# Patient Record
Sex: Male | Born: 1955
Health system: Southern US, Community
[De-identification: ages and names within clinical notes are randomized; demographics above are authoritative.]

## PROBLEM LIST (undated history)

## (undated) DIAGNOSIS — M109 Gout, unspecified: Secondary | ICD-10-CM

## (undated) DIAGNOSIS — K5790 Diverticulosis of intestine, part unspecified, without perforation or abscess without bleeding: Secondary | ICD-10-CM

## (undated) DIAGNOSIS — Z6841 Body Mass Index (BMI) 40.0 and over, adult: Secondary | ICD-10-CM

## (undated) DIAGNOSIS — R001 Bradycardia, unspecified: Secondary | ICD-10-CM

## (undated) DIAGNOSIS — I493 Ventricular premature depolarization: Secondary | ICD-10-CM

## (undated) DIAGNOSIS — I2510 Atherosclerotic heart disease of native coronary artery without angina pectoris: Secondary | ICD-10-CM

## (undated) DIAGNOSIS — D649 Anemia, unspecified: Secondary | ICD-10-CM

## (undated) DIAGNOSIS — H269 Unspecified cataract: Secondary | ICD-10-CM

## (undated) DIAGNOSIS — N521 Erectile dysfunction due to diseases classified elsewhere: Secondary | ICD-10-CM

## (undated) DIAGNOSIS — G5601 Carpal tunnel syndrome, right upper limb: Secondary | ICD-10-CM

## (undated) DIAGNOSIS — F1010 Alcohol abuse, uncomplicated: Secondary | ICD-10-CM

## (undated) DIAGNOSIS — I472 Ventricular tachycardia: Secondary | ICD-10-CM

## (undated) DIAGNOSIS — J45909 Unspecified asthma, uncomplicated: Secondary | ICD-10-CM

## (undated) DIAGNOSIS — I4729 Other ventricular tachycardia: Secondary | ICD-10-CM

## (undated) DIAGNOSIS — I10 Essential (primary) hypertension: Secondary | ICD-10-CM

## (undated) DIAGNOSIS — G4733 Obstructive sleep apnea (adult) (pediatric): Secondary | ICD-10-CM

## (undated) DIAGNOSIS — N289 Disorder of kidney and ureter, unspecified: Secondary | ICD-10-CM

## (undated) DIAGNOSIS — M86659 Other chronic osteomyelitis, unspecified thigh: Secondary | ICD-10-CM

## (undated) DIAGNOSIS — M1712 Unilateral primary osteoarthritis, left knee: Secondary | ICD-10-CM

## (undated) DIAGNOSIS — J449 Chronic obstructive pulmonary disease, unspecified: Secondary | ICD-10-CM

## (undated) DIAGNOSIS — E038 Other specified hypothyroidism: Secondary | ICD-10-CM

## (undated) DIAGNOSIS — I4819 Other persistent atrial fibrillation: Secondary | ICD-10-CM

## (undated) DIAGNOSIS — H4010X0 Unspecified open-angle glaucoma, stage unspecified: Secondary | ICD-10-CM

## (undated) DIAGNOSIS — I5032 Chronic diastolic (congestive) heart failure: Secondary | ICD-10-CM

## (undated) DIAGNOSIS — I4891 Unspecified atrial fibrillation: Secondary | ICD-10-CM

## (undated) DIAGNOSIS — G894 Chronic pain syndrome: Secondary | ICD-10-CM

## (undated) DIAGNOSIS — E291 Testicular hypofunction: Secondary | ICD-10-CM

## (undated) DIAGNOSIS — D126 Benign neoplasm of colon, unspecified: Secondary | ICD-10-CM

## (undated) DIAGNOSIS — G4730 Sleep apnea, unspecified: Secondary | ICD-10-CM

## (undated) DIAGNOSIS — M810 Age-related osteoporosis without current pathological fracture: Secondary | ICD-10-CM

## (undated) DIAGNOSIS — M86652 Other chronic osteomyelitis, left thigh: Secondary | ICD-10-CM

## (undated) DIAGNOSIS — K219 Gastro-esophageal reflux disease without esophagitis: Secondary | ICD-10-CM

## (undated) DIAGNOSIS — E785 Hyperlipidemia, unspecified: Secondary | ICD-10-CM

## (undated) DIAGNOSIS — E1140 Type 2 diabetes mellitus with diabetic neuropathy, unspecified: Secondary | ICD-10-CM

## (undated) DIAGNOSIS — K648 Other hemorrhoids: Secondary | ICD-10-CM

## (undated) DIAGNOSIS — Z79891 Long term (current) use of opiate analgesic: Secondary | ICD-10-CM

## (undated) DIAGNOSIS — M47812 Spondylosis without myelopathy or radiculopathy, cervical region: Secondary | ICD-10-CM

## (undated) DIAGNOSIS — M75101 Unspecified rotator cuff tear or rupture of right shoulder, not specified as traumatic: Secondary | ICD-10-CM

## (undated) DIAGNOSIS — M5412 Radiculopathy, cervical region: Secondary | ICD-10-CM

## (undated) DIAGNOSIS — J300 Vasomotor rhinitis: Secondary | ICD-10-CM

## (undated) DIAGNOSIS — N189 Chronic kidney disease, unspecified: Secondary | ICD-10-CM

## (undated) DIAGNOSIS — E039 Hypothyroidism, unspecified: Secondary | ICD-10-CM

## (undated) HISTORY — DX: Spondylosis without myelopathy or radiculopathy, cervical region: M47.812

## (undated) HISTORY — DX: Unspecified rotator cuff tear or rupture of right shoulder, not specified as traumatic: M75.101

## (undated) HISTORY — DX: Other persistent atrial fibrillation: I48.19

## (undated) HISTORY — DX: Sleep apnea, unspecified: G47.30

## (undated) HISTORY — DX: Atherosclerotic heart disease of native coronary artery without angina pectoris: I25.10

## (undated) HISTORY — DX: Essential (primary) hypertension: I10

## (undated) HISTORY — DX: Morbid (severe) obesity due to excess calories: E66.01

## (undated) HISTORY — DX: Diverticulosis of intestine, part unspecified, without perforation or abscess without bleeding: K57.90

## (undated) HISTORY — DX: Erectile dysfunction due to diseases classified elsewhere: N52.1

## (undated) HISTORY — DX: Gout, unspecified: M10.9

## (undated) HISTORY — DX: Benign neoplasm of colon, unspecified: D12.6

## (undated) HISTORY — DX: Unspecified atrial fibrillation: I48.91

## (undated) HISTORY — DX: Unspecified cataract: H26.9

## (undated) HISTORY — DX: Age-related osteoporosis without current pathological fracture: M81.0

## (undated) HISTORY — DX: Radiculopathy, cervical region: M54.12

## (undated) HISTORY — DX: Hyperlipidemia, unspecified: E78.5

## (undated) HISTORY — DX: Chronic diastolic (congestive) heart failure: I50.32

## (undated) HISTORY — DX: Disorder of kidney and ureter, unspecified: N28.9

## (undated) HISTORY — DX: Chronic kidney disease, unspecified: N18.9

## (undated) HISTORY — DX: Gastro-esophageal reflux disease without esophagitis: K21.9

## (undated) HISTORY — DX: Unilateral primary osteoarthritis, left knee: M17.12

## (undated) HISTORY — DX: Ventricular tachycardia: I47.2

## (undated) HISTORY — DX: Carpal tunnel syndrome, right upper limb: G56.01

## (undated) HISTORY — DX: Unspecified open-angle glaucoma, stage unspecified: H40.10X0

## (undated) HISTORY — DX: Other ventricular tachycardia: I47.29

## (undated) HISTORY — DX: Ventricular premature depolarization: I49.3

## (undated) HISTORY — DX: Type 2 diabetes mellitus with diabetic neuropathy, unspecified: E11.40

## (undated) HISTORY — PX: SHOULDER SURGERY: SHX246

## (undated) HISTORY — DX: Other chronic osteomyelitis, unspecified thigh: M86.659

## (undated) HISTORY — DX: Testicular hypofunction: E29.1

## (undated) HISTORY — DX: Chronic pain syndrome: G89.4

## (undated) HISTORY — DX: Anemia, unspecified: D64.9

## (undated) HISTORY — DX: Obstructive sleep apnea (adult) (pediatric): G47.33

## (undated) HISTORY — DX: Unspecified asthma, uncomplicated: J45.909

## (undated) HISTORY — DX: Long term (current) use of opiate analgesic: Z79.891

## (undated) HISTORY — DX: Other specified hypothyroidism: E03.8

## (undated) HISTORY — DX: Type 2 diabetes mellitus with diabetic neuropathy, unspecified: N52.1

## (undated) HISTORY — DX: Body Mass Index (BMI) 40.0 and over, adult: Z684

## (undated) HISTORY — DX: Hypothyroidism, unspecified: E03.9

## (undated) HISTORY — DX: Bradycardia, unspecified: R00.1

## (undated) HISTORY — PX: OTHER SURGICAL HISTORY: SHX169

## (undated) HISTORY — DX: Other chronic osteomyelitis, left thigh: M86.652

---
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
## 1979-05-05 HISTORY — PX: FRACTURE SURGERY: SHX138

## 2011-07-10 ENCOUNTER — Ambulatory Visit
Payer: No Typology Code available for payment source | Attending: Orthopedic Surgery | Admitting: Rehabilitative and Restorative Service Providers"

## 2011-07-10 DIAGNOSIS — M25569 Pain in unspecified knee: Secondary | ICD-10-CM | POA: Insufficient documentation

## 2011-07-10 DIAGNOSIS — R269 Unspecified abnormalities of gait and mobility: Secondary | ICD-10-CM | POA: Insufficient documentation

## 2011-07-10 DIAGNOSIS — M25669 Stiffness of unspecified knee, not elsewhere classified: Secondary | ICD-10-CM | POA: Insufficient documentation

## 2011-07-10 DIAGNOSIS — IMO0001 Reserved for inherently not codable concepts without codable children: Secondary | ICD-10-CM | POA: Insufficient documentation

## 2011-07-11 ENCOUNTER — Ambulatory Visit: Payer: No Typology Code available for payment source | Admitting: Physical Therapy

## 2011-07-23 ENCOUNTER — Ambulatory Visit: Payer: No Typology Code available for payment source | Admitting: Rehabilitation

## 2011-07-24 ENCOUNTER — Ambulatory Visit: Payer: No Typology Code available for payment source | Admitting: Rehabilitative and Restorative Service Providers"

## 2011-07-31 ENCOUNTER — Ambulatory Visit: Payer: No Typology Code available for payment source | Admitting: Rehabilitation

## 2011-08-02 ENCOUNTER — Ambulatory Visit: Payer: No Typology Code available for payment source | Admitting: Rehabilitative and Restorative Service Providers"

## 2011-09-04 DIAGNOSIS — Z87898 Personal history of other specified conditions: Secondary | ICD-10-CM | POA: Diagnosis not present

## 2011-09-11 DIAGNOSIS — I509 Heart failure, unspecified: Secondary | ICD-10-CM | POA: Diagnosis not present

## 2011-09-11 DIAGNOSIS — E291 Testicular hypofunction: Secondary | ICD-10-CM | POA: Diagnosis not present

## 2011-09-11 DIAGNOSIS — I10 Essential (primary) hypertension: Secondary | ICD-10-CM | POA: Diagnosis not present

## 2011-09-11 DIAGNOSIS — M25569 Pain in unspecified knee: Secondary | ICD-10-CM | POA: Diagnosis not present

## 2011-09-11 DIAGNOSIS — M79609 Pain in unspecified limb: Secondary | ICD-10-CM | POA: Diagnosis not present

## 2011-09-11 DIAGNOSIS — G894 Chronic pain syndrome: Secondary | ICD-10-CM | POA: Diagnosis not present

## 2011-09-11 DIAGNOSIS — G609 Hereditary and idiopathic neuropathy, unspecified: Secondary | ICD-10-CM | POA: Diagnosis not present

## 2011-09-18 ENCOUNTER — Encounter (HOSPITAL_COMMUNITY): Payer: Self-pay | Admitting: Emergency Medicine

## 2011-09-18 ENCOUNTER — Emergency Department (INDEPENDENT_AMBULATORY_CARE_PROVIDER_SITE_OTHER)
Admission: EM | Admit: 2011-09-18 | Discharge: 2011-09-18 | Disposition: A | Discharge status: Home / Self Care | Payer: Medicare Other | Source: Home / Self Care

## 2011-09-18 DIAGNOSIS — I10 Essential (primary) hypertension: Secondary | ICD-10-CM

## 2011-09-18 NOTE — ED Provider Notes (Signed)
Medical screening examination/treatment/procedure(s) were performed by non-physician practitioner and as supervising physician I was immediately available for consultation/collaboration.  Corrie Mckusick, MD 09/18/11 2057

## 2011-09-18 NOTE — ED Notes (Signed)
Pt. Stated, my Dr in [place] town said I need to see a Dr. About my BP.

## 2011-09-18 NOTE — ED Provider Notes (Signed)
History     CSN: [id number]  Arrival date & time 09/18/11  1647   First MD Initiated Contact with Patient 09/18/11 1659      Chief Complaint  Patient presents with  . Blood Pressure Check    (Consider location/radiation/quality/duration/timing/severity/associated sxs/prior treatment) Patient is a 56 y.o. male presenting with hypertension. The history is provided by the patient. No language interpreter was used.  Hypertension This is a new problem. The current episode started more than 1 week ago. The problem occurs constantly. The problem has been gradually worsening. Pertinent negatives include no chest pain, no abdominal pain, no headaches and no shortness of breath. The symptoms are aggravated by nothing. The symptoms are relieved by nothing. He has tried nothing for the symptoms. The treatment provided moderate relief.  Pt reports blood pressure has been higher this week.    Past Medical History  Diagnosis Date  . Hypertension   . Acid reflux   . Angina pectoris     No past surgical history on file.  No family history on file.  History  Substance Use Topics  . Smoking status: Not on file  . Smokeless tobacco: Not on file  . Alcohol Use:       Review of Systems  Respiratory: Negative for shortness of breath.   Cardiovascular: Negative for chest pain.  Gastrointestinal: Negative for abdominal pain.  Neurological: Negative for headaches.  All other systems reviewed and are negative.    Allergies  Altace  Home Medications   Current Outpatient Rx  Name Route Sig Dispense Refill  . ALBUTEROL SULFATE (2.5 MG/3ML) 0.083% IN NEBU Nebulization Take 2.5 mg by nebulization every 6 (six) hours as needed.    . ALLOPURINOL 100 MG PO TABS Oral Take 100 mg by mouth daily.    . ATORVASTATIN CALCIUM 40 MG PO TABS Oral Take 40 mg by mouth daily.    [name] CLONIDINE HCL 0.2 MG PO TABS Oral Take 0.2 mg by mouth 2 (two) times daily.    . COLCHICINE 0.6 MG PO TABS Oral Take 0.6 mg  by mouth daily.    [name] DICLOFENAC SODIUM 50 MG PO TBEC Oral Take 50 mg by mouth 2 (two) times daily.    [name] DIGOXIN 0.25 MG PO TABS Oral Take 250 mcg by mouth daily.    [name] ESOMEPRAZOLE MAGNESIUM 40 MG PO CPDR Oral Take 40 mg by mouth daily before breakfast.    . FUROSEMIDE 40 MG PO TABS Oral Take 40 mg by mouth daily.    [name] METOPROLOL TARTRATE 100 MG PO TABS Oral Take 100 mg by mouth 2 (two) times daily.    [name] NITROGLYCERIN 0.4 MG SL SUBL Sublingual Place 0.4 mg under the tongue every 5 (five) minutes as needed.    . OXYCODONE-ACETAMINOPHEN 10-650 MG PO TABS Oral Take 1 tablet by mouth every 6 (six) hours as needed.    . TESTOSTERONE CYPIONATE 200 MG/ML IM OIL Intramuscular Inject into the muscle every 14 (fourteen) days.    [name] VALSARTAN 80 MG PO TABS Oral Take 80 mg by mouth daily.    . WARFARIN SODIUM 6 MG PO TABS Oral Take 6 mg by mouth daily.      BP 164/97  Pulse 18  Temp(Src) 98.4 F (36.9 C) (Oral)  Resp 18  SpO2 96%  Physical Exam  Nursing note and vitals reviewed. Constitutional: He appears well-developed and well-nourished.  HENT:  Head: Normocephalic and atraumatic.  Right Ear: External ear normal.  Left Ear:  External ear normal.  Nose: Nose normal.  Eyes: Conjunctivae and EOM are normal. Pupils are equal, round, and reactive to light.  Neck: Normal range of motion. Neck supple.  Cardiovascular: Normal rate and normal heart sounds.   Pulmonary/Chest: Effort normal.  Abdominal: Soft.  Musculoskeletal: Normal range of motion.  Neurological: He is alert.  Skin: Skin is warm.  Psychiatric: He has a normal mood and affect.    ED Course  Procedures (including critical care time)  Labs Reviewed - No data to display No results found.   No diagnosis found.    MDM    Pt reports decreased activity recently,  No change in meds,  Has not missed any,  No change in diet      Langston Masker, [place] 09/18/11 1846

## 2011-09-19 ENCOUNTER — Encounter: Payer: Self-pay | Admitting: Internal Medicine

## 2011-09-20 ENCOUNTER — Encounter: Payer: Self-pay | Admitting: Cardiovascular Disease

## 2011-09-20 ENCOUNTER — Telehealth: Payer: Self-pay | Admitting: Cardiovascular Disease

## 2011-09-20 ENCOUNTER — Ambulatory Visit (INDEPENDENT_AMBULATORY_CARE_PROVIDER_SITE_OTHER): Payer: Medicare Other | Admitting: Cardiovascular Disease

## 2011-09-20 VITALS — BP 149/87 | HR 61 | Ht 71.0 in | Wt 293.0 lb

## 2011-09-20 DIAGNOSIS — L97909 Non-pressure chronic ulcer of unspecified part of unspecified lower leg with unspecified severity: Secondary | ICD-10-CM | POA: Diagnosis not present

## 2011-09-20 DIAGNOSIS — I10 Essential (primary) hypertension: Secondary | ICD-10-CM | POA: Diagnosis not present

## 2011-09-20 LAB — BASIC METABOLIC PANEL
CO2: 30 mEq/L (ref 19–32)
Calcium: 8.9 mg/dL (ref 8.4–10.5)
Chloride: 102 mEq/L (ref 96–112)
Sodium: 139 mEq/L (ref 135–145)

## 2011-09-20 MED ORDER — HYDROCHLOROTHIAZIDE 25 MG PO TABS: 25.0000 mg | ORAL_TABLET | Freq: Every day | ORAL | Status: DC

## 2011-09-20 NOTE — Patient Instructions (Signed)
Your physician wants you to follow-up in: 6 months. You will receive a reminder letter in the mail two months in advance. If you don't receive a letter, please call our office to schedule the follow-up appointment.   Your physician has recommended you make the following change in your medication: Start hydrochlorothiazide 25 mg by mouth daily.

## 2011-09-20 NOTE — Telephone Encounter (Addendum)
ROI sent to [facility] @ [phone]. 09/20/11/KM  Also faxed records to [facility]  @ [phone] 09/20/11/KM  Records Received from [facility] gave to [facility] 09/21/11/KM

## 2011-09-20 NOTE — Assessment & Plan Note (Signed)
His BP is still not controlled. This is a chronic problem followed by his cardiologist in [place], [place] and his primary care Dr. Sherril Croon in [place].  He is on multiple drugs. Diovan was increased to 160 mg po Qdaily this week in the Urgent care. I will add HCTZ 25 mg po Qdaily. Check BMET. He is a poor historian. I do not know what his cardiac conditions include. Will get records from his cardiologist in [place], [place]. If he does decide to transfer care to our office, we will need these to continue along the plan of care. ?LvEF, ?valvular disease, ? CAD.

## 2011-09-20 NOTE — Progress Notes (Signed)
History of Present Illness: 56 yo AAM with history of HTN, HLD, gout, GERD here today as a new patient for cardiac evaluation. He was seen in the [facility] on 09/18/11 for HTN. Diovan was increased to 160 mg po Qdaily and he was referred to our office for f/u He lives here and spends time with family in [place], [place] ([place]). His cardiologist is Dr. Malachi Bonds (?) in [place], [place]. He has been feeling well. No chest pain. No shortness of breath. He has occasional palpitations. This occurs several times per week for several months. He tells me that he sees his cardiologist in [place] regularly. He has had two cardiac caths in 4 years, told things were ok. ? LVEF. He is not sure. He wore a monitor last year in [place], [place].   Primary care is Dr. Alicia Amel in [place].   Past Medical History  Diagnosis Date  . Hypertension   . Acid reflux   . Angina pectoris   . Hyperlipidemia   . Asthma   . Diabetes mellitus   . Congestive heart failure     Past Surgical History  Procedure Date  . Left leg surgery   . Left arm surgery     Current Outpatient Prescriptions  Medication Sig Dispense Refill  . albuterol (PROVENTIL) (2.5 MG/3ML) 0.083% nebulizer solution Take 2.5 mg by nebulization every 6 (six) hours as needed.      [name] allopurinol (ZYLOPRIM) 100 MG tablet Take 100 mg by mouth daily.      [name] atorvastatin (LIPITOR) 40 MG tablet Take 40 mg by mouth daily.      . cloNIDine (CATAPRES) 0.2 MG tablet Take 0.2 mg by mouth 2 (two) times daily.      . colchicine 0.6 MG tablet Take 0.6 mg by mouth daily.      . diclofenac (VOLTAREN) 50 MG EC tablet Take 50 mg by mouth 2 (two) times daily.      . digoxin (LANOXIN) 0.25 MG tablet Take 250 mcg by mouth daily.      [name] esomeprazole (NEXIUM) 40 MG capsule Take 40 mg by mouth daily before breakfast.      . furosemide (LASIX) 40 MG tablet Take 40 mg by mouth daily.      . metoprolol (LOPRESSOR) 100 MG tablet Take 100 mg by mouth 2 (two)  times daily.      . nitroGLYCERIN (NITROSTAT) 0.4 MG SL tablet Place 0.4 mg under the tongue every 5 (five) minutes as needed.      [name] oxyCODONE-acetaminophen (PERCOCET) 10-650 MG per tablet Take 1 tablet by mouth every 6 (six) hours as needed.      . testosterone cypionate (DEPOTESTOTERONE CYPIONATE) 200 MG/ML injection Inject into the muscle every 14 (fourteen) days.      . valsartan (DIOVAN) 80 MG tablet Take 80 mg by mouth daily.      [name] warfarin (COUMADIN) 6 MG tablet Take 6 mg by mouth daily.        Allergies  Allergen Reactions  . Altace     History   Social History  . Marital Status: Widowed    Spouse Name: N/A    Number of Children: N/A  . Years of Education: N/A   Occupational History  . Not on file.   Social History Main Topics  . Smoking status: Never Smoker   . Smokeless tobacco: Not on file  . Alcohol Use: 1.0 oz/week    2 drink(s) per week  .  Drug Use: No  . Sexually Active: Not on file   Other Topics Concern  . Not on file   Social History Narrative  . No narrative on file    Family History  Problem Relation Age of Onset  . Heart failure Mother   . Dementia Father     Review of Systems:  As stated in the HPI and otherwise negative.   BP 149/87  Pulse 61  Ht 5\' 11"  (1.803 m)  Wt 293 lb (132.904 kg)  BMI 40.87 kg/m2  Physical Examination: General: Well developed, well nourished, NAD HEENT: OP clear, mucus membranes moist SKIN: warm, dry. No rashes. Neuro: No focal deficits Musculoskeletal: Muscle strength 5/5 all ext Psychiatric: Mood and affect normal Neck: No JVD, no carotid bruits, no thyromegaly, no lymphadenopathy. Lungs:Clear bilaterally, no wheezes, rhonci, crackles Cardiovascular: Regular rate and rhythm. No murmurs, gallops or rubs. Abdomen:Soft. Bowel sounds present. Non-tender.  Extremities: No lower extremity edema. Pulses are 2 + in the bilateral DP/PT.  EKG: NSR, rate 63 bpm. Non-specific ST and T wave changes.

## 2011-09-24 DIAGNOSIS — E7800 Pure hypercholesterolemia, unspecified: Secondary | ICD-10-CM | POA: Diagnosis not present

## 2011-09-24 DIAGNOSIS — Z125 Encounter for screening for malignant neoplasm of prostate: Secondary | ICD-10-CM | POA: Diagnosis not present

## 2011-09-24 DIAGNOSIS — E119 Type 2 diabetes mellitus without complications: Secondary | ICD-10-CM | POA: Diagnosis not present

## 2011-09-27 ENCOUNTER — Encounter: Payer: Self-pay | Admitting: *Deleted

## 2011-10-05 DIAGNOSIS — Z87898 Personal history of other specified conditions: Secondary | ICD-10-CM | POA: Diagnosis not present

## 2011-10-10 DIAGNOSIS — I10 Essential (primary) hypertension: Secondary | ICD-10-CM | POA: Diagnosis not present

## 2011-10-10 DIAGNOSIS — E291 Testicular hypofunction: Secondary | ICD-10-CM | POA: Diagnosis not present

## 2011-10-10 DIAGNOSIS — I509 Heart failure, unspecified: Secondary | ICD-10-CM | POA: Diagnosis not present

## 2011-10-10 DIAGNOSIS — Z5181 Encounter for therapeutic drug level monitoring: Secondary | ICD-10-CM | POA: Diagnosis not present

## 2011-10-10 DIAGNOSIS — E119 Type 2 diabetes mellitus without complications: Secondary | ICD-10-CM | POA: Diagnosis not present

## 2011-10-10 DIAGNOSIS — Z7901 Long term (current) use of anticoagulants: Secondary | ICD-10-CM | POA: Diagnosis not present

## 2011-10-18 DIAGNOSIS — I10 Essential (primary) hypertension: Secondary | ICD-10-CM | POA: Diagnosis not present

## 2011-10-18 DIAGNOSIS — I4891 Unspecified atrial fibrillation: Secondary | ICD-10-CM | POA: Diagnosis not present

## 2011-10-18 DIAGNOSIS — J069 Acute upper respiratory infection, unspecified: Secondary | ICD-10-CM | POA: Diagnosis not present

## 2011-10-18 DIAGNOSIS — E119 Type 2 diabetes mellitus without complications: Secondary | ICD-10-CM | POA: Diagnosis not present

## 2011-10-29 DIAGNOSIS — Z7901 Long term (current) use of anticoagulants: Secondary | ICD-10-CM | POA: Diagnosis not present

## 2011-11-02 DIAGNOSIS — Z87898 Personal history of other specified conditions: Secondary | ICD-10-CM | POA: Diagnosis not present

## 2011-11-05 DIAGNOSIS — H4011X0 Primary open-angle glaucoma, stage unspecified: Secondary | ICD-10-CM | POA: Diagnosis not present

## 2011-11-07 DIAGNOSIS — E781 Pure hyperglyceridemia: Secondary | ICD-10-CM | POA: Diagnosis not present

## 2011-11-07 DIAGNOSIS — G894 Chronic pain syndrome: Secondary | ICD-10-CM | POA: Diagnosis not present

## 2011-11-07 DIAGNOSIS — I509 Heart failure, unspecified: Secondary | ICD-10-CM | POA: Diagnosis not present

## 2011-11-07 DIAGNOSIS — M159 Polyosteoarthritis, unspecified: Secondary | ICD-10-CM | POA: Diagnosis not present

## 2011-11-07 DIAGNOSIS — E291 Testicular hypofunction: Secondary | ICD-10-CM | POA: Diagnosis not present

## 2011-11-16 DIAGNOSIS — I4891 Unspecified atrial fibrillation: Secondary | ICD-10-CM | POA: Diagnosis not present

## 2011-11-16 DIAGNOSIS — Z79899 Other long term (current) drug therapy: Secondary | ICD-10-CM | POA: Diagnosis not present

## 2011-11-26 DIAGNOSIS — I4891 Unspecified atrial fibrillation: Secondary | ICD-10-CM | POA: Diagnosis not present

## 2011-11-26 DIAGNOSIS — Z79899 Other long term (current) drug therapy: Secondary | ICD-10-CM | POA: Diagnosis not present

## 2011-11-27 DIAGNOSIS — I509 Heart failure, unspecified: Secondary | ICD-10-CM | POA: Diagnosis not present

## 2011-11-27 DIAGNOSIS — E119 Type 2 diabetes mellitus without complications: Secondary | ICD-10-CM | POA: Diagnosis not present

## 2011-11-27 DIAGNOSIS — G8921 Chronic pain due to trauma: Secondary | ICD-10-CM | POA: Diagnosis not present

## 2011-11-27 DIAGNOSIS — I4891 Unspecified atrial fibrillation: Secondary | ICD-10-CM | POA: Diagnosis not present

## 2011-11-29 ENCOUNTER — Ambulatory Visit: Payer: Medicare Other | Admitting: Physician Assistant

## 2011-12-03 DIAGNOSIS — Z87898 Personal history of other specified conditions: Secondary | ICD-10-CM | POA: Diagnosis not present

## 2011-12-04 ENCOUNTER — Encounter: Payer: Self-pay | Admitting: Physician Assistant

## 2011-12-04 ENCOUNTER — Ambulatory Visit (INDEPENDENT_AMBULATORY_CARE_PROVIDER_SITE_OTHER): Payer: Medicare Other | Admitting: Physician Assistant

## 2011-12-04 VITALS — BP 110/75 | HR 74 | Ht 71.0 in | Wt 279.4 lb

## 2011-12-04 DIAGNOSIS — N289 Disorder of kidney and ureter, unspecified: Secondary | ICD-10-CM | POA: Diagnosis not present

## 2011-12-04 DIAGNOSIS — I509 Heart failure, unspecified: Secondary | ICD-10-CM | POA: Diagnosis not present

## 2011-12-04 DIAGNOSIS — I4891 Unspecified atrial fibrillation: Secondary | ICD-10-CM | POA: Diagnosis not present

## 2011-12-04 DIAGNOSIS — I10 Essential (primary) hypertension: Secondary | ICD-10-CM

## 2011-12-04 NOTE — Progress Notes (Signed)
[address] [place], [place]  [address] Phone: [phone] Fax:  [phone]  Date:  12/04/2011   Name:  LORENA CLEARMAN       DOB:  [birth year] MRN:  [id number]  PCP:  Elizabeth Palau, NP Primary Cardiologist:  Dr. Verne Carrow  Primary Electrophysiologist:  None    History of Present Illness: KWASI JOUNG is a 56 y.o. male who returns for follow up on AFib.  He has a history of HTN, HLD, gout, GERD.  Seen by Dr. Verne Carrow as new patient in 09/2011. He was seen in the [facility] on 09/18/11 for HTN. Diovan was increased to 160 mg po Qdaily and he was referred to our office for f/u.  He lives here and spends time with family in [place], [place] ([place]  He sees a cardiologist in [place], [place]. Not sure of name.  He reports having two cardiac caths in 4 years, and told things were ok. ? LVEF. He is not sure.  He also wore a monitor last year in [place], [place].   He sees a PCP here and sees a PCP in [place], [place] ([place], [place]).  He states he has a h/o AFib and CHF.  Not sure of his LVF.  He saw Ms Dareen Piano last week and was in AFib.  He was referred back for evaluation.  He has been on coumadin for 10 years.  It sounds like he has Parox AFib.  We have never received records.  He actually sees his cardiologist in [place] in 2-3 weeks.  He notes increased DOE last week for 4 days.  He says it feels like his heart is "trying to go out of rhythm."  No chest pain.  No syncope.  No orthopnea, PND.  He has chronic LLE edema from an old injury.  He is back in NSR today.  Today, he feels back to his baseline.  Probably Class 2-2b.     Past Medical History  Diagnosis Date  . Hypertension   . Acid reflux   . Hyperlipidemia   . Asthma   . Diabetes mellitus   . Congestive heart failure   . Atrial fibrillation     coumadin managed by PCP in [facility]    Current Outpatient Prescriptions  Medication Sig Dispense  Refill  . albuterol (PROVENTIL) (2.5 MG/3ML) 0.083% nebulizer solution Take 2.5 mg by nebulization every 6 (six) hours as needed.      [name] allopurinol (ZYLOPRIM) 100 MG tablet Take 100 mg by mouth daily.      [name] atorvastatin (LIPITOR) 40 MG tablet Take 40 mg by mouth daily.      . cloNIDine (CATAPRES) 0.2 MG tablet Take 0.2 mg by mouth 2 (two) times daily.      . colchicine 0.6 MG tablet Take 0.6 mg by mouth daily.      . diclofenac (VOLTAREN) 50 MG EC tablet Take 50 mg by mouth 2 (two) times daily.      . digoxin (LANOXIN) 0.25 MG tablet Take 250 mcg by mouth daily.      [name] esomeprazole (NEXIUM) 40 MG capsule Take 40 mg by mouth daily before breakfast.      . ferrous fumarate (HEMOCYTE - 106 MG FE) 325 (106 FE) MG TABS Take 1 tablet by mouth.      . furosemide (LASIX) 40 MG tablet Take 40 mg by mouth daily.      [name] loratadine (CLARITIN) 10 MG  tablet Take 10 mg by mouth daily.      . metoprolol (LOPRESSOR) 100 MG tablet Take 100 mg by mouth 2 (two) times daily.      . misoprostol (CYTOTEC) 200 MCG tablet Take 200 mcg by mouth daily.      . nitroGLYCERIN (NITROSTAT) 0.4 MG SL tablet Place 0.4 mg under the tongue every 5 (five) minutes as needed.      [name] oxyCODONE-acetaminophen (PERCOCET) 10-650 MG per tablet Take 1 tablet by mouth every 6 (six) hours as needed.      . testosterone cypionate (DEPOTESTOTERONE CYPIONATE) 200 MG/ML injection Inject into the muscle every 14 (fourteen) days.      . valsartan (DIOVAN) 80 MG tablet Take 160 mg by mouth daily.       [name] warfarin (COUMADIN) 6 MG tablet Take 6 mg by mouth daily.        Allergies: Allergies  Allergen Reactions  . Altace   . Androgel     History  Substance Use Topics  . Smoking status: Never Smoker   . Smokeless tobacco: Not on file  . Alcohol Use: 1.0 oz/week    2 drink(s) per week     ROS:  Please see the history of present illness.   All other systems reviewed and negative.   PHYSICAL EXAM: VS:  BP 110/75  Pulse 74  Ht 5\' 11"   (1.803 m)  Wt 279 lb 6.4 oz (126.735 kg)  BMI 38.97 kg/m2 Well nourished, well developed, in no acute distress HEENT: normal Neck: no JVD Cardiac:  normal S1, S2; RRR; no murmur Lungs:  clear to auscultation bilaterally, no wheezing, rhonchi or rales Abd: soft, nontender, no hepatomegaly Ext: trace LLE edema Skin: warm and dry Neuro:  CNs 2-12 intact, no focal abnormalities noted  EKG:  NSR, HR 74, normal axis, TW inversions 2, 3, aVF, V5-6 (no change from prior).   ECG from Ms. Anderson's office reviewed and demonstrates AFib, HR 78  Labs from Ms Anderson's office 11/28/11:  BUN 25, creatinine 1.37, K 4.6, ALT 28 (HCTZ d/c'd due to elevated creatinine)  ASSESSMENT AND PLAN:  1. Atrial fibrillation  Very difficult as we do not have any records and he has, apparently, had extensive workup with a cardiologist in [place].  It sounds like he has a h/o parox AFib.  He is on coumadin.  CHADS2 score is 3.  He is in NSR today.  His rate was controlled last week.  I suspect he was symptomatic with AFib and this was why he was more SOB.  Check a TSH.  Will request records again today.  I have counseled him on the importance of getting his prior records.  He has follow up with his cardiologist in [place] later this month.  I would not change any of his Rx until we have more information.  His coumadin is managed by his PCP in [place].   2. CHF (congestive heart failure)  Not sure if this is diastolic or systolic.  Again, we need records.     3. Renal insufficiency  Check follow up bmet today.   4. HTN  Controlled.  Continue current therapy.  He can remain off of HCTZ.     Signed, Tereso Newcomer, PA-C  4:25 PM 12/04/2011

## 2011-12-04 NOTE — Patient Instructions (Signed)
Your physician recommends that you schedule a follow-up appointment in: 2 months with DR. [facility]  Your physician recommends that you return for lab work in: TODAY BMET, TSH   PLEASE CALL us TOMORROW 12/05/11 WITH THE NAME OF THE CARDIOLOGIST IN [place], [place] [phone] [address], PA-C

## 2011-12-05 ENCOUNTER — Telehealth: Payer: Self-pay | Admitting: *Deleted

## 2011-12-05 DIAGNOSIS — I10 Essential (primary) hypertension: Secondary | ICD-10-CM

## 2011-12-05 LAB — BASIC METABOLIC PANEL
BUN: 27 mg/dL — ABNORMAL HIGH (ref 6–23)
Chloride: 107 mEq/L (ref 96–112)
GFR: 54.36 mL/min — ABNORMAL LOW (ref >60.00)
Potassium: 4.8 mEq/L (ref 3.5–5.1)
Sodium: 141 mEq/L (ref 135–145)

## 2011-12-05 LAB — TSH: TSH: 2.16 u[IU]/mL (ref 0.35–5.50)

## 2011-12-05 NOTE — Telephone Encounter (Signed)
Message copied by Tarri Fuller on Wed Dec 05, 2011  5:07 PM ------      Message from: [place], [place] T      Created: Wed Dec 05, 2011  4:37 PM       Thyroid ok      Creatinine higher        - stop Voltaren        - hold Lasix         - hold Diovan        - make sure he is not taking K+        - repeat BMET in one week      Send copy of labs to PCP in [facility] [place], [place]  4:36 PM 12/05/2011

## 2011-12-05 NOTE — Telephone Encounter (Signed)
Pt notified of lab results and to d/c voltaren, to hold lasix, to hold diovan. Pt states he does not take k+ supp but does eat 2 bananas daily, I advised to pt to cut bananas down to at least 1 every other day. Pt will not be able to have repeat bmet until 4/15 due to he will be out of town all of next week. Pt gave me verbal understanding to everything today. Danielle Rankin

## 2011-12-06 ENCOUNTER — Telehealth: Payer: Self-pay | Admitting: Cardiovascular Disease

## 2011-12-06 NOTE — Telephone Encounter (Signed)
Records Were Rec Via Fx from [facility] gave to [facility] 12/06/11/Km

## 2011-12-07 DIAGNOSIS — A09 Infectious gastroenteritis and colitis, unspecified: Secondary | ICD-10-CM | POA: Diagnosis not present

## 2011-12-07 DIAGNOSIS — I4891 Unspecified atrial fibrillation: Secondary | ICD-10-CM | POA: Diagnosis not present

## 2011-12-11 DIAGNOSIS — G894 Chronic pain syndrome: Secondary | ICD-10-CM | POA: Diagnosis not present

## 2011-12-11 DIAGNOSIS — E291 Testicular hypofunction: Secondary | ICD-10-CM | POA: Diagnosis not present

## 2011-12-11 DIAGNOSIS — I10 Essential (primary) hypertension: Secondary | ICD-10-CM | POA: Diagnosis not present

## 2011-12-11 DIAGNOSIS — Z7901 Long term (current) use of anticoagulants: Secondary | ICD-10-CM | POA: Diagnosis not present

## 2011-12-11 DIAGNOSIS — I509 Heart failure, unspecified: Secondary | ICD-10-CM | POA: Diagnosis not present

## 2011-12-11 DIAGNOSIS — M159 Polyosteoarthritis, unspecified: Secondary | ICD-10-CM | POA: Diagnosis not present

## 2011-12-12 DIAGNOSIS — M25519 Pain in unspecified shoulder: Secondary | ICD-10-CM | POA: Diagnosis not present

## 2011-12-12 DIAGNOSIS — M19019 Primary osteoarthritis, unspecified shoulder: Secondary | ICD-10-CM | POA: Diagnosis not present

## 2011-12-12 DIAGNOSIS — M25819 Other specified joint disorders, unspecified shoulder: Secondary | ICD-10-CM | POA: Diagnosis not present

## 2011-12-14 DIAGNOSIS — Z7901 Long term (current) use of anticoagulants: Secondary | ICD-10-CM | POA: Diagnosis not present

## 2011-12-17 ENCOUNTER — Other Ambulatory Visit (INDEPENDENT_AMBULATORY_CARE_PROVIDER_SITE_OTHER): Payer: Medicare Other

## 2011-12-17 DIAGNOSIS — I10 Essential (primary) hypertension: Secondary | ICD-10-CM | POA: Diagnosis not present

## 2011-12-17 LAB — BASIC METABOLIC PANEL
CO2: 28 mEq/L (ref 19–32)
Chloride: 102 mEq/L (ref 96–112)
Creatinine, Ser: 1.1 mg/dL (ref 0.4–1.5)
Potassium: 3.9 mEq/L (ref 3.5–5.1)

## 2011-12-19 ENCOUNTER — Other Ambulatory Visit: Payer: Self-pay | Admitting: *Deleted

## 2011-12-19 ENCOUNTER — Telehealth: Payer: Self-pay | Admitting: *Deleted

## 2011-12-19 DIAGNOSIS — I10 Essential (primary) hypertension: Secondary | ICD-10-CM

## 2011-12-19 NOTE — Telephone Encounter (Signed)
Message copied by Tarri Fuller on Wed Dec 19, 2011  8:36 AM ------      Message from: [place], [place] T      Created: Tue Dec 18, 2011  8:22 AM       Creatinine better      Not sure how important remaining on Diovan is as we do not have records from his cardiologist in [place] (ie systolic or diastolic CHF?)      So, I would have him restart the Diovan and repeat a bmet in one week.      If he is going to remain with his cardiologist in [place], he should have this followed with them.      Are records being sent??      Tereso Newcomer, PA-C  8:22 AM 12/18/2011

## 2011-12-19 NOTE — Telephone Encounter (Signed)
pt notified of lab results and to restart diovan 160 mg daily, pt states he wants to know if he can go back on Arthrotec, says his Dr. in [place] was very upset that he was taken off of this and other meds as well, repeat bmet 4/24 here. Danielle Rankin

## 2011-12-25 DIAGNOSIS — M719 Bursopathy, unspecified: Secondary | ICD-10-CM | POA: Diagnosis not present

## 2011-12-25 DIAGNOSIS — M5030 Other cervical disc degeneration, unspecified cervical region: Secondary | ICD-10-CM | POA: Diagnosis not present

## 2011-12-25 DIAGNOSIS — M67919 Unspecified disorder of synovium and tendon, unspecified shoulder: Secondary | ICD-10-CM | POA: Diagnosis not present

## 2011-12-26 ENCOUNTER — Ambulatory Visit: Payer: Medicare Other | Attending: Sports Medicine | Admitting: Rehabilitative and Restorative Service Providers"

## 2011-12-26 ENCOUNTER — Telehealth: Payer: Self-pay | Admitting: *Deleted

## 2011-12-26 ENCOUNTER — Encounter: Payer: Self-pay | Admitting: *Deleted

## 2011-12-26 ENCOUNTER — Other Ambulatory Visit (INDEPENDENT_AMBULATORY_CARE_PROVIDER_SITE_OTHER): Payer: Medicare Other

## 2011-12-26 DIAGNOSIS — M6281 Muscle weakness (generalized): Secondary | ICD-10-CM | POA: Insufficient documentation

## 2011-12-26 DIAGNOSIS — I10 Essential (primary) hypertension: Secondary | ICD-10-CM

## 2011-12-26 DIAGNOSIS — M25519 Pain in unspecified shoulder: Secondary | ICD-10-CM | POA: Insufficient documentation

## 2011-12-26 DIAGNOSIS — IMO0001 Reserved for inherently not codable concepts without codable children: Secondary | ICD-10-CM | POA: Insufficient documentation

## 2011-12-26 LAB — BASIC METABOLIC PANEL
BUN: 11 mg/dL (ref 6–23)
CO2: 27 mEq/L (ref 19–32)
Chloride: 101 mEq/L (ref 96–112)
Glucose, Bld: 147 mg/dL — ABNORMAL HIGH (ref 70–99)
Potassium: 3.9 mEq/L (ref 3.5–5.1)
Sodium: 136 mEq/L (ref 135–145)

## 2011-12-26 NOTE — Telephone Encounter (Signed)
Message copied by Tarri Fuller on Wed Dec 26, 2011  5:17 PM ------      Message from: [place], [place] T      Created: Wed Dec 26, 2011  4:45 PM       Please notify patient that the lab results are ok.      Tereso Newcomer, PA-C  4:45 PM 12/26/2011

## 2011-12-26 NOTE — Telephone Encounter (Signed)
recording came on and said voice mail box not set up yet. I mailed out results letter today. Danielle Rankin

## 2011-12-31 ENCOUNTER — Ambulatory Visit: Payer: Medicare Other

## 2012-01-02 DIAGNOSIS — Z87898 Personal history of other specified conditions: Secondary | ICD-10-CM | POA: Diagnosis not present

## 2012-01-04 ENCOUNTER — Ambulatory Visit: Payer: Medicare Other | Attending: Sports Medicine

## 2012-01-04 DIAGNOSIS — IMO0001 Reserved for inherently not codable concepts without codable children: Secondary | ICD-10-CM | POA: Insufficient documentation

## 2012-01-04 DIAGNOSIS — M6281 Muscle weakness (generalized): Secondary | ICD-10-CM | POA: Insufficient documentation

## 2012-01-04 DIAGNOSIS — M25519 Pain in unspecified shoulder: Secondary | ICD-10-CM | POA: Diagnosis not present

## 2012-01-08 ENCOUNTER — Encounter: Payer: Medicare Other | Admitting: Rehabilitative and Restorative Service Providers"

## 2012-01-08 DIAGNOSIS — I10 Essential (primary) hypertension: Secondary | ICD-10-CM | POA: Diagnosis not present

## 2012-01-08 DIAGNOSIS — N189 Chronic kidney disease, unspecified: Secondary | ICD-10-CM | POA: Diagnosis not present

## 2012-01-08 DIAGNOSIS — G8921 Chronic pain due to trauma: Secondary | ICD-10-CM | POA: Diagnosis not present

## 2012-01-08 DIAGNOSIS — I4891 Unspecified atrial fibrillation: Secondary | ICD-10-CM | POA: Diagnosis not present

## 2012-01-09 DIAGNOSIS — Z79899 Other long term (current) drug therapy: Secondary | ICD-10-CM | POA: Diagnosis not present

## 2012-01-09 DIAGNOSIS — I4891 Unspecified atrial fibrillation: Secondary | ICD-10-CM | POA: Diagnosis not present

## 2012-01-10 ENCOUNTER — Ambulatory Visit: Payer: Medicare Other | Admitting: Rehabilitative and Restorative Service Providers"

## 2012-01-14 ENCOUNTER — Ambulatory Visit: Payer: Medicare Other | Admitting: Rehabilitative and Restorative Service Providers"

## 2012-01-17 ENCOUNTER — Ambulatory Visit: Payer: Medicare Other | Admitting: Physical Therapy

## 2012-01-18 DIAGNOSIS — I4891 Unspecified atrial fibrillation: Secondary | ICD-10-CM | POA: Diagnosis not present

## 2012-01-18 DIAGNOSIS — Z79899 Other long term (current) drug therapy: Secondary | ICD-10-CM | POA: Diagnosis not present

## 2012-01-21 ENCOUNTER — Ambulatory Visit: Payer: Medicare Other | Admitting: Physical Therapy

## 2012-01-22 DIAGNOSIS — I10 Essential (primary) hypertension: Secondary | ICD-10-CM | POA: Diagnosis not present

## 2012-01-22 DIAGNOSIS — E291 Testicular hypofunction: Secondary | ICD-10-CM | POA: Diagnosis not present

## 2012-01-22 DIAGNOSIS — I509 Heart failure, unspecified: Secondary | ICD-10-CM | POA: Diagnosis not present

## 2012-01-22 DIAGNOSIS — R609 Edema, unspecified: Secondary | ICD-10-CM | POA: Diagnosis not present

## 2012-01-23 ENCOUNTER — Ambulatory Visit: Payer: Medicare Other | Admitting: Physical Therapy

## 2012-01-29 ENCOUNTER — Encounter: Payer: Medicare Other | Admitting: Rehabilitative and Restorative Service Providers"

## 2012-01-31 ENCOUNTER — Encounter: Payer: Medicare Other | Admitting: Rehabilitative and Restorative Service Providers"

## 2012-02-02 DIAGNOSIS — Z87898 Personal history of other specified conditions: Secondary | ICD-10-CM | POA: Diagnosis not present

## 2012-02-04 ENCOUNTER — Encounter: Payer: Self-pay | Admitting: Cardiovascular Disease

## 2012-02-04 ENCOUNTER — Ambulatory Visit (INDEPENDENT_AMBULATORY_CARE_PROVIDER_SITE_OTHER): Payer: Medicare Other | Admitting: Cardiovascular Disease

## 2012-02-04 VITALS — BP 134/90 | HR 61 | Ht 71.0 in | Wt 272.0 lb

## 2012-02-04 DIAGNOSIS — I5030 Unspecified diastolic (congestive) heart failure: Secondary | ICD-10-CM | POA: Insufficient documentation

## 2012-02-04 DIAGNOSIS — I10 Essential (primary) hypertension: Secondary | ICD-10-CM | POA: Diagnosis not present

## 2012-02-04 DIAGNOSIS — I5032 Chronic diastolic (congestive) heart failure: Secondary | ICD-10-CM | POA: Insufficient documentation

## 2012-02-04 DIAGNOSIS — I4891 Unspecified atrial fibrillation: Secondary | ICD-10-CM | POA: Diagnosis not present

## 2012-02-04 MED ORDER — METOPROLOL TARTRATE 100 MG PO TABS: 100.0000 mg | ORAL_TABLET | Freq: Two times a day (BID) | ORAL | Status: DC

## 2012-02-04 MED ORDER — VALSARTAN 80 MG PO TABS: 80.0000 mg | ORAL_TABLET | Freq: Every day | ORAL | Status: DC

## 2012-02-04 MED ORDER — FUROSEMIDE 40 MG PO TABS: 40.0000 mg | ORAL_TABLET | Freq: Every day | ORAL | Status: DC

## 2012-02-04 NOTE — Assessment & Plan Note (Addendum)
He has been feeling rare palpitations. See HPI. Will increase Lopressor to 100 mg po BID. Event monitor from last year noted from outside office. Should avoid alcohol use. He is on coumadin and his INR is followed in primary care. He is asking to switch to our coumadin clinic. Will arrange.

## 2012-02-04 NOTE — Assessment & Plan Note (Signed)
Borderline elevated. Will restart Diovan but at reduced dose of 80 mg per day. Will also change Lopressor to BID dosing instead the single day dosing he has been using.

## 2012-02-04 NOTE — Patient Instructions (Signed)
Your physician recommends that you schedule a follow-up appointment in: 3 months with Dr. Lisette Grinder have been referred to the coumadin clinic in our office.  Schedule appt for later this week.  Your physician has recommended you make the following change in your medication:  Change lopressor to 100 mg by mouth twice daily. Decrease Diovan to 80 mg by mouth daily. Resume furosemide 40 mg by mouth daily.

## 2012-02-04 NOTE — Assessment & Plan Note (Signed)
Some LE edema. Will add back Lasix 40 mg po once daily.

## 2012-02-04 NOTE — Progress Notes (Signed)
History of Present Illness: 55 yo AAM with history of diastolic CHF, tobacco abuse, HTN, HLD, paroxysmal atrial fibrillation, gout, GERD here today for cardiac follow up. He was seen as a new patient for cardiac evaluation in January 2013. He was seen in the [facility] on 09/18/11 for HTN. Diovan was increased to 160 mg po Qdaily and he was referred to our office for f/u.  He lives here and spends time with family in [place], [place] ([place] His cardiologist is Dr. Charm Rings  At [facility] in [place], [place]. At the first visit with me, he told me that he had been feeling well. I had no records of his prior cardiac workup. He did describe occasional palpitations. He saw Tereso Newcomer PA-C for recurrence of atrial fib last month. We have now received records from his other cardiologist. He had an echo 6/11 showed mild LVH, EF of 55%. miild LAE.  He wore a monitor April 2012 in [place], [place] that showed NSR with episodes of sinus brady, rare PVCs, occasional PACs and brief episodes of atrial tachycardia. Per records he has been on Multaq in the past but did not like this medication so it was stopped. He had a stress myoview 09/14/05 that showed reversible ischemia in the inferior wall. This led to a cardiac cath on 10/01/05 which showed 25% mid LAD stenosis per report but no other evidence of CAD.   He is here for follow up. He has occasional palpitations. No chest pain or SOB.   Primary Care Physician: Meredith Pel, PA-C [place], [place])  Past Medical History  Diagnosis Date  . Hypertension   . Acid reflux   . Hyperlipidemia   . Asthma   . Diabetes mellitus   . Congestive heart failure   . Atrial fibrillation     coumadin managed by PCP in [facility] [place]    Past Surgical History  Procedure Date  . Left leg surgery   . Left arm surgery     Current Outpatient Prescriptions  Medication Sig Dispense Refill  . albuterol (PROVENTIL) (2.5 MG/3ML) 0.083%  nebulizer solution Take 2.5 mg by nebulization every 6 (six) hours as needed.      [name] allopurinol (ZYLOPRIM) 100 MG tablet Take 100 mg by mouth daily.      [name] atorvastatin (LIPITOR) 40 MG tablet Take 40 mg by mouth daily.      . cloNIDine (CATAPRES) 0.2 MG tablet Take 0.2 mg by mouth 2 (two) times daily.      . colchicine 0.6 MG tablet Take 0.6 mg by mouth daily.      . diclofenac (VOLTAREN) 50 MG EC tablet Take 50 mg by mouth 2 (two) times daily.      . digoxin (LANOXIN) 0.25 MG tablet Take 250 mcg by mouth daily.      [name] esomeprazole (NEXIUM) 40 MG capsule Take 40 mg by mouth daily before breakfast.      . ferrous fumarate (HEMOCYTE - 106 MG FE) 325 (106 FE) MG TABS Take 1 tablet by mouth.      . furosemide (LASIX) 40 MG tablet Take 40 mg by mouth daily.      [name] loratadine (CLARITIN) 10 MG tablet Take 10 mg by mouth daily.      . metoprolol (LOPRESSOR) 100 MG tablet Take 100 mg by mouth 2 (two) times daily.      . misoprostol (CYTOTEC) 200 MCG tablet Take 200 mcg by mouth daily.      [name]  nitroGLYCERIN (NITROSTAT) 0.4 MG SL tablet Place 0.4 mg under the tongue every 5 (five) minutes as needed.      [name] oxyCODONE-acetaminophen (PERCOCET) 10-650 MG per tablet Take 1 tablet by mouth every 6 (six) hours as needed.      . testosterone cypionate (DEPOTESTOTERONE CYPIONATE) 200 MG/ML injection Inject into the muscle every 14 (fourteen) days.      . valsartan (DIOVAN) 160 MG tablet Take 1 tablet (160 mg total) by mouth daily.      [name] warfarin (COUMADIN) 6 MG tablet Take 6 mg by mouth daily.        Allergies  Allergen Reactions  . Ramipril   . Testosterone     History   Social History  . Marital Status: Widowed    Spouse Name: N/A    Number of Children: N/A  . Years of Education: N/A   Occupational History  . Not on file.   Social History Main Topics  . Smoking status: Never Smoker   . Smokeless tobacco: Not on file  . Alcohol Use: 1.0 oz/week    2 drink(s) per week  . Drug Use: No  .  Sexually Active: Not on file   Other Topics Concern  . Not on file   Social History Narrative  . No narrative on file    Family History  Problem Relation Age of Onset  . Heart failure Mother   . Dementia Father     Review of Systems:  As stated in the HPI and otherwise negative.   BP 134/90  Pulse 61  Ht 5\' 11"  (1.803 m)  Wt 272 lb (123.378 kg)  BMI 37.94 kg/m2  Physical Examination: General: Well developed, well nourished, NAD HEENT: OP clear, mucus membranes moist SKIN: warm, dry. No rashes. Neuro: No focal deficits Musculoskeletal: Muscle strength 5/5 all ext Psychiatric: Mood and affect normal Neck: No JVD, no carotid bruits, no thyromegaly, no lymphadenopathy. Lungs:Clear bilaterally, no wheezes, rhonci, crackles Cardiovascular: Irregular,  No murmurs, gallops or rubs. Abdomen:Soft. Bowel sounds present. Non-tender.  Extremities: Trace bilateral lower extremity edema. Pulses are 1+ in the bilateral DP/PT.  ZOX:WRUEAV fibrillation, rate 71 bpm. Non-specific T wave abnormalities.

## 2012-02-08 ENCOUNTER — Ambulatory Visit (INDEPENDENT_AMBULATORY_CARE_PROVIDER_SITE_OTHER): Payer: Medicare Other | Admitting: *Deleted

## 2012-02-08 DIAGNOSIS — I4891 Unspecified atrial fibrillation: Secondary | ICD-10-CM | POA: Diagnosis not present

## 2012-02-08 DIAGNOSIS — Z7901 Long term (current) use of anticoagulants: Secondary | ICD-10-CM | POA: Diagnosis not present

## 2012-02-14 ENCOUNTER — Ambulatory Visit (INDEPENDENT_AMBULATORY_CARE_PROVIDER_SITE_OTHER): Payer: Medicare Other | Admitting: *Deleted

## 2012-02-14 DIAGNOSIS — I4891 Unspecified atrial fibrillation: Secondary | ICD-10-CM

## 2012-02-14 DIAGNOSIS — Z7901 Long term (current) use of anticoagulants: Secondary | ICD-10-CM | POA: Diagnosis not present

## 2012-02-21 ENCOUNTER — Ambulatory Visit (INDEPENDENT_AMBULATORY_CARE_PROVIDER_SITE_OTHER): Payer: Medicare Other | Admitting: *Deleted

## 2012-02-21 DIAGNOSIS — I4891 Unspecified atrial fibrillation: Secondary | ICD-10-CM | POA: Diagnosis not present

## 2012-02-21 DIAGNOSIS — Z7901 Long term (current) use of anticoagulants: Secondary | ICD-10-CM

## 2012-02-21 LAB — POCT INR: INR: 4.6

## 2012-02-25 ENCOUNTER — Emergency Department (INDEPENDENT_AMBULATORY_CARE_PROVIDER_SITE_OTHER)
Admission: EM | Admit: 2012-02-25 | Discharge: 2012-02-25 | Disposition: A | Discharge status: Home / Self Care | Payer: Medicare Other | Source: Home / Self Care

## 2012-02-25 ENCOUNTER — Encounter (HOSPITAL_COMMUNITY): Payer: Self-pay

## 2012-02-25 DIAGNOSIS — W57XXXA Bitten or stung by nonvenomous insect and other nonvenomous arthropods, initial encounter: Secondary | ICD-10-CM

## 2012-02-25 DIAGNOSIS — T148 Other injury of unspecified body region: Secondary | ICD-10-CM

## 2012-02-25 DIAGNOSIS — T148XXA Other injury of unspecified body region, initial encounter: Secondary | ICD-10-CM

## 2012-02-25 MED ORDER — HYDROXYZINE HCL 10 MG PO TABS: 10.0000 mg | ORAL_TABLET | Freq: Three times a day (TID) | ORAL | Status: AC | PRN

## 2012-02-25 MED ORDER — BACITRACIN-NEOMYCIN-POLYMYXIN 400-5-5000 EX OINT: TOPICAL_OINTMENT | Freq: Two times a day (BID) | CUTANEOUS | Status: AC

## 2012-02-25 NOTE — ED Provider Notes (Signed)
History     CSN: [id number]  Arrival date & time 02/25/12  1131   First MD Initiated Contact with Patient 02/25/12 1133      No chief complaint on file.   (Consider location/radiation/quality/duration/timing/severity/associated sxs/prior treatment) HPI  Patient is 56 year old [place] has [place] mosquito bites on his arms. He explains that it has been very itchy and he kept scratching his both arms. He reports history of similar events during the summertime last year. This year he says it's quite worse as itching is constant. He also reports he cannot sleep from itching. Patient denies fevers and chills, no chest pain or shortness of breath, no specific abdominal or urinary concerns, no systemic concerns, no weight loss or gain, no changes in appetite. Patient also denies recent sicknesses or hospitalizations, no sick contacts or exposures.  Past Medical History  Diagnosis Date  . Hypertension   . Acid reflux   . Hyperlipidemia   . Asthma   . Diabetes mellitus   . Atrial fibrillation     On coumadin  . Diastolic CHF     Normal LVEF by echo 02/2010  . Hypertensive heart disease     Mild LVH by echo 02/2010  . CAD (coronary artery disease)     25% LAD stenosis by cath 2007    Past Surgical History  Procedure Date  . Left leg surgery   . Left arm surgery     Family History  Problem Relation Age of Onset  . Heart failure Mother   . Dementia Father     History  Substance Use Topics  . Smoking status: Never Smoker   . Smokeless tobacco: Not on file  . Alcohol Use: 1.0 oz/week    2 drink(s) per week      Review of Systems  Review of Systems  Constitutional: Negative for fever, chills, diaphoresis, activity change, appetite change and fatigue.  HENT: Negative for ear pain, nosebleeds, congestion, facial swelling, rhinorrhea, neck pain, neck stiffness and ear discharge.   Eyes: Negative for pain, discharge, redness, itching and  visual disturbance.  Respiratory: Negative for cough, choking, chest tightness, shortness of breath, wheezing and stridor.   Cardiovascular: Negative for chest pain, palpitations and leg swelling.  Gastrointestinal: Negative for abdominal distention.  Genitourinary: Negative for dysuria, urgency, frequency, hematuria, flank pain, decreased urine volume, difficulty urinating and dyspareunia.  Musculoskeletal: Negative for back pain, joint swelling, arthralgias and gait problem.  Neurological: Negative for dizziness, tremors, seizures, syncope, facial asymmetry, speech difficulty, weakness, light-headedness, numbness and headaches.  Hematological: Negative for adenopathy. Does not bruise/bleed easily.  Psychiatric/Behavioral: Negative for hallucinations, behavioral problems, confusion, dysphoric mood, decreased concentration and agitation.    Allergies  Ramipril and Testosterone  Home Medications   Current Outpatient Rx  Name Route Sig Dispense Refill  . ALBUTEROL SULFATE (2.5 MG/3ML) 0.083% IN NEBU Nebulization Take 2.5 mg by nebulization every 6 (six) hours as needed.    . ALLOPURINOL 100 MG PO TABS Oral Take 100 mg by mouth daily.    . ATORVASTATIN CALCIUM 40 MG PO TABS Oral Take 40 mg by mouth daily.    [name] CLONIDINE HCL 0.2 MG PO TABS Oral Take 0.2 mg by mouth 2 (two) times daily.    . COLCHICINE 0.6 MG PO TABS Oral Take 0.6 mg by mouth daily.    [name] DICLOFENAC SODIUM 50 MG PO TBEC  50 mg 2 (two) times daily. On hold    . DIGOXIN  0.25 MG PO TABS Oral Take 250 mcg by mouth daily.    [name] ESOMEPRAZOLE MAGNESIUM 40 MG PO CPDR Oral Take 40 mg by mouth daily before breakfast.    . FENOFIBRATE 160 MG PO TABS Oral Take 160 mg by mouth daily.    [name] FERROUS FUMARATE 325 (106 FE) MG PO TABS Oral Take 1 tablet by mouth.    . FUROSEMIDE 40 MG PO TABS Oral Take 1 tablet (40 mg total) by mouth daily. 30 tablet 6  . METOPROLOL TARTRATE 100 MG PO TABS Oral Take 1 tablet (100 mg total) by mouth 2 (two) times  daily. 60 tablet 6  . MISOPROSTOL 200 MCG PO TABS  daily. ON HOLD    . NITROGLYCERIN 0.4 MG SL SUBL Sublingual Place 0.4 mg under the tongue every 5 (five) minutes as needed.    . OXYCODONE-ACETAMINOPHEN 10-650 MG PO TABS Oral Take 1 tablet by mouth every 6 (six) hours as needed.    . TESTOSTERONE CYPIONATE 200 MG/ML IM OIL  Injection 400 ml  Once a month    . VALSARTAN 80 MG PO TABS Oral Take 1 tablet (80 mg total) by mouth daily. 30 tablet 6  . WARFARIN SODIUM 6 MG PO TABS Oral Take 6 mg by mouth daily.      There were no vitals taken for this visit.  Physical Exam  Physical Exam  Constitutional: He appears well-developed and well-nourished. No distress.  Eyes: Conjunctivae and EOM are normal. Pupils are equal, round, and reactive to light. Right eye exhibits no discharge. Left eye exhibits no discharge. No scleral icterus.  Neck: Normal range of motion. Neck supple. No JVD present. No tracheal deviation present. No thyromegaly present.  Cardiovascular: Normal rate, regular rhythm, normal heart sounds and intact distal pulses.  Exam reveals no gallop and no friction rub.   Pulmonary/Chest: Effort normal and breath sounds normal. No stridor. No respiratory distress. She has no wheezes. She has no rales. She exhibits no tenderness.  Lymphadenopathy: He has no cervical adenopathy.  Skin: Skin is warm and dry. No rash noted. He is not diaphoretic. No erythema. No pallor.  2-3 mosquito bites noted on left arm and right arm with areas of scratch marks, consistent with pt's scratching, no bleeding noted  Psychiatric: He has a normal mood and affect. Behavior is normal. Judgment and thought content normal.    ED Course  Procedures (including critical care time)  Labs Reviewed - No data to display No results found.   Mosquito bites - Skin looks as patient was scratching from itching - Will prescribe hydroxyzine for symptomatic management is itching - I have encouraged patient to avoid  touching the areas while the mosquito bites are in process of healing - I also advise placing ointment on the skin to keep area moisturized    MDM  Will provide hydroxyzine prescription for symptomatic relief of itching        Dorothea Ogle, MD 02/25/12 1240

## 2012-02-25 NOTE — ED Notes (Signed)
C/o generalized itching, unresponsive to home treatment

## 2012-02-25 NOTE — Discharge Instructions (Signed)
Insect Bite Mosquitoes, flies, fleas, bedbugs, and many other insects can bite. Insect bites are different from insect stings. A sting is when venom is injected into the skin. Some insect bites can transmit infectious diseases. SYMPTOMS  Insect bites usually turn red, swell, and itch for 2 to 4 days. They often go away on their own. TREATMENT  Your caregiver may prescribe antibiotic medicines if a bacterial infection develops in the bite. HOME CARE INSTRUCTIONS  Do not scratch the bite area.   Keep the bite area clean and dry. Wash the bite area thoroughly with soap and water.   Put ice or cool compresses on the bite area.   Put ice in a plastic bag.   Place a towel between your skin and the bag.   Leave the ice on for 20 minutes, 4 times a day for the first 2 to 3 days, or as directed.   You may apply a baking soda paste, cortisone cream, or calamine lotion to the bite area as directed by your caregiver. This can help reduce itching and swelling.   Only take over-the-counter or prescription medicines as directed by your caregiver.   If you are given antibiotics, take them as directed. Finish them even if you start to feel better.  You may need a tetanus shot if:  You cannot remember when you had your last tetanus shot.   You have never had a tetanus shot.   The injury broke your skin.  If you get a tetanus shot, your arm may swell, get red, and feel warm to the touch. This is common and not a problem. If you need a tetanus shot and you choose not to have one, there is a rare chance of getting tetanus. Sickness from tetanus can be serious. SEEK IMMEDIATE MEDICAL CARE IF:   You have increased pain, redness, or swelling in the bite area.   You see a red line on the skin coming from the bite.   You have a fever.   You have joint pain.   You have a headache or neck pain.   You have unusual weakness.   You have a rash.   You have chest pain or shortness of breath.   You  have abdominal pain, nausea, or vomiting.   You feel unusually tired or sleepy.  MAKE SURE YOU:   Understand these instructions.   Will watch your condition.   Will get help right away if you are not doing well or get worse.  Document Released: 09/27/2004 Document Revised: 08/09/2011 Document Reviewed: 03/21/2011 Exit[employer] Patient Information 2012 [employer]. 

## 2012-02-28 ENCOUNTER — Ambulatory Visit (INDEPENDENT_AMBULATORY_CARE_PROVIDER_SITE_OTHER): Payer: Medicare Other | Admitting: *Deleted

## 2012-02-28 DIAGNOSIS — T148 Other injury of unspecified body region: Secondary | ICD-10-CM | POA: Diagnosis not present

## 2012-02-28 DIAGNOSIS — I4891 Unspecified atrial fibrillation: Secondary | ICD-10-CM | POA: Diagnosis not present

## 2012-02-28 DIAGNOSIS — W57XXXA Bitten or stung by nonvenomous insect and other nonvenomous arthropods, initial encounter: Secondary | ICD-10-CM | POA: Diagnosis not present

## 2012-02-28 DIAGNOSIS — Z7901 Long term (current) use of anticoagulants: Secondary | ICD-10-CM

## 2012-02-28 DIAGNOSIS — T148XXA Other injury of unspecified body region, initial encounter: Secondary | ICD-10-CM | POA: Diagnosis not present

## 2012-02-28 LAB — POCT INR: INR: 2.4

## 2012-02-29 ENCOUNTER — Telehealth: Payer: Self-pay | Admitting: *Deleted

## 2012-02-29 NOTE — Telephone Encounter (Signed)
Pt called stating that he had cortisone injection in hip yesterday  Instructed that  This injection will not affect coumadin  Was not given any new antibiotics nor oral steriods

## 2012-03-03 DIAGNOSIS — Z87898 Personal history of other specified conditions: Secondary | ICD-10-CM | POA: Diagnosis not present

## 2012-03-10 ENCOUNTER — Ambulatory Visit (INDEPENDENT_AMBULATORY_CARE_PROVIDER_SITE_OTHER): Payer: Medicare Other | Admitting: *Deleted

## 2012-03-10 DIAGNOSIS — Z7901 Long term (current) use of anticoagulants: Secondary | ICD-10-CM

## 2012-03-10 DIAGNOSIS — I4891 Unspecified atrial fibrillation: Secondary | ICD-10-CM | POA: Diagnosis not present

## 2012-03-10 LAB — POCT INR: INR: 2.1

## 2012-03-13 DIAGNOSIS — H4011X0 Primary open-angle glaucoma, stage unspecified: Secondary | ICD-10-CM | POA: Diagnosis not present

## 2012-03-14 DIAGNOSIS — I509 Heart failure, unspecified: Secondary | ICD-10-CM | POA: Diagnosis not present

## 2012-03-14 DIAGNOSIS — H4011X0 Primary open-angle glaucoma, stage unspecified: Secondary | ICD-10-CM | POA: Diagnosis not present

## 2012-03-14 DIAGNOSIS — E291 Testicular hypofunction: Secondary | ICD-10-CM | POA: Diagnosis not present

## 2012-03-14 DIAGNOSIS — G609 Hereditary and idiopathic neuropathy, unspecified: Secondary | ICD-10-CM | POA: Diagnosis not present

## 2012-03-14 DIAGNOSIS — I10 Essential (primary) hypertension: Secondary | ICD-10-CM | POA: Diagnosis not present

## 2012-04-01 ENCOUNTER — Ambulatory Visit (INDEPENDENT_AMBULATORY_CARE_PROVIDER_SITE_OTHER): Payer: Medicare Other | Admitting: *Deleted

## 2012-04-01 DIAGNOSIS — Z7901 Long term (current) use of anticoagulants: Secondary | ICD-10-CM | POA: Diagnosis not present

## 2012-04-01 DIAGNOSIS — I4891 Unspecified atrial fibrillation: Secondary | ICD-10-CM | POA: Diagnosis not present

## 2012-04-01 LAB — POCT INR: INR: 2.5

## 2012-04-03 DIAGNOSIS — Z87898 Personal history of other specified conditions: Secondary | ICD-10-CM | POA: Diagnosis not present

## 2012-04-17 DIAGNOSIS — G894 Chronic pain syndrome: Secondary | ICD-10-CM | POA: Diagnosis not present

## 2012-04-17 DIAGNOSIS — E291 Testicular hypofunction: Secondary | ICD-10-CM | POA: Diagnosis not present

## 2012-04-17 DIAGNOSIS — E119 Type 2 diabetes mellitus without complications: Secondary | ICD-10-CM | POA: Diagnosis not present

## 2012-04-17 DIAGNOSIS — E669 Obesity, unspecified: Secondary | ICD-10-CM | POA: Diagnosis not present

## 2012-04-30 ENCOUNTER — Telehealth: Payer: Self-pay | Admitting: *Deleted

## 2012-04-30 ENCOUNTER — Ambulatory Visit (INDEPENDENT_AMBULATORY_CARE_PROVIDER_SITE_OTHER): Payer: Medicare Other | Admitting: Pharmacist

## 2012-04-30 DIAGNOSIS — I4891 Unspecified atrial fibrillation: Secondary | ICD-10-CM | POA: Diagnosis not present

## 2012-04-30 DIAGNOSIS — Z7901 Long term (current) use of anticoagulants: Secondary | ICD-10-CM

## 2012-04-30 LAB — POCT INR: INR: 3.6

## 2012-04-30 NOTE — Telephone Encounter (Signed)
[facility] [employer] has been contacted and they will do welfare check on pt and ask him to contact our office

## 2012-04-30 NOTE — Telephone Encounter (Signed)
Walk in pt form completed that pt complained of being dizzy for 2 weeks, nausea and Left arm pain for one month. Also inability to catch breath. Pt then went to coumadin clinic appt. He did not mention these complaints to coumadin clinic nurse.  He had been told to stop by front desk prior to leaving office.  Triage nurse went out to speak with pt after she received walk in form but pt not in office.  I tried to reach pt on number listed for him bu it is not accepting calls. Number for son is disconnected. I called and spoke with pt's primary care provider to see if they had additional numbers listed for pt.  I was given [phone] as contact number for pt. I called this number and received message that it is not a working number. His sister, Valerie Roys, was also  Listed at primary care as a contact. Number for sister is [phone]. I tried this number but it is disconnected.

## 2012-04-30 NOTE — Telephone Encounter (Signed)
Police have not been able to locate pt. I have tried number listed for pt again and number is not accepting calls

## 2012-05-01 NOTE — Telephone Encounter (Signed)
Pt rtn call to Pat from yesterday °

## 2012-05-01 NOTE — Telephone Encounter (Signed)
Spoke with pt who states has had some CP, with Larm radiation,nausea, and diphoresis--doing better lately--when advised that he shouild go to  ED or come to office pt states he has an appoint with dr Clifton James on 9/3 and if his CP gets worse go to nearest ED

## 2012-05-04 DIAGNOSIS — Z87898 Personal history of other specified conditions: Secondary | ICD-10-CM | POA: Diagnosis not present

## 2012-05-06 ENCOUNTER — Encounter: Payer: Self-pay | Admitting: Cardiovascular Disease

## 2012-05-06 ENCOUNTER — Ambulatory Visit (INDEPENDENT_AMBULATORY_CARE_PROVIDER_SITE_OTHER): Payer: Medicare Other | Admitting: Cardiovascular Disease

## 2012-05-06 VITALS — BP 93/68 | HR 58 | Resp 18 | Ht 71.0 in | Wt 273.0 lb

## 2012-05-06 DIAGNOSIS — I25810 Atherosclerosis of coronary artery bypass graft(s) without angina pectoris: Secondary | ICD-10-CM

## 2012-05-06 DIAGNOSIS — I4891 Unspecified atrial fibrillation: Secondary | ICD-10-CM | POA: Diagnosis not present

## 2012-05-06 DIAGNOSIS — R079 Chest pain, unspecified: Secondary | ICD-10-CM

## 2012-05-06 DIAGNOSIS — I10 Essential (primary) hypertension: Secondary | ICD-10-CM | POA: Diagnosis not present

## 2012-05-06 MED ORDER — METOPROLOL TARTRATE 50 MG PO TABS: 50.0000 mg | ORAL_TABLET | Freq: Two times a day (BID) | ORAL | Status: DC

## 2012-05-06 NOTE — Progress Notes (Signed)
History of Present Illness: 56 yo AAM with history of diastolic CHF, tobacco abuse, HTN, HLD, paroxysmal atrial fibrillation, gout, GERD here today for cardiac follow up. He was seen as a new patient for cardiac evaluation in January 2013 and most recently for f/u in June 2013. He was seen in the [facility] on 09/18/11 for HTN. Diovan was increased to 160 mg po Qdaily and he was referred to our office for f/u. He lives here and spends time with family in [place], [place] ([place]). His cardiologist is Dr. Charm Rings At [facility] in [place], [place]. At the first visit with me, he told me that he had been feeling well. He had an echo 6/11 showed mild LVH, EF of 55%. miild LAE. He wore a monitor April 2012 in [place], [place] that showed NSR with episodes of sinus brady, rare PVCs, occasional PACs and brief episodes of atrial tachycardia. Per records he has been on Multaq in the past but did not like this medication so it was stopped. He had a stress myoview 09/14/05 that showed reversible ischemia in the inferior wall. This led to a cardiac cath on 10/01/05 which showed 25% mid LAD stenosis per report but no other evidence of CAD.   He is here for follow up. He describes pain in his right arm for one month with movement, makes his arm ache and makes his right hand go numb. He has also noticed pain in his left arm when he lays on it. He has had SOB and chest tightness when exerting himself. He has no energy. He also describes dizziness but no syncope.   Primary Care Physician: Meredith Pel, PA-C [facility], [place])  Lipid Panel : Followed in primary care.     Past Medical History  Diagnosis Date  . Hypertension   . Acid reflux   . Hyperlipidemia   . Asthma   . Diabetes mellitus   . Atrial fibrillation     On coumadin  . Diastolic CHF     Normal LVEF by echo 02/2010  . Hypertensive heart disease     Mild LVH by echo 02/2010  . CAD (coronary artery disease)     25% LAD  stenosis by cath 2007    Past Surgical History  Procedure Date  . Left leg surgery   . Left arm surgery     Current Outpatient Prescriptions  Medication Sig Dispense Refill  . albuterol (PROVENTIL) (2.5 MG/3ML) 0.083% nebulizer solution Take 2.5 mg by nebulization every 6 (six) hours as needed.      [name] allopurinol (ZYLOPRIM) 100 MG tablet Take 100 mg by mouth daily.      [name] atorvastatin (LIPITOR) 40 MG tablet Take 40 mg by mouth daily.      . cloNIDine (CATAPRES) 0.2 MG tablet Take 0.2 mg by mouth 2 (two) times daily.      . colchicine 0.6 MG tablet Take 0.6 mg by mouth daily.      . diclofenac (VOLTAREN) 50 MG EC tablet 50 mg 2 (two) times daily. On hold      . digoxin (LANOXIN) 0.25 MG tablet Take 250 mcg by mouth daily.      [name] esomeprazole (NEXIUM) 40 MG capsule Take 40 mg by mouth daily before breakfast.      . fenofibrate 160 MG tablet Take 160 mg by mouth daily.      . ferrous fumarate (HEMOCYTE - 106 MG FE) 325 (106 FE) MG TABS Take 1  tablet by mouth.      . furosemide (LASIX) 40 MG tablet Take 1 tablet (40 mg total) by mouth daily.  30 tablet  6  . metoprolol (LOPRESSOR) 100 MG tablet Take 1 tablet (100 mg total) by mouth 2 (two) times daily.  60 tablet  6  . misoprostol (CYTOTEC) 200 MCG tablet daily. ON HOLD      . nitroGLYCERIN (NITROSTAT) 0.4 MG SL tablet Place 0.4 mg under the tongue every 5 (five) minutes as needed.      [name] oxyCODONE-acetaminophen (PERCOCET) 10-650 MG per tablet Take 1 tablet by mouth every 6 (six) hours as needed.      . testosterone cypionate (DEPOTESTOTERONE CYPIONATE) 200 MG/ML injection Injection 400 ml  Once a month      . valsartan (DIOVAN) 80 MG tablet Take 1 tablet (80 mg total) by mouth daily.  30 tablet  6  . warfarin (COUMADIN) 6 MG tablet Take 6 mg by mouth daily.        Allergies  Allergen Reactions  . Ramipril   . Testosterone     History   Social History  . Marital Status: Widowed    Spouse Name: N/A    Number of Children: N/A  .  Years of Education: N/A   Occupational History  . Not on file.   Social History Main Topics  . Smoking status: Never Smoker   . Smokeless tobacco: Not on file  . Alcohol Use: 1.0 oz/week    2 drink(s) per week  . Drug Use: No  . Sexually Active: Not on file   Other Topics Concern  . Not on file   Social History Narrative  . No narrative on file    Family History  Problem Relation Age of Onset  . Heart failure Mother   . Dementia Father     Review of Systems:  As stated in the HPI and otherwise negative.   BP 93/68  Pulse 58  Resp 18  Ht 5\' 11"  (1.803 m)  Wt 273 lb (123.832 kg)  BMI 38.08 kg/m2  SpO2 97%  Physical Examination: General: Well developed, well nourished, NAD HEENT: OP clear, mucus membranes moist SKIN: warm, dry. No rashes. Neuro: No focal deficits Musculoskeletal: Muscle strength 5/5 all ext Psychiatric: Mood and affect normal Neck: No JVD, no carotid bruits, no thyromegaly, no lymphadenopathy. Lungs:Clear bilaterally, no wheezes, rhonci, crackles Cardiovascular: Irregular, brady.  No murmurs, gallops or rubs. Abdomen:Soft. Bowel sounds present. Non-tender.  Extremities: No lower extremity edema. Pulses are 2 + in the bilateral DP/PT.  EKG: Atrial fib with rate of 49 bpm. Poor R wave progression. Non-specific T wave abnormality  Assessment and Plan:   1. CAD/chest pain: History of non-obstructive disease by cath 2007. Recent chest pain/SOB and fatigue concerning for angina. Will arrange Lexiscan myoview to exclude ischemia. Pt cannot walk on treadmill secondary to knee pain. Will lower metoprolol to 50 mg po BID. I am hopeful that allowing his HR and BP to drift up with help him feel better. No other changes.   2. Atrial fibrillation: Rate controlled. Continue beta blocker and coumadin.   3. HTN: BP is low this am. Will lower his metoprolol dose as above.

## 2012-05-06 NOTE — Patient Instructions (Signed)
Your physician recommends that you schedule a follow-up appointment in: 3 weeks.   Your physician has requested that you have a lexiscan myoview. For further information please visit [email or web address] Please follow instruction sheet, as given.   Your physician has recommended you make the following change in your medication:Decrease metoprolol to 50 mg by mouth twice daily

## 2012-05-06 NOTE — Telephone Encounter (Signed)
I will see him this am. cdm

## 2012-05-16 DIAGNOSIS — M79609 Pain in unspecified limb: Secondary | ICD-10-CM | POA: Diagnosis not present

## 2012-05-16 DIAGNOSIS — G894 Chronic pain syndrome: Secondary | ICD-10-CM | POA: Diagnosis not present

## 2012-05-16 DIAGNOSIS — G609 Hereditary and idiopathic neuropathy, unspecified: Secondary | ICD-10-CM | POA: Diagnosis not present

## 2012-05-16 DIAGNOSIS — E119 Type 2 diabetes mellitus without complications: Secondary | ICD-10-CM | POA: Diagnosis not present

## 2012-05-16 DIAGNOSIS — E291 Testicular hypofunction: Secondary | ICD-10-CM | POA: Diagnosis not present

## 2012-05-20 ENCOUNTER — Ambulatory Visit (INDEPENDENT_AMBULATORY_CARE_PROVIDER_SITE_OTHER): Payer: Medicare Other | Admitting: *Deleted

## 2012-05-20 ENCOUNTER — Ambulatory Visit (HOSPITAL_COMMUNITY): Payer: Medicare Other | Attending: Cardiovascular Disease | Admitting: Radiology

## 2012-05-20 VITALS — BP 103/78 | Ht 71.0 in | Wt 273.0 lb

## 2012-05-20 DIAGNOSIS — J45909 Unspecified asthma, uncomplicated: Secondary | ICD-10-CM | POA: Insufficient documentation

## 2012-05-20 DIAGNOSIS — I4891 Unspecified atrial fibrillation: Secondary | ICD-10-CM

## 2012-05-20 DIAGNOSIS — R42 Dizziness and giddiness: Secondary | ICD-10-CM | POA: Insufficient documentation

## 2012-05-20 DIAGNOSIS — R0609 Other forms of dyspnea: Secondary | ICD-10-CM | POA: Insufficient documentation

## 2012-05-20 DIAGNOSIS — I2510 Atherosclerotic heart disease of native coronary artery without angina pectoris: Secondary | ICD-10-CM | POA: Insufficient documentation

## 2012-05-20 DIAGNOSIS — F17200 Nicotine dependence, unspecified, uncomplicated: Secondary | ICD-10-CM | POA: Insufficient documentation

## 2012-05-20 DIAGNOSIS — R0989 Other specified symptoms and signs involving the circulatory and respiratory systems: Secondary | ICD-10-CM | POA: Insufficient documentation

## 2012-05-20 DIAGNOSIS — I10 Essential (primary) hypertension: Secondary | ICD-10-CM | POA: Insufficient documentation

## 2012-05-20 DIAGNOSIS — I509 Heart failure, unspecified: Secondary | ICD-10-CM | POA: Insufficient documentation

## 2012-05-20 DIAGNOSIS — Z7901 Long term (current) use of anticoagulants: Secondary | ICD-10-CM

## 2012-05-20 DIAGNOSIS — R079 Chest pain, unspecified: Secondary | ICD-10-CM | POA: Diagnosis not present

## 2012-05-20 DIAGNOSIS — R0789 Other chest pain: Secondary | ICD-10-CM | POA: Diagnosis not present

## 2012-05-20 DIAGNOSIS — I25810 Atherosclerosis of coronary artery bypass graft(s) without angina pectoris: Secondary | ICD-10-CM

## 2012-05-20 LAB — POCT INR: INR: 2.2

## 2012-05-20 MED ORDER — REGADENOSON 0.4 MG/5ML IV SOLN
0.4000 mg | Freq: Once | INTRAVENOUS | Status: AC
  Administered 2012-05-20: 0.4 mg via INTRAVENOUS

## 2012-05-20 MED ORDER — TECHNETIUM TC 99M SESTAMIBI GENERIC - CARDIOLITE
33.0000 | Freq: Once | INTRAVENOUS | Status: AC | PRN
  Administered 2012-05-20: 33 via INTRAVENOUS

## 2012-05-20 MED ORDER — TECHNETIUM TC 99M SESTAMIBI GENERIC - CARDIOLITE
11.0000 | Freq: Once | INTRAVENOUS | Status: AC | PRN
  Administered 2012-05-20: 11 via INTRAVENOUS

## 2012-05-20 NOTE — Progress Notes (Signed)
[facility] SITE 3 NUCLEAR MED [address] [place] [address] [phone]  Cardiology Nuclear Med Study  Juan Calderon is a 56 y.o. male     MRN : [id number]     DOB: [birth year]  Procedure Date: 05/20/2012  Nuclear Med Background Indication for Stress Test:  Evaluation for Ischemia History:  Asthma, AFIB, CHF, 12/07 MPS: (+) inferior ischemia-cath-heart cath N/O CAD 25 % mid LAD 6/11 EF: 55% mild LVH Cardiac Risk Factors: Hypertension, Lipids and Smoker  Symptoms:  Chest Tightness with Exertion, Dizziness, DOE and Fatigue   Nuclear Pre-Procedure Caffeine/Decaff Intake:  None NPO After: 10:00pm   Lungs:  clear O2 Sat: 97% on room air. IV 0.9% NS with Angio Cath:  20g  IV Site: R Antecubital  IV Started by:  Stanton Kidney, EMT-P  Chest Size (in):  46 Cup Size: n/a  Height: 5\' 11"  (1.803 m)  Weight:  273 lb (123.832 kg)  BMI:  Body mass index is 38.08 kg/(m^2). Tech Comments:  Meds were taken as directed at 7:30am, per patient.    Nuclear Med Study 1 or 2 day study: 1 day  Stress Test Type:  Eugenie Birks  Reading MD: Charlton Haws, MD  Order Authorizing Provider:  C.McAlhany  Resting Radionuclide: Technetium 69m Sestamibi  Resting Radionuclide Dose: 11.0 mCi   Stress Radionuclide:  Technetium 74m Sestamibi  Stress Radionuclide Dose: 33.0 mCi           Stress Protocol Rest HR: 47 Stress HR: 86  Rest BP: 103/78 Stress BP: 123/74  Exercise Time (min): n/a METS: n/a   Predicted Max HR: 163 bpm % Max HR: 52.76 bpm Rate Pressure Product: 95284   Dose of Adenosine (mg):  n/a Dose of Lexiscan: 0.4 mg  Dose of Atropine (mg): n/a Dose of Dobutamine: n/a mcg/kg/min (at max HR)  Stress Test Technologist: Milana Na, EMT-P  Nuclear Technologist:  Domenic Polite, CNMT     Rest Procedure:  Myocardial perfusion imaging was performed at rest 45 minutes following the intravenous administration of Technetium 76m Sestamibi. Rest ECG: Atrial  Fibrilliation  Stress Procedure:  The patient received IV Lexiscan 0.4 mg over 15-seconds.  Technetium 27m Sestamibi injected at 30-seconds.  There were no significant changes, + sob ,abdominal pain, and rare pvcs with Lexiscan.  Quantitative spect images were obtained after a 45 minute delay. Stress ECG: No significant change from baseline ECG  QPS Raw Data Images:  Normal; no motion artifact; normal heart/lung ratio. Stress Images:  Normal homogeneous uptake in all areas of the myocardium. Rest Images:  Normal homogeneous uptake in all areas of the myocardium. Subtraction (SDS):  Normal Transient Ischemic Dilatation (Normal <1.22):  1.18 Lung/Heart Ratio (Normal <0.45):  0.39  Quantitative Gated Spect Images QGS EDV:  NA QGS ESV:  NA  Impression Exercise Capacity:  Lexiscan with no exercise. BP Response:  Normal blood pressure response. Clinical Symptoms:  There is dyspnea. ECG Impression:  No significant ST segment change suggestive of ischemia. Comparison with Prior Nuclear Study: No images to compare  Overall Impression:  Normal stress nuclear study.  No gating  Baseline ECG with afib and inferolateral T wave changes  Appears to be LVE  LV Ejection Fraction: Study not gated.  LV Wall Motion:  NA   Charlton Haws

## 2012-05-27 ENCOUNTER — Other Ambulatory Visit: Payer: Self-pay | Admitting: Sports Medicine

## 2012-05-27 DIAGNOSIS — M25511 Pain in right shoulder: Secondary | ICD-10-CM

## 2012-05-27 DIAGNOSIS — M5412 Radiculopathy, cervical region: Secondary | ICD-10-CM

## 2012-05-27 DIAGNOSIS — S43429A Sprain of unspecified rotator cuff capsule, initial encounter: Secondary | ICD-10-CM | POA: Diagnosis not present

## 2012-05-29 ENCOUNTER — Encounter: Payer: Self-pay | Admitting: Cardiovascular Disease

## 2012-05-29 ENCOUNTER — Ambulatory Visit (INDEPENDENT_AMBULATORY_CARE_PROVIDER_SITE_OTHER): Payer: Medicare Other | Admitting: Cardiovascular Disease

## 2012-05-29 VITALS — BP 130/78 | HR 49 | Ht 71.0 in | Wt 275.0 lb

## 2012-05-29 DIAGNOSIS — I2510 Atherosclerotic heart disease of native coronary artery without angina pectoris: Secondary | ICD-10-CM | POA: Diagnosis not present

## 2012-05-29 DIAGNOSIS — I4891 Unspecified atrial fibrillation: Secondary | ICD-10-CM

## 2012-05-29 DIAGNOSIS — I10 Essential (primary) hypertension: Secondary | ICD-10-CM | POA: Diagnosis not present

## 2012-05-29 MED ORDER — METOPROLOL TARTRATE 25 MG PO TABS
25.0000 mg | ORAL_TABLET | Freq: Two times a day (BID) | ORAL | Status: DC
Start: 2012-05-29 — End: 2012-06-06

## 2012-05-29 NOTE — Patient Instructions (Signed)
Your physician wants you to follow-up in:  6 months. You will receive a reminder letter in the mail two months in advance. If you don't receive a letter, please call our office to schedule the follow-up appointment.  Your physician has recommended you make the following change in your medication:  Decrease metoprolol tartrate to 25 mg by mouth twice daily

## 2012-05-29 NOTE — Progress Notes (Signed)
History of Present Illness: 56 yo AAM with history of diastolic CHF, tobacco abuse, HTN, HLD, paroxysmal atrial fibrillation, gout, GERD here today for cardiac follow up. He was seen as a new patient for cardiac evaluation in January 2013 and most recently for f/u in September 2013. He was seen in the [facility] on 09/17/10 for HTN. Diovan was increased to 160 mg po Qdaily and he was referred to our office for f/u. He lives here and spends time with family in [place], [place] ([place] He had been followed by Dr. Charm Rings [facility] in [place], [place]. At the first visit with me, he told me that he had been feeling well. He had an echo 6/11 showed mild LVH, EF of 55%. miild LAE. He wore a monitor April 2012 in [place], [place] that showed NSR with episodes of sinus brady, rare PVCs, occasional PACs and brief episodes of atrial tachycardia. Per records he has been on Multaq in the past but did not like this medication so it was stopped. He had a stress myoview 09/14/05 that showed reversible ischemia in the inferior wall. This led to a cardiac cath on 10/01/05 which showed 25% mid LAD stenosis per report but no other evidence of CAD.   He is here for follow up. He continues to have some pain in his right arm and shoulder and has seen orthopedics. He has had SOB but this is unchanged. No chest tightness.  He has no energy. Lopressor lowered from 100 mg po BID to 50 mg po BID at last visit.   Primary Care Physician: Meredith Pel, PA-C [facility], [place])   Lipid Panel : Followed in primary care.    Past Medical History  Diagnosis Date  . Hypertension   . Acid reflux   . Hyperlipidemia   . Asthma   . Diabetes mellitus   . Atrial fibrillation     On coumadin  . Diastolic CHF     Normal LVEF by echo 02/2010  . Hypertensive heart disease     Mild LVH by echo 02/2010  . CAD (coronary artery disease)     25% LAD stenosis by cath 2007    Past Surgical History    Procedure Date  . Left leg surgery   . Left arm surgery     Current Outpatient Prescriptions  Medication Sig Dispense Refill  . albuterol (PROVENTIL) (2.5 MG/3ML) 0.083% nebulizer solution Take 2.5 mg by nebulization every 6 (six) hours as needed.      [name] allopurinol (ZYLOPRIM) 100 MG tablet Take 100 mg by mouth daily.      [name] atorvastatin (LIPITOR) 40 MG tablet Take 40 mg by mouth daily.      . cloNIDine (CATAPRES) 0.2 MG tablet Take 0.2 mg by mouth 2 (two) times daily.      . colchicine 0.6 MG tablet Take 0.6 mg by mouth daily.      . diclofenac (VOLTAREN) 50 MG EC tablet 50 mg 2 (two) times daily. On hold      . digoxin (LANOXIN) 0.25 MG tablet Take 250 mcg by mouth daily.      [name] esomeprazole (NEXIUM) 40 MG capsule Take 40 mg by mouth daily before breakfast.      . fenofibrate 160 MG tablet Take 160 mg by mouth daily.      . ferrous fumarate (HEMOCYTE - 106 MG FE) 325 (106 FE) MG TABS Take 1 tablet by mouth.      [name]  furosemide (LASIX) 40 MG tablet Take 1 tablet (40 mg total) by mouth daily.  30 tablet  6  . metoprolol (LOPRESSOR) 50 MG tablet Take 1 tablet (50 mg total) by mouth 2 (two) times daily.  60 tablet  6  . misoprostol (CYTOTEC) 200 MCG tablet daily. ON HOLD      . nitroGLYCERIN (NITROSTAT) 0.4 MG SL tablet Place 0.4 mg under the tongue every 5 (five) minutes as needed.      [name] oxyCODONE-acetaminophen (PERCOCET) 10-650 MG per tablet Take 1 tablet by mouth every 6 (six) hours as needed.      . testosterone cypionate (DEPOTESTOTERONE CYPIONATE) 200 MG/ML injection Injection 400 ml  Once a month      . valsartan (DIOVAN) 80 MG tablet Take 1 tablet (80 mg total) by mouth daily.  30 tablet  6  . warfarin (COUMADIN) 6 MG tablet Take 5 mg by mouth daily.         Allergies  Allergen Reactions  . Ramipril   . Testosterone     History   Social History  . Marital Status: Widowed    Spouse Name: N/A    Number of Children: N/A  . Years of Education: N/A   Occupational History   . Not on file.   Social History Main Topics  . Smoking status: Never Smoker   . Smokeless tobacco: Not on file  . Alcohol Use: 1.0 oz/week    2 drink(s) per week  . Drug Use: No  . Sexually Active: Not on file   Other Topics Concern  . Not on file   Social History Narrative  . No narrative on file    Family History  Problem Relation Age of Onset  . Heart failure Mother   . Dementia Father     Review of Systems:  As stated in the HPI and otherwise negative.   BP 130/78  Pulse 49  Ht 5\' 11"  (1.803 m)  Wt 275 lb (124.739 kg)  BMI 38.35 kg/m2  Physical Examination: General: Well developed, well nourished, NAD HEENT: OP clear, mucus membranes moist SKIN: warm, dry. No rashes. Neuro: No focal deficits Musculoskeletal: Muscle strength 5/5 all ext Psychiatric: Mood and affect normal Neck: No JVD, no carotid bruits, no thyromegaly, no lymphadenopathy. Lungs:Clear bilaterally, no wheezes, rhonci, crackles Cardiovascular:Brady,  Regular rate and rhythm. No murmurs, gallops or rubs. Abdomen:Soft. Bowel sounds present. Non-tender.  Extremities: No lower extremity edema. Pulses are 2 + in the bilateral DP/PT.  EKG: Atrial fibrillation with rate 49 bpm. Non-specific ST abnormalities.   Lexiscan Stress myoview 05/20/12:  Stress Procedure: The patient received IV Lexiscan 0.4 mg over 15-seconds. Technetium 42m Sestamibi injected at 30-seconds. There were no significant changes, + sob ,abdominal pain, and rare pvcs with Lexiscan. Quantitative spect images were obtained after a 45 minute delay.  Stress ECG: No significant change from baseline ECG  QPS  Raw Data Images: Normal; no motion artifact; normal heart/lung ratio.  Stress Images: Normal homogeneous uptake in all areas of the myocardium.  Rest Images: Normal homogeneous uptake in all areas of the myocardium.  Subtraction (SDS): Normal  Transient Ischemic Dilatation (Normal <1.22): 1.18  Lung/Heart Ratio (Normal <0.45): 0.39   Quantitative Gated Spect Images  QGS EDV: NA  QGS ESV: NA  Impression  Exercise Capacity: Lexiscan with no exercise.  BP Response: Normal blood pressure response.  Clinical Symptoms: There is dyspnea.  ECG Impression: No significant ST segment change suggestive of ischemia.  Comparison with Prior  Nuclear Study: No images to compare  Overall Impression: Normal stress nuclear study. No gating Baseline ECG with afib and inferolateral T wave changes Appears to be LVE  LV Ejection Fraction: Study not gated. LV Wall Motion: NA  Assessment and Plan:   1. CAD/chest pain: History of non-obstructive disease by cath 2007. Recent chest pain/SOB and fatigue concerning for angina.Lexiscan myoview with no ischemia on 05/20/12. Beta blocker lowered at last visit.  Continue statin. He is not on an ASA since he is on coumadin.   2. Atrial fibrillation: Rate controlled. Continue beta blocker, digoxin and coumadin. Will lower dose of Lopressor today to 25 mg po BID with bradycardia and fatigue.   3. HTN: BP controlled.

## 2012-05-31 ENCOUNTER — Ambulatory Visit
Admission: RE | Admit: 2012-05-31 | Discharge: 2012-05-31 | Disposition: A | Discharge status: Home / Self Care | Payer: Medicare Other | Source: Ambulatory Visit | Attending: Sports Medicine | Admitting: Sports Medicine

## 2012-05-31 DIAGNOSIS — M19019 Primary osteoarthritis, unspecified shoulder: Secondary | ICD-10-CM | POA: Diagnosis not present

## 2012-05-31 DIAGNOSIS — M25519 Pain in unspecified shoulder: Secondary | ICD-10-CM | POA: Diagnosis not present

## 2012-05-31 DIAGNOSIS — M5020 Other cervical disc displacement, unspecified cervical region: Secondary | ICD-10-CM | POA: Diagnosis not present

## 2012-05-31 DIAGNOSIS — M25511 Pain in right shoulder: Secondary | ICD-10-CM

## 2012-05-31 DIAGNOSIS — M5412 Radiculopathy, cervical region: Secondary | ICD-10-CM

## 2012-06-03 DIAGNOSIS — Z87898 Personal history of other specified conditions: Secondary | ICD-10-CM | POA: Diagnosis not present

## 2012-06-05 ENCOUNTER — Telehealth: Payer: Self-pay | Admitting: *Deleted

## 2012-06-05 DIAGNOSIS — M5106 Intervertebral disc disorders with myelopathy, lumbar region: Secondary | ICD-10-CM | POA: Diagnosis not present

## 2012-06-05 DIAGNOSIS — S43429A Sprain of unspecified rotator cuff capsule, initial encounter: Secondary | ICD-10-CM | POA: Diagnosis not present

## 2012-06-05 NOTE — Telephone Encounter (Signed)
Pt came in office today and walk in patient form completed. Pt wanted to let us know that he has not seen any changes since dose of metoprolol was lowered. Call back number pt left is [phone]. I called and spoke with pt who reports he lowered dose of metoprolol to 25 twice daily as instructed at last office visit with Dr. Clifton James. He has noticed no change in symptoms.  Still with no energy, has shortness of breath with exertion (worse when bending over). No chest pain. Does not know heart rate or blood pressure.  Will review with Dr. Clifton James

## 2012-06-05 NOTE — Telephone Encounter (Signed)
Can we have him stop his Lopressor? Lets see how he feels off of the beta blocker. cdm

## 2012-06-06 ENCOUNTER — Telehealth: Payer: Self-pay | Admitting: Cardiovascular Disease

## 2012-06-06 DIAGNOSIS — G894 Chronic pain syndrome: Secondary | ICD-10-CM | POA: Diagnosis not present

## 2012-06-06 DIAGNOSIS — E291 Testicular hypofunction: Secondary | ICD-10-CM | POA: Diagnosis not present

## 2012-06-06 DIAGNOSIS — M159 Polyosteoarthritis, unspecified: Secondary | ICD-10-CM | POA: Diagnosis not present

## 2012-06-06 DIAGNOSIS — I509 Heart failure, unspecified: Secondary | ICD-10-CM | POA: Diagnosis not present

## 2012-06-06 NOTE — Telephone Encounter (Signed)
Walk in pt Form " Medication Metoprolol Not Working" Sent to [facility] 06/06/12/Km

## 2012-06-06 NOTE — Telephone Encounter (Signed)
Pt notified.  He will let us know if he continues to have symptoms after stopping Lopressor.

## 2012-06-12 DIAGNOSIS — S43429A Sprain of unspecified rotator cuff capsule, initial encounter: Secondary | ICD-10-CM | POA: Diagnosis not present

## 2012-06-16 ENCOUNTER — Telehealth: Payer: Self-pay | Admitting: Cardiovascular Disease

## 2012-06-16 DIAGNOSIS — M5030 Other cervical disc degeneration, unspecified cervical region: Secondary | ICD-10-CM | POA: Diagnosis not present

## 2012-06-16 DIAGNOSIS — M5412 Radiculopathy, cervical region: Secondary | ICD-10-CM | POA: Diagnosis not present

## 2012-06-16 NOTE — Telephone Encounter (Signed)
New problem:  Need cardiac clearance to come off coumadin  - epidural injection.  Appt is 10/25.

## 2012-06-16 NOTE — Telephone Encounter (Signed)
Spoke with Juan Calderon at [facility].  They would like to schedule pt for epidural injection for back pain. To be done by Dr. Maurice Small. Pt would need to be off coumadin for 5-7 days prior to procedure and would need to have INR checked day prior to procedure.  Will forward this request to Dr. Clifton James for review

## 2012-06-16 NOTE — Telephone Encounter (Signed)
Spoke with [facility] office and told them I would fax this note to [facility]. I confirmed fax number of [phone]. Will also make [facility] aware.

## 2012-06-16 NOTE — Telephone Encounter (Signed)
It will be ok for him to hold his coumadin prior to the procedure and restart when safe after the procedure. Earney Hamburg

## 2012-06-26 ENCOUNTER — Ambulatory Visit (INDEPENDENT_AMBULATORY_CARE_PROVIDER_SITE_OTHER): Payer: Medicare Other | Admitting: *Deleted

## 2012-06-26 DIAGNOSIS — I4891 Unspecified atrial fibrillation: Secondary | ICD-10-CM

## 2012-06-26 DIAGNOSIS — Z7901 Long term (current) use of anticoagulants: Secondary | ICD-10-CM | POA: Diagnosis not present

## 2012-06-26 LAB — POCT INR: INR: 1.1

## 2012-06-27 DIAGNOSIS — M5030 Other cervical disc degeneration, unspecified cervical region: Secondary | ICD-10-CM | POA: Diagnosis not present

## 2012-06-27 DIAGNOSIS — M5412 Radiculopathy, cervical region: Secondary | ICD-10-CM | POA: Diagnosis not present

## 2012-07-04 DIAGNOSIS — Z87898 Personal history of other specified conditions: Secondary | ICD-10-CM | POA: Diagnosis not present

## 2012-07-08 ENCOUNTER — Ambulatory Visit (INDEPENDENT_AMBULATORY_CARE_PROVIDER_SITE_OTHER): Payer: Medicare Other

## 2012-07-08 DIAGNOSIS — I4891 Unspecified atrial fibrillation: Secondary | ICD-10-CM | POA: Diagnosis not present

## 2012-07-08 DIAGNOSIS — Z7901 Long term (current) use of anticoagulants: Secondary | ICD-10-CM | POA: Diagnosis not present

## 2012-07-08 LAB — POCT INR: INR: 2

## 2012-07-09 DIAGNOSIS — I4891 Unspecified atrial fibrillation: Secondary | ICD-10-CM | POA: Diagnosis not present

## 2012-07-09 DIAGNOSIS — I509 Heart failure, unspecified: Secondary | ICD-10-CM | POA: Diagnosis not present

## 2012-07-09 DIAGNOSIS — E291 Testicular hypofunction: Secondary | ICD-10-CM | POA: Diagnosis not present

## 2012-07-09 DIAGNOSIS — D492 Neoplasm of unspecified behavior of bone, soft tissue, and skin: Secondary | ICD-10-CM | POA: Diagnosis not present

## 2012-07-09 DIAGNOSIS — G894 Chronic pain syndrome: Secondary | ICD-10-CM | POA: Diagnosis not present

## 2012-07-09 DIAGNOSIS — M159 Polyosteoarthritis, unspecified: Secondary | ICD-10-CM | POA: Diagnosis not present

## 2012-07-18 ENCOUNTER — Other Ambulatory Visit: Payer: Self-pay | Admitting: Cardiovascular Disease

## 2012-07-18 DIAGNOSIS — I5030 Unspecified diastolic (congestive) heart failure: Secondary | ICD-10-CM

## 2012-07-18 DIAGNOSIS — I10 Essential (primary) hypertension: Secondary | ICD-10-CM

## 2012-07-18 MED ORDER — VALSARTAN 80 MG PO TABS: 80.0000 mg | ORAL_TABLET | Freq: Every day | ORAL | Status: DC

## 2012-08-03 DIAGNOSIS — Z87898 Personal history of other specified conditions: Secondary | ICD-10-CM | POA: Diagnosis not present

## 2012-08-06 ENCOUNTER — Ambulatory Visit (INDEPENDENT_AMBULATORY_CARE_PROVIDER_SITE_OTHER): Payer: Medicare Other | Admitting: *Deleted

## 2012-08-06 DIAGNOSIS — Z7901 Long term (current) use of anticoagulants: Secondary | ICD-10-CM | POA: Diagnosis not present

## 2012-08-06 DIAGNOSIS — I4891 Unspecified atrial fibrillation: Secondary | ICD-10-CM | POA: Diagnosis not present

## 2012-08-06 LAB — POCT INR: INR: 1.4

## 2012-08-21 DIAGNOSIS — M67919 Unspecified disorder of synovium and tendon, unspecified shoulder: Secondary | ICD-10-CM | POA: Diagnosis not present

## 2012-08-21 DIAGNOSIS — M719 Bursopathy, unspecified: Secondary | ICD-10-CM | POA: Diagnosis not present

## 2012-08-21 DIAGNOSIS — M24119 Other articular cartilage disorders, unspecified shoulder: Secondary | ICD-10-CM | POA: Diagnosis not present

## 2012-08-21 DIAGNOSIS — M75120 Complete rotator cuff tear or rupture of unspecified shoulder, not specified as traumatic: Secondary | ICD-10-CM | POA: Diagnosis not present

## 2012-08-21 DIAGNOSIS — M25819 Other specified joint disorders, unspecified shoulder: Secondary | ICD-10-CM | POA: Diagnosis not present

## 2012-08-23 ENCOUNTER — Emergency Department (HOSPITAL_COMMUNITY): Payer: Medicare Other

## 2012-08-23 ENCOUNTER — Encounter (HOSPITAL_COMMUNITY): Payer: Self-pay | Admitting: *Deleted

## 2012-08-23 ENCOUNTER — Emergency Department (HOSPITAL_COMMUNITY)
Admission: EM | Admit: 2012-08-23 | Discharge: 2012-08-23 | Disposition: A | Discharge status: Home / Self Care | Payer: Medicare Other | Attending: Emergency Medicine | Admitting: Emergency Medicine

## 2012-08-23 DIAGNOSIS — Z79899 Other long term (current) drug therapy: Secondary | ICD-10-CM | POA: Diagnosis not present

## 2012-08-23 DIAGNOSIS — I2510 Atherosclerotic heart disease of native coronary artery without angina pectoris: Secondary | ICD-10-CM | POA: Diagnosis not present

## 2012-08-23 DIAGNOSIS — I119 Hypertensive heart disease without heart failure: Secondary | ICD-10-CM | POA: Diagnosis not present

## 2012-08-23 DIAGNOSIS — Z7901 Long term (current) use of anticoagulants: Secondary | ICD-10-CM | POA: Insufficient documentation

## 2012-08-23 DIAGNOSIS — I4891 Unspecified atrial fibrillation: Secondary | ICD-10-CM | POA: Insufficient documentation

## 2012-08-23 DIAGNOSIS — J45909 Unspecified asthma, uncomplicated: Secondary | ICD-10-CM | POA: Insufficient documentation

## 2012-08-23 DIAGNOSIS — R609 Edema, unspecified: Secondary | ICD-10-CM | POA: Insufficient documentation

## 2012-08-23 DIAGNOSIS — I5030 Unspecified diastolic (congestive) heart failure: Secondary | ICD-10-CM | POA: Insufficient documentation

## 2012-08-23 DIAGNOSIS — R0989 Other specified symptoms and signs involving the circulatory and respiratory systems: Secondary | ICD-10-CM | POA: Diagnosis not present

## 2012-08-23 DIAGNOSIS — R799 Abnormal finding of blood chemistry, unspecified: Secondary | ICD-10-CM | POA: Diagnosis not present

## 2012-08-23 DIAGNOSIS — D696 Thrombocytopenia, unspecified: Secondary | ICD-10-CM | POA: Insufficient documentation

## 2012-08-23 DIAGNOSIS — E119 Type 2 diabetes mellitus without complications: Secondary | ICD-10-CM | POA: Diagnosis not present

## 2012-08-23 DIAGNOSIS — R7989 Other specified abnormal findings of blood chemistry: Secondary | ICD-10-CM

## 2012-08-23 DIAGNOSIS — K219 Gastro-esophageal reflux disease without esophagitis: Secondary | ICD-10-CM | POA: Insufficient documentation

## 2012-08-23 DIAGNOSIS — E785 Hyperlipidemia, unspecified: Secondary | ICD-10-CM | POA: Diagnosis not present

## 2012-08-23 LAB — CBC WITH DIFFERENTIAL/PLATELET
Basophils Absolute: 0 10*3/uL (ref 0.0–0.1)
Basophils Relative: 0 % (ref 0–1)
HCT: 46.6 % (ref 39.0–52.0)
Lymphocytes Relative: 20 % (ref 12–46)
MCHC: 31.5 g/dL (ref 30.0–36.0)
Monocytes Absolute: 0.4 10*3/uL (ref 0.1–1.0)
Neutro Abs: 4 10*3/uL (ref 1.7–7.7)
Platelets: 127 10*3/uL — ABNORMAL LOW (ref 150–400)
RDW: 13.1 % (ref 11.5–15.5)
WBC: 5.7 10*3/uL (ref 4.0–10.5)

## 2012-08-23 LAB — DIGOXIN LEVEL: Digoxin Level: 0.6 ng/mL — ABNORMAL LOW (ref 0.8–2.0)

## 2012-08-23 LAB — BASIC METABOLIC PANEL
CO2: 30 mEq/L (ref 19–32)
Calcium: 9.2 mg/dL (ref 8.4–10.5)
Chloride: 99 mEq/L (ref 96–112)
Creatinine, Ser: 1.05 mg/dL (ref 0.50–1.35)
GFR calc Af Amer: 89 mL/min — ABNORMAL LOW (ref >90)
Sodium: 139 mEq/L (ref 135–145)

## 2012-08-23 LAB — TROPONIN I: Troponin I: <0.3 ng/mL (ref <0.30)

## 2012-08-23 MED ORDER — METOLAZONE 2.5 MG PO TABS: 2.5000 mg | ORAL_TABLET | Freq: Every day | ORAL | Status: DC

## 2012-08-23 MED ORDER — FUROSEMIDE 10 MG/ML IJ SOLN
40.0000 mg | Freq: Once | INTRAMUSCULAR | Status: AC
  Administered 2012-08-23: 40 mg via INTRAVENOUS
  Filled 2012-08-23: qty 4

## 2012-08-23 MED ORDER — POTASSIUM CHLORIDE CRYS ER 20 MEQ PO TBCR
40.0000 meq | EXTENDED_RELEASE_TABLET | Freq: Once | ORAL | Status: AC
  Administered 2012-08-23: 40 meq via ORAL
  Filled 2012-08-23: qty 2

## 2012-08-23 NOTE — ED Provider Notes (Signed)
History     CSN: [id number]  Arrival date & time 08/23/12  0546   First MD Initiated Contact with Patient 08/23/12 [phone]      Chief Complaint  Patient presents with  . Leg Swelling    (Consider location/radiation/quality/duration/timing/severity/associated sxs/prior treatment) HPI 56 year old male with a past medical history significant for congestive heart failure and atrial fibrillation presents today with chief complaint of increased leg swelling.  Patient is followed at low bowel or cardiology by Dr. Camillo Flaming Swelling has been slowly increasing over the past month.  Patient states that he has been taking his Lasix as prescribed.  Patient has had intermittent shortness of breath and orthopnea.  He is status post right shoulder arthroscopy x1 week performed by Dr. Dion Saucier. Patient denies any changes in his diet.  He denies any chest pain or pressure.  He denies any wheezing or chest tightness.  He denies history of DVT or pulmonary embolism.  Patient has a history of trauma to the left lower extremity and swelling is worse on that leg.  Patient denies visual disturbance, confusion, N/V, loss of appetite or symptoms of digoxin toxicity  Denies fevers, chills, myalgias, arthralgias. Denies DOE, SOB, chest tightness or pressure, radiation to left arm, jaw or back, or diaphoresis. Denies dysuria, flank pain, suprapubic pain, frequency, urgency, or hematuria. Denies headaches, light headedness, weakness, visual disturbances. Denies abdominal pain, nausea, vomiting, diarrhea or constipation.   Past Medical History  Diagnosis Date  . Hypertension   . Acid reflux   . Hyperlipidemia   . Asthma   . Diabetes mellitus   . Atrial fibrillation     On coumadin  . Diastolic CHF     Normal LVEF by echo 02/2010  . Hypertensive heart disease     Mild LVH by echo 02/2010  . CAD (coronary artery disease)     25% LAD stenosis by cath 2007    Past Surgical History  Procedure Date  . Left leg  surgery   . Left arm surgery   . Shoulder surgery     Right    Family History  Problem Relation Age of Onset  . Heart failure Mother   . Dementia Father     History  Substance Use Topics  . Smoking status: Never Smoker   . Smokeless tobacco: Never Used  . Alcohol Use: 1.0 oz/week    2 drink(s) per week      Review of Systems Ten systems reviewed and are negative for acute change, except as noted in the HPI. ]  Allergies  Ramipril and Testosterone  Home Medications   Current Outpatient Rx  Name  Route  Sig  Dispense  Refill  . ALBUTEROL SULFATE (2.5 MG/3ML) 0.083% IN NEBU   Nebulization   Take 2.5 mg by nebulization every 6 (six) hours as needed.         . ALLOPURINOL 100 MG PO TABS   Oral   Take 100 mg by mouth daily.         . ATORVASTATIN CALCIUM 40 MG PO TABS   Oral   Take 40 mg by mouth daily.         [name] CLONIDINE HCL 0.2 MG PO TABS   Oral   Take 0.2 mg by mouth 2 (two) times daily.         . COLCHICINE 0.6 MG PO TABS   Oral   Take 0.6 mg by mouth daily.         [name] DICLOFENAC  SODIUM 50 MG PO TBEC      50 mg 2 (two) times daily. On hold         . DIGOXIN 0.25 MG PO TABS   Oral   Take 250 mcg by mouth daily.         [name] ESOMEPRAZOLE MAGNESIUM 40 MG PO CPDR   Oral   Take 40 mg by mouth daily before breakfast.         . FENOFIBRATE 160 MG PO TABS   Oral   Take 160 mg by mouth daily.         [name] FERROUS FUMARATE 325 (106 FE) MG PO TABS   Oral   Take 1 tablet by mouth.         . FUROSEMIDE 40 MG PO TABS   Oral   Take 1 tablet (40 mg total) by mouth daily.   30 tablet   6   . MISOPROSTOL 200 MCG PO TABS      daily. ON HOLD         . NITROGLYCERIN 0.4 MG SL SUBL   Sublingual   Place 0.4 mg under the tongue every 5 (five) minutes as needed.         . OXYCODONE-ACETAMINOPHEN 10-650 MG PO TABS   Oral   Take 1 tablet by mouth every 6 (six) hours as needed.         . TESTOSTERONE CYPIONATE 200 MG/ML IM OIL       Injection 400 ml  Once a month         . VALSARTAN 80 MG PO TABS   Oral   Take 1 tablet (80 mg total) by mouth daily.   90 tablet   3   . WARFARIN SODIUM 6 MG PO TABS   Oral   Take 5 mg by mouth daily.            BP 157/94  Pulse 89  Temp 97.8 F (36.6 C)  Resp 16  SpO2 95%  Physical Exam Physical Exam  Nursing note and vitals reviewed. Constitutional: He is obese and chronically ill appearing. No distress.  HENT:  Head: Normocephalic and atraumatic.  Eyes: Conjunctivae normal are normal. No scleral icterus.  Neck: Normal range of motion. Neck supple.  Cardiovascular: Normal rate, irregularly irregular  Rhythm, intermittent palpitations.  2+ peripheral edema right leg, 3+ left leg with multiple old, well healed scars. Pulmonary/Chest: Effort normal and breath sounds normal. No respiratory distress.  Abdominal: Soft. Obese, distended, umbilical hernia. There is no tenderness.  Musculoskeletal: He exhibits no edema.  Neurological: He is alert.  Skin: Skin is warm and dry. He is not diaphoretic.  Psychiatric: His behavior is normal.    ED Course  Procedures (including critical care time)  Labs Reviewed  CBC WITH DIFFERENTIAL - Abnormal; Notable for the following:    Platelets 127 (*)     All other components within normal limits  BASIC METABOLIC PANEL - Abnormal; Notable for the following:    Glucose, Bld 141 (*)     GFR calc non Af Amer 76 (*)     GFR calc Af Amer 89 (*)     All other components within normal limits  DIGOXIN LEVEL - Abnormal; Notable for the following:    Digoxin Level 0.6 (*)     All other components within normal limits  PRO B NATRIURETIC PEPTIDE - Abnormal; Notable for the following:    Pro B Natriuretic peptide (BNP) 362.4 (*)  All other components within normal limits  TROPONIN I  PROTIME-INR  MAGNESIUM   No results found.   Date: 08/23/2012  Rate: 90  Rhythm: atrial fibrillation  QRS Axis: normal  Intervals: normal  ST/T  Wave abnormalities: nonspecific T wave changes  Conduction Disutrbances:none  Narrative Interpretation:   Old EKG Reviewed: unchanged    1. Peripheral edema   2. Elevated brain natriuretic peptide (BNP) level   3. Thrombocytopenia       MDM  6:40 AM BP 157/94  Pulse 89  Temp 97.8 F (36.6 C)  Resp 16  SpO2 95%  Patient with worsening peripheral edema. May have CHF exacerbation vs dig tooxicity. Labs pending. Patient resting comfortably.    9:34 AM Spoke with Dr. Milas Kocher who asked to double lasix dose.  Will d/c with two days of metolazone. Follow up with cardiologist within the next 48 hours. Supportive care discussed and return precautions.  Arthor Captain, PA-C 08/23/12 1033  Arthor Captain, PA-C 08/23/12 1034  Arthor Captain, PA-C 08/29/12 1736

## 2012-08-23 NOTE — ED Notes (Signed)
Pt coming from home with c/o leg swelling. Pt reports swelling started about a week ago. Pt denies chest pain, shortness of breath. Hx of CHF. A&Ox4, skin warm and dry, respirations equal and unlabored. Pt states he has been taking his lasix as prescribed but swelling is getting worse each day

## 2012-08-23 NOTE — ED Notes (Signed)
Patient transported to X-ray 

## 2012-08-23 NOTE — Progress Notes (Signed)
Orthopedic Tech Progress Note Patient Details:  Juan Calderon [birth year] [id number] Ace wraps applied bilaterally to LE to reduce edema.  Ortho Devices Type of Ortho Device: Ace wrap Ortho Device/Splint Interventions: Application   Asia R Thompson 08/23/2012, 9:19 AM

## 2012-08-25 ENCOUNTER — Telehealth: Payer: Self-pay | Admitting: Cardiovascular Disease

## 2012-08-25 NOTE — Telephone Encounter (Signed)
PT WAS IN ER FOR C/O SWELLING TO LEG  APPT MADE WITH [facility]  09-09-12 AT 2:20 PT AWARE THAT'S EARLIEST APPT AVAILABLE  INSTRUCTED IF  GETS WORSE NEEDS   TO FOLLOW UP WITH PMD OR GO TO ER  .Zack Seal

## 2012-08-25 NOTE — Telephone Encounter (Signed)
New Problem:    Called in wanting the patient seen tomorrow. Please call back.

## 2012-08-28 ENCOUNTER — Ambulatory Visit (INDEPENDENT_AMBULATORY_CARE_PROVIDER_SITE_OTHER): Payer: Medicare Other | Admitting: *Deleted

## 2012-08-28 DIAGNOSIS — Z7901 Long term (current) use of anticoagulants: Secondary | ICD-10-CM | POA: Diagnosis not present

## 2012-08-28 DIAGNOSIS — I4891 Unspecified atrial fibrillation: Secondary | ICD-10-CM | POA: Diagnosis not present

## 2012-08-31 NOTE — Telephone Encounter (Signed)
Can we call Juan Calderon to check on his leg swelling? Thanks, chris

## 2012-09-01 NOTE — Telephone Encounter (Signed)
Pt. request that I speak with his girlfriend, Johnny. Bethann Berkshire states that the pt's leg "is much better" and that the swelling is nearly gone. She states that the pt. has no complaints at this time.

## 2012-09-02 NOTE — ED Provider Notes (Signed)
Medical screening examination/treatment/procedure(s) were performed by non-physician practitioner and as supervising physician I was immediately available for consultation/collaboration.   Sajad Glander, MD 09/02/12 1403 

## 2012-09-03 DIAGNOSIS — Z87898 Personal history of other specified conditions: Secondary | ICD-10-CM | POA: Diagnosis not present

## 2012-09-04 ENCOUNTER — Ambulatory Visit (INDEPENDENT_AMBULATORY_CARE_PROVIDER_SITE_OTHER): Payer: Medicare Other | Admitting: *Deleted

## 2012-09-04 DIAGNOSIS — I4891 Unspecified atrial fibrillation: Secondary | ICD-10-CM | POA: Diagnosis not present

## 2012-09-04 DIAGNOSIS — Z7901 Long term (current) use of anticoagulants: Secondary | ICD-10-CM

## 2012-09-04 LAB — POCT INR: INR: 1.5

## 2012-09-05 ENCOUNTER — Other Ambulatory Visit: Payer: Self-pay | Admitting: Cardiovascular Disease

## 2012-09-09 ENCOUNTER — Encounter: Payer: Self-pay | Admitting: Physician Assistant

## 2012-09-09 ENCOUNTER — Ambulatory Visit (INDEPENDENT_AMBULATORY_CARE_PROVIDER_SITE_OTHER): Payer: Medicare Other | Admitting: Physician Assistant

## 2012-09-09 VITALS — BP 122/74 | HR 74 | Ht 71.0 in | Wt 277.1 lb

## 2012-09-09 DIAGNOSIS — I5032 Chronic diastolic (congestive) heart failure: Secondary | ICD-10-CM | POA: Diagnosis not present

## 2012-09-09 DIAGNOSIS — M1990 Unspecified osteoarthritis, unspecified site: Secondary | ICD-10-CM

## 2012-09-09 DIAGNOSIS — I4891 Unspecified atrial fibrillation: Secondary | ICD-10-CM | POA: Diagnosis not present

## 2012-09-09 DIAGNOSIS — R0602 Shortness of breath: Secondary | ICD-10-CM

## 2012-09-09 DIAGNOSIS — I10 Essential (primary) hypertension: Secondary | ICD-10-CM

## 2012-09-09 LAB — BASIC METABOLIC PANEL
CO2: 31 mEq/L (ref 19–32)
Glucose, Bld: 102 mg/dL — ABNORMAL HIGH (ref 70–99)
Potassium: 4.1 mEq/L (ref 3.5–5.1)
Sodium: 140 mEq/L (ref 135–145)

## 2012-09-09 LAB — BRAIN NATRIURETIC PEPTIDE: Pro B Natriuretic peptide (BNP): 56 pg/mL (ref 0.0–100.0)

## 2012-09-09 MED ORDER — MISOPROSTOL 200 MCG PO TABS: 200.0000 ug | ORAL_TABLET | Freq: Every day | ORAL | Status: DC

## 2012-09-09 NOTE — Patient Instructions (Addendum)
A PRESCRIPTION FOR CYTOTEC 200 MG 1 TABLET DAILY WAS SENT IN TODAY; YOU WILL NEED TO CONTACT YOUR PRIMARY CARE PHYSICAN FOR FURTHER REFILLS  LABS TODAY; BMET, BNP  PLEASE FOLLOW UP WITH DR. Clifton James IN 3 MONTHS  WEIGH DAILY AND IF YOU SEE YOUR WEIGHT INCREASE > 3 LB'S IN ONE DAY OR 5 LB'S IN 1 WEEK, MORE SHORT OF BREATH OR MORE SWELLING; PLEASE CALL [phone] SCOTT WEAVER, PAC   FOLLOW A 2 GRAM SODIUM DIET;  2 Gram Low Sodium Diet A 2 gram sodium diet restricts the amount of sodium in the diet to no more than 2 g or 2000 mg daily. Limiting the amount of sodium is often used to help lower blood pressure. It is important if you have heart, liver, or kidney problems. Many foods contain sodium for flavor and sometimes as a preservative. When the amount of sodium in a diet needs to be low, it is important to know what to look for when choosing foods and drinks. The following includes some information and guidelines to help make it easier for you to adapt to a low sodium diet. QUICK TIPS  Do not add salt to food.  Avoid convenience items and fast food.  Choose unsalted snack foods.  Buy lower sodium products, often labeled as "lower sodium" or "no salt added."  Check food labels to learn how much sodium is in 1 serving.  When eating at a restaurant, ask that your food be prepared with less salt or none, if possible. READING FOOD LABELS FOR SODIUM INFORMATION The nutrition facts label is a good place to find how much sodium is in foods. Look for products with no more than 500 to 600 mg of sodium per meal and no more than 150 mg per serving. Remember that 2 g = 2000 mg. The food label may also list foods as:  Sodium-free: Less than 5 mg in a serving.  Very low sodium: 35 mg or less in a serving.  Low-sodium: 140 mg or less in a serving.  Light in sodium: 50% less sodium in a serving. For example, if a food that usually has 300 mg of sodium is changed to become light in sodium, it will have  150 mg of sodium.  Reduced sodium: 25% less sodium in a serving. For example, if a food that usually has 400 mg of sodium is changed to reduced sodium, it will have 300 mg of sodium. CHOOSING FOODS Grains  Avoid: Salted crackers and snack items. Some cereals, including instant hot cereals. Bread stuffing and biscuit mixes. Seasoned rice or pasta mixes.  Choose: Unsalted snack items. Low-sodium cereals, oats, puffed wheat and rice, shredded wheat. English muffins and bread. Pasta. Meats  Avoid: Salted, canned, smoked, spiced, pickled meats, including fish and poultry. Bacon, ham, sausage, cold cuts, hot dogs, anchovies.  Choose: Low-sodium canned tuna and salmon. Fresh or frozen meat, poultry, and fish. Dairy  Avoid: Processed cheese and spreads. Cottage cheese. Buttermilk and condensed milk. Regular cheese.  Choose: Milk. Low-sodium cottage cheese. Yogurt. Sour cream. Low-sodium cheese. Fruits and Vegetables  Avoid: Regular canned vegetables. Regular canned tomato sauce and paste. Frozen vegetables in sauces. Olives. Rosita Fire. Relishes. Sauerkraut.  Choose: Low-sodium canned vegetables. Low-sodium tomato sauce and paste. Frozen or fresh vegetables. Fresh and frozen fruit. Condiments  Avoid: Canned and packaged gravies. Worcestershire sauce. Tartar sauce. Barbecue sauce. Soy sauce. Steak sauce. Ketchup. Onion, garlic, and table salt. Meat flavorings and tenderizers.  Choose: Fresh and dried herbs and  spices. Low-sodium varieties of mustard and ketchup. Lemon juice. Tabasco sauce. Horseradish. SAMPLE 2 GRAM SODIUM MEAL PLAN Breakfast / Sodium (mg)  1 cup low-fat milk / 143 mg  2 slices whole-wheat toast / 270 mg  1 tbs heart-healthy margarine / 153 mg  1 hard-boiled egg / 139 mg  1 small orange / 0 mg Lunch / Sodium (mg)  1 cup raw carrots / 76 mg   cup hummus / 298 mg  1 cup low-fat milk / 143 mg   cup red grapes / 2 mg  1 whole-wheat pita bread / 356 mg Dinner /  Sodium (mg)  1 cup whole-wheat pasta / 2 mg  1 cup low-sodium tomato sauce / 73 mg  3 oz lean ground beef / 57 mg  1 small side salad (1 cup raw spinach leaves,  cup cucumber,  cup yellow bell pepper) with 1 tsp olive oil and 1 tsp red wine vinegar / 25 mg Snack / Sodium (mg)  1 container low-fat vanilla yogurt / 107 mg  3 graham cracker squares / 127 mg Nutrient Analysis  Calories: 2033  Protein: 77 g  Carbohydrate: 282 g  Fat: 72 g  Sodium: 1971 mg Document Released: 08/20/2005 Document Revised: 11/12/2011 Document Reviewed: 11/21/2009 [facility] Patient Information 2013 [place], [place].

## 2012-09-09 NOTE — Progress Notes (Signed)
[address] [place], [place]  [address] Phone: [phone]; Fax:  [phone]  Date:  09/09/2012   Name:  Juan Calderon  DOB:  Dec 11, 1955 MRN:  [id number]  PCP:  Elizabeth Palau, NP Primary Cardiologist:  Dr. Verne Carrow  Primary Electrophysiologist:  None    History of Present Illness: Juan Calderon is a 57 y.o. male who returns for follow up after a recent trip to the ED for edema.  He has a hx of diastolic CHF, paroxysmal AFib, tobacco abuse, HTN, HL, gout, GERD.  Seen by Dr. Verne Carrow as new patient in 09/2011. He was seen in the [facility] on 09/18/11 for HTN. Diovan was increased to 160 mg po Qdaily and he was referred to our office for f/u.  He lives here and spends time with family in [place], [place] ([place]).  He had been followed by Dr. Charm Rings At [facility] in [place], [place].  Echo 6/11:  mild LVH, EF of 55%. miild LAE. Monitor April 2012 in [place], [place] showed NSR with episodes of sinus brady, rare PVCs, occasional PACs and brief episodes of atrial tachycardia. Per records he has been on Multaq in the past but did not like this medication so it was stopped. He had a stress myoview 09/14/05 that showed reversible ischemia in the inferior wall. This led to a cardiac cath on 10/01/05 which showed 25% mid LAD stenosis per report but no other evidence of CAD.  Last Myoview 9/13:  No ischemia.  Last seen by Dr. Verne Carrow 05/2012.  Patient seen in the emergency room 08/23/12 with complaints of edema. BNP was mildly elevated. There was some suggestion of pulmonary venous congestion and possible pleural effusions on chest x-ray. Patient's Lasix dose was doubled. Notes from the emergency room provider also indicate that the patient was given metolazone.  Patient tells me he received Lasix 40 mg IV x 2 in the ED.  His Lasix was held for 2 days and he took the metolazone those 2 days. He is off metolazone now and  back to taking Lasix 40 mg QD.  He thinks he is still taking HCTZ.  Patient notes chronic DOE.  He is probably Class IIb-III.  Had recent shoulder surgery.  Sleeping on 4 pillows without change.  No PND.  Edema is improved.  No chest pain or syncope.      Wt Readings from Last 3 Encounters:  09/09/12 277 lb 1.9 oz (125.701 kg)  05/29/12 275 lb (124.739 kg)  05/20/12 273 lb (123.832 kg)     Labs (4/13):   K 3.9, creatinine 1.3, TSH 2.16 Labs (12/13): K 3.6, creatinine 1.05, magnesium 1.5, proBNP 362.4, Hgb 14.7, dig 0.6  Chest x-ray 08/23/12: IMPRESSION:   1. Enlarged cardiac silhouette and prominence of the central pulmonary vasculature, nonspecific but may be seen in the setting of pulmonary arterial hypertension. Further evaluation with cardiac echo may be performed as clinically indicated. 2. Pulmonary venous congestion without frank evidence of edema though trace bilateral effusions are suspected. Further evaluation with a PA and lateral chest radiograph may be obtained as clinically indicated.   Past Medical History  Diagnosis Date  . Hypertension   . Acid reflux   . Hyperlipidemia   . Asthma   . Diabetes mellitus   . Atrial fibrillation     On coumadin  . Diastolic CHF     Normal LVEF by echo 02/2010  . Hypertensive heart disease  Mild LVH by echo 02/2010  . CAD (coronary artery disease)     25% LAD stenosis by cath 2007    Current Outpatient Prescriptions  Medication Sig Dispense Refill  . albuterol (PROVENTIL) (2.5 MG/3ML) 0.083% nebulizer solution Take 2.5 mg by nebulization every 6 (six) hours as needed. For breathing      . allopurinol (ZYLOPRIM) 100 MG tablet Take 100 mg by mouth daily.      [name] atorvastatin (LIPITOR) 40 MG tablet Take 40 mg by mouth daily.      . cloNIDine (CATAPRES) 0.2 MG tablet Take 0.2 mg by mouth 2 (two) times daily.      . colchicine 0.6 MG tablet Take 0.6 mg by mouth daily.      . diclofenac (VOLTAREN) 50 MG EC tablet Take 50 mg by mouth 2  (two) times daily.       . digoxin (LANOXIN) 0.25 MG tablet Take 250 mcg by mouth daily.      [name] esomeprazole (NEXIUM) 40 MG capsule Take 40 mg by mouth daily before breakfast.      . furosemide (LASIX) 40 MG tablet TAKE 1 TABLET BY MOUTH (40 MG TOTAL) BY MOUTH DAILY.  30 tablet  6  . hydrochlorothiazide (HYDRODIURIL) 25 MG tablet Take 25 mg by mouth daily.      [name] HYDROmorphone (DILAUDID) 2 MG tablet Take 2 mg by mouth every 4 (four) hours as needed. For pain      . metolazone (ZAROXOLYN) 2.5 MG tablet Take 1 tablet (2.5 mg total) by mouth daily.  2 tablet  0  . nitroGLYCERIN (NITROSTAT) 0.4 MG SL tablet Place 0.4 mg under the tongue every 5 (five) minutes as needed.      [name] oxyCODONE-acetaminophen (PERCOCET) 10-650 MG per tablet Take 1 tablet by mouth every 6 (six) hours as needed. For pain      . promethazine (PHENERGAN) 25 MG tablet Take 25 mg by mouth every 6 (six) hours as needed. For nausea      . testosterone cypionate (DEPOTESTOTERONE CYPIONATE) 200 MG/ML injection Injection 400 ml  Once a month      . valsartan (DIOVAN) 160 MG tablet Take 160 mg by mouth daily.      [name] warfarin (COUMADIN) 5 MG tablet Take 2.5-5 mg by mouth daily. Take 1/2 tablet on Monday, Wednesday and Friday then take 1 tablet the other other days        Allergies: Allergies  Allergen Reactions  . Ramipril   . Testosterone    Social History:  The patient  reports that he has never smoked. He has never used smokeless tobacco. He reports that he drinks about one ounce of alcohol per week. He reports that he does not use illicit drugs.   ROS:  Please see the history of present illness.    All other systems reviewed and negative.   PHYSICAL EXAM: VS:  BP 122/74  Pulse 74  Ht 5\' 11"  (1.803 m)  Wt 277 lb 1.9 oz (125.701 kg)  BMI 38.65 kg/m2 Well nourished, well developed, in no acute distress HEENT: normal Neck: no JVD Cardiac:  normal S1, S2; irreg irreg; no murmur Lungs:  clear to auscultation bilaterally, no  wheezing, rhonchi or rales Abd: soft, nontender, no hepatomegaly Ext: trace Left LE edema; no edema on the right Skin: warm and dry Neuro:  CNs 2-12 intact, no focal abnormalities noted  EKG:  Atrial fibrillation, HR 80  1. Chronic Diastolic CHF:   He presented to  the emergency room with increased edema. He also notes that he had increased shortness of breath. He tells me that his edema and breathing are better. His weights are fairly close to baseline. He does have chronic shortness of breath with exertion. I will check a basic metabolic panel and BNP today. If his BNP remained elevated, I will adjust his Lasix further. We discussed low-sodium diet as well as weighing himself daily.  2. Atrial Fibrillation:   Rate controlled. He remains on Coumadin.  3. Arthritis:   He is out of Cytotec. He is requesting a refill. I will refill this one time for him. He will need further refills from his primary care physician.  4. Hypertension:   Controlled.  5. Dyspnea:   Likely multifactorial. Check a BNP as noted. Adjust Lasix if necessary.  6. Disposition:   Followup with Dr. Verne Carrow in 3 mos.  Signed, Tereso Newcomer, PA-C  2:38 PM 09/09/2012

## 2012-09-10 ENCOUNTER — Telehealth: Payer: Self-pay | Admitting: *Deleted

## 2012-09-10 NOTE — Telephone Encounter (Signed)
Message copied by Tarri Fuller on Wed Sep 10, 2012 10:07 AM ------      Message from: [place], [place] T      Created: Wed Sep 10, 2012 10:01 AM       BNP normal.  Creatinine and K+ ok.      Continue current dose of diuretics.      Tereso Newcomer, PA-C  10:01 AM 09/10/2012

## 2012-09-10 NOTE — Telephone Encounter (Signed)
pt notified about lab results w/verbal understanding today 

## 2012-09-15 ENCOUNTER — Ambulatory Visit (INDEPENDENT_AMBULATORY_CARE_PROVIDER_SITE_OTHER): Payer: Medicare Other | Admitting: Pharmacist

## 2012-09-15 DIAGNOSIS — I4891 Unspecified atrial fibrillation: Secondary | ICD-10-CM

## 2012-09-15 DIAGNOSIS — Z7901 Long term (current) use of anticoagulants: Secondary | ICD-10-CM

## 2012-09-29 ENCOUNTER — Ambulatory Visit (INDEPENDENT_AMBULATORY_CARE_PROVIDER_SITE_OTHER): Payer: Medicare Other | Admitting: Pharmacist

## 2012-09-29 DIAGNOSIS — I4891 Unspecified atrial fibrillation: Secondary | ICD-10-CM

## 2012-09-29 DIAGNOSIS — Z7901 Long term (current) use of anticoagulants: Secondary | ICD-10-CM | POA: Diagnosis not present

## 2012-10-04 DIAGNOSIS — Z87898 Personal history of other specified conditions: Secondary | ICD-10-CM | POA: Diagnosis not present

## 2012-10-09 DIAGNOSIS — M719 Bursopathy, unspecified: Secondary | ICD-10-CM | POA: Diagnosis not present

## 2012-10-09 DIAGNOSIS — M67919 Unspecified disorder of synovium and tendon, unspecified shoulder: Secondary | ICD-10-CM | POA: Diagnosis not present

## 2012-10-09 DIAGNOSIS — M75120 Complete rotator cuff tear or rupture of unspecified shoulder, not specified as traumatic: Secondary | ICD-10-CM | POA: Diagnosis not present

## 2012-10-09 DIAGNOSIS — M24119 Other articular cartilage disorders, unspecified shoulder: Secondary | ICD-10-CM | POA: Diagnosis not present

## 2012-10-13 ENCOUNTER — Ambulatory Visit (INDEPENDENT_AMBULATORY_CARE_PROVIDER_SITE_OTHER): Payer: Medicare Other | Admitting: Pharmacist

## 2012-10-13 DIAGNOSIS — Z7901 Long term (current) use of anticoagulants: Secondary | ICD-10-CM

## 2012-10-13 DIAGNOSIS — M75120 Complete rotator cuff tear or rupture of unspecified shoulder, not specified as traumatic: Secondary | ICD-10-CM | POA: Diagnosis not present

## 2012-10-13 DIAGNOSIS — M67919 Unspecified disorder of synovium and tendon, unspecified shoulder: Secondary | ICD-10-CM | POA: Diagnosis not present

## 2012-10-13 DIAGNOSIS — M719 Bursopathy, unspecified: Secondary | ICD-10-CM | POA: Diagnosis not present

## 2012-10-13 DIAGNOSIS — M24119 Other articular cartilage disorders, unspecified shoulder: Secondary | ICD-10-CM | POA: Diagnosis not present

## 2012-10-13 DIAGNOSIS — I4891 Unspecified atrial fibrillation: Secondary | ICD-10-CM | POA: Diagnosis not present

## 2012-10-13 LAB — POCT INR: INR: 1.8

## 2012-11-01 DIAGNOSIS — Z87898 Personal history of other specified conditions: Secondary | ICD-10-CM | POA: Diagnosis not present

## 2012-11-04 ENCOUNTER — Ambulatory Visit (INDEPENDENT_AMBULATORY_CARE_PROVIDER_SITE_OTHER): Payer: Medicare Other | Admitting: *Deleted

## 2012-11-04 DIAGNOSIS — I4891 Unspecified atrial fibrillation: Secondary | ICD-10-CM | POA: Diagnosis not present

## 2012-11-04 DIAGNOSIS — Z7901 Long term (current) use of anticoagulants: Secondary | ICD-10-CM

## 2012-11-04 LAB — POCT INR: INR: 2.2

## 2012-11-10 ENCOUNTER — Encounter: Payer: Self-pay | Admitting: Cardiovascular Disease

## 2012-11-10 ENCOUNTER — Ambulatory Visit (INDEPENDENT_AMBULATORY_CARE_PROVIDER_SITE_OTHER): Payer: Medicare Other | Admitting: Cardiovascular Disease

## 2012-11-10 VITALS — BP 139/74 | HR 77 | Ht 68.0 in | Wt 284.0 lb

## 2012-11-10 DIAGNOSIS — I5032 Chronic diastolic (congestive) heart failure: Secondary | ICD-10-CM | POA: Diagnosis not present

## 2012-11-10 DIAGNOSIS — I10 Essential (primary) hypertension: Secondary | ICD-10-CM | POA: Diagnosis not present

## 2012-11-10 MED ORDER — METOPROLOL TARTRATE 50 MG PO TABS: 50.0000 mg | ORAL_TABLET | Freq: Two times a day (BID) | ORAL | Status: DC

## 2012-11-10 MED ORDER — OXYCODONE-ACETAMINOPHEN 10-650 MG PO TABS: 1.0000 | ORAL_TABLET | Freq: Four times a day (QID) | ORAL | Status: DC | PRN

## 2012-11-10 MED ORDER — FUROSEMIDE 40 MG PO TABS: 40.0000 mg | ORAL_TABLET | Freq: Two times a day (BID) | ORAL | Status: DC

## 2012-11-10 NOTE — Patient Instructions (Addendum)
Your physician recommends that you schedule a follow-up appointment in: 10-14 days with Tereso Newcomer, PA.  Your physician recommends that you return for lab work on day of appt with Tereso Newcomer, PA--BMP  Your physician has recommended you make the following change in your medication:  Increase furosemide to 40 mg by mouth twice daily. Start Lopressor 50 mg by mouth twice daily

## 2012-11-10 NOTE — Progress Notes (Signed)
History of Present Illness: 57 yo male with history of diastolic CHF, paroxysmal AFib, tobacco abuse, HTN, HL, gout, GERD who is here today for cardiac follow up. I saw him as a new patient in January 2013. He was seen in the [facility] on 09/18/11 for HTN. Diovan was increased to 160 mg po Qdaily and he was referred to our office for f/u. He lives here and spends time with family in [place], [place] ([place]). He had been followed by Dr. Charm Rings At [facility] in [place], [place]. Echo 6/11: mild LVH, EF of 55%. miild LAE. Monitor April 2012 in [place], [place] showed NSR with episodes of sinus brady, rare PVCs, occasional PACs and brief episodes of atrial tachycardia. Per records he has been on Multaq in the past but did not like this medication so it was stopped. He had a stress myoview 09/14/05 that showed reversible ischemia in the inferior wall. This led to a cardiac cath on 10/01/05 which showed 25% mid LAD stenosis per report but no other evidence of CAD. Last Myoview 9/13: No ischemia. He was seen in the emergency room 08/23/12 with complaints of edema. BNP was mildly elevated. There was some suggestion of pulmonary venous congestion and possible pleural effusions on chest x-ray. Patient's Lasix dose was doubled. He was seen by Tereso Newcomer, PA-C in our office 09/09/12 for ED f/u. His Lasix was continued.   He is here today for follow up.  He is up 7 pounds since last visit here two months ago. No chest pain. He does endorse more dyspnea over last month. He is in severe pain from legs, arms, shoulders, back. This is chronic. No energy. Worsened lower ext edema.   Primary Care Physician:  Alicia Amel PA-C [facility] [place] , [facility]  [phone]   Past Medical History  Diagnosis Date  . Hypertension   . Acid reflux   . Hyperlipidemia   . Asthma   . Diabetes mellitus   . Atrial fibrillation     On coumadin  . Diastolic CHF     Normal LVEF by echo  02/2010  . Hypertensive heart disease     Mild LVH by echo 02/2010  . CAD (coronary artery disease)     25% LAD stenosis by cath 2007    Past Surgical History  Procedure Laterality Date  . Left leg surgery    . Left arm surgery    . Shoulder surgery      Right    Current Outpatient Prescriptions  Medication Sig Dispense Refill  . albuterol (PROVENTIL) (2.5 MG/3ML) 0.083% nebulizer solution Take 2.5 mg by nebulization every 6 (six) hours as needed. For breathing      . allopurinol (ZYLOPRIM) 100 MG tablet Take 100 mg by mouth daily.      [name] atorvastatin (LIPITOR) 40 MG tablet Take 40 mg by mouth daily.      . cloNIDine (CATAPRES) 0.2 MG tablet Take 0.2 mg by mouth 2 (two) times daily.      . colchicine 0.6 MG tablet Take 0.6 mg by mouth daily.      . diclofenac (VOLTAREN) 50 MG EC tablet Take 50 mg by mouth 2 (two) times daily.       . digoxin (LANOXIN) 0.25 MG tablet Take 250 mcg by mouth daily.      [name] esomeprazole (NEXIUM) 40 MG capsule Take 40 mg by mouth daily before breakfast.      . furosemide (  LASIX) 40 MG tablet TAKE 1 TABLET BY MOUTH (40 MG TOTAL) BY MOUTH DAILY.  30 tablet  6  . hydrochlorothiazide (HYDRODIURIL) 25 MG tablet Take 25 mg by mouth daily.      . misoprostol (CYTOTEC) 200 MCG tablet Take 1 tablet (200 mcg total) by mouth daily.  30 tablet  0  . nitroGLYCERIN (NITROSTAT) 0.4 MG SL tablet Place 0.4 mg under the tongue every 5 (five) minutes as needed.      [name] oxyCODONE-acetaminophen (PERCOCET) 10-650 MG per tablet Take 1 tablet by mouth every 6 (six) hours as needed. For pain      . testosterone cypionate (DEPOTESTOTERONE CYPIONATE) 200 MG/ML injection Injection 400 ml  Once a month      . valsartan (DIOVAN) 160 MG tablet Take 160 mg by mouth daily.      [name] warfarin (COUMADIN) 5 MG tablet Take 2.5-5 mg by mouth daily. Take 1/2 tablet on Monday, Wednesday and Friday then take 1 tablet the other other days       No current facility-administered medications for this  visit.    Allergies  Allergen Reactions  . Ramipril   . Testosterone     History   Social History  . Marital Status: Widowed    Spouse Name: N/A    Number of Children: N/A  . Years of Education: N/A   Occupational History  . Not on file.   Social History Main Topics  . Smoking status: Never Smoker   . Smokeless tobacco: Never Used  . Alcohol Use: 1.0 oz/week    2 drink(s) per week  . Drug Use: No  . Sexually Active: Not on file   Other Topics Concern  . Not on file   Social History Narrative  . No narrative on file    Family History  Problem Relation Age of Onset  . Heart failure Mother   . Dementia Father     Review of Systems:  As stated in the HPI and otherwise negative.   BP 139/74  Pulse 77  Ht 5\' 8"  (1.727 m)  Wt 284 lb (128.822 kg)  BMI 43.19 kg/m2  Physical Examination: General: Well developed, well nourished, NAD HEENT: OP clear, mucus membranes moist SKIN: warm, dry. No rashes. Neuro: No focal deficits Musculoskeletal: Muscle strength 5/5 all ext Psychiatric: Mood and affect normal Neck: No JVD, no carotid bruits, no thyromegaly, no lymphadenopathy. Lungs:Clear bilaterally, no wheezes, rhonci, crackles Cardiovascular: Irreg irregRegular rate and rhythm. No murmurs, gallops or rubs. Abdomen:Soft. Bowel sounds present. Non-tender.  Extremities: No lower extremity edema. Pulses are 2 + in the bilateral DP/PT.  Assessment and Plan:   1. Chronic Diastolic CHF: His volume status is up today. Will increase Lasix to 40 mg po BID and check BMET in 10 days. Will have him follow daily weights. F/U with Tereso Newcomer, PA-C in 10 days.   2. Atrial Fibrillation: Rate controlled on Digoxin. He had been on Lopressor but we stopped this last year due to fatigue. No difference in fatigue off of Lopressor. Will restart Lopressor 50 mg po BID. He remains on Coumadin. He is followed in our coumadin clinic.   3. Hypertension: Controlled.

## 2012-11-11 ENCOUNTER — Other Ambulatory Visit: Payer: Self-pay | Admitting: *Deleted

## 2012-11-11 ENCOUNTER — Telehealth: Payer: Self-pay | Admitting: Cardiovascular Disease

## 2012-11-11 MED ORDER — OXYCODONE-ACETAMINOPHEN 10-325 MG PO TABS: 1.0000 | ORAL_TABLET | Freq: Four times a day (QID) | ORAL | Status: DC | PRN

## 2012-11-11 NOTE — Telephone Encounter (Signed)
New Rx printed and will be signed by Dr. Clifton James tomorrow.  Pt aware and will come pick up new RX and return previous one tomorrow morning.

## 2012-11-11 NOTE — Telephone Encounter (Signed)
Pt calling re percocet rx can't be filled at the mg he gave, they can go 10/325, will need a new written rx, call when ready, @ [phone]

## 2012-11-14 DIAGNOSIS — E781 Pure hyperglyceridemia: Secondary | ICD-10-CM | POA: Diagnosis not present

## 2012-11-14 DIAGNOSIS — G894 Chronic pain syndrome: Secondary | ICD-10-CM | POA: Diagnosis not present

## 2012-11-14 DIAGNOSIS — E291 Testicular hypofunction: Secondary | ICD-10-CM | POA: Diagnosis not present

## 2012-11-14 DIAGNOSIS — I10 Essential (primary) hypertension: Secondary | ICD-10-CM | POA: Diagnosis not present

## 2012-11-20 ENCOUNTER — Other Ambulatory Visit (INDEPENDENT_AMBULATORY_CARE_PROVIDER_SITE_OTHER): Payer: Medicare Other

## 2012-11-20 ENCOUNTER — Encounter: Payer: Self-pay | Admitting: Physician Assistant

## 2012-11-20 ENCOUNTER — Ambulatory Visit (INDEPENDENT_AMBULATORY_CARE_PROVIDER_SITE_OTHER): Payer: Medicare Other | Admitting: Physician Assistant

## 2012-11-20 VITALS — BP 122/78 | HR 62 | Ht 71.0 in | Wt 287.0 lb

## 2012-11-20 DIAGNOSIS — I5032 Chronic diastolic (congestive) heart failure: Secondary | ICD-10-CM

## 2012-11-20 DIAGNOSIS — R0602 Shortness of breath: Secondary | ICD-10-CM

## 2012-11-20 DIAGNOSIS — I4891 Unspecified atrial fibrillation: Secondary | ICD-10-CM

## 2012-11-20 DIAGNOSIS — R0989 Other specified symptoms and signs involving the circulatory and respiratory systems: Secondary | ICD-10-CM

## 2012-11-20 LAB — CBC WITH DIFFERENTIAL/PLATELET
Basophils Relative: 0.3 % (ref 0.0–3.0)
HCT: 43.1 % (ref 39.0–52.0)
Hemoglobin: 14.3 g/dL (ref 13.0–17.0)
Lymphocytes Relative: 21.6 % (ref 12.0–46.0)
Lymphs Abs: 1.5 10*3/uL (ref 0.7–4.0)
Monocytes Relative: 9.1 % (ref 3.0–12.0)
Neutro Abs: 4.8 10*3/uL (ref 1.4–7.7)
RBC: 4.61 Mil/uL (ref 4.22–5.81)

## 2012-11-20 LAB — BASIC METABOLIC PANEL
BUN: 13 mg/dL (ref 6–23)
Chloride: 99 mEq/L (ref 96–112)
GFR: 79.88 mL/min (ref >60.00)
Glucose, Bld: 112 mg/dL — ABNORMAL HIGH (ref 70–99)
Potassium: 4 mEq/L (ref 3.5–5.1)
Sodium: 140 mEq/L (ref 135–145)

## 2012-11-20 LAB — BRAIN NATRIURETIC PEPTIDE: Pro B Natriuretic peptide (BNP): 123 pg/mL — ABNORMAL HIGH (ref 0.0–100.0)

## 2012-11-20 NOTE — Patient Instructions (Addendum)
Your physician recommends that you return for lab work in: today  Your provide recommends that you start a 2 gram sodium diet, please see handout given to you at today's office visit  Your physician recommends that you schedule a follow-up appointment in: 7 to 10 days

## 2012-11-20 NOTE — Progress Notes (Signed)
[address] [place], [place]  [address] Phone: [phone] Fax:  [phone]  Date:  11/20/2012   ID:  Juan Calderon, Juan Calderon Jun 18, 1956, MRN [id number]  PCP:  Default, Provider, MD  Primary Cardiologist:  Dr. Verne Carrow     History of Present Illness: Juan Calderon is a 57 y.o. male who returns for follow up on CHF.  He has a hx of diastolic CHF, paroxysmal AFib, tobacco abuse, HTN, HL, gout, GERD. Seen by Dr. Verne Carrow as new patient in 09/2011. He was seen in the [facility] on 09/18/11 for HTN. Diovan was increased to 160 mg po QD and he was referred to our office for f/u. He lives here and spends time with family in [place], [place] ([place]). He had been followed by Dr. Charm Rings At [facility] in [place], [place]. Echo 6/11: mild LVH, EF of 55%. miild LAE. Event Monitor April 2012 in [place], [place] showed NSR with episodes of sinus brady, rare PVCs, occasional PACs and brief episodes of atrial tachycardia. Per records he has been on Multaq in the past but did not like this medication so it was stopped. He had a stress myoview 09/14/05 that showed reversible ischemia in the inferior wall. This led to a cardiac cath on 10/01/05 which showed 25% mid LAD stenosis per report but no other evidence of CAD. Last Myoview 9/13: No ischemia.   He was seen by Dr. Clifton James 11/10/12. His weight was up 7 pounds and he was more short of breath. His Lasix was increased and he was asked to follow up today.  Since being seen, his weight is up 3 more lbs.  He notes slight improvement in LE edema.  Still thinks his abdominal girth is increased.  Sleeps on 4 pillows chronically.  No PND.  Still feels SOB.  Notes chronic DOE.  But worse recently.  Reports NYHA Class III symptoms.  No syncope or CP.  Labs (1/14):  K 4.1, creatinine 1.3, BNP 362 => 56  Wt Readings from Last 3 Encounters:  11/20/12 287 lb (130.182 kg)  11/10/12 284 lb (128.822 kg)    09/09/12 277 lb 1.9 oz (125.701 kg)     Past Medical History  Diagnosis Date  . Hypertension   . Acid reflux   . Hyperlipidemia   . Asthma   . Diabetes mellitus   . Atrial fibrillation     On coumadin  . Diastolic CHF     Normal LVEF by echo 02/2010  . Hypertensive heart disease     Mild LVH by echo 02/2010  . CAD (coronary artery disease)     25% LAD stenosis by cath 2007    Current Outpatient Prescriptions  Medication Sig Dispense Refill  . albuterol (PROVENTIL) (2.5 MG/3ML) 0.083% nebulizer solution Take 2.5 mg by nebulization every 6 (six) hours as needed. For breathing      . allopurinol (ZYLOPRIM) 100 MG tablet Take 100 mg by mouth daily.      [name] atorvastatin (LIPITOR) 40 MG tablet Take 40 mg by mouth daily.      . cloNIDine (CATAPRES) 0.2 MG tablet Take 0.2 mg by mouth 2 (two) times daily.      . colchicine 0.6 MG tablet Take 0.6 mg by mouth daily.      . diclofenac (VOLTAREN) 50 MG EC tablet Take 50 mg by mouth 2 (two) times daily.       . digoxin (LANOXIN) 0.25 MG  tablet Take 250 mcg by mouth daily.      [name] esomeprazole (NEXIUM) 40 MG capsule Take 40 mg by mouth daily before breakfast.      . furosemide (LASIX) 40 MG tablet Take 1 tablet (40 mg total) by mouth 2 (two) times daily.  180 tablet  3  . hydrochlorothiazide (HYDRODIURIL) 25 MG tablet Take 25 mg by mouth daily.      . metoprolol (LOPRESSOR) 50 MG tablet Take 1 tablet (50 mg total) by mouth 2 (two) times daily.  180 tablet  3  . misoprostol (CYTOTEC) 200 MCG tablet Take 1 tablet (200 mcg total) by mouth daily.  30 tablet  0  . nitroGLYCERIN (NITROSTAT) 0.4 MG SL tablet Place 0.4 mg under the tongue every 5 (five) minutes as needed.      [name] oxyCODONE-acetaminophen (PERCOCET) 10-325 MG per tablet Take 1 tablet by mouth every 6 (six) hours as needed for pain.  30 tablet  0  . testosterone cypionate (DEPOTESTOTERONE CYPIONATE) 200 MG/ML injection Injection 400 ml  Once a month      . valsartan (DIOVAN) 160 MG tablet  Take 160 mg by mouth daily.      [name] warfarin (COUMADIN) 5 MG tablet Take 2.5-5 mg by mouth daily. Take 1/2 tablet on Monday, Wednesday and Friday then take 1 tablet the other other days       No current facility-administered medications for this visit.    Allergies:    Allergies  Allergen Reactions  . Ramipril   . Testosterone     Social History:  The patient  reports that he has never smoked. He has never used smokeless tobacco. He reports that he drinks about 1.0 ounces of alcohol per week. He reports that he does not use illicit drugs.   ROS:  Please see the history of present illness.    All other systems reviewed and negative.   PHYSICAL EXAM: VS:  BP 122/78  Pulse 62  Ht 5\' 11"  (1.803 m)  Wt 287 lb (130.182 kg)  BMI 40.05 kg/m2 Well nourished, well developed, in no acute distress HEENT: normal Neck: no JVD Cardiac:  normal S1, S2; irregularly irregular rhythm, no S3 Lungs:  clear to auscultation bilaterally, no wheezing, rhonchi or rales Abd: soft, nontender, no hepatomegaly Ext: trace right LE edema Skin: warm and dry Neuro:  CNs 2-12 intact, no focal abnormalities noted  EKG:  Atrial fibrillation, HR 53 inferolateral T wave inversions, no change prior tracing     ASSESSMENT AND PLAN:  1. Chronic Diastolic CHF:  Weight continues to go up and he has class III dyspnea.  However, his exam is not that impressive for volume overload.  He feels slightly better than when last seen.  Continue current dose of Lasix.  Check BMET, CBC, BNP.  If BNP is high, will adjust lasix further or add metolazone x 1-2 doses (has worked in the past for him).  Will bring back again in close follow up. 2. Atrial Fibrillation:  Rate controlled.  He is followed in the coumadin clinic. 3. Hypertension:  Controlled.  Continue current therapy.  4. Coronary Artery Disease:  Recent myoview low risk.  Continue statin.  No ASA as he is on coumadin. 5. Disposition:  Follow up with me in 7-10  days.  Signed, Tereso Newcomer, PA-C  2:38 PM 11/20/2012

## 2012-11-21 ENCOUNTER — Other Ambulatory Visit: Payer: Self-pay | Admitting: *Deleted

## 2012-11-21 DIAGNOSIS — R0602 Shortness of breath: Secondary | ICD-10-CM

## 2012-11-21 MED ORDER — FUROSEMIDE 20 MG PO TABS: 20.0000 mg | ORAL_TABLET | Freq: Two times a day (BID) | ORAL | Status: DC

## 2012-11-27 ENCOUNTER — Encounter: Payer: Self-pay | Admitting: Physician Assistant

## 2012-11-27 ENCOUNTER — Ambulatory Visit: Payer: Medicare Other

## 2012-11-27 ENCOUNTER — Ambulatory Visit (INDEPENDENT_AMBULATORY_CARE_PROVIDER_SITE_OTHER): Payer: Medicare Other | Admitting: Physician Assistant

## 2012-11-27 VITALS — BP 122/80 | HR 50 | Ht 71.0 in | Wt 292.0 lb

## 2012-11-27 DIAGNOSIS — I10 Essential (primary) hypertension: Secondary | ICD-10-CM

## 2012-11-27 DIAGNOSIS — M75120 Complete rotator cuff tear or rupture of unspecified shoulder, not specified as traumatic: Secondary | ICD-10-CM | POA: Diagnosis not present

## 2012-11-27 DIAGNOSIS — R0602 Shortness of breath: Secondary | ICD-10-CM | POA: Diagnosis not present

## 2012-11-27 DIAGNOSIS — I4891 Unspecified atrial fibrillation: Secondary | ICD-10-CM | POA: Diagnosis not present

## 2012-11-27 DIAGNOSIS — R635 Abnormal weight gain: Secondary | ICD-10-CM | POA: Diagnosis not present

## 2012-11-27 DIAGNOSIS — I5032 Chronic diastolic (congestive) heart failure: Secondary | ICD-10-CM | POA: Diagnosis not present

## 2012-11-27 LAB — BASIC METABOLIC PANEL
BUN: 17 mg/dL (ref 6–23)
CO2: 34 mEq/L — ABNORMAL HIGH (ref 19–32)
Calcium: 9 mg/dL (ref 8.4–10.5)
Chloride: 94 mEq/L — ABNORMAL LOW (ref 96–112)
Creatinine, Ser: 1.5 mg/dL (ref 0.4–1.5)
Glucose, Bld: 115 mg/dL — ABNORMAL HIGH (ref 70–99)

## 2012-11-27 MED ORDER — DIGOXIN 125 MCG PO TABS: 0.1250 mg | ORAL_TABLET | Freq: Every day | ORAL | Status: DC

## 2012-11-27 NOTE — Patient Instructions (Addendum)
Your physician recommends that you return for lab work in: today (bmet, tsh, bnp)  Your physician has recommended you make the following change in your medication: DECREASE your Digoxin to 0.125mg  daily  Your physician wants you to follow-up in: 6 months with Dr Clifton James.   You will receive a reminder letter in the mail two months in advance. If you don't receive a letter, please call our office to schedule the follow-up appointment.

## 2012-11-27 NOTE — Progress Notes (Signed)
[address] [place], [place]  [address] Phone: [phone] Fax:  [phone]  Date:  11/27/2012   ID:  Juan, Calderon 08-Feb-1956, MRN [id number]  PCP:  Dr. Elam Dutch in [place], [place]  Primary Cardiologist:  Dr. Verne Carrow     History of Present Illness: Juan Calderon is a 57 y.o. male who returns for follow up on CHF.  He has a hx of diastolic CHF, paroxysmal AFib, tobacco abuse, HTN, HL, gout, GERD. Seen by Dr. Verne Carrow as new patient in 09/2011.  He lives here and spends time with family in [place], [place] ([place]). He had been followed by Dr. Charm Rings At [facility] in [place], [place]. Echo 6/11: mild LVH, EF of 55%. miild LAE. Event Monitor April 2012 in [place], [place] showed NSR with episodes of sinus brady, rare PVCs, occasional PACs and brief episodes of atrial tachycardia. Per records he has been on Multaq in the past but did not like this medication so it was stopped. He had a stress myoview 09/14/05 that showed reversible ischemia in the inferior wall. This led to a cardiac cath on 10/01/05 which showed 25% mid LAD stenosis per report but no other evidence of CAD. Last Myoview 9/13: No ischemia.   He was seen by Dr. Clifton James 11/10/12. His weight was up 7 pounds and he was more short of breath. His Lasix was increased.  I saw him last week and his weight was higher.  BNP was marginally elevated.  I increased his Lasix some more.  He is s/w of a difficult historian.  He tells me his DOE is chronic and stable over the last few years.  He sleeps on 4 pillows.  This is chronic.  No PND.  LE edema on the left is chronic from an old injury.  No CP.  No syncope.    Labs (1/14):  K 4.1, creatinine 1.3, BNP 362 => 56 Labs (3/14):  K 4, Cr 1.2, Hgb 14.3, BNP 123  Wt Readings from Last 3 Encounters:  11/27/12 292 lb (132.45 kg)  11/20/12 287 lb (130.182 kg)  11/10/12 284 lb (128.822 kg)     Past Medical History   Diagnosis Date  . Hypertension   . Acid reflux   . Hyperlipidemia   . Asthma   . Diabetes mellitus   . Atrial fibrillation     On coumadin  . Diastolic CHF     Normal LVEF by echo 02/2010  . Hypertensive heart disease     Mild LVH by echo 02/2010  . CAD (coronary artery disease)     25% LAD stenosis by cath 2007    Current Outpatient Prescriptions  Medication Sig Dispense Refill  . albuterol (PROVENTIL) (2.5 MG/3ML) 0.083% nebulizer solution Take 2.5 mg by nebulization every 6 (six) hours as needed. For breathing      . allopurinol (ZYLOPRIM) 100 MG tablet Take 100 mg by mouth daily.      [name] atorvastatin (LIPITOR) 40 MG tablet Take 40 mg by mouth daily.      . cloNIDine (CATAPRES) 0.2 MG tablet Take 0.2 mg by mouth 2 (two) times daily.      . colchicine 0.6 MG tablet Take 0.6 mg by mouth daily.      . diclofenac (VOLTAREN) 50 MG EC tablet Take 50 mg by mouth 2 (two) times daily.       . digoxin (LANOXIN) 0.25 MG tablet Take 250 mcg by mouth  daily.      . esomeprazole (NEXIUM) 40 MG capsule Take 40 mg by mouth daily before breakfast.      . furosemide (LASIX) 20 MG tablet Take 1 tablet (20 mg total) by mouth 2 (two) times daily.  14 tablet  0  . furosemide (LASIX) 40 MG tablet Take 1 tablet (40 mg total) by mouth 2 (two) times daily.  180 tablet  3  . hydrochlorothiazide (HYDRODIURIL) 25 MG tablet Take 25 mg by mouth daily.      . metoprolol (LOPRESSOR) 50 MG tablet Take 1 tablet (50 mg total) by mouth 2 (two) times daily.  180 tablet  3  . misoprostol (CYTOTEC) 200 MCG tablet Take 1 tablet (200 mcg total) by mouth daily.  30 tablet  0  . nitroGLYCERIN (NITROSTAT) 0.4 MG SL tablet Place 0.4 mg under the tongue every 5 (five) minutes as needed.      [name] oxyCODONE-acetaminophen (PERCOCET) 10-325 MG per tablet Take 1 tablet by mouth every 6 (six) hours as needed for pain.  30 tablet  0  . testosterone cypionate (DEPOTESTOTERONE CYPIONATE) 200 MG/ML injection Injection 400 ml  Once a month       . valsartan (DIOVAN) 160 MG tablet Take 160 mg by mouth daily.      [name] warfarin (COUMADIN) 5 MG tablet Take 2.5-5 mg by mouth daily. Take 1/2 tablet on Monday, Wednesday and Friday then take 1 tablet the other other days       No current facility-administered medications for this visit.    Allergies:    Allergies  Allergen Reactions  . Ramipril   . Testosterone     Social History:  The patient  reports that he has never smoked. He has never used smokeless tobacco. He reports that he drinks about 1.0 ounces of alcohol per week. He reports that he does not use illicit drugs.   ROS:  Please see the history of present illness. He has been constipated.  All other systems reviewed and negative.   PHYSICAL EXAM: VS:  BP 122/80  Pulse 50  Ht 5\' 11"  (1.803 m)  Wt 292 lb (132.45 kg)  BMI 40.74 kg/m2 Well nourished, well developed, in no acute distress HEENT: normal Neck: no JVD Cardiac:  normal S1, S2; irregularly irregular rhythm, no S3 Lungs:  clear to auscultation bilaterally, no wheezing, rhonchi or rales Abd: soft, nontender, no hepatomegaly Ext: trace left LE edema, no RLE edema Skin: warm and dry Neuro:  CNs 2-12 intact, no focal abnormalities noted  EKG:  Atrial fibrillation, HR 50, inferolateral T wave inversions, no change from prior tracing     ASSESSMENT AND PLAN:  1. Chronic Diastolic CHF:  Weight is even higher today.  But he does not look volume overloaded on exam.  Check BMET, BNP today.  If creatinine and K+ stable, he can continue his current dose of Lasix.  Dyspnea is likely multifactorial.  This has been chronic without change.  No further cardiac workup.   2. Atrial Fibrillation:  Rate slow.  Will decrease Digoxin to 0.125 mg QD.  He is followed in the coumadin clinic. 3. Hypertension:  Controlled.  Continue current therapy.  4. Coronary Artery Disease:  Recent myoview low risk.  Continue statin.  No ASA as he is on coumadin. 5. Weight Gain:  As noted, I do not  think this is from a/c CHF.  Will check a TSH. 6. Disposition:  Follow up with Dr. Verne Carrow in 6 mos.  Luna Glasgow, PA-C  12:38 PM 11/27/2012

## 2012-11-28 ENCOUNTER — Other Ambulatory Visit: Payer: Medicare Other

## 2012-11-28 LAB — BRAIN NATRIURETIC PEPTIDE: Pro B Natriuretic peptide (BNP): 121 pg/mL — ABNORMAL HIGH (ref 0.0–100.0)

## 2012-12-02 ENCOUNTER — Other Ambulatory Visit: Payer: Self-pay

## 2012-12-02 DIAGNOSIS — Z87898 Personal history of other specified conditions: Secondary | ICD-10-CM | POA: Diagnosis not present

## 2012-12-02 DIAGNOSIS — I10 Essential (primary) hypertension: Secondary | ICD-10-CM

## 2012-12-03 ENCOUNTER — Ambulatory Visit: Payer: Medicare Other | Attending: Orthopedic Surgery | Admitting: Physical Therapy

## 2012-12-03 ENCOUNTER — Ambulatory Visit (INDEPENDENT_AMBULATORY_CARE_PROVIDER_SITE_OTHER): Payer: Medicare Other | Admitting: *Deleted

## 2012-12-03 DIAGNOSIS — I4891 Unspecified atrial fibrillation: Secondary | ICD-10-CM | POA: Diagnosis not present

## 2012-12-03 DIAGNOSIS — M25519 Pain in unspecified shoulder: Secondary | ICD-10-CM | POA: Diagnosis not present

## 2012-12-03 DIAGNOSIS — Z7901 Long term (current) use of anticoagulants: Secondary | ICD-10-CM

## 2012-12-03 DIAGNOSIS — IMO0001 Reserved for inherently not codable concepts without codable children: Secondary | ICD-10-CM | POA: Diagnosis not present

## 2012-12-03 DIAGNOSIS — M25619 Stiffness of unspecified shoulder, not elsewhere classified: Secondary | ICD-10-CM | POA: Insufficient documentation

## 2012-12-03 LAB — POCT INR: INR: 4.2

## 2012-12-05 ENCOUNTER — Other Ambulatory Visit (INDEPENDENT_AMBULATORY_CARE_PROVIDER_SITE_OTHER): Payer: Medicare Other

## 2012-12-05 DIAGNOSIS — I10 Essential (primary) hypertension: Secondary | ICD-10-CM | POA: Diagnosis not present

## 2012-12-05 LAB — BASIC METABOLIC PANEL
Calcium: 9.2 mg/dL (ref 8.4–10.5)
GFR: 64.2 mL/min (ref >60.00)
Potassium: 4.2 mEq/L (ref 3.5–5.1)
Sodium: 138 mEq/L (ref 135–145)

## 2012-12-08 ENCOUNTER — Ambulatory Visit: Payer: Medicare Other | Admitting: Physical Therapy

## 2012-12-09 ENCOUNTER — Other Ambulatory Visit (INDEPENDENT_AMBULATORY_CARE_PROVIDER_SITE_OTHER): Payer: Medicare Other

## 2012-12-09 ENCOUNTER — Other Ambulatory Visit: Payer: Medicare Other

## 2012-12-09 ENCOUNTER — Telehealth: Payer: Self-pay | Admitting: *Deleted

## 2012-12-09 DIAGNOSIS — R0602 Shortness of breath: Secondary | ICD-10-CM

## 2012-12-09 LAB — BRAIN NATRIURETIC PEPTIDE: Pro B Natriuretic peptide (BNP): 102 pg/mL — ABNORMAL HIGH (ref 0.0–100.0)

## 2012-12-09 NOTE — Telephone Encounter (Signed)
Message copied by Tarri Fuller on Tue Dec 09, 2012  1:50 PM ------      Message from: [place], [place] T      Created: Fri Dec 05, 2012  5:17 PM       Ok       Continue with current treatment plan.      Tereso Newcomer, PA-C  5:17 PM 12/05/2012 ------

## 2012-12-09 NOTE — Telephone Encounter (Signed)
lmom labs ok, no changes to be made 

## 2012-12-10 ENCOUNTER — Ambulatory Visit: Payer: Medicare Other | Admitting: Physical Therapy

## 2012-12-11 ENCOUNTER — Telehealth: Payer: Self-pay | Admitting: *Deleted

## 2012-12-11 NOTE — Telephone Encounter (Signed)
lmom labs ok, no changes to be made 

## 2012-12-11 NOTE — Telephone Encounter (Signed)
Message copied by Tarri Fuller on Thu Dec 11, 2012 12:33 PM ------      Message from: [place], [place] T      Created: Tue Dec 09, 2012  8:14 PM       Ok      Continue with current treatment plan.      Tereso Newcomer, PA-C  8:13 PM 12/09/2012 ------

## 2012-12-15 ENCOUNTER — Ambulatory Visit: Payer: Medicare Other | Admitting: Physical Therapy

## 2012-12-17 ENCOUNTER — Ambulatory Visit: Payer: Medicare Other | Admitting: Physical Therapy

## 2012-12-17 ENCOUNTER — Ambulatory Visit (INDEPENDENT_AMBULATORY_CARE_PROVIDER_SITE_OTHER): Payer: Medicare Other | Admitting: *Deleted

## 2012-12-17 DIAGNOSIS — Z7901 Long term (current) use of anticoagulants: Secondary | ICD-10-CM

## 2012-12-17 DIAGNOSIS — I4891 Unspecified atrial fibrillation: Secondary | ICD-10-CM | POA: Diagnosis not present

## 2012-12-18 DIAGNOSIS — E119 Type 2 diabetes mellitus without complications: Secondary | ICD-10-CM | POA: Diagnosis not present

## 2012-12-18 DIAGNOSIS — M109 Gout, unspecified: Secondary | ICD-10-CM | POA: Diagnosis not present

## 2012-12-18 DIAGNOSIS — G894 Chronic pain syndrome: Secondary | ICD-10-CM | POA: Diagnosis not present

## 2012-12-18 DIAGNOSIS — E781 Pure hyperglyceridemia: Secondary | ICD-10-CM | POA: Diagnosis not present

## 2012-12-18 DIAGNOSIS — H4011X0 Primary open-angle glaucoma, stage unspecified: Secondary | ICD-10-CM | POA: Diagnosis not present

## 2012-12-22 DIAGNOSIS — I10 Essential (primary) hypertension: Secondary | ICD-10-CM | POA: Diagnosis not present

## 2012-12-22 DIAGNOSIS — E119 Type 2 diabetes mellitus without complications: Secondary | ICD-10-CM | POA: Diagnosis not present

## 2012-12-22 DIAGNOSIS — R7989 Other specified abnormal findings of blood chemistry: Secondary | ICD-10-CM | POA: Diagnosis not present

## 2012-12-22 DIAGNOSIS — Z7901 Long term (current) use of anticoagulants: Secondary | ICD-10-CM | POA: Diagnosis not present

## 2012-12-24 ENCOUNTER — Ambulatory Visit: Payer: Medicare Other | Admitting: Physical Therapy

## 2012-12-24 ENCOUNTER — Ambulatory Visit (INDEPENDENT_AMBULATORY_CARE_PROVIDER_SITE_OTHER): Payer: Medicare Other | Admitting: Family Medicine

## 2012-12-24 ENCOUNTER — Encounter: Payer: Self-pay | Admitting: Family Medicine

## 2012-12-24 VITALS — BP 134/71 | HR 89 | Ht 71.0 in | Wt 292.0 lb

## 2012-12-24 DIAGNOSIS — I25810 Atherosclerosis of coronary artery bypass graft(s) without angina pectoris: Secondary | ICD-10-CM | POA: Diagnosis not present

## 2012-12-24 DIAGNOSIS — R079 Chest pain, unspecified: Secondary | ICD-10-CM | POA: Diagnosis not present

## 2012-12-24 DIAGNOSIS — I10 Essential (primary) hypertension: Secondary | ICD-10-CM | POA: Diagnosis not present

## 2012-12-24 DIAGNOSIS — B351 Tinea unguium: Secondary | ICD-10-CM | POA: Diagnosis not present

## 2012-12-24 DIAGNOSIS — R0602 Shortness of breath: Secondary | ICD-10-CM | POA: Diagnosis not present

## 2012-12-24 DIAGNOSIS — Z7901 Long term (current) use of anticoagulants: Secondary | ICD-10-CM | POA: Diagnosis not present

## 2012-12-24 DIAGNOSIS — I4891 Unspecified atrial fibrillation: Secondary | ICD-10-CM | POA: Diagnosis not present

## 2012-12-24 DIAGNOSIS — T148 Other injury of unspecified body region: Secondary | ICD-10-CM | POA: Diagnosis not present

## 2012-12-24 DIAGNOSIS — T148XXA Other injury of unspecified body region, initial encounter: Secondary | ICD-10-CM | POA: Diagnosis not present

## 2012-12-24 DIAGNOSIS — I5032 Chronic diastolic (congestive) heart failure: Secondary | ICD-10-CM | POA: Diagnosis not present

## 2012-12-24 DIAGNOSIS — R635 Abnormal weight gain: Secondary | ICD-10-CM | POA: Diagnosis not present

## 2012-12-24 NOTE — Patient Instructions (Addendum)
Thank you for coming in today. I am sorry that I cannot help you today.  I think your doctor or someone got mixed up with what kind of doctor I am.  You need to see a Podiatrist who are the special foot doctors.  I will send a letter to your doctor.   You have been scheduled for an appointment for Monday 12/29/12 at 3:15 pm at [facility] with Dr. Leeanne Deed Their office is located at [address] [place] [address] [phone]

## 2012-12-24 NOTE — Progress Notes (Signed)
Juan Calderon is a 57 y.o. male who presents to [facility] today for left great toenail hypertrophy.  Patient has developed a large left great toenail over the past several months. This is somewhat painful as it presses on the top part of his shoe.  He made himself an appointment here as we helped his friend's foot problem.  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 He feels well otherwise no other foot pain back pain or joint pain. No fevers or chills or swelling around the toenail.    PMH reviewed. Atrial fibrillation and hypertension  History  Substance Use Topics  . Smoking status: Never Smoker   . Smokeless tobacco: Never Used  . Alcohol Use: 1.0 oz/week    2 drink(s) per week   ROS as above otherwise neg   Exam:  BP 134/71  Pulse 89  Ht 5\' 11"  (1.803 m)  Wt 292 lb (132.45 kg)  BMI 40.74 kg/m2 Gen: Well NAD MSK: Left great toe. Hypertrophy toenail with onychomycosis. No surrounding erythema Sensation and capillary refill intact distally

## 2012-12-24 NOTE — Assessment & Plan Note (Signed)
Apologized to the patient and we are not a podiatrist office and we do not have the equipment to manage and trimmed down hypertrophy and onychomycosis toenails.  Recommend that he followup with a podiatrist.  [facility].

## 2012-12-25 ENCOUNTER — Ambulatory Visit: Payer: Medicare Other | Admitting: Physical Therapy

## 2012-12-25 DIAGNOSIS — M75120 Complete rotator cuff tear or rupture of unspecified shoulder, not specified as traumatic: Secondary | ICD-10-CM | POA: Diagnosis not present

## 2012-12-29 ENCOUNTER — Ambulatory Visit: Payer: Medicare Other | Admitting: Physical Therapy

## 2012-12-29 DIAGNOSIS — B351 Tinea unguium: Secondary | ICD-10-CM | POA: Diagnosis not present

## 2012-12-29 DIAGNOSIS — M79609 Pain in unspecified limb: Secondary | ICD-10-CM | POA: Diagnosis not present

## 2012-12-31 ENCOUNTER — Ambulatory Visit: Payer: Medicare Other | Admitting: Physical Therapy

## 2013-01-01 DIAGNOSIS — Z87898 Personal history of other specified conditions: Secondary | ICD-10-CM | POA: Diagnosis not present

## 2013-01-07 ENCOUNTER — Ambulatory Visit: Payer: Medicare Other | Attending: Orthopedic Surgery | Admitting: Physical Therapy

## 2013-01-07 DIAGNOSIS — M25619 Stiffness of unspecified shoulder, not elsewhere classified: Secondary | ICD-10-CM | POA: Diagnosis not present

## 2013-01-07 DIAGNOSIS — M25519 Pain in unspecified shoulder: Secondary | ICD-10-CM | POA: Insufficient documentation

## 2013-01-07 DIAGNOSIS — IMO0001 Reserved for inherently not codable concepts without codable children: Secondary | ICD-10-CM | POA: Insufficient documentation

## 2013-01-13 ENCOUNTER — Ambulatory Visit (INDEPENDENT_AMBULATORY_CARE_PROVIDER_SITE_OTHER): Payer: Medicare Other | Admitting: Radiation Oncology

## 2013-01-13 DIAGNOSIS — T148XXA Other injury of unspecified body region, initial encounter: Secondary | ICD-10-CM | POA: Diagnosis not present

## 2013-01-13 DIAGNOSIS — I4891 Unspecified atrial fibrillation: Secondary | ICD-10-CM

## 2013-01-13 DIAGNOSIS — I10 Essential (primary) hypertension: Secondary | ICD-10-CM

## 2013-01-13 DIAGNOSIS — R0602 Shortness of breath: Secondary | ICD-10-CM | POA: Diagnosis not present

## 2013-01-13 DIAGNOSIS — Z0000 Encounter for general adult medical examination without abnormal findings: Secondary | ICD-10-CM

## 2013-01-13 DIAGNOSIS — Z7901 Long term (current) use of anticoagulants: Secondary | ICD-10-CM | POA: Diagnosis not present

## 2013-01-13 DIAGNOSIS — I5032 Chronic diastolic (congestive) heart failure: Secondary | ICD-10-CM

## 2013-01-13 DIAGNOSIS — R1031 Right lower quadrant pain: Secondary | ICD-10-CM

## 2013-01-13 DIAGNOSIS — I25810 Atherosclerosis of coronary artery bypass graft(s) without angina pectoris: Secondary | ICD-10-CM | POA: Diagnosis not present

## 2013-01-13 DIAGNOSIS — R079 Chest pain, unspecified: Secondary | ICD-10-CM | POA: Diagnosis not present

## 2013-01-13 DIAGNOSIS — G894 Chronic pain syndrome: Secondary | ICD-10-CM | POA: Diagnosis not present

## 2013-01-13 DIAGNOSIS — R635 Abnormal weight gain: Secondary | ICD-10-CM | POA: Diagnosis not present

## 2013-01-13 DIAGNOSIS — T148 Other injury of unspecified body region: Secondary | ICD-10-CM | POA: Diagnosis not present

## 2013-01-13 DIAGNOSIS — B351 Tinea unguium: Secondary | ICD-10-CM | POA: Diagnosis not present

## 2013-01-13 MED ORDER — OXYCODONE-ACETAMINOPHEN 10-325 MG PO TABS: 1.0000 | ORAL_TABLET | Freq: Four times a day (QID) | ORAL | Status: DC | PRN

## 2013-01-13 MED ORDER — OMEPRAZOLE 20 MG PO CPDR: 20.0000 mg | DELAYED_RELEASE_CAPSULE | Freq: Every day | ORAL | Status: DC

## 2013-01-13 NOTE — Patient Instructions (Addendum)
   Please continue your medications as previously prescribed.    We will obtain your previous medical records prior to making a decision on prescribing percocet long-term for chronic pain--thank you for your understanding on this issue.    It was a pleasure to meet you, and we are glad to have you as a new patient in our clinic. Have a great day.

## 2013-01-13 NOTE — Progress Notes (Signed)
Subjective:    Patient ID: Juan Calderon, male    DOB: 1956-05-15, 57 y.o.   MRN: [id number]  HPI Patient is a 57 year old man with PMH significant for chronic diastolic CHF, paroxysmal atrial fibrillation (on warfarin anticoagulation), hypertension, who presents to clinic today to establish care with a PCP as he will be moving to [place] from [place], [place] in the near future. He states that his previous PCP is Dr. Luan Pulling of [place], [place], and that he most recently saw his PCP on 12/18/2012.   [name] follows with [facility] cardiology for his diastolic CHF and for his paroxysmal atrial fibrillation, and has his Coumadin managed at the associated coumadin clinic. He admits to chronic shortness of breath, which he states has been stable and unchanged recently despite a recent increase in his lasix dose. His weight has been stable over the last 2 months, and he denies any worsened orthopnea (4 pillow orthopnea at baseline) or any worsened leg swelling. He denies any recent episodes of chest pain.   Patient admits to right groin pain which has persisted for the last several months. He denies having noticed any mass in the area, and states that the pain is worst when positioning himself to lie back in bed.  Review of Systems  Constitutional: Negative for unexpected weight change.  HENT: Positive for rhinorrhea and sneezing.   Eyes: Negative.   Respiratory: Negative for wheezing.   Cardiovascular: Negative for leg swelling.  Gastrointestinal: Negative for abdominal distention.  Endocrine: Negative.   Genitourinary: Negative for hematuria.  Musculoskeletal: Positive for back pain and arthralgias.  Skin: Negative.   Neurological: Negative for light-headedness.  Hematological: Negative.   Psychiatric/Behavioral: Negative.     Current Outpatient Medications: Current Outpatient Prescriptions  Medication Sig Dispense Refill  . albuterol (PROVENTIL) (2.5 MG/3ML) 0.083% nebulizer solution Take 2.5 mg by  nebulization every 6 (six) hours as needed. For breathing      . allopurinol (ZYLOPRIM) 100 MG tablet Take 100 mg by mouth daily.      [name] atorvastatin (LIPITOR) 40 MG tablet Take 40 mg by mouth daily.      . cloNIDine (CATAPRES) 0.2 MG tablet Take 0.2 mg by mouth 2 (two) times daily.      . colchicine 0.6 MG tablet Take 0.6 mg by mouth daily.      . diclofenac (VOLTAREN) 50 MG EC tablet Take 50 mg by mouth 2 (two) times daily.       . digoxin (LANOXIN) 0.125 MG tablet Take 1 tablet (0.125 mg total) by mouth daily.  30 tablet  6  . esomeprazole (NEXIUM) 40 MG capsule Take 40 mg by mouth daily before breakfast.      . furosemide (LASIX) 20 MG tablet Take 1 tablet (20 mg total) by mouth 2 (two) times daily.  14 tablet  0  . furosemide (LASIX) 40 MG tablet Take 1 tablet (40 mg total) by mouth 2 (two) times daily.  180 tablet  3  . hydrochlorothiazide (HYDRODIURIL) 25 MG tablet Take 25 mg by mouth daily.      . metoprolol (LOPRESSOR) 50 MG tablet Take 1 tablet (50 mg total) by mouth 2 (two) times daily.  180 tablet  3  . misoprostol (CYTOTEC) 200 MCG tablet Take 1 tablet (200 mcg total) by mouth daily.  30 tablet  0  . nitroGLYCERIN (NITROSTAT) 0.4 MG SL tablet Place 0.4 mg under the tongue every 5 (five) minutes as needed.      [name] oxyCODONE-acetaminophen (  PERCOCET) 10-325 MG per tablet Take 1 tablet by mouth every 6 (six) hours as needed for pain.  120 tablet  0  . testosterone cypionate (DEPOTESTOTERONE CYPIONATE) 200 MG/ML injection Injection 400 ml  Once a month      . valsartan (DIOVAN) 160 MG tablet Take 160 mg by mouth daily.      [name] warfarin (COUMADIN) 5 MG tablet Take 2.5-5 mg by mouth daily. Take 1/2 tablet on Monday, Wednesday and Friday then take 1 tablet the other other days       No current facility-administered medications for this visit.    Allergies: Allergies  Allergen Reactions  . Ramipril   . Testosterone      Past Medical History: Past Medical History  Diagnosis Date  .  Hypertension   . Acid reflux   . Hyperlipidemia   . Asthma   . Diabetes mellitus   . Atrial fibrillation     On coumadin  . Diastolic CHF     Normal LVEF by echo 02/2010  . Hypertensive heart disease     Mild LVH by echo 02/2010  . CAD (coronary artery disease)     25% LAD stenosis by cath 2007    Past Surgical History: Past Surgical History  Procedure Laterality Date  . Left leg surgery    . Left arm surgery    . Shoulder surgery      Right    Family History: Family History  Problem Relation Age of Onset  . Heart failure Mother   . Dementia Father     Social History: History   Social History  . Marital Status: Widowed    Spouse Name: N/A    Number of Children: N/A  . Years of Education: N/A   Occupational History  . Not on file.   Social History Main Topics  . Smoking status: Never Smoker   . Smokeless tobacco: Never Used  . Alcohol Use: 1.0 oz/week    2 drink(s) per week  . Drug Use: No  . Sexually Active: Not on file   Other Topics Concern  . Not on file   Social History Narrative  . No narrative on file     Vital Signs: There were no vitals taken for this visit.      Objective:   Physical Exam  Constitutional: He is oriented to person, place, and time. He appears well-developed and well-nourished. No distress.  HENT:  Head: Normocephalic and atraumatic.  Eyes: Conjunctivae are normal. Pupils are equal, round, and reactive to light. No scleral icterus.  Neck: Normal range of motion. Neck supple. No tracheal deviation present.  Cardiovascular: Normal rate and regular rhythm.   No murmur heard. Pulmonary/Chest: Effort normal and breath sounds normal. He has no wheezes.  Abdominal: Soft. Bowel sounds are normal. He exhibits no distension. There is no tenderness. Hernia confirmed negative in the right inguinal area and confirmed negative in the left inguinal area.  Musculoskeletal: Normal range of motion. He exhibits no edema.  Lymphadenopathy:        Right: No inguinal adenopathy present.       Left: No inguinal adenopathy present.  Neurological: He is alert and oriented to person, place, and time. No cranial nerve deficit.  Skin: Skin is warm and dry.  Multiple large vertical scars along the LLE between the knee and ankle.   Psychiatric: He has a normal mood and affect. His behavior is normal.  Assessment & Plan:

## 2013-01-14 ENCOUNTER — Ambulatory Visit (INDEPENDENT_AMBULATORY_CARE_PROVIDER_SITE_OTHER): Payer: Medicare Other | Admitting: *Deleted

## 2013-01-14 DIAGNOSIS — Z7901 Long term (current) use of anticoagulants: Secondary | ICD-10-CM | POA: Diagnosis not present

## 2013-01-14 DIAGNOSIS — I4891 Unspecified atrial fibrillation: Secondary | ICD-10-CM

## 2013-01-14 LAB — POCT INR: INR: 3.9

## 2013-01-15 ENCOUNTER — Ambulatory Visit: Payer: Medicare Other | Admitting: Physical Therapy

## 2013-01-15 DIAGNOSIS — G894 Chronic pain syndrome: Secondary | ICD-10-CM | POA: Insufficient documentation

## 2013-01-15 DIAGNOSIS — R1031 Right lower quadrant pain: Secondary | ICD-10-CM | POA: Insufficient documentation

## 2013-01-15 DIAGNOSIS — Z0000 Encounter for general adult medical examination without abnormal findings: Secondary | ICD-10-CM | POA: Insufficient documentation

## 2013-01-15 NOTE — Assessment & Plan Note (Addendum)
Pt had an echo in 02/2010 which revealed mild LVH with EF = 55%. He had a myoview in 2007 which revealed reversible inferior ischemia, however the subsequent cardiac cath was revealing only of 25% stenosis of the mid-LAD and no intervention was pursued. A repeat stress test in 2013 performed at [facility] was within normal limits. As his diastolic CHF appears mild, it is unclear to what degree this issue is contributing to the patient's complaints of dyspnea, and previous cardiology notes indicate that it is believed his dyspnea is multifactorial. Patient relates he has been fully compliant with all of his medications, and no med changes were made during today's visit as he has no signs/symptoms of CHF decompensation. - cont home meds:  lasix 60mg  bid  clonidine 0.2mg  bid  digoxin 0.125mg  qd  HCTZ 25mg  qd  metoprolol 50mg  bid  diovan 160mg  qd

## 2013-01-15 NOTE — Assessment & Plan Note (Addendum)
Patient complains of right groin pain x3 months which he indicates occurs roughly in the area along the inferior border of the inguinal ligament. He states he only has pain with certain movements (e.g. Getting in bed), and has no baseline discomfort in the area. No masses or tenderness to palpation. Issue is likely secondary MSK strain in the area but could also be from R hip OA (given body habitus and hx of OA in other joints). As pain symptoms are mild, intermittent, and have not worsened since onset, will continue to monitor clinically at this time, however will consider other etiologies if these symptoms change in quality or severity.  - cont percocet - reassess at f/u in 1 month

## 2013-01-15 NOTE — Assessment & Plan Note (Signed)
Pt has been fully compliant with warfarin as prescribed for paroxysmal atrial fibrillation. Follows at the [facility] coumadin clinic. Most recent INR prior to visit = 2.9 (4/16). - cont warfarin (management per [facility] coumadin clinic)

## 2013-01-15 NOTE — Assessment & Plan Note (Signed)
NSR during today's visit. No complaints of recent palpitations. Therapeutic on warfarin anticoagulation. Patient is rate controlled on digoxin and metoprolol per Dr. Clifton James (dig had been started after lopressor was d/c'd 2/2 fatigue, however it has since been restarted as well).  - cont metoprolol 50mg  bid - cont digoxin 0.125mg  qd - cont warfarin

## 2013-01-15 NOTE — Assessment & Plan Note (Addendum)
Pt states he has chronic pain in his LLE below the knee (post-traumatic after remote accident) and also in his lower back and bilateral shoulders, for which he takes percocet 10-325mg  4x daily. He states that his pain has been well-controlled on this dose/frequency, which has been unchanged for at least 7 years. He does not appear to have any contraindications to continued long-term opioids, however will defer the decision of starting a long-term controlled substances contract to the PCP to whom the patient is assigned following today's visit. He was given a one-month refill for percocet while previous medical records are being obtained.  - percocet 10-325 qid (#120) - f/u with PCP in 1 month to address long-term pain management with opioids through the [facility] (and also to fulfill health maintenance items as indicated by previous medical records if available at that time)

## 2013-01-15 NOTE — Assessment & Plan Note (Signed)
BP Readings from Last 3 Encounters:  12/24/12 134/71  11/27/12 122/80  11/20/12 122/78    Lab Results  Component Value Date   NA 138 12/05/2012   K 4.2 12/05/2012   CREATININE 1.5 12/05/2012    Assessment: Blood pressure control: controlled Progress toward BP goal:  at goal Comments:   Plan: Medications:  continue current medications Educational resources provided:   Self management tools provided:   Other plans:

## 2013-01-15 NOTE — Assessment & Plan Note (Signed)
Will obtain records from previous PCP (Dr. Luan Pulling in [place], [place]) to determine what health maintenance items need to be addressed. Notably, the patient checks his blood sugar daily but has never been on any anti-hyperglycemics and the printout from the last month reveals that all readings were within normal range-- he was therefore instructed to discontinue this practice for now unless his previous records reveal an indication.

## 2013-01-16 NOTE — Progress Notes (Signed)
Case discussed with Dr. McTyre soon after the resident saw the patient.  We reviewed the resident's history and exam and pertinent patient test results.  I agree with the assessment, diagnosis and plan of care documented in the resident's note. 

## 2013-01-19 ENCOUNTER — Ambulatory Visit: Payer: Medicare Other | Admitting: Physical Therapy

## 2013-01-21 ENCOUNTER — Ambulatory Visit: Payer: Medicare Other | Admitting: Physical Therapy

## 2013-02-01 DIAGNOSIS — Z87898 Personal history of other specified conditions: Secondary | ICD-10-CM | POA: Diagnosis not present

## 2013-02-02 ENCOUNTER — Ambulatory Visit (INDEPENDENT_AMBULATORY_CARE_PROVIDER_SITE_OTHER): Payer: Medicare Other | Admitting: Pharmacist

## 2013-02-02 DIAGNOSIS — Z7901 Long term (current) use of anticoagulants: Secondary | ICD-10-CM | POA: Diagnosis not present

## 2013-02-02 DIAGNOSIS — I4891 Unspecified atrial fibrillation: Secondary | ICD-10-CM

## 2013-02-02 LAB — POCT INR: INR: 2.6

## 2013-02-19 ENCOUNTER — Other Ambulatory Visit: Payer: Self-pay | Admitting: *Deleted

## 2013-02-19 NOTE — Telephone Encounter (Signed)
Also pt states he needs an refill on - Fenofibrate 160mg , 1 tab daily. No available appts w/Dr Josem Kaufmann until August - pt knows to call back next month to schedule an appt. Call [phone] when rxs are ready.

## 2013-02-23 ENCOUNTER — Ambulatory Visit (INDEPENDENT_AMBULATORY_CARE_PROVIDER_SITE_OTHER): Payer: Medicare Other | Admitting: *Deleted

## 2013-02-23 DIAGNOSIS — I4891 Unspecified atrial fibrillation: Secondary | ICD-10-CM | POA: Diagnosis not present

## 2013-02-23 DIAGNOSIS — Z7901 Long term (current) use of anticoagulants: Secondary | ICD-10-CM | POA: Diagnosis not present

## 2013-02-23 DIAGNOSIS — M75120 Complete rotator cuff tear or rupture of unspecified shoulder, not specified as traumatic: Secondary | ICD-10-CM | POA: Diagnosis not present

## 2013-02-23 LAB — POCT INR: INR: 4.9

## 2013-02-23 MED ORDER — OXYCODONE-ACETAMINOPHEN 10-325 MG PO TABS: 1.0000 | ORAL_TABLET | Freq: Four times a day (QID) | ORAL | Status: DC | PRN

## 2013-02-23 MED ORDER — COLCHICINE 0.6 MG PO TABS: 0.6000 mg | ORAL_TABLET | Freq: Every day | ORAL | Status: DC

## 2013-02-23 MED ORDER — ESOMEPRAZOLE MAGNESIUM 40 MG PO CPDR: 40.0000 mg | DELAYED_RELEASE_CAPSULE | Freq: Every day | ORAL | Status: DC

## 2013-03-03 DIAGNOSIS — Z87898 Personal history of other specified conditions: Secondary | ICD-10-CM | POA: Diagnosis not present

## 2013-03-05 ENCOUNTER — Ambulatory Visit (INDEPENDENT_AMBULATORY_CARE_PROVIDER_SITE_OTHER): Payer: Medicare Other | Admitting: *Deleted

## 2013-03-05 DIAGNOSIS — I4891 Unspecified atrial fibrillation: Secondary | ICD-10-CM

## 2013-03-05 DIAGNOSIS — Z7901 Long term (current) use of anticoagulants: Secondary | ICD-10-CM | POA: Diagnosis not present

## 2013-03-05 LAB — POCT INR: INR: 3.1

## 2013-03-26 ENCOUNTER — Ambulatory Visit (INDEPENDENT_AMBULATORY_CARE_PROVIDER_SITE_OTHER): Payer: Medicare Other | Admitting: *Deleted

## 2013-03-26 DIAGNOSIS — Z7901 Long term (current) use of anticoagulants: Secondary | ICD-10-CM

## 2013-03-26 DIAGNOSIS — I4891 Unspecified atrial fibrillation: Secondary | ICD-10-CM | POA: Diagnosis not present

## 2013-03-30 ENCOUNTER — Other Ambulatory Visit: Payer: Self-pay | Admitting: Physician Assistant

## 2013-03-30 DIAGNOSIS — B351 Tinea unguium: Secondary | ICD-10-CM | POA: Diagnosis not present

## 2013-03-30 DIAGNOSIS — M79609 Pain in unspecified limb: Secondary | ICD-10-CM | POA: Diagnosis not present

## 2013-03-31 ENCOUNTER — Other Ambulatory Visit: Payer: Self-pay | Admitting: Internal Medicine

## 2013-03-31 ENCOUNTER — Other Ambulatory Visit: Payer: Self-pay | Admitting: Physician Assistant

## 2013-03-31 DIAGNOSIS — I4891 Unspecified atrial fibrillation: Secondary | ICD-10-CM

## 2013-03-31 NOTE — Telephone Encounter (Signed)
At the last clinic visit with Dr. Lavena Bullion there was no mention of fenofibrate or terazosin use.  These will not be prescribed until he provides documentation that he is taking these medications and for what indication.  The alternative is to wait until he is seen for the initial visit with his new primary care provider.  Cytotec has already been prescribed (at a higher dose).

## 2013-03-31 NOTE — Telephone Encounter (Signed)
Terazosin? I dont see this on the med list and do we fill this med at all?

## 2013-03-31 NOTE — Telephone Encounter (Signed)
I have yet to see Juan Calderon and there are 2 different doses of diovan listed in the chart.  Please call the patient and clarify the dose of diovan he is currently taking.  Thanks.

## 2013-04-01 ENCOUNTER — Telehealth: Payer: Self-pay | Admitting: *Deleted

## 2013-04-01 NOTE — Telephone Encounter (Signed)
lmptcb to verify about refill for Terazosin (hytrin). Brandy CMA sent refill message to me to verify. I also do not see Hytrin anywhere on the chart.

## 2013-04-01 NOTE — Telephone Encounter (Signed)
I called [facility] and s/w Pharm Tech and asked who is the original provider who ordered Hytrin (Terazosin), she states Dr. Rich. Then she said was sent to Scott  Weaver, PAC. I expalined we do not have anywhere in pt's chart Terazosin and this will need to be refilled with Dr. Rich who originally ordered Rx. Tech said ok and will let the pt know of this. 

## 2013-04-01 NOTE — Telephone Encounter (Signed)
I called [facility] and s/w Pharm Tech and asked who is the original provider who ordered Hytrin (Terazosin), she states Dr. Luan Pulling. Then she said was sent to Tereso Newcomer, [facility]. I expalined we do not have anywhere in pt's chart Terazosin and this will need to be refilled with Dr. Luan Pulling who originally ordered Rx. Tech said ok and will let the pt know of this.

## 2013-04-01 NOTE — Telephone Encounter (Signed)
lmptcb to verify about refill for Terazosin (hytrin). Brandy CMA sent refill message to me to verify. I also do not see Hytrin anywhere on the chart. 

## 2013-04-02 DIAGNOSIS — E119 Type 2 diabetes mellitus without complications: Secondary | ICD-10-CM | POA: Diagnosis not present

## 2013-04-02 DIAGNOSIS — H40019 Open angle with borderline findings, low risk, unspecified eye: Secondary | ICD-10-CM | POA: Diagnosis not present

## 2013-04-02 DIAGNOSIS — H05249 Constant exophthalmos, unspecified eye: Secondary | ICD-10-CM | POA: Diagnosis not present

## 2013-04-02 DIAGNOSIS — H2510 Age-related nuclear cataract, unspecified eye: Secondary | ICD-10-CM | POA: Diagnosis not present

## 2013-04-02 DIAGNOSIS — H0289 Other specified disorders of eyelid: Secondary | ICD-10-CM | POA: Diagnosis not present

## 2013-04-02 DIAGNOSIS — H4011X0 Primary open-angle glaucoma, stage unspecified: Secondary | ICD-10-CM | POA: Diagnosis not present

## 2013-04-03 ENCOUNTER — Other Ambulatory Visit: Payer: Self-pay | Admitting: *Deleted

## 2013-04-03 DIAGNOSIS — Z87898 Personal history of other specified conditions: Secondary | ICD-10-CM | POA: Diagnosis not present

## 2013-04-03 MED ORDER — OXYCODONE-ACETAMINOPHEN 10-325 MG PO TABS: 1.0000 | ORAL_TABLET | Freq: Four times a day (QID) | ORAL | Status: DC | PRN

## 2013-04-03 NOTE — Telephone Encounter (Signed)
Pt # [id number] Pt states he is out of percocet.  Last filled by Dr Josem Kaufmann 6/19.  Pharmacy did not call in for refill, pt asked them to. Pt asked about other refills that were requested - Cytotec, Hytrin, and Fenofibrate.  He said he was getting these from Dr Evert Kohl and they are all listed in his medical record he had sent to Dr Josem Kaufmann. He is out of these also and is asking for refill on all three. Pt has appointment with Dr Josem Kaufmann 8/22

## 2013-04-03 NOTE — Telephone Encounter (Signed)
I called patient and discussed the refill request and tried to clarify what he has been taking; he indicated that the Hytrin and fenofibrate may have been stopped, and it looks like his last Cytotec refill was for 30 tablets in January.  I advised him that he will need to be seen and have his med list clarified before considering refilling those meds.  Dr. Josem Kaufmann refilled his oxycodone/acetaminophen in June, and patient has an appointment with him on 8/22; I am willing to refill that medication, and I advised him that he will need to pick it up on Monday.  Oxycodone/acetaminophen refill was printed and signed - nurse to complete.

## 2013-04-04 ENCOUNTER — Other Ambulatory Visit: Payer: Self-pay | Admitting: Cardiovascular Disease

## 2013-04-04 ENCOUNTER — Other Ambulatory Visit: Payer: Self-pay | Admitting: Internal Medicine

## 2013-04-06 NOTE — Telephone Encounter (Signed)
He got misoprosotol once per cards. We have never filled. Denied. He has never gotten Hytrin from [place] and not in [facility] in past. Denied

## 2013-04-06 NOTE — Telephone Encounter (Signed)
Rx given to pt. 

## 2013-04-07 ENCOUNTER — Telehealth: Payer: Self-pay | Admitting: *Deleted

## 2013-04-07 NOTE — Telephone Encounter (Signed)
pt called about terazosin, let him know that we did not fill this med per ph msg 04/01/13

## 2013-04-09 ENCOUNTER — Ambulatory Visit (INDEPENDENT_AMBULATORY_CARE_PROVIDER_SITE_OTHER): Payer: Medicare Other

## 2013-04-09 DIAGNOSIS — I4891 Unspecified atrial fibrillation: Secondary | ICD-10-CM | POA: Diagnosis not present

## 2013-04-09 DIAGNOSIS — Z7901 Long term (current) use of anticoagulants: Secondary | ICD-10-CM

## 2013-04-13 NOTE — Telephone Encounter (Signed)
Diovan was refilled by patient's cardiologist on 7/28.

## 2013-04-24 ENCOUNTER — Ambulatory Visit (INDEPENDENT_AMBULATORY_CARE_PROVIDER_SITE_OTHER): Payer: Medicare Other | Admitting: Internal Medicine

## 2013-04-24 VITALS — BP 109/72 | HR 58 | Temp 97.1°F | Wt 290.6 lb

## 2013-04-24 DIAGNOSIS — R7309 Other abnormal glucose: Secondary | ICD-10-CM | POA: Diagnosis not present

## 2013-04-24 DIAGNOSIS — M47812 Spondylosis without myelopathy or radiculopathy, cervical region: Secondary | ICD-10-CM | POA: Diagnosis not present

## 2013-04-24 DIAGNOSIS — I10 Essential (primary) hypertension: Secondary | ICD-10-CM | POA: Diagnosis not present

## 2013-04-24 DIAGNOSIS — I25810 Atherosclerosis of coronary artery bypass graft(s) without angina pectoris: Secondary | ICD-10-CM | POA: Diagnosis not present

## 2013-04-24 DIAGNOSIS — R635 Abnormal weight gain: Secondary | ICD-10-CM | POA: Diagnosis not present

## 2013-04-24 DIAGNOSIS — J300 Vasomotor rhinitis: Secondary | ICD-10-CM

## 2013-04-24 DIAGNOSIS — J45909 Unspecified asthma, uncomplicated: Secondary | ICD-10-CM | POA: Diagnosis not present

## 2013-04-24 DIAGNOSIS — E785 Hyperlipidemia, unspecified: Secondary | ICD-10-CM

## 2013-04-24 DIAGNOSIS — G894 Chronic pain syndrome: Secondary | ICD-10-CM

## 2013-04-24 DIAGNOSIS — E119 Type 2 diabetes mellitus without complications: Secondary | ICD-10-CM

## 2013-04-24 DIAGNOSIS — T148XXA Other injury of unspecified body region, initial encounter: Secondary | ICD-10-CM | POA: Diagnosis not present

## 2013-04-24 DIAGNOSIS — I5032 Chronic diastolic (congestive) heart failure: Secondary | ICD-10-CM | POA: Diagnosis not present

## 2013-04-24 DIAGNOSIS — K219 Gastro-esophageal reflux disease without esophagitis: Secondary | ICD-10-CM | POA: Diagnosis not present

## 2013-04-24 DIAGNOSIS — D649 Anemia, unspecified: Secondary | ICD-10-CM | POA: Diagnosis not present

## 2013-04-24 DIAGNOSIS — B351 Tinea unguium: Secondary | ICD-10-CM | POA: Diagnosis not present

## 2013-04-24 DIAGNOSIS — R0602 Shortness of breath: Secondary | ICD-10-CM | POA: Diagnosis not present

## 2013-04-24 DIAGNOSIS — I4891 Unspecified atrial fibrillation: Secondary | ICD-10-CM

## 2013-04-24 DIAGNOSIS — T148 Other injury of unspecified body region: Secondary | ICD-10-CM | POA: Diagnosis not present

## 2013-04-24 DIAGNOSIS — M109 Gout, unspecified: Secondary | ICD-10-CM | POA: Diagnosis not present

## 2013-04-24 DIAGNOSIS — Z0000 Encounter for general adult medical examination without abnormal findings: Secondary | ICD-10-CM | POA: Diagnosis not present

## 2013-04-24 DIAGNOSIS — H04123 Dry eye syndrome of bilateral lacrimal glands: Secondary | ICD-10-CM

## 2013-04-24 DIAGNOSIS — H409 Unspecified glaucoma: Secondary | ICD-10-CM

## 2013-04-24 DIAGNOSIS — I209 Angina pectoris, unspecified: Secondary | ICD-10-CM | POA: Diagnosis not present

## 2013-04-24 DIAGNOSIS — N179 Acute kidney failure, unspecified: Secondary | ICD-10-CM

## 2013-04-24 DIAGNOSIS — Z7901 Long term (current) use of anticoagulants: Secondary | ICD-10-CM | POA: Diagnosis not present

## 2013-04-24 DIAGNOSIS — H04129 Dry eye syndrome of unspecified lacrimal gland: Secondary | ICD-10-CM

## 2013-04-24 DIAGNOSIS — H4010X0 Unspecified open-angle glaucoma, stage unspecified: Secondary | ICD-10-CM

## 2013-04-24 DIAGNOSIS — R079 Chest pain, unspecified: Secondary | ICD-10-CM | POA: Diagnosis not present

## 2013-04-24 DIAGNOSIS — I2510 Atherosclerotic heart disease of native coronary artery without angina pectoris: Secondary | ICD-10-CM | POA: Diagnosis not present

## 2013-04-24 DIAGNOSIS — J309 Allergic rhinitis, unspecified: Secondary | ICD-10-CM | POA: Diagnosis not present

## 2013-04-24 DIAGNOSIS — N529 Male erectile dysfunction, unspecified: Secondary | ICD-10-CM

## 2013-04-24 DIAGNOSIS — R1031 Right lower quadrant pain: Secondary | ICD-10-CM | POA: Diagnosis not present

## 2013-04-24 LAB — LIPID PANEL
LDL Cholesterol: 25 mg/dL (ref 0–99)
Triglycerides: 276 mg/dL — ABNORMAL HIGH (ref <150)
VLDL: 55 mg/dL — ABNORMAL HIGH (ref 0–40)

## 2013-04-24 LAB — COMPLETE METABOLIC PANEL WITH GFR
ALT: 25 U/L (ref 0–53)
AST: 59 U/L — ABNORMAL HIGH (ref 0–37)
Albumin: 4.6 g/dL (ref 3.5–5.2)
CO2: 32 mEq/L (ref 19–32)
Calcium: 10 mg/dL (ref 8.4–10.5)
Chloride: 100 mEq/L (ref 96–112)
Creat: 2.4 mg/dL — ABNORMAL HIGH (ref 0.50–1.35)
GFR, Est African American: 33 mL/min — ABNORMAL LOW
Potassium: 4 mEq/L (ref 3.5–5.3)
Total Protein: 7.2 g/dL (ref 6.0–8.3)

## 2013-04-24 LAB — URIC ACID: Uric Acid, Serum: 7.1 mg/dL (ref 4.0–7.8)

## 2013-04-24 MED ORDER — FENOFIBRATE 160 MG PO TABS: 160.0000 mg | ORAL_TABLET | Freq: Every day | ORAL | Status: DC

## 2013-04-24 MED ORDER — NITROGLYCERIN 0.4 MG SL SUBL: 0.4000 mg | SUBLINGUAL_TABLET | SUBLINGUAL | Status: DC | PRN

## 2013-04-24 MED ORDER — DICLOFENAC SODIUM 50 MG PO TBEC: 50.0000 mg | DELAYED_RELEASE_TABLET | Freq: Two times a day (BID) | ORAL | Status: DC

## 2013-04-24 MED ORDER — ESOMEPRAZOLE MAGNESIUM 40 MG PO CPDR: 40.0000 mg | DELAYED_RELEASE_CAPSULE | Freq: Every day | ORAL | Status: DC

## 2013-04-24 MED ORDER — ALLOPURINOL 100 MG PO TABS: 200.0000 mg | ORAL_TABLET | Freq: Every day | ORAL | Status: DC

## 2013-04-24 MED ORDER — CLONIDINE HCL 0.2 MG PO TABS: 0.2000 mg | ORAL_TABLET | Freq: Two times a day (BID) | ORAL | Status: DC

## 2013-04-24 MED ORDER — BIMATOPROST 0.01 % OP SOLN: 1.0000 [drp] | Freq: Every day | OPHTHALMIC | Status: DC

## 2013-04-24 MED ORDER — MISOPROSTOL 200 MCG PO TABS: 200.0000 ug | ORAL_TABLET | Freq: Every day | ORAL | Status: DC

## 2013-04-24 MED ORDER — ARTIFICIAL TEARS OP OINT: TOPICAL_OINTMENT | Freq: Four times a day (QID) | OPHTHALMIC | Status: DC | PRN

## 2013-04-24 MED ORDER — COLCHICINE 0.6 MG PO TABS: 0.6000 mg | ORAL_TABLET | Freq: Every day | ORAL | Status: DC

## 2013-04-24 MED ORDER — ALBUTEROL SULFATE HFA 108 (90 BASE) MCG/ACT IN AERS: 2.0000 | INHALATION_SPRAY | Freq: Four times a day (QID) | RESPIRATORY_TRACT | Status: DC | PRN

## 2013-04-24 MED ORDER — LORATADINE 10 MG PO TABS
10.0000 mg | ORAL_TABLET | Freq: Every day | ORAL | Status: DC
Start: 2013-04-24 — End: 2013-12-22

## 2013-04-24 MED ORDER — ATORVASTATIN CALCIUM 40 MG PO TABS: 40.0000 mg | ORAL_TABLET | Freq: Every day | ORAL | Status: DC

## 2013-04-24 MED ORDER — OXYCODONE-ACETAMINOPHEN 10-325 MG PO TABS: 1.0000 | ORAL_TABLET | Freq: Four times a day (QID) | ORAL | Status: DC | PRN

## 2013-04-24 MED ORDER — WARFARIN SODIUM 5 MG PO TABS: ORAL_TABLET | ORAL | Status: DC

## 2013-04-24 MED ORDER — FERROUS SULFATE 325 (65 FE) MG PO TABS: 325.0000 mg | ORAL_TABLET | Freq: Every day | ORAL | Status: DC

## 2013-04-24 NOTE — Patient Instructions (Signed)
It was nice to meet you today.  I look forward to caring for you for years to come!  We spent the whole visit reviewing, correcting, and re-prescribing your medications.  I am hopeful that at the next visit we can concentrate on reviewing your medical history in more detail and managing your chronic diseases.  I will see you back at my next available appointment.

## 2013-04-25 ENCOUNTER — Encounter: Payer: Self-pay | Admitting: Internal Medicine

## 2013-04-25 DIAGNOSIS — K219 Gastro-esophageal reflux disease without esophagitis: Secondary | ICD-10-CM | POA: Insufficient documentation

## 2013-04-25 DIAGNOSIS — M75101 Unspecified rotator cuff tear or rupture of right shoulder, not specified as traumatic: Secondary | ICD-10-CM

## 2013-04-25 DIAGNOSIS — E785 Hyperlipidemia, unspecified: Secondary | ICD-10-CM | POA: Insufficient documentation

## 2013-04-25 DIAGNOSIS — E1140 Type 2 diabetes mellitus with diabetic neuropathy, unspecified: Secondary | ICD-10-CM | POA: Insufficient documentation

## 2013-04-25 DIAGNOSIS — H4010X0 Unspecified open-angle glaucoma, stage unspecified: Secondary | ICD-10-CM | POA: Insufficient documentation

## 2013-04-25 DIAGNOSIS — M1A00X0 Idiopathic chronic gout, unspecified site, without tophus (tophi): Secondary | ICD-10-CM | POA: Insufficient documentation

## 2013-04-25 DIAGNOSIS — M47812 Spondylosis without myelopathy or radiculopathy, cervical region: Secondary | ICD-10-CM | POA: Insufficient documentation

## 2013-04-25 DIAGNOSIS — J449 Chronic obstructive pulmonary disease, unspecified: Secondary | ICD-10-CM | POA: Insufficient documentation

## 2013-04-25 DIAGNOSIS — J300 Vasomotor rhinitis: Secondary | ICD-10-CM | POA: Insufficient documentation

## 2013-04-25 DIAGNOSIS — I25119 Atherosclerotic heart disease of native coronary artery with unspecified angina pectoris: Secondary | ICD-10-CM | POA: Insufficient documentation

## 2013-04-25 DIAGNOSIS — N521 Erectile dysfunction due to diseases classified elsewhere: Secondary | ICD-10-CM | POA: Insufficient documentation

## 2013-04-25 HISTORY — DX: Vasomotor rhinitis: J30.0

## 2013-04-25 HISTORY — DX: Unspecified rotator cuff tear or rupture of right shoulder, not specified as traumatic: M75.101

## 2013-04-25 LAB — MICROALBUMIN / CREATININE URINE RATIO: Microalb Creat Ratio: 9 mg/g (ref 0.0–30.0)

## 2013-04-25 MED ORDER — LATANOPROST 0.005 % OP SOLN: 1.0000 [drp] | Freq: Every day | OPHTHALMIC | Status: DC

## 2013-04-25 MED ORDER — ALLOPURINOL 300 MG PO TABS: 300.0000 mg | ORAL_TABLET | Freq: Every day | ORAL | Status: DC

## 2013-04-25 MED ORDER — FUROSEMIDE 40 MG PO TABS: 40.0000 mg | ORAL_TABLET | Freq: Every day | ORAL | Status: DC

## 2013-04-25 MED ORDER — FUROSEMIDE 20 MG PO TABS: 20.0000 mg | ORAL_TABLET | Freq: Every day | ORAL | Status: DC

## 2013-04-25 NOTE — Progress Notes (Signed)
  Subjective:    Patient ID: Juan Calderon, male    DOB: 1956-06-19, 57 y.o.   MRN: [id number]  HPI  We spent nearly all of the visit reconciling his multiple medications and writing new prescriptions as he was new to the clinic and had numerous discrepancies in his medication list.  Please see the A&P for the status of the pt's chronic medical problems.  Review of Systems  HENT: Positive for congestion, rhinorrhea, sneezing, neck pain, postnasal drip and sinus pressure.   Eyes: Positive for itching.  Respiratory: Positive for chest tightness and shortness of breath.   Cardiovascular: Positive for palpitations.  Endocrine: Negative for polyuria.  Genitourinary: Negative for frequency.  Musculoskeletal: Positive for myalgias and arthralgias.  Skin: Negative for color change, pallor, rash and wound.  Allergic/Immunologic: Positive for environmental allergies.  Neurological: Positive for dizziness and light-headedness. Negative for syncope.       Objective:   Physical Exam  Nursing note and vitals reviewed. Constitutional: He is oriented to person, place, and time. He appears well-developed and well-nourished. No distress.  HENT:  Head: Normocephalic and atraumatic.  Pulmonary/Chest: Effort normal. No respiratory distress.  Musculoskeletal: He exhibits no edema.  Neurological: He is alert and oriented to person, place, and time.  Skin: He is not diaphoretic.  Psychiatric: He has a normal mood and affect. His behavior is normal. Judgment and thought content normal.   LABS  BMP: K 4.0, BUN 38, Creatinine 2.4 LFTs unremarkable except for AST 59 Total cholesterol 128 Triglycerides 276 HDL 48 LDL 25 Iron 177, ferritin 5284 Digoxin 0.8 Urine microalbumin/creatinine ratio 9.0    Assessment & Plan:   Please see problem oriented charting.

## 2013-04-25 NOTE — Assessment & Plan Note (Signed)
Clinically he is overdiuresed.  Will decrease the lasix to 60 mg PO QAM and continue the beta blocker and ACEI as well as the other antihypertensives.

## 2013-04-25 NOTE — Assessment & Plan Note (Signed)
Blood pressure at target on valsartan, metoprolol, terazosin, clonidine, and BID lasix.  Will continue the current regimen except to change lasix to QAM given acute renal insufficiency likely related to over diuresis.  Eventually, I would like to wean off the clonidine and titrate the terazosin upwards as tolerated.  The current lightheadedness is probably secondary to over diuresis as well.

## 2013-04-25 NOTE — Assessment & Plan Note (Signed)
As with his chronic pain syndrome, we will continue the diclofenac for his cervical osteoarthritis.

## 2013-04-25 NOTE — Assessment & Plan Note (Signed)
Currently symptomatic.  Will try Claritin for the vasomotor rhinitis.  If the symptoms persists will consider the addition of a nasal steroid such as beclomethasone at the follow-up visit.

## 2013-04-25 NOTE — Assessment & Plan Note (Signed)
Rate controlled with metoprolol and digoxin.  Will continue current regimen but consider increasing the metoprolol dose and discontinuing the digoxin in the future, especially since his heart failure is diastolic rather than systolic in nature.

## 2013-04-25 NOTE — Assessment & Plan Note (Signed)
Pain is fairly well controlled on the percocet and diclofenac.  Will continue this regimen.  At the net visit will have Mr. Kohlenberg sign a formal pain contract.

## 2013-04-25 NOTE — Assessment & Plan Note (Signed)
Uric acid is 7.1 on allopurinol 200 mg PO QD.  This is above the target uric acid of 6.0.  Will increase the allopurinol to 300 mg PO QD and recheck the uric acid at the follow-up visit.  In the meantime, we will continue the colchicine 0.6 mg PO QD for 6 months past the time his uric acid drops below 6.0 before discontinuing it.

## 2013-04-25 NOTE — Assessment & Plan Note (Signed)
Diet controlled with a Hgb A1C of 6.5.  Will continue blood pressure control with ACEI and lipid control with statin.  Last diabetic eye examination was 04/02/2013 with Dr. Dione Booze.  Microalbumin/Creatine ratio not elevated.  Will perform a formal diabetic foot examination at the follow-up visit.

## 2013-04-25 NOTE — Assessment & Plan Note (Signed)
Dyspnea seems to be a persistent problem.  The asthma diagnosis is clinical and may not be accurate.  We will obtain formal PFTs to assess his pulmonary status and possibly better define the reasons for his dyspnea if the lungs are playing a role.  His inhaler regimen will be modified as appropriate.  If he requires continued albuterol we will review inhaler technique and provide him with a spacer which we will encourage him to use.

## 2013-04-25 NOTE — Assessment & Plan Note (Signed)
Symptoms are well controlled on the esomeprazole at 40 mg PO QD.  He remained symptomatic on the 20 mg dose.

## 2013-04-25 NOTE — Assessment & Plan Note (Signed)
LDL cholesterol is 25 on atorvastatin 40 mg PO QD.  This is at target.  Therefore, we will continue the atorvastatin at the current dose.

## 2013-04-25 NOTE — Assessment & Plan Note (Signed)
[name] is apparently confused about the most recent change made in his glaucoma regimen.  Dr. Dione Booze recently stopped the Lumigan and started Latanoprost 1 drop in each eye QPM.  Will therefore make this change in his regimen.

## 2013-04-25 NOTE — Assessment & Plan Note (Signed)
He has stable angina at this point.  Will continue the metoprolol and the SL NTG PRN.  In the future, I hope to increase the dose of the metoprolol to get better control of his anginal symptoms.  He is not on an ASA QD because of his coumadin therapy.

## 2013-04-25 NOTE — Assessment & Plan Note (Signed)
The iron and ferritin levels are within normal and he is not anemic.  Therefore we will stop the ferrous sulfate.  We will assess for obstructive sleep apnea and erectile dysfunction at the follow-up visit because of a problem list sent to me by his PCP (has ED and daytime somnolence on it).  Finally, will have him repeat the BMET later this week to assure improvement in his acute on chronic renal failure which is believed secondary to over diuresis (the lasix dose is being cut in half).

## 2013-04-27 ENCOUNTER — Encounter: Payer: Self-pay | Admitting: Internal Medicine

## 2013-04-27 NOTE — Progress Notes (Signed)
Spoke with Mr. Zellers this AM and discussed the lab results and implications.  He is going to be out of town for the Labor Day holiday but will be returning on September 3rd.  We therefore scheduled a lab only appointment for 10 AM on 05/07/2013 to follow-up on the acute renal failure likely secondary to over-diuresis.  He will decrease the lasix to 60 mg PO QAM in the interim.  He confirmed the diagnosis of erectile dysfunction.  The cause is unclear but is likely secondary to polypharmacy.  A recent TSH was within normal limits.  I will check a testosterone and a prolactin when he comes in for the repeat BMET.  He also confirms daytime somnolence and a desire of numerous previous physicians to perform nocturnal polysomnography to assess for obstructive sleep apnea.  If he has OSA and it is untreated, it could be causing a strain on his heart and worsen his diabetes and hypertension control.  He is will to undergo a formal sleep study at this point and I have placed the referral.

## 2013-04-27 NOTE — Addendum Note (Signed)
Addended by: Doneen Poisson D on: 04/27/2013 12:45 PM   Modules accepted: Orders

## 2013-04-30 ENCOUNTER — Ambulatory Visit (INDEPENDENT_AMBULATORY_CARE_PROVIDER_SITE_OTHER): Payer: Medicare Other

## 2013-04-30 DIAGNOSIS — Z7901 Long term (current) use of anticoagulants: Secondary | ICD-10-CM

## 2013-04-30 DIAGNOSIS — I4891 Unspecified atrial fibrillation: Secondary | ICD-10-CM

## 2013-04-30 LAB — POCT INR: INR: 2.6

## 2013-05-04 DIAGNOSIS — Z87898 Personal history of other specified conditions: Secondary | ICD-10-CM | POA: Diagnosis not present

## 2013-05-07 ENCOUNTER — Other Ambulatory Visit (INDEPENDENT_AMBULATORY_CARE_PROVIDER_SITE_OTHER): Payer: Medicare Other

## 2013-05-07 DIAGNOSIS — I4891 Unspecified atrial fibrillation: Secondary | ICD-10-CM | POA: Diagnosis not present

## 2013-05-07 DIAGNOSIS — H04129 Dry eye syndrome of unspecified lacrimal gland: Secondary | ICD-10-CM | POA: Diagnosis not present

## 2013-05-07 DIAGNOSIS — Z0000 Encounter for general adult medical examination without abnormal findings: Secondary | ICD-10-CM | POA: Diagnosis not present

## 2013-05-07 DIAGNOSIS — M109 Gout, unspecified: Secondary | ICD-10-CM | POA: Diagnosis not present

## 2013-05-07 DIAGNOSIS — I10 Essential (primary) hypertension: Secondary | ICD-10-CM | POA: Diagnosis not present

## 2013-05-07 DIAGNOSIS — R079 Chest pain, unspecified: Secondary | ICD-10-CM | POA: Diagnosis not present

## 2013-05-07 DIAGNOSIS — E119 Type 2 diabetes mellitus without complications: Secondary | ICD-10-CM | POA: Diagnosis not present

## 2013-05-07 DIAGNOSIS — N179 Acute kidney failure, unspecified: Secondary | ICD-10-CM | POA: Diagnosis not present

## 2013-05-07 DIAGNOSIS — G894 Chronic pain syndrome: Secondary | ICD-10-CM | POA: Diagnosis not present

## 2013-05-07 DIAGNOSIS — T148XXA Other injury of unspecified body region, initial encounter: Secondary | ICD-10-CM | POA: Diagnosis not present

## 2013-05-07 DIAGNOSIS — M47812 Spondylosis without myelopathy or radiculopathy, cervical region: Secondary | ICD-10-CM | POA: Diagnosis not present

## 2013-05-07 DIAGNOSIS — H4010X0 Unspecified open-angle glaucoma, stage unspecified: Secondary | ICD-10-CM | POA: Diagnosis not present

## 2013-05-07 DIAGNOSIS — J309 Allergic rhinitis, unspecified: Secondary | ICD-10-CM | POA: Diagnosis not present

## 2013-05-07 DIAGNOSIS — I2510 Atherosclerotic heart disease of native coronary artery without angina pectoris: Secondary | ICD-10-CM | POA: Diagnosis not present

## 2013-05-07 DIAGNOSIS — J45909 Unspecified asthma, uncomplicated: Secondary | ICD-10-CM | POA: Diagnosis not present

## 2013-05-07 DIAGNOSIS — R1031 Right lower quadrant pain: Secondary | ICD-10-CM | POA: Diagnosis not present

## 2013-05-07 DIAGNOSIS — N529 Male erectile dysfunction, unspecified: Secondary | ICD-10-CM

## 2013-05-07 DIAGNOSIS — I5032 Chronic diastolic (congestive) heart failure: Secondary | ICD-10-CM | POA: Diagnosis not present

## 2013-05-07 DIAGNOSIS — E785 Hyperlipidemia, unspecified: Secondary | ICD-10-CM | POA: Diagnosis not present

## 2013-05-07 DIAGNOSIS — T148 Other injury of unspecified body region: Secondary | ICD-10-CM | POA: Diagnosis not present

## 2013-05-07 DIAGNOSIS — Z7901 Long term (current) use of anticoagulants: Secondary | ICD-10-CM | POA: Diagnosis not present

## 2013-05-07 DIAGNOSIS — I25810 Atherosclerosis of coronary artery bypass graft(s) without angina pectoris: Secondary | ICD-10-CM | POA: Diagnosis not present

## 2013-05-07 DIAGNOSIS — B351 Tinea unguium: Secondary | ICD-10-CM | POA: Diagnosis not present

## 2013-05-07 DIAGNOSIS — R7309 Other abnormal glucose: Secondary | ICD-10-CM | POA: Diagnosis not present

## 2013-05-07 DIAGNOSIS — R635 Abnormal weight gain: Secondary | ICD-10-CM | POA: Diagnosis not present

## 2013-05-07 DIAGNOSIS — K219 Gastro-esophageal reflux disease without esophagitis: Secondary | ICD-10-CM | POA: Diagnosis not present

## 2013-05-07 DIAGNOSIS — R0602 Shortness of breath: Secondary | ICD-10-CM | POA: Diagnosis not present

## 2013-05-07 LAB — BASIC METABOLIC PANEL WITH GFR
Calcium: 9.3 mg/dL (ref 8.4–10.5)
Creat: 1.24 mg/dL (ref 0.50–1.35)
GFR, Est Non African American: 64 mL/min

## 2013-05-08 LAB — PROLACTIN: Prolactin: 9.3 ng/mL (ref 2.1–17.1)

## 2013-05-08 LAB — TESTOSTERONE: Testosterone: 319 ng/dL (ref 300–890)

## 2013-05-08 NOTE — Progress Notes (Signed)
Testosterone 319 Prolactin 9.3  No reversible endocrine cause for his erectile dysfunction.  Cr 1.24, eGFR 74.  Marked improvement in renal function with a decrease in his lasix dose.  I called Juan Calderon with the good news.  He states that his chronic dyspnea is unchanged despite backing off on the lasix dose and his orthostatic dizziness is unchanged.  Will continue current regimen with further adjustments at the follow-up visit as appropriate.

## 2013-05-11 ENCOUNTER — Ambulatory Visit (HOSPITAL_COMMUNITY)
Admission: RE | Admit: 2013-05-11 | Discharge: 2013-05-11 | Disposition: A | Discharge status: Home / Self Care | Payer: Medicare Other | Source: Ambulatory Visit | Attending: Internal Medicine | Admitting: Internal Medicine

## 2013-05-11 DIAGNOSIS — J45909 Unspecified asthma, uncomplicated: Secondary | ICD-10-CM | POA: Insufficient documentation

## 2013-05-11 MED ORDER — ALBUTEROL SULFATE (5 MG/ML) 0.5% IN NEBU
2.5000 mg | INHALATION_SOLUTION | Freq: Once | RESPIRATORY_TRACT | Status: AC
  Administered 2013-05-11: 2.5 mg via RESPIRATORY_TRACT

## 2013-05-23 ENCOUNTER — Encounter (HOSPITAL_COMMUNITY): Payer: Self-pay | Admitting: Emergency Medicine

## 2013-05-23 ENCOUNTER — Other Ambulatory Visit: Payer: Self-pay | Admitting: Internal Medicine

## 2013-05-23 ENCOUNTER — Emergency Department (HOSPITAL_COMMUNITY)
Admission: EM | Admit: 2013-05-23 | Discharge: 2013-05-23 | Disposition: A | Discharge status: Home / Self Care | Payer: Medicare Other | Attending: Emergency Medicine | Admitting: Emergency Medicine

## 2013-05-23 DIAGNOSIS — I10 Essential (primary) hypertension: Secondary | ICD-10-CM | POA: Diagnosis not present

## 2013-05-23 DIAGNOSIS — M109 Gout, unspecified: Secondary | ICD-10-CM | POA: Diagnosis not present

## 2013-05-23 DIAGNOSIS — Z8739 Personal history of other diseases of the musculoskeletal system and connective tissue: Secondary | ICD-10-CM | POA: Insufficient documentation

## 2013-05-23 DIAGNOSIS — I4891 Unspecified atrial fibrillation: Secondary | ICD-10-CM | POA: Insufficient documentation

## 2013-05-23 DIAGNOSIS — E785 Hyperlipidemia, unspecified: Secondary | ICD-10-CM | POA: Diagnosis not present

## 2013-05-23 DIAGNOSIS — Z79899 Other long term (current) drug therapy: Secondary | ICD-10-CM | POA: Diagnosis not present

## 2013-05-23 DIAGNOSIS — I5032 Chronic diastolic (congestive) heart failure: Secondary | ICD-10-CM | POA: Diagnosis not present

## 2013-05-23 DIAGNOSIS — E119 Type 2 diabetes mellitus without complications: Secondary | ICD-10-CM | POA: Diagnosis not present

## 2013-05-23 DIAGNOSIS — Z8669 Personal history of other diseases of the nervous system and sense organs: Secondary | ICD-10-CM | POA: Diagnosis not present

## 2013-05-23 DIAGNOSIS — K219 Gastro-esophageal reflux disease without esophagitis: Secondary | ICD-10-CM | POA: Diagnosis not present

## 2013-05-23 DIAGNOSIS — R21 Rash and other nonspecific skin eruption: Secondary | ICD-10-CM | POA: Diagnosis not present

## 2013-05-23 DIAGNOSIS — Z87448 Personal history of other diseases of urinary system: Secondary | ICD-10-CM | POA: Diagnosis not present

## 2013-05-23 DIAGNOSIS — I2510 Atherosclerotic heart disease of native coronary artery without angina pectoris: Secondary | ICD-10-CM | POA: Insufficient documentation

## 2013-05-23 MED ORDER — CETIRIZINE HCL 10 MG PO CAPS: 10.0000 mg | ORAL_CAPSULE | Freq: Every day | ORAL | Status: DC

## 2013-05-23 MED ORDER — PREDNISONE 20 MG PO TABS: ORAL_TABLET | ORAL | Status: DC

## 2013-05-23 MED ORDER — DIPHENHYDRAMINE HCL 25 MG PO TABS: 25.0000 mg | ORAL_TABLET | Freq: Four times a day (QID) | ORAL | Status: DC

## 2013-05-23 NOTE — ED Provider Notes (Signed)
CSN: [id number]     Arrival date & time 05/23/13  1412 History  This chart was scribed for non-physician practitioner Rhea Bleacher PA-C working with Roney Marion, MD by Leone Payor, ED Scribe. This patient was seen in room TR05C/TR05C and the patient's care was started at 1412.    Chief Complaint  Patient presents with  . Rash    The history is provided by the patient. No language interpreter was used.   HPI Comments: Juan Calderon is a 57 y.o. male who presents to the Emergency Department complaining of 2 days of gradual onset, gradually worsening, constant rash to the bilateral upper extremities and upper chest. Pt states it initially appeared as 3 spots on the left hand and spread from there. He denies any spots on the palms or webbing of the hands or lower body. He reports associated itching that began today. He has applied alcohol to the areas without relief. He denies fevers. Pt denies any new medication use. He has history of DM which is diet controlled. He denies kidney problems. He denies sick contacts with similar symptoms. He denies new soap or detergent use in the home.    Past Medical History  Diagnosis Date  . Essential hypertension 09/20/2011  . Atrial fibrillation 02/04/2012  . Chronic diastolic heart failure 02/04/2012    with mild left ventricular hypertrophy on Echo 02/2010  . Chronic pain syndrome 01/15/2013    Left arm and leg s/p traumatic injury   . Osteoarthritis cervical spine 04/25/2013  . Gastroesophageal reflux disease 04/25/2013  . Open-angle glaucoma 04/25/2013  . Type II diabetes mellitus 04/25/2013  . Hyperlipidemia LDL goal < 100 04/25/2013  . Coronary artery disease 04/25/2013    25% LAD stenosis on cath 2007.  Stable angina.  . Asthma, chronic 04/25/2013    Clinical diagnosis  . Gout 04/25/2013  . Right rotator cuff tear 04/25/2013    Large full-thickness tear of the supraspinatus with mild retraction but no atrophy   . Vasomotor rhinitis 04/25/2013  . Erectile  dysfunction 04/27/2013   Past Surgical History  Procedure Laterality Date  . Left leg surgery    . Left arm surgery    . Shoulder surgery      Right   Family History  Problem Relation Age of Onset  . Heart failure Mother   . Dementia Father    History  Substance Use Topics  . Smoking status: Never Smoker   . Smokeless tobacco: Never Used  . Alcohol Use: 1.0 oz/week    2 drink(s) per week    Review of Systems  Constitutional: Negative for fever.  HENT: Negative for sore throat, facial swelling, rhinorrhea and trouble swallowing.   Eyes: Negative for redness.  Respiratory: Negative for cough, shortness of breath, wheezing and stridor.   Cardiovascular: Negative for chest pain.  Gastrointestinal: Negative for nausea, vomiting, abdominal pain and diarrhea.  Genitourinary: Negative for dysuria.  Musculoskeletal: Negative for myalgias.  Skin: Positive for rash.  Neurological: Negative for light-headedness and headaches.  Psychiatric/Behavioral: Negative for confusion.    Allergies  Ramipril and Testosterone  Home Medications   Current Outpatient Rx  Name  Route  Sig  Dispense  Refill  . albuterol (PROVENTIL HFA;VENTOLIN HFA) 108 (90 BASE) MCG/ACT inhaler   Inhalation   Inhale 2 puffs into the lungs every 6 (six) hours as needed for wheezing.   2 Inhaler   3   . allopurinol (ZYLOPRIM) 300 MG tablet   Oral  Take 1 tablet (300 mg total) by mouth daily.   90 tablet   3   . artificial tears (LACRILUBE) OINT ophthalmic ointment   Ophthalmic   Apply to eye every 6 (six) hours as needed.   3.5 g   3   . atorvastatin (LIPITOR) 40 MG tablet   Oral   Take 1 tablet (40 mg total) by mouth daily.   90 tablet   3   . cloNIDine (CATAPRES) 0.2 MG tablet   Oral   Take 1 tablet (0.2 mg total) by mouth 2 (two) times daily.   180 tablet   3   . colchicine 0.6 MG tablet   Oral   Take 1 tablet (0.6 mg total) by mouth daily.   30 tablet   5   . diclofenac (VOLTAREN)  50 MG EC tablet   Oral   Take 1 tablet (50 mg total) by mouth 2 (two) times daily.   180 tablet   3   . digoxin (LANOXIN) 0.125 MG tablet   Oral   Take 1 tablet (0.125 mg total) by mouth daily.   30 tablet   6   . esomeprazole (NEXIUM) 40 MG capsule   Oral   Take 1 capsule (40 mg total) by mouth daily before breakfast.   90 capsule   3   . fenofibrate 160 MG tablet   Oral   Take 1 tablet (160 mg total) by mouth daily.   90 tablet   3   . furosemide (LASIX) 20 MG tablet   Oral   Take 1 tablet (20 mg total) by mouth daily.   90 tablet   3   . furosemide (LASIX) 40 MG tablet   Oral   Take 1 tablet (40 mg total) by mouth daily.   90 tablet   3   . latanoprost (XALATAN) 0.005 % ophthalmic solution   Both Eyes   Place 1 drop into both eyes daily.   2.5 mL   11   . loratadine (CLARITIN) 10 MG tablet   Oral   Take 1 tablet (10 mg total) by mouth daily.   90 tablet   3   . metoprolol (LOPRESSOR) 50 MG tablet   Oral   Take 1 tablet (50 mg total) by mouth 2 (two) times daily.   180 tablet   3   . misoprostol (CYTOTEC) 200 MCG tablet   Oral   Take 1 tablet (200 mcg total) by mouth daily.   90 tablet   3   . nitroGLYCERIN (NITROSTAT) 0.4 MG SL tablet   Sublingual   Place 1 tablet (0.4 mg total) under the tongue every 5 (five) minutes as needed.   75 tablet   3   . oxyCODONE-acetaminophen (PERCOCET) 10-325 MG per tablet   Oral   Take 1 tablet by mouth every 6 (six) hours as needed for pain.   120 tablet   0   . terazosin (HYTRIN) 2 MG capsule      TAKE ONE CAPSULE BY MOUTH EVERY NOGHT   30 capsule   2   . valsartan (DIOVAN) 160 MG tablet   Oral   Take 160 mg by mouth daily.         [name] warfarin (COUMADIN) 5 MG tablet      5 mg by mouth daily except for 2.5 mg by mouth on Mondays, Wednesdays, and Fridays   72 tablet   0     PT REQUEST 90 DAYS  RX WITH REFILLS. THANK YOU    There were no vitals taken for this visit. Physical Exam  Nursing  note and vitals reviewed. Constitutional: He is oriented to person, place, and time. He appears well-developed and well-nourished. No distress.  HENT:  Head: Normocephalic and atraumatic.  Eyes: Conjunctivae and EOM are normal.  Neck: Normal range of motion. Neck supple.  Cardiovascular: Normal rate.   Pulmonary/Chest: Effort normal. No stridor. No respiratory distress.  Musculoskeletal: Normal range of motion.  Neurological: He is alert and oriented to person, place, and time.  Skin: Skin is warm and dry.  Small papules scattered diffusely on the upper extremities and upper chest.   Psychiatric: He has a normal mood and affect.    ED Course  Procedures (including critical care time)  DIAGNOSTIC STUDIES: Oxygen Saturation is 96% on RA, adequate by my interpretation.    COORDINATION OF CARE: 2:29 PM Will prescribe prednisone, cetrizine and advised to take benadryl at home for relief. Discussed treatment plan with pt at bedside and pt agreed to plan.   Vital signs reviewed and are as follows: Filed Vitals:   05/23/13 1419  BP: 134/96  Pulse: 70  Temp: 97.6 F (36.4 C)  Resp: 16   Patient counseled to closely monitor his sugars while taking prednisone. He is to discontinue if blood sugar goes above 250.  Urged primary care followup to ensure resolution.  Labs Review Labs Reviewed - No data to display Imaging Review No results found.  MDM   1. Rash and nonspecific skin eruption    Patient with nonspecific rash, appears most likely to be allergic in nature. No recent tick bite. No signs of anaphylaxis. No definite exposures that are new. Patient has diet controlled DM and monitors his sugars closely. Feel trial of prednisone with close monitoring of blood sugars is warranted. Pt appears well, non-toxic.   I personally performed the services described in this documentation, which was scribed in my presence. The recorded information has been reviewed and is  accurate.   Renne Crigler, PA-C 05/24/13 1009

## 2013-05-23 NOTE — ED Notes (Addendum)
Pt presents to ED for evaluation of rash to both arms and underarms. Pt reports that the rash itches, denies any n/v/d or swelling. Pt also denies any new soap or lotions.  Pt is axo X4 and nad noted at this time.

## 2013-05-26 ENCOUNTER — Encounter: Payer: Self-pay | Admitting: Cardiovascular Disease

## 2013-05-26 ENCOUNTER — Ambulatory Visit (INDEPENDENT_AMBULATORY_CARE_PROVIDER_SITE_OTHER): Payer: Medicare Other

## 2013-05-26 ENCOUNTER — Ambulatory Visit (INDEPENDENT_AMBULATORY_CARE_PROVIDER_SITE_OTHER): Payer: Medicare Other | Admitting: Cardiovascular Disease

## 2013-05-26 VITALS — BP 110/68 | HR 55 | Wt 289.0 lb

## 2013-05-26 DIAGNOSIS — I2510 Atherosclerotic heart disease of native coronary artery without angina pectoris: Secondary | ICD-10-CM

## 2013-05-26 DIAGNOSIS — I5032 Chronic diastolic (congestive) heart failure: Secondary | ICD-10-CM

## 2013-05-26 DIAGNOSIS — I4891 Unspecified atrial fibrillation: Secondary | ICD-10-CM

## 2013-05-26 DIAGNOSIS — I10 Essential (primary) hypertension: Secondary | ICD-10-CM | POA: Diagnosis not present

## 2013-05-26 DIAGNOSIS — Z7901 Long term (current) use of anticoagulants: Secondary | ICD-10-CM

## 2013-05-26 LAB — POCT INR: INR: 3

## 2013-05-26 NOTE — Telephone Encounter (Signed)
Diet controlled diabetes with Hgb A1C of 6.5.  Testing not appropriate at this time, thus refill request rejected.

## 2013-05-26 NOTE — Progress Notes (Signed)
History of Present Illness: 57 yo male with history of diastolic CHF, paroxysmal AFib, tobacco abuse, HTN, HL, gout, GERD who is here today for cardiac follow up. I saw him as a new patient in January 2013. He was seen in the [facility] on 09/18/11 for HTN. Diovan was increased to 160 mg po Qdaily and he was referred to our office for f/u. He lives here and spends time with family in [place], [place] ([place]). He had been followed by Dr. Charm Rings At [facility] in [place], [place]. Echo 6/11: mild LVH, EF of 55%. miild LAE. Monitor April 2012 in [place], [place] showed NSR with episodes of sinus brady, rare PVCs, occasional PACs and brief episodes of atrial tachycardia. Per records he has been on Multaq in the past but did not like this medication so it was stopped. He had a stress myoview 09/14/05 that showed reversible ischemia in the inferior wall. This led to a cardiac cath on 10/01/05 which showed 25% mid LAD stenosis per report but no other evidence of CAD. Last Myoview 9/13: No ischemia. He was seen in the emergency room 08/23/12 with complaints of edema. BNP was mildly elevated. There was some suggestion of pulmonary venous congestion and possible pleural effusions on chest x-ray. At last visit March 2014 his weight was up 7 lbs and his Lasix was increased. He was seen several times in f/u by Tereso Newcomer, PA-C and not felt to be volume overloaded. TSH was normal. He was seen in primary care 04/24/13 and his renal function had worsened. Lasix was changed to 60 mg po QDaily and renal function returned to normal.   He is here today for follow up.  No chest pain. He still has severe pain in legs, arms, shoulders, back. This is chronic. No energy. Worsened lower ext edema.   Primary Care Physician:  Alicia Amel PA-C [facility] [place] [facility]  [phone]   Past Medical History  Diagnosis Date  . Essential hypertension 09/20/2011  . Atrial fibrillation  02/04/2012  . Chronic diastolic heart failure 02/04/2012    with mild left ventricular hypertrophy on Echo 02/2010  . Chronic pain syndrome 01/15/2013    Left arm and leg s/p traumatic injury   . Osteoarthritis cervical spine 04/25/2013  . Gastroesophageal reflux disease 04/25/2013  . Open-angle glaucoma 04/25/2013  . Type II diabetes mellitus 04/25/2013  . Hyperlipidemia LDL goal < 100 04/25/2013  . Coronary artery disease 04/25/2013    25% LAD stenosis on cath 2007.  Stable angina.  . Asthma, chronic 04/25/2013    Clinical diagnosis  . Gout 04/25/2013  . Right rotator cuff tear 04/25/2013    Large full-thickness tear of the supraspinatus with mild retraction but no atrophy   . Vasomotor rhinitis 04/25/2013  . Erectile dysfunction 04/27/2013    Past Surgical History  Procedure Laterality Date  . Left leg surgery    . Left arm surgery    . Shoulder surgery      Right    Current Outpatient Prescriptions  Medication Sig Dispense Refill  . albuterol (PROVENTIL HFA;VENTOLIN HFA) 108 (90 BASE) MCG/ACT inhaler Inhale 2 puffs into the lungs every 6 (six) hours as needed for wheezing.  2 Inhaler  3  . allopurinol (ZYLOPRIM) 300 MG tablet Take 1 tablet (300 mg total) by mouth daily.  90 tablet  3  . artificial tears (LACRILUBE) OINT ophthalmic ointment Apply to eye every 6 (six) hours as needed.  3.5 g  3  . atorvastatin (LIPITOR) 40 MG tablet Take 1 tablet (40 mg total) by mouth daily.  90 tablet  3  . Cetirizine HCl 10 MG CAPS Take 1 capsule (10 mg total) by mouth daily.  7 capsule  0  . cloNIDine (CATAPRES) 0.2 MG tablet Take 1 tablet (0.2 mg total) by mouth 2 (two) times daily.  180 tablet  3  . colchicine 0.6 MG tablet Take 1 tablet (0.6 mg total) by mouth daily.  30 tablet  5  . diclofenac (VOLTAREN) 50 MG EC tablet Take 1 tablet (50 mg total) by mouth 2 (two) times daily.  180 tablet  3  . digoxin (LANOXIN) 0.125 MG tablet Take 1 tablet (0.125 mg total) by mouth daily.  30 tablet  6  .  diphenhydrAMINE (BENADRYL) 25 MG tablet Take 1 tablet (25 mg total) by mouth every 6 (six) hours.  12 tablet  0  . esomeprazole (NEXIUM) 40 MG capsule Take 1 capsule (40 mg total) by mouth daily before breakfast.  90 capsule  3  . fenofibrate 160 MG tablet Take 1 tablet (160 mg total) by mouth daily.  90 tablet  3  . furosemide (LASIX) 20 MG tablet Take 1 tablet (20 mg total) by mouth daily.  90 tablet  3  . furosemide (LASIX) 40 MG tablet Take 1 tablet (40 mg total) by mouth daily.  90 tablet  3  . latanoprost (XALATAN) 0.005 % ophthalmic solution Place 1 drop into both eyes daily.  2.5 mL  11  . loratadine (CLARITIN) 10 MG tablet Take 1 tablet (10 mg total) by mouth daily.  90 tablet  3  . metoprolol (LOPRESSOR) 50 MG tablet Take 1 tablet (50 mg total) by mouth 2 (two) times daily.  180 tablet  3  . misoprostol (CYTOTEC) 200 MCG tablet Take 1 tablet (200 mcg total) by mouth daily.  90 tablet  3  . nitroGLYCERIN (NITROSTAT) 0.4 MG SL tablet Place 1 tablet (0.4 mg total) under the tongue every 5 (five) minutes as needed.  75 tablet  3  . oxyCODONE-acetaminophen (PERCOCET) 10-325 MG per tablet Take 1 tablet by mouth every 6 (six) hours as needed for pain.  120 tablet  0  . predniSONE (DELTASONE) 20 MG tablet 3 Tabs PO Days 1-3, then 2 tabs PO Days 4-6, then 1 tab PO Day 7-9, then Half Tab PO Day 10-12  20 tablet  0  . terazosin (HYTRIN) 2 MG capsule TAKE ONE CAPSULE BY MOUTH EVERY NOGHT  30 capsule  2  . valsartan (DIOVAN) 160 MG tablet Take 160 mg by mouth daily.      [name] warfarin (COUMADIN) 5 MG tablet 5 mg by mouth daily except for 2.5 mg by mouth on Mondays, Wednesdays, and Fridays  72 tablet  0   No current facility-administered medications for this visit.    Allergies  Allergen Reactions  . Ramipril Swelling  . Testosterone     History   Social History  . Marital Status: Widowed    Spouse Name: N/A    Number of Children: N/A  . Years of Education: N/A   Occupational History  .  Not on file.   Social History Main Topics  . Smoking status: Never Smoker   . Smokeless tobacco: Never Used  . Alcohol Use: 1.0 oz/week    2 drink(s) per week  . Drug Use: No  . Sexual Activity: Not on file   Other Topics Concern  .  Not on file   Social History Narrative  . No narrative on file    Family History  Problem Relation Age of Onset  . Heart failure Mother   . Dementia Father     Review of Systems:  As stated in the HPI and otherwise negative.   BP 110/68  Pulse 55  Wt 289 lb (131.09 kg)  BMI 40.33 kg/m2  Physical Examination: General: Well developed, well nourished, NAD HEENT: OP clear, mucus membranes moist SKIN: warm, dry. No rashes. Neuro: No focal deficits Musculoskeletal: Muscle strength 5/5 all ext Psychiatric: Mood and affect normal Neck: No JVD, no carotid bruits, no thyromegaly, no lymphadenopathy. Lungs:Clear bilaterally, no wheezes, rhonci, crackles Cardiovascular: Irreg irreg rate and rhythm. No murmurs, gallops or rubs. Abdomen:Soft. Bowel sounds present. Non-tender.  Extremities: No lower extremity edema. Pulses are 2 + in the bilateral DP/PT.  EKG: atrial fibrillation, rate 55 bpm. Non-specific T wave abnormalities.   Assessment and Plan:   1. Chronic Diastolic CHF: Weight is stable. Continue Lasix 60 mg po Qdaily. Follow weights daily at home.   2. Atrial Fibrillation: Rate controlled on Digoxin and Lopressor 50 mg po BID. He remains on Coumadin. He is followed in our coumadin clinic.   3. Hypertension: Controlled. Continue Diovan, Lopressor, Clonidine.   4. CAD: Stable. He is not on an ASA since he has been on coumadin. Continue statin, beta blocker. Recommend daily exercise.

## 2013-05-26 NOTE — Patient Instructions (Addendum)
Your physician wants you to follow-up in:  6 months. You will receive a reminder letter in the mail two months in advance. If you don't receive a letter, please call our office to schedule the follow-up appointment.   

## 2013-05-27 NOTE — ED Provider Notes (Signed)
Medical screening examination/treatment/procedure(s) were performed by non-physician practitioner and as supervising physician I was immediately available for consultation/collaboration.   Zaidy Absher J Syrita Dovel, MD 05/27/13 1448 

## 2013-05-28 ENCOUNTER — Other Ambulatory Visit: Payer: Self-pay | Admitting: *Deleted

## 2013-05-28 DIAGNOSIS — G894 Chronic pain syndrome: Secondary | ICD-10-CM

## 2013-05-28 MED ORDER — OXYCODONE-ACETAMINOPHEN 10-325 MG PO TABS: 1.0000 | ORAL_TABLET | Freq: Four times a day (QID) | ORAL | Status: DC | PRN

## 2013-05-28 NOTE — Telephone Encounter (Signed)
Rx ready for pick up ; pt called. 

## 2013-05-28 NOTE — Telephone Encounter (Signed)
Pt called and informed blood sugar testing is no longer needed d/t excellent control per Dr Josem Kaufmann; he voiced understanding.

## 2013-05-28 NOTE — Telephone Encounter (Signed)
Please call patient to inform him blood sugar testing is not required given his current excellent control.  Lancets or strips will not be supplied.  Thanks.

## 2013-05-28 NOTE — Telephone Encounter (Signed)
Pt is requesting rx for lancets.  Thanks

## 2013-05-31 ENCOUNTER — Other Ambulatory Visit: Payer: Self-pay | Admitting: Cardiovascular Disease

## 2013-06-01 ENCOUNTER — Ambulatory Visit (HOSPITAL_BASED_OUTPATIENT_CLINIC_OR_DEPARTMENT_OTHER): Payer: Medicare Other | Attending: Internal Medicine | Admitting: Radiology

## 2013-06-01 VITALS — Ht 71.0 in | Wt 288.0 lb

## 2013-06-01 DIAGNOSIS — R0609 Other forms of dyspnea: Secondary | ICD-10-CM | POA: Insufficient documentation

## 2013-06-01 DIAGNOSIS — G4733 Obstructive sleep apnea (adult) (pediatric): Secondary | ICD-10-CM | POA: Diagnosis not present

## 2013-06-01 DIAGNOSIS — R0989 Other specified symptoms and signs involving the circulatory and respiratory systems: Secondary | ICD-10-CM | POA: Insufficient documentation

## 2013-06-03 ENCOUNTER — Ambulatory Visit (INDEPENDENT_AMBULATORY_CARE_PROVIDER_SITE_OTHER): Payer: Medicare Other | Admitting: *Deleted

## 2013-06-03 DIAGNOSIS — I4891 Unspecified atrial fibrillation: Secondary | ICD-10-CM | POA: Diagnosis not present

## 2013-06-03 DIAGNOSIS — Z7901 Long term (current) use of anticoagulants: Secondary | ICD-10-CM | POA: Diagnosis not present

## 2013-06-03 DIAGNOSIS — Z87898 Personal history of other specified conditions: Secondary | ICD-10-CM | POA: Diagnosis not present

## 2013-06-03 LAB — POCT INR: INR: 2.4

## 2013-06-04 ENCOUNTER — Other Ambulatory Visit: Payer: Self-pay | Admitting: Internal Medicine

## 2013-06-04 DIAGNOSIS — I10 Essential (primary) hypertension: Secondary | ICD-10-CM

## 2013-06-06 DIAGNOSIS — G4733 Obstructive sleep apnea (adult) (pediatric): Secondary | ICD-10-CM | POA: Diagnosis not present

## 2013-06-06 NOTE — Procedures (Signed)
Juan Calderon, Juan Calderon                ACCOUNT NO.:  [id number]  MEDICAL RECORD NO.:  [id number]          PATIENT TYPE:  OUT  LOCATION:  SLEEP CENTER                 FACILITY:  [facility]  PHYSICIAN:  Clinton D. Maple Hudson, MD, FCCP, FACPDATE OF BIRTH:  07/24/1954  DATE OF STUDY:  06/01/2013                           NOCTURNAL POLYSOMNOGRAM  REFERRING PHYSICIAN:  Doneen Poisson, MD  INDICATION FOR STUDY:  Hypersomnia with sleep apnea.  EPWORTH SLEEPINESS SCORE:  4/24.  BMI 40.2, weight 288 pounds, height 71 inches, neck 17.5 inches.  MEDICATIONS:  Home medications are charted for review.  SLEEP ARCHITECTURE:  Split study protocol.  During the diagnostic phase, total sleep time 131 minutes with sleep efficiency 71.6%.  Stage I was 15.6%, stage II 65.6%, stage III absent.  REM 18.7% of total sleep time. Sleep latency 36 minutes.  REM latency 115 minutes.  Awake after sleep onset 11 minutes.  Arousal index 16.9.  Bedtime medication:  Clonidine, Voltaren, metoprolol, terazosin.  RESPIRATORY DATA:  Split study protocol.  Apnea/hypopnea index (AHI) 29.8 per hour.  A total of 65 events was scored including 6 obstructive apneas and 59 hypopneas.  Events were associated with nonsupine sleep position.  REM AHI 58.8.  CPAP was titrated to 16 CWP with a residual AHI of 12 per hour.  The technician described difficulty with titration because the patient woke before titration was completed.  He wore a medium Fisher and Paykel fullface mask, Simplus with heated humidifier and an EPR of 2.  OXYGEN DATA:  Moderately loud snoring before CPAP with oxygen desaturation to a nadir of 79% on room air.  At final CPAP pressure, minimal residual snoring was still noted when the patient was lying supine.  Mean oxygen saturation on CPAP was 96.2% on room air.  CARDIAC DATA:  Atrial fibrillation with occasional PVC or aberrant conduction.  MOVEMENT-PARASOMNIA:  No significant movement disturbance.  Bathroom  x1.  IMPRESSIONS-RECOMMENDATIONS: 1. Moderate obstructive sleep apnea/hypopnea syndrome, AHI 29.8 per     hour with moderately loud snoring and oxygen desaturation to a     nadir of 79%. 2. CPAP was titrated to 16 CWP.  Time ran out and the patient awoke     before the technician felt titration was fully completed.  At a     CPAP pressure of 16, there was residual mild snoring at an AHI of     12 reflecting a few     residual hypopneas in the recording time.  Suggest trying an     initial home pressure of 17 CWP.  He wore a medium Fisher and     Paykel Simplus fullface mask with heated humidifier and an EPR of 2     for this study.     Clinton D. Maple Hudson, MD, Tonny Bollman, FACP Diplomate, American Board of Sleep Medicine    CDY/MEDQ  D:  06/06/2013 08:45:34  T:  06/06/2013 09:05:50  Job:  [id number]

## 2013-06-15 ENCOUNTER — Encounter: Payer: Self-pay | Admitting: Internal Medicine

## 2013-06-15 ENCOUNTER — Other Ambulatory Visit: Payer: Self-pay | Admitting: Internal Medicine

## 2013-06-15 DIAGNOSIS — G4733 Obstructive sleep apnea (adult) (pediatric): Secondary | ICD-10-CM

## 2013-06-19 ENCOUNTER — Ambulatory Visit (INDEPENDENT_AMBULATORY_CARE_PROVIDER_SITE_OTHER): Payer: Medicare Other | Admitting: Internal Medicine

## 2013-06-19 ENCOUNTER — Encounter: Payer: Self-pay | Admitting: Internal Medicine

## 2013-06-19 VITALS — BP 125/84 | HR 77 | Temp 97.2°F | Ht 71.0 in | Wt 294.7 lb

## 2013-06-19 DIAGNOSIS — I2510 Atherosclerotic heart disease of native coronary artery without angina pectoris: Secondary | ICD-10-CM

## 2013-06-19 DIAGNOSIS — G4733 Obstructive sleep apnea (adult) (pediatric): Secondary | ICD-10-CM | POA: Diagnosis not present

## 2013-06-19 DIAGNOSIS — M47812 Spondylosis without myelopathy or radiculopathy, cervical region: Secondary | ICD-10-CM

## 2013-06-19 DIAGNOSIS — I10 Essential (primary) hypertension: Secondary | ICD-10-CM | POA: Diagnosis not present

## 2013-06-19 DIAGNOSIS — E785 Hyperlipidemia, unspecified: Secondary | ICD-10-CM | POA: Diagnosis not present

## 2013-06-19 DIAGNOSIS — J300 Vasomotor rhinitis: Secondary | ICD-10-CM

## 2013-06-19 DIAGNOSIS — M109 Gout, unspecified: Secondary | ICD-10-CM | POA: Diagnosis not present

## 2013-06-19 DIAGNOSIS — G894 Chronic pain syndrome: Secondary | ICD-10-CM

## 2013-06-19 DIAGNOSIS — K219 Gastro-esophageal reflux disease without esophagitis: Secondary | ICD-10-CM

## 2013-06-19 DIAGNOSIS — J45909 Unspecified asthma, uncomplicated: Secondary | ICD-10-CM

## 2013-06-19 DIAGNOSIS — M1712 Unilateral primary osteoarthritis, left knee: Secondary | ICD-10-CM

## 2013-06-19 DIAGNOSIS — H4010X0 Unspecified open-angle glaucoma, stage unspecified: Secondary | ICD-10-CM | POA: Diagnosis not present

## 2013-06-19 DIAGNOSIS — M1732 Unilateral post-traumatic osteoarthritis, left knee: Secondary | ICD-10-CM | POA: Insufficient documentation

## 2013-06-19 DIAGNOSIS — N529 Male erectile dysfunction, unspecified: Secondary | ICD-10-CM

## 2013-06-19 DIAGNOSIS — H409 Unspecified glaucoma: Secondary | ICD-10-CM | POA: Diagnosis not present

## 2013-06-19 DIAGNOSIS — J309 Allergic rhinitis, unspecified: Secondary | ICD-10-CM | POA: Diagnosis not present

## 2013-06-19 DIAGNOSIS — E1159 Type 2 diabetes mellitus with other circulatory complications: Secondary | ICD-10-CM

## 2013-06-19 DIAGNOSIS — E119 Type 2 diabetes mellitus without complications: Secondary | ICD-10-CM | POA: Diagnosis not present

## 2013-06-19 DIAGNOSIS — I5032 Chronic diastolic (congestive) heart failure: Secondary | ICD-10-CM

## 2013-06-19 DIAGNOSIS — I4891 Unspecified atrial fibrillation: Secondary | ICD-10-CM | POA: Diagnosis not present

## 2013-06-19 DIAGNOSIS — Z23 Encounter for immunization: Secondary | ICD-10-CM

## 2013-06-19 DIAGNOSIS — Z125 Encounter for screening for malignant neoplasm of prostate: Secondary | ICD-10-CM | POA: Diagnosis not present

## 2013-06-19 HISTORY — DX: Unilateral primary osteoarthritis, left knee: M17.12

## 2013-06-19 LAB — GLUCOSE, CAPILLARY: Glucose-Capillary: 176 mg/dL — ABNORMAL HIGH (ref 70–99)

## 2013-06-19 MED ORDER — DIGOXIN 125 MCG PO TABS: 0.1250 mg | ORAL_TABLET | Freq: Every day | ORAL | Status: DC

## 2013-06-19 MED ORDER — VALSARTAN 80 MG PO TABS: 80.0000 mg | ORAL_TABLET | Freq: Every day | ORAL | Status: DC

## 2013-06-19 MED ORDER — METOPROLOL TARTRATE 50 MG PO TABS: 50.0000 mg | ORAL_TABLET | Freq: Two times a day (BID) | ORAL | Status: DC

## 2013-06-19 MED ORDER — TADALAFIL 10 MG PO TABS
10.0000 mg | ORAL_TABLET | ORAL | Status: DC | PRN
Start: 2013-06-19 — End: 2013-08-07

## 2013-06-19 MED ORDER — ATORVASTATIN CALCIUM 40 MG PO TABS: 40.0000 mg | ORAL_TABLET | Freq: Every day | ORAL | Status: DC

## 2013-06-19 MED ORDER — TERAZOSIN HCL 5 MG PO CAPS: 5.0000 mg | ORAL_CAPSULE | Freq: Every day | ORAL | Status: DC

## 2013-06-19 MED ORDER — WARFARIN SODIUM 5 MG PO TABS: ORAL_TABLET | ORAL | Status: DC

## 2013-06-19 MED ORDER — COLCHICINE 0.6 MG PO TABS: 0.6000 mg | ORAL_TABLET | Freq: Every day | ORAL | Status: DC

## 2013-06-19 MED ORDER — NITROGLYCERIN 0.4 MG SL SUBL: 0.4000 mg | SUBLINGUAL_TABLET | SUBLINGUAL | Status: DC | PRN

## 2013-06-19 MED ORDER — CLONIDINE HCL 0.2 MG PO TABS: 0.2000 mg | ORAL_TABLET | Freq: Every day | ORAL | Status: DC

## 2013-06-19 NOTE — Patient Instructions (Signed)
It was great to see you again.  I appreciate your patience with me as I get to know you better.  1) Decrease your clonidine to once a day and increase your terazosin to 5 mg by mouth daily.  2) Keep taking you other medications as you are.  3) We will try to get your endoscopy records from Dr. Hinda Lenis in [place], [place].  4) We will get your Pulmonary function test report and review it at the next visit.  5) We prescribed Cialis today at 10 mg by mouth as needed.  As we discussed do not use your Nitroglycerine within 24 hours of taking the Cialis as it may result in a very low blood pressure.  6) We gave you the flu and the tetanus shot today.  7) Call the company to set up your CPAP machine for your sleep apnea like we discussed.  8) I will call you with the results of your blood work if there is any concerns.  9) At the next visit I will have you sign a long term pain contract for the narcotics.  I will see you back in 4-6 weeks to re-evaluate your blood pressure and heart failure with the changes we made in your medications.

## 2013-06-20 LAB — PSA, MEDICARE: PSA: 0.33 ng/mL (ref <=4.00)

## 2013-06-21 ENCOUNTER — Encounter: Payer: Self-pay | Admitting: Internal Medicine

## 2013-06-21 NOTE — Assessment & Plan Note (Signed)
He has not had any gout flares on the allopurinol 300 mg by mouth daily and colchicine 0.6 mg daily. His uric acid was 5.3 on the current dose of the allopurinol. This is at target. We will therefore be able to stop the colchicine in 6 months and manage his gout with only the allopurinol at 300 mg daily.

## 2013-06-21 NOTE — Assessment & Plan Note (Signed)
His hormonal workup for a cause of his erectile dysfunction was unremarkable. I suspect his erectile dysfunction is secondary to his diabetes, chronic hypertension, and polypharmacy. His diabetes and hypertension are currently well controlled and we are making efforts to decrease his polypharmacy. He was interested in giving Cialis 10 mg by mouth as needed a trial. A written prescription for 5 tablets was provided and he will assess the efficacy of that dose.

## 2013-06-21 NOTE — Progress Notes (Signed)
  Subjective:    Patient ID: Juan Calderon, male    DOB: May 06, 1956, 57 y.o.   MRN: [id number]  HPI  Please see the A&P for the status of the pt's chronic medical problems.  Review of Systems  Constitutional: Negative for fever, activity change, appetite change and unexpected weight change.  HENT: Negative for congestion, postnasal drip, rhinorrhea, sinus pressure, sneezing and sore throat.   Respiratory: Positive for shortness of breath. Negative for cough, chest tightness and wheezing.   Cardiovascular: Positive for leg swelling. Negative for chest pain and palpitations.       Left lower extremity edema (chronic)  Gastrointestinal: Negative for nausea, vomiting, abdominal pain, diarrhea, constipation and abdominal distention.  Musculoskeletal: Positive for arthralgias and myalgias. Negative for back pain and joint swelling.  Skin: Positive for wound. Negative for color change and rash.  Allergic/Immunologic: Positive for environmental allergies.  Neurological: Negative for dizziness, seizures, syncope, weakness and light-headedness.      Objective:   Physical Exam  Nursing note and vitals reviewed. Constitutional: He is oriented to person, place, and time. He appears well-developed and well-nourished. No distress.  HENT:  Head: Normocephalic and atraumatic.  Eyes: Conjunctivae are normal. Right eye exhibits no discharge. Left eye exhibits no discharge. No scleral icterus.  Cardiovascular: Normal rate.  Exam reveals gallop. Exam reveals no friction rub.   No murmur heard. Irregularly irregular, +S3 gallop  Pulmonary/Chest: Effort normal and breath sounds normal. No respiratory distress. He has no wheezes. He has no rales. He exhibits no tenderness.  Abdominal: Soft. Bowel sounds are normal. He exhibits no distension. There is no tenderness. There is no rebound and no guarding.  Genitourinary: Prostate normal.  High riding prostate  Musculoskeletal: Normal range of motion. He  exhibits edema. He exhibits no tenderness.  LUE wounds (chronic), LLE wounds (chronic) with chronic LLE edema  Neurological: He is alert and oriented to person, place, and time. He exhibits normal muscle tone.  Skin: Skin is warm and dry. No rash noted. He is not diaphoretic. No erythema. No pallor.  Psychiatric: He has a normal mood and affect. His behavior is normal. Judgment and thought content normal.      Assessment & Plan:   Please see problem oriented charting.

## 2013-06-21 NOTE — Assessment & Plan Note (Signed)
Surprisingly his vasomotor rhinitis responded well to the loratadine which makes me suspect the diagnosis may be an allergic rhinitis. We will continue the as needed loratadine at 10 mg by mouth daily as needed.

## 2013-06-21 NOTE — Assessment & Plan Note (Signed)
His hemoglobin A1c in August was 6.5 on no medications. We will recheck his hemoglobin A1c at the next visit to determine if he remains diet controlled. Our goal is to keep the hemoglobin A1c less than 7.0.

## 2013-06-21 NOTE — Assessment & Plan Note (Signed)
His posttraumatic chronic pain syndrome is reasonably well controlled on the Percocet 10-325 mg by mouth every 6 hours as needed. We will continue with this regimen and have him sign a formal pain contract at the next visit.

## 2013-06-21 NOTE — Assessment & Plan Note (Signed)
He was recently contacted by [facility] to set up his nocturnal CPAP. He asked that he speak with me first to get more information. We spent several minutes discussing the need for his therapy and how his health may benefit. He was willing to give it a try after this discussion and he will contact the home care company to have this set up arranged. We will assess his tolerance to the therapy at the followup visit.

## 2013-06-21 NOTE — Assessment & Plan Note (Signed)
We had tried to better characterize his dyspnea and possible chronic lung disease by getting a pulmonary function test. This was apparently done but the results are not available for our review within the electronic medical record. I will try to obtain his pulmonary function test results as he was interested in having me review them with him. This review will take place at the followup visit. In the meantime, we will continue the albuterol inhaler 2 puffs every 6 hours as needed for shortness of breath.

## 2013-06-21 NOTE — Assessment & Plan Note (Signed)
Since the Lasix was changed to once a day he denies any more dizziness. His weight is up 4 pounds since we may the change and he notes the chronic left lower extremity edema but denies any increased shortness of breath, orthopnea, or paroxysmal nocturnal dyspnea. If the pulse will tolerate it we will consider increasing the metoprolol to 100 mg by mouth twice a day at the next visit. We will also consider increasing the valsartan to 160 mg a day. It should be noted that there is room to adjust his other antihypertensive medications to allow [place] to push these specific heart failure medications if needed. In the meantime we will continue the frozen mind 60 mg by mouth each morning, metoprolol 50 mg by mouth twice a day, and valsartan 80 mg daily. Once we have maximized the beta blocker and ACE inhibitor and decreased the afterload without causing dizziness we will consider stopping the digoxin as this is a diastolic heart failure.

## 2013-06-21 NOTE — Assessment & Plan Note (Signed)
His is being followed closely by Dr. Dione Booze. We will continue Dr. Laruth Bouchard management plan with the latanoprost ophthalmologic solution 1 drop daily and the as needed artificial tears.

## 2013-06-21 NOTE — Assessment & Plan Note (Signed)
He has had no chest pain on the metoprolol 50 mg by mouth twice a day. He is also tolerating the atorvastatin 40 mg by mouth daily well. Finally, he has had no difficulty with the valsartan at 80 mg by mouth daily. He is not on an aspirin a day because of his history of ulcer and the fact that he requires lifelong anticoagulation with Coumadin for his atrial fibrillation. We will continue the metoprolol, atorvastatin, and valsartan.

## 2013-06-21 NOTE — Assessment & Plan Note (Signed)
His hypertension is well controlled with a blood pressure of 125/84. This allows [place] to decrease the clonidine to 0.2 mg daily. At the next visit we will taper it completely off. This should give [place] room to increase the valsartan for his underlying heart failure. If we can stop the digoxin for his rate control we should also be able to increase the metoprolol 200 mg by mouth twice a day. This may allow [place] to also wean off the tear Zosyn which was increased to 5 mg daily at this time as we wean off the clonidine. Ultimately, my goal is to treat his hypertension with only metoprolol and valsartan if possible.

## 2013-06-21 NOTE — Assessment & Plan Note (Addendum)
He received his flu shot and tetanus/diphtheria/pertussis shots today. At the followup visit we will offer him a Pneumovax given his recent diabetes diagnosis. He states he's had a colonoscopy in the past by Dr. Hinda Lenis in [place], [place]. We will try to obtain both his colonoscopy and esophagogastroduodenoscopy reports to guide [place] on the timing of his screening or surveillance colonoscopy needs. With recently diagnosed prostate cancer in his half brother we performed a rectal examination which was unremarkable and checked a PSA today and it was normal at 0.33.

## 2013-06-21 NOTE — Assessment & Plan Note (Signed)
His rate is well controlled on the metoprolol 50 mg by mouth twice a day and dig oxygen 0.125 mg daily. We may be able to push the metoprolol dose 200 mg twice a day at the next visit if his pulse will tolerate it. At this time we may consider stopping the digoxin if we are able to control his pulse with the metoprolol alone. He continues with the Coumadin and following in the [place] anticoagulation clinic.

## 2013-06-21 NOTE — Assessment & Plan Note (Signed)
His gastroesophageal reflux disease is well controlled on the Nexium 40 mg by mouth daily. He did not tolerate the 20 mg by mouth daily dose. We will therefore continue the Nexium at 40 mg by mouth daily.

## 2013-06-21 NOTE — Assessment & Plan Note (Signed)
His LDL was at target when last checked in August. We will therefore continue the atorvastatin 40 mg by mouth daily.

## 2013-06-21 NOTE — Assessment & Plan Note (Signed)
His symptoms are reasonably well controlled on the diclofenac 50 mg by mouth twice daily. We will continue this regimen in addition to the misoprostol and Nexium. He has a history of a remote ulcer and I will try to learn more about it at the followup visit. It may be possible to protect him from NSAID associated gastritis with the high-dose Nexium that he requires for his gastroesophageal reflux disease. This may allow [place] to discontinue the misoprostol.

## 2013-06-29 ENCOUNTER — Ambulatory Visit: Payer: Self-pay | Admitting: Podiatry

## 2013-07-04 DIAGNOSIS — Z87898 Personal history of other specified conditions: Secondary | ICD-10-CM | POA: Diagnosis not present

## 2013-07-10 ENCOUNTER — Ambulatory Visit (INDEPENDENT_AMBULATORY_CARE_PROVIDER_SITE_OTHER): Payer: Medicare Other | Admitting: *Deleted

## 2013-07-10 DIAGNOSIS — Z7901 Long term (current) use of anticoagulants: Secondary | ICD-10-CM | POA: Diagnosis not present

## 2013-07-10 DIAGNOSIS — I4891 Unspecified atrial fibrillation: Secondary | ICD-10-CM

## 2013-07-10 LAB — POCT INR: INR: 2.8

## 2013-07-28 ENCOUNTER — Other Ambulatory Visit: Payer: Self-pay | Admitting: *Deleted

## 2013-07-28 DIAGNOSIS — G894 Chronic pain syndrome: Secondary | ICD-10-CM

## 2013-07-28 MED ORDER — OXYCODONE-ACETAMINOPHEN 10-325 MG PO TABS: 1.0000 | ORAL_TABLET | Freq: Four times a day (QID) | ORAL | Status: DC | PRN

## 2013-07-28 NOTE — Telephone Encounter (Signed)
Prescription printed and signed for oxycodone-acetaminophen 10-325 mg, # 120, take 1 tablet every 6 hours as needed for pain, and given to refill nurse.

## 2013-07-28 NOTE — Telephone Encounter (Signed)
Pt.notified

## 2013-08-03 DIAGNOSIS — Z87898 Personal history of other specified conditions: Secondary | ICD-10-CM | POA: Diagnosis not present

## 2013-08-06 DIAGNOSIS — H4011X0 Primary open-angle glaucoma, stage unspecified: Secondary | ICD-10-CM | POA: Diagnosis not present

## 2013-08-06 DIAGNOSIS — H10429 Simple chronic conjunctivitis, unspecified eye: Secondary | ICD-10-CM | POA: Diagnosis not present

## 2013-08-06 DIAGNOSIS — H05249 Constant exophthalmos, unspecified eye: Secondary | ICD-10-CM | POA: Diagnosis not present

## 2013-08-06 DIAGNOSIS — E119 Type 2 diabetes mellitus without complications: Secondary | ICD-10-CM | POA: Diagnosis not present

## 2013-08-06 DIAGNOSIS — H2510 Age-related nuclear cataract, unspecified eye: Secondary | ICD-10-CM | POA: Diagnosis not present

## 2013-08-06 DIAGNOSIS — H40019 Open angle with borderline findings, low risk, unspecified eye: Secondary | ICD-10-CM | POA: Diagnosis not present

## 2013-08-07 ENCOUNTER — Encounter (INDEPENDENT_AMBULATORY_CARE_PROVIDER_SITE_OTHER): Payer: Self-pay

## 2013-08-07 ENCOUNTER — Other Ambulatory Visit (HOSPITAL_COMMUNITY): Payer: Self-pay

## 2013-08-07 ENCOUNTER — Encounter: Payer: Self-pay | Admitting: Internal Medicine

## 2013-08-07 ENCOUNTER — Ambulatory Visit (INDEPENDENT_AMBULATORY_CARE_PROVIDER_SITE_OTHER): Payer: Medicare Other | Admitting: Internal Medicine

## 2013-08-07 VITALS — BP 114/79 | HR 62 | Temp 97.2°F | Wt 295.6 lb

## 2013-08-07 DIAGNOSIS — E1159 Type 2 diabetes mellitus with other circulatory complications: Secondary | ICD-10-CM | POA: Diagnosis not present

## 2013-08-07 DIAGNOSIS — N521 Erectile dysfunction due to diseases classified elsewhere: Secondary | ICD-10-CM

## 2013-08-07 DIAGNOSIS — H4010X0 Unspecified open-angle glaucoma, stage unspecified: Secondary | ICD-10-CM | POA: Diagnosis not present

## 2013-08-07 DIAGNOSIS — J45909 Unspecified asthma, uncomplicated: Secondary | ICD-10-CM

## 2013-08-07 DIAGNOSIS — H409 Unspecified glaucoma: Secondary | ICD-10-CM | POA: Diagnosis not present

## 2013-08-07 DIAGNOSIS — E1142 Type 2 diabetes mellitus with diabetic polyneuropathy: Secondary | ICD-10-CM | POA: Diagnosis not present

## 2013-08-07 DIAGNOSIS — N529 Male erectile dysfunction, unspecified: Secondary | ICD-10-CM | POA: Diagnosis not present

## 2013-08-07 DIAGNOSIS — G4733 Obstructive sleep apnea (adult) (pediatric): Secondary | ICD-10-CM

## 2013-08-07 DIAGNOSIS — I4891 Unspecified atrial fibrillation: Secondary | ICD-10-CM | POA: Diagnosis not present

## 2013-08-07 DIAGNOSIS — J309 Allergic rhinitis, unspecified: Secondary | ICD-10-CM | POA: Diagnosis not present

## 2013-08-07 DIAGNOSIS — I10 Essential (primary) hypertension: Secondary | ICD-10-CM | POA: Diagnosis not present

## 2013-08-07 DIAGNOSIS — E785 Hyperlipidemia, unspecified: Secondary | ICD-10-CM | POA: Diagnosis not present

## 2013-08-07 DIAGNOSIS — E1149 Type 2 diabetes mellitus with other diabetic neurological complication: Secondary | ICD-10-CM | POA: Diagnosis not present

## 2013-08-07 DIAGNOSIS — Z125 Encounter for screening for malignant neoplasm of prostate: Secondary | ICD-10-CM | POA: Diagnosis not present

## 2013-08-07 DIAGNOSIS — G894 Chronic pain syndrome: Secondary | ICD-10-CM | POA: Diagnosis not present

## 2013-08-07 DIAGNOSIS — I5032 Chronic diastolic (congestive) heart failure: Secondary | ICD-10-CM | POA: Diagnosis not present

## 2013-08-07 DIAGNOSIS — Z23 Encounter for immunization: Secondary | ICD-10-CM

## 2013-08-07 DIAGNOSIS — K219 Gastro-esophageal reflux disease without esophagitis: Secondary | ICD-10-CM | POA: Diagnosis not present

## 2013-08-07 DIAGNOSIS — M109 Gout, unspecified: Secondary | ICD-10-CM

## 2013-08-07 DIAGNOSIS — M47812 Spondylosis without myelopathy or radiculopathy, cervical region: Secondary | ICD-10-CM | POA: Diagnosis not present

## 2013-08-07 DIAGNOSIS — E1140 Type 2 diabetes mellitus with diabetic neuropathy, unspecified: Secondary | ICD-10-CM

## 2013-08-07 DIAGNOSIS — E119 Type 2 diabetes mellitus without complications: Secondary | ICD-10-CM | POA: Diagnosis not present

## 2013-08-07 DIAGNOSIS — I2510 Atherosclerotic heart disease of native coronary artery without angina pectoris: Secondary | ICD-10-CM | POA: Diagnosis not present

## 2013-08-07 LAB — POCT GLYCOSYLATED HEMOGLOBIN (HGB A1C): Hemoglobin A1C: 6.6

## 2013-08-07 LAB — PULMONARY FUNCTION TEST
DL/VA % pred: 105 %
DL/VA: 4.91 ml/min/mmHg/L
DLCO unc: 19 ml/min/mmHg
FEF 25-75 Post: 1.28 L/sec
FEF2575-%Change-Post: 20 %
FEF2575-%Pred-Post: 43 %
FEF2575-%Pred-Pre: 35 %
FEV1-%Change-Post: 5 %
FEV1-%Pred-Post: 58 %
FEV1-%Pred-Pre: 55 %
FEV1-Post: 1.85 L
FEV1-Pre: 1.76 L
FEV1FVC-%Change-Post: 8 %
FEV1FVC-%Pred-Pre: 88 %
FEV6-%Change-Post: -2 %
FEV6-%Pred-Pre: 65 %
FEV6-Post: 2.49 L
FEV6FVC-%Pred-Pre: 102 %
FVC-%Change-Post: -2 %
FVC-%Pred-Pre: 63 %
FVC-Pre: 2.55 L
Post FEV1/FVC ratio: 75 %
Pre FEV1/FVC ratio: 69 %
Pre FEV6/FVC Ratio: 100 %
RV % pred: 115 %
RV: 2.56 L
TLC: 4.83 L

## 2013-08-07 MED ORDER — TADALAFIL 5 MG PO TABS: 5.0000 mg | ORAL_TABLET | Freq: Every day | ORAL | Status: DC | PRN

## 2013-08-07 NOTE — Patient Instructions (Signed)
It was great to see you again.  I am sorry you did not get any symptomatic relief from the CPAP.  1) You can stop using the CPAP and return the machine.  We can readdress the sleep apnea in the future once we have other issues under control.  2) We will make sure you get the lung function test that I ordered.  That will help me decide if there is anything else we need to do regarding your breathing.  3) We are still trying to get the endoscopy information from your GI doctor for our records.  Once we have this we will be better able to advise you further.  4) Decrease the clonidine to 1 tablet (0.2 mg) every other day for 1 week and then stop.  5) In 1 week increase your metoprolol to 100 mg by mouth twice a day.  Take two 50 mg tablets twice a day until you run out and thern start the 100 mg tablets twice a day where you only need to take 1 tablet twice a day again.  6) In 1 week stop the digoxin.  7) We gave you the pneumovax today.  8) I will see you back in 2 months, sooner if necessary.

## 2013-08-08 ENCOUNTER — Encounter: Payer: Self-pay | Admitting: Internal Medicine

## 2013-08-08 MED ORDER — METOPROLOL TARTRATE 100 MG PO TABS: 100.0000 mg | ORAL_TABLET | Freq: Two times a day (BID) | ORAL | Status: DC

## 2013-08-08 NOTE — Assessment & Plan Note (Signed)
His diabetes is currently diet controlled with a Hgb A1C today of 6.6.  He is not monitoring his blood sugars at home and this is not necessary at this time.  We will check a Hgb A1C in three months to assure he remains at target on diet alone.  He is up to date on all of the necessary preventative measures associated with diabetes.  With regards to his erectile dysfunction, he was unable to afford the cialis.  We therefore printed a voucher to allow him to obtain three 5 mg tablets as a free trial.  He was instructed to try the 5 mg dose first.  If ineffective, the next time he was asked to take 10 mg to see if this dose would be effective.

## 2013-08-08 NOTE — Assessment & Plan Note (Signed)
He continues to have dyspnea on exertion and occasional wheezing.  I am concerned that this is more than his chronic diastolic heart failure and could be a reflection of underlying pulmonary disease.  I ordered full PFT's, but for some reason only spirometry was done.  The FEV1/FVC ratio was not decreased and his bronchodilator response was only 10%, but there was some hint to a possible restrictive ventilatory defect which could have impacted upon this ratio.  Lung volumes would help to clarify, especially if he was found to have a mild restrictive ventilatory defect with a markedly decreased ERV to suggest there may be some underlying restriction from his morbid obesity.  The PFT's as previously ordered will be obtained next week w/o charge to [facility] or the patient.  This should provide the necessary information to make some therapeutic decisions such as the need for controller medications for possible asthma, assuming other pulmonary issues are ruled out by the full PFT's.  In the interim, we will continue the current PRN albuterol.  I apologized for the continued delay in the appropriate evaluation of this complaint resulting from the tests not being completed as requested.

## 2013-08-08 NOTE — Assessment & Plan Note (Signed)
Blood pressure is well within target on the current regimen.  We will decrease the clonidine to 0.2 mg every other day for 1 week and then discontinue.  At that time, we will increase the metoprolol to 100 mg twice daily.  We will continue the valsartan at 80 mg daily and terazosin at 5 mg daily.  At the follow-up visit, if he is doing well and the blood pressure is within range, we will discontinue the terazosin and increase the valsartan to 160 mg daily.  The goal is to manage both his hypertension and heart failure with metoprolol and valsartan alone, if possible.

## 2013-08-08 NOTE — Assessment & Plan Note (Signed)
His LDL 3 months ago was well within target at 25 on the atorvastatin 40 mg daily.  We will continue this regimen for now but if the LDL remains below 40 at upon further testing we will lower the atorvastatin to 20 mg daily.

## 2013-08-08 NOTE — Assessment & Plan Note (Signed)
His rate is well controlled on the current regimen of metoprolol 50 mg twice daily and digoxin.  As we are going to escalate the metoprolol dose to 100 mg twice daily when we taper off the clonidine next week this will allow [place] to discontinue the digoxin at that time.  We will reassess rate control on the metoprolol 100 mg twice daily alone at the follow-up visit.  This is in an attempt to consolidate his regimen to decrease costs, improve ease, and lower risks of medication toxicity.

## 2013-08-08 NOTE — Assessment & Plan Note (Signed)
His pain is well controlled on the diclofenac 50 mg twice daily and as needed percocet 10-325 1 tablet every 6 hours (disp #120).  We will continue this regimen.  A pain contract was signed at this visit.

## 2013-08-08 NOTE — Progress Notes (Signed)
   Subjective:    Patient ID: Juan Calderon, male    DOB: 1956-04-22, 57 y.o.   MRN: [id number]  HPI  Please see the A&P for the status of the pt's chronic medical problems.  Review of Systems  Constitutional: Negative for activity change.  Respiratory: Positive for shortness of breath and wheezing.   Cardiovascular: Negative for chest pain, palpitations and leg swelling.  Gastrointestinal: Negative for abdominal pain.  Genitourinary: Negative for difficulty urinating.  Musculoskeletal: Positive for arthralgias and myalgias. Negative for joint swelling.  Neurological: Negative for dizziness and syncope.      Objective:   Physical Exam  Nursing note and vitals reviewed. Constitutional: He is oriented to person, place, and time. He appears well-developed and well-nourished. No distress.  HENT:  Head: Normocephalic and atraumatic.  Eyes: Conjunctivae are normal. Right eye exhibits no discharge. Left eye exhibits no discharge. No scleral icterus.  Musculoskeletal: Normal range of motion. He exhibits no tenderness.  Neurological: He is alert and oriented to person, place, and time. He exhibits normal muscle tone.  Skin: Skin is warm and dry. He is not diaphoretic.  Psychiatric: He has a normal mood and affect. His behavior is normal. Judgment and thought content normal.      Assessment & Plan:   Please see problem oriented charting.

## 2013-08-08 NOTE — Assessment & Plan Note (Signed)
He has not had any flares of his gout recently.  His uric acid was under 6.0 on the allopurinol 300 mg daily 2 months ago.  We will overlap this allopurinol dose with 6 months of colchicine therapy, after which we will discontinue the colchicine and manage his gout with allopurinol 300 mg daily alone.

## 2013-08-08 NOTE — Assessment & Plan Note (Signed)
We are still trying to get the endoscopy reports from his GI specialist to determine what colon cancer screening is necessary.  He was given a pneumovax shot today given his diagnosis of diabetes.

## 2013-08-08 NOTE — Assessment & Plan Note (Signed)
He is without any acute symptoms that would suggest decompensation of his cardiomyopathy.  His afterload is very well controlled on the current regimen although we are making some changes in his blood pressure regimen as noted under hypertension in order to consolidate his regimen to decrease costs and improve ease.  Once we stabilize his regimen we will think about changing the furosemide to as needed based upon daily weights.

## 2013-08-08 NOTE — Assessment & Plan Note (Signed)
He states that he has been using the CPAP faithfully, switched masks, had adjustments made for humidification, etc but has not noticed any symptomatic relief from the therapy at this time.  In addition,the therapy has adversely impacted upon his ability to sleep comfortably as he generally sleeps on his sides and stomach and the mask does not allow him to do so.  We will therefore discontinue the CPAP therapy at this time given the lack of symptomatic relief and work on his other chronic medical conditions and work on weight loss and sleep hygiene.  If necessary, will revisit this issue in the future.

## 2013-08-13 ENCOUNTER — Encounter (HOSPITAL_COMMUNITY): Payer: Medicare Other

## 2013-08-17 ENCOUNTER — Ambulatory Visit (HOSPITAL_COMMUNITY)
Admission: RE | Admit: 2013-08-17 | Discharge: 2013-08-17 | Disposition: A | Discharge status: Home / Self Care | Payer: Medicare Other | Source: Ambulatory Visit | Attending: Internal Medicine | Admitting: Internal Medicine

## 2013-08-17 DIAGNOSIS — J45909 Unspecified asthma, uncomplicated: Secondary | ICD-10-CM | POA: Diagnosis present

## 2013-08-17 MED ORDER — ALBUTEROL SULFATE (5 MG/ML) 0.5% IN NEBU
2.5000 mg | INHALATION_SOLUTION | Freq: Once | RESPIRATORY_TRACT | Status: AC
  Administered 2013-08-17: 2.5 mg via RESPIRATORY_TRACT

## 2013-08-19 ENCOUNTER — Encounter: Payer: Self-pay | Admitting: Internal Medicine

## 2013-08-19 ENCOUNTER — Other Ambulatory Visit: Payer: Self-pay | Admitting: Internal Medicine

## 2013-08-19 DIAGNOSIS — J449 Chronic obstructive pulmonary disease, unspecified: Secondary | ICD-10-CM

## 2013-08-19 MED ORDER — IPRATROPIUM BROMIDE HFA 17 MCG/ACT IN AERS: 2.0000 | INHALATION_SPRAY | Freq: Four times a day (QID) | RESPIRATORY_TRACT | Status: DC

## 2013-08-19 NOTE — Progress Notes (Signed)
PFTs suggest GOLD Stage II COPD.  I called Juan Calderon and he is agreeable to try Atrovent MDI 4 puffs QID and continue the albuterol MDI 2 puffs Q6H prn.  We will reassess his symptoms at the follow-up visit with this new regimen.

## 2013-08-20 ENCOUNTER — Other Ambulatory Visit: Payer: Self-pay | Admitting: *Deleted

## 2013-08-20 DIAGNOSIS — G894 Chronic pain syndrome: Secondary | ICD-10-CM

## 2013-08-20 NOTE — Telephone Encounter (Signed)
Pt wants refill on pain meds before we close for christmas.  Last refill on 11/26 Pt # [phone]

## 2013-08-21 ENCOUNTER — Ambulatory Visit (INDEPENDENT_AMBULATORY_CARE_PROVIDER_SITE_OTHER): Payer: Medicare Other | Admitting: *Deleted

## 2013-08-21 DIAGNOSIS — Z7901 Long term (current) use of anticoagulants: Secondary | ICD-10-CM | POA: Diagnosis not present

## 2013-08-21 DIAGNOSIS — I4891 Unspecified atrial fibrillation: Secondary | ICD-10-CM

## 2013-08-21 LAB — POCT INR: INR: 2.5

## 2013-08-24 MED ORDER — OXYCODONE-ACETAMINOPHEN 10-325 MG PO TABS: 1.0000 | ORAL_TABLET | Freq: Four times a day (QID) | ORAL | Status: DC | PRN

## 2013-08-24 NOTE — Telephone Encounter (Signed)
Pt aware Rx is ready. Stanton Kidney Rumeal Cullipher RN 08/24/13 9AM

## 2013-09-07 ENCOUNTER — Ambulatory Visit: Payer: Self-pay | Admitting: Podiatry

## 2013-09-17 ENCOUNTER — Ambulatory Visit: Payer: Self-pay | Admitting: Podiatry

## 2013-09-21 ENCOUNTER — Encounter (HOSPITAL_COMMUNITY): Payer: Self-pay | Admitting: Emergency Medicine

## 2013-09-21 ENCOUNTER — Emergency Department (HOSPITAL_COMMUNITY): Payer: Medicare Other

## 2013-09-21 ENCOUNTER — Emergency Department (HOSPITAL_COMMUNITY)
Admission: EM | Admit: 2013-09-21 | Discharge: 2013-09-21 | Disposition: A | Discharge status: Home / Self Care | Payer: Medicare Other | Attending: Emergency Medicine | Admitting: Emergency Medicine

## 2013-09-21 DIAGNOSIS — I4891 Unspecified atrial fibrillation: Secondary | ICD-10-CM | POA: Insufficient documentation

## 2013-09-21 DIAGNOSIS — R05 Cough: Secondary | ICD-10-CM | POA: Diagnosis not present

## 2013-09-21 DIAGNOSIS — I10 Essential (primary) hypertension: Secondary | ICD-10-CM | POA: Insufficient documentation

## 2013-09-21 DIAGNOSIS — M1710 Unilateral primary osteoarthritis, unspecified knee: Secondary | ICD-10-CM | POA: Insufficient documentation

## 2013-09-21 DIAGNOSIS — IMO0002 Reserved for concepts with insufficient information to code with codable children: Secondary | ICD-10-CM | POA: Diagnosis not present

## 2013-09-21 DIAGNOSIS — I2510 Atherosclerotic heart disease of native coronary artery without angina pectoris: Secondary | ICD-10-CM | POA: Diagnosis not present

## 2013-09-21 DIAGNOSIS — G4733 Obstructive sleep apnea (adult) (pediatric): Secondary | ICD-10-CM | POA: Insufficient documentation

## 2013-09-21 DIAGNOSIS — R0602 Shortness of breath: Secondary | ICD-10-CM | POA: Diagnosis not present

## 2013-09-21 DIAGNOSIS — E785 Hyperlipidemia, unspecified: Secondary | ICD-10-CM | POA: Diagnosis not present

## 2013-09-21 DIAGNOSIS — N529 Male erectile dysfunction, unspecified: Secondary | ICD-10-CM | POA: Diagnosis not present

## 2013-09-21 DIAGNOSIS — H409 Unspecified glaucoma: Secondary | ICD-10-CM | POA: Diagnosis not present

## 2013-09-21 DIAGNOSIS — R509 Fever, unspecified: Secondary | ICD-10-CM | POA: Insufficient documentation

## 2013-09-21 DIAGNOSIS — Z6841 Body Mass Index (BMI) 40.0 and over, adult: Secondary | ICD-10-CM | POA: Insufficient documentation

## 2013-09-21 DIAGNOSIS — R062 Wheezing: Secondary | ICD-10-CM | POA: Diagnosis not present

## 2013-09-21 DIAGNOSIS — M47812 Spondylosis without myelopathy or radiculopathy, cervical region: Secondary | ICD-10-CM | POA: Insufficient documentation

## 2013-09-21 DIAGNOSIS — Z888 Allergy status to other drugs, medicaments and biological substances status: Secondary | ICD-10-CM | POA: Diagnosis not present

## 2013-09-21 DIAGNOSIS — H4010X0 Unspecified open-angle glaucoma, stage unspecified: Secondary | ICD-10-CM | POA: Insufficient documentation

## 2013-09-21 DIAGNOSIS — Z7901 Long term (current) use of anticoagulants: Secondary | ICD-10-CM | POA: Insufficient documentation

## 2013-09-21 DIAGNOSIS — Z79899 Other long term (current) drug therapy: Secondary | ICD-10-CM | POA: Insufficient documentation

## 2013-09-21 DIAGNOSIS — M109 Gout, unspecified: Secondary | ICD-10-CM | POA: Diagnosis not present

## 2013-09-21 DIAGNOSIS — E1142 Type 2 diabetes mellitus with diabetic polyneuropathy: Secondary | ICD-10-CM | POA: Diagnosis not present

## 2013-09-21 DIAGNOSIS — J449 Chronic obstructive pulmonary disease, unspecified: Secondary | ICD-10-CM | POA: Insufficient documentation

## 2013-09-21 DIAGNOSIS — G894 Chronic pain syndrome: Secondary | ICD-10-CM | POA: Insufficient documentation

## 2013-09-21 DIAGNOSIS — Z5189 Encounter for other specified aftercare: Secondary | ICD-10-CM | POA: Insufficient documentation

## 2013-09-21 DIAGNOSIS — K219 Gastro-esophageal reflux disease without esophagitis: Secondary | ICD-10-CM | POA: Diagnosis not present

## 2013-09-21 DIAGNOSIS — E1149 Type 2 diabetes mellitus with other diabetic neurological complication: Secondary | ICD-10-CM | POA: Insufficient documentation

## 2013-09-21 DIAGNOSIS — H269 Unspecified cataract: Secondary | ICD-10-CM | POA: Insufficient documentation

## 2013-09-21 DIAGNOSIS — J209 Acute bronchitis, unspecified: Secondary | ICD-10-CM | POA: Diagnosis not present

## 2013-09-21 DIAGNOSIS — I5032 Chronic diastolic (congestive) heart failure: Secondary | ICD-10-CM | POA: Insufficient documentation

## 2013-09-21 DIAGNOSIS — J40 Bronchitis, not specified as acute or chronic: Secondary | ICD-10-CM

## 2013-09-21 DIAGNOSIS — Z8709 Personal history of other diseases of the respiratory system: Secondary | ICD-10-CM | POA: Insufficient documentation

## 2013-09-21 DIAGNOSIS — J4489 Other specified chronic obstructive pulmonary disease: Secondary | ICD-10-CM | POA: Insufficient documentation

## 2013-09-21 DIAGNOSIS — R059 Cough, unspecified: Secondary | ICD-10-CM | POA: Diagnosis not present

## 2013-09-21 HISTORY — DX: Chronic obstructive pulmonary disease, unspecified: J44.9

## 2013-09-21 LAB — CBC
HEMATOCRIT: 41.6 % (ref 39.0–52.0)
HEMOGLOBIN: 14 g/dL (ref 13.0–17.0)
MCH: 31.5 pg (ref 26.0–34.0)
MCHC: 33.7 g/dL (ref 30.0–36.0)
MCV: 93.5 fL (ref 78.0–100.0)
Platelets: 150 10*3/uL (ref 150–400)
RBC: 4.45 MIL/uL (ref 4.22–5.81)
RDW: 14.2 % (ref 11.5–15.5)
WBC: 4.9 10*3/uL (ref 4.0–10.5)

## 2013-09-21 LAB — POCT I-STAT TROPONIN I: Troponin i, poc: 0.02 ng/mL (ref 0.00–0.08)

## 2013-09-21 LAB — BASIC METABOLIC PANEL
BUN: 16 mg/dL (ref 6–23)
CHLORIDE: 99 meq/L (ref 96–112)
CO2: 28 mEq/L (ref 19–32)
Calcium: 9.2 mg/dL (ref 8.4–10.5)
Creatinine, Ser: 1.49 mg/dL — ABNORMAL HIGH (ref 0.50–1.35)
GFR calc non Af Amer: 50 mL/min — ABNORMAL LOW (ref >90)
GFR, EST AFRICAN AMERICAN: 58 mL/min — AB (ref >90)
Glucose, Bld: 113 mg/dL — ABNORMAL HIGH (ref 70–99)
POTASSIUM: 3.9 meq/L (ref 3.7–5.3)
Sodium: 141 mEq/L (ref 137–147)

## 2013-09-21 LAB — PROTIME-INR
INR: 2.45 — AB (ref 0.00–1.49)
PROTHROMBIN TIME: 25.8 s — AB (ref 11.6–15.2)

## 2013-09-21 MED ORDER — ACETAMINOPHEN 325 MG PO TABS
650.0000 mg | ORAL_TABLET | Freq: Once | ORAL | Status: AC
  Administered 2013-09-21: 650 mg via ORAL
  Filled 2013-09-21: qty 2

## 2013-09-21 MED ORDER — AZITHROMYCIN 250 MG PO TABS: 250.0000 mg | ORAL_TABLET | Freq: Every day | ORAL | Status: DC

## 2013-09-21 MED ORDER — PREDNISONE 20 MG PO TABS: 40.0000 mg | ORAL_TABLET | Freq: Every day | ORAL | Status: DC

## 2013-09-21 MED ORDER — IPRATROPIUM BROMIDE 0.02 % IN SOLN
0.5000 mg | Freq: Once | RESPIRATORY_TRACT | Status: AC
  Administered 2013-09-21: 0.5 mg via RESPIRATORY_TRACT
  Filled 2013-09-21: qty 2.5

## 2013-09-21 MED ORDER — PREDNISONE 20 MG PO TABS
60.0000 mg | ORAL_TABLET | Freq: Once | ORAL | Status: AC
  Administered 2013-09-21: 60 mg via ORAL
  Filled 2013-09-21: qty 3

## 2013-09-21 MED ORDER — ALBUTEROL SULFATE (2.5 MG/3ML) 0.083% IN NEBU
5.0000 mg | INHALATION_SOLUTION | Freq: Once | RESPIRATORY_TRACT | Status: AC
  Administered 2013-09-21: 5 mg via RESPIRATORY_TRACT
  Filled 2013-09-21: qty 6

## 2013-09-21 NOTE — Discharge Instructions (Signed)

## 2013-09-21 NOTE — ED Notes (Signed)
Pt with hx of copd and afib c/o productive cough x 3 days.  Pt has been using inhaler with some relief.  Denies increased LE/abdominal edema.
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
## 2013-09-24 ENCOUNTER — Telehealth: Payer: Self-pay | Admitting: *Deleted

## 2013-09-24 DIAGNOSIS — J449 Chronic obstructive pulmonary disease, unspecified: Secondary | ICD-10-CM

## 2013-09-24 MED ORDER — TIOTROPIUM BROMIDE MONOHYDRATE 18 MCG IN CAPS: 18.0000 ug | ORAL_CAPSULE | Freq: Every day | RESPIRATORY_TRACT | Status: DC

## 2013-09-24 NOTE — Telephone Encounter (Signed)
Pt called and stated that he had blurred vision for 1/2 a day after taking Atrovent inhaler.  He has not used since. He had no other new meds at the time. He wanted you to know.  Pt # [phone]

## 2013-09-24 NOTE — Telephone Encounter (Signed)
Called patient and confirmed the blurry vision, which has since resolved.  I asked if he may have got any in his eye and he adamantly denies.  We decided to try tiotropium 1 inhalation daily, discontinue the atrovent, and continue the PRN albuterol.  We will reassess his respiratory symptoms at the follow-up appointment.

## 2013-09-30 ENCOUNTER — Encounter: Payer: Self-pay | Admitting: Podiatry

## 2013-09-30 ENCOUNTER — Ambulatory Visit (INDEPENDENT_AMBULATORY_CARE_PROVIDER_SITE_OTHER): Payer: Medicare Other | Admitting: Podiatry

## 2013-09-30 VITALS — BP 157/96 | HR 86 | Resp 18

## 2013-09-30 DIAGNOSIS — M79609 Pain in unspecified limb: Secondary | ICD-10-CM | POA: Diagnosis not present

## 2013-09-30 DIAGNOSIS — B351 Tinea unguium: Secondary | ICD-10-CM

## 2013-09-30 NOTE — Progress Notes (Signed)
   Subjective:    Patient ID: Juan Calderon, male    DOB: 02/23/1956, 58 y.o.   MRN: [id number]  HPI I am just to have my toenails trimmed Patient presents for ongoing debridement of painful mycotic toenails. Last visit for this service was 03/30/2013.   Review of Systems     Objective:   Physical Exam  Black male orientated x3 Hypertrophic, elongated, discolored toenails x10      Assessment & Plan:  Assessment: Symptomatic onychomycoses x10  Plan: Debridement of toenails x10 without a bleeding. Reappoint at three-month intervals

## 2013-10-02 ENCOUNTER — Ambulatory Visit (INDEPENDENT_AMBULATORY_CARE_PROVIDER_SITE_OTHER): Payer: Medicare Other | Admitting: Pharmacist

## 2013-10-02 DIAGNOSIS — Z7901 Long term (current) use of anticoagulants: Secondary | ICD-10-CM | POA: Diagnosis not present

## 2013-10-02 DIAGNOSIS — I4891 Unspecified atrial fibrillation: Secondary | ICD-10-CM | POA: Diagnosis not present

## 2013-10-02 LAB — POCT INR: INR: 3.1

## 2013-10-05 ENCOUNTER — Ambulatory Visit: Payer: Self-pay | Admitting: Podiatry

## 2013-11-07 ENCOUNTER — Other Ambulatory Visit: Payer: Self-pay | Admitting: Internal Medicine

## 2013-11-07 DIAGNOSIS — J449 Chronic obstructive pulmonary disease, unspecified: Secondary | ICD-10-CM

## 2013-11-12 ENCOUNTER — Encounter: Payer: Self-pay | Admitting: Internal Medicine

## 2013-11-12 ENCOUNTER — Ambulatory Visit (INDEPENDENT_AMBULATORY_CARE_PROVIDER_SITE_OTHER): Payer: Medicare Other | Admitting: Internal Medicine

## 2013-11-12 VITALS — BP 132/94 | HR 81 | Temp 97.0°F | Ht 71.0 in | Wt 304.1 lb

## 2013-11-12 DIAGNOSIS — E119 Type 2 diabetes mellitus without complications: Secondary | ICD-10-CM | POA: Diagnosis not present

## 2013-11-12 DIAGNOSIS — R0602 Shortness of breath: Secondary | ICD-10-CM | POA: Diagnosis not present

## 2013-11-12 DIAGNOSIS — E1142 Type 2 diabetes mellitus with diabetic polyneuropathy: Secondary | ICD-10-CM

## 2013-11-12 DIAGNOSIS — M47812 Spondylosis without myelopathy or radiculopathy, cervical region: Secondary | ICD-10-CM | POA: Diagnosis not present

## 2013-11-12 DIAGNOSIS — K5790 Diverticulosis of intestine, part unspecified, without perforation or abscess without bleeding: Secondary | ICD-10-CM

## 2013-11-12 DIAGNOSIS — B351 Tinea unguium: Secondary | ICD-10-CM | POA: Diagnosis not present

## 2013-11-12 DIAGNOSIS — Z6841 Body Mass Index (BMI) 40.0 and over, adult: Secondary | ICD-10-CM

## 2013-11-12 DIAGNOSIS — H4010X0 Unspecified open-angle glaucoma, stage unspecified: Secondary | ICD-10-CM | POA: Diagnosis not present

## 2013-11-12 DIAGNOSIS — I4891 Unspecified atrial fibrillation: Secondary | ICD-10-CM | POA: Diagnosis not present

## 2013-11-12 DIAGNOSIS — M109 Gout, unspecified: Secondary | ICD-10-CM | POA: Diagnosis not present

## 2013-11-12 DIAGNOSIS — I5032 Chronic diastolic (congestive) heart failure: Secondary | ICD-10-CM | POA: Diagnosis not present

## 2013-11-12 DIAGNOSIS — I2510 Atherosclerotic heart disease of native coronary artery without angina pectoris: Secondary | ICD-10-CM

## 2013-11-12 DIAGNOSIS — Z0000 Encounter for general adult medical examination without abnormal findings: Secondary | ICD-10-CM

## 2013-11-12 DIAGNOSIS — E785 Hyperlipidemia, unspecified: Secondary | ICD-10-CM | POA: Diagnosis not present

## 2013-11-12 DIAGNOSIS — Z7901 Long term (current) use of anticoagulants: Secondary | ICD-10-CM | POA: Diagnosis not present

## 2013-11-12 DIAGNOSIS — R7309 Other abnormal glucose: Secondary | ICD-10-CM | POA: Diagnosis not present

## 2013-11-12 DIAGNOSIS — K219 Gastro-esophageal reflux disease without esophagitis: Secondary | ICD-10-CM

## 2013-11-12 DIAGNOSIS — J449 Chronic obstructive pulmonary disease, unspecified: Secondary | ICD-10-CM

## 2013-11-12 DIAGNOSIS — N529 Male erectile dysfunction, unspecified: Secondary | ICD-10-CM | POA: Diagnosis not present

## 2013-11-12 DIAGNOSIS — R079 Chest pain, unspecified: Secondary | ICD-10-CM | POA: Diagnosis not present

## 2013-11-12 DIAGNOSIS — I10 Essential (primary) hypertension: Secondary | ICD-10-CM

## 2013-11-12 DIAGNOSIS — G894 Chronic pain syndrome: Secondary | ICD-10-CM | POA: Diagnosis not present

## 2013-11-12 DIAGNOSIS — R635 Abnormal weight gain: Secondary | ICD-10-CM | POA: Diagnosis not present

## 2013-11-12 DIAGNOSIS — H04129 Dry eye syndrome of unspecified lacrimal gland: Secondary | ICD-10-CM | POA: Diagnosis not present

## 2013-11-12 DIAGNOSIS — J45909 Unspecified asthma, uncomplicated: Secondary | ICD-10-CM | POA: Diagnosis not present

## 2013-11-12 DIAGNOSIS — I25810 Atherosclerosis of coronary artery bypass graft(s) without angina pectoris: Secondary | ICD-10-CM | POA: Diagnosis not present

## 2013-11-12 DIAGNOSIS — J309 Allergic rhinitis, unspecified: Secondary | ICD-10-CM | POA: Diagnosis not present

## 2013-11-12 DIAGNOSIS — N521 Erectile dysfunction due to diseases classified elsewhere: Secondary | ICD-10-CM

## 2013-11-12 DIAGNOSIS — E1149 Type 2 diabetes mellitus with other diabetic neurological complication: Secondary | ICD-10-CM

## 2013-11-12 DIAGNOSIS — T148XXA Other injury of unspecified body region, initial encounter: Secondary | ICD-10-CM | POA: Diagnosis not present

## 2013-11-12 DIAGNOSIS — E1140 Type 2 diabetes mellitus with diabetic neuropathy, unspecified: Secondary | ICD-10-CM

## 2013-11-12 DIAGNOSIS — T148 Other injury of unspecified body region: Secondary | ICD-10-CM | POA: Diagnosis not present

## 2013-11-12 DIAGNOSIS — R1031 Right lower quadrant pain: Secondary | ICD-10-CM | POA: Diagnosis not present

## 2013-11-12 HISTORY — DX: Diverticulosis of intestine, part unspecified, without perforation or abscess without bleeding: K57.90

## 2013-11-12 LAB — POCT GLYCOSYLATED HEMOGLOBIN (HGB A1C): HEMOGLOBIN A1C: 7.5

## 2013-11-12 LAB — GLUCOSE, CAPILLARY: Glucose-Capillary: 214 mg/dL — ABNORMAL HIGH (ref 70–99)

## 2013-11-12 MED ORDER — OXYCODONE-ACETAMINOPHEN 10-325 MG PO TABS: 1.0000 | ORAL_TABLET | Freq: Four times a day (QID) | ORAL | Status: DC | PRN

## 2013-11-12 MED ORDER — VALSARTAN 160 MG PO TABS: 160.0000 mg | ORAL_TABLET | Freq: Every day | ORAL | Status: DC

## 2013-11-12 MED ORDER — SILDENAFIL CITRATE 100 MG PO TABS: 100.0000 mg | ORAL_TABLET | ORAL | Status: DC | PRN

## 2013-11-12 NOTE — Assessment & Plan Note (Signed)
He has not had any chest pain requiring as needed nitroglycerin while on the metoprolol 100 mg by mouth daily. We will continue his other antihypertensives, his atorvastatin, and work on improved diabetic control.

## 2013-11-12 NOTE — Assessment & Plan Note (Signed)
His gastroesophageal reflux disease is well controlled on the Nexium 40 mg by mouth every morning. We will continue this therapy.

## 2013-11-12 NOTE — Assessment & Plan Note (Signed)
The osteoarthritis of his neck is well-controlled on the diclofenac. We will continue at 50 mg by mouth twice daily.

## 2013-11-12 NOTE — Assessment & Plan Note (Signed)
Although his weight is elevated today he has minimal right lower extremity edema and no inspiratory rales. I believe the weight gain is adipose rather than fluid. Therefore his chronic diastolic heart failure is well compensated on the Lasix, metoprolol 100 mg by mouth twice daily, and valsartan 80 mg by mouth daily. Because he has an elevation in his diastolic blood pressure since coming off the clonidine we will increase the valsartan to 160 mg by mouth daily. We will reassess the blood pressure at the followup visit.

## 2013-11-12 NOTE — Assessment & Plan Note (Signed)
We were able to get the endoscopic records from his previous gastroenterologist. He underwent a colonoscopy on 06/02/2009 by Dr. Franchot Erichsen. The colonoscopy demonstrated diverticulosis but no evidence of polyps. He is therefore not due for a repeat colonoscopy until September 2020. His preventative health maintenance record was updated to reflect this new information.
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
## 2013-11-12 NOTE — Assessment & Plan Note (Signed)
His diastolic blood pressure was above target today. This is while he is on valsartan 80 mg by mouth daily, metoprolol 100 mg by mouth twice a day, Terazosin 5 mg by mouth at night, and Lasix. As we would like to optimize his heart failure regimen I increased the valsartan to 160 mg by mouth daily. All of the other medications were kept at the same dose. We will reassess the control of his blood pressure at the followup visit.

## 2013-11-12 NOTE — Assessment & Plan Note (Addendum)
The hemoglobin A1c today was 7.5 which is above his target. This is the first time in the last year and a half that I've been caring for him that he's had an elevated hemoglobin A1c. Since the last visit he has also had a 9 pound increase in weight. This is likely contributing to the decompensation in his diabetic control. We discussed the importance of weight loss and diet in managing the diabetes. We reviewed the options of nutritional counseling versus pharmacologic therapy in order to improve his diabetic control. Before starting medications he was interested in getting more education on his diabetes and on nutrition. A referral was therefore sent to our diabetic educator and nutritionist Ms. Butch Penny Plyler in hopes of getting better control of his diabetes without the use of pharmacotherapy. If this is ineffective we will consider the addition of metformin at the followup visit. His diastolic blood pressure was slightly elevated today, again above target for someone with diabetes. We have increased the Diovan from 80 mg daily to 160 mg daily in hopes of improving this. Otherwise, he is up-to-date on all of his diabetic health maintenance. At the followup visit we will obtain a repeat hemoglobin A1c to reassess his diabetic control.  With regards to his erectile dysfunction associated with his diabetes he found the Cialis 10 mg to be ineffective. He has a penile pump at home but is unable to maintain an erection with this device. We discussed the option of alprostadil given he is on as needed nitrate therapy. The cost of this medication is likely prohibitive and he was not interested at this time. He was interested in giving another PGE a try and I therefore prescribed Viagra 100 mg as needed. We will assess the clinical effectiveness of this therapy and dose at the followup visit. He is aware that he must not take his sublingual mitral closer and within 24 hours of Viagra dose.

## 2013-11-12 NOTE — Assessment & Plan Note (Signed)
He continues to have some dyspnea on exertion and occasional wheezing. He has taken the Spiriva without symptomatic improvement. We reviewed his technique and it appears to be fine. He is also on the as needed albuterol for his wheezing. He uses this every 4-6 hours, but continues to have the wheezing. I am concerned that the medication is not being appropriately delivered deep into the lungs as he is not using a spacer. He was provided a spacer during this visit as well as teaching on how to use it. We will reassess his dyspnea and wheezing at the followup visit after consistent use of the spacer. I am concerned that his obstructive disease may be asthma rather than COPD given the fact that he is a lifelong nonsmoker. If the spacer does not improve his symptoms we will consider switching the diagnosis to asthma and starting an inhaled steroid to see if we can get control of his dyspnea and wheezing.
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
## 2013-11-12 NOTE — Assessment & Plan Note (Signed)
His LDL was 25 within the past year on the atorvastatin 40 mg by mouth daily. This is at target. At the followup visit he will be due for a reevaluation of his lipids.

## 2013-11-12 NOTE — Assessment & Plan Note (Signed)
On exam, his heart rate sounded regular although I did not confirm he was in normal sinus rhythm by ECG. His heart rate was in the low 80s. This is off of the digoxin. We will continue the metoprolol at 100 mg by mouth twice daily as a rate control measure. He is also to continue on the Coumadin for anticoagulation.

## 2013-11-12 NOTE — Assessment & Plan Note (Signed)
His weight went up 9 pounds since the previous visit. A referral was made to our nutritionist for diabetic and nutritional counseling. We will reassess his weight at the followup visit.

## 2013-11-12 NOTE — Patient Instructions (Signed)
It was good to see you today.  1) Your diabetes is not as well controlled as before.  I suspect this may be secondary to the none pound weight gain.  Rather than starting medication I will send you to a nutritionist here in the clinic for dietary teaching.  2) I increased your Diovan to 160 mg by mouth daily.  Take 2 tablets until you get the new bottle and then go back to 1 tablet daily as it will be the higher dose.  3) I gave you a spacer to use with your albuterol and taught you how to use it.  Please use it when you use the albuterol to see if it helps the wheezing.  4) I provided you with a prescription for Viagra to see if that may help with the erectile dysfunction.  You use it just like the cialis  5) Keep taking the other medications as you are with the exception of the colchicine which we stopped today.  6) I will see you back in 3 months, sooner if necessary.

## 2013-11-12 NOTE — Assessment & Plan Note (Signed)
He has had no further flares of his gout on the allopurinol 300 mg by mouth daily and colchicine. It has been nearly 6 months since his uric acid was driven below 6.0 with the allopurinol. Therefore the colchicine was stopped at this visit as he likely only requires the allopurinol to control his gout from this point forward.

## 2013-11-13 ENCOUNTER — Encounter: Payer: Medicare Other | Admitting: Internal Medicine

## 2013-11-16 ENCOUNTER — Ambulatory Visit (INDEPENDENT_AMBULATORY_CARE_PROVIDER_SITE_OTHER): Payer: Medicare Other | Admitting: *Deleted

## 2013-11-16 DIAGNOSIS — Z5181 Encounter for therapeutic drug level monitoring: Secondary | ICD-10-CM

## 2013-11-16 DIAGNOSIS — I4891 Unspecified atrial fibrillation: Secondary | ICD-10-CM

## 2013-11-16 DIAGNOSIS — Z7901 Long term (current) use of anticoagulants: Secondary | ICD-10-CM

## 2013-11-16 LAB — POCT INR: INR: 2.8

## 2013-12-11 ENCOUNTER — Ambulatory Visit (INDEPENDENT_AMBULATORY_CARE_PROVIDER_SITE_OTHER): Payer: Medicare Other | Admitting: Dietician

## 2013-12-11 ENCOUNTER — Encounter: Payer: Self-pay | Admitting: Dietician

## 2013-12-11 VITALS — Wt 309.4 lb

## 2013-12-11 DIAGNOSIS — E1142 Type 2 diabetes mellitus with diabetic polyneuropathy: Secondary | ICD-10-CM | POA: Diagnosis not present

## 2013-12-11 DIAGNOSIS — E1140 Type 2 diabetes mellitus with diabetic neuropathy, unspecified: Secondary | ICD-10-CM

## 2013-12-11 DIAGNOSIS — N521 Erectile dysfunction due to diseases classified elsewhere: Secondary | ICD-10-CM

## 2013-12-11 DIAGNOSIS — N529 Male erectile dysfunction, unspecified: Secondary | ICD-10-CM | POA: Diagnosis not present

## 2013-12-11 DIAGNOSIS — E1149 Type 2 diabetes mellitus with other diabetic neurological complication: Secondary | ICD-10-CM | POA: Diagnosis not present

## 2013-12-11 NOTE — Patient Instructions (Signed)
Please make a follow up appointment for 3 weeks.  You are welcome to bring any support persons to any of your visits with me.   Please be thinking about what changes you can make to eat less calories.
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
## 2013-12-21 ENCOUNTER — Ambulatory Visit (INDEPENDENT_AMBULATORY_CARE_PROVIDER_SITE_OTHER): Payer: Medicare Other | Admitting: Podiatry

## 2013-12-21 ENCOUNTER — Encounter: Payer: Self-pay | Admitting: Podiatry

## 2013-12-21 VITALS — BP 138/83 | HR 93

## 2013-12-21 DIAGNOSIS — B351 Tinea unguium: Secondary | ICD-10-CM

## 2013-12-21 DIAGNOSIS — M79609 Pain in unspecified limb: Secondary | ICD-10-CM

## 2013-12-22 ENCOUNTER — Other Ambulatory Visit: Payer: Self-pay | Admitting: *Deleted

## 2013-12-22 ENCOUNTER — Other Ambulatory Visit: Payer: Self-pay | Admitting: Internal Medicine

## 2013-12-22 DIAGNOSIS — J300 Vasomotor rhinitis: Secondary | ICD-10-CM

## 2013-12-22 DIAGNOSIS — I4891 Unspecified atrial fibrillation: Secondary | ICD-10-CM

## 2013-12-22 MED ORDER — WARFARIN SODIUM 5 MG PO TABS: ORAL_TABLET | ORAL | Status: DC

## 2013-12-22 NOTE — Progress Notes (Signed)
Patient ID: Juan Calderon, male   DOB: 13-Mar-1956, 58 y.o.   MRN: [id number]  Subjective: This patient presents today complaining of a painful left hallux toenail.  Objective: The left hallux nail is hypertrophic, elongated, discolored and tender. The remaining toenails on right left feet and occasional texture and color changes.  Assessment: Symptomatic onychomycoses 1 through 5  Plan: The left hallux nail was debrided without any bleeding. Reappoint at three-month intervals.

## 2013-12-28 ENCOUNTER — Ambulatory Visit (INDEPENDENT_AMBULATORY_CARE_PROVIDER_SITE_OTHER): Payer: Medicare Other

## 2013-12-28 DIAGNOSIS — Z5181 Encounter for therapeutic drug level monitoring: Secondary | ICD-10-CM

## 2013-12-28 DIAGNOSIS — Z7901 Long term (current) use of anticoagulants: Secondary | ICD-10-CM

## 2013-12-28 DIAGNOSIS — I4891 Unspecified atrial fibrillation: Secondary | ICD-10-CM | POA: Diagnosis not present

## 2013-12-28 LAB — POCT INR: INR: 2.4

## 2014-01-01 ENCOUNTER — Ambulatory Visit (INDEPENDENT_AMBULATORY_CARE_PROVIDER_SITE_OTHER): Payer: Medicare Other | Admitting: Cardiovascular Disease

## 2014-01-01 ENCOUNTER — Encounter: Payer: Self-pay | Admitting: Cardiovascular Disease

## 2014-01-01 VITALS — BP 127/90 | HR 78 | Ht 71.0 in | Wt 307.0 lb

## 2014-01-01 DIAGNOSIS — I4891 Unspecified atrial fibrillation: Secondary | ICD-10-CM | POA: Diagnosis not present

## 2014-01-01 DIAGNOSIS — Z7901 Long term (current) use of anticoagulants: Secondary | ICD-10-CM | POA: Diagnosis not present

## 2014-01-01 DIAGNOSIS — I10 Essential (primary) hypertension: Secondary | ICD-10-CM

## 2014-01-01 DIAGNOSIS — I2510 Atherosclerotic heart disease of native coronary artery without angina pectoris: Secondary | ICD-10-CM | POA: Diagnosis not present

## 2014-01-01 DIAGNOSIS — I5032 Chronic diastolic (congestive) heart failure: Secondary | ICD-10-CM

## 2014-01-01 NOTE — Patient Instructions (Signed)
Your physician wants you to follow-up in:  12 months.  You will receive a reminder letter in the mail two months in advance. If you don't receive a letter, please call our office to schedule the follow-up appointment.   

## 2014-01-01 NOTE — Progress Notes (Signed)
History of Present Illness: 58 yo male with history of diastolic CHF, CAD, paroxysmal AFib, COPD, HTN, HLD, gout, GERD who is here today for cardiac follow up. I saw him as a new patient in January 2013. He was seen in the [facility] on 09/18/11 for HTN. Diovan was increased to 160 mg po Qdaily and he was referred to our office for f/u. He lives here and spends time with family in [place], [place] He had been followed by Dr. [name] At [facility] in [place], [place]. Echo 6/11: mild LVH, EF of 55%. mild LAE. Monitor April 2012 in [place], [place] showed NSR with episodes of sinus brady, rare PVCs, occasional PACs and brief episodes of atrial tachycardia. Per records he has been on Multaq in the past but did not like this medication so it was stopped. He had a stress myoview 09/14/05 that showed reversible ischemia in the inferior wall. This led to a cardiac cath on 10/01/05 which showed 25% mid LAD stenosis per report but no other evidence of CAD. Last Myoview 9/13: No ischemia. He was seen in the emergency room 08/23/12 with complaints of edema. BNP was mildly elevated. There was some suggestion of pulmonary venous congestion and possible pleural effusions on chest x-ray. At visit March 2014 his weight was up 7 lbs and his Lasix was increased. He was seen several times in f/u by Richardson Dopp, PA-C and not felt to be volume overloaded. TSH was normal. He was seen in primary care 04/24/13 and his renal function had worsened. Lasix was changed to 60 mg po QDaily and renal function returned to normal.   He is here today for follow up.  No chest pain. He has some SOB. He has been diagnosed with COPD recently.  Weight is up but no swelling, abdominal distention.    Primary Care Physician:  Verdene Lennert PA-C ([place] , [place]  [phone]   Past Medical History  Diagnosis Date  . Essential hypertension 09/20/2011  . Atrial fibrillation  02/04/2012  . Chronic diastolic heart failure 05/06/7168    with mild left ventricular hypertrophy on Echo 02/2010  . Chronic pain syndrome 01/15/2013    Left arm and leg s/p traumatic injury   . Osteoarthritis cervical spine 04/25/2013  . Gastroesophageal reflux disease 04/25/2013  . Open-angle glaucoma 04/25/2013  . 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 04/25/2013  . Hyperlipidemia LDL goal < 100 04/25/2013  . Coronary artery disease 04/25/2013    25% LAD stenosis on cath 2007.  Stable angina.  . Asthma, chronic 04/25/2013    Clinical diagnosis  . Gout 04/25/2013  . Right rotator cuff tear 04/25/2013    Large full-thickness tear of the supraspinatus with mild retraction but no atrophy   . Vasomotor rhinitis 04/25/2013  . Obstructive sleep apnea 06/01/2013    Moderate, AHI 29.8 per hour with moderately loud snoring and oxygen desaturation to a nadir of 79%. CPAP titration resulted in a prescription for 17 CWP.    [name] Ulcer     History of ulcer in the remote past  . Cataract     Left eye  . Onychomycosis of toenail 12/24/2012    Left great toe   . Morbid obesity with BMI of 40.0-44.9, adult 04/25/2013  . Osteoarthritis of right knee 06/19/2013  . Blood transfusion without reported diagnosis   . Chronic obstructive pulmonary disease 04/25/2013  . Diverticulosis 11/12/2013  Past Surgical History  Procedure Laterality Date  . Left leg surgery    . Left arm surgery    . Shoulder surgery      Right  . Fracture surgery Left 1980's    Elbow    Current Outpatient Prescriptions  Medication Sig Dispense Refill  . albuterol (PROAIR HFA) 108 (90 BASE) MCG/ACT inhaler Inhale 2 puffs into the lungs every 6 (six) hours as needed for wheezing or shortness of breath.  17 each  3  . allopurinol (ZYLOPRIM) 300 MG tablet Take 1 tablet (300 mg total) by mouth daily.  90 tablet  3  . ALPHAGAN P 0.15 % ophthalmic solution       . atorvastatin (LIPITOR) 40 MG tablet Take 1 tablet  (40 mg total) by mouth daily.  90 tablet  3  . ATROVENT HFA 17 MCG/ACT inhaler       . digoxin (LANOXIN) 0.125 MG tablet       . esomeprazole (NEXIUM) 40 MG capsule Take 1 capsule (40 mg total) by mouth daily before breakfast.  90 capsule  3  . fenofibrate 160 MG tablet Take 1 tablet (160 mg total) by mouth daily.  90 tablet  3  . furosemide (LASIX) 20 MG tablet Take 1 tablet (20 mg total) by mouth daily.  90 tablet  3  . furosemide (LASIX) 40 MG tablet Take 1 tablet (40 mg total) by mouth daily.  90 tablet  3  . latanoprost (XALATAN) 0.005 % ophthalmic solution       . loratadine (CLARITIN) 10 MG tablet Take 1 tablet (10 mg total) by mouth daily as needed for allergies.  90 tablet  3  . metoprolol (LOPRESSOR) 100 MG tablet Take 1 tablet (100 mg total) by mouth 2 (two) times daily.  180 tablet  3  . nitroGLYCERIN (NITROSTAT) 0.4 MG SL tablet Place 1 tablet (0.4 mg total) under the tongue every 5 (five) minutes as needed.  75 tablet  3  . oxyCODONE-acetaminophen (PERCOCET) 10-325 MG per tablet Take 1 tablet by mouth every 6 (six) hours as needed for pain.  120 tablet  0  . sildenafil (VIAGRA) 100 MG tablet Take 1 tablet (100 mg total) by mouth as needed for erectile dysfunction.  5 tablet  11  . terazosin (HYTRIN) 5 MG capsule Take 1 capsule (5 mg total) by mouth at bedtime.  90 capsule  3  . tiotropium (SPIRIVA HANDIHALER) 18 MCG inhalation capsule Place 1 capsule (18 mcg total) into inhaler and inhale daily.  30 capsule  11  . valsartan (DIOVAN) 160 MG tablet Take 1 tablet (160 mg total) by mouth daily.  90 tablet  3  . warfarin (COUMADIN) 5 MG tablet One tablet (5 mg) by mouth daily except one-half tablet (2.5 mg) by mouth on Mondays, Wednesdays, and Fridays  21 tablet  1   No current facility-administered medications for this visit.    Allergies  Allergen Reactions  . Ramipril Swelling  . Testosterone Rash    History   Social History  . Marital Status: Widowed    Spouse Name: N/A     Number of Children: N/A  . Years of Education: N/A   Occupational History  . Not on file.   Social History Main Topics  . Smoking status: Never Smoker   . Smokeless tobacco: Never Used  . Alcohol Use: 1.0 oz/week    2 drink(s) per week     Comment: Liquor.  . Drug Use: No  .  Sexual Activity: Yes    Birth Control/ Protection: None   Other Topics Concern  . Not on file   Social History Narrative  . No narrative on file    Family History  Problem Relation Age of Onset  . Heart failure Mother   . Alzheimer's disease Father   . Healthy Sister   . Healthy Brother   . Healthy Son   . Healthy Sister   . Healthy Sister   . Healthy Sister   . Healthy Sister   . Healthy Brother   . Healthy Brother   . Healthy Brother   . Heart failure Brother   . Healthy Brother   . Osteoarthritis Brother   . Prostate cancer Brother   . Early death Brother     Gun Shot Wound    Review of Systems:  As stated in the HPI and otherwise negative.   BP 127/90  Pulse 78  Ht 5\' 11"  (1.803 m)  Wt 307 lb (139.254 kg)  BMI 42.84 kg/m2  Physical Examination: General: Well developed, well nourished, NAD HEENT: OP clear, mucus membranes moist SKIN: warm, dry. No rashes. Neuro: No focal deficits Musculoskeletal: Muscle strength 5/5 all ext Psychiatric: Mood and affect normal Neck: No JVD, no carotid bruits, no thyromegaly, no lymphadenopathy. Lungs:Clear bilaterally, no wheezes, rhonci, crackles Cardiovascular: Irreg irreg rate and rhythm. No murmurs, gallops or rubs. Abdomen:Soft. Bowel sounds present. Non-tender.  Extremities: No lower extremity edema. Pulses are 2 + in the bilateral DP/PT.  Assessment and Plan:   1. Chronic Diastolic CHF: Weight is up but no signs of volume overload. Continue Lasix 60 mg po Qdaily.   2. Atrial Fibrillation: Rate controlled on Digoxin and Lopressor 50 mg po BID. He remains on Coumadin. He is followed in our coumadin clinic.   3. Hypertension:  Controlled. Continue Diovan, Lopressor, Clonidine.   4. CAD: Stable. He is not on an ASA since he has been on coumadin. Continue statin, beta blocker. Recommend daily exercise.

## 2014-01-19 ENCOUNTER — Ambulatory Visit (INDEPENDENT_AMBULATORY_CARE_PROVIDER_SITE_OTHER): Payer: Medicare Other | Admitting: Dietician

## 2014-01-19 ENCOUNTER — Encounter: Payer: Self-pay | Admitting: Dietician

## 2014-01-19 VITALS — Wt 309.2 lb

## 2014-01-19 DIAGNOSIS — N529 Male erectile dysfunction, unspecified: Secondary | ICD-10-CM

## 2014-01-19 DIAGNOSIS — E1149 Type 2 diabetes mellitus with other diabetic neurological complication: Secondary | ICD-10-CM | POA: Diagnosis not present

## 2014-01-19 DIAGNOSIS — E1142 Type 2 diabetes mellitus with diabetic polyneuropathy: Secondary | ICD-10-CM | POA: Diagnosis not present

## 2014-01-19 DIAGNOSIS — E1140 Type 2 diabetes mellitus with diabetic neuropathy, unspecified: Secondary | ICD-10-CM

## 2014-01-19 DIAGNOSIS — N521 Erectile dysfunction due to diseases classified elsewhere: Secondary | ICD-10-CM

## 2014-01-19 NOTE — Progress Notes (Signed)
Medical Nutrition Therapy:  Appt start time: 1430 end time: 9935 .  Assessment:  Primary concerns today: Weight management.   Weight at office visit is 309# and blood sugar was 130 checked 2 hours after meal.   His main concern is difficulty breathing caused by his COPD which has restricted his activity.  He is likely in contemplative stage of change as he does not see any changes he can make to his food intake at this time to help him with his desired weight loss. He has had no previous meal planning or diabetes training.  Stated he will see his pulmonary doctor in June to work on breathing difficulties.  Patient has reported improvement on intake 8oz milk daily, decreasing amount of sweets in the past 3 weeks, but with no change in weight.  Patient also reported decreasing the amount of sodium intake and has tried the no salt alternative, but does not like it. 24-hr recall: (Up at  5AM, bedtime is 11PM -12am B: none   L (12) Bologna (1) and Salami (2) sandwich D ( 5PM)- salad (tomatoes, cucumbers, lettuce, and [address] dressing), steak (3.5oz), 8oz juice  Snk ( PM)- none Usual physical activity includes piddles in yard several hours a day when weather permits then watches TV ~ 4-8+ hours/day, patient reports weight gain has happened each time his activity has been restricted and is currently being restricted by his difficulty breathing.  Progress Towards Goal(s):  Minimal progress.   Nutritional Diagnosis:  NB-1.1 Food and nutrition-related knowledge deficit As related to lack of prior exposure to nutation/weight loss or diabetes education deferred due to change in nutrition diagnosis.   NB 1.3 Not ready for diet/lifestyle change as related to competing values (breathing difficulty with activity)  As evidenced by his lack of desire to work on nutrition intake or activity until breathing issues resolved. .    Intervention:  Follow up by phone to offer health coaching.    Monitoring/Evaluation:  Dietary intake, exercise, and body weight in 3 week(s).

## 2014-02-04 ENCOUNTER — Other Ambulatory Visit: Payer: Self-pay | Admitting: *Deleted

## 2014-02-04 DIAGNOSIS — H2510 Age-related nuclear cataract, unspecified eye: Secondary | ICD-10-CM | POA: Diagnosis not present

## 2014-02-04 DIAGNOSIS — E119 Type 2 diabetes mellitus without complications: Secondary | ICD-10-CM | POA: Diagnosis not present

## 2014-02-04 DIAGNOSIS — I4891 Unspecified atrial fibrillation: Secondary | ICD-10-CM

## 2014-02-04 DIAGNOSIS — H4011X0 Primary open-angle glaucoma, stage unspecified: Secondary | ICD-10-CM | POA: Diagnosis not present

## 2014-02-04 DIAGNOSIS — J449 Chronic obstructive pulmonary disease, unspecified: Secondary | ICD-10-CM

## 2014-02-04 NOTE — Telephone Encounter (Signed)
Medication list reveals Atrovent from outside provider.  If he is on the Atrovent, a refill of the spiriva would be inappropriate.  I tried calling his mobile number and received a voice mail.  It was identified as Mr. Mealy and I left a message that I was calling about a refill and that I would try his work number.  I called the work number and received an unidentified Designer, television/film set.  No message was left.  I will try calling Mr. Kueker tomorrow to try to clarify this issue before renewing the spiriva.

## 2014-02-04 NOTE — Telephone Encounter (Signed)
Requesting 90 days supply 

## 2014-02-05 NOTE — Telephone Encounter (Signed)
Called cell phone again to clarify the Atrovent/spiriva issue and reached an identified voice mail.  Left a message that I was calling about a refill question and that I would call him back next week.

## 2014-02-05 NOTE — Telephone Encounter (Signed)
States he has to have 90 day supplies on his medications. Thanks

## 2014-02-06 MED ORDER — WARFARIN SODIUM 5 MG PO TABS: ORAL_TABLET | ORAL | Status: DC

## 2014-02-06 NOTE — Telephone Encounter (Signed)
Warfarin refilled.  Please ask Juan Calderon if he is taking atrovent and Spiriva the next time he calls.  I will not refill the Spiriva until this issue has been clarified.  I called him again today and reached the same voice mail.  I asked him to call into the clinic, since I could not reach him despite several attempts.  When he calls, please clarify if he is taking the Atrovent, Spiriva, both, or neither so that I may complete the refill request.  Thanks.

## 2014-02-08 ENCOUNTER — Ambulatory Visit (INDEPENDENT_AMBULATORY_CARE_PROVIDER_SITE_OTHER): Payer: Medicare Other | Admitting: Pharmacist

## 2014-02-08 DIAGNOSIS — I4891 Unspecified atrial fibrillation: Secondary | ICD-10-CM

## 2014-02-08 DIAGNOSIS — Z5181 Encounter for therapeutic drug level monitoring: Secondary | ICD-10-CM | POA: Diagnosis not present

## 2014-02-08 DIAGNOSIS — Z7901 Long term (current) use of anticoagulants: Secondary | ICD-10-CM | POA: Diagnosis not present

## 2014-02-08 LAB — POCT INR: INR: 2.9

## 2014-02-09 NOTE — Telephone Encounter (Signed)
When I talked to pt on Friday, he said he will talk to u about his medications at his appt on Thursday 6/11.

## 2014-02-10 NOTE — Telephone Encounter (Signed)
Great.  I will discontinue this prescription and address the issue with him in person on 02/11/2014.  Thanks.

## 2014-02-11 ENCOUNTER — Encounter: Payer: Self-pay | Admitting: Internal Medicine

## 2014-02-11 ENCOUNTER — Ambulatory Visit (INDEPENDENT_AMBULATORY_CARE_PROVIDER_SITE_OTHER): Payer: Medicare Other | Admitting: Internal Medicine

## 2014-02-11 VITALS — BP 128/80 | HR 71 | Temp 97.2°F | Wt 312.7 lb

## 2014-02-11 DIAGNOSIS — E1140 Type 2 diabetes mellitus with diabetic neuropathy, unspecified: Secondary | ICD-10-CM

## 2014-02-11 DIAGNOSIS — I5032 Chronic diastolic (congestive) heart failure: Secondary | ICD-10-CM | POA: Diagnosis not present

## 2014-02-11 DIAGNOSIS — J45909 Unspecified asthma, uncomplicated: Secondary | ICD-10-CM | POA: Diagnosis not present

## 2014-02-11 DIAGNOSIS — H409 Unspecified glaucoma: Secondary | ICD-10-CM | POA: Diagnosis not present

## 2014-02-11 DIAGNOSIS — N521 Erectile dysfunction due to diseases classified elsewhere: Secondary | ICD-10-CM

## 2014-02-11 DIAGNOSIS — I2510 Atherosclerotic heart disease of native coronary artery without angina pectoris: Secondary | ICD-10-CM | POA: Diagnosis not present

## 2014-02-11 DIAGNOSIS — M109 Gout, unspecified: Secondary | ICD-10-CM | POA: Diagnosis not present

## 2014-02-11 DIAGNOSIS — Z6841 Body Mass Index (BMI) 40.0 and over, adult: Secondary | ICD-10-CM

## 2014-02-11 DIAGNOSIS — K219 Gastro-esophageal reflux disease without esophagitis: Secondary | ICD-10-CM | POA: Diagnosis not present

## 2014-02-11 DIAGNOSIS — J449 Chronic obstructive pulmonary disease, unspecified: Secondary | ICD-10-CM

## 2014-02-11 DIAGNOSIS — H4010X0 Unspecified open-angle glaucoma, stage unspecified: Secondary | ICD-10-CM | POA: Diagnosis not present

## 2014-02-11 DIAGNOSIS — I10 Essential (primary) hypertension: Secondary | ICD-10-CM

## 2014-02-11 DIAGNOSIS — IMO0002 Reserved for concepts with insufficient information to code with codable children: Secondary | ICD-10-CM | POA: Diagnosis not present

## 2014-02-11 DIAGNOSIS — M47812 Spondylosis without myelopathy or radiculopathy, cervical region: Secondary | ICD-10-CM | POA: Diagnosis not present

## 2014-02-11 DIAGNOSIS — I4891 Unspecified atrial fibrillation: Secondary | ICD-10-CM

## 2014-02-11 DIAGNOSIS — E785 Hyperlipidemia, unspecified: Secondary | ICD-10-CM | POA: Diagnosis not present

## 2014-02-11 DIAGNOSIS — N529 Male erectile dysfunction, unspecified: Secondary | ICD-10-CM | POA: Diagnosis not present

## 2014-02-11 DIAGNOSIS — G894 Chronic pain syndrome: Secondary | ICD-10-CM

## 2014-02-11 DIAGNOSIS — E1142 Type 2 diabetes mellitus with diabetic polyneuropathy: Secondary | ICD-10-CM | POA: Diagnosis not present

## 2014-02-11 DIAGNOSIS — M1711 Unilateral primary osteoarthritis, right knee: Secondary | ICD-10-CM

## 2014-02-11 DIAGNOSIS — E1149 Type 2 diabetes mellitus with other diabetic neurological complication: Secondary | ICD-10-CM | POA: Diagnosis not present

## 2014-02-11 LAB — LIPID PANEL
CHOL/HDL RATIO: 2.3 ratio
Cholesterol: 101 mg/dL (ref 0–200)
HDL: 44 mg/dL (ref >39)
LDL CALC: 26 mg/dL (ref 0–99)
Triglycerides: 154 mg/dL — ABNORMAL HIGH (ref <150)
VLDL: 31 mg/dL (ref 0–40)

## 2014-02-11 LAB — POCT GLYCOSYLATED HEMOGLOBIN (HGB A1C): Hemoglobin A1C: 7.4

## 2014-02-11 LAB — BASIC METABOLIC PANEL WITH GFR
BUN: 29 mg/dL — ABNORMAL HIGH (ref 6–23)
CHLORIDE: 103 meq/L (ref 96–112)
CO2: 29 meq/L (ref 19–32)
Calcium: 8.9 mg/dL (ref 8.4–10.5)
Creat: 1.35 mg/dL (ref 0.50–1.35)
GFR, Est African American: 66 mL/min
GFR, Est Non African American: 57 mL/min — ABNORMAL LOW
GLUCOSE: 183 mg/dL — AB (ref 70–99)
Potassium: 4.7 mEq/L (ref 3.5–5.3)
Sodium: 140 mEq/L (ref 135–145)

## 2014-02-11 LAB — GLUCOSE, CAPILLARY: Glucose-Capillary: 167 mg/dL — ABNORMAL HIGH (ref 70–99)

## 2014-02-11 MED ORDER — FENOFIBRATE 160 MG PO TABS: 160.0000 mg | ORAL_TABLET | Freq: Every day | ORAL | Status: DC

## 2014-02-11 MED ORDER — OXYCODONE-ACETAMINOPHEN 10-325 MG PO TABS: 1.0000 | ORAL_TABLET | Freq: Four times a day (QID) | ORAL | Status: DC | PRN

## 2014-02-11 MED ORDER — DICLOFENAC SODIUM 50 MG PO TBEC: 50.0000 mg | DELAYED_RELEASE_TABLET | Freq: Two times a day (BID) | ORAL | Status: DC

## 2014-02-11 MED ORDER — FUROSEMIDE 20 MG PO TABS: 20.0000 mg | ORAL_TABLET | Freq: Every day | ORAL | Status: DC

## 2014-02-11 MED ORDER — FUROSEMIDE 40 MG PO TABS: 40.0000 mg | ORAL_TABLET | Freq: Every day | ORAL | Status: DC

## 2014-02-11 MED ORDER — BUDESONIDE-FORMOTEROL FUMARATE 160-4.5 MCG/ACT IN AERO
2.0000 | INHALATION_SPRAY | Freq: Two times a day (BID) | RESPIRATORY_TRACT | Status: DC
Start: 2014-02-11 — End: 2014-08-13

## 2014-02-11 MED ORDER — OXYCODONE-ACETAMINOPHEN 10-325 MG PO TABS
1.0000 | ORAL_TABLET | Freq: Four times a day (QID) | ORAL | Status: DC | PRN
Start: 2014-02-11 — End: 2014-05-28

## 2014-02-11 MED ORDER — ESOMEPRAZOLE MAGNESIUM 40 MG PO CPDR: 40.0000 mg | DELAYED_RELEASE_CAPSULE | Freq: Every day | ORAL | Status: DC

## 2014-02-11 MED ORDER — ALLOPURINOL 300 MG PO TABS: 300.0000 mg | ORAL_TABLET | Freq: Every day | ORAL | Status: DC

## 2014-02-11 MED ORDER — OXYCODONE-ACETAMINOPHEN 10-325 MG PO TABS
1.0000 | ORAL_TABLET | Freq: Four times a day (QID) | ORAL | Status: DC | PRN
Start: 2014-02-11 — End: 2014-02-11

## 2014-02-11 MED ORDER — LATANOPROST 0.005 % OP SOLN: 1.0000 [drp] | Freq: Every day | OPHTHALMIC | Status: DC

## 2014-02-11 NOTE — Patient Instructions (Addendum)
It was good to see you again.  I am sorry your breathing is not any better.  1) Stop the digoxin.  I am not sure how this got restarted as we had previously stopped it.  2) We started symbicort 2 puffs twice daily for your breathing.  Keep taking the albuterol as needed.  Stop the atrovent/spiriva.  3) Keep taking all of your other medications as you are.  4) I will call you if any of your lab work is concerning.  5) Keep working on your diabetes.  Remaining active (with your grandson) and eating less should help.  I will see you back in 3 months, sooner if necessary.

## 2014-02-12 ENCOUNTER — Ambulatory Visit: Payer: Medicare Other | Admitting: Internal Medicine

## 2014-02-12 NOTE — Assessment & Plan Note (Signed)
Symptoms well controlled on nexium 40 mg daily.  We will continue current dose.

## 2014-02-12 NOTE — Assessment & Plan Note (Addendum)
Hgb A1C is essentially unchanged at 7.4 today.  Nutritional counseling was ineffective as he was still contemplative about necessary changes in diet and lifestyle.  None-the-less, he would like some more time to allow for diet control of his diabetes, before starting pharmacotherapy.  We will therefore recehck the Hgb A1C in three months to reassess control.  He is otherwise up to date on his diabetic health maintenance.  He could not afford the Viagra, so he is unable to comment on its efficacy.  He read about another medication for ED but could not remember it during the appointment.  He was asked to call it in and I would look into it.

## 2014-02-12 NOTE — Assessment & Plan Note (Signed)
He has had no gout flares on the allopurinol 300 mg daily alone.  We will continue this dose as his uric acid is less 6.0.

## 2014-02-12 NOTE — Assessment & Plan Note (Signed)
He is currently up to date on his health care maintence.

## 2014-02-12 NOTE — Assessment & Plan Note (Signed)
He has had no chest pain requiring SL NTG.  Will continue the metoprolol, ASA, and statin therapy and aggressively control the cardiac risk factors that are modifiable.

## 2014-02-12 NOTE — Progress Notes (Signed)
   Subjective:    Patient ID: Juan Calderon, male    DOB: [birth year], 58 y.o.   MRN: [id number]  HPI  Please see the A&P for the status of the pt's chronic medical problems.  Review of Systems  Constitutional: Negative for activity change, appetite change and unexpected weight change.  Respiratory: Positive for shortness of breath and wheezing. Negative for chest tightness.   Cardiovascular: Positive for leg swelling. Negative for chest pain and palpitations.  Gastrointestinal: Positive for constipation. Negative for nausea, vomiting, abdominal pain and diarrhea.  Musculoskeletal: Positive for arthralgias.  Skin: Negative for rash.  Allergic/Immunologic: Positive for environmental allergies.  Neurological: Negative for dizziness, seizures, syncope, weakness and light-headedness.      Objective:   Physical Exam  Nursing note and vitals reviewed. Constitutional: He is oriented to person, place, and time. He appears well-developed and well-nourished. No distress.  HENT:  Head: Normocephalic and atraumatic.  Eyes: Conjunctivae are normal. Right eye exhibits no discharge. Left eye exhibits no discharge. No scleral icterus.  Cardiovascular: Normal rate, regular rhythm and normal heart sounds.  Exam reveals no gallop and no friction rub.   No murmur heard. Pulmonary/Chest: Effort normal and breath sounds normal. No respiratory distress. He has no wheezes. He has no rales.  Abdominal: Soft. Bowel sounds are normal. He exhibits no distension. There is no tenderness. There is no rebound and no guarding.  Musculoskeletal: Normal range of motion. He exhibits edema. He exhibits no tenderness.  Neurological: He is alert and oriented to person, place, and time. He exhibits normal muscle tone.  Skin: Skin is warm and dry. No rash noted. He is not diaphoretic. No erythema.  Psychiatric: He has a normal mood and affect. His behavior is normal. Judgment and thought content normal.      Assessment &  Plan:   Please see problem oriented charting.

## 2014-02-12 NOTE — Assessment & Plan Note (Signed)
Latanoprost was renewed.

## 2014-02-12 NOTE — Assessment & Plan Note (Signed)
His blood pressure today was 128/80 which is at target on the increased dose of the valsartan.  We will therefore continue the valsartan at 160 mg daily, metoprolol 100 mg twice daily, and terazosin 5 mg each night.

## 2014-02-12 NOTE — Assessment & Plan Note (Signed)
We discussed the link between obesity, diabetes, HTN, and knee osteoarthritis.  Nutrition was discussed, specifically the need to decrease caloric intake relative to caloric expenditures.

## 2014-02-12 NOTE — Assessment & Plan Note (Signed)
Reasonably well controlled on the percocet.  We will therefore continue #120/month.  For ease, I am OK with him taking 3 months of prescriptions to the pharmacy so he does not need to come to clinic to pick them up each month.

## 2014-02-12 NOTE — Assessment & Plan Note (Signed)
Reasonably well controlled on the diclofenac.  We will therefore continue at the current dose.

## 2014-02-12 NOTE — Assessment & Plan Note (Signed)
Digoxin had been previously discontinued, but I believe it was inadvertently restarted by Pharmacy.  As I do not believe this medication is necessary for rate control, it will be stopped once again.  We will continue the metoprolol for rate control and coumadin for anticoagulation.

## 2014-02-12 NOTE — Assessment & Plan Note (Signed)
Afterload is well controlled.  If he remains dyspneic and orthopneic despite the change to symbicort for presumed asthmatic bronchitis, we will increase the lasix dose at the follow-up visit.

## 2014-02-12 NOTE — Assessment & Plan Note (Signed)
LDL is 26, which may be too low.  Will decrease the atorvastatin to 20 mg daily.

## 2014-02-12 NOTE — Assessment & Plan Note (Signed)
He continues to note shortness of breath and orthopnea without paroxysmal nocturnal dyspnea.  This is despite the spiriva and PRN albuterol.  I am concerned he may not have COPD since he never smoked, but may have asthmatic bronchitis.  We decided to discontinue the spiriva and start symbicort 2 puffs BID with continued albuterol.  If after 3 months of continuous use he notes continued difficulties with breathing, we will consider a decompensation of his cardiomyopathy that may be more symptomatic than overtly clinical on examination, especially with the orthopnea.

## 2014-02-24 MED ORDER — ATORVASTATIN CALCIUM 20 MG PO TABS: 20.0000 mg | ORAL_TABLET | Freq: Every day | ORAL | Status: DC

## 2014-02-24 NOTE — Addendum Note (Signed)
Addended by: Oval Linsey D on: 02/24/2014 12:10 PM   Modules accepted: Orders

## 2014-02-24 NOTE — Progress Notes (Signed)
I spoke with Juan Calderon today about the low LDL and he was in agreement to lower the atorvastatin dose to 20 mg daily.  The prescription was changed to reflect this update.

## 2014-03-08 ENCOUNTER — Encounter (HOSPITAL_COMMUNITY): Payer: Self-pay | Admitting: Emergency Medicine

## 2014-03-08 ENCOUNTER — Emergency Department (HOSPITAL_COMMUNITY)
Admission: EM | Admit: 2014-03-08 | Discharge: 2014-03-08 | Disposition: A | Discharge status: Home / Self Care | Payer: Medicare Other | Attending: Emergency Medicine | Admitting: Emergency Medicine

## 2014-03-08 ENCOUNTER — Emergency Department (HOSPITAL_COMMUNITY): Payer: Medicare Other

## 2014-03-08 DIAGNOSIS — M1710 Unilateral primary osteoarthritis, unspecified knee: Secondary | ICD-10-CM | POA: Insufficient documentation

## 2014-03-08 DIAGNOSIS — I5032 Chronic diastolic (congestive) heart failure: Secondary | ICD-10-CM | POA: Diagnosis not present

## 2014-03-08 DIAGNOSIS — K219 Gastro-esophageal reflux disease without esophagitis: Secondary | ICD-10-CM | POA: Diagnosis not present

## 2014-03-08 DIAGNOSIS — I2510 Atherosclerotic heart disease of native coronary artery without angina pectoris: Secondary | ICD-10-CM | POA: Insufficient documentation

## 2014-03-08 DIAGNOSIS — J449 Chronic obstructive pulmonary disease, unspecified: Secondary | ICD-10-CM | POA: Diagnosis not present

## 2014-03-08 DIAGNOSIS — N529 Male erectile dysfunction, unspecified: Secondary | ICD-10-CM | POA: Diagnosis not present

## 2014-03-08 DIAGNOSIS — E785 Hyperlipidemia, unspecified: Secondary | ICD-10-CM | POA: Insufficient documentation

## 2014-03-08 DIAGNOSIS — G8929 Other chronic pain: Secondary | ICD-10-CM | POA: Diagnosis not present

## 2014-03-08 DIAGNOSIS — H409 Unspecified glaucoma: Secondary | ICD-10-CM | POA: Insufficient documentation

## 2014-03-08 DIAGNOSIS — Z8619 Personal history of other infectious and parasitic diseases: Secondary | ICD-10-CM | POA: Diagnosis not present

## 2014-03-08 DIAGNOSIS — I4891 Unspecified atrial fibrillation: Secondary | ICD-10-CM | POA: Diagnosis not present

## 2014-03-08 DIAGNOSIS — Z79899 Other long term (current) drug therapy: Secondary | ICD-10-CM | POA: Diagnosis not present

## 2014-03-08 DIAGNOSIS — M109 Gout, unspecified: Secondary | ICD-10-CM | POA: Diagnosis not present

## 2014-03-08 DIAGNOSIS — M47812 Spondylosis without myelopathy or radiculopathy, cervical region: Secondary | ICD-10-CM | POA: Insufficient documentation

## 2014-03-08 DIAGNOSIS — E1149 Type 2 diabetes mellitus with other diabetic neurological complication: Secondary | ICD-10-CM | POA: Insufficient documentation

## 2014-03-08 DIAGNOSIS — J4489 Other specified chronic obstructive pulmonary disease: Secondary | ICD-10-CM | POA: Insufficient documentation

## 2014-03-08 DIAGNOSIS — Z7901 Long term (current) use of anticoagulants: Secondary | ICD-10-CM | POA: Insufficient documentation

## 2014-03-08 DIAGNOSIS — M25562 Pain in left knee: Secondary | ICD-10-CM

## 2014-03-08 DIAGNOSIS — M79609 Pain in unspecified limb: Secondary | ICD-10-CM | POA: Diagnosis present

## 2014-03-08 DIAGNOSIS — I10 Essential (primary) hypertension: Secondary | ICD-10-CM | POA: Insufficient documentation

## 2014-03-08 DIAGNOSIS — G478 Other sleep disorders: Secondary | ICD-10-CM | POA: Insufficient documentation

## 2014-03-08 DIAGNOSIS — G894 Chronic pain syndrome: Secondary | ICD-10-CM | POA: Diagnosis not present

## 2014-03-08 DIAGNOSIS — Z6841 Body Mass Index (BMI) 40.0 and over, adult: Secondary | ICD-10-CM | POA: Insufficient documentation

## 2014-03-08 DIAGNOSIS — M25569 Pain in unspecified knee: Secondary | ICD-10-CM | POA: Insufficient documentation

## 2014-03-08 DIAGNOSIS — IMO0002 Reserved for concepts with insufficient information to code with codable children: Secondary | ICD-10-CM | POA: Diagnosis not present

## 2014-03-08 DIAGNOSIS — G4733 Obstructive sleep apnea (adult) (pediatric): Secondary | ICD-10-CM | POA: Diagnosis not present

## 2014-03-08 DIAGNOSIS — Z872 Personal history of diseases of the skin and subcutaneous tissue: Secondary | ICD-10-CM | POA: Diagnosis not present

## 2014-03-08 DIAGNOSIS — H4010X0 Unspecified open-angle glaucoma, stage unspecified: Secondary | ICD-10-CM | POA: Insufficient documentation

## 2014-03-08 DIAGNOSIS — E1142 Type 2 diabetes mellitus with diabetic polyneuropathy: Secondary | ICD-10-CM | POA: Diagnosis not present

## 2014-03-08 MED ORDER — HYDROMORPHONE HCL PF 1 MG/ML IJ SOLN
1.0000 mg | Freq: Once | INTRAMUSCULAR | Status: AC
  Administered 2014-03-08: 1 mg via INTRAMUSCULAR
  Filled 2014-03-08: qty 1

## 2014-03-08 NOTE — Discharge Instructions (Signed)
Arthralgia °Your caregiver has diagnosed you as suffering from an arthralgia. Arthralgia means there is pain in a joint. This can come from many reasons including: °· Bruising the joint which causes soreness (inflammation) in the joint. °· Wear and tear on the joints which occur as we grow older (osteoarthritis). °· Overusing the joint. °· Various forms of arthritis. °· Infections of the joint. °Regardless of the cause of pain in your joint, most of these different pains respond to anti-inflammatory drugs and rest. The exception to this is when a joint is infected, and these cases are treated with antibiotics, if it is a bacterial infection. °HOME CARE INSTRUCTIONS  °· Rest the injured area for as long as directed by your caregiver. Then slowly start using the joint as directed by your caregiver and as the pain allows. Crutches as directed may be useful if the ankles, knees or hips are involved. If the knee was splinted or casted, continue use and care as directed. If an stretchy or elastic wrapping bandage has been applied today, it should be removed and re-applied every 3 to 4 hours. It should not be applied tightly, but firmly enough to keep swelling down. Watch toes and feet for swelling, bluish discoloration, coldness, numbness or excessive pain. If any of these problems (symptoms) occur, remove the ace bandage and re-apply more loosely. If these symptoms persist, contact your caregiver or return to this location. °· For the first 24 hours, keep the injured extremity elevated on pillows while lying down. °· Apply ice for 15-20 minutes to the sore joint every couple hours while awake for the first half day. Then 03-04 times per day for the first 48 hours. Put the ice in a plastic bag and place a towel between the bag of ice and your skin. °· Wear any splinting, casting, elastic bandage applications, or slings as instructed. °· Only take over-the-counter or prescription medicines for pain, discomfort, or fever as  directed by your caregiver. Do not use aspirin immediately after the injury unless instructed by your physician. Aspirin can cause increased bleeding and bruising of the tissues. °· If you were given crutches, continue to use them as instructed and do not resume weight bearing on the sore joint until instructed. °Persistent pain and inability to use the sore joint as directed for more than 2 to 3 days are warning signs indicating that you should see a caregiver for a follow-up visit as soon as possible. Initially, a hairline fracture (break in bone) may not be evident on X-rays. Persistent pain and swelling indicate that further evaluation, non-weight bearing or use of the joint (use of crutches or slings as instructed), or further X-rays are indicated. X-rays may sometimes not show a small fracture until a week or 10 days later. Make a follow-up appointment with your own caregiver or one to whom we have referred you. A radiologist (specialist in reading X-rays) may read your X-rays. Make sure you know how you are to obtain your X-ray results. Do not assume everything is normal if you do not hear from us. °SEEK MEDICAL CARE IF: °Bruising, swelling, or pain increases. °SEEK IMMEDIATE MEDICAL CARE IF:  °· Your fingers or toes are numb or blue. °· The pain is not responding to medications and continues to stay the same or get worse. °· The pain in your joint becomes severe. °· You develop a fever over 102° F (38.9° C). °· It becomes impossible to move or use the joint. °MAKE SURE YOU:  °·   Understand these instructions.  Will watch your condition.  Will get help right away if you are not doing well or get worse. Document Released: 08/20/2005 Document Revised: 11/12/2011 Document Reviewed: 04/07/2008 [facility] Patient Information 2015 [place], [place]. This information is not intended to replace advice given to you by your health care provider. Make sure you discuss any questions you have with your health care  provider.  Chronic Pain Chronic pain can be defined as pain that is off and on and lasts for 3-6 months or longer. Many things cause chronic pain, which can make it difficult to make a diagnosis. There are many treatment options available for chronic pain. However, finding a treatment that works well for you may require trying various approaches until the right one is found. Many people benefit from a combination of two or more types of treatment to control their pain. SYMPTOMS  Chronic pain can occur anywhere in the body and can range from mild to very severe. Some types of chronic pain include:  Headache.  Low back pain.  Cancer pain.  Arthritis pain.  Neurogenic pain. This is pain resulting from damage to nerves. People with chronic pain may also have other symptoms such as:  Depression.  Anger.  Insomnia.  Anxiety. DIAGNOSIS  Your health care provider will help diagnose your condition over time. In many cases, the initial focus will be on excluding possible conditions that could be causing the pain. Depending on your symptoms, your health care provider may order tests to diagnose your condition. Some of these tests may include:   Blood tests.   CT scan.   MRI.   X-rays.   Ultrasounds.   Nerve conduction studies.  You may need to see a specialist.  TREATMENT  Finding treatment that works well may take time. You may be referred to a pain specialist. He or she may prescribe medicine or therapies, such as:   Mindful meditation or yoga.  Shots (injections) of numbing or pain-relieving medicines into the spine or area of pain.  Local electrical stimulation.  Acupuncture.   Massage therapy.   Aroma, color, light, or sound therapy.   Biofeedback.   Working with a physical therapist to keep from getting stiff.   Regular, gentle exercise.   Cognitive or behavioral therapy.   Group support.  Sometimes, surgery may be recommended.  HOME CARE  INSTRUCTIONS   Take all medicines as directed by your health care provider.   Lessen stress in your life by relaxing and doing things such as listening to calming music.   Exercise or be active as directed by your health care provider.   Eat a healthy diet and include things such as vegetables, fruits, fish, and lean meats in your diet.   Keep all follow-up appointments with your health care provider.   Attend a support group with others suffering from chronic pain. SEEK MEDICAL CARE IF:   Your pain gets worse.   You develop a new pain that was not there before.   You cannot tolerate medicines given to you by your health care provider.   You have new symptoms since your last visit with your health care provider.  SEEK IMMEDIATE MEDICAL CARE IF:   You feel weak.   You have decreased sensation or numbness.   You lose control of bowel or bladder function.   Your pain suddenly gets much worse.   You develop shaking.  You develop chills.  You develop confusion.  You develop chest pain.  You develop shortness of breath.  MAKE SURE YOU:  Understand these instructions.  Will watch your condition.  Will get help right away if you are not doing well or get worse. Document Released: 05/12/2002 Document Revised: 04/22/2013 Document Reviewed: 02/13/2013 [facility] Patient Information 2015 [place], [place]. This information is not intended to replace advice given to you by your health care provider. Make sure you discuss any questions you have with your health care provider.

## 2014-03-08 NOTE — ED Notes (Addendum)
Presents with left leg pain and swelling. Pain is from knee to upper thigh and is constant began 4 days ago. Pain is worse with movement. Nothing makes pain better. Left lower leg swelling noted. CMS intact. Denies numbness and tingling. Denies injury.  P;t has swelling to lower left extremity, reports that it is always swollen.

## 2014-03-08 NOTE — ED Provider Notes (Signed)
CSN: [id number]     Arrival date & time 03/08/14  2046 History  This chart was scribed for non-physician practitioner, Montine Circle, PA-C working with Dorie Rank, MD by Einar Pheasant, ED scribe. This patient was seen in room TR06C/TR06C and the patient's care was started at 10:00 PM.     Chief Complaint  Patient presents with  . Leg Pain   The history is provided by the patient. No language interpreter was used.   HPI Comments: Juan Calderon is a 58 y.o. male who presents to the Emergency Department with a chief complaint worsening left leg pain that started approximately 4 days ago. Pt states that in 1979 he suffered a major injury to his left leg. He states that ever since the incident he's been experiencing choronic pain. Pt reports taking Percocet to alleviate his pain, however, the pills have not been working these past 4 days. His last dose was this morning at 9AM. He states that the pain has been causing him to experience sleep disturbance. Pt states that in the past he had similar symptoms with his right leg and was treated with a steroid shot by a local orthopeadic specialist. Denies any fever, chills, nausea, emesis, SOB, numbness or chest pain.   Past Medical History  Diagnosis Date  . Essential hypertension 09/20/2011  . Atrial fibrillation 02/04/2012  . Chronic diastolic heart failure 0/05/3817    with mild left ventricular hypertrophy on Echo 02/2010  . Chronic pain syndrome 01/15/2013    Left arm and leg s/p traumatic injury   . Osteoarthritis cervical spine 04/25/2013  . Gastroesophageal reflux disease 04/25/2013  . Open-angle glaucoma 04/25/2013  . 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 04/25/2013  . Hyperlipidemia LDL goal < 100 04/25/2013  . Coronary artery disease 04/25/2013    25% LAD stenosis on cath 2007.  Stable angina.  . Asthma, chronic 04/25/2013    Clinical diagnosis  . Gout 04/25/2013  . Right rotator cuff tear 04/25/2013    Large  full-thickness tear of the supraspinatus with mild retraction but no atrophy   . Vasomotor rhinitis 04/25/2013  . Obstructive sleep apnea 06/01/2013    Moderate, AHI 29.8 per hour with moderately loud snoring and oxygen desaturation to a nadir of 79%. CPAP titration resulted in a prescription for 17 CWP.    [name] Ulcer     History of ulcer in the remote past  . Cataract     Left eye  . Onychomycosis of toenail 12/24/2012    Left great toe   . Morbid obesity with BMI of 40.0-44.9, adult 04/25/2013  . Osteoarthritis of right knee 06/19/2013  . Blood transfusion without reported diagnosis   . Chronic obstructive pulmonary disease 04/25/2013  . Diverticulosis 11/12/2013   Past Surgical History  Procedure Laterality Date  . Left leg surgery    . Left arm surgery    . Shoulder surgery      Right  . Fracture surgery Left 1980's    Elbow   Family History  Problem Relation Age of Onset  . Heart failure Mother   . Alzheimer's disease Father   . Healthy Sister   . Healthy Brother   . Healthy Son   . Healthy Sister   . Healthy Sister   . Healthy Sister   . Healthy Sister   . Healthy Brother   . Healthy Brother   . Healthy Brother   . Heart failure Brother   . Healthy Brother   .  Osteoarthritis Brother   . Prostate cancer Brother   . Early death Brother     Gun Shot Wound   History  Substance Use Topics  . Smoking status: Never Smoker   . Smokeless tobacco: Never Used  . Alcohol Use: 1.0 oz/week    2 drink(s) per week     Comment: Liquor.    Review of Systems  Constitutional: Negative for fever and chills.  HENT: Negative for congestion.   Respiratory: Negative for cough.   Cardiovascular: Negative for chest pain.  Gastrointestinal: Negative for nausea, vomiting and abdominal pain.  Musculoskeletal: Positive for arthralgias.  Neurological: Negative for numbness.  Psychiatric/Behavioral: Positive for sleep disturbance.      Allergies  Ramipril and Testosterone  Home  Medications   Prior to Admission medications   Medication Sig Start Date End Date Taking? Authorizing Provider  albuterol (PROAIR HFA) 108 (90 BASE) MCG/ACT inhaler Inhale 2 puffs into the lungs every 6 (six) hours as needed for wheezing or shortness of breath.    Karren Cobble, MD  allopurinol (ZYLOPRIM) 300 MG tablet Take 1 tablet (300 mg total) by mouth daily. 02/11/14   Karren Cobble, MD  atorvastatin (LIPITOR) 20 MG tablet Take 1 tablet (20 mg total) by mouth daily. 02/24/14   Karren Cobble, MD  budesonide-formoterol [facility]) 160-4.5 MCG/ACT inhaler Inhale 2 puffs into the lungs 2 (two) times daily. 02/11/14   Karren Cobble, MD  diclofenac (VOLTAREN) 50 MG EC tablet Take 1 tablet (50 mg total) by mouth 2 (two) times daily. 02/11/14   Karren Cobble, MD  esomeprazole (NEXIUM) 40 MG capsule Take 1 capsule (40 mg total) by mouth daily at 12 noon. 02/11/14   Karren Cobble, MD  fenofibrate 160 MG tablet Take 1 tablet (160 mg total) by mouth daily. 02/11/14   Karren Cobble, MD  furosemide (LASIX) 20 MG tablet Take 1 tablet (20 mg total) by mouth daily. 02/11/14 02/11/15  Karren Cobble, MD  furosemide (LASIX) 40 MG tablet Take 1 tablet (40 mg total) by mouth daily. 02/11/14   Karren Cobble, MD  latanoprost (XALATAN) 0.005 % ophthalmic solution Place 1 drop into both eyes at bedtime. 02/11/14   Karren Cobble, MD  loratadine (CLARITIN) 10 MG tablet Take 1 tablet (10 mg total) by mouth daily as needed for allergies.    Karren Cobble, MD  metoprolol (LOPRESSOR) 100 MG tablet Take 1 tablet (100 mg total) by mouth 2 (two) times daily. 08/08/13   Karren Cobble, MD  nitroGLYCERIN (NITROSTAT) 0.4 MG SL tablet Place 1 tablet (0.4 mg total) under the tongue every 5 (five) minutes as needed. 06/19/13   Karren Cobble, MD  oxyCODONE-acetaminophen (PERCOCET) 10-325 MG per tablet Take 1 tablet by mouth every 6 (six) hours as needed for pain. 02/11/14 04/16/14  Karren Cobble, MD   sildenafil (VIAGRA) 100 MG tablet Take 1 tablet (100 mg total) by mouth as needed for erectile dysfunction. 11/12/13 11/12/14  Karren Cobble, MD  terazosin (HYTRIN) 5 MG capsule Take 1 capsule (5 mg total) by mouth at bedtime. 06/19/13   Karren Cobble, MD  valsartan (DIOVAN) 160 MG tablet Take 1 tablet (160 mg total) by mouth daily. 11/12/13   Karren Cobble, MD  warfarin (COUMADIN) 5 MG tablet One tablet (5 mg) by mouth daily except one-half tablet (2.5 mg) by mouth on Mondays, Wednesdays, and Fridays    Karren Cobble, MD   BP  164/92  Pulse 59  Temp(Src) 98 F (36.7 C) (Oral)  Resp 18  Ht 6' (1.829 m)  Wt 312 lb (141.522 kg)  BMI 42.31 kg/m2  SpO2 97%  Physical Exam  Nursing note and vitals reviewed. Constitutional: He is oriented to person, place, and time. He appears well-developed and well-nourished. No distress.  HENT:  Head: Normocephalic and atraumatic.  Eyes: Conjunctivae and EOM are normal.  Neck: Neck supple. No tracheal deviation present.  Cardiovascular: Normal rate.   Pulmonary/Chest: Effort normal. No respiratory distress.  Musculoskeletal: Normal range of motion.  Soft tissue of left lower extremity is mishapen and deformed from prior surgeries, no evidence of DVT, cellulitis, or other acute abnormality  Neurological: He is alert and oriented to person, place, and time.  Sensation intact  Skin: Skin is warm and dry.  Psychiatric: He has a normal mood and affect. His behavior is normal.    ED Course  Procedures (including critical care time)  DIAGNOSTIC STUDIES: Oxygen Saturation is 97% on RA, adequate by my interpretation.    COORDINATION OF CARE: 10:11 PM- Pt advised of plan for treatment and pt agrees. Will order X-ray of left leg.   Imaging Review Dg Knee Complete 4 Views Left  03/08/2014   CLINICAL DATA:  Anterior left knee pain for 4 days. No known injury.  EXAM: LEFT KNEE - COMPLETE 4+ VIEW  COMPARISON:  None.  FINDINGS: Degenerative changes in  the left knee with tricompartment narrowing and hypertrophic changes. No evidence of acute fracture or dislocation. No focal bone lesion or bone destruction. Old ununited ossicles adjacent to the lateral tibial plateau. No significant effusion.  IMPRESSION: Degenerative changes in the left knee. No acute fracture or dislocation appreciated.   Electronically Signed   By: Lucienne Capers M.D.   On: 03/08/2014 23:18    MDM   Final diagnoses:  Knee pain, chronic, left    Patient with chronic knee pain. States the takes Percocet, but is no longer working. He has an orthopedic appointment next week. Plain films are negative.  No evidence of DVT, cellulitis, or other acute problem.  I personally performed the services described in this documentation, which was scribed in my presence. The recorded information has been reviewed and is accurate.    Montine Circle, PA-C 03/09/14 0004

## 2014-03-09 ENCOUNTER — Encounter (HOSPITAL_COMMUNITY): Payer: Self-pay | Admitting: Emergency Medicine

## 2014-03-09 ENCOUNTER — Emergency Department (HOSPITAL_COMMUNITY)
Admission: EM | Admit: 2014-03-09 | Discharge: 2014-03-09 | Disposition: A | Discharge status: Home / Self Care | Payer: Medicare Other | Attending: Emergency Medicine | Admitting: Emergency Medicine

## 2014-03-09 DIAGNOSIS — H269 Unspecified cataract: Secondary | ICD-10-CM | POA: Diagnosis not present

## 2014-03-09 DIAGNOSIS — M79609 Pain in unspecified limb: Secondary | ICD-10-CM | POA: Diagnosis not present

## 2014-03-09 DIAGNOSIS — M25569 Pain in unspecified knee: Secondary | ICD-10-CM | POA: Diagnosis not present

## 2014-03-09 DIAGNOSIS — E785 Hyperlipidemia, unspecified: Secondary | ICD-10-CM | POA: Insufficient documentation

## 2014-03-09 DIAGNOSIS — Z9889 Other specified postprocedural states: Secondary | ICD-10-CM | POA: Insufficient documentation

## 2014-03-09 DIAGNOSIS — E1129 Type 2 diabetes mellitus with other diabetic kidney complication: Secondary | ICD-10-CM | POA: Insufficient documentation

## 2014-03-09 DIAGNOSIS — J449 Chronic obstructive pulmonary disease, unspecified: Secondary | ICD-10-CM | POA: Insufficient documentation

## 2014-03-09 DIAGNOSIS — G894 Chronic pain syndrome: Secondary | ICD-10-CM | POA: Diagnosis not present

## 2014-03-09 DIAGNOSIS — I2510 Atherosclerotic heart disease of native coronary artery without angina pectoris: Secondary | ICD-10-CM | POA: Insufficient documentation

## 2014-03-09 DIAGNOSIS — J4489 Other specified chronic obstructive pulmonary disease: Secondary | ICD-10-CM | POA: Diagnosis not present

## 2014-03-09 DIAGNOSIS — IMO0002 Reserved for concepts with insufficient information to code with codable children: Secondary | ICD-10-CM | POA: Insufficient documentation

## 2014-03-09 DIAGNOSIS — Z791 Long term (current) use of non-steroidal anti-inflammatories (NSAID): Secondary | ICD-10-CM | POA: Insufficient documentation

## 2014-03-09 DIAGNOSIS — Z87828 Personal history of other (healed) physical injury and trauma: Secondary | ICD-10-CM | POA: Diagnosis not present

## 2014-03-09 DIAGNOSIS — I10 Essential (primary) hypertension: Secondary | ICD-10-CM | POA: Diagnosis not present

## 2014-03-09 DIAGNOSIS — I5032 Chronic diastolic (congestive) heart failure: Secondary | ICD-10-CM | POA: Diagnosis not present

## 2014-03-09 DIAGNOSIS — Z79899 Other long term (current) drug therapy: Secondary | ICD-10-CM | POA: Insufficient documentation

## 2014-03-09 DIAGNOSIS — Z8619 Personal history of other infectious and parasitic diseases: Secondary | ICD-10-CM | POA: Diagnosis not present

## 2014-03-09 DIAGNOSIS — N058 Unspecified nephritic syndrome with other morphologic changes: Secondary | ICD-10-CM | POA: Diagnosis not present

## 2014-03-09 DIAGNOSIS — I4891 Unspecified atrial fibrillation: Secondary | ICD-10-CM | POA: Insufficient documentation

## 2014-03-09 DIAGNOSIS — R6889 Other general symptoms and signs: Secondary | ICD-10-CM | POA: Diagnosis not present

## 2014-03-09 DIAGNOSIS — N529 Male erectile dysfunction, unspecified: Secondary | ICD-10-CM | POA: Diagnosis not present

## 2014-03-09 DIAGNOSIS — Z872 Personal history of diseases of the skin and subcutaneous tissue: Secondary | ICD-10-CM | POA: Diagnosis not present

## 2014-03-09 DIAGNOSIS — M79605 Pain in left leg: Secondary | ICD-10-CM

## 2014-03-09 DIAGNOSIS — Z7901 Long term (current) use of anticoagulants: Secondary | ICD-10-CM | POA: Diagnosis not present

## 2014-03-09 DIAGNOSIS — M1710 Unilateral primary osteoarthritis, unspecified knee: Secondary | ICD-10-CM | POA: Diagnosis not present

## 2014-03-09 DIAGNOSIS — K219 Gastro-esophageal reflux disease without esophagitis: Secondary | ICD-10-CM | POA: Diagnosis not present

## 2014-03-09 LAB — BASIC METABOLIC PANEL
Anion gap: 14 (ref 5–15)
BUN: 22 mg/dL (ref 6–23)
CALCIUM: 9.3 mg/dL (ref 8.4–10.5)
CO2: 29 mEq/L (ref 19–32)
Chloride: 99 mEq/L (ref 96–112)
Creatinine, Ser: 1.11 mg/dL (ref 0.50–1.35)
GFR calc Af Amer: 82 mL/min — ABNORMAL LOW (ref >90)
GFR, EST NON AFRICAN AMERICAN: 71 mL/min — AB (ref >90)
GLUCOSE: 151 mg/dL — AB (ref 70–99)
Potassium: 4 mEq/L (ref 3.7–5.3)
Sodium: 142 mEq/L (ref 137–147)

## 2014-03-09 LAB — URINALYSIS, ROUTINE W REFLEX MICROSCOPIC
Bilirubin Urine: NEGATIVE
GLUCOSE, UA: 100 mg/dL — AB
Hgb urine dipstick: NEGATIVE
KETONES UR: NEGATIVE mg/dL
Leukocytes, UA: NEGATIVE
Nitrite: NEGATIVE
PROTEIN: NEGATIVE mg/dL
Specific Gravity, Urine: 1.02 (ref 1.005–1.030)
Urobilinogen, UA: 0.2 mg/dL (ref 0.0–1.0)
pH: 6 (ref 5.0–8.0)

## 2014-03-09 LAB — CBC WITH DIFFERENTIAL/PLATELET
Basophils Absolute: 0 10*3/uL (ref 0.0–0.1)
Basophils Relative: 0 % (ref 0–1)
Eosinophils Absolute: 0.1 10*3/uL (ref 0.0–0.7)
Eosinophils Relative: 1 % (ref 0–5)
HCT: 37 % — ABNORMAL LOW (ref 39.0–52.0)
HEMOGLOBIN: 11.9 g/dL — AB (ref 13.0–17.0)
Lymphocytes Relative: 17 % (ref 12–46)
Lymphs Abs: 1 10*3/uL (ref 0.7–4.0)
MCH: 29.9 pg (ref 26.0–34.0)
MCHC: 32.2 g/dL (ref 30.0–36.0)
MCV: 93 fL (ref 78.0–100.0)
MONO ABS: 0.4 10*3/uL (ref 0.1–1.0)
MONOS PCT: 7 % (ref 3–12)
Neutro Abs: 4.4 10*3/uL (ref 1.7–7.7)
Neutrophils Relative %: 75 % (ref 43–77)
Platelets: 175 10*3/uL (ref 150–400)
RBC: 3.98 MIL/uL — AB (ref 4.22–5.81)
RDW: 14.7 % (ref 11.5–15.5)
WBC: 5.9 10*3/uL (ref 4.0–10.5)

## 2014-03-09 MED ORDER — HYDROMORPHONE HCL PF 1 MG/ML IJ SOLN
0.5000 mg | Freq: Once | INTRAMUSCULAR | Status: AC
  Administered 2014-03-09: 0.5 mg via INTRAVENOUS

## 2014-03-09 MED ORDER — HYDROMORPHONE HCL PF 1 MG/ML IJ SOLN
1.0000 mg | Freq: Once | INTRAMUSCULAR | Status: AC
  Administered 2014-03-09: 1 mg via INTRAVENOUS
  Filled 2014-03-09: qty 1

## 2014-03-09 MED ORDER — SODIUM CHLORIDE 0.9 % IV SOLN
Freq: Once | INTRAVENOUS | Status: AC
  Administered 2014-03-09: 09:00:00 via INTRAVENOUS

## 2014-03-09 MED ORDER — HYDROMORPHONE HCL PF 1 MG/ML IJ SOLN
0.5000 mg | Freq: Once | INTRAMUSCULAR | Status: AC
  Administered 2014-03-09: 0.5 mg via INTRAVENOUS
  Filled 2014-03-09: qty 1

## 2014-03-09 MED ORDER — HYDROMORPHONE HCL 2 MG PO TABS: 2.0000 mg | ORAL_TABLET | Freq: Four times a day (QID) | ORAL | Status: DC | PRN

## 2014-03-09 NOTE — Progress Notes (Signed)
  CARE MANAGEMENT ED NOTE 03/09/2014  Patient:  JONTAE, ADEBAYO   Account Number:  [id number]  Date Initiated:  03/09/2014  Documentation initiated by:  Edwyna Shell  Subjective/Objective Assessment:   58 yo patient with Medicare/Medicaid presenting to the ED with left knee     Subjective/Objective Assessment Detail:     Action/Plan:   The patient will go to the local [facility] equipment store and pick up the specified rolling walker upon discharge   Action/Plan Detail:   Anticipated DC Date:       Status Recommendation to Physician:   Result of Recommendation:  Agreed    [place]  Other  CM consult    Choice offered to / List presented to:    DME arranged  Vassie Moselle     DME agency  [place].        Status of service:  Completed, signed off  ED Comments:   ED Comments Detail:  CM spoke with patient regarding the request for a rolling walker based on the PT eval. This CM spoke with the patient regarding his left knee pain and he stated that he has an orthopedic MD appointment tomorrow. He stated that he has been using a cane but is difficult. The order for the rolling walker was placed and Jermaine was notified of DME need. This Cm received a return call from [facility] the patient needs a specialty walker based on his height and that we do not keep those on site. He informed this CM that the patient can bring the discharge paperwork with the order to the [facility] store and obtain the proper rolling walker. This CM provided the patient with the [facility] store address, the printed order, the PT eval for need and explained that he take this to the store and can then pick up the walker. The patient verbalized understanding and appreciation and had no further questions or concerns.

## 2014-03-09 NOTE — ED Notes (Signed)
Pt. Is aware of needing a urine specimen 

## 2014-03-09 NOTE — ED Provider Notes (Signed)
Medical screening examination/treatment/procedure(s) were performed by non-physician practitioner and as supervising physician I was immediately available for consultation/collaboration.     Veryl Speak, MD 03/09/14 [phone]

## 2014-03-09 NOTE — ED Notes (Signed)
Pt. Was here yesterday for the lt. Knee pain.  Continues to have lt. Knee pain.  Pt. Received Dilaudid injections last night which was helpful, but pt. Went home. Pain came back.  Took a Percocet which only helped him sleep.  Pt. Is back for pain control.  Pt. Also did not take any of his am medications.  BP is elevated.

## 2014-03-09 NOTE — ED Notes (Signed)
Pt placed on monitor. Pt monitored by pulse ox and blood pressure.

## 2014-03-09 NOTE — Progress Notes (Signed)
*  Preliminary Results* Left lower extremity venous duplex completed. Left lower extremity is negative for deep vein thrombosis. There is no evidence of left Baker's cyst.  03/09/2014 9:28 AM  Maudry Mayhew, RVT, RDCS, RDMS

## 2014-03-09 NOTE — Discharge Instructions (Signed)
Walker Use HOW TO TELL IF A WALKER IS THE RIGHT SIZE  With your arms hanging at your sides, the walker handles should be at wrist level. If you cannot find the exact fit, choose the height that is most comfortable.  If you have been instructed to not place weight on one of your legs, you may feel more comfortable with a shorter height. If you are using the walker for balance, you may prefer a taller height.  Adjust the height by using the push buttons on the legs of your walker.  In rest position, the back leg of the walkers should be no further ahead than your toes. With your hands resting on the grips, your elbows should be slightly bent at about a 30 degree angle.  Ask your physical therapist or caregiver if you have any concerns. HOW TO USE A STANDARD WALKER (NO WHEELS)  Pick your walker up (do not slide your walker) and place it one step length in front of you. The back legs of the walker should be no further ahead than your toes. You should not feel like you need to lean forward to keep your hands on the grips. As you set the walker down, make sure all 4 leg tips contact the ground at the same time.  Hold onto the walker for support and step forward with your weaker leg into the middle of the walker. Follow the weight bearing instructions your caregiver has given you.  Push down with your hands and step forward with your stronger leg.  Be careful not to let the walker get too far ahead of you as you walk.  Repeat the process for each step. HOW TO USE A FRONT-WHEELED WALKER  Slide your walker forward. The back legs of the walker should be no further ahead than your toes. You should not feel like you need to lean forward to keep your hands on the grips.  Hold onto the walker for support and step forward with your weaker leg into the middle of the walker. Follow the weight bearing instructions your caregiver has given you.  Push down with your hands and step forward with your stronger  leg.  Be careful not to let the walker get too far ahead of you as you walk.  Repeat the process for each step.  If your walker does not glide well over carpet, consider cutting an "x" in 2 old tennis balls and placing them over the back legs of your walker. STANDING UP FROM A CHAIR WITH ARMRESTS  It is best to sit in a firm chair with armrests.  Position your walker directly in front of your chair. Do not pull on the walker when standing up. It is too unstable to support weight when pulled on.  Slide forward in the chair, with your weaker leg ahead and stronger leg bent near the chair.  Lean forward and push up from your chair with both hands on the armrests. Straighten your stronger leg, rising to standing. Do not pull yourself up from the walker. This may cause it to tip.  When you feel steady on your feet, carefully move one hand at a time to the walker.  Stand for a few seconds to stabilize your balance before you start to walk. STANDING UP FROM A CHAIR WITHOUT ARMRESTS  It is best to sit in a firm chair. A low seat or an overstuffed chair or sofa is hard to get out of.  Place the walker in  front of you. Do not pull on the walker when coming to a standing position.  Slide forward in the chair, with your weaker leg ahead and stronger leg bent near the chair.  Push down on the chair seat with the hand opposite your weaker leg. Keep your other hand on the center of the walker's crossbar.  Stand, steady your balance, and place your hands on the walker handgrips. SITTING DOWN  Always back up toward your chair, using your walker, until you feel the back of your legs touch the chair.  If the chair has armrests, carefully reach back to put your hands on the armrests, and slowly lower your weight.  If the chair does not have armrests, consider backing up to the side of the chair. You can then hold onto the back of the chair and the front of the seat to slowly lower yourself.  You  should never feel like you are falling into your chair. USING A WALKER ON STEPS   Before attempting to use your walker on steps, practice with your physical therapist.  If you are going up a step wide enough to accommodate the entire walker and yourself:  First, place the walker up on the step.  Second, get your feet as close to the step as you can.  Third, press down on the walker with your hands as you step up with your stronger leg. Then step up with your weaker leg.  If you are going down a step wide enough to accommodate the entire walker and yourself:  First, place the walker down on the step.  Second, hold onto the walker as you step down with your weaker leg. Then step down with your stronger leg.  If you are going up more than 1 step and have a railing:  First, turn the walker sideways, so the opening is facing in toward you.  Second, place the front 2 legs of the walker on the first step. These front legs should be positioned at the base of the next step.  Third, test the steadiness of the walker. It should feel sturdy when you press down on the handgrip that is facing the top of the steps.  Finally, placing your weight on the railing and the walker, step up with your stronger leg first. Then step up with your weaker leg.  If you are going down more than 1 step and have a railing:  First, turn the walker sideways, so the opening is facing in toward you.  Second, place the front 2 legs of the walker down on the first step. When possible, the back legs of the walker should be positioned at the base of the previous step.  Third, test the steadiness of the walker. It should feel sturdy when you press down on the handgrip that is facing the top of the steps.  Finally, placing your weight on the railing and the walker, step down with your weaker leg first. Then step down with your stronger leg.  Be sure to check the sturdiness of the walker before each step.  Make sure  you have good rubber tips on the legs of your walker to prevent it from slipping. Document Released: 08/20/2005 Document Revised: 11/12/2011 Document Reviewed: 02/27/2011 [facility] Patient Information 2015 [place], [place]. This information is not intended to replace advice given to you by your health care provider. Make sure you discuss any questions you have with your health care provider.

## 2014-03-09 NOTE — ED Provider Notes (Signed)
Medical screening examination/treatment/procedure(s) were performed by non-physician practitioner and as supervising physician I was immediately available for consultation/collaboration.    Dorie Rank, MD 03/09/14 [phone]
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
## 2014-03-09 NOTE — ED Notes (Signed)
Pt. Is aware of needing an urine specimen 

## 2014-03-10 DIAGNOSIS — M1710 Unilateral primary osteoarthritis, unspecified knee: Secondary | ICD-10-CM | POA: Diagnosis not present

## 2014-03-19 ENCOUNTER — Encounter: Payer: Self-pay | Admitting: Internal Medicine

## 2014-03-22 ENCOUNTER — Ambulatory Visit (INDEPENDENT_AMBULATORY_CARE_PROVIDER_SITE_OTHER): Payer: Medicare Other

## 2014-03-22 DIAGNOSIS — Z5181 Encounter for therapeutic drug level monitoring: Secondary | ICD-10-CM | POA: Diagnosis not present

## 2014-03-22 DIAGNOSIS — I4891 Unspecified atrial fibrillation: Secondary | ICD-10-CM | POA: Diagnosis not present

## 2014-03-22 DIAGNOSIS — Z7901 Long term (current) use of anticoagulants: Secondary | ICD-10-CM

## 2014-03-22 LAB — POCT INR: INR: 3.6

## 2014-03-29 ENCOUNTER — Ambulatory Visit (INDEPENDENT_AMBULATORY_CARE_PROVIDER_SITE_OTHER): Payer: Medicare Other | Admitting: Podiatry

## 2014-03-29 ENCOUNTER — Encounter: Payer: Self-pay | Admitting: Podiatry

## 2014-03-29 VITALS — BP 136/81 | HR 90 | Resp 18

## 2014-03-29 DIAGNOSIS — B351 Tinea unguium: Secondary | ICD-10-CM | POA: Diagnosis not present

## 2014-03-29 DIAGNOSIS — M79673 Pain in unspecified foot: Secondary | ICD-10-CM

## 2014-03-29 DIAGNOSIS — M79609 Pain in unspecified limb: Secondary | ICD-10-CM

## 2014-03-30 NOTE — Progress Notes (Signed)
Patient ID: Juan Calderon, male   DOB: 1955/10/19, 58 y.o.   MRN: [id number]   Subjective: This patient presents today complaining of painful toenails and requests debridement.  Objective: The left hallux nail demonstrates maximum hypertrophy and deformity. The remaining toenails are incurvated, discolored, brittle and tender to palpation.  Assessment: Symptomatic onychomycoses 6-10  Plan: Nails x10 debrided without bleeding  Reappoint at t three-month intervals

## 2014-04-07 DIAGNOSIS — M1710 Unilateral primary osteoarthritis, unspecified knee: Secondary | ICD-10-CM | POA: Diagnosis not present

## 2014-04-12 ENCOUNTER — Ambulatory Visit (INDEPENDENT_AMBULATORY_CARE_PROVIDER_SITE_OTHER): Payer: Medicare Other | Admitting: Pharmacist

## 2014-04-12 DIAGNOSIS — Z7901 Long term (current) use of anticoagulants: Secondary | ICD-10-CM | POA: Diagnosis not present

## 2014-04-12 DIAGNOSIS — I4891 Unspecified atrial fibrillation: Secondary | ICD-10-CM

## 2014-04-12 DIAGNOSIS — Z5181 Encounter for therapeutic drug level monitoring: Secondary | ICD-10-CM | POA: Diagnosis not present

## 2014-04-12 LAB — POCT INR
INR: 7.1
INR: 7.1

## 2014-04-12 LAB — PROTIME-INR
INR: 6.8 ratio — AB (ref 0.8–1.0)
Prothrombin Time: 71.7 s (ref 9.6–13.1)

## 2014-04-16 ENCOUNTER — Ambulatory Visit (INDEPENDENT_AMBULATORY_CARE_PROVIDER_SITE_OTHER): Payer: Medicare Other | Admitting: *Deleted

## 2014-04-16 DIAGNOSIS — I4891 Unspecified atrial fibrillation: Secondary | ICD-10-CM | POA: Diagnosis not present

## 2014-04-16 DIAGNOSIS — Z7901 Long term (current) use of anticoagulants: Secondary | ICD-10-CM | POA: Diagnosis not present

## 2014-04-16 DIAGNOSIS — Z5181 Encounter for therapeutic drug level monitoring: Secondary | ICD-10-CM | POA: Diagnosis not present

## 2014-04-16 LAB — POCT INR: INR: 1.8

## 2014-04-30 ENCOUNTER — Ambulatory Visit (INDEPENDENT_AMBULATORY_CARE_PROVIDER_SITE_OTHER): Payer: Medicare Other | Admitting: Pharmacist

## 2014-04-30 ENCOUNTER — Ambulatory Visit (INDEPENDENT_AMBULATORY_CARE_PROVIDER_SITE_OTHER): Payer: Medicare Other

## 2014-04-30 VITALS — BP 144/90 | HR 80

## 2014-04-30 DIAGNOSIS — Z7901 Long term (current) use of anticoagulants: Secondary | ICD-10-CM | POA: Diagnosis not present

## 2014-04-30 DIAGNOSIS — I4891 Unspecified atrial fibrillation: Secondary | ICD-10-CM | POA: Diagnosis not present

## 2014-04-30 DIAGNOSIS — Z5181 Encounter for therapeutic drug level monitoring: Secondary | ICD-10-CM | POA: Diagnosis not present

## 2014-04-30 DIAGNOSIS — R079 Chest pain, unspecified: Secondary | ICD-10-CM | POA: Diagnosis not present

## 2014-04-30 LAB — POCT INR: INR: 3

## 2014-04-30 NOTE — Progress Notes (Signed)
Late Entry:  3:30 PM Pt was in the office today for coumadin check.  The pt complained of off and on quick sharp pains in his left upper breast.  This has been ongoing for a couple of days and it is not related to movement or touch.  The pt's SOB is at baseline and he has had no other associated symptoms. EKG shows Afib. I reviewed this information with Dr Ron Parker (DOD) and he recommend that the pt schedule a follow-up appointment with Dr Angelena Form in the near future. Appointment scheduled on 05/13/14.  I advised the pt to contact our office with any other questions or concerns or proceed to ER with worsening symptoms. Pt agreed with plan.

## 2014-04-30 NOTE — Patient Instructions (Signed)
Your physician recommends that you schedule a follow-up appointment with Dr Angelena Form.

## 2014-05-13 ENCOUNTER — Ambulatory Visit: Payer: Medicare Other | Admitting: Cardiovascular Disease

## 2014-05-13 ENCOUNTER — Ambulatory Visit (INDEPENDENT_AMBULATORY_CARE_PROVIDER_SITE_OTHER): Payer: Medicare Other | Admitting: Cardiovascular Disease

## 2014-05-13 ENCOUNTER — Ambulatory Visit (INDEPENDENT_AMBULATORY_CARE_PROVIDER_SITE_OTHER): Payer: Medicare Other | Admitting: Internal Medicine

## 2014-05-13 ENCOUNTER — Encounter: Payer: Self-pay | Admitting: Internal Medicine

## 2014-05-13 ENCOUNTER — Encounter: Payer: Self-pay | Admitting: Cardiovascular Disease

## 2014-05-13 VITALS — BP 155/92 | HR 58 | Temp 97.3°F | Wt 307.9 lb

## 2014-05-13 VITALS — BP 142/98 | HR 87 | Ht 72.0 in | Wt 307.0 lb

## 2014-05-13 DIAGNOSIS — M47812 Spondylosis without myelopathy or radiculopathy, cervical region: Secondary | ICD-10-CM | POA: Diagnosis not present

## 2014-05-13 DIAGNOSIS — M1732 Unilateral post-traumatic osteoarthritis, left knee: Secondary | ICD-10-CM

## 2014-05-13 DIAGNOSIS — M1A00X0 Idiopathic chronic gout, unspecified site, without tophus (tophi): Secondary | ICD-10-CM

## 2014-05-13 DIAGNOSIS — E1142 Type 2 diabetes mellitus with diabetic polyneuropathy: Secondary | ICD-10-CM | POA: Diagnosis not present

## 2014-05-13 DIAGNOSIS — I10 Essential (primary) hypertension: Secondary | ICD-10-CM

## 2014-05-13 DIAGNOSIS — I4891 Unspecified atrial fibrillation: Secondary | ICD-10-CM | POA: Diagnosis not present

## 2014-05-13 DIAGNOSIS — N521 Erectile dysfunction due to diseases classified elsewhere: Secondary | ICD-10-CM

## 2014-05-13 DIAGNOSIS — I5032 Chronic diastolic (congestive) heart failure: Secondary | ICD-10-CM

## 2014-05-13 DIAGNOSIS — J449 Chronic obstructive pulmonary disease, unspecified: Secondary | ICD-10-CM | POA: Diagnosis not present

## 2014-05-13 DIAGNOSIS — E785 Hyperlipidemia, unspecified: Secondary | ICD-10-CM

## 2014-05-13 DIAGNOSIS — I2510 Atherosclerotic heart disease of native coronary artery without angina pectoris: Secondary | ICD-10-CM

## 2014-05-13 DIAGNOSIS — G894 Chronic pain syndrome: Secondary | ICD-10-CM | POA: Diagnosis not present

## 2014-05-13 DIAGNOSIS — E1149 Type 2 diabetes mellitus with other diabetic neurological complication: Secondary | ICD-10-CM

## 2014-05-13 DIAGNOSIS — I509 Heart failure, unspecified: Secondary | ICD-10-CM | POA: Diagnosis not present

## 2014-05-13 DIAGNOSIS — N529 Male erectile dysfunction, unspecified: Secondary | ICD-10-CM | POA: Diagnosis not present

## 2014-05-13 DIAGNOSIS — I482 Chronic atrial fibrillation, unspecified: Secondary | ICD-10-CM

## 2014-05-13 DIAGNOSIS — I4819 Other persistent atrial fibrillation: Secondary | ICD-10-CM

## 2014-05-13 DIAGNOSIS — E1140 Type 2 diabetes mellitus with diabetic neuropathy, unspecified: Secondary | ICD-10-CM

## 2014-05-13 DIAGNOSIS — Z23 Encounter for immunization: Secondary | ICD-10-CM

## 2014-05-13 LAB — LIPID PANEL
Cholesterol: 107 mg/dL (ref 0–200)
HDL: 54 mg/dL (ref >39)
LDL Cholesterol: 25 mg/dL (ref 0–99)
Total CHOL/HDL Ratio: 2 Ratio
Triglycerides: 138 mg/dL (ref <150)
VLDL: 28 mg/dL (ref 0–40)

## 2014-05-13 LAB — POCT GLYCOSYLATED HEMOGLOBIN (HGB A1C): HEMOGLOBIN A1C: 6.5

## 2014-05-13 LAB — GLUCOSE, CAPILLARY: Glucose-Capillary: 161 mg/dL — ABNORMAL HIGH (ref 70–99)

## 2014-05-13 MED ORDER — VALSARTAN 320 MG PO TABS: 320.0000 mg | ORAL_TABLET | Freq: Every day | ORAL | Status: DC

## 2014-05-13 MED ORDER — MELOXICAM 15 MG PO TABS: 15.0000 mg | ORAL_TABLET | Freq: Every day | ORAL | Status: DC | PRN

## 2014-05-13 MED ORDER — FUROSEMIDE 80 MG PO TABS: 80.0000 mg | ORAL_TABLET | Freq: Every day | ORAL | Status: DC

## 2014-05-13 NOTE — Assessment & Plan Note (Signed)
He has had no more flares of his gout on the allopurinol 300 mg by mouth daily, which has resulted in a uric acid level less than 6.0. We will therefore continue the allopurinol at 300 mg by mouth daily.

## 2014-05-13 NOTE — Assessment & Plan Note (Signed)
Given his response to the meloxicam we are converting this sulindac to meloxicam 15 mg by mouth daily for pain in the left lower extremity as well as spine. We will reassess his pain control at the followup visit. If the meloxicam is more effective than the sulindac we will continue the meloxicam. If this sulindac seems to of been better, we can always convert back to the sulindac at that visit.

## 2014-05-13 NOTE — Assessment & Plan Note (Signed)
His blood pressure was elevated at 155/92 today. This is despite compliance with his metoprolol 100 mg by mouth twice daily, ATerazosin 5 mg by mouth each night, and valsartan 160 mg by mouth daily. We therefore decided to increase the valsartan to 320 mg by mouth daily and continue the metoprolol at 100 mg by mouth twice daily and Terazosin at 5 mg by mouth each night. We will reassess the blood pressure control at the followup visit on this escalated regimen.

## 2014-05-13 NOTE — Assessment & Plan Note (Signed)
He has had no chest pain recently on metoprolol 100 mg by mouth twice daily. He therefore has not required any sublingual nitroglycerin. Nonetheless, we will continue the metoprolol at 100 mg twice daily and make sure he has a supply of sublingual nitroglycerin available. We will also aggressively address his cardiac risk factors including blood pressure and lipids.

## 2014-05-13 NOTE — Progress Notes (Signed)
History of Present Illness: 59 yo male with history of diastolic CHF, CAD, paroxysmal AFib, COPD, HTN, HLD, gout, GERD who is here today for cardiac follow up. He lives here and spends time with family in [place], [place] ([place] He had been followed by Dr. [name] [facility] in [place], [place]. Echo 6/11: mild LVH, EF of 55%. mild LAE. Monitor April 2012 in [place], [place] showed NSR with episodes of sinus brady, rare PVCs, occasional PACs and brief episodes of atrial tachycardia. Per records he has been on Multaq in the past but did not like this medication so it was stopped. He had a stress myoview 09/14/05 that showed reversible ischemia in the inferior wall. This led to a cardiac cath on 10/01/05 which showed 25% mid LAD stenosis per report but no other evidence of CAD. Last Myoview 9/13: No ischemia. He was seen in the emergency room 08/23/12 with complaints of edema. BNP was mildly elevated. There was some suggestion of pulmonary venous congestion and possible pleural effusions on chest x-ray. At visit March 2014 his weight was up 7 lbs and his Lasix was increased. He was seen several times in f/u by Richardson Dopp, PA-C and not felt to be volume overloaded. TSH was normal. He was seen in primary care 04/24/13 and his renal function had worsened. Lasix was changed to 60 mg po QDaily and renal function returned to normal. Recent diagnosis of COPD.   He is here today for follow up.  No chest pain. He does note increased dyspnea. He was seen in primary care this am by Dr. Eppie Gibson and his Lasix was increased to 80 mg daily. His BP was elevated this am so Cozaar increased in primary care.   Primary Care Physician:  Eppie Gibson   Past Medical History  Diagnosis Date  . Essential hypertension 09/20/2011  . Atrial fibrillation 02/04/2012  . Chronic diastolic heart failure 01/06/3874    with mild left ventricular hypertrophy on Echo 02/2010  . Chronic pain syndrome 01/15/2013    Left arm and leg  s/p traumatic injury   . Osteoarthritis cervical spine 04/25/2013  . Gastroesophageal reflux disease 04/25/2013  . Open-angle glaucoma 04/25/2013  . 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 04/25/2013  . Hyperlipidemia LDL goal < 100 04/25/2013  . Coronary artery disease 04/25/2013    25% LAD stenosis on cath 2007.  Stable angina.  . Asthma, chronic 04/25/2013    Clinical diagnosis  . Gout 04/25/2013  . Right rotator cuff tear 04/25/2013    Large full-thickness tear of the supraspinatus with mild retraction but no atrophy   . Vasomotor rhinitis 04/25/2013  . Obstructive sleep apnea 06/01/2013    Moderate, AHI 29.8 per hour with moderately loud snoring and oxygen desaturation to a nadir of 79%. CPAP titration resulted in a prescription for 17 CWP.    [name] Ulcer     History of ulcer in the remote past  . Cataract     Left eye  . Onychomycosis of toenail 12/24/2012    Left great toe   . Morbid obesity with BMI of 40.0-44.9, adult 04/25/2013  . Blood transfusion without reported diagnosis   . Chronic obstructive pulmonary disease 04/25/2013  . Diverticulosis 11/12/2013  . Osteoarthritis of left knee 06/19/2013    Tricompartmental disease.  Treated with double hinged upright knee brace, steroid/xylocaine knee injections, and NSAIDs     Past Surgical History  Procedure Laterality Date  . Left leg  surgery    . Left arm surgery    . Shoulder surgery      Right  . Fracture surgery Left 1980's    Elbow    Current Outpatient Prescriptions  Medication Sig Dispense Refill  . albuterol (PROAIR HFA) 108 (90 BASE) MCG/ACT inhaler Inhale 2 puffs into the lungs every 6 (six) hours as needed for wheezing or shortness of breath.  17 each  3  . allopurinol (ZYLOPRIM) 300 MG tablet Take 1 tablet (300 mg total) by mouth daily.  90 tablet  3  . atorvastatin (LIPITOR) 20 MG tablet Take 1 tablet (20 mg total) by mouth daily.  90 tablet  3  . budesonide-formoterol (SYMBICORT) 160-4.5  MCG/ACT inhaler Inhale 2 puffs into the lungs 2 (two) times daily.  1 Inhaler  12  . diclofenac (VOLTAREN) 50 MG EC tablet Take 1 tablet (50 mg total) by mouth 2 (two) times daily.  180 tablet  3  . esomeprazole (NEXIUM) 40 MG capsule Take 1 capsule (40 mg total) by mouth daily at 12 noon.  90 capsule  3  . fenofibrate 160 MG tablet Take 1 tablet (160 mg total) by mouth daily.  90 tablet  3  . furosemide (LASIX) 80 MG tablet Take 1 tablet (80 mg total) by mouth daily.  90 tablet  3  . latanoprost (XALATAN) 0.005 % ophthalmic solution Place 1 drop into both eyes at bedtime.  7.5 mL  3  . loratadine (CLARITIN) 10 MG tablet Take 1 tablet (10 mg total) by mouth daily as needed for allergies.  90 tablet  3  . meloxicam (MOBIC) 15 MG tablet Take 1 tablet (15 mg total) by mouth daily as needed for pain.  90 tablet  3  . metoprolol (LOPRESSOR) 100 MG tablet Take 1 tablet (100 mg total) by mouth 2 (two) times daily.  180 tablet  3  . nitroGLYCERIN (NITROSTAT) 0.4 MG SL tablet Place 1 tablet (0.4 mg total) under the tongue every 5 (five) minutes as needed.  75 tablet  3  . sildenafil (VIAGRA) 100 MG tablet Take 1 tablet (100 mg total) by mouth as needed for erectile dysfunction.  5 tablet  11  . terazosin (HYTRIN) 5 MG capsule Take 1 capsule (5 mg total) by mouth at bedtime.  90 capsule  3  . valsartan (DIOVAN) 320 MG tablet Take 1 tablet (320 mg total) by mouth daily.  90 tablet  3  . warfarin (COUMADIN) 5 MG tablet One tablet (5 mg) by mouth daily except one-half tablet (2.5 mg) by mouth on Mondays, Wednesdays, and Fridays  75 tablet  0  . oxyCODONE-acetaminophen (PERCOCET) 10-325 MG per tablet Take 1 tablet by mouth every 6 (six) hours as needed for pain.  120 tablet  0   No current facility-administered medications for this visit.    Allergies  Allergen Reactions  . Ramipril Swelling  . Testosterone Rash    History   Social History  . Marital Status: Widowed    Spouse Name: N/A    Number of  Children: N/A  . Years of Education: N/A   Occupational History  . Not on file.   Social History Main Topics  . Smoking status: Never Smoker   . Smokeless tobacco: Never Used  . Alcohol Use: 1.0 oz/week    2 drink(s) per week     Comment: Liquor.  . Drug Use: No  . Sexual Activity: Yes    Birth Control/ Protection: None  Other Topics Concern  . Not on file   Social History Narrative  . No narrative on file    Family History  Problem Relation Age of Onset  . Heart failure Mother   . Alzheimer's disease Father   . Healthy Sister   . Healthy Brother   . Healthy Son   . Healthy Sister   . Healthy Sister   . Healthy Sister   . Healthy Sister   . Healthy Brother   . Healthy Brother   . Healthy Brother   . Heart failure Brother   . Healthy Brother   . Osteoarthritis Brother   . Prostate cancer Brother   . Early death Brother     Gun Shot Wound    Review of Systems:  As stated in the HPI and otherwise negative.   BP 142/98  Pulse 87  Ht 6' (1.829 m)  Wt 307 lb (139.254 kg)  BMI 41.63 kg/m2  Physical Examination: General: Well developed, well nourished, NAD HEENT: OP clear, mucus membranes moist SKIN: warm, dry. No rashes. Neuro: No focal deficits Musculoskeletal: Muscle strength 5/5 all ext Psychiatric: Mood and affect normal Neck: No JVD, no carotid bruits, no thyromegaly, no lymphadenopathy. Lungs:Clear bilaterally, no wheezes, rhonci, crackles Cardiovascular: Irreg irreg rate and rhythm. No murmurs, gallops or rubs. Abdomen:Soft. Bowel sounds present. Non-tender.  Extremities: No lower extremity edema. Pulses are 2 + in the bilateral DP/PT.  EKG: Atrial fib, rate 87 bpm. Non-specific ST and T wave changes.   Assessment and Plan:   1. Chronic Diastolic CHF: Weight is stable. Lasix increased in primary care secondary to dyspnea. Continue Lasix 80 mg po Qdaily.   2. Atrial Fibrillation: Rate controlled. He remains on Coumadin. He is followed in our  coumadin clinic.   3. Hypertension: Elevated. Changes made in primary care today. Continue Diovan, Lopressor, Hytrin  4. CAD: Stable. He is not on an ASA since he has been on coumadin. Continue statin, beta blocker. Recommend daily exercise.

## 2014-05-13 NOTE — Addendum Note (Signed)
Addended by: Marcelino Duster on: 05/13/2014 04:54 PM   Modules accepted: Orders

## 2014-05-13 NOTE — Assessment & Plan Note (Signed)
His dyspnea on exertion has not responded to the addition of Symbicort to the as needed albuterol. He admits that he is no worse symptomatically since stopping the Spiriva but this raises the possibility that his dyspnea on exertion is related to his chronic diastolic heart failure and requires more aggressive therapy. We will continue the Symbicort and as needed albuterol for the time being as we assess more aggressive therapy on his symptomatology with regards to his chronic diastolic heart failure.

## 2014-05-13 NOTE — Patient Instructions (Signed)
Your physician wants you to follow-up in: 12 months.  You will receive a reminder letter in the mail two months in advance. If you don't receive a letter, please call our office to schedule the follow-up appointment.  Your physician recommends that you continue on your current medications as directed. Please refer to the Current Medication list given to you today.   

## 2014-05-13 NOTE — Assessment & Plan Note (Signed)
His rate remains controlled on the metoprolol 100 mg by mouth twice daily. Once again, it looks like the pharmacy placed the digoxin back on his medication list even though this was stopped. He confirmed with me that he was still not taking the digoxin. He is also continuing on the Coumadin and is followed by the anticoagulation clinic at [facility]. We will continue the metoprolol 100 mg by mouth twice daily for rate control. The digoxin was once again removed from his medication list. It is hoped it will not be inadvertently added on once again.

## 2014-05-13 NOTE — Addendum Note (Signed)
Addended by: Oval Linsey D on: 05/13/2014 05:19 PM   Modules accepted: Orders

## 2014-05-13 NOTE — Patient Instructions (Addendum)
It was great to see you again.  I am sorry your breathing is no better.  Congrats on doing such a great job with your diabetes!  1) Keep taking all of the medications as you are with the exception of the sulindac which we are stopping and the lasix and valsartan which we are increasing.  2) In place of the sulindac we are prescribing meloxicam 15 mg by mouth once daily as needed for back and left leg pain.  3) I increased your lasix (furosemide) to 80 mg by mouth each morning in hopes of improving your breathing while exerting yourself.  4) I increased your valsartan to 320 mg by mouth daily for your blood pressure and heart failure.  5) We checked your urine for protein and your cholesterol levels today.  I will call you with any concerns early next week when I get the results.  6) You received the flu shot today.  7) Call me with the name of the product for the erectile dysfunction that you heard about so that I can look into it.  8) I am referring you to a wound specialist to address the small wound on your lower left leg.  I will see you back in 3 months, sooner if necessary.

## 2014-05-13 NOTE — Assessment & Plan Note (Signed)
In early July he had an exacerbation of his left lower extremity pain that was not responsive to the Percocet or sulindac. He was seen in the emergency department twice and was prescribed meloxicam. He states the meloxicam seemed to be effective. He has since run out of the meloxicam and is back on the Percocet and sulindac with control of his lower extremity pain. Given his response to the meloxicam, it was decided to change the sulindac to meloxicam and continue the Percocet for his pain both in the lower extremity as well as back. We will reassess his response to this change at the followup visit. If he feels it is better than the sulindac we will continue the meloxicam, otherwise, we can always convert back to the sulindac which was also effective at controlling his arthritic pain.  He pointed out a very small, shallow, clean-based ulcer just above the left medial malleolus in the area of his previous trauma. He states this small lesion has been present for a few months. He will be referred to wound therapy to address any wound care needs or dressings that should be applied.

## 2014-05-13 NOTE — Assessment & Plan Note (Signed)
He received a flu vaccination today. Otherwise, he is up-to-date on his health care maintenance.

## 2014-05-13 NOTE — Progress Notes (Signed)
   Subjective:    Patient ID: Juan Calderon, male    DOB: [birth year], 58 y.o.   MRN: [id number]  HPI  Please see the A&P for the status of the pt's chronic medical problems.  Review of Systems  Constitutional: Negative for activity change, appetite change and unexpected weight change.  Respiratory: Positive for shortness of breath. Negative for chest tightness and wheezing.   Cardiovascular: Positive for leg swelling. Negative for chest pain and palpitations.  Gastrointestinal: Positive for abdominal distention. Negative for nausea, vomiting, abdominal pain, diarrhea and constipation.  Musculoskeletal: Positive for arthralgias and myalgias. Negative for joint swelling.  Skin: Positive for wound. Negative for color change and rash.  Neurological: Negative for dizziness, syncope and light-headedness.      Objective:   Physical Exam  Nursing note and vitals reviewed. Constitutional: He is oriented to person, place, and time. He appears well-developed and well-nourished. No distress.  HENT:  Head: Normocephalic and atraumatic.  Eyes: Conjunctivae are normal. Right eye exhibits no discharge. Left eye exhibits no discharge. No scleral icterus.  Cardiovascular: Normal rate, regular rhythm and normal heart sounds.  Exam reveals no gallop and no friction rub.   No murmur heard. Pulmonary/Chest: Effort normal and breath sounds normal. No respiratory distress. He has no wheezes. He has no rales.  Abdominal: Soft. Bowel sounds are normal. He exhibits no distension. There is no tenderness. There is no rebound and no guarding.  Musculoskeletal: Normal range of motion. He exhibits edema.  Neurological: He is alert and oriented to person, place, and time. He exhibits normal muscle tone.  Skin: Skin is warm and dry. He is not diaphoretic.  Small, shallow, clean base ulcer above left medial malleolus  Psychiatric: He has a normal mood and affect. His behavior is normal. Judgment and thought content  normal.      Assessment & Plan:   Please see problem oriented charting.

## 2014-05-13 NOTE — Assessment & Plan Note (Signed)
He continues to have dyspnea on exertion and occasional orthopnea. This is despite the Symbicort and albuterol for the presumed chronic asthmatic bronchitis. Given the lack of improvement with either the Symbicort or Spiriva previously, and the fact that he has never smoked, I am concerned this dyspnea on exertion may represent his chronic diastolic heart failure. The fact that he has an elevated afterload does not help the situation and has been aggressively addressed by increasing the valsartan to 320 mg by mouth daily. We will also increase the Lasix from 60 mg every morning to 80 mg every morning. At the followup visit we will reassess his renal function and potassium levels on this increased dose. In addition, we will reassess his dyspnea on exertion and orthopnea, looking for improvement. In the meantime, he is to continue the albuterol and Symbicort so as not to confuse the picture by making too many adjustments in his regimen. If his symptoms respond to the increase in the Lasix dose and decreased afterload we will discontinue the Symbicort at the followup visit.

## 2014-05-13 NOTE — Assessment & Plan Note (Addendum)
His hemoglobin A1c this morning was 6.5 on diet alone. This is much improved from 7.4 during the previous visit. He was able to do this with increased activity and diet. He was praised for this accomplishment and encouraged to continue remaining active and watching his diet. A urine microalbumin was obtained today and he is otherwise up-to-date on his diabetic health maintenance.  He still has been unable to afford to Viagra. I again asked him to call me with the name of the substance that he heard about for erectile dysfunction and I would be happy to look into it for him.

## 2014-05-13 NOTE — Assessment & Plan Note (Signed)
At the last visit we decreased the atorvastatin to 20 mg by mouth daily because of a very low LDL cholesterol. A lipid panel was drawn today and the results are pending at the time of this dictation. We will followup the results and respond appropriately. In the meantime, we will continue the atorvastatin at 20 mg by mouth daily. We will also continue the fenofibrate at 160 mg by mouth daily.

## 2014-05-14 ENCOUNTER — Encounter: Payer: Medicare Other | Admitting: Internal Medicine

## 2014-05-14 LAB — MICROALBUMIN / CREATININE URINE RATIO
Creatinine, Urine: 65.8 mg/dL
MICROALB UR: 3.46 mg/dL — AB (ref 0.00–1.89)
Microalb Creat Ratio: 52.6 mg/g — ABNORMAL HIGH (ref 0.0–30.0)

## 2014-05-20 MED ORDER — ATORVASTATIN CALCIUM 10 MG PO TABS: 10.0000 mg | ORAL_TABLET | Freq: Every day | ORAL | Status: DC

## 2014-05-20 NOTE — Addendum Note (Signed)
Addended by: Oval Linsey D on: 05/20/2014 09:39 AM   Modules accepted: Orders

## 2014-05-20 NOTE — Progress Notes (Signed)
Urine microalbumin 3.46 Urine creatinine 65.8 Microalbumin-Creatinine Ratio: 52.6  This is an elevation in the ratio which is new.  At the visit we increased the valsartan to 320 mg for the blood pressure.  His should also address the microalbuminuria.  We will recheck the urine microalbuminuria on the higher dose of the valsartan at a follow-up appointment.  Total cholesterol 107 Triglycerides 138 HDL 54 LDL 25  LDL remains too low on the atorvastatin 20 mg daily.  There are risks to an LDL this low so we will further decrease the atorvastatin dose to 20 mg daily.  Juan Calderon was called with this information and recommendation.  He agrees to the change.  We will repeat the lipid panel at the follow-up visit to assure the LDL is > 39 on the lower dose.

## 2014-05-21 ENCOUNTER — Ambulatory Visit (INDEPENDENT_AMBULATORY_CARE_PROVIDER_SITE_OTHER): Payer: Medicare Other

## 2014-05-21 DIAGNOSIS — Z5181 Encounter for therapeutic drug level monitoring: Secondary | ICD-10-CM | POA: Diagnosis not present

## 2014-05-21 DIAGNOSIS — I4891 Unspecified atrial fibrillation: Secondary | ICD-10-CM

## 2014-05-21 DIAGNOSIS — Z7901 Long term (current) use of anticoagulants: Secondary | ICD-10-CM

## 2014-05-21 DIAGNOSIS — I482 Chronic atrial fibrillation, unspecified: Secondary | ICD-10-CM

## 2014-05-21 LAB — POCT INR: INR: 3.4

## 2014-05-28 ENCOUNTER — Other Ambulatory Visit: Payer: Self-pay | Admitting: Internal Medicine

## 2014-05-28 DIAGNOSIS — G894 Chronic pain syndrome: Secondary | ICD-10-CM

## 2014-05-28 MED ORDER — OXYCODONE-ACETAMINOPHEN 10-325 MG PO TABS: 1.0000 | ORAL_TABLET | Freq: Four times a day (QID) | ORAL | Status: DC | PRN

## 2014-05-28 NOTE — Progress Notes (Signed)
Pt walked into [facility] Friday afternoon at 3:30 and said he needed his percocet. He states he was told he could walk in whenever he needed more. Reviewed chart - Dr Eppie Gibson giving three Rx's at a time and pain addressed last two appts. Last Rx was to be filled 04/24/14. He has no F/U appt sch but needs Dec appt.  1. Printed off 3 paper scripts and gave all three to pt. 2. Pt needs to sch Dec appt with Dr Eppie Gibson 3. Educate pt that he needs to call 2 days in advance of running out of med 4. I did not pull [place] controlled database report nor order UDs - will leave to PCP.

## 2014-05-28 NOTE — Progress Notes (Signed)
Front desk pool assisting pt with appt with Dr Eppie Gibson.

## 2014-06-03 ENCOUNTER — Ambulatory Visit (INDEPENDENT_AMBULATORY_CARE_PROVIDER_SITE_OTHER): Payer: Medicare Other | Admitting: *Deleted

## 2014-06-03 DIAGNOSIS — I4891 Unspecified atrial fibrillation: Secondary | ICD-10-CM | POA: Diagnosis not present

## 2014-06-03 DIAGNOSIS — I482 Chronic atrial fibrillation, unspecified: Secondary | ICD-10-CM

## 2014-06-03 DIAGNOSIS — Z5181 Encounter for therapeutic drug level monitoring: Secondary | ICD-10-CM

## 2014-06-03 DIAGNOSIS — Z7901 Long term (current) use of anticoagulants: Secondary | ICD-10-CM | POA: Diagnosis not present

## 2014-06-03 LAB — POCT INR: INR: 2.2

## 2014-06-04 ENCOUNTER — Encounter (HOSPITAL_BASED_OUTPATIENT_CLINIC_OR_DEPARTMENT_OTHER): Payer: Medicare Other | Attending: General Surgery

## 2014-06-04 DIAGNOSIS — L97929 Non-pressure chronic ulcer of unspecified part of left lower leg with unspecified severity: Secondary | ICD-10-CM | POA: Diagnosis not present

## 2014-06-04 DIAGNOSIS — E11622 Type 2 diabetes mellitus with other skin ulcer: Secondary | ICD-10-CM | POA: Diagnosis not present

## 2014-06-04 DIAGNOSIS — I87332 Chronic venous hypertension (idiopathic) with ulcer and inflammation of left lower extremity: Secondary | ICD-10-CM | POA: Diagnosis not present

## 2014-06-04 LAB — GLUCOSE, CAPILLARY: Glucose-Capillary: 128 mg/dL — ABNORMAL HIGH (ref 70–99)

## 2014-06-07 NOTE — Progress Notes (Signed)
[facility]  NAME:  GREY, RAKESTRAW                     ACCOUNT NO.:  MEDICAL RECORD NO.:  [id number]      DATE OF BIRTH:  1955-10-05  PHYSICIAN:  Ricard Dillon, M.D.      VISIT DATE:                                  OFFICE VISIT   HISTORY:  This is a 58 year old man who was sent over courtesy of his primary physician, Dr. Oval Linsey at [facility]. He is a 58 year old, diet controlled diabetic with a recent hemoglobin A1c of 6.5.  He tells me that he has a remote history of severe soft tissue trauma to his leg dating back into the 1970s.  He required extensive rounds of the surgery including plastic surgery to close soft tissue deficits.  He denies any history of fractures, hardware, etc.  In July, he had lower extremity pain.  I think this was felt to be musculoskeletal.  He underwent venous Dopplers that were negative.  He has since developed several small open areas in the left leg with some drainage and he is here for our review of this.  PAST MEDICAL HISTORY: 1. Vasomotor rhinitis. 2. Type 2 diabetes with neuropathy. 3. Right rotator cuff tear. 4. Osteoarthritis of the left knee. 5. Osteoarthritis of the cervical spine. 6. Open angle glaucoma. 7. Obstructive sleep apnea. 8. Morbid obesity. 9. Idiopathic chronic gout. 10.Hyperlipidemia. 11.Gastroesophageal reflux disease. 12.Essential hypertension. 13.Diverticulosis. 14.Coronary artery disease with a history of chronic diastolic heart     failure. 15.Chronic atrial fibrillation. 16.Chronic asthmatic bronchitis.  CURRENT MEDICATIONS: 1. Albuterol 2 puffs q.6 p.r.n. 2. Allopurinol 300 daily. 3. Lipitor 20 daily. 4. Symbicort 160/4.5, 2 puffs b.i.d. 5. Voltaren 50 b.i.d. 6. Nexium 40 daily. 7. Fenofibrate 160 daily. 8. Lasix 80 daily. 9. Xalatan ophthalmic. 10.Claritin 10 daily. 11.Mobic 15 daily. 12.Lopressor 100 b.i.d. 13.Nitrostat 0.4 p.r.n. 14.Percocet 10/325,  one p.o. q.6 p.r.n. 15.Hytrin 5 daily. 16.Diovan 320 daily.  PHYSICAL EXAMINATION:  VITAL SIGNS:  Temperature 98.2, pulse 58, respirations 18, blood pressure 141/100, CBG is 128. VASCULAR ASSESSMENT:  His ABI on the left as calculated in this clinic was 0.78.  Peripheral pulses were difficult to feel.  He had delayed capillary refill time.  Even at the popliteal area, I could not feel a pulse. The left leg has extensive evidence of prior surgery with long what appears to be a graft closure along the medial aspect of the leg and then a second area of graft closure anteriorly that moves in an oblong direction laterally.  There is some discoloration of the medial graft area with a small ulceration on the rim of the surgical site distally. There is no drainage here.  Over the anterolateral site, there is some swelling here without pain.  There was some small open area present. There was no tenderness.  No overt infection.  IMPRESSION/PLAN: 1. Extensive prior trauma with flap closures x2 on the left lower leg.     He tells me there is no hardware underneath this. 2. Small open areas as described mostly in the distal medial graft     site.  There is also some discoloration here.  There is also some     smaller areas over the anterior part of the  leg that appear to be     draining.  Nothing here looked to be something that would be     cultured.  I prescribed a silver alginate to these areas under a     Kerlix Coban wrap. 3. Type 2 diabetes with questionable peripheral arterial disease.     Before anything further is done to this leg, I would like arterial     Dopplers to assess the adequacy of his distal circulation.     Although, his pain that he has had earlier in the summer does not     sound like claudication, I am unable to obtain peripheral pulses     even at the popliteal and his ABI was only 0.78. 4. We will review his leg in a week's time.  I would like him to leave     this  wrapped.  We will arrange for arterial studies to be done at     [facility].          ______________________________ Ricard Dillon, M.D.     MGR/MEDQ  D:  06/04/2014  T:  06/04/2014  Job:  [id number]

## 2014-06-09 ENCOUNTER — Ambulatory Visit (HOSPITAL_COMMUNITY)
Admission: RE | Admit: 2014-06-09 | Discharge: 2014-06-09 | Disposition: A | Discharge status: Home / Self Care | Payer: Medicare Other | Source: Ambulatory Visit | Attending: Vascular Surgery | Admitting: Vascular Surgery

## 2014-06-09 ENCOUNTER — Other Ambulatory Visit (HOSPITAL_COMMUNITY): Payer: Self-pay | Admitting: General Surgery

## 2014-06-09 DIAGNOSIS — L97329 Non-pressure chronic ulcer of left ankle with unspecified severity: Secondary | ICD-10-CM

## 2014-06-10 DIAGNOSIS — I87332 Chronic venous hypertension (idiopathic) with ulcer and inflammation of left lower extremity: Secondary | ICD-10-CM | POA: Diagnosis not present

## 2014-06-10 DIAGNOSIS — E11622 Type 2 diabetes mellitus with other skin ulcer: Secondary | ICD-10-CM | POA: Diagnosis not present

## 2014-06-10 DIAGNOSIS — L97929 Non-pressure chronic ulcer of unspecified part of left lower leg with unspecified severity: Secondary | ICD-10-CM | POA: Diagnosis not present

## 2014-06-11 ENCOUNTER — Other Ambulatory Visit: Payer: Self-pay | Admitting: *Deleted

## 2014-06-11 DIAGNOSIS — I482 Chronic atrial fibrillation, unspecified: Secondary | ICD-10-CM

## 2014-06-14 MED ORDER — WARFARIN SODIUM 5 MG PO TABS: ORAL_TABLET | ORAL | Status: DC

## 2014-06-18 ENCOUNTER — Encounter (HOSPITAL_COMMUNITY): Payer: Self-pay | Admitting: Emergency Medicine

## 2014-06-18 ENCOUNTER — Observation Stay (HOSPITAL_COMMUNITY)
Admission: EM | Admit: 2014-06-18 | Discharge: 2014-06-20 | Disposition: A | Discharge status: Home / Self Care | Payer: Medicare Other | Attending: Cardiology | Admitting: Cardiology

## 2014-06-18 ENCOUNTER — Emergency Department (HOSPITAL_COMMUNITY): Payer: Medicare Other

## 2014-06-18 DIAGNOSIS — Z6841 Body Mass Index (BMI) 40.0 and over, adult: Secondary | ICD-10-CM | POA: Diagnosis not present

## 2014-06-18 DIAGNOSIS — I369 Nonrheumatic tricuspid valve disorder, unspecified: Secondary | ICD-10-CM | POA: Diagnosis not present

## 2014-06-18 DIAGNOSIS — J45909 Unspecified asthma, uncomplicated: Secondary | ICD-10-CM | POA: Insufficient documentation

## 2014-06-18 DIAGNOSIS — G894 Chronic pain syndrome: Secondary | ICD-10-CM | POA: Insufficient documentation

## 2014-06-18 DIAGNOSIS — Z7901 Long term (current) use of anticoagulants: Secondary | ICD-10-CM | POA: Diagnosis not present

## 2014-06-18 DIAGNOSIS — R0600 Dyspnea, unspecified: Secondary | ICD-10-CM | POA: Diagnosis not present

## 2014-06-18 DIAGNOSIS — M109 Gout, unspecified: Secondary | ICD-10-CM | POA: Insufficient documentation

## 2014-06-18 DIAGNOSIS — N521 Erectile dysfunction due to diseases classified elsewhere: Secondary | ICD-10-CM

## 2014-06-18 DIAGNOSIS — R9431 Abnormal electrocardiogram [ECG] [EKG]: Secondary | ICD-10-CM

## 2014-06-18 DIAGNOSIS — I4891 Unspecified atrial fibrillation: Secondary | ICD-10-CM | POA: Insufficient documentation

## 2014-06-18 DIAGNOSIS — I517 Cardiomegaly: Secondary | ICD-10-CM | POA: Insufficient documentation

## 2014-06-18 DIAGNOSIS — I2510 Atherosclerotic heart disease of native coronary artery without angina pectoris: Secondary | ICD-10-CM | POA: Insufficient documentation

## 2014-06-18 DIAGNOSIS — I482 Chronic atrial fibrillation, unspecified: Secondary | ICD-10-CM

## 2014-06-18 DIAGNOSIS — Z888 Allergy status to other drugs, medicaments and biological substances status: Secondary | ICD-10-CM | POA: Insufficient documentation

## 2014-06-18 DIAGNOSIS — J449 Chronic obstructive pulmonary disease, unspecified: Secondary | ICD-10-CM | POA: Diagnosis not present

## 2014-06-18 DIAGNOSIS — R0602 Shortness of breath: Principal | ICD-10-CM | POA: Insufficient documentation

## 2014-06-18 DIAGNOSIS — E1140 Type 2 diabetes mellitus with diabetic neuropathy, unspecified: Secondary | ICD-10-CM | POA: Insufficient documentation

## 2014-06-18 DIAGNOSIS — M479 Spondylosis, unspecified: Secondary | ICD-10-CM | POA: Insufficient documentation

## 2014-06-18 DIAGNOSIS — H269 Unspecified cataract: Secondary | ICD-10-CM | POA: Insufficient documentation

## 2014-06-18 DIAGNOSIS — J811 Chronic pulmonary edema: Secondary | ICD-10-CM | POA: Diagnosis not present

## 2014-06-18 DIAGNOSIS — K219 Gastro-esophageal reflux disease without esophagitis: Secondary | ICD-10-CM | POA: Diagnosis not present

## 2014-06-18 DIAGNOSIS — Z79899 Other long term (current) drug therapy: Secondary | ICD-10-CM | POA: Insufficient documentation

## 2014-06-18 DIAGNOSIS — I5033 Acute on chronic diastolic (congestive) heart failure: Secondary | ICD-10-CM

## 2014-06-18 DIAGNOSIS — I10 Essential (primary) hypertension: Secondary | ICD-10-CM | POA: Diagnosis not present

## 2014-06-18 DIAGNOSIS — R079 Chest pain, unspecified: Secondary | ICD-10-CM | POA: Diagnosis not present

## 2014-06-18 DIAGNOSIS — G4733 Obstructive sleep apnea (adult) (pediatric): Secondary | ICD-10-CM | POA: Diagnosis not present

## 2014-06-18 DIAGNOSIS — I4581 Long QT syndrome: Secondary | ICD-10-CM

## 2014-06-18 DIAGNOSIS — I25759 Atherosclerosis of native coronary artery of transplanted heart with unspecified angina pectoris: Secondary | ICD-10-CM

## 2014-06-18 DIAGNOSIS — I129 Hypertensive chronic kidney disease with stage 1 through stage 4 chronic kidney disease, or unspecified chronic kidney disease: Secondary | ICD-10-CM | POA: Diagnosis not present

## 2014-06-18 DIAGNOSIS — E785 Hyperlipidemia, unspecified: Secondary | ICD-10-CM

## 2014-06-18 LAB — GLUCOSE, CAPILLARY
Glucose-Capillary: 140 mg/dL — ABNORMAL HIGH (ref 70–99)
Glucose-Capillary: 143 mg/dL — ABNORMAL HIGH (ref 70–99)

## 2014-06-18 LAB — BASIC METABOLIC PANEL
ANION GAP: 14 (ref 5–15)
BUN: 21 mg/dL (ref 6–23)
CO2: 24 mEq/L (ref 19–32)
Calcium: 8.7 mg/dL (ref 8.4–10.5)
Chloride: 105 mEq/L (ref 96–112)
Creatinine, Ser: 1.43 mg/dL — ABNORMAL HIGH (ref 0.50–1.35)
GFR calc non Af Amer: 52 mL/min — ABNORMAL LOW (ref >90)
GFR, EST AFRICAN AMERICAN: 60 mL/min — AB (ref >90)
Glucose, Bld: 158 mg/dL — ABNORMAL HIGH (ref 70–99)
POTASSIUM: 3.7 meq/L (ref 3.7–5.3)
Sodium: 143 mEq/L (ref 137–147)

## 2014-06-18 LAB — CBC WITH DIFFERENTIAL/PLATELET
BASOS ABS: 0 10*3/uL (ref 0.0–0.1)
Basophils Relative: 0 % (ref 0–1)
Eosinophils Absolute: 0.2 10*3/uL (ref 0.0–0.7)
Eosinophils Relative: 3 % (ref 0–5)
HCT: 40.4 % (ref 39.0–52.0)
Hemoglobin: 12.9 g/dL — ABNORMAL LOW (ref 13.0–17.0)
LYMPHS ABS: 1.2 10*3/uL (ref 0.7–4.0)
Lymphocytes Relative: 21 % (ref 12–46)
MCH: 30.2 pg (ref 26.0–34.0)
MCHC: 31.9 g/dL (ref 30.0–36.0)
MCV: 94.6 fL (ref 78.0–100.0)
Monocytes Absolute: 0.3 10*3/uL (ref 0.1–1.0)
Monocytes Relative: 6 % (ref 3–12)
NEUTROS PCT: 70 % (ref 43–77)
Neutro Abs: 4 10*3/uL (ref 1.7–7.7)
PLATELETS: 191 10*3/uL (ref 150–400)
RBC: 4.27 MIL/uL (ref 4.22–5.81)
RDW: 14.7 % (ref 11.5–15.5)
WBC: 5.7 10*3/uL (ref 4.0–10.5)

## 2014-06-18 LAB — TSH: TSH: 2 u[IU]/mL (ref 0.350–4.500)

## 2014-06-18 LAB — URINALYSIS, ROUTINE W REFLEX MICROSCOPIC
BILIRUBIN URINE: NEGATIVE
Glucose, UA: NEGATIVE mg/dL
KETONES UR: NEGATIVE mg/dL
Leukocytes, UA: NEGATIVE
NITRITE: NEGATIVE
Protein, ur: >300 mg/dL — AB
Specific Gravity, Urine: 1.018 (ref 1.005–1.030)
UROBILINOGEN UA: 0.2 mg/dL (ref 0.0–1.0)
pH: 5.5 (ref 5.0–8.0)

## 2014-06-18 LAB — PROTIME-INR
INR: 3.71 — AB (ref 0.00–1.49)
Prothrombin Time: 37.1 seconds — ABNORMAL HIGH (ref 11.6–15.2)

## 2014-06-18 LAB — URINE MICROSCOPIC-ADD ON

## 2014-06-18 LAB — TROPONIN I: Troponin I: <0.3 ng/mL (ref <0.30)

## 2014-06-18 LAB — PRO B NATRIURETIC PEPTIDE: Pro B Natriuretic peptide (BNP): 1148 pg/mL — ABNORMAL HIGH (ref 0–125)

## 2014-06-18 MED ORDER — METOPROLOL TARTRATE 100 MG PO TABS
100.0000 mg | ORAL_TABLET | Freq: Two times a day (BID) | ORAL | Status: DC
  Administered 2014-06-18 – 2014-06-19 (×3): 100 mg via ORAL
  Filled 2014-06-18 (×4): qty 1

## 2014-06-18 MED ORDER — FUROSEMIDE 10 MG/ML IJ SOLN
80.0000 mg | Freq: Once | INTRAMUSCULAR | Status: AC
  Administered 2014-06-18: 80 mg via INTRAVENOUS
  Filled 2014-06-18: qty 8

## 2014-06-18 MED ORDER — MELOXICAM 15 MG PO TABS
15.0000 mg | ORAL_TABLET | Freq: Every day | ORAL | Status: DC | PRN
  Filled 2014-06-18: qty 1

## 2014-06-18 MED ORDER — ALBUTEROL SULFATE HFA 108 (90 BASE) MCG/ACT IN AERS: 2.0000 | INHALATION_SPRAY | Freq: Four times a day (QID) | RESPIRATORY_TRACT | Status: DC | PRN

## 2014-06-18 MED ORDER — OXYCODONE HCL 5 MG PO TABS
5.0000 mg | ORAL_TABLET | Freq: Four times a day (QID) | ORAL | Status: DC | PRN
  Administered 2014-06-18 – 2014-06-19 (×2): 5 mg via ORAL
  Filled 2014-06-18 (×2): qty 1

## 2014-06-18 MED ORDER — GUAIFENESIN-CODEINE 100-10 MG/5ML PO SOLN
5.0000 mL | Freq: Four times a day (QID) | ORAL | Status: DC | PRN
  Administered 2014-06-18 – 2014-06-20 (×4): 5 mL via ORAL
  Filled 2014-06-18 (×4): qty 5

## 2014-06-18 MED ORDER — DICLOFENAC SODIUM 50 MG PO TBEC
50.0000 mg | DELAYED_RELEASE_TABLET | Freq: Two times a day (BID) | ORAL | Status: DC
  Administered 2014-06-18 – 2014-06-20 (×5): 50 mg via ORAL
  Filled 2014-06-18 (×6): qty 1

## 2014-06-18 MED ORDER — FENOFIBRATE 160 MG PO TABS
160.0000 mg | ORAL_TABLET | Freq: Every day | ORAL | Status: DC
  Administered 2014-06-18 – 2014-06-20 (×3): 160 mg via ORAL
  Filled 2014-06-18 (×3): qty 1

## 2014-06-18 MED ORDER — ATORVASTATIN CALCIUM 10 MG PO TABS
10.0000 mg | ORAL_TABLET | Freq: Every day | ORAL | Status: DC
  Administered 2014-06-18 – 2014-06-19 (×2): 10 mg via ORAL
  Filled 2014-06-18 (×3): qty 1

## 2014-06-18 MED ORDER — LATANOPROST 0.005 % OP SOLN
1.0000 [drp] | Freq: Every day | OPHTHALMIC | Status: DC
  Administered 2014-06-18 – 2014-06-19 (×2): 1 [drp] via OPHTHALMIC
  Filled 2014-06-18: qty 2.5

## 2014-06-18 MED ORDER — OXYCODONE-ACETAMINOPHEN 5-325 MG PO TABS
1.0000 | ORAL_TABLET | Freq: Four times a day (QID) | ORAL | Status: DC | PRN
  Administered 2014-06-18 – 2014-06-19 (×2): 1 via ORAL
  Filled 2014-06-18 (×2): qty 1

## 2014-06-18 MED ORDER — FUROSEMIDE 10 MG/ML IJ SOLN
40.0000 mg | Freq: Two times a day (BID) | INTRAMUSCULAR | Status: DC
Start: 2014-06-18 — End: 2014-06-18

## 2014-06-18 MED ORDER — LORATADINE 10 MG PO TABS
10.0000 mg | ORAL_TABLET | Freq: Every day | ORAL | Status: DC | PRN
  Filled 2014-06-18: qty 1

## 2014-06-18 MED ORDER — ALLOPURINOL 300 MG PO TABS
300.0000 mg | ORAL_TABLET | Freq: Every day | ORAL | Status: DC
  Administered 2014-06-18 – 2014-06-20 (×3): 300 mg via ORAL
  Filled 2014-06-18 (×3): qty 1

## 2014-06-18 MED ORDER — SODIUM CHLORIDE 0.9 % IJ SOLN
3.0000 mL | Freq: Two times a day (BID) | INTRAMUSCULAR | Status: DC
  Administered 2014-06-18 – 2014-06-19 (×4): 3 mL via INTRAVENOUS

## 2014-06-18 MED ORDER — TERAZOSIN HCL 5 MG PO CAPS
5.0000 mg | ORAL_CAPSULE | Freq: Every day | ORAL | Status: DC
  Administered 2014-06-18 – 2014-06-19 (×2): 5 mg via ORAL
  Filled 2014-06-18 (×3): qty 1

## 2014-06-18 MED ORDER — NITROGLYCERIN 0.4 MG SL SUBL: 0.4000 mg | SUBLINGUAL_TABLET | SUBLINGUAL | Status: DC | PRN

## 2014-06-18 MED ORDER — IRBESARTAN 300 MG PO TABS
300.0000 mg | ORAL_TABLET | Freq: Every day | ORAL | Status: DC
  Administered 2014-06-18 – 2014-06-20 (×3): 300 mg via ORAL
  Filled 2014-06-18 (×3): qty 1

## 2014-06-18 MED ORDER — ONDANSETRON HCL 4 MG/2ML IJ SOLN: 4.0000 mg | Freq: Four times a day (QID) | INTRAMUSCULAR | Status: DC | PRN

## 2014-06-18 MED ORDER — SODIUM CHLORIDE 0.9 % IJ SOLN: 3.0000 mL | INTRAMUSCULAR | Status: DC | PRN

## 2014-06-18 MED ORDER — PANTOPRAZOLE SODIUM 40 MG PO TBEC
40.0000 mg | DELAYED_RELEASE_TABLET | Freq: Every day | ORAL | Status: DC
  Administered 2014-06-18 – 2014-06-20 (×3): 40 mg via ORAL
  Filled 2014-06-18 (×2): qty 1

## 2014-06-18 MED ORDER — SODIUM CHLORIDE 0.9 % IV SOLN: 250.0000 mL | INTRAVENOUS | Status: DC | PRN

## 2014-06-18 MED ORDER — BUDESONIDE-FORMOTEROL FUMARATE 160-4.5 MCG/ACT IN AERO
2.0000 | INHALATION_SPRAY | Freq: Two times a day (BID) | RESPIRATORY_TRACT | Status: DC
  Administered 2014-06-18 – 2014-06-20 (×4): 2 via RESPIRATORY_TRACT
  Filled 2014-06-18: qty 6

## 2014-06-18 MED ORDER — OXYCODONE-ACETAMINOPHEN 10-325 MG PO TABS: 1.0000 | ORAL_TABLET | Freq: Four times a day (QID) | ORAL | Status: DC | PRN

## 2014-06-18 MED ORDER — ACETAMINOPHEN 325 MG PO TABS: 650.0000 mg | ORAL_TABLET | ORAL | Status: DC | PRN

## 2014-06-18 MED ORDER — ALBUTEROL SULFATE (2.5 MG/3ML) 0.083% IN NEBU: 2.5000 mg | INHALATION_SOLUTION | Freq: Four times a day (QID) | RESPIRATORY_TRACT | Status: DC | PRN

## 2014-06-18 NOTE — ED Provider Notes (Signed)
CSN: [id number]     Arrival date & time 06/18/14  0425 History   First MD Initiated Contact with Patient 06/18/14 [phone]     Chief Complaint  Patient presents with  . Cough  . Shortness of Breath     (Consider location/radiation/quality/duration/timing/severity/associated sxs/prior Treatment) Patient is a 58 y.o. male presenting with shortness of breath.  Shortness of Breath Severity:  Moderate Onset quality:  Gradual Duration:  1 week Timing:  Constant Progression:  Worsening Chronicity:  Recurrent Context: not URI   Relieved by:  Nothing Worsened by:  Deep breathing Ineffective treatments:  None tried Associated symptoms: chest pain (popping sensation this am as precipitant for visit)   Associated symptoms: no abdominal pain, no cough, no fever, no headaches and no sputum production     Past Medical History  Diagnosis Date  . Essential hypertension 09/20/2011  . Atrial fibrillation 02/04/2012  . Chronic diastolic heart failure 5/0/0370    with mild left ventricular hypertrophy on Echo 02/2010  . Chronic pain syndrome 01/15/2013    Left arm and leg s/p traumatic injury   . Osteoarthritis cervical spine 04/25/2013  . Gastroesophageal reflux disease 04/25/2013  . Open-angle glaucoma 04/25/2013  . 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 04/25/2013  . Hyperlipidemia LDL goal < 100 04/25/2013  . Coronary artery disease 04/25/2013    25% LAD stenosis on cath 2007.  Stable angina.  . Asthma, chronic 04/25/2013    Clinical diagnosis  . Gout 04/25/2013  . Right rotator cuff tear 04/25/2013    Large full-thickness tear of the supraspinatus with mild retraction but no atrophy   . Vasomotor rhinitis 04/25/2013  . Obstructive sleep apnea 06/01/2013    Moderate, AHI 29.8 per hour with moderately loud snoring and oxygen desaturation to a nadir of 79%. CPAP titration resulted in a prescription for 17 CWP.    [name] Ulcer     History of ulcer in the remote past  .  Cataract     Left eye  . Onychomycosis of toenail 12/24/2012    Left great toe   . Morbid obesity with BMI of 40.0-44.9, adult 04/25/2013  . Blood transfusion without reported diagnosis   . Chronic obstructive pulmonary disease 04/25/2013  . Diverticulosis 11/12/2013  . Osteoarthritis of left knee 06/19/2013    Tricompartmental disease.  Treated with double hinged upright knee brace, steroid/xylocaine knee injections, and NSAIDs    Past Surgical History  Procedure Laterality Date  . Left leg surgery    . Left arm surgery    . Shoulder surgery      Right  . Fracture surgery Left 1980's    Elbow   Family History  Problem Relation Age of Onset  . Heart failure Mother   . Alzheimer's disease Father   . Healthy Sister   . Healthy Brother   . Healthy Son   . Healthy Sister   . Healthy Sister   . Healthy Sister   . Healthy Sister   . Healthy Brother   . Healthy Brother   . Healthy Brother   . Heart failure Brother   . Healthy Brother   . Osteoarthritis Brother   . Prostate cancer Brother   . Early death Brother     Gun Shot Wound   History  Substance Use Topics  . Smoking status: Never Smoker   . Smokeless tobacco: Never Used  . Alcohol Use: 1.0 oz/week    2 drink(s) per week  Comment: Liquor.    Review of Systems  Constitutional: Negative for fever.  Respiratory: Positive for shortness of breath. Negative for cough and sputum production.   Cardiovascular: Positive for chest pain (popping sensation this am as precipitant for visit).  Gastrointestinal: Negative for abdominal pain.  Neurological: Negative for headaches.  All other systems reviewed and are negative.     Allergies  Ramipril and Testosterone  Home Medications   Prior to Admission medications   Medication Sig Start Date End Date Taking? Authorizing Provider  albuterol (PROAIR HFA) 108 (90 BASE) MCG/ACT inhaler Inhale 2 puffs into the lungs every 6 (six) hours as needed for wheezing or shortness  of breath.   Yes Karren Cobble, MD  allopurinol (ZYLOPRIM) 300 MG tablet Take 1 tablet (300 mg total) by mouth daily. 02/11/14  Yes Karren Cobble, MD  atorvastatin (LIPITOR) 10 MG tablet Take 1 tablet (10 mg total) by mouth daily. 05/20/14  Yes Karren Cobble, MD  budesonide-formoterol [facility]) 160-4.5 MCG/ACT inhaler Inhale 2 puffs into the lungs 2 (two) times daily. 02/11/14  Yes Karren Cobble, MD  diclofenac (VOLTAREN) 50 MG EC tablet Take 1 tablet (50 mg total) by mouth 2 (two) times daily. 02/11/14  Yes Karren Cobble, MD  digoxin (LANOXIN) 0.125 MG tablet Take 0.125 mg by mouth daily.   Yes Historical Provider, MD  esomeprazole (NEXIUM) 40 MG capsule Take 1 capsule (40 mg total) by mouth daily at 12 noon. 02/11/14  Yes Karren Cobble, MD  fenofibrate 160 MG tablet Take 1 tablet (160 mg total) by mouth daily. 02/11/14  Yes Karren Cobble, MD  furosemide (LASIX) 80 MG tablet Take 1 tablet (80 mg total) by mouth daily. 05/13/14  Yes Karren Cobble, MD  latanoprost (XALATAN) 0.005 % ophthalmic solution Place 1 drop into both eyes at bedtime. 02/11/14  Yes Karren Cobble, MD  loratadine (CLARITIN) 10 MG tablet Take 1 tablet (10 mg total) by mouth daily as needed for allergies.   Yes Karren Cobble, MD  meloxicam (MOBIC) 15 MG tablet Take 1 tablet (15 mg total) by mouth daily as needed for pain. 05/13/14  Yes Karren Cobble, MD  metoprolol (LOPRESSOR) 100 MG tablet Take 1 tablet (100 mg total) by mouth 2 (two) times daily. 08/08/13  Yes Karren Cobble, MD  nitroGLYCERIN (NITROSTAT) 0.4 MG SL tablet Place 0.4 mg under the tongue every 5 (five) minutes as needed for chest pain.   Yes Historical Provider, MD  oxyCODONE-acetaminophen (PERCOCET) 10-325 MG per tablet Take 1 tablet by mouth every 6 (six) hours as needed for pain. 05/28/14 07/31/14 Yes Bartholomew Crews, MD  terazosin (HYTRIN) 5 MG capsule Take 1 capsule (5 mg total) by mouth at bedtime. 06/19/13  Yes Karren Cobble, MD   valsartan (DIOVAN) 320 MG tablet Take 1 tablet (320 mg total) by mouth daily. 05/13/14  Yes Karren Cobble, MD  warfarin (COUMADIN) 5 MG tablet One-half tablet (2.5 mg) daily except 1 tablet (5 mg) on Sundays, Tuesdays, and Thursdays. 06/14/14  Yes Karren Cobble, MD   BP 119/92  Pulse 87  Temp(Src) 97.9 F (36.6 C) (Oral)  Resp 23  Ht 5\' 11"  (1.803 m)  Wt 309 lb (140.161 kg)  BMI 43.12 kg/m2  SpO2 96% Physical Exam  Vitals reviewed. Constitutional: He is oriented to person, place, and time. He appears well-developed and well-nourished.  HENT:  Head: Normocephalic and atraumatic.  Eyes: Conjunctivae and EOM are normal.  Neck: Normal range of motion. Neck supple.  Cardiovascular: Normal rate, regular rhythm and normal heart sounds.   Pulmonary/Chest: Accessory muscle usage present. Tachypnea noted. He has rales in the right lower field and the left lower field. He exhibits no tenderness and no bony tenderness.  Abdominal: He exhibits no distension. There is no tenderness. There is no rebound and no guarding.  Musculoskeletal: Normal range of motion.  Neurological: He is alert and oriented to person, place, and time.  Skin: Skin is warm and dry.    ED Course  Procedures (including critical care time) Labs Review Labs Reviewed  CBC WITH DIFFERENTIAL - Abnormal; Notable for the following:    Hemoglobin 12.9 (*)    All other components within normal limits  BASIC METABOLIC PANEL - Abnormal; Notable for the following:    Glucose, Bld 158 (*)    Creatinine, Ser 1.43 (*)    GFR calc non Af Amer 52 (*)    GFR calc Af Amer 60 (*)    All other components within normal limits  PROTIME-INR - Abnormal; Notable for the following:    Prothrombin Time 37.1 (*)    INR 3.71 (*)    All other components within normal limits  URINALYSIS, ROUTINE W REFLEX MICROSCOPIC - Abnormal; Notable for the following:    APPearance CLOUDY (*)    Hgb urine dipstick TRACE (*)    Protein, ur >300 (*)     All other components within normal limits  PRO B NATRIURETIC PEPTIDE - Abnormal; Notable for the following:    Pro B Natriuretic peptide (BNP) 1148.0 (*)    All other components within normal limits  URINE MICROSCOPIC-ADD ON - Abnormal; Notable for the following:    Casts HYALINE CASTS (*)    All other components within normal limits  TROPONIN I    Imaging Review Dg Chest 2 View  06/18/2014   CLINICAL DATA:  Cough and shortness of breath.  Initial encounter.  EXAM: CHEST  2 VIEW  COMPARISON:  09/21/2013.  FINDINGS: There is chronic cardiomegaly. Upper mediastinal contours are stable. There is diffuse interstitial coarsening which is similar to previous, likely congestive. No edema, pneumonia, effusion, or pneumothorax.  IMPRESSION: Cardiomegaly and mild venous congestion.   Electronically Signed   By: Jorje Guild M.D.   On: 06/18/2014 06:15     EKG Interpretation None      MDM   Final diagnoses:  Dyspnea    58 y.o. male with pertinent PMH of Afib, dCHF, asthma, CAD presents with dyspnea times one week. Patient denies infectious symptoms including fever, cough, GI symptoms.  Primary precipitant for visit today was the patient felt a popping sensation in his left chest wall with breathing. On Arrival today vitals signs and physical exam as above. No chest wall tenderness at this time. Patient does appear to have a mild amount of respiratory distress.  Labs as above with elevated BNP 1100, bibasilar rales.  Consulted cardiology for admission.  1. Dyspnea         Debby Freiberg, MD 06/18/14 [phone]

## 2014-06-18 NOTE — ED Notes (Signed)
Cardiology at bedside.

## 2014-06-18 NOTE — ED Notes (Signed)
Pt reports cough and SOB x 1 week. Pt report left lower cp after hearing something pop. Pain is worse on palpation.

## 2014-06-18 NOTE — Progress Notes (Signed)
  Echocardiogram 2D Echocardiogram has been performed.  Juan Calderon 06/18/2014, 4:09 PM

## 2014-06-18 NOTE — ED Notes (Signed)
Attempted report x1. 

## 2014-06-18 NOTE — ED Notes (Signed)
Pt belongings sent with pt to [facility] in a belongings bag

## 2014-06-18 NOTE — ED Notes (Signed)
Patient transported to X-ray 

## 2014-06-18 NOTE — Progress Notes (Signed)
ANTICOAGULATION CONSULT NOTE - Initial Consult  Pharmacy Consult for coumadin Indication: atrial fibrillation  Allergies  Allergen Reactions  . Ramipril Swelling  . Testosterone Rash    Patient Measurements: Height: 5\' 11"  (180.3 cm) Weight: 306 lb 3.5 oz (138.9 kg) IBW/kg (Calculated) : 75.3  Vital Signs: Temp: 98 F (36.7 C) (10/16 1155) Temp Source: Oral (10/16 1155) BP: 135/87 mmHg (10/16 1155) Pulse Rate: 75 (10/16 1155)  Labs:  Recent Labs  06/18/14 0534  HGB 12.9*  HCT 40.4  PLT 191  LABPROT 37.1*  INR 3.71*  CREATININE 1.43*  TROPONINI <0.30    Estimated Creatinine Clearance: 79.2 ml/min (by C-G formula based on Cr of 1.43).   Medical History: Past Medical History  Diagnosis Date  . Essential hypertension 09/20/2011  . Atrial fibrillation 02/04/2012  . Chronic diastolic heart failure 01/03/8412    with mild left ventricular hypertrophy on Echo 02/2010  . Chronic pain syndrome 01/15/2013    Left arm and leg s/p traumatic injury   . Osteoarthritis cervical spine 04/25/2013  . Gastroesophageal reflux disease 04/25/2013  . Open-angle glaucoma 04/25/2013  . 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 04/25/2013  . Hyperlipidemia LDL goal < 100 04/25/2013  . Coronary artery disease 04/25/2013    25% LAD stenosis on cath 2007.  Stable angina.  . Asthma, chronic 04/25/2013    Clinical diagnosis  . Gout 04/25/2013  . Right rotator cuff tear 04/25/2013    Large full-thickness tear of the supraspinatus with mild retraction but no atrophy   . Vasomotor rhinitis 04/25/2013  . Obstructive sleep apnea 06/01/2013    Moderate, AHI 29.8 per hour with moderately loud snoring and oxygen desaturation to a nadir of 79%. CPAP titration resulted in a prescription for 17 CWP.    [name] Ulcer     History of ulcer in the remote past  . Cataract     Left eye  . Onychomycosis of toenail 12/24/2012    Left great toe   . Morbid obesity with BMI of 40.0-44.9, adult  04/25/2013  . Blood transfusion without reported diagnosis   . Chronic obstructive pulmonary disease 04/25/2013  . Diverticulosis 11/12/2013  . Osteoarthritis of left knee 06/19/2013    Tricompartmental disease.  Treated with double hinged upright knee brace, steroid/xylocaine knee injections, and NSAIDs     Medications:  Prescriptions prior to admission  Medication Sig Dispense Refill  . albuterol (PROAIR HFA) 108 (90 BASE) MCG/ACT inhaler Inhale 2 puffs into the lungs every 6 (six) hours as needed for wheezing or shortness of breath.  17 each  3  . allopurinol (ZYLOPRIM) 300 MG tablet Take 1 tablet (300 mg total) by mouth daily.  90 tablet  3  . atorvastatin (LIPITOR) 10 MG tablet Take 1 tablet (10 mg total) by mouth daily.  90 tablet  3  . budesonide-formoterol (SYMBICORT) 160-4.5 MCG/ACT inhaler Inhale 2 puffs into the lungs 2 (two) times daily.  1 Inhaler  12  . diclofenac (VOLTAREN) 50 MG EC tablet Take 1 tablet (50 mg total) by mouth 2 (two) times daily.  180 tablet  3  . digoxin (LANOXIN) 0.125 MG tablet Take 0.125 mg by mouth daily.      [name] esomeprazole (NEXIUM) 40 MG capsule Take 1 capsule (40 mg total) by mouth daily at 12 noon.  90 capsule  3  . fenofibrate 160 MG tablet Take 1 tablet (160 mg total) by mouth daily.  90 tablet  3  . furosemide (LASIX)  80 MG tablet Take 1 tablet (80 mg total) by mouth daily.  90 tablet  3  . latanoprost (XALATAN) 0.005 % ophthalmic solution Place 1 drop into both eyes at bedtime.  7.5 mL  3  . loratadine (CLARITIN) 10 MG tablet Take 1 tablet (10 mg total) by mouth daily as needed for allergies.  90 tablet  3  . meloxicam (MOBIC) 15 MG tablet Take 1 tablet (15 mg total) by mouth daily as needed for pain.  90 tablet  3  . metoprolol (LOPRESSOR) 100 MG tablet Take 1 tablet (100 mg total) by mouth 2 (two) times daily.  180 tablet  3  . nitroGLYCERIN (NITROSTAT) 0.4 MG SL tablet Place 0.4 mg under the tongue every 5 (five) minutes as needed for chest pain.       [name] oxyCODONE-acetaminophen (PERCOCET) 10-325 MG per tablet Take 1 tablet by mouth every 6 (six) hours as needed for pain.  120 tablet  0  . terazosin (HYTRIN) 5 MG capsule Take 1 capsule (5 mg total) by mouth at bedtime.  90 capsule  3  . valsartan (DIOVAN) 320 MG tablet Take 1 tablet (320 mg total) by mouth daily.  90 tablet  3  . warfarin (COUMADIN) 5 MG tablet One-half tablet (2.5 mg) daily except 1 tablet (5 mg) on Sundays, Tuesdays, and Thursdays.  60 tablet  0    Assessment: 59 yo man admitted with SOB to continue coumadin for afib.  His INR on admit is elevated at 3.71.  Hg 12.9  No bleeding noted Goal of Therapy:  INR 2-3 Monitor platelets by anticoagulation protocol: Yes   Plan:  Hold coumadin today Daily PT/INR  Thanks for allowing pharmacy to be a part of this patient's care.  Excell Seltzer, PharmD Clinical Pharmacist, [phone]  06/18/2014,12:42 PM

## 2014-06-18 NOTE — ED Notes (Signed)
Pt states he is not having chest pain, rather it is upper abd pain from when he coughed.

## 2014-06-18 NOTE — Progress Notes (Signed)
Pt.is A/Ox4 and is ambulatory without assistance. He was a new admit from the ED and arrived to the floor transported by stretcher without oxygen and  with IV pump. Paged and notified MD about patient requesting prn medicine for cough and left lower leg/ankle wound. New orders written. He had c/o left upper quadrant abdominal pain and prn po pain medication was given.

## 2014-06-18 NOTE — H&P (Signed)
Patient ID: Juan Calderon MRN: [id number], DOB/AGE: 1956/06/13   Admit date: 06/18/2014   Primary Physician: Karren Cobble, MD Primary Cardiologist: Dr. Angelena Form  Pt. Profile:  CHF, SOB  Problem List  Past Medical History  Diagnosis Date  . Essential hypertension 09/20/2011  . Atrial fibrillation 02/04/2012  . Chronic diastolic heart failure 01/04/6143    with mild left ventricular hypertrophy on Echo 02/2010  . Chronic pain syndrome 01/15/2013    Left arm and leg s/p traumatic injury   . Osteoarthritis cervical spine 04/25/2013  . Gastroesophageal reflux disease 04/25/2013  . Open-angle glaucoma 04/25/2013  . 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 04/25/2013  . Hyperlipidemia LDL goal < 100 04/25/2013  . Coronary artery disease 04/25/2013    25% LAD stenosis on cath 2007.  Stable angina.  . Asthma, chronic 04/25/2013    Clinical diagnosis  . Gout 04/25/2013  . Right rotator cuff tear 04/25/2013    Large full-thickness tear of the supraspinatus with mild retraction but no atrophy   . Vasomotor rhinitis 04/25/2013  . Obstructive sleep apnea 06/01/2013    Moderate, AHI 29.8 per hour with moderately loud snoring and oxygen desaturation to a nadir of 79%. CPAP titration resulted in a prescription for 17 CWP.    [name] Ulcer     History of ulcer in the remote past  . Cataract     Left eye  . Onychomycosis of toenail 12/24/2012    Left great toe   . Morbid obesity with BMI of 40.0-44.9, adult 04/25/2013  . Blood transfusion without reported diagnosis   . Chronic obstructive pulmonary disease 04/25/2013  . Diverticulosis 11/12/2013  . Osteoarthritis of left knee 06/19/2013    Tricompartmental disease.  Treated with double hinged upright knee brace, steroid/xylocaine knee injections, and NSAIDs     Past Surgical History  Procedure Laterality Date  . Left leg surgery    . Left arm surgery    . Shoulder surgery      Right  . Fracture surgery Left 1980's      Elbow     Allergies  Allergies  Allergen Reactions  . Ramipril Swelling  . Testosterone Rash    HPI  58 yo male, patient of Dr Angelena Form, last seen in the clinic on 05/14/2014, with history of diastolic CHF, CAD, paroxysmal AFib, COPD, HTN, HLD, gout, GERD who presented to the ER with with dyspnea times one week. Patient denies infectious symptoms including fever, cough, GI symptoms. Primary precipitant for visit today was the patient felt a popping sensation in his left chest wall with breathing. Patient appeared to be in mild respiratory distress.  He has stable 4 pillow orthopnea, no PND. Chronic LE edema.   Echo 6/11: mild LVH, EF of 55%, mild LAE. Monitor April 2012 in [place], [place] showed NSR with episodes of sinus brady, rare PVCs, occasional PACs and brief episodes of atrial tachycardia. Per records he has been on Multaq in the past but did not like this medication so it was stopped. He had a stress myoview 09/14/05 that showed reversible ischemia in the inferior wall. This led to a cardiac cath on 10/01/05 which showed 25% mid LAD stenosis per report but no other evidence of obstructive CAD. Last Myoview 9/13: No ischemia. He was seen in the emergency room 08/23/12 with complaints of edema. BNP was mildly elevated (360). He was seen several times in f/u by Richardson Dopp, PA-C and not felt to be volume overloaded.  TSH was normal. He was seen in primary care 04/24/13 and his renal function had worsened. Lasix was changed to 60 mg po QDaily and renal function returned to normal. Recent diagnosis of COPD. On 05/14/14 he appeared euvolemic. Cozaar was increased by PCP. On Lasix 80 mg PO daily.  Home Medications  Prior to Admission medications   Medication Sig Start Date End Date Taking? Authorizing Provider  albuterol (PROAIR HFA) 108 (90 BASE) MCG/ACT inhaler Inhale 2 puffs into the lungs every 6 (six) hours as needed for wheezing or shortness of breath.   Yes Karren Cobble, MD   allopurinol (ZYLOPRIM) 300 MG tablet Take 1 tablet (300 mg total) by mouth daily. 02/11/14  Yes Karren Cobble, MD  atorvastatin (LIPITOR) 10 MG tablet Take 1 tablet (10 mg total) by mouth daily. 05/20/14  Yes Karren Cobble, MD  budesonide-formoterol [facility]) 160-4.5 MCG/ACT inhaler Inhale 2 puffs into the lungs 2 (two) times daily. 02/11/14  Yes Karren Cobble, MD  diclofenac (VOLTAREN) 50 MG EC tablet Take 1 tablet (50 mg total) by mouth 2 (two) times daily. 02/11/14  Yes Karren Cobble, MD  digoxin (LANOXIN) 0.125 MG tablet Take 0.125 mg by mouth daily.   Yes Historical Provider, MD  esomeprazole (NEXIUM) 40 MG capsule Take 1 capsule (40 mg total) by mouth daily at 12 noon. 02/11/14  Yes Karren Cobble, MD  fenofibrate 160 MG tablet Take 1 tablet (160 mg total) by mouth daily. 02/11/14  Yes Karren Cobble, MD  furosemide (LASIX) 80 MG tablet Take 1 tablet (80 mg total) by mouth daily. 05/13/14  Yes Karren Cobble, MD  latanoprost (XALATAN) 0.005 % ophthalmic solution Place 1 drop into both eyes at bedtime. 02/11/14  Yes Karren Cobble, MD  loratadine (CLARITIN) 10 MG tablet Take 1 tablet (10 mg total) by mouth daily as needed for allergies.   Yes Karren Cobble, MD  meloxicam (MOBIC) 15 MG tablet Take 1 tablet (15 mg total) by mouth daily as needed for pain. 05/13/14  Yes Karren Cobble, MD  metoprolol (LOPRESSOR) 100 MG tablet Take 1 tablet (100 mg total) by mouth 2 (two) times daily. 08/08/13  Yes Karren Cobble, MD  nitroGLYCERIN (NITROSTAT) 0.4 MG SL tablet Place 0.4 mg under the tongue every 5 (five) minutes as needed for chest pain.   Yes Historical Provider, MD  oxyCODONE-acetaminophen (PERCOCET) 10-325 MG per tablet Take 1 tablet by mouth every 6 (six) hours as needed for pain. 05/28/14 07/31/14 Yes Bartholomew Crews, MD  terazosin (HYTRIN) 5 MG capsule Take 1 capsule (5 mg total) by mouth at bedtime. 06/19/13  Yes Karren Cobble, MD  valsartan (DIOVAN) 320 MG tablet  Take 1 tablet (320 mg total) by mouth daily. 05/13/14  Yes Karren Cobble, MD  warfarin (COUMADIN) 5 MG tablet One-half tablet (2.5 mg) daily except 1 tablet (5 mg) on Sundays, Tuesdays, and Thursdays. 06/14/14  Yes Karren Cobble, MD    Family History  Family History  Problem Relation Age of Onset  . Heart failure Mother   . Alzheimer's disease Father   . Healthy Sister   . Healthy Brother   . Healthy Son   . Healthy Sister   . Healthy Sister   . Healthy Sister   . Healthy Sister   . Healthy Brother   . Healthy Brother   . Healthy Brother   . Heart failure Brother   . Healthy Brother   .  Osteoarthritis Brother   . Prostate cancer Brother   . Early death Brother     Gun Shot Wound    Social History  History   Social History  . Marital Status: Widowed    Spouse Name: N/A    Number of Children: N/A  . Years of Education: N/A   Occupational History  . Not on file.   Social History Main Topics  . Smoking status: Never Smoker   . Smokeless tobacco: Never Used  . Alcohol Use: 1.0 oz/week    2 drink(s) per week     Comment: Liquor.  . Drug Use: No  . Sexual Activity: Yes    Birth Control/ Protection: None   Other Topics Concern  . Not on file   Social History Narrative  . No narrative on file     Review of Systems General:  No chills, fever, night sweats or weight changes.  Cardiovascular:  No chest pain, dyspnea on exertion, edema, orthopnea, palpitations, paroxysmal nocturnal dyspnea. Dermatological: No rash, lesions/masses Respiratory: No cough, dyspnea Urologic: No hematuria, dysuria Abdominal:   No nausea, vomiting, diarrhea, bright red blood per rectum, melena, or hematemesis Neurologic:  No visual changes, wkns, changes in mental status. All other systems reviewed and are otherwise negative except as noted above.  Physical Exam  Blood pressure 119/92, pulse 87, temperature 97.9 F (36.6 C), temperature source Oral, resp. rate 23, height 5\' 11"   (1.803 m), weight 309 lb (140.161 kg), SpO2 96.00%.  General: Pleasant, NAD Psych: Normal affect. Neuro: Alert and oriented X 3. Moves all extremities spontaneously. HEENT: Normal  Neck: Supple without bruits or JVD. Lungs:  Resp regular and unlabored, mild rales at the basis B/L. Heart: RRR no s3, s4, or murmurs. Abdomen: Soft, non-tender, non-distended, BS + x 4.  Extremities: No clubbing, cyanosis, chronic LE edema up to the knees. DP/PT/Radials 2+ and equal bilaterally.  Labs   Recent Labs  06/18/14 0534  TROPONINI <0.30   Lab Results  Component Value Date   WBC 5.7 06/18/2014   HGB 12.9* 06/18/2014   HCT 40.4 06/18/2014   MCV 94.6 06/18/2014   PLT 191 06/18/2014    Recent Labs Lab 06/18/14 0534  NA 143  K 3.7  CL 105  CO2 24  BUN 21  CREATININE 1.43*  CALCIUM 8.7  GLUCOSE 158*   Lab Results  Component Value Date   CHOL 107 05/13/2014   HDL 54 05/13/2014   LDLCALC 25 05/13/2014   TRIG 138 05/13/2014   Radiology/Studies  Dg Chest 2 View  06/18/2014   CLINICAL DATA:  Cough and shortness of breath.  Initial encounter.  EXAM: CHEST  2 VIEW  COMPARISON:  09/21/2013.  FINDINGS: There is chronic cardiomegaly. Upper mediastinal contours are stable. There is diffuse interstitial coarsening which is similar to previous, likely congestive. No edema, pneumonia, effusion, or pneumothorax.  IMPRESSION: Cardiomegaly and mild venous congestion.   Electronically Signed   By: Jorje Guild M.D.   On: 06/18/2014 06:15   Echocardiogram - none since 2011  ECG: a-fib, 105 BPM, PVCs, non-specific T wave abnormalities, long QT 480/634, previously 398/478     ASSESSMENT AND PLAN  1. Acute on chronic Diastolic CHF: Weight is 2 lbs since the last outpatient visit.  He was given Lasix 80 mg iv x 1 in the ER, we will continue with Lasix 40 mg iv BID and monitor Crea closely, currently 1.43, previously 1.1 - 2.4. - also I would discontinue Digoxin considering CHF  pEF and increase  metoprolol  2. Atrial Fibrillation: Rate controlled. He remains on Coumadin. He is followed in our coumadin clinic.   3. Hypertension: Controlled.    4. CAD: Stable. He is not on an ASA since he has been on coumadin. Continue statin, beta blocker. Recommend daily exercise.   5. CKD stage 3 - we might consider Hydralazine/imdur in the future if Crea continues to deteriorate  6. Prolonged QT - might be related to tachycardia, we will continue to monitor  DVT PPX - on Coumadine   Signed, Dorothy Spark, MD, [facility] 06/18/2014, 8:37 AM

## 2014-06-19 DIAGNOSIS — R0602 Shortness of breath: Secondary | ICD-10-CM | POA: Diagnosis not present

## 2014-06-19 DIAGNOSIS — I5033 Acute on chronic diastolic (congestive) heart failure: Secondary | ICD-10-CM | POA: Diagnosis not present

## 2014-06-19 DIAGNOSIS — I482 Chronic atrial fibrillation: Secondary | ICD-10-CM | POA: Diagnosis not present

## 2014-06-19 DIAGNOSIS — G4733 Obstructive sleep apnea (adult) (pediatric): Secondary | ICD-10-CM

## 2014-06-19 DIAGNOSIS — G894 Chronic pain syndrome: Secondary | ICD-10-CM | POA: Diagnosis not present

## 2014-06-19 DIAGNOSIS — I25759 Atherosclerosis of native coronary artery of transplanted heart with unspecified angina pectoris: Secondary | ICD-10-CM | POA: Diagnosis not present

## 2014-06-19 DIAGNOSIS — I129 Hypertensive chronic kidney disease with stage 1 through stage 4 chronic kidney disease, or unspecified chronic kidney disease: Secondary | ICD-10-CM | POA: Diagnosis not present

## 2014-06-19 DIAGNOSIS — E1140 Type 2 diabetes mellitus with diabetic neuropathy, unspecified: Secondary | ICD-10-CM | POA: Diagnosis not present

## 2014-06-19 DIAGNOSIS — R0600 Dyspnea, unspecified: Secondary | ICD-10-CM | POA: Diagnosis not present

## 2014-06-19 LAB — BASIC METABOLIC PANEL
ANION GAP: 12 (ref 5–15)
BUN: 27 mg/dL — ABNORMAL HIGH (ref 6–23)
CALCIUM: 8.6 mg/dL (ref 8.4–10.5)
CO2: 28 meq/L (ref 19–32)
CREATININE: 1.69 mg/dL — AB (ref 0.50–1.35)
Chloride: 101 mEq/L (ref 96–112)
GFR, EST AFRICAN AMERICAN: 49 mL/min — AB (ref >90)
GFR, EST NON AFRICAN AMERICAN: 43 mL/min — AB (ref >90)
Glucose, Bld: 126 mg/dL — ABNORMAL HIGH (ref 70–99)
Potassium: 4.5 mEq/L (ref 3.7–5.3)
Sodium: 141 mEq/L (ref 137–147)

## 2014-06-19 LAB — GLUCOSE, CAPILLARY
Glucose-Capillary: 198 mg/dL — ABNORMAL HIGH (ref 70–99)
Glucose-Capillary: 151 mg/dL — ABNORMAL HIGH (ref 70–99)

## 2014-06-19 LAB — PROTIME-INR
INR: 3.44 — ABNORMAL HIGH (ref 0.00–1.49)
Prothrombin Time: 34.9 seconds — ABNORMAL HIGH (ref 11.6–15.2)

## 2014-06-19 MED ORDER — METOPROLOL TARTRATE 50 MG PO TABS
50.0000 mg | ORAL_TABLET | Freq: Two times a day (BID) | ORAL | Status: DC
  Administered 2014-06-19 – 2014-06-20 (×2): 50 mg via ORAL
  Filled 2014-06-19 (×3): qty 1

## 2014-06-19 NOTE — Progress Notes (Signed)
Patient Name: Juan Calderon Date of Encounter: 06/19/2014  Active Problems:   Acute on chronic diastolic CHF (congestive heart failure), NYHA class 3   Prolonged Q-T interval on ECG   CHF (congestive heart failure)   Length of Stay: 1  SUBJECTIVE  He feels only mild improvement in SOB. Still complains of cough. No dizziness. Hasn't walk yet.  CURRENT MEDS . allopurinol  300 mg Oral Daily  . atorvastatin  10 mg Oral q1800  . budesonide-formoterol  2 puff Inhalation BID  . diclofenac  50 mg Oral BID  . fenofibrate  160 mg Oral Daily  . irbesartan  300 mg Oral Daily  . latanoprost  1 drop Both Eyes QHS  . metoprolol  100 mg Oral BID  . pantoprazole  40 mg Oral Daily  . sodium chloride  3 mL Intravenous Q12H  . terazosin  5 mg Oral QHS   OBJECTIVE  Filed Vitals:   06/18/14 2150 06/19/14 0514 06/19/14 0907 06/19/14 0926  BP:  108/82  116/86  Pulse:  64  85  Temp:  97.3 F (36.3 C)    TempSrc:  Oral    Resp:  18    Height:      Weight:  302 lb (136.986 kg)    SpO2: 97% 94% 96%     Intake/Output Summary (Last 24 hours) at 06/19/14 1019 Last data filed at 06/19/14 0830  Gross per 24 hour  Intake    843 ml  Output   1375 ml  Net   -532 ml   Filed Weights   06/18/14 1155 06/18/14 1226 06/19/14 0514  Weight: 306 lb 3.5 oz (138.9 kg) 306 lb 3.5 oz (138.9 kg) 302 lb (136.986 kg)   PHYSICAL EXAM  General: Pleasant, NAD. Neuro: Alert and oriented X 3. Moves all extremities spontaneously. Psych: Normal affect. HEENT:  Normal  Neck: Supple without bruits or JVD. Lungs:  Resp regular and unlabored, CTA. Heart: RRR no s3, s4, or murmurs. Abdomen: Soft, non-tender, non-distended, BS + x 4.  Extremities: No clubbing, cyanosis or edema. DP/PT/Radials 2+ and equal bilaterally.  Accessory Clinical Findings  CBC  Recent Labs  06/18/14 0534  WBC 5.7  NEUTROABS 4.0  HGB 12.9*  HCT 40.4  MCV 94.6  PLT 102   Basic Metabolic Panel  Recent Labs   06/18/14 0534 06/19/14 0455  NA 143 141  K 3.7 4.5  CL 105 101  CO2 24 28  GLUCOSE 158* 126*  BUN 21 27*  CREATININE 1.43* 1.69*  CALCIUM 8.7 8.6    Recent Labs  06/18/14 0534  TROPONINI <0.30    Recent Labs  06/18/14 1345  TSH 2.000   Radiology/Studies  Dg Chest 2 View  06/18/2014   CLINICAL DATA:  Cough and shortness of breath.  Initial encounter.  EXAM: CHEST  2 VIEW  COMPARISON:  09/21/2013.  FINDINGS: There is chronic cardiomegaly. Upper mediastinal contours are stable. There is diffuse interstitial coarsening which is similar to previous, likely congestive. No edema, pneumonia, effusion, or pneumothorax.  IMPRESSION: Cardiomegaly and mild venous congestion.   Electronically Signed   By: Jorje Guild M.D.   On: 06/18/2014 06:15   TELE: a-fib with slow ventricular rates down to 35 at night, multiple pauses up to 2 seconds.      ASSESSMENT AND PLAN  1. Acute on chronic Diastolic CHF: Weight is 2 lbs since the last outpatient visit.  He was given Lasix 80 mg iv x 1 in  the ER, we will continue with Lasix 40 mg iv BID and monitor Crea closely, worsened from 1.43 to 1.69 (previously 1.1 - 2.4) , we will hold lasix now and reassess in the am, he is down 4 lbs at his dry weight  - also I would discontinue Digoxin considering CHF pEF   2. Atrial Fibrillation: He remains on Coumadin. He is followed in our coumadin clinic. Slow ventricular rate down to 35 especially at night, we will decrease metoprolol to 50 mg po BID.  3. Hypertension: Controlled.   4. CAD: Stable. He is not on an ASA since he has been on coumadin. Continue statin, beta blocker. Recommend daily exercise.   5. CKD stage 3 - we might consider Hydralazine/imdur in the future if Crea continues to deteriorate   6. Prolonged QT - might be related to tachycardia, we will continue to monitor, repeat ECG  DVT PPX - on Coumadine  Signed, Dorothy Spark MD, [facility] 06/19/2014

## 2014-06-19 NOTE — Progress Notes (Signed)
ANTICOAGULATION CONSULT NOTE - Follow Up Consult  Pharmacy Consult for Coumadin Indication: atrial fibrillation  Allergies  Allergen Reactions  . Ramipril Swelling  . Testosterone Rash    Patient Measurements: Height: 5\' 11"  (180.3 cm) Weight: 302 lb (136.986 kg) (scale c) IBW/kg (Calculated) : 75.3 Heparin Dosing Weight:   Vital Signs: Temp: 97.3 F (36.3 C) (10/17 1333) Temp Source: Oral (10/17 1333) BP: 114/82 mmHg (10/17 1333) Pulse Rate: 62 (10/17 1333)  Labs:  Recent Labs  06/18/14 0534 06/19/14 0455 06/19/14 1610  HGB 12.9*  --   --   HCT 40.4  --   --   PLT 191  --   --   LABPROT 37.1*  --  34.9*  INR 3.71*  --  3.44*  CREATININE 1.43* 1.69*  --   TROPONINI <0.30  --   --     Estimated Creatinine Clearance: 66.6 ml/min (by C-G formula based on Cr of 1.69).   Medications:  Scheduled:  . allopurinol  300 mg Oral Daily  . atorvastatin  10 mg Oral q1800  . budesonide-formoterol  2 puff Inhalation BID  . diclofenac  50 mg Oral BID  . fenofibrate  160 mg Oral Daily  . irbesartan  300 mg Oral Daily  . latanoprost  1 drop Both Eyes QHS  . metoprolol  50 mg Oral BID  . pantoprazole  40 mg Oral Daily  . sodium chloride  3 mL Intravenous Q12H  . terazosin  5 mg Oral QHS    Assessment: 58yo male with AFib, admitted with elevated INR on 10/16, which has decreased some to 3.44 but remains supratherapeutic.  There was no CBC today.  No bleeding noted.  Goal of Therapy:  INR 2-3 Monitor platelets by anticoagulation protocol: Yes   Plan:  No Coumadin today F/U in AM  Gracy Bruins, PharmD [facility] '

## 2014-06-19 NOTE — Progress Notes (Signed)
UR completed 

## 2014-06-20 DIAGNOSIS — N521 Erectile dysfunction due to diseases classified elsewhere: Secondary | ICD-10-CM

## 2014-06-20 DIAGNOSIS — R0600 Dyspnea, unspecified: Secondary | ICD-10-CM | POA: Diagnosis not present

## 2014-06-20 DIAGNOSIS — I25759 Atherosclerosis of native coronary artery of transplanted heart with unspecified angina pectoris: Secondary | ICD-10-CM | POA: Diagnosis not present

## 2014-06-20 DIAGNOSIS — E1149 Type 2 diabetes mellitus with other diabetic neurological complication: Secondary | ICD-10-CM

## 2014-06-20 DIAGNOSIS — R0602 Shortness of breath: Secondary | ICD-10-CM | POA: Diagnosis not present

## 2014-06-20 DIAGNOSIS — I5033 Acute on chronic diastolic (congestive) heart failure: Secondary | ICD-10-CM | POA: Diagnosis not present

## 2014-06-20 DIAGNOSIS — I482 Chronic atrial fibrillation: Secondary | ICD-10-CM | POA: Diagnosis not present

## 2014-06-20 LAB — BASIC METABOLIC PANEL
Anion gap: 11 (ref 5–15)
BUN: 26 mg/dL — ABNORMAL HIGH (ref 6–23)
CALCIUM: 8.7 mg/dL (ref 8.4–10.5)
CO2: 29 mEq/L (ref 19–32)
Chloride: 101 mEq/L (ref 96–112)
Creatinine, Ser: 1.45 mg/dL — ABNORMAL HIGH (ref 0.50–1.35)
GFR, EST AFRICAN AMERICAN: 59 mL/min — AB (ref >90)
GFR, EST NON AFRICAN AMERICAN: 51 mL/min — AB (ref >90)
GLUCOSE: 118 mg/dL — AB (ref 70–99)
Potassium: 4.2 mEq/L (ref 3.7–5.3)
SODIUM: 141 meq/L (ref 137–147)

## 2014-06-20 LAB — PROTIME-INR
INR: 3.63 — ABNORMAL HIGH (ref 0.00–1.49)
Prothrombin Time: 36.4 seconds — ABNORMAL HIGH (ref 11.6–15.2)

## 2014-06-20 MED ORDER — GUAIFENESIN-CODEINE 100-10 MG/5ML PO SOLN: 5.0000 mL | Freq: Four times a day (QID) | ORAL | Status: DC | PRN

## 2014-06-20 MED ORDER — METOPROLOL TARTRATE 50 MG PO TABS: 50.0000 mg | ORAL_TABLET | Freq: Two times a day (BID) | ORAL | Status: DC

## 2014-06-20 NOTE — Progress Notes (Signed)
Pt d/c'd to home with family. D/c instructions given to and d/w pt. Pt verbalizes understanding

## 2014-06-20 NOTE — Discharge Summary (Signed)
Physician Discharge Summary  Patient ID: Juan Calderon MRN: [id number] DOB/AGE: October 22, 1955 58 y.o.  Admit date: 06/18/2014 Discharge date: 06/20/2014  Primary Cardiologist: Dr. Angelena Form  Admission Diagnoses: Acute on Chronic Diastolic CHF exacerbation  Discharge Diagnoses:  Active Problems:   Acute on chronic diastolic CHF (congestive heart failure), NYHA class 3   Prolonged Q-T interval on ECG   CHF (congestive heart failure)   Discharged Condition: stable  Hospital Course: the patient is a 58 y/o AA male, followed by Dr. Angelena Form, with a history of diastolic CHF (EF 78% w/ mild LAE 02/2010), paroxysmal AFib on Coumadin, COPD, mild nonobstructive CAD with LHC in 2007 revealing only 25% mild LAD stenosis and stress Myoview in 2013 that was low risk, HTN, HLD and gout who presented to [facility] 06/18/14 with complaints of worsening dyspnea x 1 week with 4 pillow orthopnea but no PND. He denied infectious symptoms including fever, cough and GI symptoms.  In the ED, BNP was elevated at 1148.0.  His EKG demonstrated atrial fibrillation with a controlled ventricular response. Troponin was negative. TSH WNL. CXR demonstrated cardiomegaly and mild venous congestion. 2D echo showed normal systolic function with EF at 55%. He was placed on IV diuretic therapy and diuresed back down to dry weight (304 lb). Symptoms improved. He was transitioned back to his home dose of Lasix, 80 mg daily. He was continued on an ARB, however it has been recommended that transition to hydralazine + nitrate may need to be considered in the furture if he has worsening renal function. SCr day of discharge was 1.45. He was continued on his BB for Afib, however metoprolol was reduced down from 100 mg BID to 50 mg BID given nocturnal bradycardia with rates in the 30s. Digoxin was also discontinued.   On hospital day 2, he was last seen and examined by Dr. Meda Coffee. He was euvolemic on physical exam. HR was stable. He had no further  symptoms. Dr. Meda Coffee determined he was stable for discharge home. She did approve that he be prescribed cough medicine (guaiFENesin-codeine w/ no refills) PRN. Post-hospital f/u will be arranged with Dr. Angelena Form or an extender. It has been recommended that a f/u BMET be obtained at time of follow-up.    Consults: None  Significant Diagnostic Studies:   2D Echo 06/18/14 Study Conclusions  - Left ventricle: Technically limited study. The cavity size was mildly dilated. Wall thickness was increased in a pattern of mild LVH. The estimated ejection fraction was 50%. Regional wall motion abnormalities cannot be excluded. - Left atrium: The atrium was moderately dilated. - Right ventricle: The cavity size was mildly to moderately dilated. Systolic function was mildly to moderately reduced. - Right atrium: The atrium was moderately dilated. - Tricuspid valve: There was moderate regurgitation. - Pulmonary arteries: PA peak pressure: 64 mm Hg (S). - Pericardium, extracardiac: A probable, small pericardial effusion was identified posterior to the heart.    Treatments: See Hospital Course  Discharge Exam: Blood pressure 130/96, pulse 95, temperature 97.6 F (36.4 C), temperature source Oral, resp. rate 17, height 5\' 11"  (1.803 m), weight 304 lb 14.4 oz (138.302 kg), SpO2 95.00%.   Disposition: 01-Home or Self Care      Discharge Instructions   Diet - low sodium heart healthy    Complete by:  As directed      Increase activity slowly    Complete by:  As directed             Medication List  STOP taking these medications       digoxin 0.125 MG tablet  Commonly known as:  LANOXIN     meloxicam 15 MG tablet  Commonly known as:  MOBIC      TAKE these medications       albuterol 108 (90 BASE) MCG/ACT inhaler  Commonly known as:  PROAIR HFA  Inhale 2 puffs into the lungs every 6 (six) hours as needed for wheezing or shortness of breath.     allopurinol 300 MG tablet    Commonly known as:  ZYLOPRIM  Take 1 tablet (300 mg total) by mouth daily.     atorvastatin 10 MG tablet  Commonly known as:  LIPITOR  Take 1 tablet (10 mg total) by mouth daily.     budesonide-formoterol 160-4.5 MCG/ACT inhaler  Commonly known as:  SYMBICORT  Inhale 2 puffs into the lungs 2 (two) times daily.     diclofenac 50 MG EC tablet  Commonly known as:  VOLTAREN  Take 1 tablet (50 mg total) by mouth 2 (two) times daily.     esomeprazole 40 MG capsule  Commonly known as:  NEXIUM  Take 1 capsule (40 mg total) by mouth daily at 12 noon.     fenofibrate 160 MG tablet  Take 1 tablet (160 mg total) by mouth daily.     furosemide 80 MG tablet  Commonly known as:  LASIX  Take 1 tablet (80 mg total) by mouth daily.     guaiFENesin-codeine 100-10 MG/5ML syrup  Take 5 mLs by mouth every 6 (six) hours as needed for cough.     latanoprost 0.005 % ophthalmic solution  Commonly known as:  XALATAN  Place 1 drop into both eyes at bedtime.     loratadine 10 MG tablet  Commonly known as:  CLARITIN  Take 1 tablet (10 mg total) by mouth daily as needed for allergies.     metoprolol 50 MG tablet  Commonly known as:  LOPRESSOR  Take 1 tablet (50 mg total) by mouth 2 (two) times daily.     nitroGLYCERIN 0.4 MG SL tablet  Commonly known as:  NITROSTAT  Place 0.4 mg under the tongue every 5 (five) minutes as needed for chest pain.     oxyCODONE-acetaminophen 10-325 MG per tablet  Commonly known as:  PERCOCET  Take 1 tablet by mouth every 6 (six) hours as needed for pain.     terazosin 5 MG capsule  Commonly known as:  HYTRIN  Take 1 capsule (5 mg total) by mouth at bedtime.     valsartan 320 MG tablet  Commonly known as:  DIOVAN  Take 1 tablet (320 mg total) by mouth daily.     warfarin 5 MG tablet  Commonly known as:  COUMADIN  One-half tablet (2.5 mg) daily except 1 tablet (5 mg) on Sundays, Tuesdays, and Thursdays.       Follow-up Information   Follow up with  Lauree Chandler, MD. (our office will call you with an appointment)    Specialty:  Cardiology   Contact information:   [place] [place] [address] [phone]      TIME SPENT ON DISCHARGE, INCLUDING PHYSICIAN TIME: >30 MINUTES  Signed: Lyda Jester 06/20/2014, 11:07 AM   The patient was seen, examined and discussed with Brittainy M. Rosita Fire, PA-C and I agree with the above.   Dorothy Spark 06/20/2014

## 2014-06-20 NOTE — Progress Notes (Signed)
Patient Profile: 58 y/o male with a history of diastolic CHF, CAD, paroxysmal AFib on coumadin, COPD, HTN, HLD, gout, GERD admitted 19/41/74 for A/C diastolic CHF exacerbation.    Subjective: No complaints. Breathing improved.   Objective: Vital signs in last 24 hours: Temp:  [97.3 F (36.3 C)-97.8 F (36.6 C)] 97.6 F (36.4 C) (10/18 0530) Pulse Rate:  [62-95] 95 (10/18 0530) Resp:  [17-19] 17 (10/18 0530) BP: (114-137)/(82-96) 130/96 mmHg (10/18 0530) SpO2:  [95 %-99 %] 95 % (10/18 0530) Weight:  [304 lb 14.4 oz (138.302 kg)] 304 lb 14.4 oz (138.302 kg) (10/18 0530) Last BM Date: 06/18/14  Intake/Output from previous day: 10/17 0701 - 10/18 0700 In: 13 [P.O.:580] Out: 850 [Urine:850] Intake/Output this shift:    Medications Current Facility-Administered Medications  Medication Dose Route Frequency Provider Last Rate Last Dose  . 0.9 %  sodium chloride infusion  250 mL Intravenous PRN Eileen Stanford, PA-C      . acetaminophen (TYLENOL) tablet 650 mg  650 mg Oral Q4H PRN Eileen Stanford, PA-C      . albuterol (PROVENTIL) (2.5 MG/3ML) 0.083% nebulizer solution 2.5 mg  2.5 mg Nebulization Q6H PRN Dorothy Spark, MD      . allopurinol (ZYLOPRIM) tablet 300 mg  300 mg Oral Daily Eileen Stanford, PA-C   300 mg at 06/19/14 0814  . atorvastatin (LIPITOR) tablet 10 mg  10 mg Oral q1800 Eileen Stanford, PA-C   10 mg at 06/19/14 1722  . budesonide-formoterol (SYMBICORT) 160-4.5 MCG/ACT inhaler 2 puff  2 puff Inhalation BID Eileen Stanford, PA-C   2 puff at 06/19/14 2255  . diclofenac (VOLTAREN) EC tablet 50 mg  50 mg Oral BID Eileen Stanford, PA-C   50 mg at 06/19/14 2229  . fenofibrate tablet 160 mg  160 mg Oral Daily Eileen Stanford, PA-C   160 mg at 06/19/14 4818  . guaiFENesin-codeine 100-10 MG/5ML solution 5 mL  5 mL Oral Q6H PRN Dorothy Spark, MD   5 mL at 06/19/14 2242  . irbesartan (AVAPRO) tablet 300 mg  300 mg Oral Daily Eileen Stanford,  PA-C   300 mg at 06/19/14 5631  . latanoprost (XALATAN) 0.005 % ophthalmic solution 1 drop  1 drop Both Eyes QHS Eileen Stanford, PA-C   1 drop at 06/19/14 2231  . loratadine (CLARITIN) tablet 10 mg  10 mg Oral Daily PRN Eileen Stanford, PA-C      . meloxicam [facility]) tablet 15 mg  15 mg Oral Daily PRN Eileen Stanford, PA-C      . metoprolol (LOPRESSOR) tablet 50 mg  50 mg Oral BID Dorothy Spark, MD   50 mg at 06/19/14 2229  . nitroGLYCERIN (NITROSTAT) SL tablet 0.4 mg  0.4 mg Sublingual Q5 min PRN Eileen Stanford, PA-C      . ondansetron [facility]) injection 4 mg  4 mg Intravenous Q6H PRN Eileen Stanford, PA-C      . oxyCODONE-acetaminophen (PERCOCET/ROXICET) 5-325 MG per tablet 1 tablet  1 tablet Oral Q6H PRN Dorothy Spark, MD   1 tablet at 06/19/14 2242   And  . oxyCODONE (Oxy IR/ROXICODONE) immediate release tablet 5 mg  5 mg Oral Q6H PRN Dorothy Spark, MD   5 mg at 06/19/14 2247  . pantoprazole (PROTONIX) EC tablet 40 mg  40 mg Oral Daily Eileen Stanford, PA-C   40 mg at 06/19/14 4970  . sodium chloride 0.9 %  injection 3 mL  3 mL Intravenous Q12H Eileen Stanford, PA-C   3 mL at 06/19/14 2230  . sodium chloride 0.9 % injection 3 mL  3 mL Intravenous PRN Eileen Stanford, PA-C      . terazosin (HYTRIN) capsule 5 mg  5 mg Oral QHS Eileen Stanford, PA-C   5 mg at 06/19/14 2230    PE: General appearance: alert, cooperative, no distress and moderately obese Lungs: clear to auscultation bilaterally Heart: irregularly irregular rhythm Extremities: no pitting edema Pulses: 2+ and symmetric Skin: warm and dry Neurologic: Grossly normal  Lab Results:   Recent Labs  06/18/14 0534  WBC 5.7  HGB 12.9*  HCT 40.4  PLT 191   BMET  Recent Labs  06/18/14 0534 06/19/14 0455 06/20/14 0335  NA 143 141 141  K 3.7 4.5 4.2  CL 105 101 101  CO2 24 28 29   GLUCOSE 158* 126* 118*  BUN 21 27* 26*  CREATININE 1.43* 1.69* 1.45*  CALCIUM 8.7 8.6 8.7    PT/INR  Recent Labs  06/18/14 0534 06/19/14 1610 06/20/14 0335  LABPROT 37.1* 34.9* 36.4*  INR 3.71* 3.44* 3.63*   Cholesterol No results found for this basename: CHOL,  in the last 72 hours Cardiac Enzymes No components found with this basename: TROPONIN,  [facility],   Studies/Results:  2D echo 06/18/14 Study Conclusions  - Left ventricle: Technically limited study. The cavity size was mildly dilated. Wall thickness was increased in a pattern of mild LVH. The estimated ejection fraction was 50%. Regional wall motion abnormalities cannot be excluded. - Left atrium: The atrium was moderately dilated. - Right ventricle: The cavity size was mildly to moderately dilated. Systolic function was mildly to moderately reduced. - Right atrium: The atrium was moderately dilated. - Tricuspid valve: There was moderate regurgitation. - Pulmonary arteries: PA peak pressure: 64 mm Hg (S). - Pericardium, extracardiac: A probable, small pericardial effusion was identified posterior to the heart.    Assessment/Plan  Active Problems:   Acute on chronic diastolic CHF (congestive heart failure), NYHA class 3   Prolonged Q-T interval on ECG   CHF (congestive heart failure)  1. Acute on chronic Diastolic CHF:  His weight is down to 304 lb (3 lbs below dry weight of 307). Dyspnea improved. Lungs are CTAB. No peripheral edema. Net negative 1.1L since admit. BP controlled. On ARB and BB. Stress low sodium diet. ? Restarting PO Lasix today. Will need f/u BMET as an outpatient.   2. Atrial Fibrillation: Rate controlled and he is asymptomatic. Continue metoprolol BID. He remains on Coumadin. INR is slightly supra therapeutic at 3.63. He is followed in our coumadin clinic.   3. Hypertension: Controlled. Continue BB and ARB.   4. CAD: Stable. He is not on an ASA since he has been on coumadin. Continue statin, beta blocker. Recommend daily exercise.   5. CKD stage 3 - Scr improving, down from 1.69  yesterday to 1.45 today. We might consider Hydralazine/imdur in the future if Crea continues to deteriorate. Can recheck a BMET at f/u.    6. Prolonged QT - might be related to tachycardia, we will continue to monitor   DVT PPX - on Coumadin  Dispo: Likely discharge home today.   MD to follow with further recommendations. If D/C home today, will arrange f/u with Dr. Angelena Form or extender.    LOS: 2 days   Brittainy M. Ladoris Gene 06/20/2014 7:21 AM  The patient was seen, examined and discussed  with Brittainy M. Rosita Fire, PA-C and I agree with the above.   He can be discharged home, he will follow with Dr Angelena Form. CRea improved. Bradycardia improved. Continue the same medical regimen, plus we will prescribe cough medicine.    Dorothy Spark 06/20/2014

## 2014-06-20 NOTE — Progress Notes (Signed)
Utilization Review Completed.   Melvie Paglia, RN, BSN Nurse Case Manager  

## 2014-06-24 DIAGNOSIS — E11622 Type 2 diabetes mellitus with other skin ulcer: Secondary | ICD-10-CM | POA: Diagnosis not present

## 2014-06-24 DIAGNOSIS — I87332 Chronic venous hypertension (idiopathic) with ulcer and inflammation of left lower extremity: Secondary | ICD-10-CM | POA: Diagnosis not present

## 2014-06-24 DIAGNOSIS — L97929 Non-pressure chronic ulcer of unspecified part of left lower leg with unspecified severity: Secondary | ICD-10-CM | POA: Diagnosis not present

## 2014-06-24 LAB — GLUCOSE, CAPILLARY: Glucose-Capillary: 127 mg/dL — ABNORMAL HIGH (ref 70–99)

## 2014-06-25 ENCOUNTER — Ambulatory Visit (INDEPENDENT_AMBULATORY_CARE_PROVIDER_SITE_OTHER): Payer: Medicare Other | Admitting: *Deleted

## 2014-06-25 DIAGNOSIS — Z5181 Encounter for therapeutic drug level monitoring: Secondary | ICD-10-CM

## 2014-06-25 DIAGNOSIS — Z7901 Long term (current) use of anticoagulants: Secondary | ICD-10-CM | POA: Diagnosis not present

## 2014-06-25 DIAGNOSIS — I4891 Unspecified atrial fibrillation: Secondary | ICD-10-CM

## 2014-06-25 DIAGNOSIS — I482 Chronic atrial fibrillation, unspecified: Secondary | ICD-10-CM

## 2014-06-25 LAB — POCT INR: INR: 2.2

## 2014-06-28 ENCOUNTER — Ambulatory Visit: Payer: Medicare Other | Admitting: Podiatry

## 2014-07-01 ENCOUNTER — Other Ambulatory Visit: Payer: Self-pay | Admitting: *Deleted

## 2014-07-01 DIAGNOSIS — L97929 Non-pressure chronic ulcer of unspecified part of left lower leg with unspecified severity: Secondary | ICD-10-CM | POA: Diagnosis not present

## 2014-07-01 DIAGNOSIS — I83892 Varicose veins of left lower extremities with other complications: Secondary | ICD-10-CM

## 2014-07-01 DIAGNOSIS — I87332 Chronic venous hypertension (idiopathic) with ulcer and inflammation of left lower extremity: Secondary | ICD-10-CM | POA: Diagnosis not present

## 2014-07-01 DIAGNOSIS — E11622 Type 2 diabetes mellitus with other skin ulcer: Secondary | ICD-10-CM | POA: Diagnosis not present

## 2014-07-02 ENCOUNTER — Encounter: Payer: Self-pay | Admitting: Vascular Surgery

## 2014-07-05 ENCOUNTER — Encounter (HOSPITAL_COMMUNITY): Payer: Medicare Other

## 2014-07-05 ENCOUNTER — Encounter: Payer: Medicare Other | Admitting: Vascular Surgery

## 2014-07-06 ENCOUNTER — Other Ambulatory Visit: Payer: Self-pay | Admitting: Internal Medicine

## 2014-07-06 DIAGNOSIS — I10 Essential (primary) hypertension: Secondary | ICD-10-CM

## 2014-07-08 ENCOUNTER — Encounter (HOSPITAL_BASED_OUTPATIENT_CLINIC_OR_DEPARTMENT_OTHER): Payer: Medicare Other | Attending: Internal Medicine

## 2014-07-08 DIAGNOSIS — L97929 Non-pressure chronic ulcer of unspecified part of left lower leg with unspecified severity: Secondary | ICD-10-CM | POA: Diagnosis not present

## 2014-07-08 DIAGNOSIS — I87331 Chronic venous hypertension (idiopathic) with ulcer and inflammation of right lower extremity: Secondary | ICD-10-CM | POA: Diagnosis not present

## 2014-07-08 LAB — GLUCOSE, CAPILLARY: Glucose-Capillary: 114 mg/dL — ABNORMAL HIGH (ref 70–99)

## 2014-07-12 ENCOUNTER — Ambulatory Visit (INDEPENDENT_AMBULATORY_CARE_PROVIDER_SITE_OTHER): Payer: Medicare Other | Admitting: Physician Assistant

## 2014-07-12 ENCOUNTER — Encounter: Payer: Self-pay | Admitting: Physician Assistant

## 2014-07-12 ENCOUNTER — Ambulatory Visit (INDEPENDENT_AMBULATORY_CARE_PROVIDER_SITE_OTHER): Payer: Medicare Other | Admitting: Podiatry

## 2014-07-12 ENCOUNTER — Encounter: Payer: Self-pay | Admitting: Podiatry

## 2014-07-12 VITALS — BP 128/92 | HR 94 | Ht 72.0 in | Wt 306.0 lb

## 2014-07-12 DIAGNOSIS — M79676 Pain in unspecified toe(s): Secondary | ICD-10-CM

## 2014-07-12 DIAGNOSIS — N289 Disorder of kidney and ureter, unspecified: Secondary | ICD-10-CM | POA: Diagnosis not present

## 2014-07-12 DIAGNOSIS — I482 Chronic atrial fibrillation, unspecified: Secondary | ICD-10-CM

## 2014-07-12 DIAGNOSIS — I5032 Chronic diastolic (congestive) heart failure: Secondary | ICD-10-CM

## 2014-07-12 DIAGNOSIS — N189 Chronic kidney disease, unspecified: Secondary | ICD-10-CM

## 2014-07-12 DIAGNOSIS — Z6841 Body Mass Index (BMI) 40.0 and over, adult: Secondary | ICD-10-CM

## 2014-07-12 DIAGNOSIS — I2510 Atherosclerotic heart disease of native coronary artery without angina pectoris: Secondary | ICD-10-CM | POA: Diagnosis not present

## 2014-07-12 DIAGNOSIS — B351 Tinea unguium: Secondary | ICD-10-CM

## 2014-07-12 NOTE — Addendum Note (Signed)
Addended by: Claude Manges on: 07/12/2014 02:49 PM   Modules accepted: Orders

## 2014-07-12 NOTE — Assessment & Plan Note (Signed)
Patient had recent admission for acute on chronic diastolic heart failure. He is currently stable without excessive edema. I did ask him to get a scale to monitor his weight at home. He is to call if he gains 2 or 3 pounds overnight. Continue current Lasix dose. We will check a Bmet today with his history of renal insufficiency.followup with Dr.McAlhany in 4-6 weeks.

## 2014-07-12 NOTE — Assessment & Plan Note (Signed)
Patient's creatinine was 1.69 in the hospital and at discharge it was 1.45. We'll recheck today.

## 2014-07-12 NOTE — Patient Instructions (Signed)
Your physician recommends that you continue on your current medications as directed. Please refer to the Current Medication list given to you today.   Your physician recommends that you return for lab work in: BMET    Your physician recommends that you schedule a follow-up appointment in:  WITH DR MCALHANY 4 TO 6 WEEKS      WEIGH YOURSELF EVERYDAY AND IF YOU GAIN 2 TO 3 POUNDS OVERNIGHT CONTACT OFFICE [phone]    Low-Sodium Eating Plan Sodium raises blood pressure and causes water to be held in the body. Getting less sodium from food will help lower your blood pressure, reduce any swelling, and protect your heart, liver, and kidneys. We get sodium by adding salt (sodium chloride) to food. Most of our sodium comes from canned, boxed, and frozen foods. Restaurant foods, fast foods, and pizza are also very high in sodium. Even if you take medicine to lower your blood pressure or to reduce fluid in your body, getting less sodium from your food is important. WHAT IS MY PLAN? Most people should limit their sodium intake to 2,300 mg a day. Your health care provider recommends that you limit your sodium intake to __________ a day.  WHAT DO I NEED TO KNOW ABOUT THIS EATING PLAN? For the low-sodium eating plan, you will follow these general guidelines:  Choose foods with a % Daily Value for sodium of less than 5% (as listed on the food label).   Use salt-free seasonings or herbs instead of table salt or sea salt.   Check with your health care provider or pharmacist before using salt substitutes.   Eat fresh foods.  Eat more vegetables and fruits.  Limit canned vegetables. If you do use them, rinse them well to decrease the sodium.   Limit cheese to 1 oz (28 g) per day.   Eat lower-sodium products, often labeled as "lower sodium" or "no salt added."  Avoid foods that contain monosodium glutamate (MSG). MSG is sometimes added to [place] food and some canned foods.  Check food  labels (Nutrition Facts labels) on foods to learn how much sodium is in one serving.  Eat more home-cooked food and less restaurant, buffet, and fast food.  When eating at a restaurant, ask that your food be prepared with less salt or none, if possible.  HOW DO I READ FOOD LABELS FOR SODIUM INFORMATION? The Nutrition Facts label lists the amount of sodium in one serving of the food. If you eat more than one serving, you must multiply the listed amount of sodium by the number of servings. Food labels may also identify foods as:  Sodium free--Less than 5 mg in a serving.  Very low sodium--35 mg or less in a serving.  Low sodium--140 mg or less in a serving.  Light in sodium--50% less sodium in a serving. For example, if a food that usually has 300 mg of sodium is changed to become light in sodium, it will have 150 mg of sodium.  Reduced sodium--25% less sodium in a serving. For example, if a food that usually has 400 mg of sodium is changed to reduced sodium, it will have 300 mg of sodium. WHAT FOODS CAN I EAT? Grains Low-sodium cereals, including oats, puffed wheat and rice, and shredded wheat cereals. Low-sodium crackers. Unsalted rice and pasta. Lower-sodium bread.  Vegetables Frozen or fresh vegetables. Low-sodium or reduced-sodium canned vegetables. Low-sodium or reduced-sodium tomato sauce and paste. Low-sodium or reduced-sodium tomato and vegetable juices.  Fruits Fresh, frozen,  and canned fruit. Fruit juice.  Meat and Other Protein Products Low-sodium canned tuna and salmon. Fresh or frozen meat, poultry, seafood, and fish. Lamb. Unsalted nuts. Dried beans, peas, and lentils without added salt. Unsalted canned beans. Homemade soups without salt. Eggs.  Dairy Milk. Soy milk. Ricotta cheese. Low-sodium or reduced-sodium cheeses. Yogurt.  Condiments Fresh and dried herbs and spices. Salt-free seasonings. Onion and garlic powders. Low-sodium varieties of mustard and ketchup.  Lemon juice.  Fats and Oils Reduced-sodium salad dressings. Unsalted butter.  Other Unsalted popcorn and pretzels.  The items listed above may not be a complete list of recommended foods or beverages. Contact your dietitian for more options. WHAT FOODS ARE NOT RECOMMENDED? Grains Instant hot cereals. Bread stuffing, pancake, and biscuit mixes. Croutons. Seasoned rice or pasta mixes. Noodle soup cups. Boxed or frozen macaroni and cheese. Self-rising flour. Regular salted crackers. Vegetables Regular canned vegetables. Regular canned tomato sauce and paste. Regular tomato and vegetable juices. Frozen vegetables in sauces. Salted french fries. Olives. Angie Fava. Relishes. Sauerkraut. Salsa. Meat and Other Protein Products Salted, canned, smoked, spiced, or pickled meats, seafood, or fish. Bacon, ham, sausage, hot dogs, corned beef, chipped beef, and packaged luncheon meats. Salt pork. Jerky. Pickled herring. Anchovies, regular canned tuna, and sardines. Salted nuts. Dairy Processed cheese and cheese spreads. Cheese curds. Blue cheese and cottage cheese. Buttermilk.  Condiments Onion and garlic salt, seasoned salt, table salt, and sea salt. Canned and packaged gravies. Worcestershire sauce. Tartar sauce. Barbecue sauce. Teriyaki sauce. Soy sauce, including reduced sodium. Steak sauce. Fish sauce. Oyster sauce. Cocktail sauce. Horseradish. Regular ketchup and mustard. Meat flavorings and tenderizers. Bouillon cubes. Hot sauce. Tabasco sauce. Marinades. Taco seasonings. Relishes. Fats and Oils Regular salad dressings. Salted butter. Margarine. Ghee. Bacon fat.  Other Potato and tortilla chips. Corn chips and puffs. Salted popcorn and pretzels. Canned or dried soups. Pizza. Frozen entrees and pot pies.  The items listed above may not be a complete list of foods and beverages to avoid. Contact your dietitian for more information. Document Released: 02/09/2002 Document Revised: 08/25/2013  Document Reviewed: 06/24/2013 [facility] Patient Information 2015 [place], [place]. This information is not intended to replace advice given to you by your health care provider. Make sure you discuss any questions you have with your health care provider.

## 2014-07-12 NOTE — Progress Notes (Signed)
[facility]:QPRF is a 58 year old male patient of Dr.McAlhany with history of diastolic CHF, paroxysmal atrial fibrillation on Coumadin, mild nonobstructive CAD on cath in 2007 a low risk stress Myoview in 2013. He presented to the hospital with acute on chronic diastolic heart failure and prolonged QT interval on EKG. 2-D echo showed normal systolic function EF 16%. He diuresed down to his dry weight of 304 pounds.he was continued on his ARB however it had been recommended that transition to hydralazine plus nitrate may need to be considered in the future if he has worsening renal function. Creatinine day of discharge was 1.45.Metoprolol was decreased to 50 mg twice a day because of nocturnal bradycardia with rates in the 30s. Digoxin was discontinued.  Patient also has history of hypertension, HLD, COPD, and gout.  Patient comes in today without complaints. He has chronic dyspnea on exertion but no worse than usual. His weight is up 2 pounds since discharge. He denies any excess of edema or shortness of breath. He is very stoic. His heart rate is 94 today but he denies any rapid heart rates at home.  Allergies  Allergen Reactions  . Ramipril Swelling  . Testosterone Rash     Current Outpatient Prescriptions  Medication Sig Dispense Refill  . albuterol (PROAIR HFA) 108 (90 BASE) MCG/ACT inhaler Inhale 2 puffs into the lungs every 6 (six) hours as needed for wheezing or shortness of breath. 17 each 3  . allopurinol (ZYLOPRIM) 300 MG tablet Take 1 tablet (300 mg total) by mouth daily. 90 tablet 3  . atorvastatin (LIPITOR) 10 MG tablet Take 1 tablet (10 mg total) by mouth daily. 90 tablet 3  . budesonide-formoterol (SYMBICORT) 160-4.5 MCG/ACT inhaler Inhale 2 puffs into the lungs 2 (two) times daily. 1 Inhaler 12  . diclofenac (VOLTAREN) 50 MG EC tablet Take 1 tablet (50 mg total) by mouth 2 (two) times daily. 180 tablet 3  . esomeprazole (NEXIUM) 40 MG capsule Take 1 capsule (40 mg total) by  mouth daily at 12 noon. 90 capsule 3  . fenofibrate 160 MG tablet Take 1 tablet (160 mg total) by mouth daily. 90 tablet 3  . furosemide (LASIX) 80 MG tablet Take 1 tablet (80 mg total) by mouth daily. 90 tablet 3  . guaiFENesin-codeine 100-10 MG/5ML syrup Take 5 mLs by mouth every 6 (six) hours as needed for cough. 120 mL 0  . latanoprost (XALATAN) 0.005 % ophthalmic solution Place 1 drop into both eyes at bedtime. 7.5 mL 3  . loratadine (CLARITIN) 10 MG tablet Take 1 tablet (10 mg total) by mouth daily as needed for allergies. 90 tablet 3  . metoprolol (LOPRESSOR) 50 MG tablet Take 1 tablet (50 mg total) by mouth 2 (two) times daily. 180 tablet 3  . nitroGLYCERIN (NITROSTAT) 0.4 MG SL tablet Place 0.4 mg under the tongue every 5 (five) minutes as needed for chest pain.    [name] oxyCODONE-acetaminophen (PERCOCET) 10-325 MG per tablet Take 1 tablet by mouth every 6 (six) hours as needed for pain. 120 tablet 0  . terazosin (HYTRIN) 5 MG capsule Take 1 capsule (5 mg total) by mouth at bedtime. 90 capsule 3  . valsartan (DIOVAN) 320 MG tablet Take 1 tablet (320 mg total) by mouth daily. 90 tablet 3  . warfarin (COUMADIN) 5 MG tablet One-half tablet (2.5 mg) daily except 1 tablet (5 mg) on Sundays, Tuesdays, and Thursdays. 60 tablet 0   No current facility-administered medications for this visit.  Past Medical History  Diagnosis Date  . Essential hypertension 09/20/2011  . Atrial fibrillation 02/04/2012  . Chronic diastolic heart failure 10/10/348    with mild left ventricular hypertrophy on Echo 02/2010  . Chronic pain syndrome 01/15/2013    Left arm and leg s/p traumatic injury   . Osteoarthritis cervical spine 04/25/2013  . Gastroesophageal reflux disease 04/25/2013  . Open-angle glaucoma 04/25/2013  . 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 04/25/2013  . Hyperlipidemia LDL goal < 100 04/25/2013  . Coronary artery disease 04/25/2013    25% LAD stenosis on cath 2007.   Stable angina.  . Asthma, chronic 04/25/2013    Clinical diagnosis  . Gout 04/25/2013  . Right rotator cuff tear 04/25/2013    Large full-thickness tear of the supraspinatus with mild retraction but no atrophy   . Vasomotor rhinitis 04/25/2013  . Obstructive sleep apnea 06/01/2013    Moderate, AHI 29.8 per hour with moderately loud snoring and oxygen desaturation to a nadir of 79%. CPAP titration resulted in a prescription for 17 CWP.    [name] Ulcer     History of ulcer in the remote past  . Cataract     Left eye  . Onychomycosis of toenail 12/24/2012    Left great toe   . Morbid obesity with BMI of 40.0-44.9, adult 04/25/2013  . Blood transfusion without reported diagnosis   . Chronic obstructive pulmonary disease 04/25/2013  . Diverticulosis 11/12/2013  . Osteoarthritis of left knee 06/19/2013    Tricompartmental disease.  Treated with double hinged upright knee brace, steroid/xylocaine knee injections, and NSAIDs   . CHF (congestive heart failure)   . Shortness of breath     Past Surgical History  Procedure Laterality Date  . Left leg surgery    . Left arm surgery    . Shoulder surgery      Right  . Fracture surgery Left 1980's    Elbow    Family History  Problem Relation Age of Onset  . Heart failure Mother   . Alzheimer's disease Father   . Healthy Sister   . Healthy Brother   . Healthy Son   . Healthy Sister   . Healthy Sister   . Healthy Sister   . Healthy Sister   . Healthy Brother   . Healthy Brother   . Healthy Brother   . Heart failure Brother   . Healthy Brother   . Osteoarthritis Brother   . Prostate cancer Brother   . Early death Brother     Gun Shot Wound    History   Social History  . Marital Status: Widowed    Spouse Name: N/A    Number of Children: N/A  . Years of Education: N/A   Occupational History  . Not on file.   Social History Main Topics  . Smoking status: Never Smoker   . Smokeless tobacco: Never Used  . Alcohol Use: 1.0 oz/week     2 drink(s) per week     Comment: Liquor.  . Drug Use: No  . Sexual Activity: Yes    Birth Control/ Protection: None   Other Topics Concern  . Not on file   Social History Narrative  . No narrative on file    ROS:see history of present illness otherwise negative  BP 128/92 mmHg  Pulse 94  Ht 6' (1.829 m)  Wt 306 lb (138.801 kg)  BMI 41.49 kg/m2  PHYSICAL EXAM:Obese, in no acute distress. Neck: No  JVD, HJR, Bruit, or thyroid enlargement  Lungs:decreased breath sounds but No tachypnea, clear without wheezing, rales, or rhonchi  Cardiovascular:irregular irregular, PMI not displaced, Normal S1 and S2, distant heart sounds,no murmurs, gallops, bruit, thrill, or heave.  Abdomen: BS normal. Soft without organomegaly, masses, lesions or tenderness.  Extremities: +1 edema on the right chronic +2 edema on the left otherwise lower extremities without cyanosis, clubbing. Decreased distal pulses bilateral  SKin: Warm, no lesions or rashes   Musculoskeletal: No deformities  Neuro: no focal signs   Wt Readings from Last 3 Encounters:  06/20/14 304 lb 14.4 oz (138.302 kg)  05/13/14 307 lb (139.254 kg)  05/13/14 307 lb 14.4 oz (139.663 kg)    Lab Results  Component Value Date   WBC 5.7 06/18/2014   HGB 12.9* 06/18/2014   HCT 40.4 06/18/2014   PLT 191 06/18/2014   GLUCOSE 118* 06/20/2014   CHOL 107 05/13/2014   TRIG 138 05/13/2014   HDL 54 05/13/2014   LDLCALC 25 05/13/2014   ALT 25 04/24/2013   AST 59* 04/24/2013   NA 141 06/20/2014   K 4.2 06/20/2014   CL 101 06/20/2014   CREATININE 1.45* 06/20/2014   BUN 26* 06/20/2014   CO2 29 06/20/2014   TSH 2.000 06/18/2014   PSA 0.33 06/19/2013   INR 2.2 06/25/2014   HGBA1C 6.5 05/13/2014   MICROALBUR 3.46* 05/13/2014    SHF:WYOVZC fibrillation at 94 beats per minute    2D Echo 06/18/14 Study Conclusions  - Left ventricle: Technically limited study. The cavity size was mildly dilated. Wall thickness was increased in  a pattern of mild LVH. The estimated ejection fraction was 50%. Regional wall motion abnormalities cannot be excluded. - Left atrium: The atrium was moderately dilated. - Right ventricle: The cavity size was mildly to moderately dilated. Systolic function was mildly to moderately reduced. - Right atrium: The atrium was moderately dilated. - Tricuspid valve: There was moderate regurgitation. - Pulmonary arteries: PA peak pressure: 64 mm Hg (S). - Pericardium, extracardiac: A probable, small pericardial effusion was identified posterior to the heart.

## 2014-07-12 NOTE — Assessment & Plan Note (Signed)
Recommend weight loss program 

## 2014-07-12 NOTE — Assessment & Plan Note (Signed)
Patient's metoprolol was decreased from 100 mg twice a day to 50 mg twice a day and his digoxin was stopped because of bradycardia in the 30s at night while in the hospital. His heart rate is 94 today. He denies any palpitations or rapid heart beats. We may need to put a Holter monitor on him at some point to monitor his heart rates closer.

## 2014-07-13 ENCOUNTER — Other Ambulatory Visit (INDEPENDENT_AMBULATORY_CARE_PROVIDER_SITE_OTHER): Payer: Medicare Other

## 2014-07-13 DIAGNOSIS — N289 Disorder of kidney and ureter, unspecified: Secondary | ICD-10-CM

## 2014-07-13 LAB — BASIC METABOLIC PANEL
BUN: 22 mg/dL (ref 6–23)
CO2: 32 mEq/L (ref 19–32)
CREATININE: 1.3 mg/dL (ref 0.4–1.5)
Calcium: 9.1 mg/dL (ref 8.4–10.5)
Chloride: 102 mEq/L (ref 96–112)
GFR: 72.42 mL/min (ref >60.00)
Glucose, Bld: 116 mg/dL — ABNORMAL HIGH (ref 70–99)
Potassium: 3.6 mEq/L (ref 3.5–5.1)
Sodium: 141 mEq/L (ref 135–145)

## 2014-07-13 NOTE — Progress Notes (Signed)
Patient ID: Juan Calderon, male   DOB: 03/05/1956, 58 y.o.   MRN: [id number]  Subjective: As patient presents again today complaining of painful toenails He is under the care of care center for skin lesion on left lower extremity  Objective: Unna boot dressing left lower leg The toenails are elongated, incurvated, discolored 6-10 The left hallux toenail is hypertrophic, brittle and discolored  Assessment: Symptomatic onychomycoses 6-10  Plan: Debrided toenails 10 without a bleeding  Reappoint 3 months

## 2014-07-15 DIAGNOSIS — I87331 Chronic venous hypertension (idiopathic) with ulcer and inflammation of right lower extremity: Secondary | ICD-10-CM | POA: Diagnosis not present

## 2014-07-15 DIAGNOSIS — L97929 Non-pressure chronic ulcer of unspecified part of left lower leg with unspecified severity: Secondary | ICD-10-CM | POA: Diagnosis not present

## 2014-07-22 DIAGNOSIS — I87331 Chronic venous hypertension (idiopathic) with ulcer and inflammation of right lower extremity: Secondary | ICD-10-CM | POA: Diagnosis not present

## 2014-07-22 DIAGNOSIS — L97929 Non-pressure chronic ulcer of unspecified part of left lower leg with unspecified severity: Secondary | ICD-10-CM | POA: Diagnosis not present

## 2014-07-22 LAB — GLUCOSE, CAPILLARY: Glucose-Capillary: 119 mg/dL — ABNORMAL HIGH (ref 70–99)

## 2014-07-23 ENCOUNTER — Ambulatory Visit (INDEPENDENT_AMBULATORY_CARE_PROVIDER_SITE_OTHER): Payer: Medicare Other | Admitting: Pharmacist

## 2014-07-23 DIAGNOSIS — Z5181 Encounter for therapeutic drug level monitoring: Secondary | ICD-10-CM

## 2014-07-23 DIAGNOSIS — I4891 Unspecified atrial fibrillation: Secondary | ICD-10-CM | POA: Diagnosis not present

## 2014-07-23 DIAGNOSIS — I482 Chronic atrial fibrillation, unspecified: Secondary | ICD-10-CM

## 2014-07-23 DIAGNOSIS — Z7901 Long term (current) use of anticoagulants: Secondary | ICD-10-CM

## 2014-07-23 LAB — POCT INR: INR: 2.5

## 2014-07-28 DIAGNOSIS — I87331 Chronic venous hypertension (idiopathic) with ulcer and inflammation of right lower extremity: Secondary | ICD-10-CM | POA: Diagnosis not present

## 2014-07-28 DIAGNOSIS — L97929 Non-pressure chronic ulcer of unspecified part of left lower leg with unspecified severity: Secondary | ICD-10-CM | POA: Diagnosis not present

## 2014-08-04 ENCOUNTER — Encounter (HOSPITAL_BASED_OUTPATIENT_CLINIC_OR_DEPARTMENT_OTHER): Payer: Medicare Other | Attending: General Surgery

## 2014-08-04 DIAGNOSIS — L97929 Non-pressure chronic ulcer of unspecified part of left lower leg with unspecified severity: Secondary | ICD-10-CM | POA: Insufficient documentation

## 2014-08-04 DIAGNOSIS — I87332 Chronic venous hypertension (idiopathic) with ulcer and inflammation of left lower extremity: Secondary | ICD-10-CM | POA: Diagnosis not present

## 2014-08-04 LAB — GLUCOSE, CAPILLARY: Glucose-Capillary: 138 mg/dL — ABNORMAL HIGH (ref 70–99)

## 2014-08-05 ENCOUNTER — Encounter: Payer: Self-pay | Admitting: Vascular Surgery

## 2014-08-05 DIAGNOSIS — H2513 Age-related nuclear cataract, bilateral: Secondary | ICD-10-CM | POA: Diagnosis not present

## 2014-08-05 DIAGNOSIS — H04123 Dry eye syndrome of bilateral lacrimal glands: Secondary | ICD-10-CM | POA: Diagnosis not present

## 2014-08-05 DIAGNOSIS — H40013 Open angle with borderline findings, low risk, bilateral: Secondary | ICD-10-CM | POA: Diagnosis not present

## 2014-08-06 ENCOUNTER — Ambulatory Visit (INDEPENDENT_AMBULATORY_CARE_PROVIDER_SITE_OTHER): Payer: Medicare Other | Admitting: Vascular Surgery

## 2014-08-06 ENCOUNTER — Ambulatory Visit (HOSPITAL_COMMUNITY)
Admission: RE | Admit: 2014-08-06 | Discharge: 2014-08-06 | Disposition: A | Discharge status: Home / Self Care | Payer: Medicare Other | Source: Ambulatory Visit | Attending: Vascular Surgery | Admitting: Vascular Surgery

## 2014-08-06 ENCOUNTER — Encounter: Payer: Self-pay | Admitting: Vascular Surgery

## 2014-08-06 VITALS — BP 159/111 | HR 87 | Ht 72.0 in | Wt 302.5 lb

## 2014-08-06 DIAGNOSIS — I83029 Varicose veins of left lower extremity with ulcer of unspecified site: Secondary | ICD-10-CM

## 2014-08-06 DIAGNOSIS — I87312 Chronic venous hypertension (idiopathic) with ulcer of left lower extremity: Secondary | ICD-10-CM

## 2014-08-06 DIAGNOSIS — I83892 Varicose veins of left lower extremities with other complications: Secondary | ICD-10-CM | POA: Insufficient documentation

## 2014-08-06 DIAGNOSIS — I87332 Chronic venous hypertension (idiopathic) with ulcer and inflammation of left lower extremity: Secondary | ICD-10-CM | POA: Diagnosis not present

## 2014-08-06 DIAGNOSIS — L97929 Non-pressure chronic ulcer of unspecified part of left lower leg with unspecified severity: Secondary | ICD-10-CM | POA: Insufficient documentation

## 2014-08-06 NOTE — Progress Notes (Signed)
Referred by:  Ricard Dillon, MD [place], [place] [address]  Reason for referral: Poorly healing left leg  History of Present Illness  Juan Calderon is a 58 y.o. (Nov 03, 1955) male with history of trauma to L leg resulting in multiple reconstructive procedures to L leg who presents with chief complaint: chronic ulcers in L leg.  Pt is referred her from [facility] for possible EVLA L GSV.  Patient notes, onset of swelling years associated with trauma.  The patient's symptoms include: weeping, swelling and ulcers.  The patient has had no history of DVT, mp history of varicose vein, lmpwm history of venous stasis ulcers, mp history of  Lymphedema and lmpwm history of skin changes in lower legs.  There is mp family history of venous disorders.  The patient has been using unna boots and ProFore bandages for the L leg  Past Medical History  Diagnosis Date  . Essential hypertension 09/20/2011  . Atrial fibrillation 02/04/2012  . Chronic diastolic heart failure 05/11/3381    with mild left ventricular hypertrophy on Echo 02/2010  . Chronic pain syndrome 01/15/2013    Left arm and leg s/p traumatic injury   . Osteoarthritis cervical spine 04/25/2013  . Gastroesophageal reflux disease 04/25/2013  . Open-angle glaucoma 04/25/2013  . 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 04/25/2013  . Hyperlipidemia LDL goal < 100 04/25/2013  . Coronary artery disease 04/25/2013    25% LAD stenosis on cath 2007.  Stable angina.  . Asthma, chronic 04/25/2013    Clinical diagnosis  . Gout 04/25/2013  . Right rotator cuff tear 04/25/2013    Large full-thickness tear of the supraspinatus with mild retraction but no atrophy   . Vasomotor rhinitis 04/25/2013  . Obstructive sleep apnea 06/01/2013    Moderate, AHI 29.8 per hour with moderately loud snoring and oxygen desaturation to a nadir of 79%. CPAP titration resulted in a prescription for 17 CWP.    [name] Ulcer     History of  ulcer in the remote past  . Cataract     Left eye  . Onychomycosis of toenail 12/24/2012    Left great toe   . Morbid obesity with BMI of 40.0-44.9, adult 04/25/2013  . Blood transfusion without reported diagnosis   . Chronic obstructive pulmonary disease 04/25/2013  . Diverticulosis 11/12/2013  . Osteoarthritis of left knee 06/19/2013    Tricompartmental disease.  Treated with double hinged upright knee brace, steroid/xylocaine knee injections, and NSAIDs   . CHF (congestive heart failure)   . Shortness of breath   . Chronic renal insufficiency 07/12/2014    Past Surgical History  Procedure Laterality Date  . Left leg surgery    . Left arm surgery    . Shoulder surgery      Right  . Fracture surgery Left 1980's    Elbow    History   Social History  . Marital Status: Widowed    Spouse Name: N/A    Number of Children: N/A  . Years of Education: N/A   Occupational History  . Not on file.   Social History Main Topics  . Smoking status: Never Smoker   . Smokeless tobacco: Never Used  . Alcohol Use: 1.0 oz/week    2 drink(s) per week     Comment: Liquor.  . Drug Use: No  . Sexual Activity: Yes    Birth Control/ Protection: None   Other Topics Concern  . Not  on file   Social History Narrative    Family History  Problem Relation Age of Onset  . Heart failure Mother   . Alzheimer's disease Father   . Healthy Sister   . Healthy Brother   . Healthy Son   . Healthy Sister   . Healthy Sister   . Healthy Sister   . Healthy Sister   . Healthy Brother   . Healthy Brother   . Healthy Brother   . Heart failure Brother   . Healthy Brother   . Osteoarthritis Brother   . Prostate cancer Brother   . Early death Brother     Gun Shot Wound    Current Outpatient Prescriptions on File Prior to Visit  Medication Sig Dispense Refill  . albuterol (PROAIR HFA) 108 (90 BASE) MCG/ACT inhaler Inhale 2 puffs into the lungs every 6 (six) hours as needed for wheezing or shortness  of breath. 17 each 3  . allopurinol (ZYLOPRIM) 300 MG tablet Take 1 tablet (300 mg total) by mouth daily. 90 tablet 3  . atorvastatin (LIPITOR) 10 MG tablet Take 1 tablet (10 mg total) by mouth daily. 90 tablet 3  . budesonide-formoterol (SYMBICORT) 160-4.5 MCG/ACT inhaler Inhale 2 puffs into the lungs 2 (two) times daily. 1 Inhaler 12  . esomeprazole (NEXIUM) 40 MG capsule Take 1 capsule (40 mg total) by mouth daily at 12 noon. 90 capsule 3  . fenofibrate 160 MG tablet Take 1 tablet (160 mg total) by mouth daily. 90 tablet 3  . furosemide (LASIX) 80 MG tablet Take 1 tablet (80 mg total) by mouth daily. 90 tablet 3  . guaiFENesin-codeine 100-10 MG/5ML syrup Take 5 mLs by mouth every 6 (six) hours as needed for cough. 120 mL 0  . latanoprost (XALATAN) 0.005 % ophthalmic solution Place 1 drop into both eyes at bedtime. 7.5 mL 3  . metoprolol (LOPRESSOR) 50 MG tablet Take 1 tablet (50 mg total) by mouth 2 (two) times daily. 180 tablet 3  . nitroGLYCERIN (NITROSTAT) 0.4 MG SL tablet Place 0.4 mg under the tongue every 5 (five) minutes as needed for chest pain.    [name] terazosin (HYTRIN) 5 MG capsule Take 1 capsule (5 mg total) by mouth at bedtime. 90 capsule 3  . valsartan (DIOVAN) 320 MG tablet Take 1 tablet (320 mg total) by mouth daily. 90 tablet 3  . warfarin (COUMADIN) 5 MG tablet One-half tablet (2.5 mg) daily except 1 tablet (5 mg) on Sundays, Tuesdays, and Thursdays. 60 tablet 0  . oxyCODONE-acetaminophen (PERCOCET) 10-325 MG per tablet Take 1 tablet by mouth every 6 (six) hours as needed for pain. 120 tablet 0   No current facility-administered medications on file prior to visit.    Allergies  Allergen Reactions  . Ramipril Swelling  . Testosterone Rash     REVIEW OF SYSTEMS:  (Positives checked otherwise negative)  CARDIOVASCULAR:  []  chest pain, []  chest pressure, []  palpitations, []  shortness of breath when laying flat, []  shortness of breath with exertion,  []  pain in feet when  walking, []  pain in feet when laying flat, []  history of blood clot in veins (DVT), []  history of phlebitis, [x]  swelling in legs, []  varicose veins  PULMONARY:  []  productive cough, []  asthma, []  wheezing  NEUROLOGIC:  []  weakness in arms or legs, []  numbness in arms or legs, []  difficulty speaking or slurred speech, []  temporary loss of vision in one eye, []  dizziness  HEMATOLOGIC:  []  bleeding problems, []  problems  with blood clotting too easily  MUSCULOSKEL:  []  joint pain, []  joint swelling  GASTROINTEST:  []  vomiting blood, []  blood in stool     GENITOURINARY:  []  burning with urination, []  blood in urine  PSYCHIATRIC:  []  history of major depression  INTEGUMENTARY:  []  rashes, [x]  ulcers  CONSTITUTIONAL:  []  fever, []  chills   Physical Examination Filed Vitals:   08/06/14 1017  BP: 159/111  Pulse: 87  Height: 6' (1.829 m)  Weight: 302 lb 8 oz (137.213 kg)  SpO2: 95%   Body mass index is 41.02 kg/(m^2).  General: A&O x 3, WDWN, obese  Head: [place]/AT  Ear/Nose/Throat: Hearing grossly intact, nares w/o erythema or drainage, oropharynx w/o Erythema/Exudate  Eyes: PERRLA, EOMI  Neck: Supple, no nuchal rigidity, no palpable LAD  Pulmonary: Sym exp, good air movt, CTAB, no rales, rhonchi, & wheezing  Cardiac: RRR, Nl S1, S2, no Murmurs, rubs or gallops  Vascular: Vessel Right Left  Radial Faintly Palpable Faintly Palpable  Brachial Faintly Palpable Faintly Palpable  Carotid Palpable, without bruit Palpable, without bruit  Aorta Not palpable N/A  Femoral Palpable Palpable  Popliteal Not palpable Not palpable  PT Not Palpable Not Palpable  DP Palpable Palpable   Gastrointestinal: soft, NTND, -G/R, - HSM, - masses, - CVAT B  Musculoskeletal: M/S 5/5 throughout , Extremities without ischemic changes , multiple healed surgical incisions in L leg, healed venous ulcers medially with one nearly healed in mid-med shin  Neurologic: CN 2-12 intact , Pain and light touch  intact in extremities , Motor exam as listed above  Psychiatric: Judgment intact, Mood & affect appropriate for pt's clinical situation  Dermatologic: See M/S exam for extremity exam, no rashes otherwise noted  Lymph : No Cervical, Axillary, or Inguinal lymphadenopathy   Non-Invasive Vascular Imaging  LLE Venous Insufficiency Duplex (Date: 08/06/2014):   No DVT and SVT,   No GSV reflux,   + deep venous reflux: CFV, FV, PV  Outside Studies/Documentation 5 pages of outside documents were reviewed including: outpatient wound care and PCP chart.  Medical Decision Making  HIKEEM ANDERSSON is a 58 y.o. male who presents with: LLE chronic venous insufficiency (C6) with healing venous ulcer   Based on the patient's history and examination, I recommend: compressive therapy once her fully heals.  Cont unna boot and ProFore bandage until then..  I discussed with the patient the use of her 20-30 mm thigh high compression stockings   I don't think he meet the criteria to consider vein transplantation, which is usually done at specialized vein centers.  Thank you for allowing [place] to participate in this patient's care.  Adele Barthel, MD [facility] Office: [phone] Pager: [phone]  08/06/2014, 10:29 AM

## 2014-08-08 ENCOUNTER — Encounter: Payer: Self-pay | Admitting: Internal Medicine

## 2014-08-11 DIAGNOSIS — I87332 Chronic venous hypertension (idiopathic) with ulcer and inflammation of left lower extremity: Secondary | ICD-10-CM | POA: Diagnosis not present

## 2014-08-11 DIAGNOSIS — L97929 Non-pressure chronic ulcer of unspecified part of left lower leg with unspecified severity: Secondary | ICD-10-CM | POA: Diagnosis not present

## 2014-08-13 ENCOUNTER — Ambulatory Visit (INDEPENDENT_AMBULATORY_CARE_PROVIDER_SITE_OTHER): Payer: Medicare Other | Admitting: Internal Medicine

## 2014-08-13 ENCOUNTER — Encounter: Payer: Self-pay | Admitting: Internal Medicine

## 2014-08-13 VITALS — BP 142/83 | HR 88 | Temp 97.6°F | Wt 305.4 lb

## 2014-08-13 DIAGNOSIS — I25119 Atherosclerotic heart disease of native coronary artery with unspecified angina pectoris: Secondary | ICD-10-CM

## 2014-08-13 DIAGNOSIS — I10 Essential (primary) hypertension: Secondary | ICD-10-CM

## 2014-08-13 DIAGNOSIS — T1490 Injury, unspecified: Secondary | ICD-10-CM | POA: Diagnosis not present

## 2014-08-13 DIAGNOSIS — N521 Erectile dysfunction due to diseases classified elsewhere: Secondary | ICD-10-CM

## 2014-08-13 DIAGNOSIS — L97929 Non-pressure chronic ulcer of unspecified part of left lower leg with unspecified severity: Secondary | ICD-10-CM

## 2014-08-13 DIAGNOSIS — M1732 Unilateral post-traumatic osteoarthritis, left knee: Secondary | ICD-10-CM | POA: Diagnosis not present

## 2014-08-13 DIAGNOSIS — Z7901 Long term (current) use of anticoagulants: Secondary | ICD-10-CM

## 2014-08-13 DIAGNOSIS — E1140 Type 2 diabetes mellitus with diabetic neuropathy, unspecified: Secondary | ICD-10-CM

## 2014-08-13 DIAGNOSIS — I83029 Varicose veins of left lower extremity with ulcer of unspecified site: Secondary | ICD-10-CM

## 2014-08-13 DIAGNOSIS — N529 Male erectile dysfunction, unspecified: Secondary | ICD-10-CM | POA: Diagnosis not present

## 2014-08-13 DIAGNOSIS — K219 Gastro-esophageal reflux disease without esophagitis: Secondary | ICD-10-CM

## 2014-08-13 DIAGNOSIS — I482 Chronic atrial fibrillation, unspecified: Secondary | ICD-10-CM

## 2014-08-13 DIAGNOSIS — I878 Other specified disorders of veins: Secondary | ICD-10-CM

## 2014-08-13 DIAGNOSIS — J449 Chronic obstructive pulmonary disease, unspecified: Secondary | ICD-10-CM

## 2014-08-13 DIAGNOSIS — I5032 Chronic diastolic (congestive) heart failure: Secondary | ICD-10-CM

## 2014-08-13 DIAGNOSIS — G894 Chronic pain syndrome: Secondary | ICD-10-CM

## 2014-08-13 DIAGNOSIS — E1149 Type 2 diabetes mellitus with other diabetic neurological complication: Secondary | ICD-10-CM | POA: Diagnosis not present

## 2014-08-13 DIAGNOSIS — X58XXXS Exposure to other specified factors, sequela: Secondary | ICD-10-CM

## 2014-08-13 DIAGNOSIS — Z7951 Long term (current) use of inhaled steroids: Secondary | ICD-10-CM

## 2014-08-13 DIAGNOSIS — R1314 Dysphagia, pharyngoesophageal phase: Secondary | ICD-10-CM

## 2014-08-13 DIAGNOSIS — E785 Hyperlipidemia, unspecified: Secondary | ICD-10-CM

## 2014-08-13 DIAGNOSIS — J45909 Unspecified asthma, uncomplicated: Secondary | ICD-10-CM

## 2014-08-13 LAB — LIPID PANEL
CHOL/HDL RATIO: 2 ratio
CHOLESTEROL: 108 mg/dL (ref 0–200)
HDL: 55 mg/dL (ref >39)
LDL Cholesterol: 34 mg/dL (ref 0–99)
Triglycerides: 93 mg/dL (ref <150)
VLDL: 19 mg/dL (ref 0–40)

## 2014-08-13 LAB — BASIC METABOLIC PANEL WITH GFR
BUN: 25 mg/dL — ABNORMAL HIGH (ref 6–23)
CHLORIDE: 96 meq/L (ref 96–112)
CO2: 29 mEq/L (ref 19–32)
Calcium: 9.8 mg/dL (ref 8.4–10.5)
Creat: 1.28 mg/dL (ref 0.50–1.35)
GFR, Est African American: 70 mL/min
GFR, Est Non African American: 60 mL/min
Glucose, Bld: 158 mg/dL — ABNORMAL HIGH (ref 70–99)
POTASSIUM: 3.8 meq/L (ref 3.5–5.3)
Sodium: 142 mEq/L (ref 135–145)

## 2014-08-13 LAB — GLUCOSE, CAPILLARY: GLUCOSE-CAPILLARY: 177 mg/dL — AB (ref 70–99)

## 2014-08-13 LAB — POCT GLYCOSYLATED HEMOGLOBIN (HGB A1C): Hemoglobin A1C: 6.9

## 2014-08-13 MED ORDER — OXYCODONE-ACETAMINOPHEN 10-325 MG PO TABS: 1.0000 | ORAL_TABLET | Freq: Four times a day (QID) | ORAL | Status: DC | PRN

## 2014-08-13 MED ORDER — SULINDAC 200 MG PO TABS: 200.0000 mg | ORAL_TABLET | Freq: Two times a day (BID) | ORAL | Status: DC | PRN

## 2014-08-13 NOTE — Assessment & Plan Note (Signed)
His pain is currently stable on the oxycodone-acetaminophen 10-325 mg 1 tablet by mouth every 6 hours as needed for pain, dispense #120/month. We will continue this prescription at this dose and number of tablets each month. We will reassess its efficacy at the follow-up visit.

## 2014-08-13 NOTE — Assessment & Plan Note (Signed)
At the last visit we had changed the sulindac to meloxicam because the patient subjectively felt the meloxicam may be slightly better than the sulindac in the management of his chronic posttraumatic left knee osteoarthritic pain. While on the meloxicam he felt the pain was, in fact, not better controlled than on the sulindac. We therefore discontinued the meloxicam today and restarted the sulindac at 200 mg by mouth twice daily as needed for left posttraumatic knee osteoarthritis. He is aware that he should be taking this medication with food.

## 2014-08-13 NOTE — Assessment & Plan Note (Signed)
Despite a recent decrease in his metoprolol dose from 100 mg by mouth twice daily to 50 mg by mouth twice daily he has not noticed an increase in his occasional stable angina which has been responsive to a rare nitroglycerin. We will therefore continue the metoprolol at 50 mg by mouth twice daily and the as needed nitroglycerin. We will also continue to aggressively address his cardiac risk factors. Because he is on chronic Coumadin he has not been on aspirin.

## 2014-08-13 NOTE — Assessment & Plan Note (Addendum)
His blood pressure is slightly elevated today at 142/83. This is on metoprolol 50 mg by mouth twice daily, valsartan 325 mg by mouth daily, and terazosin 5 mg by mouth each night. We will continue this regimen at these doses and recheck the blood pressure at the follow-up visit.

## 2014-08-13 NOTE — Assessment & Plan Note (Signed)
He has noted no symptomatic improvement in his chronic dyspnea on the Symbicort and albuterol. I believe that most of his dyspnea, now class IV, is related to his chronic diastolic heart failure. We therefore discontinued the Symbicort. We will continue the albuterol as needed given the stage II obstruction seen on a previous pulmonary function test.

## 2014-08-13 NOTE — Assessment & Plan Note (Signed)
On the esomeprazole he is without symptoms of heartburn but notes a sticking sensation in the epigastric region when he drinks any water or occasionally when he eats food. We therefore ordered a barium swallow to assess for evidence of a stricture in this location given his history of acid reflux. Further evaluation is pending the results of this study. In the meantime, we will continue the esomeprazole 40 mg by mouth daily.

## 2014-08-13 NOTE — Assessment & Plan Note (Signed)
He has been seeing wound care for his venous stasis ulcer of the left lower extremity. They have been wrapping the left lower extremity in an Unna boot in order to control the swelling. He will continue to follow with wound care. He will also continue the Lasix at 80 mg by mouth every morning.

## 2014-08-13 NOTE — Assessment & Plan Note (Addendum)
A hemoglobin A1c today was 6.9. Although this is within target it is slightly elevated above the previous level 3 months ago at 6.5. This is on diet alone. He was praised on keeping his diabetes within target range. We will repeat the hemoglobin A1c in 3 months to assure that he remains with adequate control. He is otherwise up-to-date on his diabetic health care maintenance but a urine for microalbumin was checked at this visit to see if that responded to the increased dose of the valsartan made at the last visit. This result is pending at the time of this dictation.

## 2014-08-13 NOTE — Assessment & Plan Note (Signed)
On exam he remains irregularly irregular which is consistent with chronic atrial fibrillation. His rate is controlled on the metoprolol 50 mg by mouth twice daily. Once again digoxin was added to his medication list between visits even though it has been removed by me 3 times and he is not taking it. I removed it once again and am hopeful that it is not inadvertently placed back on his list. If the blood pressure remained slightly elevated and we decided to increase the metoprolol back to 100 mg by mouth twice daily we would need to get a Holter monitor first to assure he had no further episodes of nocturnal bradycardia. At this point his systolic blood pressure is only 2 mmHg above the target and we will continue the antihypertensive regimen at this time without increasing the metoprolol dose. Anticoagulation continues to be adjusted by Dr. Elie Confer in the anticoagulation clinic.

## 2014-08-13 NOTE — Assessment & Plan Note (Signed)
He is tolerating the atorvastatin 10 mg by mouth daily without difficulty. At the last visit his LDL was checked and was found to be low on the 20 mg dose. The atorvastatin dose was therefore decreased to 10 mg daily. A lipid panel was checked during this visit to assure the LDL was not excessively low. The result is pending at the time of this dictation.

## 2014-08-13 NOTE — Patient Instructions (Signed)
It was good to see you today.  I am sorry your breathing is not any better.  1) Stop the symbacort as it does not appear to have worked.  2) Stop the meloxicam as it did not work as well as the sulindac.  I re-prescribed the sulindac 200 mg twice daily as needed for pain (take with food).  3) The digoxin was removed from your list for the fourth time.  Continue to not take it even if someone inappropriately puts it back on your list.  4) Keep taking your other medications as you are.  5) Do not purchase the Lavestra.  There are no studies to show that it works.  6) I will get a barium swallow to look for a narrowing that explains why food and water seem to be getting stuck.  7) I filled out paperwork for your insurance company regarding your disability.  I will see you back in 3 months to recheck your blood pressure and diabetes.  Sooner if necessary.

## 2014-08-13 NOTE — Progress Notes (Signed)
   Subjective:    Patient ID: Juan Calderon, male    DOB: 1955-12-12, 58 y.o.   MRN: [id number]  HPI  Please see the A&P for the status of the pt's chronic medical problems.  Review of Systems  Constitutional: Negative for activity change, appetite change and unexpected weight change.  Respiratory: Positive for apnea and shortness of breath. Negative for cough, chest tightness and wheezing.   Cardiovascular: Positive for leg swelling. Negative for chest pain and palpitations.  Gastrointestinal: Positive for constipation. Negative for nausea, vomiting, abdominal pain, diarrhea and abdominal distention.  Musculoskeletal: Positive for arthralgias, gait problem and neck pain. Negative for myalgias and joint swelling.  Skin: Positive for color change. Negative for rash.  Neurological: Negative for dizziness, syncope, weakness and light-headedness.     Objective:   Physical Exam  Constitutional: He is oriented to person, place, and time. He appears well-developed and well-nourished. No distress.  HENT:  Head: Normocephalic and atraumatic.  Cardiovascular: Normal rate.  Exam reveals no gallop and no friction rub.   Murmur heard. Irregular rhythm.  Crescendo-decrescendo systolic murmur at LUSB c/w aortic stenosis  Pulmonary/Chest: Effort normal and breath sounds normal. No respiratory distress. He has no wheezes. He has no rales.  Abdominal: Soft. Bowel sounds are normal. He exhibits no distension. There is no tenderness. There is no rebound and no guarding.  Musculoskeletal: Normal range of motion.  Neurological: He is alert and oriented to person, place, and time. He exhibits normal muscle tone.  Skin: Skin is warm and dry. No rash noted. He is not diaphoretic. No erythema.  Psychiatric: He has a normal mood and affect. His behavior is normal. Judgment and thought content normal.  Nursing note and vitals reviewed.     Assessment & Plan:   Please see problem oriented charting.

## 2014-08-13 NOTE — Assessment & Plan Note (Signed)
His dyspnea on exertion has slightly worsened in the interim. He was admitted for decompensation of his chronic diastolic heart failure and was diuresed to a dry weight of 304 pounds. His weight today is up 1 pound to 305. I believe his dyspnea on exertion, and for that matter dyspnea at rest, is related more so to his diastolic heart failure then to any underlying pulmonary problem. We will continue the Lasix at 80 mg by mouth daily and his antihypertensive regimen to keep control of his afterload. He is to follow his weight daily at home and call if it increases by 3-5 pounds so that further adjustments in his diuretic regimen can be made. A basic metabolic panel was drawn at today's visit and is pending at the time of this dictation.

## 2014-08-14 LAB — MICROALBUMIN / CREATININE URINE RATIO
Creatinine, Urine: 95.1 mg/dL
Microalb Creat Ratio: 82 mg/g — ABNORMAL HIGH (ref 0.0–30.0)
Microalb, Ur: 7.8 mg/dL — ABNORMAL HIGH (ref <2.0)

## 2014-08-14 NOTE — Progress Notes (Signed)
BMP: K 3.8, BUN 25, Cr 1,28, eGFR 70  Electrolytes and renal function stable on the higher dose of lasix.  Will continue lasix 80 mg daily for the chronic diastolic heart failure.  microalbumin 7.8, microalbumin/creatinine ratio 82  Continued microalbuminuria despite maximal dose ARB therapy.  We will continue aggressive risk factor modification of his diabetes and cardiovascular risk factors.  I am hesitant to start verapamil or diltiazem for his microalbuminuria given the need for beta-blockade and the nocturnal bradycardia seen during the recent admission.  This may need to be readdressed moving forward if the renal function further deteriorates.  Total cholesterol 108 Triglycerides 93 HDL 55 LDL 34  The above lipid panel is on atorvastatin 10 mg daily.  Low LDL is improved but still below 40.  That being said, I am hesitant to lower the atorvastatin dose further as high intensity statin therapy would be advisable and lowering the dose too far would undo this level of therapy.  LDL is close enough to 40 and his other issues, including his diabetes and microalbuminuria, necessitate aggressive risk factor modification.

## 2014-08-18 DIAGNOSIS — L97929 Non-pressure chronic ulcer of unspecified part of left lower leg with unspecified severity: Secondary | ICD-10-CM | POA: Diagnosis not present

## 2014-08-18 DIAGNOSIS — I87332 Chronic venous hypertension (idiopathic) with ulcer and inflammation of left lower extremity: Secondary | ICD-10-CM | POA: Diagnosis not present

## 2014-08-20 ENCOUNTER — Ambulatory Visit (INDEPENDENT_AMBULATORY_CARE_PROVIDER_SITE_OTHER): Payer: Medicare Other | Admitting: Pharmacist

## 2014-08-20 DIAGNOSIS — I4891 Unspecified atrial fibrillation: Secondary | ICD-10-CM

## 2014-08-20 DIAGNOSIS — Z7901 Long term (current) use of anticoagulants: Secondary | ICD-10-CM | POA: Diagnosis not present

## 2014-08-20 DIAGNOSIS — I482 Chronic atrial fibrillation, unspecified: Secondary | ICD-10-CM

## 2014-08-20 DIAGNOSIS — Z5181 Encounter for therapeutic drug level monitoring: Secondary | ICD-10-CM

## 2014-08-20 LAB — POCT INR: INR: 2.9

## 2014-08-21 ENCOUNTER — Other Ambulatory Visit: Payer: Self-pay | Admitting: Internal Medicine

## 2014-08-21 DIAGNOSIS — I482 Chronic atrial fibrillation, unspecified: Secondary | ICD-10-CM

## 2014-08-25 DIAGNOSIS — L97929 Non-pressure chronic ulcer of unspecified part of left lower leg with unspecified severity: Secondary | ICD-10-CM | POA: Diagnosis not present

## 2014-08-25 DIAGNOSIS — I87332 Chronic venous hypertension (idiopathic) with ulcer and inflammation of left lower extremity: Secondary | ICD-10-CM | POA: Diagnosis not present

## 2014-08-25 LAB — GLUCOSE, CAPILLARY: Glucose-Capillary: 183 mg/dL — ABNORMAL HIGH (ref 70–99)

## 2014-08-31 ENCOUNTER — Telehealth: Payer: Self-pay | Admitting: *Deleted

## 2014-08-31 NOTE — Telephone Encounter (Signed)
Patient called stating that he has not been able to find a company that will bill his insurance for his thigh high compression stockings. He states that he will wear the knee high compression stockings until he finds a company that will work with his insurance. I explained to him that Dr Bridgett Larsson had ordered the thigh high and that is the one preferred. He voiced understanding of the importance of the thigh highs but is unable to purchase these at this time.

## 2014-09-17 ENCOUNTER — Ambulatory Visit (INDEPENDENT_AMBULATORY_CARE_PROVIDER_SITE_OTHER): Payer: Medicare Other

## 2014-09-17 DIAGNOSIS — Z7901 Long term (current) use of anticoagulants: Secondary | ICD-10-CM

## 2014-09-17 DIAGNOSIS — Z5181 Encounter for therapeutic drug level monitoring: Secondary | ICD-10-CM | POA: Diagnosis not present

## 2014-09-17 DIAGNOSIS — I482 Chronic atrial fibrillation, unspecified: Secondary | ICD-10-CM

## 2014-09-17 DIAGNOSIS — I4891 Unspecified atrial fibrillation: Secondary | ICD-10-CM

## 2014-09-17 LAB — POCT INR: INR: 3.5

## 2014-09-26 ENCOUNTER — Emergency Department (HOSPITAL_COMMUNITY)
Admission: EM | Admit: 2014-09-26 | Discharge: 2014-09-26 | Disposition: A | Discharge status: Home / Self Care | Payer: Medicare Other | Attending: Emergency Medicine | Admitting: Emergency Medicine

## 2014-09-26 ENCOUNTER — Emergency Department (HOSPITAL_COMMUNITY): Payer: Medicare Other

## 2014-09-26 ENCOUNTER — Encounter (HOSPITAL_COMMUNITY): Payer: Self-pay | Admitting: Family Medicine

## 2014-09-26 DIAGNOSIS — N189 Chronic kidney disease, unspecified: Secondary | ICD-10-CM | POA: Insufficient documentation

## 2014-09-26 DIAGNOSIS — I5032 Chronic diastolic (congestive) heart failure: Secondary | ICD-10-CM | POA: Insufficient documentation

## 2014-09-26 DIAGNOSIS — Z7901 Long term (current) use of anticoagulants: Secondary | ICD-10-CM | POA: Diagnosis not present

## 2014-09-26 DIAGNOSIS — I129 Hypertensive chronic kidney disease with stage 1 through stage 4 chronic kidney disease, or unspecified chronic kidney disease: Secondary | ICD-10-CM | POA: Insufficient documentation

## 2014-09-26 DIAGNOSIS — E785 Hyperlipidemia, unspecified: Secondary | ICD-10-CM | POA: Insufficient documentation

## 2014-09-26 DIAGNOSIS — M109 Gout, unspecified: Secondary | ICD-10-CM | POA: Insufficient documentation

## 2014-09-26 DIAGNOSIS — I2510 Atherosclerotic heart disease of native coronary artery without angina pectoris: Secondary | ICD-10-CM | POA: Diagnosis not present

## 2014-09-26 DIAGNOSIS — R059 Cough, unspecified: Secondary | ICD-10-CM

## 2014-09-26 DIAGNOSIS — R05 Cough: Secondary | ICD-10-CM | POA: Insufficient documentation

## 2014-09-26 DIAGNOSIS — K219 Gastro-esophageal reflux disease without esophagitis: Secondary | ICD-10-CM | POA: Diagnosis not present

## 2014-09-26 DIAGNOSIS — J441 Chronic obstructive pulmonary disease with (acute) exacerbation: Secondary | ICD-10-CM | POA: Insufficient documentation

## 2014-09-26 DIAGNOSIS — G894 Chronic pain syndrome: Secondary | ICD-10-CM | POA: Diagnosis not present

## 2014-09-26 DIAGNOSIS — E119 Type 2 diabetes mellitus without complications: Secondary | ICD-10-CM | POA: Insufficient documentation

## 2014-09-26 DIAGNOSIS — Z8619 Personal history of other infectious and parasitic diseases: Secondary | ICD-10-CM | POA: Insufficient documentation

## 2014-09-26 DIAGNOSIS — H409 Unspecified glaucoma: Secondary | ICD-10-CM | POA: Insufficient documentation

## 2014-09-26 DIAGNOSIS — J811 Chronic pulmonary edema: Secondary | ICD-10-CM | POA: Diagnosis not present

## 2014-09-26 DIAGNOSIS — Z79899 Other long term (current) drug therapy: Secondary | ICD-10-CM | POA: Diagnosis not present

## 2014-09-26 DIAGNOSIS — J029 Acute pharyngitis, unspecified: Secondary | ICD-10-CM | POA: Diagnosis not present

## 2014-09-26 LAB — BASIC METABOLIC PANEL
ANION GAP: 7 (ref 5–15)
BUN: 13 mg/dL (ref 6–23)
CHLORIDE: 98 mmol/L (ref 96–112)
CO2: 33 mmol/L — ABNORMAL HIGH (ref 19–32)
Calcium: 9.3 mg/dL (ref 8.4–10.5)
Creatinine, Ser: 1.28 mg/dL (ref 0.50–1.35)
GFR calc Af Amer: 69 mL/min — ABNORMAL LOW (ref >90)
GFR calc non Af Amer: 59 mL/min — ABNORMAL LOW (ref >90)
Glucose, Bld: 142 mg/dL — ABNORMAL HIGH (ref 70–99)
POTASSIUM: 3.3 mmol/L — AB (ref 3.5–5.1)
Sodium: 138 mmol/L (ref 135–145)

## 2014-09-26 LAB — CBC
HEMATOCRIT: 37.4 % — AB (ref 39.0–52.0)
Hemoglobin: 12.2 g/dL — ABNORMAL LOW (ref 13.0–17.0)
MCH: 29.5 pg (ref 26.0–34.0)
MCHC: 32.6 g/dL (ref 30.0–36.0)
MCV: 90.6 fL (ref 78.0–100.0)
PLATELETS: 225 10*3/uL (ref 150–400)
RBC: 4.13 MIL/uL — ABNORMAL LOW (ref 4.22–5.81)
RDW: 14.9 % (ref 11.5–15.5)
WBC: 5.5 10*3/uL (ref 4.0–10.5)

## 2014-09-26 LAB — I-STAT TROPONIN, ED: TROPONIN I, POC: 0.01 ng/mL (ref 0.00–0.08)

## 2014-09-26 LAB — PROTIME-INR
INR: 3.11 — ABNORMAL HIGH (ref 0.00–1.49)
Prothrombin Time: 32.2 seconds — ABNORMAL HIGH (ref 11.6–15.2)

## 2014-09-26 LAB — BRAIN NATRIURETIC PEPTIDE: B Natriuretic Peptide: 119.2 pg/mL — ABNORMAL HIGH (ref 0.0–100.0)

## 2014-09-26 MED ORDER — AMOXICILLIN 500 MG PO CAPS: 500.0000 mg | ORAL_CAPSULE | Freq: Three times a day (TID) | ORAL | Status: DC

## 2014-09-26 MED ORDER — HYDROCOD POLST-CHLORPHEN POLST 10-8 MG/5ML PO LQCR: 5.0000 mL | Freq: Two times a day (BID) | ORAL | Status: DC | PRN

## 2014-09-26 NOTE — ED Provider Notes (Signed)
CSN: [id number]     Arrival date & time 09/26/14  1248 History   First MD Initiated Contact with Patient 09/26/14 1350     Chief Complaint  Patient presents with  . Cough     (Consider location/radiation/quality/duration/timing/severity/associated sxs/prior Treatment) HPI  59 year old male presents with cough, congestion, and shortness of breath for the past 1 week. Cough has been productive. No fevers or chills. Has chest pain when walking but this is not different than normal. This is not occurring at an earlier onset than normal. Has not noticed any increased leg swelling or weight gain. Has history of CHF but does not feel that this is causing his cough and shortness of breath. No headaches.  Past Medical History  Diagnosis Date  . Essential hypertension 09/20/2011  . Atrial fibrillation 02/04/2012  . Chronic diastolic heart failure 02/08/5915    with mild left ventricular hypertrophy on Echo 02/2010  . Chronic pain syndrome 01/15/2013    Left arm and leg s/p traumatic injury   . Osteoarthritis cervical spine 04/25/2013  . Gastroesophageal reflux disease 04/25/2013  . Open-angle glaucoma 04/25/2013  . 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 04/25/2013  . Hyperlipidemia LDL goal < 100 04/25/2013  . Coronary artery disease 04/25/2013    25% LAD stenosis on cath 2007.  Stable angina.  . Asthma, chronic 04/25/2013    Clinical diagnosis  . Gout 04/25/2013  . Right rotator cuff tear 04/25/2013    Large full-thickness tear of the supraspinatus with mild retraction but no atrophy   . Vasomotor rhinitis 04/25/2013  . Obstructive sleep apnea 06/01/2013    Moderate, AHI 29.8 per hour with moderately loud snoring and oxygen desaturation to a nadir of 79%. CPAP titration resulted in a prescription for 17 CWP.    [name] Ulcer     History of ulcer in the remote past  . Cataract     Left eye  . Onychomycosis of toenail 12/24/2012    Left great toe   . Morbid obesity with BMI of  40.0-44.9, adult 04/25/2013  . Blood transfusion without reported diagnosis   . Chronic obstructive pulmonary disease 04/25/2013  . Diverticulosis 11/12/2013  . Osteoarthritis of left knee 06/19/2013    Tricompartmental disease.  Treated with double hinged upright knee brace, steroid/xylocaine knee injections, and NSAIDs   . CHF (congestive heart failure)   . Shortness of breath   . Chronic renal insufficiency 07/12/2014   Past Surgical History  Procedure Laterality Date  . Left leg surgery    . Left arm surgery    . Shoulder surgery      Right  . Fracture surgery Left 1980's    Elbow   Family History  Problem Relation Age of Onset  . Heart failure Mother   . Alzheimer's disease Father   . Healthy Sister   . Healthy Brother   . Healthy Son   . Healthy Sister   . Healthy Sister   . Healthy Sister   . Healthy Sister   . Healthy Brother   . Healthy Brother   . Healthy Brother   . Heart failure Brother   . Healthy Brother   . Osteoarthritis Brother   . Prostate cancer Brother   . Early death Brother     Gun Shot Wound   History  Substance Use Topics  . Smoking status: Never Smoker   . Smokeless tobacco: Never Used  . Alcohol Use: 1.0 oz/week    2 drink(s) per  week     Comment: Liquor.    Review of Systems  Constitutional: Negative for fever and chills.  HENT: Positive for sore throat.   Respiratory: Positive for cough and shortness of breath.   Cardiovascular: Negative for chest pain and leg swelling.  Gastrointestinal: Negative for vomiting.  Neurological: Negative for headaches.  All other systems reviewed and are negative.     Allergies  Ramipril and Testosterone  Home Medications   Prior to Admission medications   Medication Sig Start Date End Date Taking? Authorizing Provider  albuterol (PROAIR HFA) 108 (90 BASE) MCG/ACT inhaler Inhale 2 puffs into the lungs every 6 (six) hours as needed for wheezing or shortness of breath.    Karren Cobble, MD   allopurinol (ZYLOPRIM) 300 MG tablet Take 1 tablet (300 mg total) by mouth daily. 02/11/14   Karren Cobble, MD  atorvastatin (LIPITOR) 10 MG tablet Take 1 tablet (10 mg total) by mouth daily. 05/20/14   Karren Cobble, MD  esomeprazole (NEXIUM) 40 MG capsule Take 1 capsule (40 mg total) by mouth daily at 12 noon. 02/11/14   Karren Cobble, MD  fenofibrate 160 MG tablet Take 1 tablet (160 mg total) by mouth daily. 02/11/14   Karren Cobble, MD  furosemide (LASIX) 80 MG tablet Take 1 tablet (80 mg total) by mouth daily. 05/13/14   Karren Cobble, MD  latanoprost (XALATAN) 0.005 % ophthalmic solution Place 1 drop into both eyes at bedtime. 02/11/14   Karren Cobble, MD  metoprolol (LOPRESSOR) 50 MG tablet Take 1 tablet (50 mg total) by mouth 2 (two) times daily. 06/20/14   Brittainy Erie Noe, PA-C  nitroGLYCERIN (NITROSTAT) 0.4 MG SL tablet Place 0.4 mg under the tongue every 5 (five) minutes as needed for chest pain.    Historical Provider, MD  oxyCODONE-acetaminophen (PERCOCET) 10-325 MG per tablet Take 1 tablet by mouth every 6 (six) hours as needed for pain. 08/13/14 10/16/14  Karren Cobble, MD  sulindac (CLINORIL) 200 MG tablet Take 1 tablet (200 mg total) by mouth 2 (two) times daily as needed. 08/13/14   Karren Cobble, MD  terazosin (HYTRIN) 5 MG capsule Take 1 capsule (5 mg total) by mouth at bedtime. 07/07/14   Karren Cobble, MD  valsartan (DIOVAN) 320 MG tablet Take 1 tablet (320 mg total) by mouth daily. 05/13/14   Karren Cobble, MD  warfarin (COUMADIN) 5 MG tablet TAKE 1/2 TABLET BY MOUTH EVERY DAY EXCEPT ON SUNDAYS, TUESDAYS, AND THURSDAYS TAKE 1 TABLET 08/24/14   Karren Cobble, MD   BP 104/73 mmHg  Pulse 101  Temp(Src) 97.6 F (36.4 C)  Resp 20  SpO2 90% Physical Exam  Constitutional: He is oriented to person, place, and time. He appears well-developed and well-nourished. No distress.  Morbidly obese  HENT:  Head: Normocephalic and atraumatic.  Right Ear:  External ear normal.  Left Ear: External ear normal.  Nose: Nose normal.  Eyes: Right eye exhibits no discharge. Left eye exhibits no discharge.  Neck: Neck supple.  Cardiovascular: Normal rate, regular rhythm, normal heart sounds and intact distal pulses.   Pulmonary/Chest: Effort normal and breath sounds normal. He has no wheezes.  Abdominal: Soft. There is no tenderness.  Musculoskeletal: He exhibits no edema.  Large lower extremities, equal bilaterally without pitting edema  Neurological: He is alert and oriented to person, place, and time.  Skin: Skin is warm and dry. He is not diaphoretic.  Nursing note and  vitals reviewed.   ED Course  Procedures (including critical care time) Labs Review Labs Reviewed  CBC - Abnormal; Notable for the following:    RBC 4.13 (*)    Hemoglobin 12.2 (*)    HCT 37.4 (*)    All other components within normal limits  BASIC METABOLIC PANEL - Abnormal; Notable for the following:    Potassium 3.3 (*)    CO2 33 (*)    Glucose, Bld 142 (*)    GFR calc non Af Amer 59 (*)    GFR calc Af Amer 69 (*)    All other components within normal limits  BRAIN NATRIURETIC PEPTIDE - Abnormal; Notable for the following:    B Natriuretic Peptide 119.2 (*)    All other components within normal limits  PROTIME-INR - Abnormal; Notable for the following:    Prothrombin Time 32.2 (*)    INR 3.11 (*)    All other components within normal limits  I-STAT TROPOININ, ED    Imaging Review Dg Chest 2 View  09/26/2014   CLINICAL DATA:  Productive cough for 1 week  EXAM: CHEST  2 VIEW  COMPARISON:  06/18/2014  FINDINGS: Cardiac shadow remains enlarged. The lungs are well aerated bilaterally. No focal infiltrate or sizable effusion is seen. Mild vascular congestion is again noted.  IMPRESSION: Stable vascular congestion.  No acute abnormality is seen.   Electronically Signed   By: Inez Catalina M.D.   On: 09/26/2014 14:03     EKG Interpretation   Date/Time:  Sunday  September 26 2014 13:02:03 EST Ventricular Rate:  94 PR Interval:    QRS Duration: 92 QT Interval:  382 QTC Calculation: 477 R Axis:   45 Text Interpretation:  Atrial fibrillation with premature ventricular or  aberrantly conducted complexes Abnormal ECG T wave changes resolved  compared to 2013 Confirmed by Artesha Wemhoff  MD, [place] ([id number]) on 09/26/2014  2:38:35 PM      MDM   Final diagnoses:  Cough    Patient does not appear dyspneic at this time, has baseline O2 sats and remains above 92%. No signs of acute CHF, appears to have viral syndrome vs atypical pneumonia. Will cover with antibiotics, and given benign bloodwork and Xray, after discussion with patient will treat as outpatient. Does not appear to warrant admission at this time, but did discuss strict return precautions with patient.     Ephraim Hamburger, MD 09/26/14 [phone]

## 2014-09-26 NOTE — ED Notes (Signed)
Pt sts productive cough x a few days.[name] sts when he lays down he feels like he is choking.

## 2014-09-26 NOTE — Progress Notes (Signed)
I have assessed this pt with Dr. [name] at bedside.  BIL BS are clear.  Dr. [name] and I both agree that this is not a bronchospasm issue.  No treatments have been ordered at this time.  Dr. [name] aware that RT is here if any further assessment or tx needed.

## 2014-09-26 NOTE — Discharge Instructions (Signed)
Cough, Adult ° A cough is a reflex that helps clear your throat and airways. It can help heal the body or may be a reaction to an irritated airway. A cough may only last 2 or 3 weeks (acute) or may last more than 8 weeks (chronic).  °CAUSES °Acute cough: °· Viral or bacterial infections. °Chronic cough: °· Infections. °· Allergies. °· Asthma. °· Post-nasal drip. °· Smoking. °· Heartburn or acid reflux. °· Some medicines. °· Chronic lung problems (COPD). °· Cancer. °SYMPTOMS  °· Cough. °· Fever. °· Chest pain. °· Increased breathing rate. °· High-pitched whistling sound when breathing (wheezing). °· Colored mucus that you cough up (sputum). °TREATMENT  °· A bacterial cough may be treated with antibiotic medicine. °· A viral cough must run its course and will not respond to antibiotics. °· Your caregiver may recommend other treatments if you have a chronic cough. °HOME CARE INSTRUCTIONS  °· Only take over-the-counter or prescription medicines for pain, discomfort, or fever as directed by your caregiver. Use cough suppressants only as directed by your caregiver. °· Use a cold steam vaporizer or humidifier in your bedroom or home to help loosen secretions. °· Sleep in a semi-upright position if your cough is worse at night. °· Rest as needed. °· Stop smoking if you smoke. °SEEK IMMEDIATE MEDICAL CARE IF:  °· You have pus in your sputum. °· Your cough starts to worsen. °· You cannot control your cough with suppressants and are losing sleep. °· You begin coughing up blood. °· You have difficulty breathing. °· You develop pain which is getting worse or is uncontrolled with medicine. °· You have a fever. °MAKE SURE YOU:  °· Understand these instructions. °· Will watch your condition. °· Will get help right away if you are not doing well or get worse. °Document Released: 02/16/2011 Document Revised: 11/12/2011 Document Reviewed: 02/16/2011 °ExitCare® Patient Information ©2015 [employer]. This information is not intended  to replace advice given to you by your health care provider. Make sure you discuss any questions you have with your health care provider. °Upper Respiratory Infection, Adult °An upper respiratory infection (URI) is also sometimes known as the common cold. The upper respiratory tract includes the nose, sinuses, throat, trachea, and bronchi. Bronchi are the airways leading to the lungs. Most people improve within 1 week, but symptoms can last up to 2 weeks. A residual cough may last even longer.  °CAUSES °Many different viruses can infect the tissues lining the upper respiratory tract. The tissues become irritated and inflamed and often become very moist. Mucus production is also common. A cold is contagious. You can easily spread the virus to others by oral contact. This includes kissing, sharing a glass, coughing, or sneezing. Touching your mouth or nose and then touching a surface, which is then touched by another person, can also spread the virus. °SYMPTOMS  °Symptoms typically develop 1 to 3 days after you come in contact with a cold virus. Symptoms vary from person to person. They may include: °· Runny nose. °· Sneezing. °· Nasal congestion. °· Sinus irritation. °· Sore throat. °· Loss of voice (laryngitis). °· Cough. °· Fatigue. °· Muscle aches. °· Loss of appetite. °· Headache. °· Low-grade fever. °DIAGNOSIS  °You might diagnose your own cold based on familiar symptoms, since most people get a cold 2 to 3 times a year. Your caregiver can confirm this based on your exam. Most importantly, your caregiver can check that your symptoms are not due to another disease such   as strep throat, sinusitis, pneumonia, asthma, or epiglottitis. Blood tests, throat tests, and X-rays are not necessary to diagnose a common cold, but they may sometimes be helpful in excluding other more serious diseases. Your caregiver will decide if any further tests are required. °RISKS AND COMPLICATIONS  °You may be at risk for a more severe  case of the common cold if you smoke cigarettes, have chronic heart disease (such as heart failure) or lung disease (such as asthma), or if you have a weakened immune system. The very young and very old are also at risk for more serious infections. Bacterial sinusitis, middle ear infections, and bacterial pneumonia can complicate the common cold. The common cold can worsen asthma and chronic obstructive pulmonary disease (COPD). Sometimes, these complications can require emergency medical care and may be life-threatening. °PREVENTION  °The best way to protect against getting a cold is to practice good hygiene. Avoid oral or hand contact with people with cold symptoms. Wash your hands often if contact occurs. There is no clear evidence that vitamin C, vitamin E, echinacea, or exercise reduces the chance of developing a cold. However, it is always recommended to get plenty of rest and practice good nutrition. °TREATMENT  °Treatment is directed at relieving symptoms. There is no cure. Antibiotics are not effective, because the infection is caused by a virus, not by bacteria. Treatment may include: °· Increased fluid intake. Sports drinks offer valuable electrolytes, sugars, and fluids. °· Breathing heated mist or steam (vaporizer or shower). °· Eating chicken soup or other clear broths, and maintaining good nutrition. °· Getting plenty of rest. °· Using gargles or lozenges for comfort. °· Controlling fevers with ibuprofen or acetaminophen as directed by your caregiver. °· Increasing usage of your inhaler if you have asthma. °Zinc gel and zinc lozenges, taken in the first 24 hours of the common cold, can shorten the duration and lessen the severity of symptoms. Pain medicines may help with fever, muscle aches, and throat pain. A variety of non-prescription medicines are available to treat congestion and runny nose. Your caregiver can make recommendations and may suggest nasal or lung inhalers for other symptoms.  °HOME  CARE INSTRUCTIONS  °· Only take over-the-counter or prescription medicines for pain, discomfort, or fever as directed by your caregiver. °· Use a warm mist humidifier or inhale steam from a shower to increase air moisture. This may keep secretions moist and make it easier to breathe. °· Drink enough water and fluids to keep your urine clear or pale yellow. °· Rest as needed. °· Return to work when your temperature has returned to normal or as your caregiver advises. You may need to stay home longer to avoid infecting others. You can also use a face mask and careful hand washing to prevent spread of the virus. °SEEK MEDICAL CARE IF:  °· After the first few days, you feel you are getting worse rather than better. °· You need your caregiver's advice about medicines to control symptoms. °· You develop chills, worsening shortness of breath, or brown or red sputum. These may be signs of pneumonia. °· You develop yellow or brown nasal discharge or pain in the face, especially when you bend forward. These may be signs of sinusitis. °· You develop a fever, swollen neck glands, pain with swallowing, or white areas in the back of your throat. These may be signs of strep throat. °SEEK IMMEDIATE MEDICAL CARE IF:  °· You have a fever. °· You develop severe or persistent headache, ear   pain, sinus pain, or chest pain. °· You develop wheezing, a prolonged cough, cough up blood, or have a change in your usual mucus (if you have chronic lung disease). °· You develop sore muscles or a stiff neck. °Document Released: 02/13/2001 Document Revised: 11/12/2011 Document Reviewed: 11/25/2013 °ExitCare® Patient Information ©2015 [employer]. This information is not intended to replace advice given to you by your health care provider. Make sure you discuss any questions you have with your health care provider. ° °

## 2014-10-08 ENCOUNTER — Ambulatory Visit (INDEPENDENT_AMBULATORY_CARE_PROVIDER_SITE_OTHER): Payer: Self-pay | Admitting: Interventional Cardiology

## 2014-10-08 ENCOUNTER — Ambulatory Visit (INDEPENDENT_AMBULATORY_CARE_PROVIDER_SITE_OTHER): Payer: Medicare Other | Admitting: Internal Medicine

## 2014-10-08 ENCOUNTER — Encounter: Payer: Self-pay | Admitting: Internal Medicine

## 2014-10-08 VITALS — BP 125/72 | HR 91 | Temp 97.5°F | Wt 298.9 lb

## 2014-10-08 DIAGNOSIS — I482 Chronic atrial fibrillation, unspecified: Secondary | ICD-10-CM

## 2014-10-08 DIAGNOSIS — I10 Essential (primary) hypertension: Secondary | ICD-10-CM | POA: Diagnosis not present

## 2014-10-08 DIAGNOSIS — Z5181 Encounter for therapeutic drug level monitoring: Secondary | ICD-10-CM

## 2014-10-08 DIAGNOSIS — Z789 Other specified health status: Secondary | ICD-10-CM | POA: Diagnosis not present

## 2014-10-08 DIAGNOSIS — J069 Acute upper respiratory infection, unspecified: Secondary | ICD-10-CM

## 2014-10-08 DIAGNOSIS — E876 Hypokalemia: Secondary | ICD-10-CM | POA: Diagnosis not present

## 2014-10-08 LAB — BASIC METABOLIC PANEL WITH GFR
BUN: 19 mg/dL (ref 6–23)
CALCIUM: 9.2 mg/dL (ref 8.4–10.5)
CO2: 31 mEq/L (ref 19–32)
CREATININE: 1.24 mg/dL (ref 0.50–1.35)
Chloride: 99 mEq/L (ref 96–112)
GFR, EST NON AFRICAN AMERICAN: 63 mL/min
GFR, Est African American: 73 mL/min
GLUCOSE: 158 mg/dL — AB (ref 70–99)
Potassium: 4.5 mEq/L (ref 3.5–5.3)
SODIUM: 140 meq/L (ref 135–145)

## 2014-10-08 LAB — POCT INR: INR: 3.4

## 2014-10-08 MED ORDER — GUAIFENESIN 200 MG PO TABS: 200.0000 mg | ORAL_TABLET | ORAL | Status: DC | PRN

## 2014-10-08 NOTE — Patient Instructions (Signed)
General Instructions:   Thank you for bringing your medicines today. This helps [place] keep you safe from mistakes.   FOR YOUR CONGESTION: -Try guaifenesin 200 mg (1 pill) every 4 hours for congestion -Try warm compresses and steamy showers -You can try over the counter ocean nasal spray   Your measurements today were as follows: ANKLE: 12.75 inches CALF: 19 inches THIGH: 28 inches  Upper Respiratory Infection, Adult An upper respiratory infection (URI) is also sometimes known as the common cold. The upper respiratory tract includes the nose, sinuses, throat, trachea, and bronchi. Bronchi are the airways leading to the lungs. Most people improve within 1 week, but symptoms can last up to 2 weeks. A residual cough may last even longer.  CAUSES Many different viruses can infect the tissues lining the upper respiratory tract. The tissues become irritated and inflamed and often become very moist. Mucus production is also common. A cold is contagious. You can easily spread the virus to others by oral contact. This includes kissing, sharing a glass, coughing, or sneezing. Touching your mouth or nose and then touching a surface, which is then touched by another person, can also spread the virus. SYMPTOMS  Symptoms typically develop 1 to 3 days after you come in contact with a cold virus. Symptoms vary from person to person. They may include:  Runny nose.  Sneezing.  Nasal congestion.  Sinus irritation.  Sore throat.  Loss of voice (laryngitis).  Cough.  Fatigue.  Muscle aches.  Loss of appetite.  Headache.  Low-grade fever. DIAGNOSIS  You might diagnose your own cold based on familiar symptoms, since most people get a cold 2 to 3 times a year. Your caregiver can confirm this based on your exam. Most importantly, your caregiver can check that your symptoms are not due to another disease such as strep throat, sinusitis, pneumonia, asthma, or epiglottitis. Blood tests, throat tests,  and X-rays are not necessary to diagnose a common cold, but they may sometimes be helpful in excluding other more serious diseases. Your caregiver will decide if any further tests are required. RISKS AND COMPLICATIONS  You may be at risk for a more severe case of the common cold if you smoke cigarettes, have chronic heart disease (such as heart failure) or lung disease (such as asthma), or if you have a weakened immune system. The very young and very old are also at risk for more serious infections. Bacterial sinusitis, middle ear infections, and bacterial pneumonia can complicate the common cold. The common cold can worsen asthma and chronic obstructive pulmonary disease (COPD). Sometimes, these complications can require emergency medical care and may be life-threatening. PREVENTION  The best way to protect against getting a cold is to practice good hygiene. Avoid oral or hand contact with people with cold symptoms. Wash your hands often if contact occurs. There is no clear evidence that vitamin C, vitamin E, echinacea, or exercise reduces the chance of developing a cold. However, it is always recommended to get plenty of rest and practice good nutrition. TREATMENT  Treatment is directed at relieving symptoms. There is no cure. Antibiotics are not effective, because the infection is caused by a virus, not by bacteria. Treatment may include:  Increased fluid intake. Sports drinks offer valuable electrolytes, sugars, and fluids.  Breathing heated mist or steam (vaporizer or shower).  Eating chicken soup or other clear broths, and maintaining good nutrition.  Getting plenty of rest.  Using gargles or lozenges for comfort.  Controlling fevers with ibuprofen  or acetaminophen as directed by your caregiver.  Increasing usage of your inhaler if you have asthma. Zinc gel and zinc lozenges, taken in the first 24 hours of the common cold, can shorten the duration and lessen the severity of symptoms. Pain  medicines may help with fever, muscle aches, and throat pain. A variety of non-prescription medicines are available to treat congestion and runny nose. Your caregiver can make recommendations and may suggest nasal or lung inhalers for other symptoms.  HOME CARE INSTRUCTIONS   Only take over-the-counter or prescription medicines for pain, discomfort, or fever as directed by your caregiver.  Use a warm mist humidifier or inhale steam from a shower to increase air moisture. This may keep secretions moist and make it easier to breathe.  Drink enough water and fluids to keep your urine clear or pale yellow.  Rest as needed.  Return to work when your temperature has returned to normal or as your caregiver advises. You may need to stay home longer to avoid infecting others. You can also use a face mask and careful hand washing to prevent spread of the virus. SEEK MEDICAL CARE IF:   After the first few days, you feel you are getting worse rather than better.  You need your caregiver's advice about medicines to control symptoms.  You develop chills, worsening shortness of breath, or brown or red sputum. These may be signs of pneumonia.  You develop yellow or brown nasal discharge or pain in the face, especially when you bend forward. These may be signs of sinusitis.  You develop a fever, swollen neck glands, pain with swallowing, or white areas in the back of your throat. These may be signs of strep throat. SEEK IMMEDIATE MEDICAL CARE IF:   You have a fever.  You develop severe or persistent headache, ear pain, sinus pain, or chest pain.  You develop wheezing, a prolonged cough, cough up blood, or have a change in your usual mucus (if you have chronic lung disease).  You develop sore muscles or a stiff neck. Document Released: 02/13/2001 Document Revised: 11/12/2011 Document Reviewed: 11/25/2013 [facility] Patient Information 2015 [place], [place]. This information is not intended to replace  advice given to you by your health care provider. Make sure you discuss any questions you have with your health care provider.

## 2014-10-09 NOTE — Progress Notes (Signed)
Subjective:    Patient ID: Juan Calderon, male    DOB: 1955/11/13, 59 y.o.   MRN: [id number]  HPI Juan Calderon is a 59 yo male with PMHx of chronic atrial fibrillation on coumadin, chronic diastolic heart failure, essential hypertension, chronic asthmatic bronchitis and type II diabetes with neuropathy who presents to the clinic for ED follow up. Please see problem based assessment and plan for details of visit.  Review of Systems General: Denies fever, chills, fatigue, and diaphoresis.  HEENT: Admits to nasal congestion. Denies sinus pressure, sore throat Respiratory: Denies SOB, cough and wheezing.   Cardiovascular: Denies chest pain  Gastrointestinal: Denies nausea, vomiting, abdominal pain, diarrhea Musculoskeletal: Denies myalgias  Skin: Denies rash   Neurological: Denies dizziness, headaches, weakness, lightheadedness  Past Medical History  Diagnosis Date  . Essential hypertension 09/20/2011  . Atrial fibrillation 02/04/2012  . Chronic diastolic heart failure 04/06/6961    with mild left ventricular hypertrophy on Echo 02/2010  . Chronic pain syndrome 01/15/2013    Left arm and leg s/p traumatic injury   . Osteoarthritis cervical spine 04/25/2013  . Gastroesophageal reflux disease 04/25/2013  . Open-angle glaucoma 04/25/2013  . 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 04/25/2013  . Hyperlipidemia LDL goal < 100 04/25/2013  . Coronary artery disease 04/25/2013    25% LAD stenosis on cath 2007.  Stable angina.  . Asthma, chronic 04/25/2013    Clinical diagnosis  . Gout 04/25/2013  . Right rotator cuff tear 04/25/2013    Large full-thickness tear of the supraspinatus with mild retraction but no atrophy   . Vasomotor rhinitis 04/25/2013  . Obstructive sleep apnea 06/01/2013    Moderate, AHI 29.8 per hour with moderately loud snoring and oxygen desaturation to a nadir of 79%. CPAP titration resulted in a prescription for 17 CWP.    [name] Ulcer     History of ulcer  in the remote past  . Cataract     Left eye  . Onychomycosis of toenail 12/24/2012    Left great toe   . Morbid obesity with BMI of 40.0-44.9, adult 04/25/2013  . Blood transfusion without reported diagnosis   . Chronic obstructive pulmonary disease 04/25/2013  . Diverticulosis 11/12/2013  . Osteoarthritis of left knee 06/19/2013    Tricompartmental disease.  Treated with double hinged upright knee brace, steroid/xylocaine knee injections, and NSAIDs   . CHF (congestive heart failure)   . Shortness of breath   . Chronic renal insufficiency 07/12/2014   Outpatient Encounter Prescriptions as of 10/08/2014  Medication Sig  . albuterol (PROAIR HFA) 108 (90 BASE) MCG/ACT inhaler Inhale 2 puffs into the lungs every 6 (six) hours as needed for wheezing or shortness of breath.  . allopurinol (ZYLOPRIM) 300 MG tablet Take 1 tablet (300 mg total) by mouth daily.  [name] amoxicillin (AMOXIL) 500 MG capsule Take 1 capsule (500 mg total) by mouth 3 (three) times daily.  [name] atorvastatin (LIPITOR) 10 MG tablet Take 1 tablet (10 mg total) by mouth daily.  . chlorpheniramine-HYDROcodone (TUSSIONEX) 10-8 MG/5ML LQCR Take 5 mLs by mouth every 12 (twelve) hours as needed for cough.  . esomeprazole (NEXIUM) 40 MG capsule Take 1 capsule (40 mg total) by mouth daily at 12 noon.  . fenofibrate 160 MG tablet Take 1 tablet (160 mg total) by mouth daily.  . furosemide (LASIX) 80 MG tablet Take 1 tablet (80 mg total) by mouth daily.  [name] guaiFENesin 200 MG tablet Take 1 tablet (200 mg total)  by mouth every 4 (four) hours as needed for cough or to loosen phlegm.  . latanoprost (XALATAN) 0.005 % ophthalmic solution Place 1 drop into both eyes at bedtime.  . metoprolol (LOPRESSOR) 50 MG tablet Take 1 tablet (50 mg total) by mouth 2 (two) times daily.  . nitroGLYCERIN (NITROSTAT) 0.4 MG SL tablet Place 0.4 mg under the tongue every 5 (five) minutes as needed for chest pain.  [name] oxyCODONE-acetaminophen (PERCOCET) 10-325 MG per tablet  Take 1 tablet by mouth every 6 (six) hours as needed for pain.  . sulindac (CLINORIL) 200 MG tablet Take 1 tablet (200 mg total) by mouth 2 (two) times daily as needed.  . terazosin (HYTRIN) 5 MG capsule Take 1 capsule (5 mg total) by mouth at bedtime.  . valsartan (DIOVAN) 320 MG tablet Take 1 tablet (320 mg total) by mouth daily.  [name] warfarin (COUMADIN) 5 MG tablet TAKE 1/2 TABLET BY MOUTH EVERY DAY EXCEPT ON SUNDAYS, TUESDAYS, AND THURSDAYS TAKE 1 TABLET      Objective:   Physical Exam Filed Vitals:   10/08/14 0952  BP: 125/72  Pulse: 91  Temp: 97.5 F (36.4 C)  TempSrc: Oral  Weight: 298 lb 14.4 oz (135.58 kg)  SpO2: 95%   General: Vital signs reviewed.  Patient is well-developed and well-nourished, in no acute distress and cooperative with exam.  HEENT: Conjunctivae normal, no injection or drainage. Nasal turbinates are edematous and erythematous. No sinus tenderness on palpation. Posterior oropharynx pink without drainage. No cervical lymphadenopathy.  Cardiovascular: Irregularly irregular. 3/6 systolic ejection murmur.  Pulmonary/Chest: Clear to auscultation bilaterally, no wheezes, rales, or rhonchi. Abdominal: Soft, non-tender, non-distended, BS + Neurological: A&O x3 Skin: Warm, dry and intact. No rashes or erythema. Psychiatric: Normal mood and affect. Speech and behavior is normal. Cognition and memory are normal.     Assessment & Plan:   Please see problem based assessment and plan.

## 2014-10-09 NOTE — Assessment & Plan Note (Signed)
BP Readings from Last 3 Encounters:  10/08/14 125/72  09/26/14 117/74  08/13/14 142/83    Lab Results  Component Value Date   NA 140 10/08/2014   K 4.5 10/08/2014   CREATININE 1.24 10/08/2014    Assessment: Blood pressure control:  Well controlled  Progress toward BP goal:   Improved Comments: Patient has been compliant with Metoprolol 50 mg BID, Valsartan 320 mg daily, and Terazosin 5 mg daily.  Plan: Medications:  continue current medications

## 2014-10-09 NOTE — Assessment & Plan Note (Signed)
Patient was recently seen in the ED on 09/26/14 with complaint of productive cough, nasal congestion, sore throat, and shortness of breath. Patient denied any fevers, chills, weight gain or edema at that time. Labs revealed no leukocytosis. Troponin negative at 0.01. BNP 119.2. CXR reveavled stable vascular congestion and no acute abnormalities. Patient was diagnosed with URI versus atypical pneumonia and discharged from the ED with amoxicillin 500 mg TID for 7 days and Tussionex. Patient completed course of amoxicillin, but did not pick up Tussionex due to price. Patient presented to the [facility] clinic for ED follow up. Patient is afebrile and states symptoms of cough, shortness of breath and sore throat have resolved. Patient continues to have nasal congestion and requests relief. Given history of hypertension, I will avoid giving decongestants. Patient does not complain of cough; therefore I will avoid anti-tussives. Flonase would be a good option for him, but he unfortunately states he cannot tolerate nasal sprays.  Plan: -Guaifenesin 200 mg Q4H  -Steamy showers and warm compresses to loosen mucus and congestion -Ocean Nasal Spray OTC if patient needs additional relief and willing to try a nasal spray

## 2014-10-09 NOTE — Assessment & Plan Note (Addendum)
During recent ED visit, patient's potassium was noted to be hypokalemic at 3.3. Patient denies any myalgias or heart palpitations. On recheck, BMET showed normal potassium level at 4.5.   -Resolved

## 2014-10-09 NOTE — Assessment & Plan Note (Signed)
Patient continues to be irregularly irregular but rate controlled on metoprolol 50 mg BID. Patient has an INR check with Cardiology after his clinic visit today. In order to save him a visit, we checked his INR which resulted as 3.4 (goal 2.0-3.0). This result was called by Ulis Rias to Cardiology for instructions for patient. Dr. Tamala Julian instructed patient to skip tomorrow's dosage of Coumadin, then start taking 1/2 tablet daily except 1 tablet on Sundays and Thursdays.   Plan: -Skip Saturday's dose of coumadin -Coumadin 2.5 mg daily on M, T, W, and Saturdays -Couamdin 5 mg daily on R and Sunday -Recheck in 2 weeks with Cardiology

## 2014-10-11 ENCOUNTER — Ambulatory Visit: Payer: Medicare Other | Admitting: Podiatry

## 2014-10-13 ENCOUNTER — Encounter: Payer: Self-pay | Admitting: *Deleted

## 2014-10-13 NOTE — Progress Notes (Signed)
Internal Medicine Clinic Attending  Case discussed with Dr. Richardson at the time of the visit.  We reviewed the resident's history and exam and pertinent patient test results.  I agree with the assessment, diagnosis, and plan of care documented in the resident's note. 

## 2014-10-22 ENCOUNTER — Ambulatory Visit (INDEPENDENT_AMBULATORY_CARE_PROVIDER_SITE_OTHER): Payer: Medicare Other

## 2014-10-22 DIAGNOSIS — Z5181 Encounter for therapeutic drug level monitoring: Secondary | ICD-10-CM

## 2014-10-22 DIAGNOSIS — I4891 Unspecified atrial fibrillation: Secondary | ICD-10-CM | POA: Diagnosis not present

## 2014-10-22 DIAGNOSIS — Z7901 Long term (current) use of anticoagulants: Secondary | ICD-10-CM

## 2014-10-22 DIAGNOSIS — I482 Chronic atrial fibrillation, unspecified: Secondary | ICD-10-CM

## 2014-10-22 LAB — POCT INR: INR: 3.6

## 2014-10-23 ENCOUNTER — Other Ambulatory Visit: Payer: Self-pay | Admitting: Internal Medicine

## 2014-10-23 DIAGNOSIS — I482 Chronic atrial fibrillation, unspecified: Secondary | ICD-10-CM

## 2014-11-08 ENCOUNTER — Ambulatory Visit: Payer: Medicare Other | Admitting: Cardiovascular Disease

## 2014-11-08 ENCOUNTER — Ambulatory Visit: Payer: Medicare Other | Admitting: Internal Medicine

## 2014-11-11 NOTE — Progress Notes (Signed)
CC: dyspnea  History of Present Illness: 59 yo male with history of diastolic CHF, CAD, paroxysmal AFib, COPD, HTN, HLD, gout, GERD who is here today for cardiac follow up. He lives here and spends time with family in [place], [place] He had been followed by Dr. [name] [facility] in [place], [place]. Echo 6/11: mild LVH, EF of 55%. mild LAE. Monitor April 2012 in [place], [place] showed NSR with episodes of sinus brady, rare PVCs, occasional PACs and brief episodes of atrial tachycardia. Per records he has been on Multaq in the past but did not like this medication so it was stopped. He had a stress myoview 09/14/05 that showed reversible ischemia in the inferior wall. This led to a cardiac cath on 10/01/05 which showed 25% mid LAD stenosis per report but no other evidence of CAD. Last Myoview 9/13: No ischemia. He was seen in the emergency room 08/23/12 with complaints of edema. BNP was mildly elevated. There was some suggestion of pulmonary venous congestion and possible pleural effusions on chest x-ray. At visit March 2014 his weight was up 7 lbs and his Lasix was increased. He was seen several times in f/u by Richardson Dopp, PA-C and not felt to be volume overloaded. TSH was normal. Admitted October 2015 with volume overload and echo showed normal systolic function EF 37%. He diuresed down to his dry weight of 304 pounds. Metoprolol was decreased to 50 mg twice a day because of nocturnal bradycardia with rates in the 30s. Digoxin was discontinued.  He is here today for follow up.  No chest pain.  He continues to have dyspnea with minimal exertion. He is out of breath even with household chores. Has some pain in left arm with exertion. Weight is stable. Still taking Lasix once daily.   Primary Care Physician:  Eppie Gibson  Past Medical History  Diagnosis Date  . Essential hypertension 09/20/2011  . Atrial fibrillation 02/04/2012  . Chronic diastolic heart failure 1/0/6269    with  mild left ventricular hypertrophy on Echo 02/2010  . Chronic pain syndrome 01/15/2013    Left arm and leg s/p traumatic injury   . Osteoarthritis cervical spine 04/25/2013  . Gastroesophageal reflux disease 04/25/2013  . Open-angle glaucoma 04/25/2013  . 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 04/25/2013  . Hyperlipidemia LDL goal < 100 04/25/2013  . Coronary artery disease 04/25/2013    25% LAD stenosis on cath 2007.  Stable angina.  . Asthma, chronic 04/25/2013    Clinical diagnosis  . Gout 04/25/2013  . Right rotator cuff tear 04/25/2013    Large full-thickness tear of the supraspinatus with mild retraction but no atrophy   . Vasomotor rhinitis 04/25/2013  . Obstructive sleep apnea 06/01/2013    Moderate, AHI 29.8 per hour with moderately loud snoring and oxygen desaturation to a nadir of 79%. CPAP titration resulted in a prescription for 17 CWP.    [name] Ulcer     History of ulcer in the remote past  . Cataract     Left eye  . Onychomycosis of toenail 12/24/2012    Left great toe   . Morbid obesity with BMI of 40.0-44.9, adult 04/25/2013  . Blood transfusion without reported diagnosis   . Chronic obstructive pulmonary disease 04/25/2013  . Diverticulosis 11/12/2013  . Osteoarthritis of left knee 06/19/2013    Tricompartmental disease.  Treated with double hinged upright knee brace, steroid/xylocaine knee injections, and NSAIDs   . CHF (congestive  heart failure)   . Shortness of breath   . Chronic renal insufficiency 07/12/2014    Past Surgical History  Procedure Laterality Date  . Left leg surgery    . Left arm surgery    . Shoulder surgery      Right  . Fracture surgery Left 1980's    Elbow    Current Outpatient Prescriptions  Medication Sig Dispense Refill  . albuterol (PROAIR HFA) 108 (90 BASE) MCG/ACT inhaler Inhale 2 puffs into the lungs every 6 (six) hours as needed for wheezing or shortness of breath. 17 each 3  . allopurinol (ZYLOPRIM) 300 MG  tablet Take 1 tablet (300 mg total) by mouth daily. 90 tablet 3  . atorvastatin (LIPITOR) 10 MG tablet Take 1 tablet (10 mg total) by mouth daily. 90 tablet 3  . chlorpheniramine-HYDROcodone (TUSSIONEX) 10-8 MG/5ML LQCR Take 5 mLs by mouth every 12 (twelve) hours as needed for cough. 473 mL 0  . esomeprazole (NEXIUM) 40 MG capsule Take 1 capsule (40 mg total) by mouth daily at 12 noon. 90 capsule 3  . fenofibrate 160 MG tablet Take 1 tablet (160 mg total) by mouth daily. 90 tablet 3  . furosemide (LASIX) 80 MG tablet Take 1 tablet (80 mg total) by mouth daily. 90 tablet 3  . guaiFENesin 200 MG tablet Take 1 tablet (200 mg total) by mouth every 4 (four) hours as needed for cough or to loosen phlegm. 30 suppository 0  . latanoprost (XALATAN) 0.005 % ophthalmic solution Place 1 drop into both eyes at bedtime. 7.5 mL 3  . meloxicam (MOBIC) 15 MG tablet Take 15 mg by mouth daily as needed. Take 15 mg tablet my mouth as needed daily  3  . metoprolol (LOPRESSOR) 50 MG tablet Take 1 tablet (50 mg total) by mouth 2 (two) times daily. 180 tablet 3  . nitroGLYCERIN (NITROSTAT) 0.4 MG SL tablet Place 0.4 mg under the tongue every 5 (five) minutes as needed for chest pain.    [name] oxyCODONE-acetaminophen (PERCOCET) 10-325 MG per tablet Take 1 tablet by mouth every 6 (six) hours as needed for pain (take one tablet by mouth every 6 hours by mouth as needed for pain). Take by mouth every 6 (six) hours as needed for pain.  0  . sulindac (CLINORIL) 200 MG tablet Take 1 tablet (200 mg total) by mouth 2 (two) times daily as needed. 180 tablet 3  . terazosin (HYTRIN) 5 MG capsule Take 1 capsule (5 mg total) by mouth at bedtime. 90 capsule 3  . valsartan (DIOVAN) 320 MG tablet Take 1 tablet (320 mg total) by mouth daily. 90 tablet 3  . warfarin (COUMADIN) 5 MG tablet Take 1/2 tablet (2.5 mg) by mouth every day except on Sundays take 1 tablet (5 mg) 30 tablet 1   No current facility-administered medications for this visit.      Allergies  Allergen Reactions  . Ramipril Swelling  . Testosterone Rash    History   Social History  . Marital Status: Widowed    Spouse Name: N/A  . Number of Children: N/A  . Years of Education: N/A   Occupational History  . Not on file.   Social History Main Topics  . Smoking status: Never Smoker   . Smokeless tobacco: Never Used  . Alcohol Use: 1.0 oz/week    2 drink(s) per week     Comment: Liquor.  . Drug Use: No  . Sexual Activity: Yes    Birth Control/  Protection: None   Other Topics Concern  . Not on file   Social History Narrative    Family History  Problem Relation Age of Onset  . Heart failure Mother   . Alzheimer's disease Father   . Healthy Sister   . Healthy Brother   . Healthy Son   . Healthy Sister   . Healthy Sister   . Healthy Sister   . Healthy Sister   . Healthy Brother   . Healthy Brother   . Healthy Brother   . Heart failure Brother   . Healthy Brother   . Osteoarthritis Brother   . Prostate cancer Brother   . Early death Brother     Gun Shot Wound    Review of Systems:  As stated in the HPI and otherwise negative.   BP 110/58 mmHg  Pulse 72  Ht 6' (1.829 m)  Wt 298 lb 12.8 oz (135.535 kg)  BMI 40.52 kg/m2  SpO2 96%  Physical Examination: General: Well developed, well nourished, NAD HEENT: OP clear, mucus membranes moist SKIN: warm, dry. No rashes. Neuro: No focal deficits Musculoskeletal: Muscle strength 5/5 all ext Psychiatric: Mood and affect normal Neck: No JVD, no carotid bruits, no thyromegaly, no lymphadenopathy. Lungs:Clear bilaterally, no wheezes, rhonci, crackles Cardiovascular: Irreg irreg rate and rhythm. No murmurs, gallops or rubs. Abdomen:Soft. Bowel sounds present. Non-tender.  Extremities: No lower extremity edema. Pulses are 2 + in the bilateral DP/PT.  Assessment and Plan:   1. Chronic Diastolic CHF: Weight is stable. He is actually 7 lbs lighter than he was 6 months ago. Swelling is the  same in lower extremities. Continue Lasix 80 mg po Qdaily. We have attempted more diuresis in the past but he develops renal failure quickly.   2. Atrial Fibrillation: He remains on Coumadin. Rate controlled on metoprolol. He is followed in our coumadin clinic. Some of his dyspnea may be related to his atrial fibrillation. Will consider cardioversion if we cannot find other reasons for dyspnea. Will arrange 48 hour monitor.   3. Hypertension: BP controlled. Continue Diovan, Lopressor, Hytrin  4. CAD: Stable. He is not on an ASA since he has been on coumadin. Continue statin, beta blocker. Will arrange stress myoview to exclude ischemia as cause of dyspnea  5. Chronic kidney disease: stable last check.   6. Dyspnea: Multifactorial. He is 150 lbs heavier than he was at age 47. His morbid obesity most certainly is contributing to his dyspnea. He is deconditioned. He has underlying lung disease with PFTs in 2014 showing severe obstructive and severe restrictive airways disease. He has atrial fibrillation. Normal LV function. See above. From a cardiac standpoint, will exclude ischemia with stress test and will plan 48 hour monitor to exclude rapid rates and bradycardia.

## 2014-11-12 ENCOUNTER — Ambulatory Visit: Payer: Medicare Other | Admitting: Internal Medicine

## 2014-11-12 ENCOUNTER — Encounter: Payer: Self-pay | Admitting: Cardiovascular Disease

## 2014-11-12 ENCOUNTER — Ambulatory Visit (INDEPENDENT_AMBULATORY_CARE_PROVIDER_SITE_OTHER): Payer: Medicare Other | Admitting: Cardiovascular Disease

## 2014-11-12 ENCOUNTER — Ambulatory Visit (INDEPENDENT_AMBULATORY_CARE_PROVIDER_SITE_OTHER): Payer: Medicare Other | Admitting: *Deleted

## 2014-11-12 VITALS — BP 110/58 | HR 72 | Ht 72.0 in | Wt 298.8 lb

## 2014-11-12 DIAGNOSIS — Z7901 Long term (current) use of anticoagulants: Secondary | ICD-10-CM

## 2014-11-12 DIAGNOSIS — I482 Chronic atrial fibrillation, unspecified: Secondary | ICD-10-CM

## 2014-11-12 DIAGNOSIS — Z6841 Body Mass Index (BMI) 40.0 and over, adult: Secondary | ICD-10-CM | POA: Diagnosis not present

## 2014-11-12 DIAGNOSIS — I4891 Unspecified atrial fibrillation: Secondary | ICD-10-CM

## 2014-11-12 DIAGNOSIS — I10 Essential (primary) hypertension: Secondary | ICD-10-CM

## 2014-11-12 DIAGNOSIS — I481 Persistent atrial fibrillation: Secondary | ICD-10-CM

## 2014-11-12 DIAGNOSIS — I5032 Chronic diastolic (congestive) heart failure: Secondary | ICD-10-CM

## 2014-11-12 DIAGNOSIS — I4819 Other persistent atrial fibrillation: Secondary | ICD-10-CM

## 2014-11-12 DIAGNOSIS — I2510 Atherosclerotic heart disease of native coronary artery without angina pectoris: Secondary | ICD-10-CM

## 2014-11-12 DIAGNOSIS — N189 Chronic kidney disease, unspecified: Secondary | ICD-10-CM | POA: Diagnosis not present

## 2014-11-12 DIAGNOSIS — Z5181 Encounter for therapeutic drug level monitoring: Secondary | ICD-10-CM

## 2014-11-12 LAB — POCT INR: INR: 2

## 2014-11-12 NOTE — Patient Instructions (Signed)
Your physician recommends that you schedule a follow-up appointment in:  3-4 weeks.  Your physician has recommended that you wear a holter monitor. Holter monitors are medical devices that record the heart's electrical activity. Doctors most often use these monitors to diagnose arrhythmias. Arrhythmias are problems with the speed or rhythm of the heartbeat. The monitor is a small, portable device. You can wear one while you do your normal daily activities. This is usually used to diagnose what is causing palpitations/syncope (passing out).  Your physician has requested that you have a lexiscan myoview. For further information please visit [email or web address]. Please follow instruction sheet, as given.

## 2014-11-15 ENCOUNTER — Encounter: Payer: Self-pay | Admitting: Podiatry

## 2014-11-15 ENCOUNTER — Ambulatory Visit (INDEPENDENT_AMBULATORY_CARE_PROVIDER_SITE_OTHER): Payer: Medicare Other | Admitting: Podiatry

## 2014-11-15 DIAGNOSIS — M79676 Pain in unspecified toe(s): Secondary | ICD-10-CM | POA: Diagnosis not present

## 2014-11-15 DIAGNOSIS — B351 Tinea unguium: Secondary | ICD-10-CM | POA: Diagnosis not present

## 2014-11-15 NOTE — Patient Instructions (Signed)
Diabetes and Foot Care Diabetes may cause you to have problems because of poor blood supply (circulation) to your feet and legs. This may cause the skin on your feet to become thinner, break easier, and heal more slowly. Your skin may become dry, and the skin may peel and crack. You may also have nerve damage in your legs and feet causing decreased feeling in them. You may not notice minor injuries to your feet that could lead to infections or more serious problems. Taking care of your feet is one of the most important things you can do for yourself.  HOME CARE INSTRUCTIONS  Wear shoes at all times, even in the house. Do not go barefoot. Bare feet are easily injured.  Check your feet daily for blisters, cuts, and redness. If you cannot see the bottom of your feet, use a mirror or ask someone for help.  Wash your feet with warm water (do not use hot water) and mild soap. Then pat your feet and the areas between your toes until they are completely dry. Do not soak your feet as this can dry your skin.  Apply a moisturizing lotion or petroleum jelly (that does not contain alcohol and is unscented) to the skin on your feet and to dry, brittle toenails. Do not apply lotion between your toes.  Trim your toenails straight across. Do not dig under them or around the cuticle. File the edges of your nails with an emery board or nail file.  Do not cut corns or calluses or try to remove them with medicine.  Wear clean socks or stockings every day. Make sure they are not too tight. Do not wear knee-high stockings since they may decrease blood flow to your legs.  Wear shoes that fit properly and have enough cushioning. To break in new shoes, wear them for just a few hours a day. This prevents you from injuring your feet. Always look in your shoes before you put them on to be sure there are no objects inside.  Do not cross your legs. This may decrease the blood flow to your feet.  If you find a minor scrape,  cut, or break in the skin on your feet, keep it and the skin around it clean and dry. These areas may be cleansed with mild soap and water. Do not cleanse the area with peroxide, alcohol, or iodine.  When you remove an adhesive bandage, be sure not to damage the skin around it.  If you have a wound, look at it several times a day to make sure it is healing.  Do not use heating pads or hot water bottles. They may burn your skin. If you have lost feeling in your feet or legs, you may not know it is happening until it is too late.  Make sure your health care provider performs a complete foot exam at least annually or more often if you have foot problems. Report any cuts, sores, or bruises to your health care provider immediately. SEEK MEDICAL CARE IF:   You have an injury that is not healing.  You have cuts or breaks in the skin.  You have an ingrown nail.  You notice redness on your legs or feet.  You feel burning or tingling in your legs or feet.  You have pain or cramps in your legs and feet.  Your legs or feet are numb.  Your feet always feel cold. SEEK IMMEDIATE MEDICAL CARE IF:   There is increasing redness,   swelling, or pain in or around a wound.  There is a red line that goes up your leg.  Pus is coming from a wound.  You develop a fever or as directed by your health care provider.  You notice a bad smell coming from an ulcer or wound. Document Released: 08/17/2000 Document Revised: 04/22/2013 Document Reviewed: 01/27/2013 Exit[employer] Patient Information 2015 [employer]. This information is not intended to replace advice given to you by your health care provider. Make sure you discuss any questions you have with your health care provider.  

## 2014-11-15 NOTE — Progress Notes (Signed)
Patient ID: Juan Calderon, male   DOB: [birth year], 59 y.o.   MRN: [id number]  Subjective: Issued presents today complaining of painful toenails and requests nail debridement  Objective: The toenails are elongated, hypertrophic, discolored and tender to palpation 6-10  Assessment: Symptomatic onychomycoses 6-10  Plan: Debridement of toenails 10 without any bleeding  Reappoint 3 months

## 2014-11-25 ENCOUNTER — Encounter: Payer: Self-pay | Admitting: Radiology

## 2014-11-25 ENCOUNTER — Ambulatory Visit (INDEPENDENT_AMBULATORY_CARE_PROVIDER_SITE_OTHER): Payer: Medicare Other | Admitting: *Deleted

## 2014-11-25 ENCOUNTER — Ambulatory Visit (HOSPITAL_COMMUNITY): Payer: Medicare Other | Attending: Cardiology | Admitting: Radiology

## 2014-11-25 ENCOUNTER — Encounter (INDEPENDENT_AMBULATORY_CARE_PROVIDER_SITE_OTHER): Payer: Medicare Other

## 2014-11-25 DIAGNOSIS — I4891 Unspecified atrial fibrillation: Secondary | ICD-10-CM

## 2014-11-25 DIAGNOSIS — I4819 Other persistent atrial fibrillation: Secondary | ICD-10-CM

## 2014-11-25 DIAGNOSIS — E119 Type 2 diabetes mellitus without complications: Secondary | ICD-10-CM | POA: Insufficient documentation

## 2014-11-25 DIAGNOSIS — R0609 Other forms of dyspnea: Secondary | ICD-10-CM | POA: Insufficient documentation

## 2014-11-25 DIAGNOSIS — I2510 Atherosclerotic heart disease of native coronary artery without angina pectoris: Secondary | ICD-10-CM | POA: Diagnosis not present

## 2014-11-25 DIAGNOSIS — R002 Palpitations: Secondary | ICD-10-CM | POA: Diagnosis not present

## 2014-11-25 DIAGNOSIS — I10 Essential (primary) hypertension: Secondary | ICD-10-CM | POA: Diagnosis not present

## 2014-11-25 DIAGNOSIS — I482 Chronic atrial fibrillation, unspecified: Secondary | ICD-10-CM

## 2014-11-25 DIAGNOSIS — Z7901 Long term (current) use of anticoagulants: Secondary | ICD-10-CM | POA: Diagnosis not present

## 2014-11-25 DIAGNOSIS — I481 Persistent atrial fibrillation: Secondary | ICD-10-CM

## 2014-11-25 DIAGNOSIS — Z5181 Encounter for therapeutic drug level monitoring: Secondary | ICD-10-CM

## 2014-11-25 DIAGNOSIS — R0602 Shortness of breath: Secondary | ICD-10-CM | POA: Diagnosis not present

## 2014-11-25 LAB — POCT INR: INR: 2.6

## 2014-11-25 MED ORDER — TECHNETIUM TC 99M SESTAMIBI GENERIC - CARDIOLITE
30.0000 | Freq: Once | INTRAVENOUS | Status: AC | PRN
  Administered 2014-11-25: 30 via INTRAVENOUS

## 2014-11-25 MED ORDER — REGADENOSON 0.4 MG/5ML IV SOLN
0.4000 mg | Freq: Once | INTRAVENOUS | Status: AC
  Administered 2014-11-25: 0.4 mg via INTRAVENOUS

## 2014-11-25 NOTE — Progress Notes (Signed)
  [facility] [address] [place], [place] [address] [phone]    Cardiology Nuclear Med Study  Juan Calderon is a 59 y.o. male     MRN : [id number]     DOB: 12-Oct-1955  Procedure Date: 11/25/2014  Nuclear Med Background Indication for Stress Test:  Evaluation for Ischemia and Follow up CAD History:  Asthma, COPD and CAD, '13 MPI: NL, AFIB,CHF Cardiac Risk Factors: Hypertension, NIDDM and Atria Fib  Symptoms:  DOE, Palpitations and SOB   Nuclear Pre-Procedure Caffeine/Decaff Intake:  10:00pm NPO After: 10:00pm   Lungs:  clear O2 Sat: 95% on room air. IV 0.9% NS with Angio Cath:  22g  IV Site: L Forearm  IV Started by:  Matilde Haymaker, RN  Chest Size (in):  56 Cup Size: n/a  Height: 6' (1.829 m)  Weight:  293 lb (132.904 kg)  BMI:  Body mass index is 39.73 kg/(m^2). Tech Comments:  Lopressor taken at 1000 today    Nuclear Med Study 1 or 2 day study: 2 day  Stress Test Type:  Lexiscan  Reading MD: n/a  Order Authorizing Provider:  Gennette Pac  Resting Radionuclide: Technetium 41m Sestamibi  Resting Radionuclide Dose: 33.0 mCi on 11/29/2014  Stress Radionuclide:  Technetium 27m Sestamibi  Stress Radionuclide Dose: 33.0 mCi on 11/25/2014          Stress Protocol Rest HR: 81 Stress HR: 86  Rest BP: 118/81 Stress BP: 127/72  Exercise Time (min): n/a METS: n/a   Predicted Max HR: 160 bpm % Max HR: 53.75 bpm Rate Pressure Product: 10922   Dose of Adenosine (mg):  n/a Dose of Lexiscan: 0.4 mg  Dose of Atropine (mg): n/a Dose of Dobutamine: n/a mcg/kg/min (at max HR)  Stress Test Technologist: Perrin Maltese, EMT-P  Nuclear Technologist:  Earl Many, CNMT     Rest Procedure:  Myocardial perfusion imaging was performed at rest 45 minutes following the intravenous administration of Technetium 80m Sestamibi. Rest ECG: Atrial fibrillation with PVCs, septal MI, nonspecific ST changes  Stress Procedure:  The patient  received IV Lexiscan 0.4 mg over 15-seconds.  Technetium 69m Sestamibi injected at 30-seconds. This patient was sob, had a weird feeling, and chest pressure with the Lexiscan injection. Quantitative spect images were obtained after a 45 minute delay. Stress ECG: No significant ST segment change suggestive of ischemia.  QPS Raw Data Images:  Acquisition technically good; LVE. Stress Images:  Normal homogeneous uptake in all areas of the myocardium. Rest Images:  Normal homogeneous uptake in all areas of the myocardium. Subtraction (SDS):  No evidence of ischemia. Transient Ischemic Dilatation (Normal <1.22):  1.15 Lung/Heart Ratio (Normal <0.45):  0.20  Quantitative Gated Spect Images QGS EDV:  NA ml QGS ESV:  NA ml  Impression Exercise Capacity:  Lexiscan with no exercise. BP Response:  Normal blood pressure response. Clinical Symptoms:  There is dyspnea and chest pressure ECG Impression:  No significant ST segment change suggestive of ischemia. Comparison with Prior Nuclear Study: Compared to 05/10/12, no change.  Overall Impression:  Normal stress nuclear study.  LV Ejection Fraction:Study not gated. .  LV Wall Motion:  Study not gated due to atrial fibrillation; there appears to be significant LVE.  Kirk Ruths

## 2014-11-25 NOTE — Progress Notes (Signed)
Patient ID: Juan Calderon, male   DOB: [birth year], 59 y.o.   MRN: [id number] E cardio 48hr holter applied.

## 2014-11-29 ENCOUNTER — Other Ambulatory Visit: Payer: Self-pay | Admitting: *Deleted

## 2014-11-29 ENCOUNTER — Ambulatory Visit (HOSPITAL_COMMUNITY): Payer: Medicare Other | Attending: Internal Medicine

## 2014-11-29 DIAGNOSIS — G894 Chronic pain syndrome: Secondary | ICD-10-CM

## 2014-11-29 DIAGNOSIS — R0989 Other specified symptoms and signs involving the circulatory and respiratory systems: Secondary | ICD-10-CM

## 2014-11-29 MED ORDER — TECHNETIUM TC 99M SESTAMIBI GENERIC - CARDIOLITE
30.0000 | Freq: Once | INTRAVENOUS | Status: AC | PRN
  Administered 2014-11-29: 30 via INTRAVENOUS

## 2014-11-30 MED ORDER — OXYCODONE-ACETAMINOPHEN 10-325 MG PO TABS: 1.0000 | ORAL_TABLET | Freq: Four times a day (QID) | ORAL | Status: DC | PRN

## 2014-12-09 ENCOUNTER — Encounter: Payer: Self-pay | Admitting: Cardiovascular Disease

## 2014-12-09 ENCOUNTER — Ambulatory Visit (INDEPENDENT_AMBULATORY_CARE_PROVIDER_SITE_OTHER): Payer: Medicare Other | Admitting: Cardiovascular Disease

## 2014-12-09 VITALS — BP 110/81 | HR 72 | Ht 72.0 in | Wt 296.0 lb

## 2014-12-09 DIAGNOSIS — I2510 Atherosclerotic heart disease of native coronary artery without angina pectoris: Secondary | ICD-10-CM

## 2014-12-09 DIAGNOSIS — I482 Chronic atrial fibrillation, unspecified: Secondary | ICD-10-CM

## 2014-12-09 DIAGNOSIS — I10 Essential (primary) hypertension: Secondary | ICD-10-CM | POA: Diagnosis not present

## 2014-12-09 DIAGNOSIS — I5032 Chronic diastolic (congestive) heart failure: Secondary | ICD-10-CM | POA: Diagnosis not present

## 2014-12-09 NOTE — Progress Notes (Signed)
Chief Complaint  Patient presents with  . Follow-up  . Shortness of Breath    History of Present Illness: 59 yo male with history of diastolic CHF, CAD, persistent AFib, COPD, HTN, HLD, gout, GERD who is here today for cardiac follow up. He lives here and spends time with family in [place], [place] He had been followed by Dr. [name] At [facility] in [place], [place]. Echo 6/11: mild LVH, EF of 55%. mild LAE. Monitor April 2012 in [place], [place] showed NSR with episodes of sinus brady, rare PVCs, occasional PACs and brief episodes of atrial tachycardia. Per records he has been on Multaq in the past but did not like this medication so it was stopped. He had a stress myoview 09/14/05 that showed reversible ischemia in the inferior wall. This led to a cardiac cath on 10/01/05 which showed 25% mid LAD stenosis per report but no other evidence of CAD.  Admitted October 2015 with volume overload and echo showed normal systolic function EF 40%. He diuresed down to his dry weight of 304 pounds. Metoprolol was decreased to 50 mg twice a day because of nocturnal bradycardia with rates in the 30s. Digoxin was discontinued. He was seen in March 2016 and had c/o dyspnea on exertion. Stress myoview 11/25/14 with no ischemia. 24 hour monitor showed atrial fib with bradycardia (rates as low as 40 bpm), frequent PVCs (9000 in 24 hours) and non-sustained VT.   He is here today for follow up.  No chest pain.  He continues to have dyspnea with minimal exertion. He is out of breath even with household chores. He is not aware of palpitations. Weight is stable. Still taking Lasix once daily.   Primary Care Physician:  Eppie Gibson  Past Medical History  Diagnosis Date  . Essential hypertension 09/20/2011  . Atrial fibrillation 02/04/2012  . Chronic diastolic heart failure 05/11/1190    with mild left ventricular hypertrophy on Echo 02/2010  . Chronic pain syndrome 01/15/2013    Left arm and leg s/p traumatic  injury   . Osteoarthritis cervical spine 04/25/2013  . Gastroesophageal reflux disease 04/25/2013  . Open-angle glaucoma 04/25/2013  . 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 04/25/2013  . Hyperlipidemia LDL goal < 100 04/25/2013  . Coronary artery disease 04/25/2013    25% LAD stenosis on cath 2007.  Stable angina.  . Asthma, chronic 04/25/2013    Clinical diagnosis  . Gout 04/25/2013  . Right rotator cuff tear 04/25/2013    Large full-thickness tear of the supraspinatus with mild retraction but no atrophy   . Vasomotor rhinitis 04/25/2013  . Obstructive sleep apnea 06/01/2013    Moderate, AHI 29.8 per hour with moderately loud snoring and oxygen desaturation to a nadir of 79%. CPAP titration resulted in a prescription for 17 CWP.    [name] Ulcer     History of ulcer in the remote past  . Cataract     Left eye  . Onychomycosis of toenail 12/24/2012    Left great toe   . Morbid obesity with BMI of 40.0-44.9, adult 04/25/2013  . Blood transfusion without reported diagnosis   . Chronic obstructive pulmonary disease 04/25/2013  . Diverticulosis 11/12/2013  . Osteoarthritis of left knee 06/19/2013    Tricompartmental disease.  Treated with double hinged upright knee brace, steroid/xylocaine knee injections, and NSAIDs   . CHF (congestive heart failure)   . Shortness of breath   . Chronic renal insufficiency 07/12/2014  Past Surgical History  Procedure Laterality Date  . Left leg surgery    . Left arm surgery    . Shoulder surgery      Right  . Fracture surgery Left 1980's    Elbow    Current Outpatient Prescriptions  Medication Sig Dispense Refill  . albuterol (PROAIR HFA) 108 (90 BASE) MCG/ACT inhaler Inhale 2 puffs into the lungs every 6 (six) hours as needed for wheezing or shortness of breath. 17 each 3  . allopurinol (ZYLOPRIM) 300 MG tablet Take 1 tablet (300 mg total) by mouth daily. 90 tablet 3  . atorvastatin (LIPITOR) 10 MG tablet Take 1 tablet (10 mg  total) by mouth daily. 90 tablet 3  . chlorpheniramine-HYDROcodone (TUSSIONEX) 10-8 MG/5ML LQCR Take 5 mLs by mouth every 12 (twelve) hours as needed for cough. 473 mL 0  . esomeprazole (NEXIUM) 40 MG capsule Take 1 capsule (40 mg total) by mouth daily at 12 noon. 90 capsule 3  . fenofibrate 160 MG tablet Take 1 tablet (160 mg total) by mouth daily. 90 tablet 3  . furosemide (LASIX) 80 MG tablet Take 1 tablet (80 mg total) by mouth daily. 90 tablet 3  . guaiFENesin 200 MG tablet Take 1 tablet (200 mg total) by mouth every 4 (four) hours as needed for cough or to loosen phlegm. 30 suppository 0  . latanoprost (XALATAN) 0.005 % ophthalmic solution Place 1 drop into both eyes at bedtime. 7.5 mL 3  . meloxicam (MOBIC) 15 MG tablet Take 15 mg by mouth daily as needed. Take 15 mg tablet my mouth as needed daily  3  . metoprolol (LOPRESSOR) 50 MG tablet Take 1 tablet (50 mg total) by mouth 2 (two) times daily. 180 tablet 3  . nitroGLYCERIN (NITROSTAT) 0.4 MG SL tablet Place 0.4 mg under the tongue every 5 (five) minutes as needed for chest pain.    [name] oxyCODONE-acetaminophen (PERCOCET) 10-325 MG per tablet Take 1 tablet by mouth every 6 (six) hours as needed for pain. 120 tablet 0  . sulindac (CLINORIL) 200 MG tablet Take 1 tablet (200 mg total) by mouth 2 (two) times daily as needed. 180 tablet 3  . terazosin (HYTRIN) 5 MG capsule Take 1 capsule (5 mg total) by mouth at bedtime. 90 capsule 3  . valsartan (DIOVAN) 320 MG tablet Take 1 tablet (320 mg total) by mouth daily. 90 tablet 3  . warfarin (COUMADIN) 5 MG tablet Take 1/2 tablet (2.5 mg) by mouth every day except on Sundays take 1 tablet (5 mg) 30 tablet 1   No current facility-administered medications for this visit.    Allergies  Allergen Reactions  . Ramipril Swelling  . Testosterone Rash    History   Social History  . Marital Status: Widowed    Spouse Name: N/A  . Number of Children: N/A  . Years of Education: N/A   Occupational  History  . Not on file.   Social History Main Topics  . Smoking status: Never Smoker   . Smokeless tobacco: Never Used  . Alcohol Use: 1.0 oz/week    2 drink(s) per week     Comment: Liquor.  . Drug Use: No  . Sexual Activity: Yes    Birth Control/ Protection: None   Other Topics Concern  . Not on file   Social History Narrative    Family History  Problem Relation Age of Onset  . Heart failure Mother   . Alzheimer's disease Father   . Healthy  Sister   . Healthy Brother   . Healthy Son   . Healthy Sister   . Healthy Sister   . Healthy Sister   . Healthy Sister   . Healthy Brother   . Healthy Brother   . Healthy Brother   . Heart failure Brother   . Healthy Brother   . Osteoarthritis Brother   . Prostate cancer Brother   . Early death Brother     Manufacturing systems engineer  . Heart attack Neg Hx   . Stroke Neg Hx   . Hypertension Sister   . Hypertension Brother     Review of Systems:  As stated in the HPI and otherwise negative.   BP 110/81 mmHg  Pulse 72  Ht 6' (1.829 m)  Wt 296 lb (134.265 kg)  BMI 40.14 kg/m2  Physical Examination: General: Well developed, well nourished, NAD HEENT: OP clear, mucus membranes moist SKIN: warm, dry. No rashes. Neuro: No focal deficits Musculoskeletal: Muscle strength 5/5 all ext Psychiatric: Mood and affect normal Neck: No JVD, no carotid bruits, no thyromegaly, no lymphadenopathy. Lungs:Clear bilaterally, no wheezes, rhonci, crackles Cardiovascular: Irreg irreg rate and rhythm. No murmurs, gallops or rubs. Abdomen:Soft. Bowel sounds present. Non-tender.  Extremities: No lower extremity edema. Pulses are 2 + in the bilateral DP/PT.  Stress myoview 11/25/14: Stress Procedure: The patient received IV Lexiscan 0.4 mg over 15-seconds. Technetium 45m Sestamibi injected at 30-seconds. This patient was sob, had a weird feeling, and chest pressure with the Lexiscan injection. Quantitative spect images were obtained after a 45 minute  delay. Stress ECG: No significant ST segment change suggestive of ischemia.  QPS Raw Data Images: Acquisition technically good; LVE. Stress Images: Normal homogeneous uptake in all areas of the myocardium. Rest Images: Normal homogeneous uptake in all areas of the myocardium. Subtraction (SDS): No evidence of ischemia. Transient Ischemic Dilatation (Normal <1.22): 1.15 Lung/Heart Ratio (Normal <0.45): 0.20  Quantitative Gated Spect Images QGS EDV: NA ml QGS ESV: NA ml  Impression Exercise Capacity: Lexiscan with no exercise. BP Response: Normal blood pressure response. Clinical Symptoms: There is dyspnea and chest pressure ECG Impression: No significant ST segment change suggestive of ischemia. Comparison with Prior Nuclear Study: Compared to 05/10/12, no change.  Overall Impression: Normal stress nuclear study.  LV Ejection Fraction:Study not gated. . LV Wall Motion: Study not gated due to atrial fibrillation; there appears to be significant LVE.  EKG:  EKG is ordered today. The ekg ordered today demonstrates Atrial fib, rate 72 bpm. QTc 468 msec. Non-specific T wave abnormality.   Recent Labs: 06/18/2014: Pro B Natriuretic peptide (BNP) 1148.0*; TSH 2.000 09/26/2014: B Natriuretic Peptide 119.2*; Hemoglobin 12.2*; Platelets 225 10/08/2014: BUN 19; Creatinine 1.24; Potassium 4.5; Sodium 140   Lipid Panel    Component Value Date/Time   CHOL 108 08/13/2014 1046   TRIG 93 08/13/2014 1046   HDL 55 08/13/2014 1046   CHOLHDL 2.0 08/13/2014 1046   VLDL 19 08/13/2014 1046   LDLCALC 34 08/13/2014 1046     Wt Readings from Last 3 Encounters:  12/09/14 296 lb (134.265 kg)  11/25/14 293 lb (132.904 kg)  11/12/14 298 lb 12.8 oz (135.535 kg)     Other studies Reviewed: Additional studies/ records that were reviewed today include: Holter monitor and stress myoview Review of the above records demonstrates:    Assessment and Plan:   1. Chronic Diastolic CHF: Weight  is stable. He is actually 7 lbs lighter than he was 6 months ago. I have  looked back over the years and he is 17 lbs heavier than he was in April 2014 but over the last 2 years he has only fluctuated a few pounds within his current weight. Swelling is the same in lower extremities. Continue Lasix 80 mg po Qdaily. We have attempted more diuresis in the past but he develops renal failure quickly.   2. Atrial Fibrillation: He remains on Coumadin. Rate controlled on metoprolol. He is followed in our coumadin clinic. I suspect that some of his dyspnea and fatigue is related to his persistent atrial fibrillation and worsened by bradycardia and PVCs. 24 hour monitor showed atrial fib with bradycardia (rates as low as 40 bpm), frequent PVCs (9000 in 24 hours) and non-sustained VT. I cannot titrate his beta blocker due to his bradycardia. Will refer to EP for tachy/brady and further recs. ? Would attempt at cardioversion be worthwhile and if so, what medication should be loaded before DCCV.    3. Hypertension: BP controlled. Continue current meds.   4. CAD: Stable. He is not on an ASA since he has been on coumadin. Continue statin, beta blocker. Recent stress myoview without ischemia.   5. Chronic kidney disease: stable last check.   6. Dyspnea: Multifactorial. He is 150 lbs heavier than he was at age 41. His morbid obesity most certainly is contributing to his dyspnea. He is deconditioned. He has underlying lung disease with PFTs in 2014 showing severe obstructive and severe restrictive airways disease. He has atrial fibrillation. Normal LV function. No ischemia on stress test but he does have several EP issues (bradycardia, PVCS, atrial fib) which may be contributing.   Current medicines are reviewed at length with the patient today.  The patient does not have concerns regarding medicines.  The following changes have been made:  no change  Labs/ tests ordered today include:  No orders of the defined types  were placed in this encounter.    Disposition:   FU with me in 6 months   EP referral now  Signed, Lauree Chandler, MD 12/09/2014 11:07 AM    [facility] [place], [place], [place]  [address] Phone: [phone]; Fax: [phone]

## 2014-12-09 NOTE — Patient Instructions (Signed)
You have been referred to Dr. Lovena Le, Dr [name] or Dr Rayann Heman.  Please schedule patient for new patient appt.  Your physician wants you to follow-up in: 6 months with Dr. Angelena Form. You will receive a reminder letter in the mail two months in advance. If you don't receive a letter, please call our office to schedule the follow-up appointment.

## 2014-12-23 ENCOUNTER — Encounter: Payer: Self-pay | Admitting: Internal Medicine

## 2014-12-23 ENCOUNTER — Ambulatory Visit (INDEPENDENT_AMBULATORY_CARE_PROVIDER_SITE_OTHER): Payer: Medicare Other | Admitting: *Deleted

## 2014-12-23 ENCOUNTER — Ambulatory Visit (INDEPENDENT_AMBULATORY_CARE_PROVIDER_SITE_OTHER): Payer: Medicare Other | Admitting: Internal Medicine

## 2014-12-23 VITALS — BP 130/90 | HR 86 | Ht 71.0 in | Wt 302.4 lb

## 2014-12-23 DIAGNOSIS — I2510 Atherosclerotic heart disease of native coronary artery without angina pectoris: Secondary | ICD-10-CM | POA: Diagnosis not present

## 2014-12-23 DIAGNOSIS — I482 Chronic atrial fibrillation, unspecified: Secondary | ICD-10-CM

## 2014-12-23 DIAGNOSIS — Z5181 Encounter for therapeutic drug level monitoring: Secondary | ICD-10-CM

## 2014-12-23 DIAGNOSIS — I4891 Unspecified atrial fibrillation: Secondary | ICD-10-CM | POA: Diagnosis not present

## 2014-12-23 DIAGNOSIS — Z7901 Long term (current) use of anticoagulants: Secondary | ICD-10-CM

## 2014-12-23 DIAGNOSIS — Z01812 Encounter for preprocedural laboratory examination: Secondary | ICD-10-CM

## 2014-12-23 LAB — BASIC METABOLIC PANEL
BUN: 17 mg/dL (ref 6–23)
CALCIUM: 9.7 mg/dL (ref 8.4–10.5)
CO2: 34 meq/L — AB (ref 19–32)
Chloride: 100 mEq/L (ref 96–112)
Creatinine, Ser: 1.22 mg/dL (ref 0.40–1.50)
GFR: 77.81 mL/min (ref >60.00)
Glucose, Bld: 141 mg/dL — ABNORMAL HIGH (ref 70–99)
Potassium: 4 mEq/L (ref 3.5–5.1)
SODIUM: 140 meq/L (ref 135–145)

## 2014-12-23 LAB — CBC WITH DIFFERENTIAL/PLATELET
BASOS ABS: 0 10*3/uL (ref 0.0–0.1)
Basophils Relative: 0.2 % (ref 0.0–3.0)
EOS ABS: 0.1 10*3/uL (ref 0.0–0.7)
Eosinophils Relative: 1 % (ref 0.0–5.0)
HEMATOCRIT: 39.6 % (ref 39.0–52.0)
HEMOGLOBIN: 13.1 g/dL (ref 13.0–17.0)
LYMPHS ABS: 1.1 10*3/uL (ref 0.7–4.0)
Lymphocytes Relative: 15.7 % (ref 12.0–46.0)
MCHC: 33 g/dL (ref 30.0–36.0)
MCV: 90.5 fl (ref 78.0–100.0)
MONO ABS: 0.4 10*3/uL (ref 0.1–1.0)
Monocytes Relative: 6.1 % (ref 3.0–12.0)
NEUTROS ABS: 5.5 10*3/uL (ref 1.4–7.7)
Neutrophils Relative %: 77 % (ref 43.0–77.0)
Platelets: 222 10*3/uL (ref 150.0–400.0)
RBC: 4.37 Mil/uL (ref 4.22–5.81)
RDW: 17.1 % — AB (ref 11.5–15.5)
WBC: 7.1 10*3/uL (ref 4.0–10.5)

## 2014-12-23 LAB — PROTIME-INR
INR: 2.5 ratio — AB (ref 0.8–1.0)
Prothrombin Time: 26.6 s — ABNORMAL HIGH (ref 9.6–13.1)

## 2014-12-23 LAB — POCT INR: INR: 2.4

## 2014-12-23 MED ORDER — TORSEMIDE 20 MG PO TABS: 40.0000 mg | ORAL_TABLET | Freq: Every day | ORAL | Status: DC

## 2014-12-23 MED ORDER — FLECAINIDE ACETATE 150 MG PO TABS: 150.0000 mg | ORAL_TABLET | Freq: Two times a day (BID) | ORAL | Status: DC

## 2014-12-23 NOTE — Patient Instructions (Signed)
Medication Instructions:  Your physician has recommended you make the following change in your medication:  1) STOP Furosemide 2) START Torsemide 40 mg daily 3) START Flecainide 150 mg twice daily   Labwork: Pre procedure labs today: BMET, CBCD, INR  Testing/Procedures: Your physician has requested that you have an echocardiogram -- after your cardioversion (Cardioversion scheduled for 4/28). Echocardiography is a painless test that uses sound waves to create images of your heart. It provides your doctor with information about the size and shape of your heart and how well your heart's chambers and valves are working. This procedure takes approximately one hour. There are no restrictions for this procedure.   Follow-Up: Your physician recommends that you schedule a follow-up appointment in: 3 weeks with Roderic Palau, NP in the AFib clinic   Any Other Special Instructions Will Be Listed Below (If Applicable).  Electrical Cardioversion Electrical cardioversion is the delivery of a jolt of electricity to change the rhythm of the heart. Sticky patches or metal paddles are placed on the chest to deliver the electricity from a device. This is done to restore a normal rhythm. A rhythm that is too fast or not regular keeps the heart from pumping well. Electrical cardioversion is done in an emergency if:   There is low or no blood pressure as a result of the heart rhythm.   Normal rhythm must be restored as fast as possible to protect the brain and heart from further damage.   It may save a life. Cardioversion may be done for heart rhythms that are not immediately life threatening, such as atrial fibrillation or flutter, in which:   The heart is beating too fast or is not regular.   Medicine to change the rhythm has not worked.   It is safe to wait in order to allow time for preparation.  Symptoms of the abnormal rhythm are bothersome.  The risk of stroke and other serious problems  can be reduced. LET [facility] CARE PROVIDER KNOW ABOUT:   Any allergies you have.  All medicines you are taking, including vitamins, herbs, eye drops, creams, and over-the-counter medicines.  Previous problems you or members of your family have had with the use of anesthetics.   Any blood disorders you have.   Previous surgeries you have had.   Medical conditions you have. RISKS AND COMPLICATIONS  Generally, this is a safe procedure. However, problems can occur and include:   Breathing problems related to the anesthetic used.  A blood clot that breaks free and travels to other parts of your body. This could cause a stroke or other problems. The risk of this is lowered by use of blood-thinning medicine (anticoagulant) prior to the procedure.  Cardiac arrest (rare). BEFORE THE PROCEDURE   You may have tests to detect blood clots in your heart and to evaluate heart function.  You may start taking anticoagulants so your blood does not clot as easily.   Medicines may be given to help stabilize your heart rate and rhythm. PROCEDURE  You will be given medicine through an IV tube to reduce discomfort and make you sleepy (sedative).   An electrical shock will be delivered. AFTER THE PROCEDURE Your heart rhythm will be watched to make sure it does not change.  Document Released: 08/10/2002 Document Revised: 01/04/2014 Document Reviewed: 03/04/2013 [facility] Patient Information 2015 [place], [place]. This information is not intended to replace advice given to you by your health care provider. Make sure you discuss any questions you  have with your health care provider.  

## 2014-12-23 NOTE — Progress Notes (Signed)
ELECTROPHYSIOLOGY CONSULT NOTE  Patient ID: Juan Calderon, MRN: [id number], DOB/AGE: 09-12-1955 59 y.o. Admit date: (Not on file) Date of Consult: 12/23/2014  Primary Physician: Karren Cobble, MD Primary Cardiologist: CMA[facility]   Chief Complaint Atrial fibrillation and CHF   HPI Juan Calderon is a 59 y.o. male  Referred for atrial fibrillation-persistent in the context of HFpEF CAD  He has significant dyspnea with mild exertion <25 ft.  He has no palpitations.  There has not been significant volume overload, this having been treated with lasix Also known to have severe COPD with obstruction noted on PFTs 2014 and morbid obesity contributing to restrictive effects  Holter monitor >>9000PVCs / DAY Nocturnal bradycardia challenged rate control  Myoview 3/16 >>no ischemia Echo 10/15  EF 55%  Past Medical History  Diagnosis Date  . Essential hypertension 09/20/2011  . Atrial fibrillation 02/04/2012  . Chronic diastolic heart failure 10/06/6331    with mild left ventricular hypertrophy on Echo 02/2010  . Chronic pain syndrome 01/15/2013    Left arm and leg s/p traumatic injury   . Osteoarthritis cervical spine 04/25/2013  . Gastroesophageal reflux disease 04/25/2013  . Open-angle glaucoma 04/25/2013  . 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 04/25/2013  . Hyperlipidemia LDL goal < 100 04/25/2013  . Coronary artery disease 04/25/2013    25% LAD stenosis on cath 2007.  Stable angina.  . Asthma, chronic 04/25/2013    Clinical diagnosis  . Gout 04/25/2013  . Right rotator cuff tear 04/25/2013    Large full-thickness tear of the supraspinatus with mild retraction but no atrophy   . Vasomotor rhinitis 04/25/2013  . Obstructive sleep apnea 06/01/2013    Moderate, AHI 29.8 per hour with moderately loud snoring and oxygen desaturation to a nadir of 79%. CPAP titration resulted in a prescription for 17 CWP.    [name] Ulcer     History of ulcer in the remote past  .  Cataract     Left eye  . Onychomycosis of toenail 12/24/2012    Left great toe   . Morbid obesity with BMI of 40.0-44.9, adult 04/25/2013  . Blood transfusion without reported diagnosis   . Chronic obstructive pulmonary disease 04/25/2013  . Diverticulosis 11/12/2013  . Osteoarthritis of left knee 06/19/2013    Tricompartmental disease.  Treated with double hinged upright knee brace, steroid/xylocaine knee injections, and NSAIDs   . CHF (congestive heart failure)   . Shortness of breath   . Chronic renal insufficiency 07/12/2014      Surgical History:  Past Surgical History  Procedure Laterality Date  . Left leg surgery    . Left arm surgery    . Shoulder surgery      Right  . Fracture surgery Left 1980's    Elbow     Home Meds: Prior to Admission medications   Medication Sig Start Date End Date Taking? Authorizing Provider  albuterol (PROAIR HFA) 108 (90 BASE) MCG/ACT inhaler Inhale 2 puffs into the lungs every 6 (six) hours as needed for wheezing or shortness of breath.   Yes Oval Linsey, MD  allopurinol (ZYLOPRIM) 300 MG tablet Take 1 tablet (300 mg total) by mouth daily. 02/11/14  Yes Oval Linsey, MD  atorvastatin (LIPITOR) 10 MG tablet Take 1 tablet (10 mg total) by mouth daily. 05/20/14  Yes Oval Linsey, MD  esomeprazole (NEXIUM) 40 MG capsule Take 1 capsule (40 mg total) by mouth daily at 12 noon. 02/11/14  Yes Oval Linsey,  MD  fenofibrate 160 MG tablet Take 1 tablet (160 mg total) by mouth daily. 02/11/14  Yes Oval Linsey, MD  furosemide (LASIX) 80 MG tablet Take 1 tablet (80 mg total) by mouth daily. 05/13/14  Yes Oval Linsey, MD  latanoprost (XALATAN) 0.005 % ophthalmic solution Place 1 drop into both eyes at bedtime. 02/11/14  Yes Oval Linsey, MD  meloxicam (MOBIC) 15 MG tablet Take 15 mg by mouth daily as needed. Take 15 mg tablet my mouth as needed daily 08/10/14  Yes Historical Provider, MD  metoprolol (LOPRESSOR) 50 MG tablet Take 1 tablet (50 mg total) by  mouth 2 (two) times daily. 06/20/14  Yes Brittainy Erie Noe, PA-C  nitroGLYCERIN (NITROSTAT) 0.4 MG SL tablet Place 0.4 mg under the tongue every 5 (five) minutes as needed for chest pain.   Yes Historical Provider, MD  oxyCODONE-acetaminophen (PERCOCET) 10-325 MG per tablet Take 1 tablet by mouth every 6 (six) hours as needed for pain. 11/30/14  Yes Oval Linsey, MD  sulindac (CLINORIL) 200 MG tablet Take 1 tablet (200 mg total) by mouth 2 (two) times daily as needed. Patient taking differently: Take 200 mg by mouth 2 (two) times daily as needed (for pain, per pt).  08/13/14  Yes Oval Linsey, MD  terazosin (HYTRIN) 5 MG capsule Take 1 capsule (5 mg total) by mouth at bedtime. 07/07/14  Yes Oval Linsey, MD  valsartan (DIOVAN) 320 MG tablet Take 1 tablet (320 mg total) by mouth daily. 05/13/14  Yes Oval Linsey, MD  warfarin (COUMADIN) 5 MG tablet Take 1/2 tablet (2.5 mg) by mouth every day except on Sundays take 1 tablet (5 mg) 10/25/14  Yes Oval Linsey, MD      Allergies:  Allergies  Allergen Reactions  . Ramipril Swelling  . Testosterone Rash    History   Social History  . Marital Status: Widowed    Spouse Name: N/A  . Number of Children: N/A  . Years of Education: N/A   Occupational History  . Not on file.   Social History Main Topics  . Smoking status: Never Smoker   . Smokeless tobacco: Never Used  . Alcohol Use: 1.0 oz/week    2 drink(s) per week     Comment: Liquor.  . Drug Use: No  . Sexual Activity: Yes    Birth Control/ Protection: None   Other Topics Concern  . Not on file   Social History Narrative     Family History  Problem Relation Age of Onset  . Heart failure Mother   . Alzheimer's disease Father   . Healthy Sister   . Healthy Brother   . Healthy Son   . Healthy Sister   . Healthy Sister   . Healthy Sister   . Healthy Sister   . Healthy Brother   . Healthy Brother   . Healthy Brother   . Heart failure Brother   . Healthy Brother     . Osteoarthritis Brother   . Prostate cancer Brother   . Early death Brother     Manufacturing systems engineer  . Heart attack Neg Hx   . Stroke Neg Hx   . Hypertension Sister   . Hypertension Brother      ROS:  Please see the history of present illness.     All other systems reviewed and negative.    Physical Exam   Blood pressure 130/90, pulse 86, height 5\' 11"  (1.803 m), weight 302 lb 6.4 oz (137.168 kg). General: Well  developed, Morbidly obese  male in no acute distress. Head: Normocephalic, atraumatic, sclera non-icteric, no xanthomas, nares are without discharge. EENT: normal Lymph Nodes:  none Back: without scoliosis/kyphosis , no CVA tendersness Neck: Negative for carotid bruits. JVD8-10 Lungs: Clear bilaterally to auscultation without wheezes, rales, or rhonchi. Breathing is unlabored. Heart: irregularly irregular rate and rhythm with 2/6  murmur , rubs, or gallops appreciated. Abdomen: Soft, non-tender, non-distended with normoactive bowel sounds. No hepatomegaly. No rebound/guarding. No obvious abdominal masses. Msk:  Strength and tone appear normal for age. Extremities: No clubbing or cyanosis.1+* edema.  Distal pedal pulses are 2+ and equal bilaterally. Skin: Warm and Dry Neuro: Alert and oriented X 3. CN III-XII intact Grossly normal sensory and motor function . Psych:  Responds to questions appropriately with a normal affect.      Labs: Cardiac Enzymes No results for input(s): CKTOTAL, CKMB, TROPONINI in the last 72 hours. CBC Lab Results  Component Value Date   WBC 5.5 09/26/2014   HGB 12.2* 09/26/2014   HCT 37.4* 09/26/2014   MCV 90.6 09/26/2014   PLT 225 09/26/2014   PROTIME:  Recent Labs  12/23/14 0936  INR 2.4   Chemistry No results for input(s): NA, K, CL, CO2, BUN, CREATININE, CALCIUM, PROT, BILITOT, ALKPHOS, ALT, AST, GLUCOSE in the last 168 hours.  Invalid input(s): LABALBU Lipids Lab Results  Component Value Date   CHOL 108 08/13/2014   HDL 55  08/13/2014   LDLCALC 34 08/13/2014   TRIG 93 08/13/2014   BNP PRO B NATRIURETIC PEPTIDE (BNP)  Date/Time Value Ref Range Status  06/18/2014 05:34 AM 1148.0* 0 - 125 pg/mL Final  12/09/2012 09:58 AM 102.0* 0.0 - 100.0 pg/mL Final  11/27/2012 01:01 PM 121.0* 0.0 - 100.0 pg/mL Final  11/20/2012 03:12 PM 123.0* 0.0 - 100.0 pg/mL Final   Thyroid Function Tests: No results for input(s): TSH, T4TOTAL, T3FREE, THYROIDAB in the last 72 hours.  Invalid input(s): FREET3    Miscellaneous No results found for: DDIMER  Radiology/Studies:  No results found.  EKG:  Atrial fibrillation 86 neck site intervals-/10/41 (49) Axis XVI PVCs   Assessment and Plan:  Atrial fibrillation-persistent  Tachycardia-bradycardia syndrome  HFpEF  Morbid obesity  Obstructive sleep apnea  PVCs  The patient has significant functional limitations and atrial fibrillation with very rapid rates;  his exertional rates exceeding 150 on a regular basis and has Daytime heart rates over 110-120 bpm for some hours. He also has nocturnal bradycardia in the context of his sleep apnea. We discussed extensively the physiology of diastolic heart failure in the context of atrial fibrillation related to issues of rate and loss of atrial contractility. I think our best bet is to try to restore sinus rhythm. With his bradycardia, drug options are limited. His QTC includes the use of dofetilide. Interestingly, his left ventricular wall thicknesses are only about 11 mm so we will start with flecainide at 150 mg twice daily. I am not SANGUINE about its utility here but it may obviate the need for backup bradycardia pacing.  He is potentially also a candidate for ablation of his atrial fibrillation; his left atrial dimensions are enlarged but not terrible. I should note that there were last assessed 6 months ago and so we will reassess them by repeat echo following efforts to restore sinus rhythm.  Given his degree of right  heart failure and pulmonary hypertension, we will change his furosemide to torsemide.  His PVC burden may  Further be contributing  We  have also had an extensive conversation regarding his obstructive sleep apnea which is untreated. While he felt no significant improvement and it significantly impaired his sleep quality, the data regarding the affect on atrial fibrillation recurrence following either cardioversion or ablation are striking and I think it is important that he try again to accomplish therapy for his sleep apnea. He is to see his PCP tomorrow.  Based on the above we will then 1-begin flecainide 2-undertake cardioversion as his INRs have been therapeutic over the last months or so 3-anticipate repeat echo following cardioversion 4-anticipate follow-up with Roderic Palau in the atrial fibrillation clinic a couple of weeks following cardioversion for functional reassessment     [name]

## 2014-12-24 ENCOUNTER — Encounter: Payer: Self-pay | Admitting: Internal Medicine

## 2014-12-24 ENCOUNTER — Ambulatory Visit (INDEPENDENT_AMBULATORY_CARE_PROVIDER_SITE_OTHER): Payer: Medicare Other | Admitting: Internal Medicine

## 2014-12-24 VITALS — BP 130/90 | HR 88 | Temp 97.7°F | Wt 302.6 lb

## 2014-12-24 DIAGNOSIS — Z6841 Body Mass Index (BMI) 40.0 and over, adult: Secondary | ICD-10-CM | POA: Diagnosis not present

## 2014-12-24 DIAGNOSIS — T402X5A Adverse effect of other opioids, initial encounter: Secondary | ICD-10-CM

## 2014-12-24 DIAGNOSIS — E1149 Type 2 diabetes mellitus with other diabetic neurological complication: Secondary | ICD-10-CM | POA: Diagnosis not present

## 2014-12-24 DIAGNOSIS — N521 Erectile dysfunction due to diseases classified elsewhere: Secondary | ICD-10-CM | POA: Diagnosis not present

## 2014-12-24 DIAGNOSIS — I2510 Atherosclerotic heart disease of native coronary artery without angina pectoris: Secondary | ICD-10-CM

## 2014-12-24 DIAGNOSIS — I482 Chronic atrial fibrillation, unspecified: Secondary | ICD-10-CM

## 2014-12-24 DIAGNOSIS — I5032 Chronic diastolic (congestive) heart failure: Secondary | ICD-10-CM

## 2014-12-24 DIAGNOSIS — E785 Hyperlipidemia, unspecified: Secondary | ICD-10-CM | POA: Diagnosis not present

## 2014-12-24 DIAGNOSIS — K5909 Other constipation: Secondary | ICD-10-CM

## 2014-12-24 DIAGNOSIS — G894 Chronic pain syndrome: Secondary | ICD-10-CM

## 2014-12-24 DIAGNOSIS — I25119 Atherosclerotic heart disease of native coronary artery with unspecified angina pectoris: Secondary | ICD-10-CM | POA: Diagnosis not present

## 2014-12-24 DIAGNOSIS — I10 Essential (primary) hypertension: Secondary | ICD-10-CM

## 2014-12-24 DIAGNOSIS — E1140 Type 2 diabetes mellitus with diabetic neuropathy, unspecified: Secondary | ICD-10-CM

## 2014-12-24 DIAGNOSIS — K5903 Drug induced constipation: Secondary | ICD-10-CM | POA: Insufficient documentation

## 2014-12-24 LAB — POCT GLYCOSYLATED HEMOGLOBIN (HGB A1C): Hemoglobin A1C: 7.2

## 2014-12-24 LAB — GLUCOSE, CAPILLARY: Glucose-Capillary: 147 mg/dL — ABNORMAL HIGH (ref 70–99)

## 2014-12-24 MED ORDER — OXYCODONE-ACETAMINOPHEN 10-325 MG PO TABS: 1.0000 | ORAL_TABLET | Freq: Four times a day (QID) | ORAL | Status: DC | PRN

## 2014-12-24 MED ORDER — SORBITOL 70 % PO SOLN: 15.0000 mL | Freq: Every day | ORAL | Status: DC | PRN

## 2014-12-24 NOTE — Progress Notes (Signed)
   Subjective:    Patient ID: Juan Calderon, male    DOB: 11/21/1955, 59 y.o.   MRN: [id number]  HPI  Juan Calderon is here for follow-up of his diabetes control, reassessment of his dyspnea, and chronic opiate induced constipation. Please see the A&P for the status of the pt's chronic medical problems.  Review of Systems  Constitutional: Negative for activity change, appetite change and unexpected weight change.  Respiratory: Positive for shortness of breath. Negative for cough, chest tightness and wheezing.   Cardiovascular: Positive for leg swelling. Negative for chest pain and palpitations.       LLE related to his previous LLE trauma  Gastrointestinal: Positive for nausea, abdominal pain, constipation and abdominal distention. Negative for vomiting and diarrhea.  Musculoskeletal: Positive for arthralgias, neck pain and neck stiffness. Negative for myalgias, back pain, joint swelling and gait problem.  Skin: Negative for color change, rash and wound.      Objective:   Physical Exam  Constitutional: He is oriented to person, place, and time. He appears well-developed and well-nourished. No distress.  HENT:  Head: Normocephalic and atraumatic.  Eyes: Conjunctivae are normal. Right eye exhibits no discharge. Left eye exhibits no discharge. No scleral icterus.  Cardiovascular:  Irregularly irregular with a 2/6 systolic murmur.  Pulmonary/Chest: Effort normal and breath sounds normal. No respiratory distress. He has no wheezes. He has no rales.  Abdominal: Soft. Bowel sounds are normal. He exhibits no distension. There is no tenderness. There is no rebound and no guarding.  Musculoskeletal: Normal range of motion. He exhibits edema. He exhibits no tenderness.  Left lower extremity edema.  Neurological: He is alert and oriented to person, place, and time. He exhibits normal muscle tone.  Skin: Skin is warm and dry. No rash noted. He is not diaphoretic. No erythema.  Psychiatric: He  has a normal mood and affect. His behavior is normal. Judgment and thought content normal.  Nursing note and vitals reviewed.     Assessment & Plan:   Please see problem oriented charting.

## 2014-12-24 NOTE — Assessment & Plan Note (Signed)
His blood pressure today is at target 130/90. This is on metoprolol 50 mg by mouth twice daily, Terazosin 5 mg at night, and valsartan 320 mg by mouth daily. We will continue these antihypertensives at those doses and reassess his blood pressure control at the follow-up visit.

## 2014-12-24 NOTE — Assessment & Plan Note (Signed)
His BMI remains above 40 today. We discussed how the morbid obesity may be contributing to his dyspnea and talked about cutting down on sweets in order to improve both his diabetic control and his weight. We will reassess the success of these dietary modifications at the follow-up visit.

## 2014-12-24 NOTE — Assessment & Plan Note (Signed)
He presents today with some abdominal bloating and associated pain which he attributes to his constipation. This improves after defecation. He has been taking a laxative but has been unsuccessful in managing the symptoms. We therefore will start sorbitol 70% 1-5 tablespoons in water, juice, or coffee each morning titrated to the desired frequency of bowel movements. We will reassess the success of the sorbitol in managing both his chronic constipation secondary to opioids and his sensation of bloating with abdominal discomfort at the follow-up visit.

## 2014-12-24 NOTE — Assessment & Plan Note (Signed)
This may be the cause of his dyspnea. Cardiology placed him on torsemide 40 mg each morning. We will continue to aggressively address his hypertension. The plan is also for cardioversion to see if normal sinus rhythm can improve his cardiac function and thus his dyspnea. This will be reassessed at the follow-up visit.

## 2014-12-24 NOTE — Assessment & Plan Note (Signed)
A recent pharmacologic stress test failed to demonstrate any reversible ischemia. We will continue to manage his hypertension and diabetes as noted above, as well as continue his high intensity statin.

## 2014-12-24 NOTE — Assessment & Plan Note (Signed)
His chronic posttraumatic pain is well-controlled on Percocet 10 mg-325 mg 1 tablet every 6 hours as needed for pain dispense #120 per month. The sulindac was ineffective and this was discontinued given his chronic diastolic dysfunction and stage III chronic kidney disease. We will therefore continue to manage his chronic pain with the Percocet. This will be reassessed at the follow-up visit.

## 2014-12-24 NOTE — Assessment & Plan Note (Signed)
He is tolerating the atorvastatin 10 mg by mouth daily without myalgias. We will continue this high intensity statin given his underlying diabetes. Per recent guidelines serial lipid panels are no longer indicated and therefore will not be checked.

## 2014-12-24 NOTE — Assessment & Plan Note (Signed)
His rate in clinic is not bradycardic on the lower dose of metoprolol 50 mg by mouth twice daily. Therefore this dose will be continued for rate control. Yesterday he was placed on flecanide 150 mg by mouth twice daily in anticipation of a cardioversion for his atrial fibrillation. He just picked up this medication and has not started it as of yet. He has been scheduled for a cardioversion per his report within the next several days. I assume this is an attempt to try to induce normal sinus rhythm which may improve his dyspnea. He remains on anticoagulation with warfarin therapy and is managed in an anticoagulation clinic. We will reassess the success of the cardioversion and maintenance in normal sinus rhythm on the flecanide at the follow-up visit.

## 2014-12-24 NOTE — Assessment & Plan Note (Signed)
His diabetic control has slightly deteriorated since the last clinic visit and his hemoglobin A1c is now 7.2. This is above the target of 7.0. When asked about this he states he's been eating a lot of sweets lately. He was not interested in starting pharmacologic therapy for the diabetes at this time but would rather try to control the amount of sweets and other dietary indiscretions he has recently had. I think it is very reasonable to try dietary control to see if we can lower the hemoglobin A1c back to less than 7.0 prior to initiating therapy. We will reassess the success of his dietary control at the follow-up visit with a hemoglobin A1c in approximately 3 months. If it remains above 7.0 I will re-offer metformin 500 mg by mouth daily if it is close to 7.0. He is otherwise up-to-date on his diabetic health care maintenance.  In terms of the erectile dysfunction, he's been unable to afford any the pharmacologic therapy at this time. We will continue to follow the price of the Viagra now that it has recently gone off patent to see if generic Viagra becomes available and is affordable.

## 2014-12-24 NOTE — Assessment & Plan Note (Signed)
He is unable to afford the Zostavax at this time. As no other blood is being drawn we will defer the HIV screening to the follow-up visit. Also at the follow-up visit we will obtain a random urine drug screen given his chronic opioid therapy. Otherwise he is up-to-date on his health care maintenance.

## 2014-12-24 NOTE — Patient Instructions (Signed)
It was great to see you today.  1) I am hopeful that they will be able to keep your heart in normal rhythm and get some fluid off of your lungs.  This should help your breathing.  2) We stopped the sulindac and the albuterol today as they were not working for you and therefore are no longer needed.  3) We started sorbitol 70% take 1-5 tablespoons per day mixed in juice, water, or coffee and adjust up slowly to the desired frequency of bowel movements.  4) Cut back on the sweets in hopes of getting your diabetes under better control without medications.  I hope this helps with the weight as well.  5) Keep taking your other medications as you are.  6) I gave you 3 monthly prescriptions for your oxycodone-acetaminophen.  I will see you in 3 months, sooner if necessary.

## 2014-12-27 ENCOUNTER — Institutional Professional Consult (permissible substitution): Payer: Medicare Other | Admitting: Internal Medicine

## 2014-12-30 ENCOUNTER — Encounter (HOSPITAL_COMMUNITY): Payer: Self-pay | Admitting: *Deleted

## 2014-12-30 ENCOUNTER — Encounter (HOSPITAL_COMMUNITY)
Admission: RE | Disposition: A | Discharge status: Home / Self Care | Payer: Self-pay | Source: Ambulatory Visit | Attending: Internal Medicine

## 2014-12-30 ENCOUNTER — Ambulatory Visit (HOSPITAL_COMMUNITY)
Admission: RE | Admit: 2014-12-30 | Discharge: 2014-12-30 | Disposition: A | Discharge status: Home / Self Care | Payer: Medicare Other | Source: Ambulatory Visit | Attending: Internal Medicine | Admitting: Internal Medicine

## 2014-12-30 ENCOUNTER — Ambulatory Visit (HOSPITAL_COMMUNITY): Payer: Medicare Other | Admitting: Certified Registered Nurse Anesthetist

## 2014-12-30 DIAGNOSIS — E669 Obesity, unspecified: Secondary | ICD-10-CM | POA: Diagnosis not present

## 2014-12-30 DIAGNOSIS — J449 Chronic obstructive pulmonary disease, unspecified: Secondary | ICD-10-CM | POA: Diagnosis not present

## 2014-12-30 DIAGNOSIS — I5030 Unspecified diastolic (congestive) heart failure: Secondary | ICD-10-CM | POA: Diagnosis present

## 2014-12-30 DIAGNOSIS — I4589 Other specified conduction disorders: Secondary | ICD-10-CM | POA: Diagnosis not present

## 2014-12-30 DIAGNOSIS — R9431 Abnormal electrocardiogram [ECG] [EKG]: Secondary | ICD-10-CM | POA: Insufficient documentation

## 2014-12-30 DIAGNOSIS — I5032 Chronic diastolic (congestive) heart failure: Secondary | ICD-10-CM | POA: Diagnosis not present

## 2014-12-30 DIAGNOSIS — J45909 Unspecified asthma, uncomplicated: Secondary | ICD-10-CM | POA: Diagnosis not present

## 2014-12-30 DIAGNOSIS — K219 Gastro-esophageal reflux disease without esophagitis: Secondary | ICD-10-CM | POA: Insufficient documentation

## 2014-12-30 DIAGNOSIS — I481 Persistent atrial fibrillation: Secondary | ICD-10-CM | POA: Diagnosis not present

## 2014-12-30 DIAGNOSIS — I10 Essential (primary) hypertension: Secondary | ICD-10-CM | POA: Insufficient documentation

## 2014-12-30 DIAGNOSIS — G4730 Sleep apnea, unspecified: Secondary | ICD-10-CM | POA: Diagnosis not present

## 2014-12-30 DIAGNOSIS — I509 Heart failure, unspecified: Secondary | ICD-10-CM | POA: Diagnosis not present

## 2014-12-30 DIAGNOSIS — M1990 Unspecified osteoarthritis, unspecified site: Secondary | ICD-10-CM | POA: Diagnosis not present

## 2014-12-30 DIAGNOSIS — I2510 Atherosclerotic heart disease of native coronary artery without angina pectoris: Secondary | ICD-10-CM | POA: Insufficient documentation

## 2014-12-30 DIAGNOSIS — E119 Type 2 diabetes mellitus without complications: Secondary | ICD-10-CM | POA: Diagnosis not present

## 2014-12-30 DIAGNOSIS — I4891 Unspecified atrial fibrillation: Secondary | ICD-10-CM | POA: Diagnosis not present

## 2014-12-30 DIAGNOSIS — Z79899 Other long term (current) drug therapy: Secondary | ICD-10-CM | POA: Insufficient documentation

## 2014-12-30 HISTORY — PX: CARDIOVERSION: SHX1299

## 2014-12-30 LAB — GLUCOSE, CAPILLARY: Glucose-Capillary: 137 mg/dL — ABNORMAL HIGH (ref 70–99)

## 2014-12-30 SURGERY — CARDIOVERSION
Anesthesia: General

## 2014-12-30 MED ORDER — SODIUM CHLORIDE 0.9 % IV SOLN
INTRAVENOUS | Status: DC | PRN
  Administered 2014-12-30: 14:00:00 via INTRAVENOUS

## 2014-12-30 MED ORDER — PROPOFOL 10 MG/ML IV BOLUS
INTRAVENOUS | Status: DC | PRN
  Administered 2014-12-30: 80 mg via INTRAVENOUS

## 2014-12-30 MED ORDER — LIDOCAINE HCL (CARDIAC) 20 MG/ML IV SOLN
INTRAVENOUS | Status: DC | PRN
  Administered 2014-12-30: 60 mg via INTRAVENOUS

## 2014-12-30 MED ORDER — FENTANYL CITRATE (PF) 100 MCG/2ML IJ SOLN: INTRAMUSCULAR | Status: DC | PRN

## 2014-12-30 MED ORDER — SODIUM CHLORIDE 0.9 % IV SOLN
INTRAVENOUS | Status: DC
  Administered 2014-12-30: 13:00:00 via INTRAVENOUS

## 2014-12-30 NOTE — Anesthesia Preprocedure Evaluation (Addendum)
Anesthesia Evaluation  Patient identified by MRN, date of birth, ID band Patient awake    Reviewed: Allergy & Precautions, NPO status , Patient's Chart, lab work & pertinent test results, reviewed documented beta blocker date and time   History of Anesthesia Complications Negative for: history of anesthetic complications  Airway Mallampati: III  TM Distance: >3 FB Neck ROM: Full    Dental  (+) Teeth Intact, Dental Advisory Given   Pulmonary shortness of breath, asthma , sleep apnea , COPD breath sounds clear to auscultation        Cardiovascular hypertension, Pt. on home beta blockers and Pt. on medications + angina with exertion + CAD and +CHF Rhythm:Irregular Rate:Abnormal  Echo 06/2014 - Left ventricle: Technically limited study. The cavity size wasmildly dilated. Wall thickness was increased in a pattern of mildLVH. The estimated ejection fraction was 50%. Regional wallmotion abnormalities cannot be excluded. - Left atrium: The atrium was moderately dilated. - Right ventricle: The cavity size was mildly to moderately dilated. Systolic function was mildly to moderately reduced. - Right atrium: The atrium was moderately dilated. - Tricuspid valve: There was moderate regurgitation. - Pulmonary arteries: PA peak pressure: 64 mm Hg (S). - Pericardium, extracardiac: A probable, small pericardial effusionwas identified posterior to the heart.   Neuro/Psych    GI/Hepatic GERD-  ,  Endo/Other  diabetes  Renal/GU Renal InsufficiencyRenal disease     Musculoskeletal  (+) Arthritis -,   Abdominal (+) + obese,   Peds  Hematology   Anesthesia Other Findings   Reproductive/Obstetrics                           Anesthesia Physical Anesthesia Plan  ASA: III  Anesthesia Plan: General   Post-op Pain Management:    Induction: Intravenous  Airway Management Planned: Mask  Additional  Equipment:   Intra-op Plan:   Post-operative Plan:   Informed Consent: I have reviewed the patients History and Physical, chart, labs and discussed the procedure including the risks, benefits and alternatives for the proposed anesthesia with the patient or authorized representative who has indicated his/her understanding and acceptance.   Dental advisory given  Plan Discussed with: CRNA  Anesthesia Plan Comments:         Anesthesia Quick Evaluation

## 2014-12-30 NOTE — Transfer of Care (Signed)
Immediate Anesthesia Transfer of Care Note  Patient: Juan Calderon  Procedure(s) Performed: Procedure(s): CARDIOVERSION (N/A)  Patient Location: Endoscopy Unit  Anesthesia Type:MAC  Level of Consciousness: awake, alert  and oriented  Airway & Oxygen Therapy: Patient Spontanous Breathing and Patient connected to nasal cannula oxygen  Post-op Assessment: Report given to RN, Post -op Vital signs reviewed and stable and Patient moving all extremities  Post vital signs: Reviewed and stable  Last Vitals:  Filed Vitals:   12/30/14 1237  BP: 115/92  Pulse: 82  Temp: 36.6 C  Resp: 16    Complications: No apparent anesthesia complications

## 2014-12-30 NOTE — Anesthesia Postprocedure Evaluation (Signed)
Anesthesia Post Note  Patient: Juan Calderon  Procedure(s) Performed: Procedure(s) (LRB): CARDIOVERSION (N/A)  Anesthesia type: General  Patient location: PACU  Post pain: Pain level controlled  Post assessment: Post-op Vital signs reviewed  Last Vitals: BP 123/65 mmHg  Pulse 57  Temp(Src) 36.5 C (Oral)  Resp 16  SpO2 100%  Post vital signs: Reviewed  Level of consciousness: sedated  Complications: No apparent anesthesia complications

## 2014-12-30 NOTE — Discharge Instructions (Signed)
Electrical Cardioversion Electrical cardioversion is the delivery of a jolt of electricity to change the rhythm of the heart. Sticky patches or metal paddles are placed on the chest to deliver the electricity from a device. This is done to restore a normal rhythm. A rhythm that is too fast or not regular keeps the heart from pumping well. Electrical cardioversion is done in an emergency if:   There is low or no blood pressure as a result of the heart rhythm.   Normal rhythm must be restored as fast as possible to protect the brain and heart from further damage.   It may save a life. Cardioversion may be done for heart rhythms that are not immediately life threatening, such as atrial fibrillation or flutter, in which:   The heart is beating too fast or is not regular.   Medicine to change the rhythm has not worked.   It is safe to wait in order to allow time for preparation.  Symptoms of the abnormal rhythm are bothersome.  The risk of stroke and other serious problems can be reduced. LET [facility] CARE PROVIDER KNOW ABOUT:   Any allergies you have.  All medicines you are taking, including vitamins, herbs, eye drops, creams, and over-the-counter medicines.  Previous problems you or members of your family have had with the use of anesthetics.   Any blood disorders you have.   Previous surgeries you have had.   Medical conditions you have. RISKS AND COMPLICATIONS  Generally, this is a safe procedure. However, problems can occur and include:   Breathing problems related to the anesthetic used.  A blood clot that breaks free and travels to other parts of your body. This could cause a stroke or other problems. The risk of this is lowered by use of blood-thinning medicine (anticoagulant) prior to the procedure.  Cardiac arrest (rare). BEFORE THE PROCEDURE   You may have tests to detect blood clots in your heart and to evaluate heart function.  You may start taking  anticoagulants so your blood does not clot as easily.   Medicines may be given to help stabilize your heart rate and rhythm. PROCEDURE  You will be given medicine through an IV tube to reduce discomfort and make you sleepy (sedative).   An electrical shock will be delivered. AFTER THE PROCEDURE Your heart rhythm will be watched to make sure it does not change.  Document Released: 08/10/2002 Document Revised: 01/04/2014 Document Reviewed: 03/04/2013 [facility] Patient Information 2015 [place], [place]. This information is not intended to replace advice given to you by your health care provider. Make sure you discuss any questions you have with your health care provider. Conscious Sedation Sedation is the use of medicines to promote relaxation and relieve discomfort and anxiety. Conscious sedation is a type of sedation. Under conscious sedation you are less alert than normal but are still able to respond to instructions or stimulation. Conscious sedation is used during short medical and dental procedures. It is milder than deep sedation or general anesthesia and allows you to return to your regular activities sooner.  LET [facility] CARE PROVIDER KNOW ABOUT:   Any allergies you have.  All medicines you are taking, including vitamins, herbs, eye drops, creams, and over-the-counter medicines.  Use of steroids (by mouth or creams).  Previous problems you or members of your family have had with the use of anesthetics.  Any blood disorders you have.  Previous surgeries you have had.  Medical conditions you have.  Possibility  of pregnancy, if this applies.  Use of cigarettes, alcohol, or illegal drugs. RISKS AND COMPLICATIONS Generally, this is a safe procedure. However, as with any procedure, problems can occur. Possible problems include:  Oversedation.  Trouble breathing on your own. You may need to have a breathing tube until you are awake and breathing on your own.  Allergic  reaction to any of the medicines used for the procedure. BEFORE THE PROCEDURE  You may have blood tests done. These tests can help show how well your kidneys and liver are working. They can also show how well your blood clots.  A physical exam will be done.  Only take medicines as directed by your health care provider. You may need to stop taking medicines (such as blood thinners, aspirin, or nonsteroidal anti-inflammatory drugs) before the procedure.   Do not eat or drink at least 6 hours before the procedure or as directed by your health care provider.  Arrange for a responsible adult, family member, or friend to take you home after the procedure. He or she should stay with you for at least 24 hours after the procedure, until the medicine has worn off. PROCEDURE   An intravenous (IV) catheter will be inserted into one of your veins. Medicine will be able to flow directly into your body through this catheter. You may be given medicine through this tube to help prevent pain and help you relax.  The medical or dental procedure will be done. AFTER THE PROCEDURE  You will stay in a recovery area until the medicine has worn off. Your blood pressure and pulse will be checked.   Depending on the procedure you had, you may be allowed to go home when you can tolerate liquids and your pain is under control. Document Released: 05/15/2001 Document Revised: 08/25/2013 Document Reviewed: 04/27/2013 [facility] Patient Information 2015 [place], [place]. This information is not intended to replace advice given to you by your health care provider. Make sure you discuss any questions you have with your health care provider. Electrical Cardioversion, Care After Refer to this sheet in the next few weeks. These instructions provide you with information on caring for yourself after your procedure. Your health care provider may also give you more specific instructions. Your treatment has been planned according  to current medical practices, but problems sometimes occur. Call your health care provider if you have any problems or questions after your procedure. WHAT TO EXPECT AFTER THE PROCEDURE After your procedure, it is typical to have the following sensations:  Some redness on the skin where the shocks were delivered. If this is tender, a sunburn lotion or hydrocortisone cream may help.  Possible return of an abnormal heart rhythm within hours or days after the procedure. HOME CARE INSTRUCTIONS  Take medicines only as directed by your health care provider. Be sure you understand how and when to take your medicine.  Learn how to feel your pulse and check it often.  Limit your activity for 48 hours after the procedure or as directed by your health care provider.  Avoid or minimize caffeine and other stimulants as directed by your health care provider. SEEK MEDICAL CARE IF:  You feel like your heart is beating too fast or your pulse is not regular.  You have any questions about your medicines.  You have bleeding that will not stop. SEEK IMMEDIATE MEDICAL CARE IF:  You are dizzy or feel faint.  It is hard to breathe or you feel short of breath.  There is a change in discomfort in your chest.  Your speech is slurred or you have trouble moving an arm or leg on one side of your body.  You get a serious muscle cramp that does not go away.  Your fingers or toes turn cold or blue. Document Released: 06/10/2013 Document Revised: 01/04/2014 Document Reviewed: 06/10/2013 [facility] Patient Information 2015 [place], [place]. This information is not intended to replace advice given to you by your health care provider. Make sure you discuss any questions you have with your health care provider.

## 2014-12-30 NOTE — CV Procedure (Signed)
    CARDIOVERSION NOTE  Procedure: Electrical Cardioversion Indications:  Atrial Fibrillation  Procedure Details:  Consent: Risks of procedure as well as the alternatives and risks of each were explained to the (patient/caregiver).  Consent for procedure obtained.  Time Out: Verified patient identification, verified procedure, site/side was marked, verified correct patient position, special equipment/implants available, medications/allergies/relevent history reviewed, required imaging and test results available.  Performed  Patient placed on cardiac monitor, pulse oximetry, supplemental oxygen as necessary.  Sedation given: Propofol per anesthesia Pacer pads placed anterior and posterior chest.  Cardioverted 1 time(s).  Cardioverted at 150J biphasic.  Impression: Findings: Post procedure EKG shows: NSR Complications: None Patient did tolerate procedure well.  Plan: 1. Continue flecainide and warfarin. Follow-up with Dr. [name].  Time Spent Directly with the Patient:  30 minutes   [name], MD, [facility] Attending Cardiologist [facility]  Cody Oliger C 12/30/2014, 2:26 PM

## 2014-12-30 NOTE — H&P (Signed)
     INTERVAL PROCEDURE H&P  History and Physical Interval Note:  12/30/2014 2:00 PM  Juan Calderon has presented today for their planned procedure. The various methods of treatment have been discussed with the patient and family. After consideration of risks, benefits and other options for treatment, the patient has consented to the procedure.  The patients' outpatient history has been reviewed, patient examined, and no change in status from most recent office note within the past 30 days. I have reviewed the patients' chart and labs and will proceed as planned. Questions were answered to the patient's satisfaction.   [name], MD, [facility] Attending Cardiologist [facility]  Astha Probasco C 12/30/2014, 2:00 PM

## 2014-12-31 ENCOUNTER — Encounter (HOSPITAL_COMMUNITY): Payer: Self-pay | Admitting: Internal Medicine

## 2015-01-04 DIAGNOSIS — M1712 Unilateral primary osteoarthritis, left knee: Secondary | ICD-10-CM | POA: Diagnosis not present

## 2015-01-06 ENCOUNTER — Other Ambulatory Visit (HOSPITAL_COMMUNITY): Payer: Self-pay | Admitting: Internal Medicine

## 2015-01-06 ENCOUNTER — Ambulatory Visit (HOSPITAL_COMMUNITY): Payer: Medicare Other | Attending: Internal Medicine

## 2015-01-06 DIAGNOSIS — I482 Chronic atrial fibrillation, unspecified: Secondary | ICD-10-CM

## 2015-01-06 DIAGNOSIS — I4891 Unspecified atrial fibrillation: Secondary | ICD-10-CM | POA: Insufficient documentation

## 2015-01-06 MED ORDER — PERFLUTREN LIPID MICROSPHERE: 1.0000 mL | Freq: Once | INTRAVENOUS | Status: AC

## 2015-01-06 NOTE — Progress Notes (Addendum)
2D Echo with Definity completed. 01/06/2015

## 2015-01-20 ENCOUNTER — Other Ambulatory Visit: Payer: Self-pay | Admitting: Internal Medicine

## 2015-01-20 ENCOUNTER — Encounter (HOSPITAL_COMMUNITY): Payer: Self-pay | Admitting: Nurse Practitioner

## 2015-01-20 ENCOUNTER — Ambulatory Visit (HOSPITAL_COMMUNITY)
Admission: RE | Admit: 2015-01-20 | Discharge: 2015-01-20 | Disposition: A | Discharge status: Home / Self Care | Payer: Medicare Other | Source: Ambulatory Visit | Attending: Nurse Practitioner | Admitting: Nurse Practitioner

## 2015-01-20 ENCOUNTER — Ambulatory Visit (INDEPENDENT_AMBULATORY_CARE_PROVIDER_SITE_OTHER): Payer: Medicare Other | Admitting: *Deleted

## 2015-01-20 VITALS — BP 116/80 | HR 59 | Ht 71.0 in | Wt 285.2 lb

## 2015-01-20 DIAGNOSIS — I481 Persistent atrial fibrillation: Secondary | ICD-10-CM | POA: Diagnosis not present

## 2015-01-20 DIAGNOSIS — Z7901 Long term (current) use of anticoagulants: Secondary | ICD-10-CM | POA: Insufficient documentation

## 2015-01-20 DIAGNOSIS — I4819 Other persistent atrial fibrillation: Secondary | ICD-10-CM

## 2015-01-20 DIAGNOSIS — F1099 Alcohol use, unspecified with unspecified alcohol-induced disorder: Secondary | ICD-10-CM | POA: Insufficient documentation

## 2015-01-20 DIAGNOSIS — I4891 Unspecified atrial fibrillation: Secondary | ICD-10-CM | POA: Diagnosis not present

## 2015-01-20 DIAGNOSIS — G4733 Obstructive sleep apnea (adult) (pediatric): Secondary | ICD-10-CM | POA: Insufficient documentation

## 2015-01-20 DIAGNOSIS — Z5181 Encounter for therapeutic drug level monitoring: Secondary | ICD-10-CM

## 2015-01-20 LAB — POCT INR: INR: 2.2

## 2015-01-20 NOTE — Progress Notes (Signed)
Patient ID: Juan Calderon, male   DOB: 1955/10/17, 59 y.o.   MRN: [id number]     Primary Care Physician: Karren Cobble, MD Referring Physician: Jolyn Nap, MD   Juan Calderon is a 59 y.o. male with a h/o insignificant CAD (25%LAD stenosis by cath 2007), HTN, chronic diastolic heart failure, OSA intolerant of cpap, morbid obesity, alcohol abuse, here for evaluation in the afib clinic following initiation of flecainide and cardioversion 4/28. Ekg reveals SB at 59 bpm. States tolerating flecainide without side effects. Had INR checked this am and is range. Recently has a sleep study but cannot tolerate face mask despite trying a few different ones. Feels less short of breath in SR but energy level still is poor. Is drinking 4 alcoholic drinks a night "brown Liquor". States cant exercise due to his breathing. Little caffeine use and no tobacco use. Recent echo shows normal EF with mild LVH. Left atrial size normal.  Today, he denies symptoms of palpitations, chest pain, shortness of breath, orthopnea, PND, lower extremity edema, dizziness, presyncope, syncope, or neurologic sequela. The patient is tolerating medications without difficulties and is otherwise without complaint today.   Past Medical History  Diagnosis Date  . Essential hypertension 09/20/2011  . Atrial fibrillation 02/04/2012  . Chronic diastolic heart failure 05/08/6212    with mild left ventricular hypertrophy on Echo 02/2010  . Chronic pain syndrome 01/15/2013    Left arm and leg s/p traumatic injury   . Osteoarthritis cervical spine 04/25/2013  . Gastroesophageal reflux disease 04/25/2013  . Open-angle glaucoma 04/25/2013  . 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 04/25/2013  . Hyperlipidemia LDL goal < 100 04/25/2013  . Coronary artery disease 04/25/2013    25% LAD stenosis on cath 2007.  Stable angina.  . Asthma, chronic 04/25/2013    Clinical diagnosis  . Gout 04/25/2013  . Right rotator cuff tear  04/25/2013    Large full-thickness tear of the supraspinatus with mild retraction but no atrophy   . Vasomotor rhinitis 04/25/2013  . Obstructive sleep apnea 06/01/2013    Moderate, AHI 29.8 per hour with moderately loud snoring and oxygen desaturation to a nadir of 79%. CPAP titration resulted in a prescription for 17 CWP.    [name] Ulcer     History of ulcer in the remote past  . Cataract     Left eye  . Onychomycosis of toenail 12/24/2012    Left great toe   . Morbid obesity with BMI of 40.0-44.9, adult 04/25/2013  . Blood transfusion without reported diagnosis   . Chronic obstructive pulmonary disease 04/25/2013  . Diverticulosis 11/12/2013  . Osteoarthritis of left knee 06/19/2013    Tricompartmental disease.  Treated with double hinged upright knee brace, steroid/xylocaine knee injections, and NSAIDs   . CHF (congestive heart failure)   . Shortness of breath   . Chronic renal insufficiency 07/12/2014   Past Surgical History  Procedure Laterality Date  . Left leg surgery    . Left arm surgery    . Shoulder surgery      Right  . Fracture surgery Left 1980's    Elbow  . Cardioversion N/A 12/30/2014    Procedure: CARDIOVERSION;  Surgeon: [name], MD;  [facility] END[facility]COPY;  Service: Cardiovascular;  Laterality: N/A;    Current Outpatient Prescriptions  Medication Sig Dispense Refill  . allopurinol (ZYLOPRIM) 300 MG tablet Take 1 tablet (300 mg total) by mouth daily. 90 tablet 3  . atorvastatin (LIPITOR)  10 MG tablet Take 1 tablet (10 mg total) by mouth daily. 90 tablet 3  . esomeprazole (NEXIUM) 40 MG capsule Take 1 capsule (40 mg total) by mouth daily at 12 noon. 90 capsule 3  . fenofibrate 160 MG tablet Take 1 tablet (160 mg total) by mouth daily. 90 tablet 3  . flecainide (TAMBOCOR) 150 MG tablet Take 1 tablet (150 mg total) by mouth 2 (two) times daily. 60 tablet 3  . latanoprost (XALATAN) 0.005 % ophthalmic solution Place 1 drop into both eyes at bedtime. 7.5 mL 3  .  metoprolol (LOPRESSOR) 50 MG tablet Take 1 tablet (50 mg total) by mouth 2 (two) times daily. 180 tablet 3  . nitroGLYCERIN (NITROSTAT) 0.4 MG SL tablet Place 0.4 mg under the tongue every 5 (five) minutes as needed for chest pain.    [name] oxyCODONE-acetaminophen (PERCOCET) 10-325 MG per tablet Take 1 tablet by mouth every 6 (six) hours as needed for pain. 120 tablet 0  . sorbitol 70 % solution Take 15-60 mLs by mouth daily as needed (mix in coffee, juice, or water). 473 mL 11  . terazosin (HYTRIN) 5 MG capsule Take 1 capsule (5 mg total) by mouth at bedtime. 90 capsule 3  . torsemide (DEMADEX) 20 MG tablet Take 2 tablets (40 mg total) by mouth daily. 60 tablet 3  . valsartan (DIOVAN) 320 MG tablet Take 1 tablet (320 mg total) by mouth daily. 90 tablet 3  . warfarin (COUMADIN) 5 MG tablet Take 1/2 tablet (2.5 mg) by mouth every day except on Sundays take 1 tablet (5 mg) 30 tablet 1   No current facility-administered medications for this encounter.    Allergies  Allergen Reactions  . Ramipril Swelling  . Testosterone Rash    History   Social History  . Marital Status: Widowed    Spouse Name: N/A  . Number of Children: N/A  . Years of Education: N/A   Occupational History  . Not on file.   Social History Main Topics  . Smoking status: Never Smoker   . Smokeless tobacco: Never Used  . Alcohol Use: 1.0 oz/week    2 drink(s) per week     Comment: Liquor.  . Drug Use: No  . Sexual Activity: Yes    Birth Control/ Protection: None   Other Topics Concern  . Not on file   Social History Narrative    Family History  Problem Relation Age of Onset  . Heart failure Mother   . Alzheimer's disease Father   . Healthy Sister   . Healthy Brother   . Healthy Son   . Healthy Sister   . Healthy Sister   . Healthy Sister   . Healthy Sister   . Healthy Brother   . Healthy Brother   . Healthy Brother   . Heart failure Brother   . Healthy Brother   . Osteoarthritis Brother   .  Prostate cancer Brother   . Early death Brother     Manufacturing systems engineer  . Heart attack Neg Hx   . Stroke Neg Hx   . Hypertension Sister   . Hypertension Brother     ROS- All systems are reviewed and negative except as per the HPI above  Physical Exam: Filed Vitals:   01/20/15 1147  BP: 116/80  Pulse: 59  Height: 5\' 11"  (1.803 m)  Weight: 285 lb 3.2 oz (129.366 kg)    GEN- The patient is well appearing, alert and oriented x 3  today, with flat affect.  Head- normocephalic, atraumatic Eyes-  Sclera clear, conjunctiva pink Ears- hearing intact Oropharynx- clear Neck- supple, no JVP Lymph- no cervical lymphadenopathy Lungs- Clear to ausculation bilaterally, normal work of breathing Heart- Regular rate and rhythm, no murmurs, rubs or gallops, PMI not laterally displaced GI- soft, NT, ND, + BS Extremities- no clubbing, cyanosis, or edema MS- no significant deformity or atrophy Skin- no rash or lesion Psych- euthymic mood, full affect Neuro- strength and sensation are intact  EKG- Sinus bradycardia, IRBBB,Prolonged QT  PR int 208 ms, QRS 116 ms, QTc 510 ms ( messaged Dr. [name] to review)  Echo- 01/06/15 -Left ventricle: LVEF is approximately 55 to 60% The cavity size was normal. Wall thickness was increased in a pattern of mild LVH. - Pulmonary arteries: PA peak pressure: 39 mm Hg (S). Epic records reviewed.  Assessment and Plan:  1. Afib Maintaining SR s/p cardioversion and flecainide 150 mg Continue current treatment  2. CHA2DS2VASc score of at least 4 Continue warfarin INR today 2.2  3. OSA Did not tolerate CPAP  He is aware of high incidence of recurrent afib in the setting of untreated sleep apnea.  4. Alcohol use Asked to limit to 2 drinks weekly  5. Morbid obesity Weight loss and increase in physical activity encouraged    F/u with Dr [name] in 3 months

## 2015-01-27 ENCOUNTER — Other Ambulatory Visit: Payer: Self-pay | Admitting: Internal Medicine

## 2015-01-27 DIAGNOSIS — H4010X0 Unspecified open-angle glaucoma, stage unspecified: Secondary | ICD-10-CM

## 2015-02-01 DIAGNOSIS — M545 Low back pain: Secondary | ICD-10-CM | POA: Diagnosis not present

## 2015-02-01 DIAGNOSIS — M542 Cervicalgia: Secondary | ICD-10-CM | POA: Diagnosis not present

## 2015-02-10 DIAGNOSIS — E119 Type 2 diabetes mellitus without complications: Secondary | ICD-10-CM | POA: Diagnosis not present

## 2015-02-10 DIAGNOSIS — H2513 Age-related nuclear cataract, bilateral: Secondary | ICD-10-CM | POA: Diagnosis not present

## 2015-02-10 DIAGNOSIS — H40013 Open angle with borderline findings, low risk, bilateral: Secondary | ICD-10-CM | POA: Diagnosis not present

## 2015-02-10 DIAGNOSIS — H04123 Dry eye syndrome of bilateral lacrimal glands: Secondary | ICD-10-CM | POA: Diagnosis not present

## 2015-02-10 LAB — HM DIABETES EYE EXAM

## 2015-02-23 ENCOUNTER — Encounter: Payer: Self-pay | Admitting: *Deleted

## 2015-02-28 ENCOUNTER — Inpatient Hospital Stay (HOSPITAL_COMMUNITY): Payer: Medicare Other

## 2015-02-28 ENCOUNTER — Encounter: Payer: Self-pay | Admitting: Internal Medicine

## 2015-02-28 ENCOUNTER — Inpatient Hospital Stay (HOSPITAL_COMMUNITY)
Admission: EM | Admit: 2015-02-28 | Discharge: 2015-03-03 | DRG: 683 | Disposition: A | Discharge status: Home / Self Care | Payer: Medicare Other | Attending: Oncology | Admitting: Oncology

## 2015-02-28 ENCOUNTER — Emergency Department (HOSPITAL_COMMUNITY): Payer: Medicare Other

## 2015-02-28 ENCOUNTER — Encounter (HOSPITAL_COMMUNITY): Payer: Self-pay | Admitting: Emergency Medicine

## 2015-02-28 ENCOUNTER — Ambulatory Visit: Payer: Medicare Other | Admitting: Podiatry

## 2015-02-28 DIAGNOSIS — I481 Persistent atrial fibrillation: Secondary | ICD-10-CM | POA: Diagnosis present

## 2015-02-28 DIAGNOSIS — N281 Cyst of kidney, acquired: Secondary | ICD-10-CM | POA: Diagnosis not present

## 2015-02-28 DIAGNOSIS — N179 Acute kidney failure, unspecified: Secondary | ICD-10-CM | POA: Diagnosis not present

## 2015-02-28 DIAGNOSIS — I5032 Chronic diastolic (congestive) heart failure: Secondary | ICD-10-CM | POA: Diagnosis present

## 2015-02-28 DIAGNOSIS — E869 Volume depletion, unspecified: Secondary | ICD-10-CM | POA: Diagnosis not present

## 2015-02-28 DIAGNOSIS — Z79891 Long term (current) use of opiate analgesic: Secondary | ICD-10-CM | POA: Diagnosis not present

## 2015-02-28 DIAGNOSIS — N400 Enlarged prostate without lower urinary tract symptoms: Secondary | ICD-10-CM | POA: Diagnosis present

## 2015-02-28 DIAGNOSIS — Z9119 Patient's noncompliance with other medical treatment and regimen: Secondary | ICD-10-CM

## 2015-02-28 DIAGNOSIS — I10 Essential (primary) hypertension: Secondary | ICD-10-CM

## 2015-02-28 DIAGNOSIS — G253 Myoclonus: Secondary | ICD-10-CM | POA: Diagnosis present

## 2015-02-28 DIAGNOSIS — J449 Chronic obstructive pulmonary disease, unspecified: Secondary | ICD-10-CM | POA: Diagnosis present

## 2015-02-28 DIAGNOSIS — G894 Chronic pain syndrome: Secondary | ICD-10-CM

## 2015-02-28 DIAGNOSIS — M179 Osteoarthritis of knee, unspecified: Secondary | ICD-10-CM | POA: Diagnosis present

## 2015-02-28 DIAGNOSIS — Z6841 Body Mass Index (BMI) 40.0 and over, adult: Secondary | ICD-10-CM | POA: Diagnosis not present

## 2015-02-28 DIAGNOSIS — Z888 Allergy status to other drugs, medicaments and biological substances status: Secondary | ICD-10-CM

## 2015-02-28 DIAGNOSIS — E875 Hyperkalemia: Secondary | ICD-10-CM | POA: Diagnosis not present

## 2015-02-28 DIAGNOSIS — Z8249 Family history of ischemic heart disease and other diseases of the circulatory system: Secondary | ICD-10-CM

## 2015-02-28 DIAGNOSIS — F1010 Alcohol abuse, uncomplicated: Secondary | ICD-10-CM | POA: Insufficient documentation

## 2015-02-28 DIAGNOSIS — R002 Palpitations: Secondary | ICD-10-CM

## 2015-02-28 DIAGNOSIS — N19 Unspecified kidney failure: Secondary | ICD-10-CM | POA: Insufficient documentation

## 2015-02-28 DIAGNOSIS — K219 Gastro-esophageal reflux disease without esophagitis: Secondary | ICD-10-CM

## 2015-02-28 DIAGNOSIS — T502X5A Adverse effect of carbonic-anhydrase inhibitors, benzothiadiazides and other diuretics, initial encounter: Secondary | ICD-10-CM | POA: Diagnosis present

## 2015-02-28 DIAGNOSIS — Z79899 Other long term (current) drug therapy: Secondary | ICD-10-CM | POA: Diagnosis not present

## 2015-02-28 DIAGNOSIS — J45909 Unspecified asthma, uncomplicated: Secondary | ICD-10-CM | POA: Diagnosis present

## 2015-02-28 DIAGNOSIS — I482 Chronic atrial fibrillation: Secondary | ICD-10-CM

## 2015-02-28 DIAGNOSIS — D649 Anemia, unspecified: Secondary | ICD-10-CM | POA: Diagnosis present

## 2015-02-28 DIAGNOSIS — F1020 Alcohol dependence, uncomplicated: Secondary | ICD-10-CM

## 2015-02-28 DIAGNOSIS — H4010X0 Unspecified open-angle glaucoma, stage unspecified: Secondary | ICD-10-CM | POA: Diagnosis present

## 2015-02-28 DIAGNOSIS — M109 Gout, unspecified: Secondary | ICD-10-CM | POA: Diagnosis present

## 2015-02-28 DIAGNOSIS — G4733 Obstructive sleep apnea (adult) (pediatric): Secondary | ICD-10-CM

## 2015-02-28 DIAGNOSIS — N189 Chronic kidney disease, unspecified: Secondary | ICD-10-CM

## 2015-02-28 DIAGNOSIS — E785 Hyperlipidemia, unspecified: Secondary | ICD-10-CM | POA: Diagnosis present

## 2015-02-28 DIAGNOSIS — R791 Abnormal coagulation profile: Secondary | ICD-10-CM | POA: Insufficient documentation

## 2015-02-28 DIAGNOSIS — Z7901 Long term (current) use of anticoagulants: Secondary | ICD-10-CM

## 2015-02-28 DIAGNOSIS — I2510 Atherosclerotic heart disease of native coronary artery without angina pectoris: Secondary | ICD-10-CM | POA: Diagnosis present

## 2015-02-28 DIAGNOSIS — I480 Paroxysmal atrial fibrillation: Secondary | ICD-10-CM | POA: Diagnosis not present

## 2015-02-28 DIAGNOSIS — N182 Chronic kidney disease, stage 2 (mild): Secondary | ICD-10-CM | POA: Diagnosis present

## 2015-02-28 DIAGNOSIS — E119 Type 2 diabetes mellitus without complications: Secondary | ICD-10-CM

## 2015-02-28 DIAGNOSIS — I5030 Unspecified diastolic (congestive) heart failure: Secondary | ICD-10-CM

## 2015-02-28 DIAGNOSIS — E1140 Type 2 diabetes mellitus with diabetic neuropathy, unspecified: Secondary | ICD-10-CM | POA: Diagnosis present

## 2015-02-28 HISTORY — DX: Alcohol abuse, uncomplicated: F10.10

## 2015-02-28 HISTORY — DX: Chronic kidney disease, unspecified: N18.9

## 2015-02-28 LAB — URINALYSIS, ROUTINE W REFLEX MICROSCOPIC
BILIRUBIN URINE: NEGATIVE
Glucose, UA: 100 mg/dL — AB
Hgb urine dipstick: NEGATIVE
Ketones, ur: NEGATIVE mg/dL
Leukocytes, UA: NEGATIVE
Nitrite: NEGATIVE
PH: 5 (ref 5.0–8.0)
Protein, ur: NEGATIVE mg/dL
Specific Gravity, Urine: 1.011 (ref 1.005–1.030)
Urobilinogen, UA: 0.2 mg/dL (ref 0.0–1.0)

## 2015-02-28 LAB — CBC
HCT: 35.9 % — ABNORMAL LOW (ref 39.0–52.0)
HCT: 33.5 % — ABNORMAL LOW (ref 39.0–52.0)
HEMOGLOBIN: 11.6 g/dL — AB (ref 13.0–17.0)
Hemoglobin: 10.9 g/dL — ABNORMAL LOW (ref 13.0–17.0)
MCH: 29.7 pg (ref 26.0–34.0)
MCH: 29.8 pg (ref 26.0–34.0)
MCHC: 32.3 g/dL (ref 30.0–36.0)
MCHC: 32.5 g/dL (ref 30.0–36.0)
MCV: 92.1 fL (ref 78.0–100.0)
MCV: 91.5 fL (ref 78.0–100.0)
PLATELETS: 163 10*3/uL (ref 150–400)
Platelets: 174 10*3/uL (ref 150–400)
RBC: 3.9 MIL/uL — ABNORMAL LOW (ref 4.22–5.81)
RBC: 3.66 MIL/uL — ABNORMAL LOW (ref 4.22–5.81)
RDW: 14.9 % (ref 11.5–15.5)
RDW: 14.7 % (ref 11.5–15.5)
WBC: 6.4 10*3/uL (ref 4.0–10.5)
WBC: 5.1 10*3/uL (ref 4.0–10.5)

## 2015-02-28 LAB — MAGNESIUM
MAGNESIUM: 1.3 mg/dL — AB (ref 1.7–2.4)
Magnesium: 1.6 mg/dL — ABNORMAL LOW (ref 1.7–2.4)

## 2015-02-28 LAB — BASIC METABOLIC PANEL
Anion gap: 11 (ref 5–15)
Anion gap: 11 (ref 5–15)
BUN: 105 mg/dL — AB (ref 6–20)
BUN: 99 mg/dL — ABNORMAL HIGH (ref 6–20)
CO2: 24 mmol/L (ref 22–32)
CO2: 25 mmol/L (ref 22–32)
CREATININE: 4.56 mg/dL — AB (ref 0.61–1.24)
Calcium: 9.9 mg/dL (ref 8.9–10.3)
Calcium: 9.8 mg/dL (ref 8.9–10.3)
Chloride: 101 mmol/L (ref 101–111)
Chloride: 101 mmol/L (ref 101–111)
Creatinine, Ser: 4.02 mg/dL — ABNORMAL HIGH (ref 0.61–1.24)
GFR calc non Af Amer: 13 mL/min — ABNORMAL LOW (ref >60)
GFR, EST AFRICAN AMERICAN: 15 mL/min — AB (ref >60)
GFR, EST AFRICAN AMERICAN: 17 mL/min — AB (ref >60)
GFR, EST NON AFRICAN AMERICAN: 15 mL/min — AB (ref >60)
Glucose, Bld: 126 mg/dL — ABNORMAL HIGH (ref 65–99)
Glucose, Bld: 148 mg/dL — ABNORMAL HIGH (ref 65–99)
Potassium: 5.1 mmol/L (ref 3.5–5.1)
Potassium: 4.9 mmol/L (ref 3.5–5.1)
SODIUM: 137 mmol/L (ref 135–145)
Sodium: 136 mmol/L (ref 135–145)

## 2015-02-28 LAB — I-STAT TROPONIN, ED: Troponin i, poc: 0.01 ng/mL (ref 0.00–0.08)

## 2015-02-28 LAB — GLUCOSE, CAPILLARY: GLUCOSE-CAPILLARY: 103 mg/dL — AB (ref 65–99)

## 2015-02-28 LAB — PROTIME-INR
INR: 3.49 — ABNORMAL HIGH (ref 0.00–1.49)
Prothrombin Time: 34.3 seconds — ABNORMAL HIGH (ref 11.6–15.2)

## 2015-02-28 LAB — PHOSPHORUS: Phosphorus: 4.1 mg/dL (ref 2.5–4.6)

## 2015-02-28 LAB — CK: Total CK: 155 U/L (ref 49–397)

## 2015-02-28 LAB — HEPATIC FUNCTION PANEL
ALBUMIN: 3.9 g/dL (ref 3.5–5.0)
ALT: 31 U/L (ref 17–63)
AST: 31 U/L (ref 15–41)
Alkaline Phosphatase: 30 U/L — ABNORMAL LOW (ref 38–126)
Bilirubin, Direct: 0.2 mg/dL (ref 0.1–0.5)
Indirect Bilirubin: 0.3 mg/dL (ref 0.3–0.9)
TOTAL PROTEIN: 7.4 g/dL (ref 6.5–8.1)
Total Bilirubin: 0.5 mg/dL (ref 0.3–1.2)

## 2015-02-28 LAB — ETHANOL

## 2015-02-28 LAB — TSH: TSH: 5.118 u[IU]/mL — ABNORMAL HIGH (ref 0.350–4.500)

## 2015-02-28 LAB — T4, FREE: Free T4: 0.89 ng/dL (ref 0.61–1.12)

## 2015-02-28 MED ORDER — FOLIC ACID 1 MG PO TABS
1.0000 mg | ORAL_TABLET | Freq: Every day | ORAL | Status: DC
  Administered 2015-02-28 – 2015-03-03 (×4): 1 mg via ORAL
  Filled 2015-02-28 (×4): qty 1

## 2015-02-28 MED ORDER — LATANOPROST 0.005 % OP SOLN
1.0000 [drp] | Freq: Every day | OPHTHALMIC | Status: DC
  Administered 2015-02-28 – 2015-03-02 (×3): 1 [drp] via OPHTHALMIC
  Filled 2015-02-28: qty 2.5

## 2015-02-28 MED ORDER — SODIUM CHLORIDE 0.9 % IV SOLN
INTRAVENOUS | Status: DC
  Administered 2015-02-28 (×2): via INTRAVENOUS

## 2015-02-28 MED ORDER — TERAZOSIN HCL 5 MG PO CAPS
5.0000 mg | ORAL_CAPSULE | Freq: Every day | ORAL | Status: DC
  Administered 2015-02-28 – 2015-03-02 (×3): 5 mg via ORAL
  Filled 2015-02-28 (×4): qty 1

## 2015-02-28 MED ORDER — SODIUM CHLORIDE 0.9 % IJ SOLN
3.0000 mL | Freq: Two times a day (BID) | INTRAMUSCULAR | Status: DC
  Administered 2015-02-28 – 2015-03-03 (×4): 3 mL via INTRAVENOUS

## 2015-02-28 MED ORDER — LORAZEPAM 1 MG PO TABS
1.0000 mg | ORAL_TABLET | Freq: Four times a day (QID) | ORAL | Status: AC | PRN
  Filled 2015-02-28: qty 1

## 2015-02-28 MED ORDER — ADULT MULTIVITAMIN W/MINERALS CH
1.0000 | ORAL_TABLET | Freq: Every day | ORAL | Status: DC
  Administered 2015-02-28 – 2015-03-03 (×4): 1 via ORAL
  Filled 2015-02-28 (×4): qty 1

## 2015-02-28 MED ORDER — THIAMINE HCL 100 MG/ML IJ SOLN
100.0000 mg | Freq: Every day | INTRAMUSCULAR | Status: DC
  Filled 2015-02-28 (×4): qty 1

## 2015-02-28 MED ORDER — VITAMIN B-1 100 MG PO TABS
100.0000 mg | ORAL_TABLET | Freq: Every day | ORAL | Status: DC
Start: 2015-02-28 — End: 2015-03-03
  Administered 2015-02-28 – 2015-03-03 (×4): 100 mg via ORAL
  Filled 2015-02-28 (×4): qty 1

## 2015-02-28 MED ORDER — LORAZEPAM 2 MG/ML IJ SOLN: 1.0000 mg | Freq: Four times a day (QID) | INTRAMUSCULAR | Status: AC | PRN

## 2015-02-28 MED ORDER — FENOFIBRATE 160 MG PO TABS
160.0000 mg | ORAL_TABLET | Freq: Every day | ORAL | Status: DC
  Administered 2015-02-28: 160 mg via ORAL
  Filled 2015-02-28: qty 1

## 2015-02-28 MED ORDER — MAGNESIUM SULFATE 2 GM/50ML IV SOLN
2.0000 g | Freq: Once | INTRAVENOUS | Status: AC
  Administered 2015-02-28: 2 g via INTRAVENOUS
  Filled 2015-02-28: qty 50

## 2015-02-28 MED ORDER — SODIUM CHLORIDE 0.9 % IV SOLN
2.0000 g | Freq: Once | INTRAVENOUS | Status: AC
  Administered 2015-02-28: 2 g via INTRAVENOUS
  Filled 2015-02-28: qty 20

## 2015-02-28 MED ORDER — SODIUM CHLORIDE 0.9 % IV SOLN
INTRAVENOUS | Status: AC
  Administered 2015-03-01: 06:00:00 via INTRAVENOUS

## 2015-02-28 MED ORDER — ALLOPURINOL 300 MG PO TABS
300.0000 mg | ORAL_TABLET | Freq: Every day | ORAL | Status: DC
  Administered 2015-02-28: 300 mg via ORAL
  Filled 2015-02-28: qty 1

## 2015-02-28 NOTE — Plan of Care (Signed)
Problem: Phase I Progression Outcomes Goal: Initial discharge plan identified Outcome: Completed/Met Date Met:  02/28/15 Return home

## 2015-02-28 NOTE — Plan of Care (Signed)
Problem: Phase I Progression Outcomes Goal: OOB as tolerated unless otherwise ordered Outcome: Completed/Met Date Met:  02/28/15 OOB to chair this afternoon and evening

## 2015-02-28 NOTE — Progress Notes (Signed)
MD paged about post void bladder scan. Order received to cancel post void residual. Reinforced education with patient to use urinal for all voids. Patient verbalized understanding. Will continue to monitor. Bartholomew Crews, RN

## 2015-02-28 NOTE — Progress Notes (Signed)
Patient ordered to be on strict I &O. Education provided to patient. Patient contnued to ambulate to bathroom and not use urinal. Repeated to patient to use urinal even if ambulating to bathroom. Patient voided 250 cc in urinal. Post void bladder scan with max 112 cc. Will continue to monitor. Bartholomew Crews, RN
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
## 2015-02-28 NOTE — H&P (Signed)
Date: 02/28/2015               Patient Name:  Juan Calderon MRN: [id number]  DOB: [birth year] Age / Sex: 59 y.o., male   PCP: Oval Linsey, MD         Medical Service: Internal Medicine Teaching Service         Attending Physician: Dr. Annia Belt, MD    First Contact: Dr. Arcelia Jew Pager: [phone]  Second Contact: Dr. Gordy Levan Pager: [phone]       After Hours (After 5p/  First Contact Pager: [phone]  weekends / holidays): Second Contact Pager: [phone]   Chief Complaint: tremors   History of Present Illness: Pt is a 59 y/o male w/ PMHx of afib on coumadin, CAD, HTN, diastolic HF, obesity, and alcohol abuse who presents with tremors. Pt states he started experiencing tremors one week after he was cardioverted on 4/28 for persistent afib. Prior to cardioversion he was also started on flecainide 150mg  BID by his cardiologist Dr. [name]. Pt states that tremors are intermittent and affect all 4 extremities, he cannot tell what brings them on. Tremors last a few minutes each time and have progressively increased to the point he thought they were going to last all day today. Pt states he can tell when the tremors are about to start b/c he will feel "electrical activity" run through his body. Tonight around 11:00pm pt was laying in bed when he experience an episode of tremoring that last a few minutes however they were worse than what he has experienced this past month and he decided to come to the ED. He states that he has had light tremors the whole day and the previous day he experience around 2-3 episodes of tremos. He denies any pain during tremors, nor confusion, loss of BM or urinary incontinence, weakness, chest palpitations, chest pain, n/v. He denies hx of seizures or family hx of seizures. He drinks 3-4 shots of brown liquor daily x 1 year and has not tried to cut down recently. He has not gone through withdrawal from alcohol before. He does not smoke and denies illicit drug use. He  endorses good appetite and his weight is stable. For about 6 months pt states he gets dizzy upon standing from a seated position that starts from his head down to his feet and resolves with sitting down or after standing up for a while. Since is cardioversion his SOB has resolved. In the ED pt's creatinine was noted to be elevated at 4.56, prior creatinine on 4/21 was 1.22. He was therefore admitted for tremors and AKI.     Meds: Current Facility-Administered Medications  Medication Dose Route Frequency Provider Last Rate Last Dose  . calcium gluconate 2 g in sodium chloride 0.9 % 100 mL IVPB  2 g Intravenous Once Everlene Balls, MD      . magnesium sulfate IVPB 2 g 50 mL  2 g Intravenous Once Everlene Balls, MD       Current Outpatient Prescriptions  Medication Sig Dispense Refill  . allopurinol (ZYLOPRIM) 300 MG tablet Take 1 tablet (300 mg total) by mouth daily. 90 tablet 3  . atorvastatin (LIPITOR) 10 MG tablet Take 1 tablet (10 mg total) by mouth daily. 90 tablet 3  . esomeprazole (NEXIUM) 40 MG capsule Take 1 capsule (40 mg total) by mouth daily at 12 noon. 90 capsule 3  . fenofibrate 160 MG tablet Take 1 tablet (160 mg total) by mouth daily. 90 tablet  3  . flecainide (TAMBOCOR) 150 MG tablet Take 1 tablet (150 mg total) by mouth 2 (two) times daily. 60 tablet 3  . latanoprost (XALATAN) 0.005 % ophthalmic solution Place 1 drop into both eyes at bedtime. 7.5 mL 3  . metoprolol (LOPRESSOR) 50 MG tablet Take 1 tablet (50 mg total) by mouth 2 (two) times daily. 180 tablet 3  . nitroGLYCERIN (NITROSTAT) 0.4 MG SL tablet Place 0.4 mg under the tongue every 5 (five) minutes as needed for chest pain.    [name] oxyCODONE-acetaminophen (PERCOCET) 10-325 MG per tablet Take 1 tablet by mouth every 6 (six) hours as needed for pain. 120 tablet 0  . sorbitol 70 % solution Take 15-60 mLs by mouth daily as needed (mix in coffee, juice, or water). 473 mL 11  . terazosin (HYTRIN) 5 MG capsule Take 1 capsule (5 mg  total) by mouth at bedtime. 90 capsule 3  . torsemide (DEMADEX) 20 MG tablet Take 2 tablets (40 mg total) by mouth daily. 60 tablet 3  . valsartan (DIOVAN) 320 MG tablet Take 1 tablet (320 mg total) by mouth daily. 90 tablet 3  . warfarin (COUMADIN) 5 MG tablet Take 1/2 tablet (2.5 mg) by mouth every day except on Sundays take 1 tablet (5 mg) 30 tablet 1    Allergies: Allergies as of 02/28/2015 - Review Complete 01/20/2015  Allergen Reaction Noted  . Ramipril Swelling 09/18/2011  . Testosterone Rash 12/04/2011   Past Medical History  Diagnosis Date  . Essential hypertension 09/20/2011  . Atrial fibrillation 02/04/2012  . Chronic diastolic heart failure 05/08/7095    with mild left ventricular hypertrophy on Echo 02/2010  . Chronic pain syndrome 01/15/2013    Left arm and leg s/p traumatic injury   . Osteoarthritis cervical spine 04/25/2013  . Gastroesophageal reflux disease 04/25/2013  . Open-angle glaucoma 04/25/2013  . 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 04/25/2013  . Hyperlipidemia LDL goal < 100 04/25/2013  . Coronary artery disease 04/25/2013    25% LAD stenosis on cath 2007.  Stable angina.  . Asthma, chronic 04/25/2013    Clinical diagnosis  . Gout 04/25/2013  . Right rotator cuff tear 04/25/2013    Large full-thickness tear of the supraspinatus with mild retraction but no atrophy   . Vasomotor rhinitis 04/25/2013  . Obstructive sleep apnea 06/01/2013    Moderate, AHI 29.8 per hour with moderately loud snoring and oxygen desaturation to a nadir of 79%. CPAP titration resulted in a prescription for 17 CWP.    [name] Ulcer     History of ulcer in the remote past  . Cataract     Left eye  . Onychomycosis of toenail 12/24/2012    Left great toe   . Morbid obesity with BMI of 40.0-44.9, adult 04/25/2013  . Blood transfusion without reported diagnosis   . Chronic obstructive pulmonary disease 04/25/2013  . Diverticulosis 11/12/2013  . Osteoarthritis of left knee  06/19/2013    Tricompartmental disease.  Treated with double hinged upright knee brace, steroid/xylocaine knee injections, and NSAIDs   . CHF (congestive heart failure)   . Shortness of breath   . Chronic renal insufficiency 07/12/2014   Past Surgical History  Procedure Laterality Date  . Left leg surgery    . Left arm surgery    . Shoulder surgery      Right  . Fracture surgery Left 1980's    Elbow  . Cardioversion N/A 12/30/2014    Procedure: CARDIOVERSION;  Surgeon: [name], MD;  Location: [facility] ENDOSCOPY;  Service: Cardiovascular;  Laterality: N/A;   Family History  Problem Relation Age of Onset  . Heart failure Mother   . Alzheimer's disease Father   . Healthy Sister   . Healthy Brother   . Healthy Son   . Healthy Sister   . Healthy Sister   . Healthy Sister   . Healthy Sister   . Healthy Brother   . Healthy Brother   . Healthy Brother   . Heart failure Brother   . Healthy Brother   . Osteoarthritis Brother   . Prostate cancer Brother   . Early death Brother     Manufacturing systems engineer  . Heart attack Neg Hx   . Stroke Neg Hx   . Hypertension Sister   . Hypertension Brother    History   Social History  . Marital Status: Widowed    Spouse Name: N/A  . Number of Children: N/A  . Years of Education: N/A   Occupational History  . Not on file.   Social History Main Topics  . Smoking status: Never Smoker   . Smokeless tobacco: Never Used  . Alcohol Use: 1.0 oz/week    2 drink(s) per week     Comment: Liquor.  . Drug Use: No  . Sexual Activity: Yes    Birth Control/ Protection: None   Other Topics Concern  . Not on file   Social History Narrative    Review of Systems: Pertinent items are noted in HPI.  Physical Exam: Blood pressure 105/64, pulse 46, temperature 98.7 F (37.1 C), temperature source Oral, resp. rate 15, height 5\' 11"  (1.803 m), weight 279 lb 11.2 oz (126.871 kg), SpO2 99 %. Physical Exam  Constitutional: He appears well-developed and  well-nourished. No distress.  HENT:  Mouth/Throat: Oropharynx is clear and moist. No oropharyngeal exudate.  Eyes: Conjunctivae and EOM are normal. Pupils are equal, round, and reactive to light.  Cardiovascular: An irregularly irregular rhythm present. Bradycardia present.   No murmur heard. Pulmonary/Chest: Breath sounds normal. No respiratory distress. He has no wheezes.  Abdominal: Soft. Bowel sounds are normal. He exhibits distension. There is no tenderness.  Musculoskeletal: He exhibits edema (trace).  Neurological: He is alert. No cranial nerve deficit.  asterxis b/l. During exam left leg jerking for around 1-2 minutes during this time pt was able to follow commands to lift leg up, when sitting up pt was also noted to have tremoring of upper extremities  Skin: Skin is warm and dry. No rash noted.    Lab results: Basic Metabolic Panel:  Recent Labs  02/28/15 0100  NA 136  K 5.1  CL 101  CO2 24  GLUCOSE 126*  BUN 105*  CREATININE 4.56*  CALCIUM 9.9  MG 1.3*   CBC:  Recent Labs  02/28/15 0100  WBC 6.4  HGB 11.6*  HCT 35.9*  MCV 92.1  PLT 174   Coagulation:  Recent Labs  02/28/15 0100  LABPROT 34.3*  INR 3.49*    Imaging results:  Dg Chest 2 View  02/28/2015   CLINICAL DATA:  Dizziness, tremor. History of CHF, atrial fibrillation, hypertension, COPD.  EXAM: CHEST  2 VIEW  COMPARISON:  Chest radiograph September 26, 2014  FINDINGS: Moderate cardiomegaly, stable from prior imaging. Mediastinal silhouette is unremarkable. Central pulmonary vascular congestion and mild interstitial prominence is similar to prior examination without pleural effusion or focal consolidation. No pneumothorax. Large body habitus. Osseous structures are nonsuspicious.  IMPRESSION: Stable cardiomegaly and pulmonary vascular congestion/ mild interstitial edema.   Electronically Signed   By: Elon Alas M.D.   On: 02/28/2015 01:31   UA--neg for hgb, ketones, LE, nitrites, and protein.  100 glucose  EKG Interpretation  Date/Time:  Monday February 28 2015 00:25:18 EDT Ventricular Rate:  53 PR Interval:  218 QRS Duration: 130 QT Interval:  496 QTC Calculation: 465 R Axis:   27 Text Interpretation:  Atrial flutter new TWI V3 Reconfirmed by Glynn Octave [phone]) on 02/28/2015 12:30:36 AM}.  Assessment & Plan by Problem: Active Problems:   AKI (acute kidney injury)   Tremors-- tremors is one of the SE of flecainide which the patient was recent started on prior to [place] on 4/28.  Adverse effects of flecainide that pt has are dizziness (19% to 30%) and tremors (5%). Denies visual disturbances, SOB, palpitations, chest pain, LE swelling, ataxia, rash, nausea, abd pain, diarrhea,  And weakness. BUN 105 on admission and pt had asterxis on PE which could be contributing to tremors. Pt has long hx of EtOH abuse, he states he has been drinking 3-4 shots of liquor daily and has not recently cut down and tremors have been present x 1 month thus unlikely pt having acute withdrawal. Hyperthyroidism is another possibility, however he denies chest palpitations and hot/cold intolerance. Could also be a focal seizure without impairment of consciousness however tremors are located in all extremities and a generalized seizure would impair consciousness. Hypomagnesiumia with mag <0.8 can cause tremors, however pt's Mag 1.3 on admission. - admit to tele - TSH - ethanol level - BMET - CK, prolactin - check flecainide level for toxicity  AKI-- creatinine 4.56, b/l around 1.2. Pt on valasartan and torsemide 40mg  daily. Pt was previously on lasix  80mg  daily which was increased to torsemide on 4/21 due to his rt heart failure. Pt states he has had good po intake. PCP note states dry weight is 304 lbs on 08/2014 and pt weighs 279. Possible that pt is overdiuresis as he had dizziness and BP have been on the low side of normal. Also possible that muscle breakdown from tremors could be  contributing to AKI.  - hold diuretic and ARB - NS at 100cc/hr - CK, if WNL can consider nephrology consult  - orthostatic vitals - UA - checking post void residual to ensure obstruction is not cause of AKI as he is on terazosin   Atrial fibrillation-- seen by Dr. [name] with EP on 4/21 during which he was started on flecainide 150mg  BID. He was cardioverted on 4/28 and was last noted to be in sinus bradycardia on 5/19 w/ HR 59. Per EP pt has nocturnal bradycardia w/ HRs in the 100-120 in the daytime.Currently pt is in Afib w/ HR variable in the 40-50's. - hold lopressor 50mg  BID and flecainide  - repeat EKG  Supratherapeutic INR-- INR 3.49 , goal 2-3. Denies changes in diet.  - hold coumadin - coumadin per pharmacy   HTN-- on terazosin 5mg  qhs, valsartan 320mg  qd, and lopressor 50mg  BID.  - hold home BP meds due to low normal BPs  Diastolic HF-- weight stable, weight 285 lbs in cardiology clinic, weighs 279lbs today. EF 01/2015 reveals EF of 55-60% - hold lasix 40mg  daily due to AKI  DM2--A1c 7.2 on 4/22. Not on any medications. Due for a1c next month, if elevated PCP plans to start him on metformin 500mg   Chronic pain syndrome-- cont home percocet  HLD- cont home  atorvastatin 10mg  qd  GERD-- on nexium 40mg  daily  EtOH abuse - CIWA protocol - MVI, thiamine, and folic acid  OSA-- non compliant w/ CPAP.  He is aware of high incidence of recurrent afib in setting of untreated sleep apnea  FEN - diet- HH - Mag 1.3, repleted w/ mag sulfate 2grams - NS at 100cc/ hr  DVT ppx- coumadin per pharm  Dispo: Disposition is deferred at this time, awaiting improvement of current medical problems.   The patient does have a current PCP Oval Linsey, MD) and does need an [facility] hospital follow-up appointment after discharge.  The patient does not have transportation limitations that hinder transportation to clinic appointments.  Signed: Norman Herrlich, MD 02/28/2015, 2:44 AM

## 2015-02-28 NOTE — Progress Notes (Signed)
ANTICOAGULATION CONSULT NOTE - Initial Consult  Pharmacy Consult for Warfarin  Indication: atrial fibrillation  Allergies  Allergen Reactions  . Ramipril Swelling  . Testosterone Rash    Patient Measurements: Height: 5\' 11"  (180.3 cm) Weight: 279 lb 11.2 oz (126.871 kg) IBW/kg (Calculated) : 75.3  Vital Signs: Temp: 98.7 F (37.1 C) (06/27 0033) Temp Source: Oral (06/27 0033) BP: 104/62 mmHg (06/27 0430) Pulse Rate: 52 (06/27 0430)  Labs:  Recent Labs  02/28/15 0100 02/28/15 0406  HGB 11.6*  --   HCT 35.9*  --   PLT 174  --   LABPROT 34.3*  --   INR 3.49*  --   CREATININE 4.56*  --   CKTOTAL  --  155   Estimated Creatinine Clearance: 23.4 mL/min (by C-G formula based on Cr of 4.56).  Medical History: Past Medical History  Diagnosis Date  . Essential hypertension 09/20/2011  . Atrial fibrillation 02/04/2012  . Chronic diastolic heart failure 11/03/4399    with mild left ventricular hypertrophy on Echo 02/2010  . Chronic pain syndrome 01/15/2013    Left arm and leg s/p traumatic injury   . Osteoarthritis cervical spine 04/25/2013  . Gastroesophageal reflux disease 04/25/2013  . Open-angle glaucoma 04/25/2013  . 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 04/25/2013  . Hyperlipidemia LDL goal < 100 04/25/2013  . Coronary artery disease 04/25/2013    25% LAD stenosis on cath 2007.  Stable angina.  . Asthma, chronic 04/25/2013    Clinical diagnosis  . Gout 04/25/2013  . Right rotator cuff tear 04/25/2013    Large full-thickness tear of the supraspinatus with mild retraction but no atrophy   . Vasomotor rhinitis 04/25/2013  . Obstructive sleep apnea 06/01/2013    Moderate, AHI 29.8 per hour with moderately loud snoring and oxygen desaturation to a nadir of 79%. CPAP titration resulted in a prescription for 17 CWP.    [name] Ulcer     History of ulcer in the remote past  . Cataract     Left eye  . Onychomycosis of toenail 12/24/2012    Left great toe    . Morbid obesity with BMI of 40.0-44.9, adult 04/25/2013  . Blood transfusion without reported diagnosis   . Chronic obstructive pulmonary disease 04/25/2013  . Diverticulosis 11/12/2013  . Osteoarthritis of left knee 06/19/2013    Tricompartmental disease.  Treated with double hinged upright knee brace, steroid/xylocaine knee injections, and NSAIDs   . CHF (congestive heart failure)   . Shortness of breath   . Chronic renal insufficiency 07/12/2014    Assessment: Warfarin PTA for afib, here with tremors, INR is supra-therapeutic at 3.49, other labs as above.   Goal of Therapy:  INR 2-3 Monitor platelets by anticoagulation protocol: Yes   Plan:  -Hold warfarin  -Daily PT/INR -Re-start warfarin as INR allows  Narda Bonds 02/28/2015,5:13 AM

## 2015-02-28 NOTE — ED Notes (Addendum)
Pt reports shaking/tremors all over since last cardioverted on 4/28.  States it is worse tonight.  Denies chest pain.  Denies sob or any other symptoms.

## 2015-02-28 NOTE — Plan of Care (Signed)
Problem: Phase I Progression Outcomes Goal: Voiding-avoid urinary catheter unless indicated Outcome: Completed/Met Date Met:  02/28/15 Patient voids using urinal. Education provided about [place]. Bladder scan performed finding 112 cc residual.

## 2015-02-28 NOTE — Progress Notes (Signed)
Called ED RN for report. Room ready for admit.  

## 2015-02-28 NOTE — Progress Notes (Signed)
Receiving frequent telemetry alerts for decreased heart rate <40. Patient without complaints, denies pain, denies short of breath. MD paged to make aware. Will continue to monitor. Bartholomew Crews, RN

## 2015-02-28 NOTE — Progress Notes (Addendum)
Subjective: Patient describes having "tremors" multiple times daily since he had his cardioversion. He states the tremors only last a few minutes at a time. He denies any chest pain, dyspnea, or palpitations.   Objective: Vital signs in last 24 hours: Filed Vitals:   02/28/15 0514 02/28/15 0636 02/28/15 0637 02/28/15 0840  BP: 126/47 109/58 93/59 104/59  Pulse: 51   49  Temp: 98 F (36.7 C)   97.5 F (36.4 C)  TempSrc: Oral   Oral  Resp: 18   19  Height:      Weight: 279 lb 12.8 oz (126.916 kg)     SpO2: 94%   100%   Weight change:   Intake/Output Summary (Last 24 hours) at 02/28/15 1359 Last data filed at 02/28/15 1206  Gross per 24 hour  Intake 563.33 ml  Output    110 ml  Net 453.33 ml   Physical Exam General: sitting up in bed, NAD HEENT: [place]/AT, EOMI, sclera anicteric, mucus membranes moist CV: bradycardic, no m/g/r  Pulm: CTA bilaterally, breaths non-labored  Abd: BS+, soft, non-tender Ext: warm, no edema  Neuro: alert and oriented x 3. Mild tremor noted in right hand. No asterixis on exam.   Lab Results: Basic Metabolic Panel:  Recent Labs Lab 02/28/15 0100 02/28/15 0743  NA 136  --   K 5.1  --   CL 101  --   CO2 24  --   GLUCOSE 126*  --   BUN 105*  --   CREATININE 4.56*  --   CALCIUM 9.9  --   MG 1.3*  --   PHOS  --  4.1   Liver Function Tests:  Recent Labs Lab 02/28/15 0100  AST 31  ALT 31  ALKPHOS 30*  BILITOT 0.5  PROT 7.4  ALBUMIN 3.9   CBC:  Recent Labs Lab 02/28/15 0100  WBC 6.4  HGB 11.6*  HCT 35.9*  MCV 92.1  PLT 174   Cardiac Enzymes:  Recent Labs Lab 02/28/15 0406  CKTOTAL 155   Thyroid Function Tests:  Recent Labs Lab 02/28/15 0557 02/28/15 0743  TSH 5.118*  --   FREET4  --  0.89   Coagulation:  Recent Labs Lab 02/28/15 0100  LABPROT 34.3*  INR 3.49*    Alcohol Level:  Recent Labs Lab 02/28/15 0557  ETH <5   Urinalysis:  Recent Labs Lab 02/28/15 0418  COLORURINE YELLOW  LABSPEC  1.011  PHURINE 5.0  GLUCOSEU 100*  HGBUR NEGATIVE  BILIRUBINUR NEGATIVE  KETONESUR NEGATIVE  PROTEINUR NEGATIVE  UROBILINOGEN 0.2  NITRITE NEGATIVE  LEUKOCYTESUR NEGATIVE   Studies/Results: Dg Chest 2 View  02/28/2015   CLINICAL DATA:  Dizziness, tremor. History of CHF, atrial fibrillation, hypertension, COPD.  EXAM: CHEST  2 VIEW  COMPARISON:  Chest radiograph September 26, 2014  FINDINGS: Moderate cardiomegaly, stable from prior imaging. Mediastinal silhouette is unremarkable. Central pulmonary vascular congestion and mild interstitial prominence is similar to prior examination without pleural effusion or focal consolidation. No pneumothorax. Large body habitus. Osseous structures are nonsuspicious.  IMPRESSION: Stable cardiomegaly and pulmonary vascular congestion/ mild interstitial edema.   Electronically Signed   By: Elon Alas M.D.   On: 02/28/2015 01:31   Medications: I have reviewed the patient's current medications. Scheduled Meds: . allopurinol  300 mg Oral Daily  . fenofibrate  160 mg Oral Daily  . folic acid  1 mg Oral Daily  . latanoprost  1 drop Both Eyes QHS  . multivitamin with minerals  1 tablet Oral Daily  . sodium chloride  3 mL Intravenous Q12H  . terazosin  5 mg Oral QHS  . thiamine  100 mg Oral Daily   Or  . thiamine  100 mg Intravenous Daily   Continuous Infusions: . sodium chloride 100 mL/hr at 02/28/15 0458   PRN Meds:.LORazepam **OR** LORazepam Assessment/Plan:  Secondary Myoclonus: Patient presented with hx of "tremors" starting 1 week after he was cardioverted for his AFib on 12/30/14. Most likely his tremors are actually myoclonus secondary to a metabolic cause given his AKI. His secondary myoclonus could be due to uremia vs alcohol withdrawal vs flecainide toxicity. No myoclonus was witnessed on exam this morning, only a slight tremor in his right hand. Will continue to monitor and follow up bmet and flecainide level. - CIWA - NS at 100 ml/hr -  f/u flecainide level - bmet in AM to trend Cr/BUN - Holding flecainide for now. Will likely need to restart at a lower dose.   AKI: Patient with dizziness and low BP on admission found to have a Cr 4.56 with BUN 105 on admission. After IVF, his Cr improved to 4.02 with BUN 99 this afternoon. CK normal at 155. Orthostatics positive. UA normal. He appears dry on exam and his weight is down 6 lbs. Likely he is volume depleted from medication overuse. He does not seem familiar with his diuretics. He is on Torsemide 40 mg daily but also noted to be taking Lasix as well. Will have nurse check on urine output since only 110 cc recorded for today. Also will do PVR. Will need to consider urinary obstruction if not having adequate UOP. He does have hx of BPH. No recent contrast exposure.  - Hold BP meds - Continue NS at 100 ml/hr - bmet in AM - Strict I&Os - Consider Renal US if not having good UOP  Hx Atrial Fibrillation s/p Cardioversion: Patient was started on Flecainide 150 mg BID by Dr. [name] and then underwent cardioversion on 12/30/14 with Dr. Debara Pickett. He was noted to be in sinus bradycardia on 5/19 with HR 59. Patient was in AFib on admission, but now back in sinus bradycardia with HR in 40s-50s. Cardiology was consulted and recommended to hold flecainide and BB for now. Flecainide will likely need to restarted at a lower dose given his poor renal function. - Cardiology following, appreciate recommendations - Hold Flecainide and BB - Cardiac monitoring   Normocytic Anemia: Hbg 11.6 on admission, now 10.9 this morning. MCV 92. Patient with no prior history of anemia. No evidence of acting bleeding. INR is supratherapeutic.  - CBC in AM - Hold coumadin  - Continue to monitor   HTN: BP stable in 44H-675F systolic. Patient takes Terazosin 5 mg QHS, Valsartan 320 mg daily, and Lopressor 50 gm BID.  - Hold home meds given AKI and normotension   Secondary Hypothyroidism: TSH mildly elevated at 5.118.  Free T4 normal at 0.89. Patient is not on any thyroid medication at home. He has had normal TSH levels in the past. Likely due to acute illness. - He will need repeat levels in 6-8 weeks   Diastolic HF: Last echo in May 2016 shows EF 55-60%, mild LVH. Weight 279 lbs on admission. His weight has actually been trending down overall. He was 285 lbs in May. He is likely on the dry side given his AKI. No evidence of volume overload on exam. He takes Lopressor 50 mg BID, Torsemide 40 mg daily, and Valsartan 320  mg daily at home.  - Hold home meds given AKI and normotension   Type 2 DM: Last HbA1c 7.2 on 12/24/14. Patient is diet controlled currently. Glucose 126 on admission. - Continue to monitor glucose on bmets  - Can start sensitive ISS if glucose >180  Chronic Pain Syndrome: Patient has chronic posttraumatic pain. He takes Percocet 10-325 mg Q6H PRN at home.  - Hold for now - Can give single dose if needed, careful with AKI  Hyperlipidemia: Last lipid profile in Dec 2015 shows Chol 108, Trigly 93, HDL 55, and LDL 34. Patient takes Atorvastatin 10 mg daily and Fenofibrate 160 mg daily.  - Continue Fenofibrate - Hold statin for now   GERD: Patient takes Nexium 40 mg daily at home.  - Hold for now  Alcohol Abuse: He drinks 3-4 shots of liquor daily. He denies any previous alcohol withdrawal symptoms.  - CIWA - Folic acid, thiamine, MVM  OSA: Patient non-compliant with his CPAP at home.   Diet: Heart healthy VTE PPx: Coumadin, INR supratherapeutic currently at 3.49 Dispo: Disposition is deferred at this time, awaiting improvement of current medical problems.  Anticipated discharge in approximately 1-2 day(s).   The patient does have a current PCP Oval Linsey, MD) and does need an [facility] hospital follow-up appointment after discharge.  The patient does not have transportation limitations that hinder transportation to clinic appointments.  .Services Needed at time of discharge: Y = Yes,  Blank = No PT:   OT:   RN:   Equipment:   Other:     LOS: 0 days   Juliet Rude, MD 02/28/2015, 1:59 PM

## 2015-02-28 NOTE — ED Provider Notes (Signed)
CSN: [id number]     Arrival date & time 02/28/15  0021 History  This chart was scribed for  Everlene Balls, MD by Altamease Oiler, ED Scribe. This patient was seen in room A07C/A07C and the patient's care was started at 12:44 AM.    Chief Complaint  Patient presents with  . Tremors    The history is provided by the patient. No language interpreter was used.   Juan Calderon is a 59 y.o. male with PMHx of HTN, a-fib, who presents to the Emergency Department complaining of intermittent tremors with onset in April 2016. The episodes have worsened in the last 1.5 weeks. The pt reports that when he walks he feels dizzy and then has episodes of full body trembling. No improvement with rest. After the episodes he immediately returns to normal. Pt denies biting his tongue, bladder or bowel incontinence, CP, SOB, fever, rhinorrhea, cough, vomiting, or diarrhea.   Past Medical History  Diagnosis Date  . Essential hypertension 09/20/2011  . Atrial fibrillation 02/04/2012  . Chronic diastolic heart failure 09/06/4313    with mild left ventricular hypertrophy on Echo 02/2010  . Chronic pain syndrome 01/15/2013    Left arm and leg s/p traumatic injury   . Osteoarthritis cervical spine 04/25/2013  . Gastroesophageal reflux disease 04/25/2013  . Open-angle glaucoma 04/25/2013  . 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 04/25/2013  . Hyperlipidemia LDL goal < 100 04/25/2013  . Coronary artery disease 04/25/2013    25% LAD stenosis on cath 2007.  Stable angina.  . Asthma, chronic 04/25/2013    Clinical diagnosis  . Gout 04/25/2013  . Right rotator cuff tear 04/25/2013    Large full-thickness tear of the supraspinatus with mild retraction but no atrophy   . Vasomotor rhinitis 04/25/2013  . Obstructive sleep apnea 06/01/2013    Moderate, AHI 29.8 per hour with moderately loud snoring and oxygen desaturation to a nadir of 79%. CPAP titration resulted in a prescription for 17 CWP.    [name] Ulcer      History of ulcer in the remote past  . Cataract     Left eye  . Onychomycosis of toenail 12/24/2012    Left great toe   . Morbid obesity with BMI of 40.0-44.9, adult 04/25/2013  . Blood transfusion without reported diagnosis   . Chronic obstructive pulmonary disease 04/25/2013  . Diverticulosis 11/12/2013  . Osteoarthritis of left knee 06/19/2013    Tricompartmental disease.  Treated with double hinged upright knee brace, steroid/xylocaine knee injections, and NSAIDs   . CHF (congestive heart failure)   . Shortness of breath   . Chronic renal insufficiency 07/12/2014   Past Surgical History  Procedure Laterality Date  . Left leg surgery    . Left arm surgery    . Shoulder surgery      Right  . Fracture surgery Left 1980's    Elbow  . Cardioversion N/A 12/30/2014    Procedure: CARDIOVERSION;  Surgeon: [name], MD;  Location: [facility] ENDOSCOPY;  Service: Cardiovascular;  Laterality: N/A;   Family History  Problem Relation Age of Onset  . Heart failure Mother   . Alzheimer's disease Father   . Healthy Sister   . Healthy Brother   . Healthy Son   . Healthy Sister   . Healthy Sister   . Healthy Sister   . Healthy Sister   . Healthy Brother   . Healthy Brother   . Healthy Brother   . Heart  failure Brother   . Healthy Brother   . Osteoarthritis Brother   . Prostate cancer Brother   . Early death Brother     Manufacturing systems engineer  . Heart attack Neg Hx   . Stroke Neg Hx   . Hypertension Sister   . Hypertension Brother    History  Substance Use Topics  . Smoking status: Never Smoker   . Smokeless tobacco: Never Used  . Alcohol Use: 1.0 oz/week    2 drink(s) per week     Comment: Liquor.    Review of Systems 10 Systems reviewed and all are negative for acute change except as noted in the HPI.  Allergies  Ramipril and Testosterone  Home Medications   Prior to Admission medications   Medication Sig Start Date End Date Taking? Authorizing Provider  allopurinol  (ZYLOPRIM) 300 MG tablet Take 1 tablet (300 mg total) by mouth daily. 02/11/14   Oval Linsey, MD  atorvastatin (LIPITOR) 10 MG tablet Take 1 tablet (10 mg total) by mouth daily. 05/20/14   Oval Linsey, MD  esomeprazole (NEXIUM) 40 MG capsule Take 1 capsule (40 mg total) by mouth daily at 12 noon. 02/11/14   Oval Linsey, MD  fenofibrate 160 MG tablet Take 1 tablet (160 mg total) by mouth daily. 02/11/14   Oval Linsey, MD  flecainide (TAMBOCOR) 150 MG tablet Take 1 tablet (150 mg total) by mouth 2 (two) times daily. 12/23/14   Deboraha Sprang, MD  latanoprost (XALATAN) 0.005 % ophthalmic solution Place 1 drop into both eyes at bedtime. 01/27/15   Oval Linsey, MD  metoprolol (LOPRESSOR) 50 MG tablet Take 1 tablet (50 mg total) by mouth 2 (two) times daily. 06/20/14   Brittainy Erie Noe, PA-C  nitroGLYCERIN (NITROSTAT) 0.4 MG SL tablet Place 0.4 mg under the tongue every 5 (five) minutes as needed for chest pain.    Historical Provider, MD  oxyCODONE-acetaminophen (PERCOCET) 10-325 MG per tablet Take 1 tablet by mouth every 6 (six) hours as needed for pain. 12/24/14   Oval Linsey, MD  sorbitol 70 % solution Take 15-60 mLs by mouth daily as needed (mix in coffee, juice, or water). 12/24/14   Oval Linsey, MD  terazosin (HYTRIN) 5 MG capsule Take 1 capsule (5 mg total) by mouth at bedtime. 07/07/14   Oval Linsey, MD  torsemide (DEMADEX) 20 MG tablet Take 2 tablets (40 mg total) by mouth daily. 12/23/14   Deboraha Sprang, MD  valsartan (DIOVAN) 320 MG tablet Take 1 tablet (320 mg total) by mouth daily. 05/13/14   Oval Linsey, MD  warfarin (COUMADIN) 5 MG tablet Take 1/2 tablet (2.5 mg) by mouth every day except on Sundays take 1 tablet (5 mg) 01/21/15   Oval Linsey, MD    Triage Vitals:114/63 mmHg  Pulse 55  Temp(Src) 98.7 F (37.1 C) (Oral)  Resp 16  Ht 5\' 11"  (1.803 m)  Wt 279 lb 11.2 oz (126.871 kg)  BMI 39.03 kg/m2  SpO2 95% Physical Exam  Constitutional: He is oriented to  person, place, and time. Vital signs are normal. He appears well-developed and well-nourished.  Non-toxic appearance. He does not appear ill. No distress.  HENT:  Head: Normocephalic and atraumatic.  Nose: Nose normal.  Mouth/Throat: Oropharynx is clear and moist. No oropharyngeal exudate.  Eyes: Conjunctivae and EOM are normal. Pupils are equal, round, and reactive to light. No scleral icterus.  Neck: Normal range of motion. Neck supple. No tracheal deviation, no edema, no erythema and normal  range of motion present. No thyroid mass and no thyromegaly present.  Cardiovascular: Normal rate, regular rhythm, S1 normal, S2 normal, normal heart sounds, intact distal pulses and normal pulses.  Exam reveals no gallop and no friction rub.   No murmur heard. Pulses:      Radial pulses are 2+ on the right side, and 2+ on the left side.       Dorsalis pedis pulses are 2+ on the right side, and 2+ on the left side.  Pulmonary/Chest: Effort normal and breath sounds normal. No respiratory distress. He has no wheezes. He has no rhonchi. He has no rales.  Abdominal: Soft. Normal appearance and bowel sounds are normal. He exhibits no distension, no ascites and no mass. There is no hepatosplenomegaly. There is no tenderness. There is no rebound, no guarding and no CVA tenderness.  Musculoskeletal: Normal range of motion. He exhibits edema. He exhibits no tenderness.  LLE post surgical changes and scaring  Lymphadenopathy:    He has no cervical adenopathy.  Neurological: He is alert and oriented to person, place, and time. He has normal strength. No cranial nerve deficit or sensory deficit.  Skin: Skin is warm, dry and intact. No petechiae and no rash noted. He is not diaphoretic. No erythema. No pallor.  Psychiatric: He has a normal mood and affect. His behavior is normal. Judgment normal.  Nursing note and vitals reviewed.   ED Course  Procedures  DIAGNOSTIC STUDIES: Oxygen Saturation is 95% on RA, normal  by my interpretation.    COORDINATION OF CARE: 1:05 AM Discussed treatment plan which includes CXR, EKG, and lab work with pt at bedside and pt agreed to plan.  2:42 AM I discussed the case with the Internal Medicine teaching service.   Labs Reviewed  CBC - Abnormal; Notable for the following:    RBC 3.90 (*)    Hemoglobin 11.6 (*)    HCT 35.9 (*)    All other components within normal limits  BASIC METABOLIC PANEL - Abnormal; Notable for the following:    Glucose, Bld 126 (*)    BUN 105 (*)    Creatinine, Ser 4.56 (*)    GFR calc non Af Amer 13 (*)    GFR calc Af Amer 15 (*)    All other components within normal limits  PROTIME-INR - Abnormal; Notable for the following:    Prothrombin Time 34.3 (*)    INR 3.49 (*)    All other components within normal limits  MAGNESIUM - Abnormal; Notable for the following:    Magnesium 1.3 (*)    All other components within normal limits  URINALYSIS, ROUTINE W REFLEX MICROSCOPIC (NOT AT [facility])  Randolm Idol, ED    Imaging Review Dg Chest 2 View  02/28/2015   CLINICAL DATA:  Dizziness, tremor. History of CHF, atrial fibrillation, hypertension, COPD.  EXAM: CHEST  2 VIEW  COMPARISON:  Chest radiograph September 26, 2014  FINDINGS: Moderate cardiomegaly, stable from prior imaging. Mediastinal silhouette is unremarkable. Central pulmonary vascular congestion and mild interstitial prominence is similar to prior examination without pleural effusion or focal consolidation. No pneumothorax. Large body habitus. Osseous structures are nonsuspicious.  IMPRESSION: Stable cardiomegaly and pulmonary vascular congestion/ mild interstitial edema.   Electronically Signed   By: Elon Alas M.D.   On: 02/28/2015 01:31     EKG Interpretation   Date/Time:  Monday February 28 2015 00:25:18 EDT Ventricular Rate:  53 PR Interval:  218 QRS Duration: 130 QT Interval:  496 QTC Calculation: 465 R Axis:   27 Text Interpretation:  Atrial flutter new TWI V3  Reconfirmed by Glynn Octave [phone]) on 02/28/2015 12:30:36 AM      MDM   Final diagnoses:  Palpitations    Patient presents to the emergency department for shaking and tremors. He is denying any other associated symptoms. Laboratory studies here reveal a new a chaotic creatinine over 4. He has had a supratherapeutic INR to 3.5. Magnesium was low 1.3. Potassium is 5.1. Patient continues to be bradycardic to the 40s and 50s, he was given IV fluids, calcium borderline hyperkalemia.  Patient will require admission for continued management. Urinalysis and postvoid residual is currently pending.  Patient states his only recent medication change was decreasing his Lasix from 80 mg to 40 mg.   He was admitted to int med teaching service for continued management.  I personally performed the services described in this documentation, which was scribed in my presence. The recorded information has been reviewed and is accurate.   Everlene Balls, MD 02/28/15 1402

## 2015-02-28 NOTE — ED Notes (Signed)
Admitting residents at bedside 

## 2015-02-28 NOTE — Progress Notes (Signed)
Utilization Review Completed.Juan Calderon T6/27/2016  

## 2015-03-01 DIAGNOSIS — R791 Abnormal coagulation profile: Secondary | ICD-10-CM | POA: Insufficient documentation

## 2015-03-01 DIAGNOSIS — I480 Paroxysmal atrial fibrillation: Secondary | ICD-10-CM | POA: Insufficient documentation

## 2015-03-01 DIAGNOSIS — E038 Other specified hypothyroidism: Secondary | ICD-10-CM

## 2015-03-01 DIAGNOSIS — N179 Acute kidney failure, unspecified: Principal | ICD-10-CM

## 2015-03-01 DIAGNOSIS — E649 Sequelae of unspecified nutritional deficiency: Secondary | ICD-10-CM

## 2015-03-01 LAB — CBC
HCT: 34.6 % — ABNORMAL LOW (ref 39.0–52.0)
HEMOGLOBIN: 11.2 g/dL — AB (ref 13.0–17.0)
MCH: 29.7 pg (ref 26.0–34.0)
MCHC: 32.4 g/dL (ref 30.0–36.0)
MCV: 91.8 fL (ref 78.0–100.0)
PLATELETS: 155 10*3/uL (ref 150–400)
RBC: 3.77 MIL/uL — AB (ref 4.22–5.81)
RDW: 15 % (ref 11.5–15.5)
WBC: 5.1 10*3/uL (ref 4.0–10.5)

## 2015-03-01 LAB — RENAL FUNCTION PANEL
Albumin: 3.6 g/dL (ref 3.5–5.0)
Anion gap: 6 (ref 5–15)
BUN: 80 mg/dL — AB (ref 6–20)
CHLORIDE: 105 mmol/L (ref 101–111)
CO2: 27 mmol/L (ref 22–32)
Calcium: 9.7 mg/dL (ref 8.9–10.3)
Creatinine, Ser: 2.77 mg/dL — ABNORMAL HIGH (ref 0.61–1.24)
GFR calc non Af Amer: 23 mL/min — ABNORMAL LOW (ref >60)
GFR, EST AFRICAN AMERICAN: 27 mL/min — AB (ref >60)
GLUCOSE: 102 mg/dL — AB (ref 65–99)
PHOSPHORUS: 3.5 mg/dL (ref 2.5–4.6)
Potassium: 5.1 mmol/L (ref 3.5–5.1)
Sodium: 138 mmol/L (ref 135–145)

## 2015-03-01 LAB — PROTIME-INR
INR: 3.72 — ABNORMAL HIGH (ref 0.00–1.49)
Prothrombin Time: 36 seconds — ABNORMAL HIGH (ref 11.6–15.2)

## 2015-03-01 LAB — PROLACTIN: Prolactin: 28.1 ng/mL — ABNORMAL HIGH (ref 4.0–15.2)

## 2015-03-01 MED ORDER — METOPROLOL TARTRATE 25 MG PO TABS
25.0000 mg | ORAL_TABLET | Freq: Two times a day (BID) | ORAL | Status: DC
  Administered 2015-03-01 – 2015-03-03 (×5): 25 mg via ORAL
  Filled 2015-03-01 (×8): qty 1

## 2015-03-01 MED ORDER — WARFARIN - PHARMACIST DOSING INPATIENT
Freq: Every day | Status: DC
  Administered 2015-03-02: 20:00:00

## 2015-03-01 MED ORDER — SODIUM CHLORIDE 0.9 % IV SOLN
INTRAVENOUS | Status: AC
  Administered 2015-03-01: 17:00:00 via INTRAVENOUS

## 2015-03-01 NOTE — Progress Notes (Signed)
Patient ID: Juan Calderon, male   DOB: 05-17-1956, 59 y.o.   MRN: [id number] Medicine attending: I examined this patient this morning together with resident physician Dr. Albin Felling and I concur with her evaluation and management plan. Renal function is slowly improving as anticipated with hydration with fall in BUN from 105 to 80 and creatinine from 4.6 to 2.8. Myoclonic jerking has subsided. We will continue to hold diuretics and continue hydration. Patient was questioned about his diuretic use. He states that he did discontinue the furosemide but was taking the Demadex 40 mg daily since his cardioversion. He has reverted back into normal sinus rhythm without any pharmacologic intervention. We are still holding his Flecainide pending blood levels.

## 2015-03-01 NOTE — Progress Notes (Signed)
Subjective: No acute events overnight. Patient states his tremors are occurring less often. He denies chest pain, dyspnea, and palpitations. He reports he is urinating well.   HR has remained in the 50s-70s. Renal ultrasound without obstruction, shows chronic medical disease.   Objective: Vital signs in last 24 hours: Filed Vitals:   02/28/15 1655 02/28/15 2042 03/01/15 0517 03/01/15 0814  BP:  112/66 107/53 114/70  Pulse:  56 61 70  Temp: 97.6 F (36.4 C) 97.8 F (36.6 C) 98.5 F (36.9 C) 98.1 F (36.7 C)  TempSrc: Oral Oral Oral Oral  Resp:  20 18 18   Height:      Weight:      SpO2:  97% 97% 95%   Weight change:   Intake/Output Summary (Last 24 hours) at 03/01/15 0817 Last data filed at 03/01/15 0600  Gross per 24 hour  Intake 2733.75 ml  Output    875 ml  Net 1858.75 ml   Physical Exam General: resting in bed, NAD HEENT: [place]/AT, EOMI, sclera anicteric, mucus membranes moist  CV: RRR, no m/g/r Pulm: CTA bilaterally, breaths non-labored Abd: BS+, soft, obese, non-tender  Ext: warm, no edema Neuro: alert and oriented x 3. No tremors noted on exam.   Lab Results: Basic Metabolic Panel:  Recent Labs Lab 02/28/15 0100 02/28/15 0743 02/28/15 1340  NA 136  --  137  K 5.1  --  4.9  CL 101  --  101  CO2 24  --  25  GLUCOSE 126*  --  148*  BUN 105*  --  99*  CREATININE 4.56*  --  4.02*  CALCIUM 9.9  --  9.8  MG 1.3*  --  1.6*  PHOS  --  4.1  --    Liver Function Tests:  Recent Labs Lab 02/28/15 0100  AST 31  ALT 31  ALKPHOS 30*  BILITOT 0.5  PROT 7.4  ALBUMIN 3.9   CBC:  Recent Labs Lab 02/28/15 1340 03/01/15 0630  WBC 5.1 5.1  HGB 10.9* 11.2*  HCT 33.5* 34.6*  MCV 91.5 91.8  PLT 163 155   Cardiac Enzymes:  Recent Labs Lab 02/28/15 0406  CKTOTAL 155   CBG:  Recent Labs Lab 02/28/15 2209  GLUCAP 103*   Thyroid Function Tests:  Recent Labs Lab 02/28/15 0557 02/28/15 0743  TSH 5.118*  --   FREET4  --  0.89    Coagulation:  Recent Labs Lab 02/28/15 0100 03/01/15 0630  LABPROT 34.3* 36.0*  INR 3.49* 3.72*   Alcohol Level:  Recent Labs Lab 02/28/15 0557  ETH <5   Urinalysis:  Recent Labs Lab 02/28/15 0418  COLORURINE YELLOW  LABSPEC 1.011  PHURINE 5.0  GLUCOSEU 100*  HGBUR NEGATIVE  BILIRUBINUR NEGATIVE  KETONESUR NEGATIVE  PROTEINUR NEGATIVE  UROBILINOGEN 0.2  NITRITE NEGATIVE  LEUKOCYTESUR NEGATIVE   Studies/Results: Dg Chest 2 View  02/28/2015   CLINICAL DATA:  Dizziness, tremor. History of CHF, atrial fibrillation, hypertension, COPD.  EXAM: CHEST  2 VIEW  COMPARISON:  Chest radiograph September 26, 2014  FINDINGS: Moderate cardiomegaly, stable from prior imaging. Mediastinal silhouette is unremarkable. Central pulmonary vascular congestion and mild interstitial prominence is similar to prior examination without pleural effusion or focal consolidation. No pneumothorax. Large body habitus. Osseous structures are nonsuspicious.  IMPRESSION: Stable cardiomegaly and pulmonary vascular congestion/ mild interstitial edema.   Electronically Signed   By: Elon Alas M.D.   On: 02/28/2015 01:31   US Renal  02/28/2015   CLINICAL DATA:  Acute kidney injury.  EXAM: RENAL / URINARY TRACT ULTRASOUND COMPLETE  COMPARISON:  None.  FINDINGS: Right Kidney:  Length: 12.7. Echogenicity appears increased. No mass or hydronephrosis visualized.  Left Kidney:  Length: 12.3. Echogenicity appears increased. No hydronephrosis visualized. A simple lower pole cyst measures 4.0 cm in diameter.  Bladder:  Appears normal for degree of bladder distention.  IMPRESSION: Negative for hydronephrosis or acute abnormality.  Increased cortical echogenicity compatible with medical renal disease.   Electronically Signed   By: Inge Rise M.D.   On: 02/28/2015 21:42   Medications: I have reviewed the patient's current medications. Scheduled Meds: . folic acid  1 mg Oral Daily  . latanoprost  1 drop Both  Eyes QHS  . multivitamin with minerals  1 tablet Oral Daily  . sodium chloride  3 mL Intravenous Q12H  . terazosin  5 mg Oral QHS  . thiamine  100 mg Oral Daily   Or  . thiamine  100 mg Intravenous Daily   Continuous Infusions: . sodium chloride     PRN Meds:.LORazepam **OR** LORazepam Assessment/Plan:  Secondary Myoclonus: Myoclonus improved this morning. Likely secondary to uremia. Cr 2.77 and BUN 80, improving. There is evidence to support patients experiencing tremors s/p cardioversion but his "tremors" seem to be more myoclonic jerking. A metabolic cause seems more likely in his case.  - CIWA - Continue NS at 100 ml/hr - f/u flecainide level - Holding flecainide for now. Will likely need to restart at a lower dose.   AKI: Cr much improved this morning to 2.77, BUN 80. Renal US with no obstruction, evidence of medical renal disease. Patient states he is urinating fine. UOP only 875 cc yesterday, erroneous? Continue IVFs. Likely he was on too high of a dose of torsemide at home and became volume depleted. He denied taking Lasix in addition to torsemide.  - Hold torsemide and ARB - Restart Metoprolol 25 mg BID per cards - Continue NS at 100 ml/hr - bmet in AM - Strict I&Os  Hx Atrial Fibrillation s/p Cardioversion: In NSR this AM, HR in 60s-70s. Cards restarted his metoprolol this AM. Will likely need to restart flecainide at a lower dose given his renal function.  - Cardiology following, appreciate recommendations - Hold Flecainide, possibly restart tomorrow - Restart metoprolol 25 mg BID - Cardiac monitoring  - f/u Flecainide level  Normocytic Anemia: Hbg 11.6 on admission, now 11.2 this morning. MCV 92. Patient with no prior history of anemia. No evidence of acting bleeding. INR is supratherapeutic.  - Hold coumadin  - Continue to monitor  - PT/INR in AM  HTN: BP stable in 735H-299M systolic. Patient takes Terazosin 5 mg QHS, Valsartan 320 mg daily, and Lopressor 50 gm  BID.  - Continue Terazosin 5 mg QHS- no hx of BPH in records. Would d/c if being used for BP control and use other agents once BP more stabilized. - Restart Metoprolol 25 mg BID - Hold ARB and torsemide   Secondary Hypothyroidism: TSH mildly elevated at 5.118. Free T4 normal at 0.89. Patient is not on any thyroid medication at home. He has had normal TSH levels in the past. Likely due to acute illness. His prolactin level was mildly elevated at 28 which can be secondary to hypothyroidism.  - He will need repeat levels in 6-8 weeks   Diastolic HF: Last echo in May 2016 shows EF 55-60%, mild LVH. Weight 279 lbs on admission. His weight has actually been trending down overall. He  was 285 lbs in May. He is likely on the dry side given his AKI. No evidence of volume overload on exam. He takes Lopressor 50 mg BID, Torsemide 40 mg daily, and Valsartan 320 mg daily at home.  - Restart Metoprolol 25 mg BID - Hold ARB and torsemide   Type 2 DM: Last HbA1c 7.2 on 12/24/14. Patient is diet controlled currently. Glucose 103 yesterday. - Continue to monitor glucose on bmets  - Can start sensitive ISS if glucose >180  Chronic Pain Syndrome: Patient has chronic posttraumatic pain. He takes Percocet 10-325 mg Q6H PRN at home.  - Hold for now - Can give single dose if needed, careful with AKI  Hyperlipidemia: Last lipid profile in Dec 2015 shows Chol 108, Trigly 93, HDL 55, and LDL 34. Patient takes Atorvastatin 10 mg daily and Fenofibrate 160 mg daily.  - Continue Fenofibrate - Hold statin for now   GERD: Patient takes Nexium 40 mg daily at home.  - Hold for now  Alcohol Abuse: He drinks 3-4 shots of liquor daily. He denies any previous alcohol withdrawal symptoms.  - CIWA - Folic acid, thiamine, MVM  OSA: Patient non-compliant with his CPAP at home.   Diet: Heart healthy VTE PPx: Coumadin, INR still supratherapeutic  Dispo: Disposition is deferred at this time, awaiting improvement of  current medical problems.  Anticipated discharge in approximately 1-2 day(s).   The patient does have a current PCP Oval Linsey, MD) and does need an [facility] follow-up appointment after discharge.  The patient does not have transportation limitations that hinder transportation to clinic appointments.  .Services Needed at time of discharge: Y = Yes, Blank = No PT:   OT:   RN:   Equipment:   Other:     LOS: 1 day   Juliet Rude, MD 03/01/2015, 8:17 AM

## 2015-03-01 NOTE — Progress Notes (Signed)
[place] for Warfarin  Indication: atrial fibrillation  Allergies  Allergen Reactions  . Ramipril Swelling  . Testosterone Rash    Patient Measurements: Height: 5\' 11"  (180.3 cm) Weight: 279 lb 12.8 oz (126.916 kg) IBW/kg (Calculated) : 75.3  Vital Signs: Temp: 98.1 F (36.7 C) (06/28 0814) Temp Source: Oral (06/28 0814) BP: 114/70 mmHg (06/28 0814) Pulse Rate: 70 (06/28 0814)  Labs:  Recent Labs  02/28/15 0100 02/28/15 0406 02/28/15 1340 03/01/15 0630  HGB 11.6*  --  10.9* 11.2*  HCT 35.9*  --  33.5* 34.6*  PLT 174  --  163 155  LABPROT 34.3*  --   --  36.0*  INR 3.49*  --   --  3.72*  CREATININE 4.56*  --  4.02* 2.77*  CKTOTAL  --  155  --   --    Estimated Creatinine Clearance: 38.5 mL/min (by C-G formula based on Cr of 2.77).  Medical History: Past Medical History  Diagnosis Date  . Essential hypertension 09/20/2011  . Atrial fibrillation 02/04/2012  . Chronic diastolic heart failure 0/0/1749    with mild left ventricular hypertrophy on Echo 02/2010  . Chronic pain syndrome 01/15/2013    Left arm and leg s/p traumatic injury   . Osteoarthritis cervical spine 04/25/2013  . Gastroesophageal reflux disease 04/25/2013  . Open-angle glaucoma 04/25/2013  . 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 04/25/2013  . Hyperlipidemia LDL goal < 100 04/25/2013  . Coronary artery disease 04/25/2013    25% LAD stenosis on cath 2007.  Stable angina.  . Asthma, chronic 04/25/2013    Clinical diagnosis  . Gout 04/25/2013  . Right rotator cuff tear 04/25/2013    Large full-thickness tear of the supraspinatus with mild retraction but no atrophy   . Vasomotor rhinitis 04/25/2013  . Obstructive sleep apnea 06/01/2013    Moderate, AHI 29.8 per hour with moderately loud snoring and oxygen desaturation to a nadir of 79%. CPAP titration resulted in a prescription for 17 CWP.    [name] Ulcer     History of ulcer in the remote past   . Cataract     Left eye  . Onychomycosis of toenail 12/24/2012    Left great toe   . Morbid obesity with BMI of 40.0-44.9, adult 04/25/2013  . Blood transfusion without reported diagnosis   . Chronic obstructive pulmonary disease 04/25/2013  . Diverticulosis 11/12/2013  . Osteoarthritis of left knee 06/19/2013    Tricompartmental disease.  Treated with double hinged upright knee brace, steroid/xylocaine knee injections, and NSAIDs   . CHF (congestive heart failure)   . Shortness of breath   . Chronic renal insufficiency 07/12/2014  . Alcohol abuse 02/28/2015    Assessment: Warfarin PTA for afib, here with tremors, INR is supratherapeutic, up to 3.7 today. No s/sx bleeding, CBC stable.      Goal of Therapy:  INR 2-3 Monitor platelets by anticoagulation protocol: Yes   Plan:  -Hold warfarin  -Daily PT/INR -Re-start warfarin as INR allows  Lovel Suazo, Jake Church 03/01/2015,8:56 AM
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
## 2015-03-02 DIAGNOSIS — N19 Unspecified kidney failure: Secondary | ICD-10-CM

## 2015-03-02 DIAGNOSIS — G253 Myoclonus: Secondary | ICD-10-CM

## 2015-03-02 LAB — BASIC METABOLIC PANEL
Anion gap: 10 (ref 5–15)
BUN: 53 mg/dL — AB (ref 6–20)
CO2: 26 mmol/L (ref 22–32)
CREATININE: 1.99 mg/dL — AB (ref 0.61–1.24)
Calcium: 9.9 mg/dL (ref 8.9–10.3)
Chloride: 102 mmol/L (ref 101–111)
GFR calc Af Amer: 40 mL/min — ABNORMAL LOW (ref >60)
GFR calc non Af Amer: 35 mL/min — ABNORMAL LOW (ref >60)
GLUCOSE: 98 mg/dL (ref 65–99)
Potassium: 5.2 mmol/L — ABNORMAL HIGH (ref 3.5–5.1)
Sodium: 138 mmol/L (ref 135–145)

## 2015-03-02 LAB — PROTIME-INR
INR: 2.96 — AB (ref 0.00–1.49)
Prothrombin Time: 30.3 seconds — ABNORMAL HIGH (ref 11.6–15.2)

## 2015-03-02 MED ORDER — OXYCODONE-ACETAMINOPHEN 5-325 MG PO TABS
1.0000 | ORAL_TABLET | Freq: Four times a day (QID) | ORAL | Status: DC | PRN
  Administered 2015-03-02: 1 via ORAL
  Filled 2015-03-02: qty 1

## 2015-03-02 MED ORDER — FLECAINIDE ACETATE 50 MG PO TABS
50.0000 mg | ORAL_TABLET | Freq: Two times a day (BID) | ORAL | Status: DC
  Administered 2015-03-02 – 2015-03-03 (×3): 50 mg via ORAL
  Filled 2015-03-02 (×5): qty 1

## 2015-03-02 MED ORDER — OXYCODONE-ACETAMINOPHEN 5-325 MG PO TABS
1.0000 | ORAL_TABLET | Freq: Once | ORAL | Status: AC
  Administered 2015-03-02: 1 via ORAL
  Filled 2015-03-02: qty 1

## 2015-03-02 MED ORDER — WARFARIN SODIUM 2.5 MG PO TABS
2.5000 mg | ORAL_TABLET | Freq: Once | ORAL | Status: AC
  Administered 2015-03-02: 2.5 mg via ORAL
  Filled 2015-03-02: qty 1

## 2015-03-02 MED ORDER — SODIUM CHLORIDE 0.9 % IV SOLN
INTRAVENOUS | Status: AC
  Administered 2015-03-02: 14:00:00 via INTRAVENOUS

## 2015-03-02 NOTE — Care Management (Signed)
Important Message  Patient Details  Name: DONTARIOUS SCHAUM MRN: [id number] Date of Birth: 22-Feb-1956   Medicare Important Message Given:  Yes-second notification given    Louanne Belton 03/02/2015, 3:01 PM

## 2015-03-02 NOTE — Plan of Care (Signed)
Problem: Phase II Progression Outcomes Goal: Discharge plan established Outcome: Completed/Met Date Met:  03/02/15 Home alone. Sister lives across the street.

## 2015-03-02 NOTE — Progress Notes (Signed)
Patient c/o L knee pain 7/10. Reports pain is chronic. Usually takes 1 percocet when the pain occurs. Telephone call to MD. Order received. Will continue to monitor. Bartholomew Crews, RN
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
## 2015-03-02 NOTE — Progress Notes (Signed)
Patient ID: Juan Calderon, male   DOB: 1956-08-06, 59 y.o.   MRN: [id number] Medicine attending: Patient examined together with resident physician Dr. Harold Barban and I concur with her evaluation and management plan. Renal function continues to improve. Nonspecific uremic myoclonic jerking has resolved. He remains in sinus rhythm. We appreciate cardiology input. We will resume flecainide at a lower dose. Anticipate discharge tomorrow.

## 2015-03-02 NOTE — Progress Notes (Signed)
Subjective: No acute events overnight. Patient's Cr continues to improve after IVFs. Myoclonus has resolved. Cardiology has restarted his Flecainide at a lower dose. He remains in NSR.   Objective: Vital signs in last 24 hours: Filed Vitals:   03/01/15 2100 03/02/15 0500 03/02/15 0926 03/02/15 1000  BP: 106/68 108/70  115/73  Pulse: 64 68 71 65  Temp: 98 F (36.7 C) 98.1 F (36.7 C)  97.6 F (36.4 C)  TempSrc: Oral Oral  Oral  Resp: 18 16  18   Height:      Weight: 282 lb (127.914 kg)     SpO2: 99% 95%  96%   Weight change:   Intake/Output Summary (Last 24 hours) at 03/02/15 1343 Last data filed at 03/02/15 0932  Gross per 24 hour  Intake   1578 ml  Output   1351 ml  Net    227 ml   Physical Exam General: sitting up in chair, NAD HEENT: [place]/AT, EOMI, sclera anicteric, mucus membranes moist CV: RRR, no m/g/r Pulm: CTA bilaterally, breaths non-labored Abd: BS+, soft, obese, non-tender Ext: warm, no edema Neuro: alert and oriented x 3  Lab Results: Basic Metabolic Panel:  Recent Labs Lab 02/28/15 0100 02/28/15 0743 02/28/15 1340 03/01/15 0630 03/02/15 0546  NA 136  --  137 138 138  K 5.1  --  4.9 5.1 5.2*  CL 101  --  101 105 102  CO2 24  --  25 27 26   GLUCOSE 126*  --  148* 102* 98  BUN 105*  --  99* 80* 53*  CREATININE 4.56*  --  4.02* 2.77* 1.99*  CALCIUM 9.9  --  9.8 9.7 9.9  MG 1.3*  --  1.6*  --   --   PHOS  --  4.1  --  3.5  --    Liver Function Tests:  Recent Labs Lab 02/28/15 0100 03/01/15 0630  AST 31  --   ALT 31  --   ALKPHOS 30*  --   BILITOT 0.5  --   PROT 7.4  --   ALBUMIN 3.9 3.6   CBC:  Recent Labs Lab 02/28/15 1340 03/01/15 0630  WBC 5.1 5.1  HGB 10.9* 11.2*  HCT 33.5* 34.6*  MCV 91.5 91.8  PLT 163 155   Cardiac Enzymes:  Recent Labs Lab 02/28/15 0406  CKTOTAL 155   CBG:  Recent Labs Lab 02/28/15 2209  GLUCAP 103*   Thyroid Function Tests:  Recent Labs Lab 02/28/15 0557 02/28/15 0743  TSH 5.118*   --   FREET4  --  0.89   Coagulation:  Recent Labs Lab 02/28/15 0100 03/01/15 0630 03/02/15 0546  LABPROT 34.3* 36.0* 30.3*  INR 3.49* 3.72* 2.96*    Alcohol Level:  Recent Labs Lab 02/28/15 0557  ETH <5   Urinalysis:  Recent Labs Lab 02/28/15 0418  COLORURINE YELLOW  LABSPEC 1.011  PHURINE 5.0  GLUCOSEU 100*  HGBUR NEGATIVE  BILIRUBINUR NEGATIVE  KETONESUR NEGATIVE  PROTEINUR NEGATIVE  UROBILINOGEN 0.2  NITRITE NEGATIVE  LEUKOCYTESUR NEGATIVE   Studies/Results: US Renal  02/28/2015   CLINICAL DATA:  Acute kidney injury.  EXAM: RENAL / URINARY TRACT ULTRASOUND COMPLETE  COMPARISON:  None.  FINDINGS: Right Kidney:  Length: 12.7. Echogenicity appears increased. No mass or hydronephrosis visualized.  Left Kidney:  Length: 12.3. Echogenicity appears increased. No hydronephrosis visualized. A simple lower pole cyst measures 4.0 cm in diameter.  Bladder:  Appears normal for degree of bladder distention.  IMPRESSION: Negative for  hydronephrosis or acute abnormality.  Increased cortical echogenicity compatible with medical renal disease.   Electronically Signed   By: Inge Rise M.D.   On: 02/28/2015 21:42   Medications: I have reviewed the patient's current medications. Scheduled Meds: . flecainide  50 mg Oral Q12H  . folic acid  1 mg Oral Daily  . latanoprost  1 drop Both Eyes QHS  . metoprolol tartrate  25 mg Oral BID  . multivitamin with minerals  1 tablet Oral Daily  . sodium chloride  3 mL Intravenous Q12H  . terazosin  5 mg Oral QHS  . thiamine  100 mg Oral Daily   Or  . thiamine  100 mg Intravenous Daily  . warfarin  2.5 mg Oral ONCE-1800  . Warfarin - Pharmacist Dosing Inpatient   Does not apply q1800   Continuous Infusions:  PRN Meds:.LORazepam **OR** LORazepam, oxyCODONE-acetaminophen Assessment/Plan:  AKI: Cr continues to improve, 1.99 this AM with BUN 53. Baseline Cr 1.2-1.3. Renal US with no obstruction, evidence of medical renal disease (likely  hypertension, diabetes). His AKI was most likely due to overdiuresis from diuretics.  - Hold torsemide and ARB - Continue Metoprolol 25 mg BID per cards - Continue NS at 75 ml/hr - bmet in AM - Strict I&Os  Hx Atrial Fibrillation s/p Cardioversion: Remains in NSR this AM, HR in 60s-70s. Cards restarted his Flecainide at 50 mg Q12H. He was previously on 150 mg BID. He has follow up with Dr.Klein in August, but cards trying to get him in earlier.  - Cardiology following, appreciate recommendations - Flecainide restarted at 50 mg Q12H - Continue Metoprolol 25 mg BID - Cardiac monitoring  - f/u Flecainide level  Secondary Myoclonus: Resolved. Likely secondary to uremia.  - CIWA - Continue NS at 100 ml/hr - f/u flecainide level  Normocytic Anemia: Hbg 11.6 on admission, stable at 11. Patient with no prior history of anemia. No evidence of acting bleeding. INR now therapeutic. Coumadin restarted today. - Restart coumadin  - Continue to monitor  - PT/INR in AM  HTN: BP stable in 614E-315Q systolic. Patient takes Terazosin 5 mg QHS, Valsartan 320 mg daily, and Lopressor 50 gm BID.  - Continue Terazosin 5 mg QHS - Continue Metoprolol 25 mg BID - Hold ARB and torsemide   Secondary Hypothyroidism: TSH mildly elevated at 5.118. Free T4 normal at 0.89. Patient is not on any thyroid medication at home. He has had normal TSH levels in the past. Likely due to acute illness. His prolactin level was mildly elevated at 28 which can be secondary to hypothyroidism.  - He will need repeat levels in 6-8 weeks   Diastolic HF: Last echo in May 2016 shows EF 55-60%, mild LVH. Weight 279 lbs on admission. His weight has actually been trending down overall. He was 285 lbs in May. Weight 282 lbs today likely due to diuretics being held and IVF administration. No evidence of volume overload on exam. He takes Lopressor 50 mg BID, Torsemide 40 mg daily, and Valsartan 320 mg daily at home.  - Continue  Metoprolol 25 mg BID - Hold ARB and torsemide, likely restart at lower dose tomorrow   Type 2 DM: Last HbA1c 7.2 on 12/24/14. Patient is diet controlled currently.  - Continue to monitor glucose on bmets  - Can start sensitive ISS if glucose >180  Chronic Pain Syndrome: Patient has chronic posttraumatic pain. He takes Percocet 10-325 mg Q6H PRN at home.  - Start Percocet 5-325 mg Q6H  PRN  Hyperlipidemia: Last lipid profile in Dec 2015 shows Chol 108, Trigly 93, HDL 55, and LDL 34. Patient takes Atorvastatin 10 mg daily and Fenofibrate 160 mg daily.  - Continue Fenofibrate - Hold statin for now   GERD: Patient takes Nexium 40 mg daily at home.  - Hold for now  Alcohol Abuse: He drinks 3-4 shots of liquor daily. He denies any previous alcohol withdrawal symptoms.  - CIWA - Folic acid, thiamine, MVM  OSA: Patient non-compliant with his CPAP at home.    Diet: Heart healthy/Carb modified  VTE PPx: Coumadin restarted today, INR therapeutic  Dispo: Discharge likely tomorrow   The patient does have a current PCP Oval Linsey, MD) and does need an [facility] hospital follow-up appointment after discharge.  The patient does not have transportation limitations that hinder transportation to clinic appointments.  .Services Needed at time of discharge: Y = Yes, Blank = No PT:   OT:   RN:   Equipment:   Other:     LOS: 2 days   Juliet Rude, MD 03/02/2015, 1:43 PM

## 2015-03-03 DIAGNOSIS — E869 Volume depletion, unspecified: Secondary | ICD-10-CM

## 2015-03-03 LAB — PROTIME-INR
INR: 2.9 — AB (ref 0.00–1.49)
Prothrombin Time: 29.9 seconds — ABNORMAL HIGH (ref 11.6–15.2)

## 2015-03-03 LAB — BASIC METABOLIC PANEL
ANION GAP: 9 (ref 5–15)
BUN: 34 mg/dL — ABNORMAL HIGH (ref 6–20)
CO2: 24 mmol/L (ref 22–32)
Calcium: 9.8 mg/dL (ref 8.9–10.3)
Chloride: 105 mmol/L (ref 101–111)
Creatinine, Ser: 1.63 mg/dL — ABNORMAL HIGH (ref 0.61–1.24)
GFR, EST AFRICAN AMERICAN: 51 mL/min — AB (ref >60)
GFR, EST NON AFRICAN AMERICAN: 44 mL/min — AB (ref >60)
GLUCOSE: 109 mg/dL — AB (ref 65–99)
POTASSIUM: 4.8 mmol/L (ref 3.5–5.1)
SODIUM: 138 mmol/L (ref 135–145)

## 2015-03-03 MED ORDER — FLECAINIDE ACETATE 50 MG PO TABS: 50.0000 mg | ORAL_TABLET | Freq: Two times a day (BID) | ORAL | Status: DC

## 2015-03-03 MED ORDER — METOPROLOL TARTRATE 25 MG PO TABS: 25.0000 mg | ORAL_TABLET | Freq: Two times a day (BID) | ORAL | Status: DC

## 2015-03-03 NOTE — Progress Notes (Signed)
[place] for Warfarin  Indication: atrial fibrillation  Allergies  Allergen Reactions  . Ramipril Swelling  . Testosterone Rash    Patient Measurements: Height: 5\' 11"  (180.3 cm) Weight: 282 lb (127.914 kg) IBW/kg (Calculated) : 75.3  Vital Signs: Temp: 98 F (36.7 C) (06/30 1000) Temp Source: Oral (06/30 1000) BP: 108/59 mmHg (06/30 1000) Pulse Rate: 62 (06/30 1000)  Labs:  Recent Labs  02/28/15 1340 03/01/15 0630 03/02/15 0546 03/03/15 0512 03/03/15 0810  HGB 10.9* 11.2*  --   --   --   HCT 33.5* 34.6*  --   --   --   PLT 163 155  --   --   --   LABPROT  --  36.0* 30.3* 29.9*  --   INR  --  3.72* 2.96* 2.90*  --   CREATININE 4.02* 2.77* 1.99*  --  1.63*   Estimated Creatinine Clearance: 65.6 mL/min (by C-G formula based on Cr of 1.63).  Medical History: Past Medical History  Diagnosis Date  . Essential hypertension 09/20/2011  . Atrial fibrillation 02/04/2012  . Chronic diastolic heart failure 10/10/8339    with mild left ventricular hypertrophy on Echo 02/2010  . Chronic pain syndrome 01/15/2013    Left arm and leg s/p traumatic injury   . Osteoarthritis cervical spine 04/25/2013  . Gastroesophageal reflux disease 04/25/2013  . Open-angle glaucoma 04/25/2013  . 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 04/25/2013  . Hyperlipidemia LDL goal < 100 04/25/2013  . Coronary artery disease 04/25/2013    25% LAD stenosis on cath 2007.  Stable angina.  . Asthma, chronic 04/25/2013    Clinical diagnosis  . Gout 04/25/2013  . Right rotator cuff tear 04/25/2013    Large full-thickness tear of the supraspinatus with mild retraction but no atrophy   . Vasomotor rhinitis 04/25/2013  . Obstructive sleep apnea 06/01/2013    Moderate, AHI 29.8 per hour with moderately loud snoring and oxygen desaturation to a nadir of 79%. CPAP titration resulted in a prescription for 17 CWP.    [name] Ulcer     History of ulcer in the  remote past  . Cataract     Left eye  . Onychomycosis of toenail 12/24/2012    Left great toe   . Morbid obesity with BMI of 40.0-44.9, adult 04/25/2013  . Blood transfusion without reported diagnosis   . Chronic obstructive pulmonary disease 04/25/2013  . Diverticulosis 11/12/2013  . Osteoarthritis of left knee 06/19/2013    Tricompartmental disease.  Treated with double hinged upright knee brace, steroid/xylocaine knee injections, and NSAIDs   . CHF (congestive heart failure)   . Shortness of breath   . Chronic renal insufficiency 07/12/2014  . Alcohol abuse 02/28/2015    Assessment: Warfarin PTA for afib, here with tremors, INR therapeutic, 2.9. No s/sx bleeding, CBC stable. Warfarin was held for a couple days with supratherapeutic INR and was resumed yesterday when INR was back in therapeutic range.   PTA warfarin: 5 mg qSun, 2.5 mg all other days   Goal of Therapy:  INR 2-3 Monitor platelets by anticoagulation protocol: Yes   Plan:  -warfarin 2.5 mg po x1 -daily INR -monitor for s/sx bleeding   Harvel Quale 03/03/2015,11:02 AM

## 2015-03-03 NOTE — Progress Notes (Signed)
Subjective: No acute events overnight. Patient's Cr has continued to trend down, 1.63 today. He remains in NSR on flecainide.   Objective: Vital signs in last 24 hours: Filed Vitals:   03/02/15 1757 03/02/15 2204 03/03/15 0501 03/03/15 1000  BP: 113/73 112/70 113/73 108/59  Pulse: 55 58 61 62  Temp: 97.7 F (36.5 C) 98 F (36.7 C) 97.9 F (36.6 C) 98 F (36.7 C)  TempSrc: Oral Oral Oral Oral  Resp: 18 20 18 18   Height:      Weight:  282 lb (127.914 kg)    SpO2: 98% 91% 95% 96%   Weight change: 0 lb (0 kg)  Intake/Output Summary (Last 24 hours) at 03/03/15 1044 Last data filed at 03/03/15 0900  Gross per 24 hour  Intake 1705.75 ml  Output   2940 ml  Net -1234.25 ml   Physical Exam General: sitting up in chair, NAD HEENT: [place]/AT, EOMI, sclera anicteric, mucus membranes moist CV: RRR, no m/g/r Pulm: CTA bilaterally, breaths non-labored Abd: BS+, soft, obese, non-tender Ext: warm, no edema Neuro: alert and oriented x 3, no focal deficits  Lab Results: Basic Metabolic Panel:  Recent Labs Lab 02/28/15 0100 02/28/15 0743 02/28/15 1340 03/01/15 0630 03/02/15 0546 03/03/15 0810  NA 136  --  137 138 138 138  K 5.1  --  4.9 5.1 5.2* 4.8  CL 101  --  101 105 102 105  CO2 24  --  25 27 26 24   GLUCOSE 126*  --  148* 102* 98 109*  BUN 105*  --  99* 80* 53* 34*  CREATININE 4.56*  --  4.02* 2.77* 1.99* 1.63*  CALCIUM 9.9  --  9.8 9.7 9.9 9.8  MG 1.3*  --  1.6*  --   --   --   PHOS  --  4.1  --  3.5  --   --    Liver Function Tests:  Recent Labs Lab 02/28/15 0100 03/01/15 0630  AST 31  --   ALT 31  --   ALKPHOS 30*  --   BILITOT 0.5  --   PROT 7.4  --   ALBUMIN 3.9 3.6   CBC:  Recent Labs Lab 02/28/15 1340 03/01/15 0630  WBC 5.1 5.1  HGB 10.9* 11.2*  HCT 33.5* 34.6*  MCV 91.5 91.8  PLT 163 155   Cardiac Enzymes:  Recent Labs Lab 02/28/15 0406  CKTOTAL 155   CBG:  Recent Labs Lab 02/28/15 2209  GLUCAP 103*   Thyroid Function  Tests:  Recent Labs Lab 02/28/15 0557 02/28/15 0743  TSH 5.118*  --   FREET4  --  0.89   Coagulation:  Recent Labs Lab 02/28/15 0100 03/01/15 0630 03/02/15 0546 03/03/15 0512  LABPROT 34.3* 36.0* 30.3* 29.9*  INR 3.49* 3.72* 2.96* 2.90*   Alcohol Level:  Recent Labs Lab 02/28/15 0557  ETH <5   Urinalysis:  Recent Labs Lab 02/28/15 0418  COLORURINE YELLOW  LABSPEC 1.011  PHURINE 5.0  GLUCOSEU 100*  HGBUR NEGATIVE  BILIRUBINUR NEGATIVE  KETONESUR NEGATIVE  PROTEINUR NEGATIVE  UROBILINOGEN 0.2  NITRITE NEGATIVE  LEUKOCYTESUR NEGATIVE   Medications: I have reviewed the patient's current medications. Scheduled Meds: . flecainide  50 mg Oral Q12H  . folic acid  1 mg Oral Daily  . latanoprost  1 drop Both Eyes QHS  . metoprolol tartrate  25 mg Oral BID  . multivitamin with minerals  1 tablet Oral Daily  . sodium chloride  3 mL  Intravenous Q12H  . terazosin  5 mg Oral QHS  . thiamine  100 mg Oral Daily   Or  . thiamine  100 mg Intravenous Daily  . Warfarin - Pharmacist Dosing Inpatient   Does not apply q1800   Continuous Infusions:  PRN Meds:.oxyCODONE-acetaminophen Assessment/Plan:  AKI: Cr continues to improve, 1.63 this AM with BUN 34. Baseline Cr 1.2-1.3. Renal US with no obstruction, evidence of medical renal disease (likely hypertension, diabetes). His AKI was most likely due to overdiuresis from diuretics. Will continue to hold diuretics upon discharge since his AKI is still not completely resolved but is trending in right direction. He will need a repeat bmet at his outpatient follow up visit. Patient instructed to record weights daily for next week. His diuretics will be addressed at his outpatient follow up visit.  - Discharge today - Hold torsemide and ARB - Continue Metoprolol 25 mg BID per cards - bmet as outpatient  - Strict I&Os - Follow up appt scheduled   Hx Atrial Fibrillation s/p Cardioversion: Remains in NSR this AM, HR in 60s-70s.  Flecainide restarted yesterday at 50 mg Q12H. He was previously on 150 mg BID. He has follow up with Dr.Klein in August, but cards trying to get him in earlier.  - Cardiology following, appreciate recommendations - Continue Flecainide 50 mg Q12H - Continue Metoprolol 25 mg BID - f/u Flecainide level  Secondary Myoclonus: Resolved. Likely secondary to uremia.  - CIWA - f/u flecainide level  Normocytic Anemia: Hbg 11.6 on admission, stable at 11. Patient with no prior history of anemia. No evidence of acting bleeding. INR now therapeutic.  - Continue coumadin  - Will need repeat CBC and PT/INR at outpatient follow up visit   HTN: BP stable in 272Z-366Y systolic. Patient takes Terazosin 5 mg QHS, Valsartan 320 mg daily, and Lopressor 50 gm BID.  - Continue Terazosin 5 mg QHS - Continue Metoprolol 25 mg BID - Hold ARB and torsemide upon discharge. To be addressed at outpatient follow up visit.   Secondary Hypothyroidism: TSH mildly elevated at 5.118. Free T4 normal at 0.89. Patient is not on any thyroid medication at home. He has had normal TSH levels in the past. Likely due to acute illness. His prolactin level was mildly elevated at 28 which can be secondary to hypothyroidism.  - He will need repeat levels in 6-8 weeks   Diastolic HF: Last echo in May 2016 shows EF 55-60%, mild LVH. Weight 279 lbs on admission. His weight has actually been trending down overall. He was 285 lbs in May. Weight stable at 282 lbs today. No evidence of volume overload on exam. He takes Lopressor 50 mg BID, Torsemide 40 mg daily, and Valsartan 320 mg daily at home. Will hold diuretics at discharge and have patient follow up in the next week in clinic to determine what dose he should be restarted on. Patient instructed to record weights over next week. Will hold ARB as well as BP 403K-742V systolic.  - Continue Metoprolol 25 mg BID - Hold ARB and torsemide upon discharge. To be addressed in 1 week at his follow  up visit - Patient instructed to record weights over the next week.   Type 2 DM: Last HbA1c 7.2 on 12/24/14. Patient is diet controlled currently.  - Continue to monitor glucose on bmets  - Can start sensitive ISS if glucose >180  Chronic Pain Syndrome: Patient has chronic posttraumatic pain. He takes Percocet 10-325 mg Q6H PRN at home.  -  Continue Percocet 5-325 mg Q6H PRN  Hyperlipidemia: Last lipid profile in Dec 2015 shows Chol 108, Trigly 93, HDL 55, and LDL 34. Patient takes Atorvastatin 10 mg daily and Fenofibrate 160 mg daily.  - Continue Fenofibrate - Restart upon discharge   GERD: Patient takes Nexium 40 mg daily at home.  - Restart upon discharge   Alcohol Abuse: He drinks 3-4 shots of liquor daily. He denies any previous alcohol withdrawal symptoms.  - CIWA - Folic acid, thiamine, MVM  OSA: Patient non-compliant with his CPAP at home.   Diet: Heart healthy/ Carb modified  VTE PPx: Coumadin Dispo: Discharge today  The patient does have a current PCP Oval Linsey, MD) and does need an [facility] hospital follow-up appointment after discharge.  The patient does not have transportation limitations that hinder transportation to clinic appointments.  .Services Needed at time of discharge: Y = Yes, Blank = No PT:   OT:   RN:   Equipment:   Other:     LOS: 3 days   Juliet Rude, MD 03/03/2015, 10:44 AM

## 2015-03-03 NOTE — Discharge Summary (Signed)
Name: Juan Calderon MRN: [id number] DOB: [birth year] 59 y.o. PCP: Oval Linsey, MD  Date of Admission: 02/28/2015 12:31 AM Date of Discharge: 03/03/2015 Attending Physician: Annia Belt, MD  Discharge Diagnosis: Principal Problem AKI Secondary to Volume Depletion Secondary Myoclonus Active Problems Hx Atrial Fibrillation s/p Cardioversion Normocytic Anemia HTN Subclinical Hypothyroidism Diastolic HF Type 2 DM Chronic Pain Syndrome Hyperlipidemia GERD Alcohol Abuse OSA  Discharge Medications:   Medication List    STOP taking these medications        allopurinol 300 MG tablet  Commonly known as:  ZYLOPRIM     torsemide 20 MG tablet  Commonly known as:  DEMADEX     valsartan 320 MG tablet  Commonly known as:  DIOVAN      TAKE these medications        atorvastatin 10 MG tablet  Commonly known as:  LIPITOR  Take 1 tablet (10 mg total) by mouth daily.     esomeprazole 40 MG capsule  Commonly known as:  NEXIUM  Take 1 capsule (40 mg total) by mouth daily at 12 noon.     fenofibrate 160 MG tablet  Take 1 tablet (160 mg total) by mouth daily.     flecainide 50 MG tablet  Commonly known as:  TAMBOCOR  Take 1 tablet (50 mg total) by mouth every 12 (twelve) hours.     latanoprost 0.005 % ophthalmic solution  Commonly known as:  XALATAN  Place 1 drop into both eyes at bedtime.     metoprolol tartrate 25 MG tablet  Commonly known as:  LOPRESSOR  Take 1 tablet (25 mg total) by mouth 2 (two) times daily.     nitroGLYCERIN 0.4 MG SL tablet  Commonly known as:  NITROSTAT  Place 0.4 mg under the tongue every 5 (five) minutes as needed for chest pain.     oxyCODONE-acetaminophen 10-325 MG per tablet  Commonly known as:  PERCOCET  Take 1 tablet by mouth every 6 (six) hours as needed for pain.     sorbitol 70 % solution  Take 15-60 mLs by mouth daily as needed (mix in coffee, juice, or water).     sulindac 200 MG tablet  Commonly known as:  CLINORIL   Take 200 mg by mouth 2 (two) times daily as needed.     terazosin 5 MG capsule  Commonly known as:  HYTRIN  Take 1 capsule (5 mg total) by mouth at bedtime.     warfarin 5 MG tablet  Commonly known as:  COUMADIN  Take 1/2 tablet (2.5 mg) by mouth every day except on Sundays take 1 tablet (5 mg)        Disposition and follow-up:   Juan Calderon was discharged from [facility] in Good condition.  At the hospital follow up visit please address:  1.  AKI- Check bmet. Determine if Valsartan and Torsemide should be restarted. Likely needs lower dose of Torsemide.  Secondary Myoclonus- Has pt had any additional "tremors"?  AFib s/p Cardioversion- Is pt taking lower dose of Flecainide 50 mg BID and Metoprolol 25 mg BID?  Diastolic HF- Pt asked to record weights. Determine if Valsartan and Torsemide should be restarted. He may need a lower dose of Torsemide. Baseline weight 285 lbs. Discharge weight 282 lbs.  Normocytic Anemia- Needs repeat CBC. No prior hx of anemia. No active bleeding in hospital.  HTN- BP recheck. Check bmet. Determine if Valsartan should be restarted. Pt asked to record  weights, determine if Torsemide should be restarted.  Subclinical Hypothyroidism- Needs repeat TSH and T4 levels in 6-8 weeks (August 8-22)  2.  Labs / imaging needed at time of follow-up: CBC, bmet, PT/INR  3.  Pending labs/ test needing follow-up: Flecainide level  Follow-up Appointments:     Follow-up Information    Follow up with Juluis Mire, MD.   Specialty:  Internal Medicine   Why:  Appointment on July 6th at 8:15 AM   Contact information:   [place] [id number] [phone]       Follow up with Patsey Berthold, NP.   Specialty:  Nurse Practitioner   Why:  Appointment with Cardiology on July 18th at 8:30 AM   Contact information:   [place] [id number] [phone]       Discharge Instructions:   Consultations:     Procedures Performed:  Dg Chest 2 View  02/28/2015   CLINICAL DATA:  Dizziness, tremor. History of CHF, atrial fibrillation, hypertension, COPD.  EXAM: CHEST  2 VIEW  COMPARISON:  Chest radiograph September 26, 2014  FINDINGS: Moderate cardiomegaly, stable from prior imaging. Mediastinal silhouette is unremarkable. Central pulmonary vascular congestion and mild interstitial prominence is similar to prior examination without pleural effusion or focal consolidation. No pneumothorax. Large body habitus. Osseous structures are nonsuspicious.  IMPRESSION: Stable cardiomegaly and pulmonary vascular congestion/ mild interstitial edema.   Electronically Signed   By: Elon Alas M.D.   On: 02/28/2015 01:31   US Renal  02/28/2015   CLINICAL DATA:  Acute kidney injury.  EXAM: RENAL / URINARY TRACT ULTRASOUND COMPLETE  COMPARISON:  None.  FINDINGS: Right Kidney:  Length: 12.7. Echogenicity appears increased. No mass or hydronephrosis visualized.  Left Kidney:  Length: 12.3. Echogenicity appears increased. No hydronephrosis visualized. A simple lower pole cyst measures 4.0 cm in diameter.  Bladder:  Appears normal for degree of bladder distention.  IMPRESSION: Negative for hydronephrosis or acute abnormality.  Increased cortical echogenicity compatible with medical renal disease.   Electronically Signed   By: Inge Rise M.D.   On: 02/28/2015 21:42    Admission HPI: Pt is a 59 y/o male w/ PMHx of afib on coumadin, CAD, HTN, diastolic HF, obesity, and alcohol abuse who presents with tremors. Pt states he started experiencing tremors one week after he was cardioverted on 4/28 for persistent afib. Prior to cardioversion he was also started on flecainide 150mg  BID by his cardiologist Dr. [name]. Pt states that tremors are intermittent and affect all 4 extremities, he cannot tell what brings them on. Tremors last a few minutes each time and have progressively increased to the point he thought they were going to  last all day today. Pt states he can tell when the tremors are about to start b/c he will feel "electrical activity" run through his body. Tonight around 11:00pm pt was laying in bed when he experience an episode of tremoring that last a few minutes however they were worse than what he has experienced this past month and he decided to come to the ED. He states that he has had light tremors the whole day and the previous day he experience around 2-3 episodes of tremos. He denies any pain during tremors, nor confusion, loss of BM or urinary incontinence, weakness, chest palpitations, chest pain, n/v. He denies hx of seizures or family hx of seizures. He drinks 3-4 shots of brown liquor daily x 1 year and has not tried to cut  down recently. He has not gone through withdrawal from alcohol before. He does not smoke and denies illicit drug use. He endorses good appetite and his weight is stable. For about 6 months pt states he gets dizzy upon standing from a seated position that starts from his head down to his feet and resolves with sitting down or after standing up for a while. Since is cardioversion his SOB has resolved. In the ED pt's creatinine was noted to be elevated at 4.56, prior creatinine on 4/21 was 1.22. He was therefore admitted for tremors and AKI.    Hospital Course by problem list:  AKI Secondary to Volume Depletion: Patient presented with dizziness and low BP on admission found to have a Cr 4.56 with BUN 105 on admission. His Cr continued to improve steadily with IVF administration.  His UA was completely normal. A Renal US was checked which had no evidence of obstruction. His Cr trended down from 4.56>4.02>2.77>1.99 and was 1.63 upon discharge. His AKI was attributed to severe volume depletion secondary to diuretic overuse. His Torsemide and Valsartan were held and he was recommended to continue holding them upon discharge until his outpatient follow up visit. He will need a repeat bmet and  determination of whether his Torsemide and Valsartan should be restarted at his follow up visit in 1 week.   Secondary Myoclonus: Patient presented with hx of "tremors" starting 1 week after he was cardioverted for his AFib on 12/30/14. There was concern that his myoclonus could be secondary to flecainide toxicity, but his myoclonic jerking was attributed to uremia as he had a concomitant AKI. His myoclonus had improved significantly after IVF hydration and improvement of his BUN/Cr. He had no evidence of myoclonus upon discharge. His flecainide level was still pending upon discharge and will need to be followed up at his outpatient visit.   Hx Atrial Fibrillation s/p Cardioversion: Patient was started on Flecainide 150 mg BID by Dr. [name] and then underwent cardioversion on 12/30/14 with Dr. Debara Pickett. He was noted to be in sinus bradycardia on 01/20/15 with HR 59. Patient was found to be in AFib on admission, but then converted to sinus bradycardia with HR in 40s-50s. Cardiology was consulted and recommended to hold his home flecainide and metoprolol. His home Flecainide was held as there was concern for possible Flecainide toxicity given his myoclonus. He remained in NSR for the remainder of his hospitalization. He was restarted on a lower dose of Metoprolol 25 mg BID given his low HR and a lower dose of Flecainide 50 mg BID due to his AKI. His flecainide level was pending upon discharge. He has follow up with cardiology on 03/21/15.   Diastolic HF: Last echo in May 2016 shows EF 55-60%, mild LVH. Weight was 279 lbs on admission, which is lower than his baseline weight of 285 lbs. He was determined to be volume depleted secondary to overuse of diuretics. His home BP medications, including Lopressor 50 mg BID, Torsemide 40 mg daily, and Valsartan 320 mg daily were held due to his lower BPs and AKI. He was restarted on a lower dose of Lopressor 25 mg BID once his BP could tolerate. His diuretics were held upon  discharge given his weight was still down at 282 lbs and his AKI still resolving. Patient was instructed to record weights over next week to help determine if Torsemide should be restarted at his outpatient visit. He will need a repeat bmet at his outpatient visit. Decision of whether to  restart Valsartan and Torsemide should also be addressed.   Normocytic Anemia: Hbg 11.6 on admission, stable at 11. Patient with no prior history of anemia. No evidence of acting bleeding. His INR was supratherapeutic on admission at 3.49. His home coumadin was held. By time of discharge his INR was therapeutic at 2.90 and his home Coumadin 2.5 mg daily except Sundays (5 mg) was restarted upon discharge. He will need a repeat CBC and PT/INR at his outpatient visit.   HTN: Blood pressures remained stable in 097D-532D systolic. Patient was continued on his home Terazosin 5 mg QHS. His home Valsartan and Torsemide were held due to his AKI. His Metoprolol was decreased to 25 mg BID. He was recommended to not take his Valsartan and Torsemide upon discharge until he has his follow up appointment in the IM clinic in 1 week. He was instructed to record his weights for the week before his appointment to determine if Torsemide should be restarted. He will need a repeat bmet at his outpatient visit.   Subclinical Hypothyroidism: TSH was found to be mildly elevated at 5.118 while working up his recurrent AFib. His free T4 was normal at 0.89. Patient is not on any thyroid medication at home. He has had normal TSH levels in the past. This acute elevation is likely due to acute illness. A prolactin level was also checked due to his gynecomastia on exam and was found to be mildly elevated at 28 which can be secondary to hypothyroidism. He will need repeat thyroid levels in 6-8 weeks.   Type 2 DM: Last HbA1c 7.2 on 12/24/14. Patient is diet controlled. His CBGs remained controlled during his hospitalization without any insulin.   Chronic  Pain Syndrome: Patient has chronic posttraumatic pain. His home Percocet was held initially due to his AKI. He was then restarted on a lower dose of Percocet 5-325 mg Q6H once his kidney function improved. He was discharged on his home Percocet 10-325 mg Q6H PRN.   Hyperlipidemia: Last lipid profile in Dec 2015 shows Chol 108, Trigly 93, HDL 55, and LDL 34. Patient was continued on his home Fenofibrate 160 mg daily. His home Atorvastatin 10 mg daily was held due to his AKI. Atorvastatin was resumed upon discharge.   GERD: Patient's home Nexium 40 mg daily was held during hospitalization as he had AKI. His AKI had improved by time of discharge and this medication was resumed.   Alcohol Abuse: Patient drinks 3-4 shots of liquor daily. He denied any previous alcohol withdrawal symptoms. He was placed on CIWA protocol during his hospitalization. He did not require any Ativan. He was advised on alcohol cessation.   OSA: Patient non-compliant with his CPAP at home. He was educated on the benefits of using his CPAP.   Discharge Vitals:   BP 108/59 mmHg  Pulse 62  Temp(Src) 98 F (36.7 C) (Oral)  Resp 18  Ht 5\' 11"  (1.803 m)  Wt 282 lb (127.914 kg)  BMI 39.35 kg/m2  SpO2 96% Physical Exam General: sitting up in chair, NAD HEENT: [place]/AT, EOMI, sclera anicteric, mucus membranes moist CV: RRR, no m/g/r Pulm: CTA bilaterally, breaths non-labored Abd: BS+, soft, obese, non-tender Ext: warm, no edema Neuro: alert and oriented x 3, no focal deficits  Discharge Labs:  Results for orders placed or performed during the hospital encounter of 02/28/15 (from the past 24 hour(s))  Protime-INR     Status: Abnormal   Collection Time: 03/03/15  5:12 AM  Result Value Ref Range  Prothrombin Time 29.9 (H) 11.6 - 15.2 seconds   INR 2.90 (H) 0.00 - 3.35  Basic metabolic panel     Status: Abnormal   Collection Time: 03/03/15  8:10 AM  Result Value Ref Range   Sodium 138 135 - 145 mmol/L   Potassium 4.8 3.5  - 5.1 mmol/L   Chloride 105 101 - 111 mmol/L   CO2 24 22 - 32 mmol/L   Glucose, Bld 109 (H) 65 - 99 mg/dL   BUN 34 (H) 6 - 20 mg/dL   Creatinine, Ser 1.63 (H) 0.61 - 1.24 mg/dL   Calcium 9.8 8.9 - 10.3 mg/dL   GFR calc non Af Amer 44 (L) >60 mL/min   GFR calc Af Amer 51 (L) >60 mL/min   Anion gap 9 5 - 15    Signed: Juliet Rude, MD 03/03/2015, 11:16 AM    Services Ordered on Discharge: None Equipment Ordered on Discharge: None

## 2015-03-03 NOTE — Discharge Instructions (Signed)
It was a pleasure taking care of you, Mr. Nahar.  You were hospitalized for both your atrial fibrillation and you had damage to your kidneys from taking too much diuretics. Your kidney function is almost back to your baseline. We will check this again at your clinic appointment.   - Do not take your Torsemide or Valsartan at home. Restarting these medications will be discussed at your clinic appointment.  - Please weigh yourself daily and write down the number until you are seen in the clinic. This will help [place] determine if your Torsemide needs to be restarted. - Your Flecainide has been changed to 50 mg twice daily. Please continue taking this lower dose.  - Your Metoprolol has been changed to 25 mg twice a day.  - You have a Cardiology appointment on July 18th at 8:30 AM  Take care, Dr. Arcelia Jew

## 2015-03-03 NOTE — Progress Notes (Signed)
Patient discharged home with family. Patient was given discharge instructions and education on HF and AFIb. Patient was discharged with belongings and was stable upon discharge. Patient walked out and refused wheelchair or help.

## 2015-03-05 LAB — FLECAINIDE LEVEL: Flecainide: 1.27 ug/mL (ref 0.20–1.00)

## 2015-03-06 NOTE — Progress Notes (Signed)
Lab called with critical lab value: Flecainide=1.27. Report given to [facility]. Contacted Dr.Emokpae, result given. Per Dr.Emokpae, value was anticipated to be high and patient was treated prior to discharge. No further orders,patient to be seen at follow up.

## 2015-03-09 ENCOUNTER — Encounter: Payer: Self-pay | Admitting: Internal Medicine

## 2015-03-09 ENCOUNTER — Ambulatory Visit (INDEPENDENT_AMBULATORY_CARE_PROVIDER_SITE_OTHER): Payer: Medicare Other | Admitting: Internal Medicine

## 2015-03-09 ENCOUNTER — Encounter (INDEPENDENT_AMBULATORY_CARE_PROVIDER_SITE_OTHER): Payer: Medicare Other

## 2015-03-09 ENCOUNTER — Ambulatory Visit (INDEPENDENT_AMBULATORY_CARE_PROVIDER_SITE_OTHER): Payer: Self-pay | Admitting: Internal Medicine

## 2015-03-09 VITALS — BP 124/63 | HR 66 | Temp 97.9°F | Ht 71.0 in | Wt 282.3 lb

## 2015-03-09 DIAGNOSIS — I130 Hypertensive heart and chronic kidney disease with heart failure and stage 1 through stage 4 chronic kidney disease, or unspecified chronic kidney disease: Secondary | ICD-10-CM | POA: Diagnosis not present

## 2015-03-09 DIAGNOSIS — D649 Anemia, unspecified: Secondary | ICD-10-CM | POA: Diagnosis not present

## 2015-03-09 DIAGNOSIS — E038 Other specified hypothyroidism: Secondary | ICD-10-CM

## 2015-03-09 DIAGNOSIS — I10 Essential (primary) hypertension: Secondary | ICD-10-CM

## 2015-03-09 DIAGNOSIS — I4891 Unspecified atrial fibrillation: Secondary | ICD-10-CM | POA: Diagnosis not present

## 2015-03-09 DIAGNOSIS — N182 Chronic kidney disease, stage 2 (mild): Secondary | ICD-10-CM | POA: Diagnosis not present

## 2015-03-09 DIAGNOSIS — E785 Hyperlipidemia, unspecified: Secondary | ICD-10-CM | POA: Diagnosis not present

## 2015-03-09 DIAGNOSIS — Z7901 Long term (current) use of anticoagulants: Secondary | ICD-10-CM

## 2015-03-09 DIAGNOSIS — I5032 Chronic diastolic (congestive) heart failure: Secondary | ICD-10-CM

## 2015-03-09 DIAGNOSIS — E039 Hypothyroidism, unspecified: Secondary | ICD-10-CM

## 2015-03-09 DIAGNOSIS — G894 Chronic pain syndrome: Secondary | ICD-10-CM

## 2015-03-09 DIAGNOSIS — G253 Myoclonus: Secondary | ICD-10-CM

## 2015-03-09 DIAGNOSIS — D539 Nutritional anemia, unspecified: Secondary | ICD-10-CM | POA: Diagnosis not present

## 2015-03-09 DIAGNOSIS — Z0000 Encounter for general adult medical examination without abnormal findings: Secondary | ICD-10-CM

## 2015-03-09 DIAGNOSIS — I481 Persistent atrial fibrillation: Secondary | ICD-10-CM

## 2015-03-09 DIAGNOSIS — I4819 Other persistent atrial fibrillation: Secondary | ICD-10-CM

## 2015-03-09 DIAGNOSIS — Z5181 Encounter for therapeutic drug level monitoring: Secondary | ICD-10-CM

## 2015-03-09 LAB — ANEMIA PANEL
%SAT: 27 % (ref 20–55)
ABS RETIC: 89.3 10*3/uL (ref 19.0–186.0)
FERRITIN: 1167 ng/mL — AB (ref 22–322)
FOLATE: 7.4 ng/mL
Iron: 109 ug/dL (ref 42–165)
RBC.: 3.72 MIL/uL — ABNORMAL LOW (ref 4.22–5.81)
Retic Ct Pct: 2.4 % — ABNORMAL HIGH (ref 0.4–2.3)
TIBC: 405 ug/dL (ref 215–435)
UIBC: 296 ug/dL (ref 125–400)
Vitamin B-12: 981 pg/mL — ABNORMAL HIGH (ref 211–911)

## 2015-03-09 LAB — CBC
HEMATOCRIT: 33.1 % — AB (ref 39.0–52.0)
HEMOGLOBIN: 11.3 g/dL — AB (ref 13.0–17.0)
MCH: 30.4 pg (ref 26.0–34.0)
MCHC: 34.1 g/dL (ref 30.0–36.0)
MCV: 89 fL (ref 78.0–100.0)
MPV: 9.3 fL (ref 8.6–12.4)
Platelets: 196 10*3/uL (ref 150–400)
RBC: 3.72 MIL/uL — AB (ref 4.22–5.81)
RDW: 16.3 % — ABNORMAL HIGH (ref 11.5–15.5)
WBC: 5.6 10*3/uL (ref 4.0–10.5)

## 2015-03-09 LAB — BASIC METABOLIC PANEL
Anion gap: 9 (ref 5–15)
BUN: 23 mg/dL — AB (ref 6–20)
CO2: 25 mmol/L (ref 22–32)
Calcium: 9.6 mg/dL (ref 8.9–10.3)
Chloride: 105 mmol/L (ref 101–111)
Creatinine, Ser: 1.47 mg/dL — ABNORMAL HIGH (ref 0.61–1.24)
GFR, EST AFRICAN AMERICAN: 58 mL/min — AB (ref >60)
GFR, EST NON AFRICAN AMERICAN: 50 mL/min — AB (ref >60)
Glucose, Bld: 143 mg/dL — ABNORMAL HIGH (ref 65–99)
Potassium: 4.6 mmol/L (ref 3.5–5.1)
SODIUM: 139 mmol/L (ref 135–145)

## 2015-03-09 LAB — HEPATITIS C ANTIBODY: HCV AB: NEGATIVE

## 2015-03-09 LAB — HIV ANTIBODY (ROUTINE TESTING W REFLEX): HIV: NONREACTIVE

## 2015-03-09 LAB — POCT INR: INR: 2.4

## 2015-03-09 MED ORDER — OXYCODONE-ACETAMINOPHEN 10-325 MG PO TABS: 1.0000 | ORAL_TABLET | Freq: Four times a day (QID) | ORAL | Status: DC | PRN

## 2015-03-09 NOTE — Patient Instructions (Signed)
-  Keep holding volsartan and and torsemide until you see cardiology on July 18th  -Keep checking your weight at home -Keep taking flecainide 50 mg twice a day and lopressor 25 mg twice a day -You INR today is 2.4 which is at goal of 2 to 3.  -Please come back to see Dr. Eppie Gibson, very nice meeting you!   General Instructions:   Thank you for bringing your medicines today. This helps [place] keep you safe from mistakes.   Progress Toward Treatment Goals:  Treatment Goal 12/24/2014  Hemoglobin A1C deteriorated  Blood pressure at goal    Self Care Goals & Plans:  Self Care Goal 03/09/2015  Manage my medications take my medicines as prescribed; bring my medications to every visit; refill my medications on time; follow the sick day instructions if I am sick  Monitor my health keep track of my blood pressure; keep track of my weight; check my feet daily  Eat healthy foods eat more vegetables; eat fruit for snacks and desserts; eat baked foods instead of fried foods; eat smaller portions  Be physically active find an activity I enjoy    Home Blood Glucose Monitoring 12/24/2014  Check my blood sugar no home glucose monitoring  When to check my blood sugar N/A     Care Management & Community Referrals:  Referral 12/24/2014  Referrals made for care management support none needed  Referrals made to community resources none

## 2015-03-09 NOTE — Progress Notes (Signed)
Internal Medicine Clinic Attending  Case discussed with Dr. Naaman Plummer. We reviewed the resident's history and exam and pertinent patient test results.  I agree with the assessment, diagnosis, and plan of care documented in the resident's note.

## 2015-03-09 NOTE — Progress Notes (Signed)
Patient ID: Juan Calderon, male   DOB: 1955/12/25, 59 y.o.   MRN: [id number]    Subjective:   Patient ID: Juan Calderon male   DOB: May 25, 1956 59 y.o.   MRN: [id number]  HPI: [name] is a 59 y.o. man with past medical history of hypertension, PAF s/p cardioversion on coumadin and flecainide, hyperlipidemia, chronic anemia, chronic diastolic CHF, CKD stage 2, non-insulin dependent Type 2 DM, chronic pain syndrome, non-tophaceous gout, alcohol abuse, GERD, and OSA who presents for hospital follow-up visit.   He was hospitalized from 6/27-30 for AKI on CKD Stage 2 thought to be pre-renal azotemia due to volume depletion in setting of diuretic usage with Cr 4.56 on admission that improved to 1.63 on discharge. His home torsemide 40 mg daily and volsartan 320 mg daily were held during hospitalization and on discharge. He has history of diastolic CHF. He reports normal urine output, stable weight, and denies orthopnea, dyspnea on exertion, or LE edema.     He is s/p recent cardioversion for atrial fibrillation on 12/30/14 and had initial atrial fibrillation on admission that converted to sinus bradycardia. His home flecainide was consequently reduced in dosage from 150 mg BID to 50 mg BID and lopressor was reduced from 50 mg BID to 25 mg BID. He had myoclonus on admission thought to be due to uremia vs flecainide toxicity which resolved during hospitalization. His flecainide level which resulted after discharge was mildly elevated at 1.27 however in setting of AKI on CKD Stage 2. He has had no further myoclonus since discharge. He also denies chest pain, palpitations, weakness, lightheadedness, or syncope.   He has history of normocytic anemia with baseline Hg 11. He is on chronic coumadin for atrial fibrillation. He had colonoscopy in 2010 that showed diverticulosis with repeat recommended in 10 years. He also reports having an EGD done the same year however no records in our system. He denies GI  or GU bleeding.   He is compliant with taking fenofibrate and lipitor without myalgias.   His TSH was found to be mildly elevated at 5.118 with normal free T4 during hospitalization. His prolactin levels were also found to be elevated at 28 in setting of gynecomastia.   He has chronic pain syndrome of left arm and leg after traumatic injury with pain controlled with percocet 10-325 mg every 6 hours as needed for pain.    Past Medical History  Diagnosis Date  . Essential hypertension 09/20/2011  . Atrial fibrillation 02/04/2012  . Chronic diastolic heart failure 12/06/8097    with mild left ventricular hypertrophy on Echo 02/2010  . Chronic pain syndrome 01/15/2013    Left arm and leg s/p traumatic injury   . Osteoarthritis cervical spine 04/25/2013  . Gastroesophageal reflux disease 04/25/2013  . Open-angle glaucoma 04/25/2013  . 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 04/25/2013  . Hyperlipidemia LDL goal < 100 04/25/2013  . Coronary artery disease 04/25/2013    25% LAD stenosis on cath 2007.  Stable angina.  . Asthma, chronic 04/25/2013    Clinical diagnosis  . Gout 04/25/2013  . Right rotator cuff tear 04/25/2013    Large full-thickness tear of the supraspinatus with mild retraction but no atrophy   . Vasomotor rhinitis 04/25/2013  . Obstructive sleep apnea 06/01/2013    Moderate, AHI 29.8 per hour with moderately loud snoring and oxygen desaturation to a nadir of 79%. CPAP titration resulted in a prescription for 17 CWP.    [name]  Ulcer     History of ulcer in the remote past  . Cataract     Left eye  . Onychomycosis of toenail 12/24/2012    Left great toe   . Morbid obesity with BMI of 40.0-44.9, adult 04/25/2013  . Blood transfusion without reported diagnosis   . Chronic obstructive pulmonary disease 04/25/2013  . Diverticulosis 11/12/2013  . Osteoarthritis of left knee 06/19/2013    Tricompartmental disease.  Treated with double hinged upright knee brace,  steroid/xylocaine knee injections, and NSAIDs   . CHF (congestive heart failure)   . Shortness of breath   . Chronic renal insufficiency 07/12/2014  . Alcohol abuse 02/28/2015   Current Outpatient Prescriptions  Medication Sig Dispense Refill  . atorvastatin (LIPITOR) 10 MG tablet Take 1 tablet (10 mg total) by mouth daily. 90 tablet 3  . esomeprazole (NEXIUM) 40 MG capsule Take 1 capsule (40 mg total) by mouth daily at 12 noon. 90 capsule 3  . fenofibrate 160 MG tablet Take 1 tablet (160 mg total) by mouth daily. 90 tablet 3  . flecainide (TAMBOCOR) 50 MG tablet Take 1 tablet (50 mg total) by mouth every 12 (twelve) hours. 60 tablet 1  . latanoprost (XALATAN) 0.005 % ophthalmic solution Place 1 drop into both eyes at bedtime. 7.5 mL 3  . metoprolol tartrate (LOPRESSOR) 25 MG tablet Take 1 tablet (25 mg total) by mouth 2 (two) times daily. 60 tablet 1  . nitroGLYCERIN (NITROSTAT) 0.4 MG SL tablet Place 0.4 mg under the tongue every 5 (five) minutes as needed for chest pain.    [name] oxyCODONE-acetaminophen (PERCOCET) 10-325 MG per tablet Take 1 tablet by mouth every 6 (six) hours as needed for pain. 120 tablet 0  . sorbitol 70 % solution Take 15-60 mLs by mouth daily as needed (mix in coffee, juice, or water). 473 mL 11  . sulindac (CLINORIL) 200 MG tablet Take 200 mg by mouth 2 (two) times daily as needed.  3  . terazosin (HYTRIN) 5 MG capsule Take 1 capsule (5 mg total) by mouth at bedtime. 90 capsule 3  . warfarin (COUMADIN) 5 MG tablet Take 1/2 tablet (2.5 mg) by mouth every day except on Sundays take 1 tablet (5 mg) 30 tablet 1   No current facility-administered medications for this visit.   Family History  Problem Relation Age of Onset  . Heart failure Mother   . Alzheimer's disease Father   . Healthy Sister   . Healthy Brother   . Healthy Son   . Healthy Sister   . Healthy Sister   . Healthy Sister   . Healthy Sister   . Healthy Brother   . Healthy Brother   . Healthy Brother     . Heart failure Brother   . Healthy Brother   . Osteoarthritis Brother   . Prostate cancer Brother   . Early death Brother     Manufacturing systems engineer  . Heart attack Neg Hx   . Stroke Neg Hx   . Hypertension Sister   . Hypertension Brother    History   Social History  . Marital Status: Widowed    Spouse Name: N/A  . Number of Children: N/A  . Years of Education: N/A   Social History Main Topics  . Smoking status: Never Smoker   . Smokeless tobacco: Never Used  . Alcohol Use: 1.0 oz/week    2 drink(s) per week     Comment: Liquor.  . Drug Use: No  .  Sexual Activity: Yes    Birth Control/ Protection: None   Other Topics Concern  . Not on file   Social History Narrative   Review of Systems: Review of Systems  Constitutional: Negative for malaise/fatigue.  Respiratory: Negative for cough, shortness of breath and wheezing.   Cardiovascular: Negative for chest pain, orthopnea and leg swelling.  Gastrointestinal: Negative for nausea, vomiting, abdominal pain, diarrhea, constipation, blood in stool and melena.  Genitourinary: Negative for dysuria, urgency, frequency and hematuria.  Musculoskeletal: Negative for myalgias.       Chronic left arm and leg pain  Neurological: Negative for dizziness, weakness and headaches.     Objective:  Physical Exam: Filed Vitals:   03/09/15 0822  BP: 124/63  Pulse: 66  Temp: 97.9 F (36.6 C)  TempSrc: Oral  Height: 5\' 11"  (1.803 m)  Weight: 282 lb 4.8 oz (128.05 kg)  SpO2: 97%    Physical Exam  Constitutional: He is oriented to person, place, and time. He appears well-developed and well-nourished. No distress.  HENT:  Head: Normocephalic and atraumatic.  Right Ear: External ear normal.  Left Ear: External ear normal.  Nose: Nose normal.  Mouth/Throat: Oropharynx is clear and moist. No oropharyngeal exudate.  Eyes: Conjunctivae and EOM are normal. Pupils are equal, round, and reactive to light. Right eye exhibits no discharge.  Left eye exhibits no discharge. No scleral icterus.  Neck: Normal range of motion. Neck supple.  Cardiovascular: Normal rate, regular rhythm and normal heart sounds.   Pulmonary/Chest: Breath sounds normal. No respiratory distress. He has no wheezes. He has no rales.  Abdominal: Soft. Bowel sounds are normal. He exhibits no distension. There is no tenderness. There is no rebound and no guarding.  Musculoskeletal: Normal range of motion. He exhibits no edema or tenderness.  Neurological: He is alert and oriented to person, place, and time.  Skin: Skin is warm and dry. No rash noted. He is not diaphoretic. No erythema. No pallor.  Psychiatric: He has a normal mood and affect. His behavior is normal. Judgment and thought content normal.    Assessment & Plan:   Please see problem list for problem-based assessment and plan

## 2015-03-10 ENCOUNTER — Encounter: Payer: Self-pay | Admitting: Internal Medicine

## 2015-03-10 NOTE — Assessment & Plan Note (Signed)
Assessment: Pt with chronic pain syndrome with left UE/LE pain s/p traumatic injury who presents with well-controlled pain on narcotic therapy with no red flag symptoms.  Plan: -Refill monthly perocet 10-325 mg Q 6 hr PRN pain (#120) for 3 month supply to last until 07/01/15

## 2015-03-10 NOTE — Assessment & Plan Note (Addendum)
Assessment: Pt with persistent atrial fibrillation s/p cardioversion on 12/30/14 compliant with medical therapy who presents with normal sinus rhythm/rate and therapeutic INR.   Plan:  -Obtain POCT INR ---> 2.4 (result sent to his cardiologist office) -Continue coumadin 5 mg daily at bedtime  -Continue metoprolol tartrate 25 mg BID and flecainide 50 mg BID -Pt to follow-up with cardiology on 03/21/15

## 2015-03-10 NOTE — Assessment & Plan Note (Signed)
-  Obtain screening HIV Ab ---> non-reactive -Obtain screening HCV Ab (born b/w 1947-65) ---> negative

## 2015-03-10 NOTE — Assessment & Plan Note (Signed)
Assessment: Pt with recent myoclonus thought to be due to uremia in setting of AKI on CKD Stage 2 vs flecainide toxicity who presents with no further episodes of myoclonus.     Plan:  -Monitor for reoccurrence

## 2015-03-10 NOTE — Assessment & Plan Note (Signed)
Assessment: Pt with mildly elevated TSH of 5.118 with normal free T4 on 02/28/15 who presents with no symptoms of thyroid dysfunction.  Plan:  -Repeat TSH and free T4 in 6 weeks (end of August 2016) -Consider measuring anti-TPO if TSH continues to be elevated

## 2015-03-10 NOTE — Assessment & Plan Note (Signed)
Assessment: Pt with well-controlled hypertension compliant with two-class (BB & alpha-1 blocker) anti-hypertensive therapy who presents with blood pressure of 124/63.  Plan: -BP 124/63 at goal <140/90 -Obtain stat BMP ---> Cr improved to 1.47 from 1.63 but still not at baseline 1.3 -Pt instructed to continue to hold home torsemide 40 mg daily and volsartan 320 mg daily until renal function improves  -Continue metoprolol tartrate 25 mg BID and terazosin 5 mg daily

## 2015-03-10 NOTE — Assessment & Plan Note (Signed)
Assessment: Pt with chronic diastolic CHF with last 2D-echo on 01/06/15 with normal EF 55-60% who presents with no exacerbation.    Plan:  -Wt unchanged from 03/02/15 at 282 lb -Obtain stat BMP ---> Cr improved to 1.47 from 1.63 but still not at baseline 1.3 -Pt instructed to continue to hold home torsemide 40 mg daily and volsartan 320 mg daily until renal function improves, no current signs of volume overload  -Continue metoprolol tartrate  25 mg BID

## 2015-03-10 NOTE — Assessment & Plan Note (Signed)
Assessment: Pt with last lipid panel on 08/13/14 that was normal compliant with fibrate and moderate intensity statin therapy with calculated 10-yr ASCVD risk >7.5% with recommendations to continue moderate to high intensity statin therapy.   Plan:  -Continue atorvastatin 10 mg daily -Will discuss with PCP if can discontinue fenofibrate 160 mg daily in setting of normal TG level and increased risk of rhabdomyolysis with statin therapy  -Liver profile on 02/28/15 was normal -Continue to monitor for myalgias

## 2015-03-10 NOTE — Assessment & Plan Note (Addendum)
Assessment: Pt on chronic AC therapy (coumadin) with chronic normocytic anemia with last colonoscopy in 2010 with diverticulosis and CKD Stage 2 who presents with no active bleeding or hemodynamic instability.   Plan:  -Obtain CBC w/no diff ---> Hg 11.3 and Hct 33.1 -Obtain anemia panel ----> ferritin elevated at 1167 with high TIBC and normal Tstat 27% possibly due to mixed disease -Obtain records from previous EGD (pt reports having done in 2010)

## 2015-03-10 NOTE — Assessment & Plan Note (Signed)
Assessment: Pt with recent non-olguric AKI on CKD Stage 2 in setting of diuretic and ARB use who presents with normal urine output.    Plan:  -Obtain stat BMP ---> Cr improved to 1.47 from 1.63 but still not at baseline 1.3 -Pt instructed to continue to hold home torsemide 40 mg daily and volsartan 320 mg daily until renal function improves, no current signs of volume overload  -Pt instructed to avoid NSAID use

## 2015-03-19 ENCOUNTER — Encounter: Payer: Self-pay | Admitting: Nurse Practitioner

## 2015-03-19 NOTE — Progress Notes (Signed)
Electrophysiology Office Note Date: 03/21/2015  ID:  Juan Calderon, Juan Calderon 03-Apr-1956, MRN [id number]  PCP: Juan Cobble, MD Electrophysiologist: Juan Calderon  CC: hospitalization follow up  Juan Calderon is a 59 y.o. male seen today for Dr Juan Calderon.  He has persistent atrial fibrillation and has been maintained on Flecainide. He last underwent cardioversion in 12/2014.  He presented to the hospital last month with myoclonus and was found to have AKI.  He was hydrated and his renal function improved.  His Torsemide and ARB have been held.  There was some concern for Flecainide toxicity, but his symptoms resolved with improvement in renal function and he was discharged home on Flecainide as prior to admission.   He presents today for post hospital electrophysiology followup.  Since discharge, the patient reports doing reasonably well.  He denies chest pain, palpitations, dyspnea, PND, orthopnea, nausea, vomiting, dizziness, syncope, edema, weight gain, or early satiety.  ROS and other history was difficult to obtain today from the patient as he would give one word answers and not participate in discussion.   Last echo 01/2015 demonstrated EF 55-60%, mild LVH, LA 48.    Past Medical History  Diagnosis Date  . Essential hypertension    . Persistent atrial fibrillation    . Chronic diastolic heart failure      with mild left ventricular hypertrophy on Echo 02/2010  . Chronic pain syndrome      Left arm and leg s/p traumatic injury   . Osteoarthritis cervical spine    . Gastroesophageal reflux disease    . Open-angle glaucoma    . 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    . Hyperlipidemia LDL goal < 100    . Coronary artery disease      25% LAD stenosis on cath 2007.  Stable angina.  . Gout    . Right rotator cuff tear      Large full-thickness tear of the supraspinatus with mild retraction but no atrophy   . Obstructive sleep apnea      Moderate, AHI 29.8  per hour with moderately loud snoring and oxygen desaturation to a nadir of 79%. CPAP titration resulted in a prescription for 17 CWP.    [name] Cataract     Left eye  . Morbid obesity with BMI of 40.0-44.9, adult    . Chronic obstructive pulmonary disease    . Diverticulosis    . Osteoarthritis of left knee 06/19/2013    Tricompartmental disease.  Treated with double hinged upright knee brace, steroid/xylocaine knee injections, and NSAIDs   . Chronic renal insufficiency    . Alcohol abuse    . Subclinical hypothyroidism    . Normocytic anemia     Past Surgical History  Procedure Laterality Date  . Left leg surgery    . Left arm surgery    . Shoulder surgery      Right  . Fracture surgery Left 1980's    Elbow  . Cardioversion N/A 12/30/2014    Procedure: CARDIOVERSION;  Surgeon: [name], MD;  Location: [facility] ENDOSCOPY;  Service: Cardiovascular;  Laterality: N/A;    Current Outpatient Prescriptions  Medication Sig Dispense Refill  . atorvastatin (LIPITOR) 10 MG tablet Take 1 tablet (10 mg total) by mouth daily. 90 tablet 3  . esomeprazole (NEXIUM) 40 MG capsule Take 1 capsule (40 mg total) by mouth daily at 12 noon. 90 capsule 3  . fenofibrate 160 MG tablet Take 1  tablet (160 mg total) by mouth daily. 90 tablet 3  . flecainide (TAMBOCOR) 50 MG tablet Take 1 tablet (50 mg total) by mouth every 12 (twelve) hours. 60 tablet 1  . latanoprost (XALATAN) 0.005 % ophthalmic solution Place 1 drop into both eyes at bedtime. 7.5 mL 3  . metoprolol tartrate (LOPRESSOR) 25 MG tablet Take 1 tablet (25 mg total) by mouth 2 (two) times daily. 60 tablet 1  . nitroGLYCERIN (NITROSTAT) 0.4 MG SL tablet Place 0.4 mg under the tongue every 5 (five) minutes as needed for chest pain.    [name] oxyCODONE-acetaminophen (PERCOCET) 10-325 MG per tablet Take 1 tablet by mouth every 6 (six) hours as needed for pain. 120 tablet 0  . sorbitol 70 % solution Take 15-60 mLs by mouth daily as needed (mix in coffee, juice,  or water). 473 mL 11  . sulindac (CLINORIL) 200 MG tablet Take 200 mg by mouth 2 (two) times daily as needed.  3  . terazosin (HYTRIN) 5 MG capsule Take 1 capsule (5 mg total) by mouth at bedtime. 90 capsule 3  . warfarin (COUMADIN) 5 MG tablet Take 1/2 tablet (2.5 mg) by mouth every day except on Sundays take 1 tablet (5 mg) 30 tablet 1   No current facility-administered medications for this visit.    Allergies:   Ramipril and Testosterone   Social History: History   Social History  . Marital Status: Widowed    Spouse Name: N/A  . Number of Children: N/A  . Years of Education: N/A   Occupational History  . Not on file.   Social History Main Topics  . Smoking status: Never Smoker   . Smokeless tobacco: Never Used  . Alcohol Use: 1.0 oz/week    2 drink(s) per week     Comment: Liquor.  . Drug Use: No  . Sexual Activity: Yes    Birth Control/ Protection: None   Other Topics Concern  . Not on file   Social History Narrative    Family History: Family History  Problem Relation Age of Onset  . Heart failure Mother   . Alzheimer's disease Father   . Healthy Sister   . Healthy Brother   . Healthy Son   . Healthy Sister   . Healthy Sister   . Healthy Sister   . Healthy Sister   . Healthy Brother   . Healthy Brother   . Healthy Brother   . Heart failure Brother   . Healthy Brother   . Osteoarthritis Brother   . Prostate cancer Brother   . Early death Brother     Manufacturing systems engineer  . Heart attack Neg Hx   . Stroke Neg Hx   . Hypertension Sister   . Hypertension Brother     Review of Systems: All other systems reviewed and are otherwise negative except as noted above.   Physical Exam: VS:  BP 130/70 mmHg  Pulse 71  Ht 5\' 11"  (1.803 m)  Wt 286 lb (129.729 kg)  BMI 39.91 kg/m2 , BMI Body mass index is 39.91 kg/(m^2). Wt Readings from Last 3 Encounters:  03/21/15 286 lb (129.729 kg)  03/09/15 282 lb 4.8 oz (128.05 kg)  03/02/15 282 lb (127.914 kg)     GEN- The patient is obese appearing, alert and oriented x 3 today.   HEENT: normocephalic, atraumatic; sclera clear, conjunctiva pink; hearing intact; oropharynx clear; neck supple, poor dentition Lungs- Clear to ausculation bilaterally, normal work of breathing.  No wheezes,  rales, rhonchi Heart- Regular rate and rhythm, no murmurs, rubs or gallops  GI- obese, non-tender, non-distended, bowel sounds present  Extremities- no clubbing, cyanosis, or edema; DP/PT/radial pulses 2+ bilaterally MS- no significant deformity or atrophy Skin- warm and dry, no rash or lesion  Psych- euthymic mood, flat affect Neuro- strength and sensation are intact   EKG:  EKG is ordered today. The ekg ordered today shows sinus rhythm, rate 71, iRBBB, PVC  Recent Labs: 06/18/2014: Pro B Natriuretic peptide (BNP) 1148.0* 09/26/2014: B Natriuretic Peptide 119.2* 02/28/2015: ALT 31; Magnesium 1.6*; TSH 5.118* 03/09/2015: BUN 23*; Creatinine, Ser 1.47*; Hemoglobin 11.3*; Platelets 196; Potassium 4.6; Sodium 139    Other studies Reviewed: Additional studies/ records that were reviewed today include:AF clinic notes, hospital records  Assessment and Plan: 1.  Persistent atrial fibrillation Maintaining SR on Flecainide Continue Warfarin for CHADS2VASC of at least 3 BMET 7/6 improved with creat of 1.47  2.  Chronic renal insufficiency Improved from hospitalization Will continue to hold ARB and diuretic for now  3.  Chronic diastolic heart failure Euvolemic on exam Weights stable per patient report at home Hold diuretic as above  4.  Morbid obesity Weight loss encouraged Long term, this is the most effective treatment for his AF Body mass index is 39.91 kg/(m^2).  5.  Sleep apnea Unable to tolerate CPAP   Current medicines are reviewed at length with the patient today.   The patient does not have concerns regarding his medicines.  The following changes were made today:  none  Labs/ tests ordered  today include: none    Disposition:   Follow up with Dr Juan Calderon in 4 weeks as scheduled - would repeat BMET at that time and consider adding back ARB.    Signed, Chanetta Marshall, NP 03/21/2015 9:01 AM   [facility] [address] [place] [address] [phone] (office) [phone] (fax)

## 2015-03-21 ENCOUNTER — Encounter: Payer: Self-pay | Admitting: Nurse Practitioner

## 2015-03-21 ENCOUNTER — Ambulatory Visit (INDEPENDENT_AMBULATORY_CARE_PROVIDER_SITE_OTHER): Payer: Medicare Other | Admitting: Nurse Practitioner

## 2015-03-21 VITALS — BP 130/70 | HR 71 | Ht 71.0 in | Wt 286.0 lb

## 2015-03-21 DIAGNOSIS — I2510 Atherosclerotic heart disease of native coronary artery without angina pectoris: Secondary | ICD-10-CM

## 2015-03-21 DIAGNOSIS — N182 Chronic kidney disease, stage 2 (mild): Secondary | ICD-10-CM | POA: Diagnosis not present

## 2015-03-21 DIAGNOSIS — G4730 Sleep apnea, unspecified: Secondary | ICD-10-CM

## 2015-03-21 DIAGNOSIS — I4819 Other persistent atrial fibrillation: Secondary | ICD-10-CM

## 2015-03-21 DIAGNOSIS — I481 Persistent atrial fibrillation: Secondary | ICD-10-CM | POA: Diagnosis not present

## 2015-03-21 DIAGNOSIS — I5032 Chronic diastolic (congestive) heart failure: Secondary | ICD-10-CM

## 2015-03-21 NOTE — Patient Instructions (Signed)
Medication Instructions:   Your physician recommends that you continue on your current medications as directed. Please refer to the Current Medication list given to you today.    Labwork:  NONE TODAY    Testing/Procedures:  NONE TODAY   Follow-Up:     Any Other Special Instructions Will Be Listed Below (If Applicable).

## 2015-03-28 ENCOUNTER — Other Ambulatory Visit: Payer: Self-pay | Admitting: Internal Medicine

## 2015-03-28 DIAGNOSIS — E785 Hyperlipidemia, unspecified: Secondary | ICD-10-CM

## 2015-04-06 ENCOUNTER — Ambulatory Visit (INDEPENDENT_AMBULATORY_CARE_PROVIDER_SITE_OTHER): Payer: Medicare Other | Admitting: *Deleted

## 2015-04-06 DIAGNOSIS — Z5181 Encounter for therapeutic drug level monitoring: Secondary | ICD-10-CM | POA: Diagnosis not present

## 2015-04-06 DIAGNOSIS — I4891 Unspecified atrial fibrillation: Secondary | ICD-10-CM

## 2015-04-06 LAB — POCT INR: INR: 1.7

## 2015-04-21 ENCOUNTER — Telehealth: Payer: Self-pay | Admitting: Internal Medicine

## 2015-04-21 ENCOUNTER — Ambulatory Visit (INDEPENDENT_AMBULATORY_CARE_PROVIDER_SITE_OTHER): Payer: Medicare Other | Admitting: Pharmacist

## 2015-04-21 ENCOUNTER — Ambulatory Visit (INDEPENDENT_AMBULATORY_CARE_PROVIDER_SITE_OTHER): Payer: Medicare Other | Admitting: Internal Medicine

## 2015-04-21 ENCOUNTER — Encounter: Payer: Self-pay | Admitting: Internal Medicine

## 2015-04-21 VITALS — BP 138/70 | HR 72 | Ht 71.0 in | Wt 290.1 lb

## 2015-04-21 DIAGNOSIS — Z7901 Long term (current) use of anticoagulants: Secondary | ICD-10-CM | POA: Diagnosis not present

## 2015-04-21 DIAGNOSIS — I2510 Atherosclerotic heart disease of native coronary artery without angina pectoris: Secondary | ICD-10-CM

## 2015-04-21 DIAGNOSIS — Z5181 Encounter for therapeutic drug level monitoring: Secondary | ICD-10-CM

## 2015-04-21 DIAGNOSIS — I4891 Unspecified atrial fibrillation: Secondary | ICD-10-CM | POA: Diagnosis not present

## 2015-04-21 DIAGNOSIS — I4819 Other persistent atrial fibrillation: Secondary | ICD-10-CM

## 2015-04-21 DIAGNOSIS — I481 Persistent atrial fibrillation: Secondary | ICD-10-CM | POA: Diagnosis not present

## 2015-04-21 LAB — POCT INR: INR: 1.4

## 2015-04-21 MED ORDER — TORSEMIDE 20 MG PO TABS: 40.0000 mg | ORAL_TABLET | ORAL | Status: DC

## 2015-04-21 NOTE — Telephone Encounter (Signed)
New message      Need clarification on dosage for torsemide.  He got 2 different directions sent in

## 2015-04-21 NOTE — Telephone Encounter (Signed)
Discussed reason 2 rx sent in.  Pharmacist verbalized understanding.

## 2015-04-21 NOTE — Progress Notes (Signed)
Patient Care Team: Oval Linsey, MD as PCP - General (Internal Medicine) Clent Jacks, MD as Consulting Physician (Ophthalmology)   HPI  Juan Calderon is a 59 y.o. male Seen in follow-up for atrial fibrillation. He was started on flecainide. He has been holding sinus rhythm.  He has had an intercurrent hospitalization prompted by mild clonus and is associated with acute kidney.  Echocardiogram 5/16-EF 55-60% with an LA dimension of 48.  He has ongoing complaints of shortness of breath. This is worsened since having been in hospital where his diuretics were discontinued  He has a history of sleep apnea. He was tried on CPAP but has been intolerant.  Records and Results Reviewed As above    Past Medical History  Diagnosis Date  . Essential hypertension    . Persistent atrial fibrillation    . Chronic diastolic heart failure      with mild left ventricular hypertrophy on Echo 02/2010  . Chronic pain syndrome      Left arm and leg s/p traumatic injury   . Osteoarthritis cervical spine    . Gastroesophageal reflux disease    . Open-angle glaucoma    . 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    . Hyperlipidemia LDL goal < 100    . Coronary artery disease      25% LAD stenosis on cath 2007.  Stable angina.  . Gout    . Right rotator cuff tear      Large full-thickness tear of the supraspinatus with mild retraction but no atrophy   . Obstructive sleep apnea      Moderate, AHI 29.8 per hour with moderately loud snoring and oxygen desaturation to a nadir of 79%. CPAP titration resulted in a prescription for 17 CWP.    [name] Cataract     Left eye  . Morbid obesity with BMI of 40.0-44.9, adult    . Chronic obstructive pulmonary disease    . Diverticulosis    . Osteoarthritis of left knee 06/19/2013    Tricompartmental disease.  Treated with double hinged upright knee brace, steroid/xylocaine knee injections, and NSAIDs   . Chronic renal  insufficiency    . Alcohol abuse    . Subclinical hypothyroidism    . Normocytic anemia      Past Surgical History  Procedure Laterality Date  . Left leg surgery    . Left arm surgery    . Shoulder surgery      Right  . Fracture surgery Left 1980's    Elbow  . Cardioversion N/A 12/30/2014    Procedure: CARDIOVERSION;  Surgeon: [name], MD;  Location: [facility] ENDOSCOPY;  Service: Cardiovascular;  Laterality: N/A;    Current Outpatient Prescriptions  Medication Sig Dispense Refill  . atorvastatin (LIPITOR) 10 MG tablet Take 1 tablet (10 mg total) by mouth daily. 90 tablet 3  . esomeprazole (NEXIUM) 40 MG capsule Take 1 capsule (40 mg total) by mouth daily at 12 noon. 90 capsule 3  . fenofibrate 160 MG tablet Take 1 tablet (160 mg total) by mouth daily. 90 tablet 3  . flecainide (TAMBOCOR) 50 MG tablet Take 1 tablet (50 mg total) by mouth every 12 (twelve) hours. 60 tablet 1  . latanoprost (XALATAN) 0.005 % ophthalmic solution Place 1 drop into both eyes at bedtime. 7.5 mL 3  . metoprolol tartrate (LOPRESSOR) 25 MG tablet Take 1 tablet (25 mg total) by mouth 2 (two) times daily. 60 tablet  1  . nitroGLYCERIN (NITROSTAT) 0.4 MG SL tablet Place 0.4 mg under the tongue every 5 (five) minutes as needed for chest pain.    [name] oxyCODONE-acetaminophen (PERCOCET) 10-325 MG per tablet Take 1 tablet by mouth every 6 (six) hours as needed for pain. 120 tablet 0  . sorbitol 70 % solution Take 15-60 mLs by mouth daily as needed (mix in coffee, juice, or water). 473 mL 11  . sulindac (CLINORIL) 200 MG tablet Take 200 mg by mouth 2 (two) times daily as needed.  3  . terazosin (HYTRIN) 5 MG capsule Take 1 capsule (5 mg total) by mouth at bedtime. 90 capsule 3  . warfarin (COUMADIN) 5 MG tablet Take 1/2 tablet (2.5 mg) by mouth every day except on Sundays take 1 tablet (5 mg) 30 tablet 1   No current facility-administered medications for this visit.    Allergies  Allergen Reactions  . Ramipril  Swelling  . Testosterone Rash      Review of Systems negative except from HPI and PMH  Physical Exam BP 138/70 mmHg  Pulse 72  Ht 5\' 11"  (1.803 m)  Wt 290 lb 1.9 oz (131.598 kg)  BMI 40.48 kg/m2 Well developed and well nourished in no acute distress HENT normal E scleral and icterus clear Neck Supple JVP>10carotids brisk and full Clear to ausculation  Regular rate and rhythm, no murmurs gallops or rub Soft with active bowel sounds No clubbing cyanosis 2+ Edema Alert and oriented, grossly normal motor and sensory function Skin Warm and Dry  ECG demonstrates sinus rhythm at 57-interval 17/09/49 Axis 51+ nonspecific T-wave changes  Assessment and  Plan  Atrial fibrillation paroxysmal   HFpEF  Morbid obesity  History of acute kidney injury question mechanism  Obstructive sleep apnea  He has been intolerant of CPAP unfortunately. Perhaps an oral appliance not withstanding his AHI been almost 30 would be of some marginal benefit. I will ask him to discuss this with his internal medicine doctor with whom he will be visiting in a few weeks.  We will resume his diuretics. Somehow they were stopped at hospital. We  will take 40 mg daily for 3 days and then take 40 mg a couple of days a week as opposed to 20 mg twice daily which had been his pervious regime. ( torsemide )  With his history of kidney injury before we'll plan to check his metabolic profile in 2 weeks   We will continue him on flecainide. The dose is really very low for a man his size. There is no significant widening of his QRS. He would have recurrent atrial fibrillation associated with cardioversion I would increase his flecainide from 50--100 mg a day.  We will have him see DC in 6 months and I will see him in 12 months

## 2015-04-21 NOTE — Patient Instructions (Addendum)
Medication Instructions:  Your physician has recommended you make the following change in your medication:  1) RESUME Torsemide -- take 40 mg for 3 days, then reduce and take 40 mg twice per week.  Labwork: None ordered  Testing/Procedures: None ordered  Follow-Up: Your physician wants you to follow-up in: 6 months with Roderic Palau, NP in the [facility]. You will receive a reminder letter in the mail two months in advance. If you don't receive a letter, please call our office to schedule the follow-up appointment.  Your physician wants you to follow-up in: 1 year with Dr. [name].  You will receive a reminder letter in the mail two months in advance. If you don't receive a letter, please call our office to schedule the follow-up appointment.  Any Other Special Instructions Will Be Listed Below (If Applicable). Thank you for choosing [facility]!!

## 2015-04-24 ENCOUNTER — Other Ambulatory Visit: Payer: Self-pay | Admitting: Internal Medicine

## 2015-04-24 DIAGNOSIS — I4891 Unspecified atrial fibrillation: Secondary | ICD-10-CM

## 2015-04-24 DIAGNOSIS — I10 Essential (primary) hypertension: Secondary | ICD-10-CM

## 2015-04-28 ENCOUNTER — Ambulatory Visit (INDEPENDENT_AMBULATORY_CARE_PROVIDER_SITE_OTHER): Payer: Medicare Other | Admitting: *Deleted

## 2015-04-28 DIAGNOSIS — Z7901 Long term (current) use of anticoagulants: Secondary | ICD-10-CM

## 2015-04-28 DIAGNOSIS — I4891 Unspecified atrial fibrillation: Secondary | ICD-10-CM | POA: Diagnosis not present

## 2015-04-28 DIAGNOSIS — Z5181 Encounter for therapeutic drug level monitoring: Secondary | ICD-10-CM | POA: Diagnosis not present

## 2015-04-28 LAB — POCT INR: INR: 2.3

## 2015-05-04 ENCOUNTER — Ambulatory Visit: Payer: Medicare Other | Admitting: Internal Medicine

## 2015-05-11 ENCOUNTER — Other Ambulatory Visit: Payer: Self-pay | Admitting: Internal Medicine

## 2015-05-11 DIAGNOSIS — M1A00X0 Idiopathic chronic gout, unspecified site, without tophus (tophi): Secondary | ICD-10-CM

## 2015-05-11 DIAGNOSIS — K219 Gastro-esophageal reflux disease without esophagitis: Secondary | ICD-10-CM

## 2015-05-13 ENCOUNTER — Ambulatory Visit (INDEPENDENT_AMBULATORY_CARE_PROVIDER_SITE_OTHER): Payer: Medicare Other | Admitting: Pharmacist

## 2015-05-13 DIAGNOSIS — I4891 Unspecified atrial fibrillation: Secondary | ICD-10-CM

## 2015-05-13 DIAGNOSIS — Z7901 Long term (current) use of anticoagulants: Secondary | ICD-10-CM

## 2015-05-13 DIAGNOSIS — Z5181 Encounter for therapeutic drug level monitoring: Secondary | ICD-10-CM | POA: Diagnosis not present

## 2015-05-13 LAB — POCT INR: INR: 1.9

## 2015-05-21 ENCOUNTER — Emergency Department (HOSPITAL_COMMUNITY)
Admission: EM | Admit: 2015-05-21 | Discharge: 2015-05-21 | Disposition: A | Discharge status: Home / Self Care | Payer: Medicare Other | Attending: Emergency Medicine | Admitting: Emergency Medicine

## 2015-05-21 ENCOUNTER — Encounter (HOSPITAL_COMMUNITY): Payer: Self-pay | Admitting: Emergency Medicine

## 2015-05-21 ENCOUNTER — Emergency Department (HOSPITAL_COMMUNITY): Payer: Medicare Other

## 2015-05-21 ENCOUNTER — Other Ambulatory Visit: Payer: Self-pay | Admitting: Internal Medicine

## 2015-05-21 DIAGNOSIS — M25562 Pain in left knee: Secondary | ICD-10-CM | POA: Diagnosis not present

## 2015-05-21 DIAGNOSIS — Z79899 Other long term (current) drug therapy: Secondary | ICD-10-CM | POA: Diagnosis not present

## 2015-05-21 DIAGNOSIS — Y9389 Activity, other specified: Secondary | ICD-10-CM | POA: Diagnosis not present

## 2015-05-21 DIAGNOSIS — I2510 Atherosclerotic heart disease of native coronary artery without angina pectoris: Secondary | ICD-10-CM | POA: Insufficient documentation

## 2015-05-21 DIAGNOSIS — J449 Chronic obstructive pulmonary disease, unspecified: Secondary | ICD-10-CM | POA: Insufficient documentation

## 2015-05-21 DIAGNOSIS — G4733 Obstructive sleep apnea (adult) (pediatric): Secondary | ICD-10-CM | POA: Insufficient documentation

## 2015-05-21 DIAGNOSIS — W19XXXA Unspecified fall, initial encounter: Secondary | ICD-10-CM

## 2015-05-21 DIAGNOSIS — M79662 Pain in left lower leg: Secondary | ICD-10-CM | POA: Diagnosis not present

## 2015-05-21 DIAGNOSIS — G894 Chronic pain syndrome: Secondary | ICD-10-CM | POA: Insufficient documentation

## 2015-05-21 DIAGNOSIS — Y998 Other external cause status: Secondary | ICD-10-CM | POA: Insufficient documentation

## 2015-05-21 DIAGNOSIS — K219 Gastro-esophageal reflux disease without esophagitis: Secondary | ICD-10-CM | POA: Insufficient documentation

## 2015-05-21 DIAGNOSIS — Z9981 Dependence on supplemental oxygen: Secondary | ICD-10-CM | POA: Insufficient documentation

## 2015-05-21 DIAGNOSIS — I5032 Chronic diastolic (congestive) heart failure: Secondary | ICD-10-CM | POA: Diagnosis not present

## 2015-05-21 DIAGNOSIS — Y9289 Other specified places as the place of occurrence of the external cause: Secondary | ICD-10-CM | POA: Diagnosis not present

## 2015-05-21 DIAGNOSIS — R609 Edema, unspecified: Secondary | ICD-10-CM

## 2015-05-21 DIAGNOSIS — W1839XA Other fall on same level, initial encounter: Secondary | ICD-10-CM | POA: Insufficient documentation

## 2015-05-21 DIAGNOSIS — I10 Essential (primary) hypertension: Secondary | ICD-10-CM | POA: Insufficient documentation

## 2015-05-21 DIAGNOSIS — R2242 Localized swelling, mass and lump, left lower limb: Secondary | ICD-10-CM | POA: Diagnosis not present

## 2015-05-21 DIAGNOSIS — M79651 Pain in right thigh: Secondary | ICD-10-CM

## 2015-05-21 DIAGNOSIS — M109 Gout, unspecified: Secondary | ICD-10-CM | POA: Diagnosis not present

## 2015-05-21 DIAGNOSIS — H269 Unspecified cataract: Secondary | ICD-10-CM | POA: Diagnosis not present

## 2015-05-21 DIAGNOSIS — E119 Type 2 diabetes mellitus without complications: Secondary | ICD-10-CM | POA: Diagnosis not present

## 2015-05-21 DIAGNOSIS — Z7901 Long term (current) use of anticoagulants: Secondary | ICD-10-CM | POA: Diagnosis not present

## 2015-05-21 DIAGNOSIS — S79921A Unspecified injury of right thigh, initial encounter: Secondary | ICD-10-CM | POA: Insufficient documentation

## 2015-05-21 DIAGNOSIS — M79652 Pain in left thigh: Secondary | ICD-10-CM | POA: Diagnosis not present

## 2015-05-21 MED ORDER — OXYCODONE-ACETAMINOPHEN 5-325 MG PO TABS
2.0000 | ORAL_TABLET | Freq: Once | ORAL | Status: AC
  Administered 2015-05-21: 2 via ORAL
  Filled 2015-05-21: qty 2

## 2015-05-21 NOTE — ED Notes (Signed)
MD at bedside at this time.

## 2015-05-21 NOTE — Discharge Instructions (Signed)
Suspected Muscle Strain A muscle strain is an injury that occurs when a muscle is stretched beyond its normal length. Usually a small number of muscle fibers are torn when this happens. Muscle strain is rated in degrees. First-degree strains have the least amount of muscle fiber tearing and pain. Second-degree and third-degree strains have increasingly more tearing and pain.  Usually, recovery from muscle strain takes 1-2 weeks. Complete healing takes 5-6 weeks.  CAUSES  Muscle strain happens when a sudden, violent force placed on a muscle stretches it too far. This may occur with lifting, sports, or a fall.  RISK FACTORS Muscle strain is especially common in athletes.  SIGNS AND SYMPTOMS At the site of the muscle strain, there may be:  Pain.  Bruising.  Swelling.  Difficulty using the muscle due to pain or lack of normal function. DIAGNOSIS  Your health care provider will perform a physical exam and ask about your medical history. TREATMENT  Often, the best treatment for a muscle strain is resting, icing, and applying cold compresses to the injured area.  HOME CARE INSTRUCTIONS   Use the PRICE method of treatment to promote muscle healing during the first 2-3 days after your injury. The PRICE method involves:  Protecting the muscle from being injured again.  Restricting your activity and resting the injured body part.  Icing your injury. To do this, put ice in a plastic bag. Place a towel between your skin and the bag. Then, apply the ice and leave it on from 15-20 minutes each hour. After the third day, switch to moist heat packs.  Apply compression to the injured area with a splint or elastic bandage. Be careful not to wrap it too tightly. This may interfere with blood circulation or increase swelling.  Elevate the injured body part above the level of your heart as often as you can.  Only take over-the-counter or prescription medicines for pain, discomfort, or fever as directed  by your health care provider.  Warming up prior to exercise helps to prevent future muscle strains. SEEK MEDICAL CARE IF:   You have increasing pain or swelling in the injured area.  You have numbness, tingling, or a significant loss of strength in the injured area. MAKE SURE YOU:   Understand these instructions.  Will watch your condition.  Will get help right away if you are not doing well or get worse. Document Released: 08/20/2005 Document Revised: 06/10/2013 Document Reviewed: 03/19/2013 [facility] Patient Information 2015 [place], [place]. This information is not intended to replace advice given to you by your health care provider. Make sure you discuss any questions you have with your health care provider.

## 2015-05-21 NOTE — ED Provider Notes (Signed)
CSN: [id number]     Arrival date & time 05/21/15  1646 History   First MD Initiated Contact with Patient 05/21/15 1947     Chief Complaint  Patient presents with  . Leg Swelling     (Consider location/radiation/quality/duration/timing/severity/associated sxs/prior Treatment) HPI Patient has a history of extensive surgical repair of his left lower leg from distant injury. He chronically has swelling and wears a compression sock to the thigh. He notes that about 2 days ago he fell and landed on his left knee. He states at the time he did not notice a significant amount of pain or swelling. Since then however he has noted swelling and discomfort diffusely in his thigh this is mostly on the back of the leg and the medial portion. No associated redness. He states he is mildly short of breath, however he reports he has congestive heart failure and he reports he gets "some extra fluid". Past Medical History  Diagnosis Date  . Essential hypertension    . Persistent atrial fibrillation    . Chronic diastolic heart failure      with mild left ventricular hypertrophy on Echo 02/2010  . Chronic pain syndrome      Left arm and leg s/p traumatic injury   . Osteoarthritis cervical spine    . Gastroesophageal reflux disease    . Open-angle glaucoma    . 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    . Hyperlipidemia LDL goal < 100    . Coronary artery disease      25% LAD stenosis on cath 2007.  Stable angina.  . Gout    . Right rotator cuff tear      Large full-thickness tear of the supraspinatus with mild retraction but no atrophy   . Obstructive sleep apnea      Moderate, AHI 29.8 per hour with moderately loud snoring and oxygen desaturation to a nadir of 79%. CPAP titration resulted in a prescription for 17 CWP.    [name] Cataract     Left eye  . Morbid obesity with BMI of 40.0-44.9, adult    . Chronic obstructive pulmonary disease    . Diverticulosis    . Osteoarthritis  of left knee 06/19/2013    Tricompartmental disease.  Treated with double hinged upright knee brace, steroid/xylocaine knee injections, and NSAIDs   . Chronic renal insufficiency    . Alcohol abuse    . Subclinical hypothyroidism    . Normocytic anemia     Past Surgical History  Procedure Laterality Date  . Left leg surgery    . Left arm surgery    . Shoulder surgery      Right  . Fracture surgery Left 1980's    Elbow  . Cardioversion N/A 12/30/2014    Procedure: CARDIOVERSION;  Surgeon: [name], MD;  Location: [facility] ENDOSCOPY;  Service: Cardiovascular;  Laterality: N/A;   Family History  Problem Relation Age of Onset  . Heart failure Mother   . Alzheimer's disease Father   . Healthy Sister   . Healthy Brother   . Healthy Son   . Healthy Sister   . Healthy Sister   . Healthy Sister   . Healthy Sister   . Healthy Brother   . Healthy Brother   . Healthy Brother   . Heart failure Brother   . Healthy Brother   . Osteoarthritis Brother   . Prostate cancer Brother   . Early death Brother  Gun Shot Wound  . Heart attack Neg Hx   . Stroke Neg Hx   . Hypertension Sister   . Hypertension Brother    Social History  Substance Use Topics  . Smoking status: Never Smoker   . Smokeless tobacco: Never Used  . Alcohol Use: 1.0 oz/week    2 drink(s) per week     Comment: Liquor.    Review of Systems Constitutional: No fever no general malaise Respiratory: No cough or chest pain.   Allergies  Ramipril and Testosterone  Home Medications   Prior to Admission medications   Medication Sig Start Date End Date Taking? Authorizing Provider  allopurinol (ZYLOPRIM) 300 MG tablet Take 1 tablet (300 mg total) by mouth daily. 05/11/15   Oval Linsey, MD  atorvastatin (LIPITOR) 10 MG tablet Take 1 tablet (10 mg total) by mouth daily. 05/20/14   Oval Linsey, MD  esomeprazole (NEXIUM) 40 MG capsule Take 1 capsule (40 mg total) by mouth daily. 05/11/15   Oval Linsey, MD   fenofibrate 160 MG tablet Take 1 tablet (160 mg total) by mouth daily. 03/28/15   Oval Linsey, MD  flecainide (TAMBOCOR) 50 MG tablet Take 1 tablet (50 mg total) by mouth every 12 (twelve) hours. 04/25/15   Oval Linsey, MD  latanoprost (XALATAN) 0.005 % ophthalmic solution Place 1 drop into both eyes at bedtime. 01/27/15   Oval Linsey, MD  metoprolol tartrate (LOPRESSOR) 25 MG tablet Take 1 tablet (25 mg total) by mouth 2 (two) times daily. 04/25/15   Oval Linsey, MD  nitroGLYCERIN (NITROSTAT) 0.4 MG SL tablet Place 0.4 mg under the tongue every 5 (five) minutes as needed for chest pain.    Historical Provider, MD  oxyCODONE-acetaminophen (PERCOCET) 10-325 MG per tablet Take 1 tablet by mouth every 6 (six) hours as needed for pain. 03/09/15   Juluis Mire, MD  sorbitol 70 % solution Take 15-60 mLs by mouth daily as needed (mix in coffee, juice, or water). 12/24/14   Oval Linsey, MD  sulindac (CLINORIL) 200 MG tablet Take 200 mg by mouth 2 (two) times daily as needed. 02/16/15   Historical Provider, MD  terazosin (HYTRIN) 5 MG capsule Take 1 capsule (5 mg total) by mouth at bedtime. 07/07/14   Oval Linsey, MD  torsemide (DEMADEX) 20 MG tablet Take 2 tablets (40 mg total) by mouth 2 (two) times a week. 04/21/15   Deboraha Sprang, MD  torsemide (DEMADEX) 20 MG tablet Take 2 tablets (40 mg total) by mouth 3 days. 04/21/15   Deboraha Sprang, MD  warfarin (COUMADIN) 5 MG tablet Take 1/2 tablet (2.5 mg) by mouth every day except on Sundays take 1 tablet (5 mg) 01/21/15   Oval Linsey, MD   BP 119/52 mmHg  Pulse 73  Temp(Src) 97.8 F (36.6 C) (Oral)  Resp 20  Ht 5\' 11"  (1.803 m)  Wt 290 lb (131.543 kg)  BMI 40.46 kg/m2  SpO2 99% Physical Exam  Constitutional: He is oriented to person, place, and time.  Patient is morbidly obese. He is alert and nontoxic.  HENT:  Head: Normocephalic and atraumatic.  Eyes: EOM are normal.  Cardiovascular: Normal rate, regular rhythm, normal heart sounds  and intact distal pulses.   Pulmonary/Chest: Effort normal and breath sounds normal. No respiratory distress.  Musculoskeletal:  Left lower extremity has extensive old surgical scarring and deformity of the lower portion of the leg. The skin is thin but no open wounds or ulcerations are present. There is no erythema  or suggestion of cellulitis. The foot is warm and dry. The distal pulses 2+. Patient endorses discomfort to palpation behind the popliteal fossa and the medial and posterior thigh. The femoral pulses 2+ there is no groin mass or tenderness. Palpation of the soft tissues does not reveal any areas of induration or erythema. Soft tissues of the thigh are pliable with good skin condition.  Neurological: He is alert and oriented to person, place, and time. Coordination normal.  Skin: Skin is warm and dry.  Psychiatric: He has a normal mood and affect.    ED Course  Procedures (including critical care time) Labs Review Labs Reviewed - No data to display  Imaging Review Dg Knee Complete 4 Views Left  05/21/2015   CLINICAL DATA:  59 year old male with fall and left lower extremity and knee pain.  EXAM: LEFT KNEE - COMPLETE 4+ VIEW; LEFT FEMUR 2 VIEWS  COMPARISON:  None.  FINDINGS: There is no acute fracture or dislocation. There is a segmental area of bone widening with cortical thickening involving the distal femur which may be related to an old healed fracture deformity or represent Paget's. Correlation with history of prior fracture is recommended. No cortical destruction or definite increase CT features identified, however; If there was pain in the distal femur prior to the current injury further orthopedic evaluation is recommended to exclude malignancy. The bones are osteopenic. There is osteoarthritic changes of the knee with juxta-articular spurring and narrowing of the medial compartment. The soft tissues appear unremarkable.  IMPRESSION: No acute fracture or dislocation.  Segmental bone  widening and cortical thickening of distal femur which may be related to an old healed fracture deformity or Paget's. Correlation with history and direct comparison with prior imaging studies, if available, recommended. If patient had pain prior to the current injury further orthopedic workup is warranted to exclude neoplasm.   Electronically Signed   By: Anner Crete M.D.   On: 05/21/2015 23:18   Dg Femur Min 2 Views Left  05/21/2015   CLINICAL DATA:  59 year old male with fall and left lower extremity and knee pain.  EXAM: LEFT KNEE - COMPLETE 4+ VIEW; LEFT FEMUR 2 VIEWS  COMPARISON:  None.  FINDINGS: There is no acute fracture or dislocation. There is a segmental area of bone widening with cortical thickening involving the distal femur which may be related to an old healed fracture deformity or represent Paget's. Correlation with history of prior fracture is recommended. No cortical destruction or definite increase CT features identified, however; If there was pain in the distal femur prior to the current injury further orthopedic evaluation is recommended to exclude malignancy. The bones are osteopenic. There is osteoarthritic changes of the knee with juxta-articular spurring and narrowing of the medial compartment. The soft tissues appear unremarkable.  IMPRESSION: No acute fracture or dislocation.  Segmental bone widening and cortical thickening of distal femur which may be related to an old healed fracture deformity or Paget's. Correlation with history and direct comparison with prior imaging studies, if available, recommended. If patient had pain prior to the current injury further orthopedic workup is warranted to exclude neoplasm.   Electronically Signed   By: Anner Crete M.D.   On: 05/21/2015 23:18   I have personally reviewed and evaluated these images and lab results as part of my medical decision-making.   EKG Interpretation None      MDM   Final diagnoses:  Pain of right thigh   Swelling   There are several  possible etiologies for the patient's pain. Primarily he fell a couple of days ago and developed pain several days afterwards. Muscle strain appears to be very likely diagnosis at this time. He has pain that is not the medial abductor area and predominantly the hamstring region. There are no palpable abnormalities. The groin examination is normal without mass fullness or pulse abnormality. The patient does have significant old injury to the lower leg. He wears a tight compression socks that is creating a bit of a tourniquet effect at the mid upper thigh. He may also be noting increased swelling and discomfort from the compression of the sock. I believe DVT is less likely. He is not experiencing any symptoms of the lower leg he is already anticoagulated. The patient is given a prescription for outpatient Doppler ultrasound. At this time he is otherwise well in appearance and has instructions for follow-up and elevating the extremity. He reports yard he has medication at home for pain.    Charlesetta Shanks, MD 05/21/15 [phone]

## 2015-05-21 NOTE — ED Notes (Signed)
Pt c/o pain and swelling to left leg that goes from knee up to his groin. Pt reports that he fell on it Thursday or Friday.

## 2015-05-22 ENCOUNTER — Ambulatory Visit (HOSPITAL_COMMUNITY)
Admission: RE | Admit: 2015-05-22 | Discharge: 2015-05-22 | Disposition: A | Discharge status: Home / Self Care | Payer: Medicare Other | Source: Ambulatory Visit | Attending: Emergency Medicine | Admitting: Emergency Medicine

## 2015-05-22 DIAGNOSIS — K219 Gastro-esophageal reflux disease without esophagitis: Secondary | ICD-10-CM | POA: Insufficient documentation

## 2015-05-22 DIAGNOSIS — M79609 Pain in unspecified limb: Secondary | ICD-10-CM

## 2015-05-22 DIAGNOSIS — G4733 Obstructive sleep apnea (adult) (pediatric): Secondary | ICD-10-CM | POA: Diagnosis not present

## 2015-05-22 DIAGNOSIS — E119 Type 2 diabetes mellitus without complications: Secondary | ICD-10-CM | POA: Insufficient documentation

## 2015-05-22 DIAGNOSIS — I10 Essential (primary) hypertension: Secondary | ICD-10-CM | POA: Diagnosis not present

## 2015-05-22 DIAGNOSIS — M7989 Other specified soft tissue disorders: Secondary | ICD-10-CM

## 2015-05-22 DIAGNOSIS — E785 Hyperlipidemia, unspecified: Secondary | ICD-10-CM | POA: Insufficient documentation

## 2015-05-22 DIAGNOSIS — N529 Male erectile dysfunction, unspecified: Secondary | ICD-10-CM | POA: Diagnosis not present

## 2015-05-22 DIAGNOSIS — Z6841 Body Mass Index (BMI) 40.0 and over, adult: Secondary | ICD-10-CM | POA: Diagnosis not present

## 2015-05-22 DIAGNOSIS — I2510 Atherosclerotic heart disease of native coronary artery without angina pectoris: Secondary | ICD-10-CM | POA: Insufficient documentation

## 2015-05-22 DIAGNOSIS — M79605 Pain in left leg: Secondary | ICD-10-CM | POA: Insufficient documentation

## 2015-05-22 NOTE — Progress Notes (Signed)
VASCULAR LAB PRELIMINARY  PRELIMINARY  PRELIMINARY  PRELIMINARY  Left lower extremity venous duplex completed.    Preliminary report:  Left:  No evidence of DVT, superficial thrombosis, or Baker's cyst.  Cestone,Helene, RVT 05/22/2015, 8:20 AM

## 2015-05-25 ENCOUNTER — Ambulatory Visit (INDEPENDENT_AMBULATORY_CARE_PROVIDER_SITE_OTHER): Payer: Medicare Other | Admitting: Internal Medicine

## 2015-05-25 ENCOUNTER — Encounter: Payer: Self-pay | Admitting: Internal Medicine

## 2015-05-25 VITALS — BP 122/66 | HR 51 | Temp 97.9°F | Ht 71.0 in | Wt 290.2 lb

## 2015-05-25 DIAGNOSIS — M25562 Pain in left knee: Secondary | ICD-10-CM

## 2015-05-25 DIAGNOSIS — M25569 Pain in unspecified knee: Secondary | ICD-10-CM

## 2015-05-25 NOTE — Patient Instructions (Signed)
Thank you for your visit today The [facility]opedics Clinic will call to make appt with them- please follow up with them.  Have a wonderful day !

## 2015-05-25 NOTE — Assessment & Plan Note (Signed)
Pt fell on left knee last Thursday, continual pain. Had extensive surgeries on that knee in the past. Pain 3/10- takes percoset. He went to the ER on 9/17 and had further evaluation. Xrays of left knee and hip did not reveal fracture, but they recommended orthopedics referral. Doppler was done which ruled out DVT  Exam- no signs of erythema, warmth or tenderness, limited ROM.  Plan- refer to orthopaedics

## 2015-05-25 NOTE — Progress Notes (Signed)
Internal Medicine Clinic Attending  I saw and evaluated the patient.  I personally confirmed the key portions of the history and exam documented by Dr. Saraiya and I reviewed pertinent patient test results.  The assessment, diagnosis, and plan were formulated together and I agree with the documentation in the resident's note.  

## 2015-05-25 NOTE — Progress Notes (Signed)
Patient ID: Juan Calderon, male   DOB: Aug 05, 1956, 59 y.o.   MRN: [id number]    Subjective:   Patient ID: Juan Calderon male   DOB: 06/01/1956 59 y.o.   MRN: [id number]  HPI: Juan Calderon is a 59 y.o. man with PMH noted below here for knee pain and referral to orthopaedics     Past Medical History  Diagnosis Date  . Essential hypertension    . Persistent atrial fibrillation    . Chronic diastolic heart failure      with mild left ventricular hypertrophy on Echo 02/2010  . Chronic pain syndrome      Left arm and leg s/p traumatic injury   . Osteoarthritis cervical spine    . Gastroesophageal reflux disease    . Open-angle glaucoma    . 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    . Hyperlipidemia LDL goal < 100    . Coronary artery disease      25% LAD stenosis on cath 2007.  Stable angina.  . Gout    . Right rotator cuff tear      Large full-thickness tear of the supraspinatus with mild retraction but no atrophy   . Obstructive sleep apnea      Moderate, AHI 29.8 per hour with moderately loud snoring and oxygen desaturation to a nadir of 79%. CPAP titration resulted in a prescription for 17 CWP.    [name] Cataract     Left eye  . Morbid obesity with BMI of 40.0-44.9, adult    . Chronic obstructive pulmonary disease    . Diverticulosis    . Osteoarthritis of left knee 06/19/2013    Tricompartmental disease.  Treated with double hinged upright knee brace, steroid/xylocaine knee injections, and NSAIDs   . Chronic renal insufficiency    . Alcohol abuse    . Subclinical hypothyroidism    . Normocytic anemia     Current Outpatient Prescriptions  Medication Sig Dispense Refill  . allopurinol (ZYLOPRIM) 300 MG tablet Take 1 tablet (300 mg total) by mouth daily. 90 tablet 3  . atorvastatin (LIPITOR) 10 MG tablet Take 1 tablet (10 mg total) by mouth daily. 90 tablet 3  . esomeprazole (NEXIUM) 40 MG capsule Take 1 capsule (40 mg total) by mouth  daily. 90 capsule 3  . fenofibrate 160 MG tablet Take 1 tablet (160 mg total) by mouth daily. 90 tablet 3  . flecainide (TAMBOCOR) 50 MG tablet Take 1 tablet (50 mg total) by mouth every 12 (twelve) hours. 180 tablet 3  . latanoprost (XALATAN) 0.005 % ophthalmic solution Place 1 drop into both eyes at bedtime. 7.5 mL 3  . metoprolol tartrate (LOPRESSOR) 25 MG tablet Take 1 tablet (25 mg total) by mouth 2 (two) times daily. 180 tablet 3  . nitroGLYCERIN (NITROSTAT) 0.4 MG SL tablet Place 0.4 mg under the tongue every 5 (five) minutes as needed for chest pain.    [name] oxyCODONE-acetaminophen (PERCOCET) 10-325 MG per tablet Take 1 tablet by mouth every 6 (six) hours as needed for pain. 120 tablet 0  . sorbitol 70 % solution Take 15-60 mLs by mouth daily as needed (mix in coffee, juice, or water). 473 mL 11  . sulindac (CLINORIL) 200 MG tablet Take 200 mg by mouth 2 (two) times daily as needed.  3  . terazosin (HYTRIN) 5 MG capsule Take 1 capsule (5 mg total) by mouth at bedtime. 90 capsule 3  . torsemide (DEMADEX) 20 MG  tablet Take 2 tablets (40 mg total) by mouth 2 (two) times a week. 12 tablet 6  . torsemide (DEMADEX) 20 MG tablet Take 2 tablets (40 mg total) by mouth 3 days. 6 tablet 0  . warfarin (COUMADIN) 5 MG tablet Take 1/2 tablet (2.5 mg) by mouth every day except on Sundays take 1 tablet (5 mg) 30 tablet 1   No current facility-administered medications for this visit.   Family History  Problem Relation Age of Onset  . Heart failure Mother   . Alzheimer's disease Father   . Healthy Sister   . Healthy Brother   . Healthy Son   . Healthy Sister   . Healthy Sister   . Healthy Sister   . Healthy Sister   . Healthy Brother   . Healthy Brother   . Healthy Brother   . Heart failure Brother   . Healthy Brother   . Osteoarthritis Brother   . Prostate cancer Brother   . Early death Brother     Manufacturing systems engineer  . Heart attack Neg Hx   . Stroke Neg Hx   . Hypertension Sister   .  Hypertension Brother    Social History   Social History  . Marital Status: Widowed    Spouse Name: N/A  . Number of Children: N/A  . Years of Education: N/A   Social History Main Topics  . Smoking status: Never Smoker   . Smokeless tobacco: Never Used  . Alcohol Use: 1.0 oz/week    2 drink(s) per week     Comment: Liquor.  . Drug Use: No  . Sexual Activity: Yes    Birth Control/ Protection: None   Other Topics Concern  . None   Social History Narrative   Review of Systems: Review of Systems  Constitutional: Negative.   Respiratory: Negative.   Cardiovascular: Negative.   Gastrointestinal: Negative.   Musculoskeletal: Positive for joint pain. Negative for myalgias and neck pain.   Objective:  Physical Exam: Filed Vitals:   05/25/15 1412  BP: 122/66  Pulse: 51  Temp: 97.9 F (36.6 C)  TempSrc: Oral  Height: 5\' 11"  (1.803 m)  Weight: 290 lb 3.2 oz (131.634 kg)  SpO2: 92%   Physical Exam  Constitutional: He appears well-developed and well-nourished.  obese  HENT:  Head: Normocephalic.  Eyes: EOM are normal.  Neck: Normal range of motion.  Cardiovascular: Normal rate, regular rhythm, normal heart sounds and intact distal pulses.   No murmur heard. Pulmonary/Chest: Effort normal and breath sounds normal.  Musculoskeletal: Normal range of motion. He exhibits no edema.  LLE- extensive scarring from old surgeries in 85s. No erythema or warmth, but does have thickened skin on calf. Popliteal, dp, and pt pulses intact   Skin: Skin is warm.  Psychiatric: He has a normal mood and affect.  no tenderness to palpation, wears compression stockings  Assessment & Plan:  Please see problem based charting for assessment and plan

## 2015-05-26 ENCOUNTER — Telehealth: Payer: Self-pay | Admitting: *Deleted

## 2015-05-26 NOTE — Telephone Encounter (Signed)
Pt seen in [facility] yesterday-on the way the door, pt stated that he had gotten a letter stating that his insurance will no longer be paying for his "pain medicine" and he needed our office to call in something different.  I contacted pt's pharmacy to confirm which medication needed prior authorization, but according to the pharmacist, a PA is not needed at this time but he will let our office know if one is needed. Pt informed and instructed to call me back with the information on the letter he received, but cant find a the moment.[name]9/22/20164:06 PM

## 2015-05-27 ENCOUNTER — Ambulatory Visit (INDEPENDENT_AMBULATORY_CARE_PROVIDER_SITE_OTHER): Payer: Medicare Other

## 2015-05-27 DIAGNOSIS — Z5181 Encounter for therapeutic drug level monitoring: Secondary | ICD-10-CM | POA: Diagnosis not present

## 2015-05-27 DIAGNOSIS — I4891 Unspecified atrial fibrillation: Secondary | ICD-10-CM | POA: Diagnosis not present

## 2015-05-27 DIAGNOSIS — Z7901 Long term (current) use of anticoagulants: Secondary | ICD-10-CM

## 2015-05-27 LAB — POCT INR: INR: 2

## 2015-05-30 DIAGNOSIS — M1712 Unilateral primary osteoarthritis, left knee: Secondary | ICD-10-CM | POA: Diagnosis not present

## 2015-06-07 DIAGNOSIS — M79652 Pain in left thigh: Secondary | ICD-10-CM | POA: Diagnosis not present

## 2015-06-09 DIAGNOSIS — M1712 Unilateral primary osteoarthritis, left knee: Secondary | ICD-10-CM | POA: Diagnosis not present

## 2015-06-10 ENCOUNTER — Encounter: Payer: Self-pay | Admitting: Internal Medicine

## 2015-06-10 ENCOUNTER — Ambulatory Visit (INDEPENDENT_AMBULATORY_CARE_PROVIDER_SITE_OTHER): Payer: Medicare Other | Admitting: Internal Medicine

## 2015-06-10 ENCOUNTER — Ambulatory Visit (HOSPITAL_COMMUNITY)
Admission: RE | Admit: 2015-06-10 | Discharge: 2015-06-10 | Disposition: A | Discharge status: Home / Self Care | Payer: Medicare Other | Source: Ambulatory Visit | Attending: Internal Medicine | Admitting: Internal Medicine

## 2015-06-10 VITALS — BP 142/81 | HR 50 | Temp 98.7°F | Wt 290.4 lb

## 2015-06-10 DIAGNOSIS — T402X5A Adverse effect of other opioids, initial encounter: Secondary | ICD-10-CM

## 2015-06-10 DIAGNOSIS — Z7901 Long term (current) use of anticoagulants: Secondary | ICD-10-CM

## 2015-06-10 DIAGNOSIS — I4891 Unspecified atrial fibrillation: Secondary | ICD-10-CM | POA: Insufficient documentation

## 2015-06-10 DIAGNOSIS — M1732 Unilateral post-traumatic osteoarthritis, left knee: Secondary | ICD-10-CM

## 2015-06-10 DIAGNOSIS — I25118 Atherosclerotic heart disease of native coronary artery with other forms of angina pectoris: Secondary | ICD-10-CM | POA: Diagnosis not present

## 2015-06-10 DIAGNOSIS — E1149 Type 2 diabetes mellitus with other diabetic neurological complication: Secondary | ICD-10-CM | POA: Diagnosis not present

## 2015-06-10 DIAGNOSIS — T1490 Injury, unspecified: Secondary | ICD-10-CM

## 2015-06-10 DIAGNOSIS — T402X5D Adverse effect of other opioids, subsequent encounter: Secondary | ICD-10-CM

## 2015-06-10 DIAGNOSIS — I5032 Chronic diastolic (congestive) heart failure: Secondary | ICD-10-CM

## 2015-06-10 DIAGNOSIS — E1140 Type 2 diabetes mellitus with diabetic neuropathy, unspecified: Secondary | ICD-10-CM

## 2015-06-10 DIAGNOSIS — K5903 Drug induced constipation: Secondary | ICD-10-CM

## 2015-06-10 DIAGNOSIS — I25119 Atherosclerotic heart disease of native coronary artery with unspecified angina pectoris: Secondary | ICD-10-CM

## 2015-06-10 DIAGNOSIS — E039 Hypothyroidism, unspecified: Secondary | ICD-10-CM

## 2015-06-10 DIAGNOSIS — G4733 Obstructive sleep apnea (adult) (pediatric): Secondary | ICD-10-CM | POA: Diagnosis not present

## 2015-06-10 DIAGNOSIS — E038 Other specified hypothyroidism: Secondary | ICD-10-CM

## 2015-06-10 DIAGNOSIS — Z9889 Other specified postprocedural states: Secondary | ICD-10-CM | POA: Diagnosis not present

## 2015-06-10 DIAGNOSIS — X58XXXS Exposure to other specified factors, sequela: Secondary | ICD-10-CM

## 2015-06-10 DIAGNOSIS — I110 Hypertensive heart disease with heart failure: Secondary | ICD-10-CM

## 2015-06-10 DIAGNOSIS — N521 Erectile dysfunction due to diseases classified elsewhere: Secondary | ICD-10-CM | POA: Diagnosis not present

## 2015-06-10 DIAGNOSIS — G894 Chronic pain syndrome: Secondary | ICD-10-CM

## 2015-06-10 DIAGNOSIS — E785 Hyperlipidemia, unspecified: Secondary | ICD-10-CM

## 2015-06-10 DIAGNOSIS — I10 Essential (primary) hypertension: Secondary | ICD-10-CM

## 2015-06-10 LAB — POCT GLYCOSYLATED HEMOGLOBIN (HGB A1C): HEMOGLOBIN A1C: 6.8

## 2015-06-10 LAB — GLUCOSE, CAPILLARY: GLUCOSE-CAPILLARY: 150 mg/dL — AB (ref 65–99)

## 2015-06-10 MED ORDER — NITROGLYCERIN 0.4 MG SL SUBL: 0.4000 mg | SUBLINGUAL_TABLET | SUBLINGUAL | Status: DC | PRN

## 2015-06-10 MED ORDER — ATORVASTATIN CALCIUM 10 MG PO TABS: 10.0000 mg | ORAL_TABLET | Freq: Every day | ORAL | Status: DC

## 2015-06-10 MED ORDER — OXYCODONE-ACETAMINOPHEN 10-325 MG PO TABS: 1.0000 | ORAL_TABLET | Freq: Four times a day (QID) | ORAL | Status: DC | PRN

## 2015-06-10 NOTE — Assessment & Plan Note (Signed)
His opiod associated constipation has responded well symptomatically to this sorbitol 70% as needed. This will therefore be continued. We will reassess his constipation symptoms at the follow-up visit.

## 2015-06-10 NOTE — Progress Notes (Signed)
   Subjective:    Patient ID: Juan Calderon, male    DOB: 02/13/1956, 59 y.o.   MRN: [id number]  HPI  [name] is here for evaluation of his atrial fibrillation, discuss options other than CPAP for his OSA, and to evaluate his diabetes, hypertension, and chronic dyspnea. Please see the A&P for the status of the pt's chronic medical problems.  Review of Systems  Constitutional: Negative for activity change, appetite change and unexpected weight change.  Respiratory: Negative for chest tightness and shortness of breath.   Cardiovascular: Positive for leg swelling. Negative for chest pain and palpitations.       L > R, worse on left recently with the onset of left thigh pain.  Gastrointestinal: Positive for abdominal distention. Negative for nausea, vomiting, abdominal pain, diarrhea and constipation.  Musculoskeletal: Positive for myalgias, arthralgias and gait problem.  Neurological: Negative for dizziness, syncope and light-headedness.      Objective:   Physical Exam  Constitutional: He is oriented to person, place, and time. He appears well-developed and well-nourished.  HENT:  Head: Normocephalic and atraumatic.  Eyes: Conjunctivae are normal. Right eye exhibits no discharge. Left eye exhibits no discharge. No scleral icterus.  Cardiovascular: Normal rate, regular rhythm and normal heart sounds.  Exam reveals no gallop and no friction rub.   No murmur heard. Pulmonary/Chest: Effort normal and breath sounds normal. No respiratory distress. He has no wheezes. He has no rales.  Abdominal: Soft. Bowel sounds are normal. He exhibits no distension. There is no tenderness. There is no rebound and no guarding.  Musculoskeletal: Normal range of motion. He exhibits edema.  Neurological: He is alert and oriented to person, place, and time. He exhibits normal muscle tone.  Skin: Skin is warm and dry. No rash noted. He is not diaphoretic. No erythema.  Psychiatric: He has a normal mood and  affect. His behavior is normal. Judgment and thought content normal.  Nursing note and vitals reviewed.     Assessment & Plan:   Please see problem oriented charting.

## 2015-06-10 NOTE — Assessment & Plan Note (Addendum)
He has been having difficulty with his left thigh secondary to increasing pain and lower extremity edema. He was seen by orthopedic surgery and they ordered an MRI which apparently showed a lesion. We have contacted Dr. Luanna Cole office in order to get his progress note and report of the MRI for our records. The patient tells me he is being referred to [facility] for evaluation of this bone lesion. We will know more once we can get the results of the MRI and the progress notes.  Otherwise his pain has been controlled with the Percocet 10-325 mg 1 tablet every 6 hours as needed for pain. A UDS was obtained during this visit and is pending at the time of this dictation. He tells me his last dose of the Percocet was this morning. I reviewed his medication and the bottle with the Percocet was appropriately labeled and recently filled at a [facility] in [place]. Unfortunately, they apparently have not been entering Mr. Oliveri into the narcotic database as there are no reports of narcotics being filled for Mr. Sessums when the database was reviewed this morning. As I was able to evaluate the bottle and label I know he has been receiving it from a [place] [facility]. Therefore, the lack of him showing up in the [facility] narcotic database is not because he is not filling the medication, I am confident it is because the [facility] is not reporting the narcotic prescription dispensing.

## 2015-06-10 NOTE — Assessment & Plan Note (Signed)
Since cardioversion to normal sinus rhythm he has noted improvement in his dyspnea on exertion. This was what was hoped for as it was thought that he relied upon his atrial kick to some degree. Now that it has been reestablished his breathing has improved. In addition, he's recently been aggressively diuresed and this has also likely helped his dyspnea. His weight is now 290 pounds up from 283 pounds at discharge. When released from the hospital he was felt to be still a little on the dry side. Therefore, his ideal weight may be in the 288-290 range. An ECG done today revealed normal sinus bradycardia at 52 bpm and was otherwise without significant changes compared to the previous ECG on 04/21/2015. He is currently on flecainide 50 mg by mouth twice daily, metoprolol 25 mg by mouth twice daily and warfarin for stroke prophylaxis. We will continue the flecanide at 50 mg by mouth twice daily and metoprolol 25 mg by mouth twice daily. He is being followed in the anticoagulation clinic for the management of his warfarin. We will reassess his rhythm and dyspnea at the follow-up visit.

## 2015-06-10 NOTE — Assessment & Plan Note (Signed)
His dyspnea is actually better now than it has been in quite some time. Some of this is related to his atrial kick that has been reestablished. Some of this is related to his recent aggressive diuresis. We will therefore continue the flecanide at 50 mg by mouth twice daily in order to maintain normal sinus rhythm. We are also treating his heart failure with metoprolol 25 mg by mouth twice daily, and torsemide 20 mg by mouth 2 times a week and as needed. This will be continued. If his renal function has returned to baseline we will restart the valsartan at 320 mg by mouth daily. The results of the basic metabolic panel are pending at the time of this dictation.

## 2015-06-10 NOTE — Assessment & Plan Note (Signed)
He was unable to tolerate the CPAP mask. Therefore his moderate obstructive sleep apnea has gone untreated. Cardiology is rightfully concerned about this as it increases the likelihood that he will flip it back into atrial fibrillation. As it does not appear that nasal CPAP as something that is tolerated, I brought up the possibility of using an oral appliance. He stated he would be interested in learning more and trying this therapy out. I therefore will contact one of the sleep physicians in town to see if there is anyone he uses that might accept Medicare where I could send Mr. Gowans for fitting of an oral appliance. I will place this referral if I can find a practice that will take Medicare.

## 2015-06-10 NOTE — Assessment & Plan Note (Signed)
His blood pressure was slightly elevated today at 142/81. This is on metoprolol 25 mg by mouth twice daily and terazosin 5 mg by mouth every night. During his recent hospitalization the valsartan 320 mg by mouth daily was held because of acute on chronic renal insufficiency. A basic metabolic panel was obtained today to reassess his kidney function. If it is at or near baseline we will reinstitute the valsartan at 320 mg by mouth daily for his blood pressure and diastolic heart failure.

## 2015-06-10 NOTE — Assessment & Plan Note (Signed)
At the last admission he was found to have a slightly elevated TSH just over 5. This could represent subclinical hypothyroidism or sick euthyroid. Now that he has return to his baseline health a repeat TSH was drawn today to reassess. The result is pending at the time of this dictation.

## 2015-06-10 NOTE — Patient Instructions (Signed)
It was good to see you again.  1) I checked your kidney function and thyroid function today.  I will call you early next week if there is anything to worry about or that needs addressed.  2) We gave you the flu sot today.  3) I stopped the fenofibrate today to see if this helps with your bloating.  4) I will look into where I can send you for evaluation of the oral prosthetic for the sleep apnea.  5) Keep taking your other medications as you are.  I will see you in 3 months, sooner if necessary.

## 2015-06-10 NOTE — Assessment & Plan Note (Addendum)
His diabetes was better controlled today with a hemoglobin A1c of 6.8. Some of this may be may be related to weight loss. He currently is being managed with diet alone. He was praised on the success of lowering his hemoglobin A1c over the last several months to the target range. He was encouraged to continue with his diet therapy. We will reassess the diabetic control at the follow-up visit with a repeat hemoglobin A1c.  With regards to his erectile dysfunction it is still been problematic as he could not afford several of the medications prescribed for erectile dysfunction. In addition, when he did try them they were ineffective. Finally, some of them are contraindicated given his nitrate therapy for his chronic stable angina. He states this is now causing some strain within his relationship and would like more definitive therapy. He has tried the penile pump without success. I will therefore refer him to urology for assessment of non-oral pharmacologic or alternative therapies for his erectile dysfunction.

## 2015-06-10 NOTE — Assessment & Plan Note (Signed)
He has had no recent anginal pain but his nitroglycerin has expired and he is asking for a refill. This was therefore provided. We are continuing the atorvastatin at 10 mg by mouth daily, metoprolol at 25 mg by mouth twice daily, and as needed sublingual nitroglycerin. We will reassess for symptoms of angina at the follow-up visit.

## 2015-06-11 LAB — BMP8+ANION GAP
Anion Gap: 19 mmol/L — ABNORMAL HIGH (ref 10.0–18.0)
BUN / CREAT RATIO: 13 (ref 10–22)
BUN: 12 mg/dL (ref 8–27)
CALCIUM: 8.9 mg/dL (ref 8.6–10.2)
CHLORIDE: 98 mmol/L (ref 97–108)
CO2: 28 mmol/L (ref 18–29)
Creatinine, Ser: 0.91 mg/dL (ref 0.76–1.27)
GFR, EST AFRICAN AMERICAN: 106 mL/min/{1.73_m2} (ref >59)
GFR, EST NON AFRICAN AMERICAN: 91 mL/min/{1.73_m2} (ref >59)
GLUCOSE: 159 mg/dL — AB (ref 65–99)
POTASSIUM: 3.5 mmol/L (ref 3.5–5.2)
SODIUM: 145 mmol/L — AB (ref 134–144)

## 2015-06-11 LAB — TSH: TSH: 3.36 u[IU]/mL (ref 0.450–4.500)

## 2015-06-13 ENCOUNTER — Telehealth: Payer: Self-pay | Admitting: *Deleted

## 2015-06-13 NOTE — Progress Notes (Signed)
Chief Complaint  Patient presents with  . Shortness of Breath    History of Present Illness: 59 yo male with history of diastolic CHF, CAD, persistent AFib, COPD, HTN, HLD, gout, GERD who is here today for cardiac follow up. He lives here and spends time with family in [place], [place] ([place] He had been followed by Dr. [name] At [facility] in [place], [place]. Echo 6/11: mild LVH, EF of 55%. mild LAE. Monitor April 2012 in [place], [place] showed NSR with episodes of sinus brady, rare PVCs, occasional PACs and brief episodes of atrial tachycardia. Per records he has been on Multaq in the past but did not like this medication so it was stopped. He had a stress myoview 09/14/05 that showed reversible ischemia in the inferior wall. This led to a cardiac cath on 10/01/05 which showed 25% mid LAD stenosis per report but no other evidence of CAD.  Admitted October 2015 with volume overload and echo showed normal systolic function EF 47%. He diuresed down to his dry weight of 304 pounds. Metoprolol was decreased to 50 mg twice a day because of nocturnal bradycardia with rates in the 30s. Digoxin was discontinued. He was seen in March 2016 and had c/o dyspnea on exertion. Stress myoview 11/25/14 with no ischemia. 24 hour monitor showed atrial fib with bradycardia (rates as low as 40 bpm), frequent PVCs (9000 in 24 hours) and non-sustained VT. He was seen by Dr. [name] 12/23/14 and was started on Flecainide. He underwent DCCV on 12/30/14. His Lasix was changed to Torsemide. He was admitted to [facility] June 2016 with renal failure felt to be due to over-diuresis with torsemide. Echo May 2016 with LVEF=55-60%. He was seen by Dr. [name] August 2016 and his Lasix was restarted. He has been continued on Flecainide. He tried CPAP but did not tolerate.   He is here today for follow up.  No chest pain.  He continues to have dyspnea but this has improved since sinus was restored. He is not aware of palpitations. Weight  is stable.   Primary Care Physician:  Eppie Gibson  Past Medical History  Diagnosis Date  . Essential hypertension    . Persistent atrial fibrillation ([place])    . Chronic diastolic heart failure ([place])      with mild left ventricular hypertrophy on Echo 02/2010  . Chronic pain syndrome      Left arm and leg s/p traumatic injury   . Osteoarthritis cervical spine    . Gastroesophageal reflux disease    . Open-angle glaucoma    . 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 (HCC)    . Hyperlipidemia LDL goal < 100    . Coronary artery disease      25% LAD stenosis on cath 2007.  Stable angina.  . Gout    . Right rotator cuff tear      Large full-thickness tear of the supraspinatus with mild retraction but no atrophy   . Obstructive sleep apnea      Moderate, AHI 29.8 per hour with moderately loud snoring and oxygen desaturation to a nadir of 79%. CPAP titration resulted in a prescription for 17 CWP.    [name] Cataract     Left eye  . Morbid obesity with BMI of 40.0-44.9, adult ([place])    . Chronic obstructive pulmonary disease ([place])    . Diverticulosis    . Osteoarthritis of left knee 06/19/2013    Tricompartmental disease.  Treated with double  hinged upright knee brace, steroid/xylocaine knee injections, and NSAIDs   . Chronic renal insufficiency    . Alcohol abuse    . Subclinical hypothyroidism    . Normocytic anemia      Past Surgical History  Procedure Laterality Date  . Left leg surgery    . Left arm surgery    . Shoulder surgery      Right  . Fracture surgery Left 1980's    Elbow  . Cardioversion N/A 12/30/2014    Procedure: CARDIOVERSION;  Surgeon: [name], MD;  Location: [facility] ENDOSCOPY;  Service: Cardiovascular;  Laterality: N/A;    Current Outpatient Prescriptions  Medication Sig Dispense Refill  . allopurinol (ZYLOPRIM) 300 MG tablet Take 1 tablet (300 mg total) by mouth daily. 90 tablet 3  . atorvastatin (LIPITOR) 10 MG tablet Take 1 tablet  (10 mg total) by mouth daily. 90 tablet 3  . esomeprazole (NEXIUM) 40 MG capsule Take 1 capsule (40 mg total) by mouth daily. 90 capsule 3  . flecainide (TAMBOCOR) 50 MG tablet Take 1 tablet (50 mg total) by mouth every 12 (twelve) hours. 180 tablet 3  . latanoprost (XALATAN) 0.005 % ophthalmic solution Place 1 drop into both eyes at bedtime. 7.5 mL 3  . metoprolol tartrate (LOPRESSOR) 25 MG tablet Take 1 tablet (25 mg total) by mouth 2 (two) times daily. 180 tablet 3  . nitroGLYCERIN (NITROSTAT) 0.4 MG SL tablet Place 1 tablet (0.4 mg total) under the tongue every 5 (five) minutes as needed for chest pain. 30 tablet 11  . oxyCODONE-acetaminophen (PERCOCET) 10-325 MG tablet Take 1 tablet by mouth every 6 (six) hours as needed for pain. 120 tablet 0  . sorbitol 70 % solution Take 15-60 mLs by mouth daily as needed (mix in coffee, juice, or water). 473 mL 11  . terazosin (HYTRIN) 5 MG capsule Take 1 capsule (5 mg total) by mouth at bedtime. 90 capsule 3  . torsemide (DEMADEX) 20 MG tablet Take 2 tablets (40 mg total) by mouth 2 (two) times a week. 12 tablet 6  . warfarin (COUMADIN) 5 MG tablet Take 1/2 tablet (2.5 mg) by mouth every day except on Sundays take 1 tablet (5 mg) 30 tablet 1   No current facility-administered medications for this visit.    Allergies  Allergen Reactions  . Ramipril Swelling  . Testosterone Rash    Social History   Social History  . Marital Status: Widowed    Spouse Name: N/A  . Number of Children: N/A  . Years of Education: N/A   Occupational History  . Not on file.   Social History Main Topics  . Smoking status: Never Smoker   . Smokeless tobacco: Never Used  . Alcohol Use: 1.0 oz/week    2 drink(s) per week     Comment: Liquor.  . Drug Use: No  . Sexual Activity: Yes    Birth Control/ Protection: None   Other Topics Concern  . Not on file   Social History Narrative    Family History  Problem Relation Age of Onset  . Heart failure Mother     . Alzheimer's disease Father   . Healthy Sister   . Healthy Brother   . Healthy Son   . Healthy Sister   . Healthy Sister   . Healthy Sister   . Healthy Sister   . Healthy Brother   . Healthy Brother   . Healthy Brother   . Heart failure Brother   .  Healthy Brother   . Osteoarthritis Brother   . Prostate cancer Brother   . Early death Brother     Manufacturing systems engineer  . Heart attack Neg Hx   . Stroke Neg Hx   . Hypertension Sister   . Hypertension Brother     Review of Systems:  As stated in the HPI and otherwise negative.   BP 140/70 mmHg  Pulse 56  Ht 5\' 11"  (1.803 m)  Wt 293 lb 1.9 oz (132.958 kg)  BMI 40.90 kg/m2  SpO2 96%  Physical Examination: General: Well developed, well nourished, NAD HEENT: OP clear, mucus membranes moist SKIN: warm, dry. No rashes. Neuro: No focal deficits Musculoskeletal: Muscle strength 5/5 all ext Psychiatric: Mood and affect normal Neck: No JVD, no carotid bruits, no thyromegaly, no lymphadenopathy. Lungs:Clear bilaterally, no wheezes, rhonci, crackles Cardiovascular: Irreg irreg rate and rhythm. No murmurs, gallops or rubs. Abdomen:Soft. Bowel sounds present. Non-tender.  Extremities: No lower extremity edema. Pulses are 2 + in the bilateral DP/PT.  Stress myoview 11/25/14: QPS Raw Data Images: Acquisition technically good; LVE. Stress Images: Normal homogeneous uptake in all areas of the myocardium. Rest Images: Normal homogeneous uptake in all areas of the myocardium. Subtraction (SDS): No evidence of ischemia. Transient Ischemic Dilatation (Normal <1.22): 1.15 Lung/Heart Ratio (Normal <0.45): 0.20 Impression Exercise Capacity: Lexiscan with no exercise. BP Response: Normal blood pressure response. Clinical Symptoms: There is dyspnea and chest pressure ECG Impression: No significant ST segment change suggestive of ischemia. Comparison with Prior Nuclear Study: Compared to 05/10/12, no change. Overall Impression: Normal  stress nuclear study. LV Ejection Fraction:Study not gated. . LV Wall Motion: Study not gated due to atrial fibrillation; there appears to be significant LVE.  Echo May 2016: Left ventricle: LVEF is approximately 55 to 60% The cavity size was normal. Wall thickness was increased in a pattern of mild LVH. - Pulmonary arteries: PA peak pressure: 39 mm Hg (S).  EKG:  EKG is not ordered today. The ekg ordered today demonstrates  Recent Labs: 06/18/2014: Pro B Natriuretic peptide (BNP) 1148.0* 09/26/2014: B Natriuretic Peptide 119.2* 02/28/2015: ALT 31; Magnesium 1.6* 03/09/2015: Hemoglobin 11.3*; Platelets 196 06/10/2015: BUN 12; Creatinine, Ser 0.91; Potassium 3.5; Sodium 145*; TSH 3.360   Lipid Panel    Component Value Date/Time   CHOL 108 08/13/2014 1046   TRIG 93 08/13/2014 1046   HDL 55 08/13/2014 1046   CHOLHDL 2.0 08/13/2014 1046   VLDL 19 08/13/2014 1046   LDLCALC 34 08/13/2014 1046     Wt Readings from Last 3 Encounters:  06/14/15 293 lb 1.9 oz (132.958 kg)  06/10/15 290 lb 6.4 oz (131.725 kg)  05/25/15 290 lb 3.2 oz (131.634 kg)     Other studies Reviewed: Additional studies/ records that were reviewed today include: Holter monitor and stress myoview Review of the above records demonstrates:    Assessment and Plan:   1. Chronic Diastolic CHF: Weight is stable. Swelling is the same in lower extremities. Continue torsemide twice weekly. We have attempted more diuresis in the past but he develops renal failure quickly.   2. Atrial Fibrillation: He remains anti-coagulated on Coumadin. He is maintaining sinus with Flecaidine. He is also on metoprolol.   3. Hypertension: BP controlled. Continue current meds.   4. CAD: Stable. He is not on an ASA since he has been on coumadin. Continue statin, beta blocker. Recent stress myoview march 2016 without ischemia.   5. Chronic kidney disease: stable last check last week.  6. Dyspnea:  Multifactorial. He is 150 lbs heavier  than he was at age 26. His morbid obesity most certainly is contributing to his dyspnea. He is deconditioned. He has underlying lung disease with PFTs in 2014 showing severe obstructive and severe restrictive airways disease. This has improved since he has been back in sinus.   Current medicines are reviewed at length with the patient today.  The patient does not have concerns regarding medicines.  The following changes have been made:  no change  Labs/ tests ordered today include:  No orders of the defined types were placed in this encounter.    Disposition:   FU with me in 12 months    Signed, Lauree Chandler, MD 06/14/2015 9:38 AM    [facility] [address], [place], [place]  [address] Phone: [phone]; Fax: [phone]

## 2015-06-13 NOTE — Telephone Encounter (Signed)
Call made to [facility] to inquire about how to write order for a lift chair.  Pt was informed at last visit, that insurance may cover the lift mechanism, but not the cost of the chair.  Call transferred to the [facility] location), but rep was busy with a client.  Contact info left-will await call back from store.[name]10/10/20162:20 PM

## 2015-06-14 ENCOUNTER — Ambulatory Visit (INDEPENDENT_AMBULATORY_CARE_PROVIDER_SITE_OTHER): Payer: Medicare Other | Admitting: *Deleted

## 2015-06-14 ENCOUNTER — Encounter: Payer: Self-pay | Admitting: Cardiovascular Disease

## 2015-06-14 ENCOUNTER — Ambulatory Visit (INDEPENDENT_AMBULATORY_CARE_PROVIDER_SITE_OTHER): Payer: Medicare Other | Admitting: Cardiovascular Disease

## 2015-06-14 VITALS — BP 140/70 | HR 56 | Ht 71.0 in | Wt 293.1 lb

## 2015-06-14 DIAGNOSIS — R0600 Dyspnea, unspecified: Secondary | ICD-10-CM

## 2015-06-14 DIAGNOSIS — I4891 Unspecified atrial fibrillation: Secondary | ICD-10-CM

## 2015-06-14 DIAGNOSIS — Z5181 Encounter for therapeutic drug level monitoring: Secondary | ICD-10-CM

## 2015-06-14 DIAGNOSIS — I2510 Atherosclerotic heart disease of native coronary artery without angina pectoris: Secondary | ICD-10-CM | POA: Diagnosis not present

## 2015-06-14 DIAGNOSIS — I480 Paroxysmal atrial fibrillation: Secondary | ICD-10-CM | POA: Diagnosis not present

## 2015-06-14 DIAGNOSIS — I10 Essential (primary) hypertension: Secondary | ICD-10-CM

## 2015-06-14 DIAGNOSIS — I5032 Chronic diastolic (congestive) heart failure: Secondary | ICD-10-CM | POA: Diagnosis not present

## 2015-06-14 DIAGNOSIS — Z7901 Long term (current) use of anticoagulants: Secondary | ICD-10-CM

## 2015-06-14 LAB — POCT INR: INR: 2.2

## 2015-06-14 NOTE — Patient Instructions (Signed)

## 2015-06-18 LAB — PRESCRIPTION ABUSE MONITORING 17P, URINE
6-Acetylmorphine, Urine: NEGATIVE ng/mL
Amphetamine Scrn, Ur: NEGATIVE ng/mL
BARBITURATE SCREEN URINE: NEGATIVE ng/mL
BENZODIAZEPINE SCREEN, URINE: NEGATIVE ng/mL
BUPRENORPHINE, URINE: NEGATIVE ng/mL
CANNABINOIDS UR QL SCN: NEGATIVE ng/mL
CARISOPRODOL/MEPROBAMATE, UR: NEGATIVE ng/mL
COCAINE(METAB.)SCREEN, URINE: NEGATIVE ng/mL
Creatinine(Crt), U: 273 mg/dL (ref 20.0–300.0)
EDDP, Urine: NEGATIVE ng/mL
FENTANYL, URINE: NEGATIVE pg/mL
MDMA Screen, Urine: NEGATIVE ng/mL
MEPERIDINE SCREEN, URINE: NEGATIVE ng/mL
Methadone Screen, Urine: NEGATIVE ng/mL
Nitrite Urine, Quantitative: NEGATIVE ug/mL
PHENCYCLIDINE QUANTITATIVE URINE: NEGATIVE ng/mL
Ph of Urine: 5.8 (ref 4.5–8.9)
Propoxyphene Scrn, Ur: NEGATIVE ng/mL
SPECIFIC GRAVITY: 1.015
TRAMADOL SCREEN, URINE: NEGATIVE ng/mL
Tapentadol, Urine: NEGATIVE ng/mL

## 2015-06-18 LAB — OXYCODONE/OXYMORPHONE CONFIRM
OXYCODONE/OXYMORPH: POSITIVE — AB
OXYCODONE: POSITIVE — AB
OXYMORPHONE CONFIRM: 1670 ng/mL
OXYMORPHONE: POSITIVE — AB

## 2015-06-18 LAB — OPIATES CONFIRMATION, URINE: Opiates: NEGATIVE ng/mL

## 2015-06-22 ENCOUNTER — Other Ambulatory Visit: Payer: Self-pay | Admitting: Internal Medicine

## 2015-06-22 DIAGNOSIS — I4891 Unspecified atrial fibrillation: Secondary | ICD-10-CM

## 2015-06-23 ENCOUNTER — Other Ambulatory Visit: Payer: Self-pay | Admitting: Internal Medicine

## 2015-06-23 DIAGNOSIS — I10 Essential (primary) hypertension: Secondary | ICD-10-CM

## 2015-06-29 DIAGNOSIS — G8929 Other chronic pain: Secondary | ICD-10-CM | POA: Diagnosis not present

## 2015-06-29 DIAGNOSIS — M1712 Unilateral primary osteoarthritis, left knee: Secondary | ICD-10-CM | POA: Diagnosis not present

## 2015-06-29 DIAGNOSIS — M25562 Pain in left knee: Secondary | ICD-10-CM | POA: Diagnosis not present

## 2015-06-29 DIAGNOSIS — Z87828 Personal history of other (healed) physical injury and trauma: Secondary | ICD-10-CM | POA: Diagnosis not present

## 2015-06-29 DIAGNOSIS — M898X6 Other specified disorders of bone, lower leg: Secondary | ICD-10-CM | POA: Diagnosis not present

## 2015-06-29 DIAGNOSIS — M1612 Unilateral primary osteoarthritis, left hip: Secondary | ICD-10-CM | POA: Diagnosis not present

## 2015-06-29 DIAGNOSIS — M179 Osteoarthritis of knee, unspecified: Secondary | ICD-10-CM | POA: Diagnosis not present

## 2015-07-07 DIAGNOSIS — M1712 Unilateral primary osteoarthritis, left knee: Secondary | ICD-10-CM | POA: Diagnosis not present

## 2015-07-12 ENCOUNTER — Ambulatory Visit (INDEPENDENT_AMBULATORY_CARE_PROVIDER_SITE_OTHER): Payer: Medicare Other | Admitting: *Deleted

## 2015-07-12 DIAGNOSIS — Z5181 Encounter for therapeutic drug level monitoring: Secondary | ICD-10-CM

## 2015-07-12 DIAGNOSIS — Z7901 Long term (current) use of anticoagulants: Secondary | ICD-10-CM | POA: Diagnosis not present

## 2015-07-12 DIAGNOSIS — I4891 Unspecified atrial fibrillation: Secondary | ICD-10-CM | POA: Diagnosis not present

## 2015-07-12 LAB — POCT INR: INR: 1.5

## 2015-07-26 ENCOUNTER — Ambulatory Visit (INDEPENDENT_AMBULATORY_CARE_PROVIDER_SITE_OTHER): Payer: Medicare Other | Admitting: *Deleted

## 2015-07-26 DIAGNOSIS — I4891 Unspecified atrial fibrillation: Secondary | ICD-10-CM

## 2015-07-26 DIAGNOSIS — Z5181 Encounter for therapeutic drug level monitoring: Secondary | ICD-10-CM

## 2015-07-26 DIAGNOSIS — Z7901 Long term (current) use of anticoagulants: Secondary | ICD-10-CM | POA: Diagnosis not present

## 2015-07-26 LAB — POCT INR: INR: 1.5

## 2015-07-26 NOTE — Progress Notes (Signed)
UDS: Positive for oxycodone (expected)  BMP: K 3.5, Cr 0.91, eGFR 106  TSH 3.36  Continue current management.

## 2015-08-08 DIAGNOSIS — E291 Testicular hypofunction: Secondary | ICD-10-CM | POA: Diagnosis not present

## 2015-08-08 DIAGNOSIS — N5201 Erectile dysfunction due to arterial insufficiency: Secondary | ICD-10-CM | POA: Diagnosis not present

## 2015-08-09 ENCOUNTER — Ambulatory Visit (INDEPENDENT_AMBULATORY_CARE_PROVIDER_SITE_OTHER): Payer: Medicare Other | Admitting: *Deleted

## 2015-08-09 DIAGNOSIS — Z7901 Long term (current) use of anticoagulants: Secondary | ICD-10-CM | POA: Diagnosis not present

## 2015-08-09 DIAGNOSIS — Z5181 Encounter for therapeutic drug level monitoring: Secondary | ICD-10-CM

## 2015-08-09 DIAGNOSIS — I4891 Unspecified atrial fibrillation: Secondary | ICD-10-CM

## 2015-08-09 LAB — POCT INR: INR: 1.9

## 2015-08-18 DIAGNOSIS — M1712 Unilateral primary osteoarthritis, left knee: Secondary | ICD-10-CM | POA: Diagnosis not present

## 2015-08-23 ENCOUNTER — Ambulatory Visit (INDEPENDENT_AMBULATORY_CARE_PROVIDER_SITE_OTHER): Payer: Medicare Other | Admitting: *Deleted

## 2015-08-23 DIAGNOSIS — Z7901 Long term (current) use of anticoagulants: Secondary | ICD-10-CM | POA: Diagnosis not present

## 2015-08-23 DIAGNOSIS — Z5181 Encounter for therapeutic drug level monitoring: Secondary | ICD-10-CM | POA: Diagnosis not present

## 2015-08-23 DIAGNOSIS — I4891 Unspecified atrial fibrillation: Secondary | ICD-10-CM | POA: Diagnosis not present

## 2015-08-23 LAB — POCT INR: INR: 2

## 2015-09-09 ENCOUNTER — Encounter: Payer: Self-pay | Admitting: Internal Medicine

## 2015-09-09 ENCOUNTER — Ambulatory Visit (INDEPENDENT_AMBULATORY_CARE_PROVIDER_SITE_OTHER): Payer: Medicare Other | Admitting: Internal Medicine

## 2015-09-09 VITALS — BP 172/83 | HR 55 | Temp 98.1°F | Wt 308.1 lb

## 2015-09-09 DIAGNOSIS — N521 Erectile dysfunction due to diseases classified elsewhere: Secondary | ICD-10-CM | POA: Diagnosis not present

## 2015-09-09 DIAGNOSIS — X58XXXS Exposure to other specified factors, sequela: Secondary | ICD-10-CM

## 2015-09-09 DIAGNOSIS — I4891 Unspecified atrial fibrillation: Secondary | ICD-10-CM | POA: Diagnosis not present

## 2015-09-09 DIAGNOSIS — T1490 Injury, unspecified: Secondary | ICD-10-CM

## 2015-09-09 DIAGNOSIS — I2510 Atherosclerotic heart disease of native coronary artery without angina pectoris: Secondary | ICD-10-CM

## 2015-09-09 DIAGNOSIS — K219 Gastro-esophageal reflux disease without esophagitis: Secondary | ICD-10-CM

## 2015-09-09 DIAGNOSIS — E1140 Type 2 diabetes mellitus with diabetic neuropathy, unspecified: Secondary | ICD-10-CM

## 2015-09-09 DIAGNOSIS — Z6841 Body Mass Index (BMI) 40.0 and over, adult: Secondary | ICD-10-CM | POA: Diagnosis not present

## 2015-09-09 DIAGNOSIS — Z8042 Family history of malignant neoplasm of prostate: Secondary | ICD-10-CM

## 2015-09-09 DIAGNOSIS — T402X5D Adverse effect of other opioids, subsequent encounter: Secondary | ICD-10-CM | POA: Diagnosis not present

## 2015-09-09 DIAGNOSIS — M1732 Unilateral post-traumatic osteoarthritis, left knee: Secondary | ICD-10-CM

## 2015-09-09 DIAGNOSIS — K5903 Drug induced constipation: Secondary | ICD-10-CM | POA: Diagnosis not present

## 2015-09-09 DIAGNOSIS — Z0000 Encounter for general adult medical examination without abnormal findings: Secondary | ICD-10-CM

## 2015-09-09 DIAGNOSIS — G894 Chronic pain syndrome: Secondary | ICD-10-CM | POA: Diagnosis not present

## 2015-09-09 DIAGNOSIS — E1149 Type 2 diabetes mellitus with other diabetic neurological complication: Secondary | ICD-10-CM | POA: Diagnosis not present

## 2015-09-09 DIAGNOSIS — Z125 Encounter for screening for malignant neoplasm of prostate: Secondary | ICD-10-CM | POA: Diagnosis not present

## 2015-09-09 DIAGNOSIS — L97929 Non-pressure chronic ulcer of unspecified part of left lower leg with unspecified severity: Secondary | ICD-10-CM

## 2015-09-09 DIAGNOSIS — I110 Hypertensive heart disease with heart failure: Secondary | ICD-10-CM

## 2015-09-09 DIAGNOSIS — L97829 Non-pressure chronic ulcer of other part of left lower leg with unspecified severity: Secondary | ICD-10-CM | POA: Diagnosis not present

## 2015-09-09 DIAGNOSIS — I5032 Chronic diastolic (congestive) heart failure: Secondary | ICD-10-CM | POA: Diagnosis not present

## 2015-09-09 DIAGNOSIS — G4733 Obstructive sleep apnea (adult) (pediatric): Secondary | ICD-10-CM | POA: Diagnosis not present

## 2015-09-09 DIAGNOSIS — Z79899 Other long term (current) drug therapy: Secondary | ICD-10-CM | POA: Diagnosis not present

## 2015-09-09 DIAGNOSIS — I10 Essential (primary) hypertension: Secondary | ICD-10-CM

## 2015-09-09 DIAGNOSIS — Z8546 Personal history of malignant neoplasm of prostate: Secondary | ICD-10-CM | POA: Diagnosis not present

## 2015-09-09 DIAGNOSIS — T402X5A Adverse effect of other opioids, initial encounter: Secondary | ICD-10-CM

## 2015-09-09 DIAGNOSIS — I83029 Varicose veins of left lower extremity with ulcer of unspecified site: Secondary | ICD-10-CM

## 2015-09-09 DIAGNOSIS — I83028 Varicose veins of left lower extremity with ulcer other part of lower leg: Secondary | ICD-10-CM

## 2015-09-09 DIAGNOSIS — I25119 Atherosclerotic heart disease of native coronary artery with unspecified angina pectoris: Secondary | ICD-10-CM

## 2015-09-09 LAB — POCT GLYCOSYLATED HEMOGLOBIN (HGB A1C): Hemoglobin A1C: 8.6

## 2015-09-09 LAB — GLUCOSE, CAPILLARY: Glucose-Capillary: 220 mg/dL — ABNORMAL HIGH (ref 65–99)

## 2015-09-09 MED ORDER — OXYCODONE-ACETAMINOPHEN 10-325 MG PO TABS: 1.0000 | ORAL_TABLET | Freq: Four times a day (QID) | ORAL | Status: DC | PRN

## 2015-09-09 MED ORDER — VALSARTAN 160 MG PO TABS: 160.0000 mg | ORAL_TABLET | Freq: Every day | ORAL | Status: DC

## 2015-09-09 NOTE — Assessment & Plan Note (Signed)
Assessment  Diabetic control on diet alone has deteriorated since the last visit.  He admits to eating more sweets during the holidays and not paying attention to portion sizes.  He has put on 17 pounds since the last visit, some of this being fat, not just water.  He saw urology for his ED and was started on adrogel which resulted in a rash.  He was not felt to be a candidate for surgical placement of a penile prosthesis given his multiple co-morbidities.  Plan  He wishes to continue to give diet a try and will work on his diet to include portion control.  I am OK with this plan, put if he does not make any progress in diabetes control by the next visit I will recommend starting low dose metformin.  We will reassess his diabetic control at the next visit with a repeat Hgb A1C.  For his ED he will contact Urology for an alternative treatment.  He mentioned they discussed a shot but I am unsure if this testosterone or alprostadil.  Will reassess what was started at the follow-up visit.

## 2015-09-09 NOTE — Assessment & Plan Note (Signed)
Assessment  He has a regular rhythm on auscultation today suggesting he remains in NSR.  Plan  We will continue the flecainide to maintain NSR and anticoagulation with coumadin for which he is followed in the [place] anticoagulation clinic.As he has not tolerated CPAP for his OSA he will be referred to an orthodontist to be fitted for an oral appliance in hopes of managing the OSA so it does not result in flipping back into atrial fibrillation.

## 2015-09-09 NOTE — Patient Instructions (Addendum)
It was nice to see you again.  1)  I made a referral to Dr. Ron Parker an orthodontist to fit you with an oral appliance to see if we can better manage your sleep apnea.  2) We checked some blood and urine work (including a PSA) today.  I will call you if there is anything concerning when I get the results next week.  3) Work on [employer] now that the holidays are over to see if we can get control of your diabetes once again.  4) Increase your torsemide to 3 times a week for 1 month to see if we can get some more water weight off.  5) We will perform a foot examination at the next visit as part of your diabetic screening.  6) I placed a referral for Orthopaedic surgery for injections into your left knee.  7) Restart the valsartan at 160 mg daily.  8) I filled out your yearly disability work.  9) I placed a request that you be fitted for a left leg compression stocking refit to a pressure of 20-22 mmHg  10) We will slowly taper your oxycodone by 1 tablet per month until we get to #105 per month (MED < 50/day) or the number is insufficient to maintain your function.  I will see you in 3 months, sooner if necessary.

## 2015-09-09 NOTE — Assessment & Plan Note (Signed)
Assessment  He saw orthopedic surgery who was ery hesitant to consider surgery given bony abnormalities associated with his previous traumatic injury.  They would consider injection therapy and I suspect this would be with either synvisc or steroids.  Plan  An ambulatory referral to orthopedics was completed to consider injection therapy for his left knee in hopes of providing his with some symptomatic relief, allowing titration of the percocet dose down to the safer MED of < 50 mg/day.  We will reassess his pain control at the return visit.

## 2015-09-09 NOTE — Assessment & Plan Note (Signed)
Assessment  He has not had any angina, nor required any NTG recently on his current antianginal regimen.  Plan  We will continue the metoprolol at 25 mg twice daily as he has been w/o angina and is appropriately beta blocked.  We will also continue the statin therapy and have restarted the ARB as noted above for afterload reduction.  We will reassess the control of his stable angina at the follow-up visit.

## 2015-09-09 NOTE — Progress Notes (Signed)
   Subjective:    Patient ID: Juan Calderon, male    DOB: 1956-07-18, 60 y.o.   MRN: [id number]  HPI  Juan Calderon is here for follow-up of his diabetes, hypertension, diastolic heart failure, erectile dysfunction, obstructive sleep apnea, and chronic pain. Please see the A&P for the status of the pt's chronic medical problems.  Review of Systems  Constitutional: Positive for unexpected weight change. Negative for activity change and appetite change.       17 pound weight gain since last visit partially due to dietary indiscretions over the holidays per Juan Calderon.  Respiratory: Positive for shortness of breath. Negative for cough, chest tightness and wheezing.   Cardiovascular: Positive for leg swelling. Negative for chest pain and palpitations.       Chronic left lower extremity edema.  Gastrointestinal: Negative for nausea, vomiting, abdominal pain, diarrhea and constipation.  Musculoskeletal: Positive for myalgias, back pain, arthralgias, gait problem and neck pain. Negative for joint swelling.  Neurological: Positive for headaches. Negative for syncope and weakness.       Headaches and myalgias since he developed a recent cold.      Objective:   Physical Exam  Constitutional: He is oriented to person, place, and time. He appears well-developed and well-nourished. No distress.  HENT:  Head: Normocephalic and atraumatic.  Eyes: Conjunctivae are normal. Right eye exhibits no discharge. Left eye exhibits no discharge. No scleral icterus.  Cardiovascular: Normal rate, regular rhythm and normal heart sounds.  Exam reveals no gallop and no friction rub.   No murmur heard. Pulmonary/Chest: Effort normal and breath sounds normal. No respiratory distress. He has no wheezes. He has no rales. He exhibits no tenderness.  Abdominal: Soft. Bowel sounds are normal. He exhibits no distension. There is no tenderness. There is no rebound and no guarding.  Musculoskeletal: He exhibits edema. He  exhibits no tenderness.  Antalgic gait favoring left knee.  Chronic LE bilaterally L > R.  Neurological: He is alert and oriented to person, place, and time. He exhibits normal muscle tone.  Skin: Skin is warm and dry. No rash noted. He is not diaphoretic. No erythema.  Psychiatric: He has a normal mood and affect. His behavior is normal. Judgment and thought content normal.  Nursing note and vitals reviewed.     Assessment & Plan:   Please see problem based charting.

## 2015-09-09 NOTE — Assessment & Plan Note (Signed)
Assessment  Stable on his current regimen of percocet 10-325 mg 1 tablet PO Q6H PRN to maintain function.  He states he does not necessarily require it every 6 hours on every day.  Last UDS was appropriate and check of [place] database was unrevealing as his [facility] does not record despensing the percocet.  His current MED is 60 mg/day.  Given his concomitant EtOH abuse we discussed the dangers of a MED of > 50 mg/day and the need to wean down to a MED < 50 mg/day if possible.  This would be #105 tablets per month.  Plan  We will continue the percocet 10-325 mg 1 tablet every 6 hours as needed for pain but lower the number of pills he receives each month by 1 per month.  We will continue this until it impacts upon his function or we get to #105 pills/month.  We will reassess control of his chronic pain at the follow-up visit.

## 2015-09-09 NOTE — Assessment & Plan Note (Signed)
A urine for microalbumin was ordered and is pending at the time of this note.  Because of a positive family history of prostate cancer he desires screening with a PSA.  His last PSA was low in 2014 and a repeat was drawn today and is pending at the time of this note.  We will discuss a zostavax at the follow-up visit.  He deferred today's diabetic foot examination until the follow-up visit.

## 2015-09-09 NOTE — Assessment & Plan Note (Signed)
Assessment  He may have mild decompensation of his chronic diastolic heart failure as his DOE has worsened and his weight has increased.  His elevated afterload has also likely contributed to this.  Plan  We will increase the torsemide 40 mg to three times a week dosing for 1 month and reassess his DOE and weight.  We will continue the metoprolol at 25 mg BID as he is beta blocked by heart rate.  We will also add back the valsartan 160 mg daily in hopes of getting better control of his afterload and thus improve his diastolic dysfunction.  We will reassess the state of his chronic heart failure at the follow-up visit by reassessing the weight and degree of DOE.

## 2015-09-09 NOTE — Assessment & Plan Note (Signed)
Assessment  This has gone untreated as he has been unable to tolerate the CPAP mask.  Plan  We have placed a referral to an orthodontist Dr. Oneal Grout who manufactures oral appliances to help control OSA.  We will reassess his tolerance of this therapy at the follow-up visit.

## 2015-09-09 NOTE — Assessment & Plan Note (Signed)
Assessment  Blood pressure remains elevated with the recent weight gain and the fact that all of his previous antihypertensives that were stopped in the hospital have not been restarted.  Plan  We will restart the valsartan at 160 mg daily and consider increasing to 320 mg daily as tolerated if necessary.  He will continue metoprolol 25 mg BID and terazosin 5 mg QHS.  We will reassess his blood pressure control at the follow-up visit.

## 2015-09-09 NOTE — Assessment & Plan Note (Signed)
Assessment  He is asymptomatic on the esomeprazole 40 mg daily.  He mentions that he may have had Barrett's esophagus in the past with the last endoscopy being around 2010.  PLAN  We will continue the esomeprazole at 40 mg daily and try to get records from his prior gastroenterologist [facility] in [place].  If he has had Barrett's esophagus we will refer him to GI for further evaluation.

## 2015-09-09 NOTE — Assessment & Plan Note (Signed)
Assessment  He has put on 17 pounds since the last visit and attributes this to dietary indiscretions over the holidays.  Plan  We discussed the importance of weight control, especially given the deterioration of his diabetes with the weight gain.  He states he knows what he needs to do including avoiding sweets and watching portion control.  He states he will work on this now that the holidays are over.

## 2015-09-09 NOTE — Assessment & Plan Note (Signed)
Assessment  His compression stockings have been ineffective in controlling his postraumatic edema of the LLE.  It is unclear if these have been fitted, or simply off of the shelf.  Plan  A prescription for LE compression stockings at 20-22 mmHg was handed to the patient to take to a local establishment for fitting.  We will reassess control of his LLE edema using the fitted compression stocking at the follow-up visit.

## 2015-09-09 NOTE — Assessment & Plan Note (Signed)
Assessment  His opoid induced constipation has been well controlled on sorbitol 70% 2 tablespoons each morning.  Plan  We will continue the sorbitol 70% at 2 tablespoons per day and reassess the control of his constipation at the follow-up visit.

## 2015-09-10 LAB — BMP8+ANION GAP
Anion Gap: 21 mmol/L — ABNORMAL HIGH (ref 10.0–18.0)
BUN / CREAT RATIO: 13 (ref 10–22)
BUN: 10 mg/dL (ref 8–27)
CALCIUM: 8.9 mg/dL (ref 8.6–10.2)
CHLORIDE: 96 mmol/L (ref 96–106)
CO2: 23 mmol/L (ref 18–29)
Creatinine, Ser: 0.78 mg/dL (ref 0.76–1.27)
GFR calc non Af Amer: 97 mL/min/{1.73_m2} (ref >59)
GFR, EST AFRICAN AMERICAN: 113 mL/min/{1.73_m2} (ref >59)
GLUCOSE: 230 mg/dL — AB (ref 65–99)
POTASSIUM: 3.9 mmol/L (ref 3.5–5.2)
SODIUM: 140 mmol/L (ref 134–144)

## 2015-09-10 LAB — MICROALBUMIN / CREATININE URINE RATIO
Creatinine, Urine: 143.7 mg/dL
MICROALB/CREAT RATIO: 44.3 mg/g{creat} — AB (ref 0.0–30.0)
Microalbumin, Urine: 63.6 ug/mL

## 2015-09-10 LAB — PSA: Prostate Specific Ag, Serum: 0.4 ng/mL (ref 0.0–4.0)

## 2015-09-10 NOTE — Progress Notes (Signed)
BMP: Cr 0.78, K 3.9, glucose 230, eGFR 113  PSA: 0.4  No changes in regimen required at this time.

## 2015-09-12 NOTE — Progress Notes (Signed)
Microalbumin/creatinine ratio: 44.3  Improved but still elevated.  Was restarted on ARB which should be helpful in this regard.

## 2015-09-13 ENCOUNTER — Ambulatory Visit (INDEPENDENT_AMBULATORY_CARE_PROVIDER_SITE_OTHER): Payer: Medicare Other | Admitting: *Deleted

## 2015-09-13 DIAGNOSIS — I4891 Unspecified atrial fibrillation: Secondary | ICD-10-CM | POA: Diagnosis not present

## 2015-09-13 DIAGNOSIS — Z7901 Long term (current) use of anticoagulants: Secondary | ICD-10-CM

## 2015-09-13 DIAGNOSIS — Z5181 Encounter for therapeutic drug level monitoring: Secondary | ICD-10-CM | POA: Diagnosis not present

## 2015-09-13 LAB — POCT INR: INR: 2.1

## 2015-09-15 NOTE — Telephone Encounter (Signed)
Addressed at 09/09/2015 visit with pcp, note complete

## 2015-09-18 ENCOUNTER — Other Ambulatory Visit: Payer: Self-pay | Admitting: Internal Medicine

## 2015-09-18 DIAGNOSIS — I4891 Unspecified atrial fibrillation: Secondary | ICD-10-CM

## 2015-10-11 ENCOUNTER — Ambulatory Visit (INDEPENDENT_AMBULATORY_CARE_PROVIDER_SITE_OTHER): Payer: Medicare Other | Admitting: *Deleted

## 2015-10-11 DIAGNOSIS — I4891 Unspecified atrial fibrillation: Secondary | ICD-10-CM | POA: Diagnosis not present

## 2015-10-11 DIAGNOSIS — Z7901 Long term (current) use of anticoagulants: Secondary | ICD-10-CM | POA: Diagnosis not present

## 2015-10-11 DIAGNOSIS — Z5181 Encounter for therapeutic drug level monitoring: Secondary | ICD-10-CM | POA: Diagnosis not present

## 2015-10-11 LAB — POCT INR: INR: 1.8

## 2015-10-25 ENCOUNTER — Emergency Department (HOSPITAL_COMMUNITY): Payer: Medicare Other

## 2015-10-25 ENCOUNTER — Encounter (HOSPITAL_COMMUNITY): Payer: Self-pay | Admitting: *Deleted

## 2015-10-25 ENCOUNTER — Inpatient Hospital Stay (HOSPITAL_COMMUNITY)
Admission: EM | Admit: 2015-10-25 | Discharge: 2015-10-27 | DRG: 308 | Disposition: A | Discharge status: Home / Self Care | Payer: Medicare Other | Attending: Student in an Organized Health Care Education/Training Program | Admitting: Student in an Organized Health Care Education/Training Program

## 2015-10-25 DIAGNOSIS — Z7901 Long term (current) use of anticoagulants: Secondary | ICD-10-CM | POA: Diagnosis not present

## 2015-10-25 DIAGNOSIS — G4733 Obstructive sleep apnea (adult) (pediatric): Secondary | ICD-10-CM | POA: Diagnosis present

## 2015-10-25 DIAGNOSIS — I5032 Chronic diastolic (congestive) heart failure: Secondary | ICD-10-CM | POA: Diagnosis present

## 2015-10-25 DIAGNOSIS — H4010X0 Unspecified open-angle glaucoma, stage unspecified: Secondary | ICD-10-CM | POA: Diagnosis present

## 2015-10-25 DIAGNOSIS — E785 Hyperlipidemia, unspecified: Secondary | ICD-10-CM | POA: Diagnosis present

## 2015-10-25 DIAGNOSIS — J449 Chronic obstructive pulmonary disease, unspecified: Secondary | ICD-10-CM | POA: Diagnosis present

## 2015-10-25 DIAGNOSIS — I5030 Unspecified diastolic (congestive) heart failure: Secondary | ICD-10-CM | POA: Diagnosis present

## 2015-10-25 DIAGNOSIS — I4892 Unspecified atrial flutter: Secondary | ICD-10-CM | POA: Diagnosis not present

## 2015-10-25 DIAGNOSIS — G894 Chronic pain syndrome: Secondary | ICD-10-CM | POA: Diagnosis present

## 2015-10-25 DIAGNOSIS — E1122 Type 2 diabetes mellitus with diabetic chronic kidney disease: Secondary | ICD-10-CM | POA: Diagnosis present

## 2015-10-25 DIAGNOSIS — I5033 Acute on chronic diastolic (congestive) heart failure: Secondary | ICD-10-CM | POA: Diagnosis not present

## 2015-10-25 DIAGNOSIS — I481 Persistent atrial fibrillation: Principal | ICD-10-CM | POA: Diagnosis present

## 2015-10-25 DIAGNOSIS — E662 Morbid (severe) obesity with alveolar hypoventilation: Secondary | ICD-10-CM | POA: Diagnosis present

## 2015-10-25 DIAGNOSIS — I110 Hypertensive heart disease with heart failure: Secondary | ICD-10-CM | POA: Diagnosis not present

## 2015-10-25 DIAGNOSIS — I480 Paroxysmal atrial fibrillation: Secondary | ICD-10-CM | POA: Diagnosis present

## 2015-10-25 DIAGNOSIS — I10 Essential (primary) hypertension: Secondary | ICD-10-CM | POA: Diagnosis present

## 2015-10-25 DIAGNOSIS — I25119 Atherosclerotic heart disease of native coronary artery with unspecified angina pectoris: Secondary | ICD-10-CM | POA: Diagnosis present

## 2015-10-25 DIAGNOSIS — F1010 Alcohol abuse, uncomplicated: Secondary | ICD-10-CM | POA: Diagnosis present

## 2015-10-25 DIAGNOSIS — I517 Cardiomegaly: Secondary | ICD-10-CM | POA: Diagnosis not present

## 2015-10-25 DIAGNOSIS — E876 Hypokalemia: Secondary | ICD-10-CM | POA: Diagnosis present

## 2015-10-25 DIAGNOSIS — M1712 Unilateral primary osteoarthritis, left knee: Secondary | ICD-10-CM | POA: Diagnosis present

## 2015-10-25 DIAGNOSIS — I2510 Atherosclerotic heart disease of native coronary artery without angina pectoris: Secondary | ICD-10-CM | POA: Diagnosis present

## 2015-10-25 DIAGNOSIS — R739 Hyperglycemia, unspecified: Secondary | ICD-10-CM

## 2015-10-25 DIAGNOSIS — M109 Gout, unspecified: Secondary | ICD-10-CM

## 2015-10-25 DIAGNOSIS — Z6841 Body Mass Index (BMI) 40.0 and over, adult: Secondary | ICD-10-CM

## 2015-10-25 DIAGNOSIS — K219 Gastro-esophageal reflux disease without esophagitis: Secondary | ICD-10-CM | POA: Diagnosis present

## 2015-10-25 DIAGNOSIS — E1140 Type 2 diabetes mellitus with diabetic neuropathy, unspecified: Secondary | ICD-10-CM | POA: Diagnosis present

## 2015-10-25 DIAGNOSIS — E119 Type 2 diabetes mellitus without complications: Secondary | ICD-10-CM

## 2015-10-25 DIAGNOSIS — E1165 Type 2 diabetes mellitus with hyperglycemia: Secondary | ICD-10-CM | POA: Diagnosis present

## 2015-10-25 DIAGNOSIS — I4891 Unspecified atrial fibrillation: Secondary | ICD-10-CM | POA: Diagnosis present

## 2015-10-25 DIAGNOSIS — I483 Typical atrial flutter: Secondary | ICD-10-CM | POA: Diagnosis not present

## 2015-10-25 LAB — MAGNESIUM: Magnesium: 1 mg/dL — ABNORMAL LOW (ref 1.7–2.4)

## 2015-10-25 LAB — GLUCOSE, CAPILLARY
GLUCOSE-CAPILLARY: 312 mg/dL — AB (ref 65–99)
Glucose-Capillary: 259 mg/dL — ABNORMAL HIGH (ref 65–99)

## 2015-10-25 LAB — PROTIME-INR
INR: 2.08 — ABNORMAL HIGH (ref 0.00–1.49)
Prothrombin Time: 23.2 seconds — ABNORMAL HIGH (ref 11.6–15.2)

## 2015-10-25 LAB — BRAIN NATRIURETIC PEPTIDE: B Natriuretic Peptide: 51.9 pg/mL (ref 0.0–100.0)

## 2015-10-25 LAB — CBC
HEMATOCRIT: 42.8 % (ref 39.0–52.0)
Hemoglobin: 13.9 g/dL (ref 13.0–17.0)
MCH: 29 pg (ref 26.0–34.0)
MCHC: 32.5 g/dL (ref 30.0–36.0)
MCV: 89.4 fL (ref 78.0–100.0)
Platelets: 165 10*3/uL (ref 150–400)
RBC: 4.79 MIL/uL (ref 4.22–5.81)
RDW: 13.6 % (ref 11.5–15.5)
WBC: 7.1 10*3/uL (ref 4.0–10.5)

## 2015-10-25 LAB — BASIC METABOLIC PANEL
Anion gap: 14 (ref 5–15)
BUN: 17 mg/dL (ref 6–20)
CALCIUM: 9 mg/dL (ref 8.9–10.3)
CO2: 29 mmol/L (ref 22–32)
CREATININE: 1.21 mg/dL (ref 0.61–1.24)
Chloride: 95 mmol/L — ABNORMAL LOW (ref 101–111)
GFR calc non Af Amer: >60 mL/min (ref >60)
Glucose, Bld: 420 mg/dL — ABNORMAL HIGH (ref 65–99)
Potassium: 3.1 mmol/L — ABNORMAL LOW (ref 3.5–5.1)
Sodium: 138 mmol/L (ref 135–145)

## 2015-10-25 LAB — I-STAT TROPONIN, ED: Troponin i, poc: 0.02 ng/mL (ref 0.00–0.08)

## 2015-10-25 LAB — TSH: TSH: 2.492 u[IU]/mL (ref 0.350–4.500)

## 2015-10-25 LAB — CBG MONITORING, ED: Glucose-Capillary: 364 mg/dL — ABNORMAL HIGH (ref 65–99)

## 2015-10-25 LAB — APTT: aPTT: 35 seconds (ref 24–37)

## 2015-10-25 MED ORDER — SORBITOL 70 % PO SOLN: 15.0000 mL | Freq: Every day | ORAL | Status: DC | PRN

## 2015-10-25 MED ORDER — WARFARIN - PHARMACIST DOSING INPATIENT
Freq: Every day | Status: DC
  Administered 2015-10-25 – 2015-10-26 (×2)

## 2015-10-25 MED ORDER — SODIUM CHLORIDE 0.9% FLUSH: 3.0000 mL | INTRAVENOUS | Status: DC | PRN

## 2015-10-25 MED ORDER — INSULIN ASPART 100 UNIT/ML ~~LOC~~ SOLN
0.0000 [IU] | Freq: Three times a day (TID) | SUBCUTANEOUS | Status: DC
Start: 2015-10-25 — End: 2015-10-26
  Administered 2015-10-25: 11 [IU] via SUBCUTANEOUS
  Administered 2015-10-26: 8 [IU] via SUBCUTANEOUS
  Administered 2015-10-26: 11 [IU] via SUBCUTANEOUS

## 2015-10-25 MED ORDER — OXYCODONE-ACETAMINOPHEN 5-325 MG PO TABS
1.0000 | ORAL_TABLET | Freq: Four times a day (QID) | ORAL | Status: DC | PRN
  Administered 2015-10-26: 1 via ORAL
  Filled 2015-10-25: qty 1

## 2015-10-25 MED ORDER — INSULIN ASPART 100 UNIT/ML ~~LOC~~ SOLN: 0.0000 [IU] | Freq: Three times a day (TID) | SUBCUTANEOUS | Status: DC

## 2015-10-25 MED ORDER — ZOLPIDEM TARTRATE 5 MG PO TABS: 5.0000 mg | ORAL_TABLET | Freq: Every evening | ORAL | Status: DC | PRN

## 2015-10-25 MED ORDER — WARFARIN SODIUM 5 MG PO TABS
5.0000 mg | ORAL_TABLET | Freq: Once | ORAL | Status: AC
  Administered 2015-10-25: 5 mg via ORAL
  Filled 2015-10-25: qty 1

## 2015-10-25 MED ORDER — FUROSEMIDE 10 MG/ML IJ SOLN
80.0000 mg | Freq: Once | INTRAMUSCULAR | Status: AC
  Administered 2015-10-25: 80 mg via INTRAVENOUS
  Filled 2015-10-25: qty 8

## 2015-10-25 MED ORDER — INSULIN ASPART 100 UNIT/ML ~~LOC~~ SOLN
10.0000 [IU] | Freq: Once | SUBCUTANEOUS | Status: AC
  Administered 2015-10-25: 10 [IU] via INTRAVENOUS
  Filled 2015-10-25: qty 1

## 2015-10-25 MED ORDER — LATANOPROST 0.005 % OP SOLN
1.0000 [drp] | Freq: Every day | OPHTHALMIC | Status: DC
  Administered 2015-10-25 – 2015-10-27 (×2): 1 [drp] via OPHTHALMIC
  Filled 2015-10-25 (×3): qty 2.5

## 2015-10-25 MED ORDER — IRBESARTAN 75 MG PO TABS
75.0000 mg | ORAL_TABLET | Freq: Every day | ORAL | Status: DC
  Administered 2015-10-26: 75 mg via ORAL
  Filled 2015-10-25 (×2): qty 1

## 2015-10-25 MED ORDER — NITROGLYCERIN 0.4 MG SL SUBL: 0.4000 mg | SUBLINGUAL_TABLET | SUBLINGUAL | Status: DC | PRN

## 2015-10-25 MED ORDER — TERAZOSIN HCL 5 MG PO CAPS
5.0000 mg | ORAL_CAPSULE | Freq: Every day | ORAL | Status: DC
  Administered 2015-10-25 – 2015-10-26 (×2): 5 mg via ORAL
  Filled 2015-10-25 (×3): qty 1

## 2015-10-25 MED ORDER — METOPROLOL TARTRATE 1 MG/ML IV SOLN
5.0000 mg | Freq: Once | INTRAVENOUS | Status: AC
  Administered 2015-10-25: 5 mg via INTRAVENOUS
  Filled 2015-10-25: qty 5

## 2015-10-25 MED ORDER — ACETAMINOPHEN 325 MG PO TABS: 650.0000 mg | ORAL_TABLET | ORAL | Status: DC | PRN

## 2015-10-25 MED ORDER — ALPRAZOLAM 0.25 MG PO TABS
0.2500 mg | ORAL_TABLET | Freq: Two times a day (BID) | ORAL | Status: DC | PRN
  Administered 2015-10-26: 0.25 mg via ORAL
  Filled 2015-10-25: qty 1

## 2015-10-25 MED ORDER — MAGNESIUM SULFATE 2 GM/50ML IV SOLN
2.0000 g | Freq: Once | INTRAVENOUS | Status: AC
  Administered 2015-10-25: 2 g via INTRAVENOUS
  Filled 2015-10-25: qty 50

## 2015-10-25 MED ORDER — INSULIN ASPART 100 UNIT/ML ~~LOC~~ SOLN
0.0000 [IU] | Freq: Every day | SUBCUTANEOUS | Status: DC
  Administered 2015-10-25: 3 [IU] via SUBCUTANEOUS

## 2015-10-25 MED ORDER — METOPROLOL TARTRATE 1 MG/ML IV SOLN
5.0000 mg | Freq: Once | INTRAVENOUS | Status: DC
  Filled 2015-10-25: qty 5

## 2015-10-25 MED ORDER — ATORVASTATIN CALCIUM 10 MG PO TABS
10.0000 mg | ORAL_TABLET | Freq: Every day | ORAL | Status: DC
  Administered 2015-10-26 – 2015-10-27 (×2): 10 mg via ORAL
  Filled 2015-10-25 (×2): qty 1

## 2015-10-25 MED ORDER — ASPIRIN 81 MG PO CHEW
324.0000 mg | CHEWABLE_TABLET | Freq: Once | ORAL | Status: AC
Start: 2015-10-25 — End: 2015-10-25
  Administered 2015-10-25: 324 mg via ORAL
  Filled 2015-10-25: qty 4

## 2015-10-25 MED ORDER — PANTOPRAZOLE SODIUM 40 MG PO TBEC
40.0000 mg | DELAYED_RELEASE_TABLET | Freq: Every day | ORAL | Status: DC
  Administered 2015-10-26 – 2015-10-27 (×2): 40 mg via ORAL
  Filled 2015-10-25 (×4): qty 1

## 2015-10-25 MED ORDER — OXYCODONE HCL 5 MG PO TABS
5.0000 mg | ORAL_TABLET | Freq: Four times a day (QID) | ORAL | Status: DC | PRN
  Administered 2015-10-26: 5 mg via ORAL
  Filled 2015-10-25: qty 1

## 2015-10-25 MED ORDER — METOPROLOL TARTRATE 25 MG PO TABS
25.0000 mg | ORAL_TABLET | Freq: Two times a day (BID) | ORAL | Status: DC
  Administered 2015-10-25 – 2015-10-27 (×4): 25 mg via ORAL
  Filled 2015-10-25 (×5): qty 1

## 2015-10-25 MED ORDER — OXYCODONE-ACETAMINOPHEN 10-325 MG PO TABS: 1.0000 | ORAL_TABLET | Freq: Four times a day (QID) | ORAL | Status: DC | PRN

## 2015-10-25 MED ORDER — SODIUM CHLORIDE 0.9 % IV BOLUS (SEPSIS): 500.0000 mL | Freq: Once | INTRAVENOUS | Status: DC

## 2015-10-25 MED ORDER — SODIUM CHLORIDE 0.9 % IV BOLUS (SEPSIS): 1000.0000 mL | Freq: Once | INTRAVENOUS | Status: DC

## 2015-10-25 MED ORDER — SODIUM CHLORIDE 0.9% FLUSH
3.0000 mL | Freq: Two times a day (BID) | INTRAVENOUS | Status: DC
  Administered 2015-10-25 – 2015-10-27 (×5): 3 mL via INTRAVENOUS

## 2015-10-25 MED ORDER — FLECAINIDE ACETATE 100 MG PO TABS
100.0000 mg | ORAL_TABLET | Freq: Two times a day (BID) | ORAL | Status: DC
Start: 2015-10-25 — End: 2015-10-27
  Administered 2015-10-26 – 2015-10-27 (×3): 100 mg via ORAL
  Filled 2015-10-25 (×6): qty 1

## 2015-10-25 MED ORDER — ONDANSETRON HCL 4 MG/2ML IJ SOLN: 4.0000 mg | Freq: Four times a day (QID) | INTRAMUSCULAR | Status: DC | PRN

## 2015-10-25 MED ORDER — ALLOPURINOL 300 MG PO TABS
300.0000 mg | ORAL_TABLET | Freq: Every day | ORAL | Status: DC
  Administered 2015-10-26 – 2015-10-27 (×2): 300 mg via ORAL
  Filled 2015-10-25 (×2): qty 1

## 2015-10-25 MED ORDER — TORSEMIDE 20 MG PO TABS: 40.0000 mg | ORAL_TABLET | ORAL | Status: DC

## 2015-10-25 MED ORDER — INSULIN ASPART 100 UNIT/ML ~~LOC~~ SOLN: 0.0000 [IU] | Freq: Every day | SUBCUTANEOUS | Status: DC

## 2015-10-25 MED ORDER — SODIUM CHLORIDE 0.9 % IV SOLN: 250.0000 mL | INTRAVENOUS | Status: DC | PRN

## 2015-10-25 MED ORDER — POTASSIUM CHLORIDE 10 MEQ/100ML IV SOLN
10.0000 meq | Freq: Once | INTRAVENOUS | Status: AC
  Administered 2015-10-25: 10 meq via INTRAVENOUS
  Filled 2015-10-25: qty 100

## 2015-10-25 NOTE — ED Provider Notes (Signed)
CSN: [id number]     Arrival date & time 10/25/15  0907 History   First MD Initiated Contact with Patient 10/25/15 1025     Chief Complaint  Patient presents with  . Shortness of Breath     (Consider location/radiation/quality/duration/timing/severity/associated sxs/prior Treatment) Patient is a 60 y.o. male presenting with shortness of breath. The history is provided by the patient.  Shortness of Breath Severity:  Moderate Onset quality:  Gradual Duration:  1 week Timing:  Constant Progression:  Waxing and waning Chronicity:  New Context: activity   Context: not URI   Relieved by:  Nothing Worsened by:  Nothing tried Ineffective treatments:  None tried Associated symptoms: chest pain (started on arrival to ED) and diaphoresis (this morning)   Associated symptoms: no cough, no fever and no vomiting   Risk factors: alcohol use (2 liquor drinks/week)     Past Medical History  Diagnosis Date  . Essential hypertension    . Persistent atrial fibrillation ([place])    . Chronic diastolic heart failure ([place])      with mild left ventricular hypertrophy on Echo 02/2010  . Chronic pain syndrome      Left arm and leg s/p traumatic injury   . Osteoarthritis cervical spine    . Gastroesophageal reflux disease    . Open-angle glaucoma    . 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 (HCC)    . Hyperlipidemia LDL goal < 100    . Coronary artery disease      25% LAD stenosis on cath 2007.  Stable angina.  . Gout    . Right rotator cuff tear      Large full-thickness tear of the supraspinatus with mild retraction but no atrophy   . Obstructive sleep apnea      Moderate, AHI 29.8 per hour with moderately loud snoring and oxygen desaturation to a nadir of 79%. CPAP titration resulted in a prescription for 17 CWP.    [name] Cataract     Left eye  . Morbid obesity with BMI of 40.0-44.9, adult ([place])    . Chronic obstructive pulmonary disease ([place])    . Diverticulosis     . Osteoarthritis of left knee 06/19/2013    Tricompartmental disease.  Treated with double hinged upright knee brace, steroid/xylocaine knee injections, and NSAIDs   . Chronic renal insufficiency    . Alcohol abuse    . Subclinical hypothyroidism    . Normocytic anemia     Past Surgical History  Procedure Laterality Date  . Left leg surgery    . Left arm surgery    . Shoulder surgery      Right  . Fracture surgery Left 1980's    Elbow  . Cardioversion N/A 12/30/2014    Procedure: CARDIOVERSION;  Surgeon: [name], MD;  Location: [facility] ENDOSCOPY;  Service: Cardiovascular;  Laterality: N/A;   Family History  Problem Relation Age of Onset  . Heart failure Mother   . Alzheimer's disease Father   . Healthy Sister   . Healthy Brother   . Healthy Son   . Healthy Sister   . Healthy Sister   . Healthy Sister   . Healthy Sister   . Healthy Brother   . Healthy Brother   . Healthy Brother   . Heart failure Brother   . Healthy Brother   . Osteoarthritis Brother   . Prostate cancer Brother   . Early death Brother     Manufacturing systems engineer  .  Heart attack Neg Hx   . Stroke Neg Hx   . Hypertension Sister   . Hypertension Brother    Social History  Substance Use Topics  . Smoking status: Never Smoker   . Smokeless tobacco: Never Used  . Alcohol Use: 1.0 oz/week    2 drink(s) per week     Comment: Liquor.    Review of Systems  Constitutional: Positive for diaphoresis (this morning). Negative for fever.  Respiratory: Positive for shortness of breath. Negative for cough.   Cardiovascular: Positive for chest pain (started on arrival to ED).  Gastrointestinal: Negative for vomiting.  All other systems reviewed and are negative.     Allergies  Ramipril and Testosterone  Home Medications   Prior to Admission medications   Medication Sig Start Date End Date Taking? Authorizing Provider  allopurinol (ZYLOPRIM) 300 MG tablet Take 1 tablet (300 mg total) by mouth daily. 05/11/15    Oval Linsey, MD  atorvastatin (LIPITOR) 10 MG tablet Take 1 tablet (10 mg total) by mouth daily. 06/10/15   Oval Linsey, MD  esomeprazole (NEXIUM) 40 MG capsule Take 1 capsule (40 mg total) by mouth daily. 05/11/15   Oval Linsey, MD  flecainide (TAMBOCOR) 50 MG tablet Take 1 tablet (50 mg total) by mouth every 12 (twelve) hours. 04/25/15   Oval Linsey, MD  latanoprost (XALATAN) 0.005 % ophthalmic solution Place 1 drop into both eyes at bedtime. 01/27/15   Oval Linsey, MD  metoprolol tartrate (LOPRESSOR) 25 MG tablet Take 1 tablet (25 mg total) by mouth 2 (two) times daily. 04/25/15   Oval Linsey, MD  nitroGLYCERIN (NITROSTAT) 0.4 MG SL tablet Place 1 tablet (0.4 mg total) under the tongue every 5 (five) minutes as needed for chest pain. Patient not taking: Reported on 09/09/2015 06/10/15   Oval Linsey, MD  oxyCODONE-acetaminophen (PERCOCET) 10-325 MG tablet Take 1 tablet by mouth every 6 (six) hours as needed for pain. 09/09/15   Oval Linsey, MD  sorbitol 70 % solution Take 15-60 mLs by mouth daily as needed (mix in coffee, juice, or water). 12/24/14   Oval Linsey, MD  terazosin (HYTRIN) 5 MG capsule Take 1 capsule (5 mg total) by mouth at bedtime. 06/24/15   Oval Linsey, MD  torsemide (DEMADEX) 20 MG tablet Take 2 tablets (40 mg total) by mouth 2 (two) times a week. 04/21/15   Deboraha Sprang, MD  valsartan (DIOVAN) 160 MG tablet Take 1 tablet (160 mg total) by mouth daily. 09/09/15   Oval Linsey, MD  warfarin (COUMADIN) 5 MG tablet Take 5 mg (1 tablet) daily except on Wednesdays and Saturdays take 2.5 mg (0.5 tablet) 09/20/15   Oval Linsey, MD   BP 124/81 mmHg  Pulse 118  Temp(Src) 97.3 F (36.3 C) (Oral)  Resp 16  Wt 306 lb (138.801 kg)  SpO2 96% Physical Exam  Constitutional: He is oriented to person, place, and time. He appears well-developed and well-nourished. No distress.  HENT:  Head: Normocephalic and atraumatic.  Eyes: Conjunctivae are normal.  Neck: Neck  supple. No tracheal deviation present.  Cardiovascular: Regular rhythm, S1 normal, S2 normal and normal heart sounds.   No extrasystoles are present. Tachycardia present.   Pulmonary/Chest: Effort normal and breath sounds normal. No respiratory distress. He has no wheezes. He has no rales.  Abdominal: Soft. He exhibits distension (with anasarca). There is no tenderness.  Musculoskeletal:  2+ b/l LE pitting edema  Neurological: He is alert and oriented to person, place, and time.  Skin:  Skin is warm and dry.  Psychiatric: He has a normal mood and affect.    ED Course  Procedures (including critical care time)  Emergency Focused Ultrasound Exam Limited Ultrasound of the Heart and Pericardium  Performed and interpreted by Dr. Laneta Simmers Indication: shortness of breath Multiple views of the heart, pericardium, and IVC are obtained with a multi frequency probe.  Findings: normal MV excursion, limited windows 2/2 habitus, no anechoic fluid, no IVC collapse Interpretation: nml ejection fraction, no pericardial effusion, elevated CVP Images archived electronically.  CPT Code: 938-126-3252  Emergency Focused Ultrasound Exam Limited Thorax   Performed and interpreted by Dr Laneta Simmers Longitudinal view of anterior left and right lung fields in real-time with linear probe. Indication: shortness of breath Findings: + lung sliding, no anechoic fluid, normal B lines Interpretation: no evidence of pneumothorax. No consolidation,effusion or edema. Images electronically archived.   CPT code: [place] PANEL - Abnormal; Notable for the following:    Potassium 3.1 (*)    Chloride 95 (*)    Glucose, Bld 420 (*)    All other components within normal limits  PROTIME-INR - Abnormal; Notable for the following:    Prothrombin Time 23.2 (*)    INR 2.08 (*)    All other components within normal limits  MAGNESIUM - Abnormal; Notable for the following:    Magnesium 1.0  (*)    All other components within normal limits  CBC  APTT  BRAIN NATRIURETIC PEPTIDE  I-STAT TROPOININ, ED    Imaging Review Dg Chest 2 View  10/25/2015  CLINICAL DATA:  Sob and tingling both hands since last night,hx chf EXAM: CHEST  2 VIEW COMPARISON:  02/28/2015 FINDINGS: Cardiomegaly with vascular congestion. No overt edema. No confluent opacities or effusions. No acute bony abnormality. IMPRESSION: Cardiomegaly, vascular congestion. Electronically Signed   By: Rolm Baptise M.D.   On: 10/25/2015 09:50   I have personally reviewed and evaluated these images and lab results as part of my medical decision-making.   EKG Interpretation   Date/Time:  Tuesday October 25 2015 09:18:34 EST Ventricular Rate:  124 PR Interval:    QRS Duration: 158 QT Interval:  322 QTC Calculation: 462 R Axis:   38 Text Interpretation:  Atrial flutter with variable A-V block with  premature ventricular or aberrantly conducted complexes with rapid  ventricular response Non-specific intra-ventricular conduction block  Abnormal ECG rapid rate is new since last tracing Confirmed by Azaria Stegman MD,  Joal Eakle [id number]) on 10/25/2015 10:47:37 AM      MDM   Final diagnoses:  Acute on chronic diastolic (congestive) heart failure (HCC)  Atrial flutter with rapid ventricular response (HCC)  Hyperglycemia without ketosis  Hypokalemia    60 y.o. male presents with increasing shortness of breath over the last week, has noticed swelling and numbness in his fingertips over the same period of time. Patient has history of atrial fibrillation and flutter and has required cardioversion in the past. He is on metoprolol 25 mg twice a day for hypertension rate control. Clinically patient is slightly volume overloaded and he has no respiratory variation of his IVC on bedside ultrasound with normal mitral valve excursion suggesting diastolic heart failure exacerbation. He arrived in atrial flutter with rapid ventricular response  around 125. Cardiology contacted and will consult on the patient, he will be admitted for medical admission with hyperglycemia requiring insulin adjustment, hypokalemia, multiple comorbid medical conditions. He has had some chest pressure since arriving to  the emergency department but has a negative troponin initially and no acute ischemic findings on EKG. He was given a dose of IV metoprolol which brought his heart rate down from 120s to 90s with improvement of his blood pressure, a second dose was ordered to help stabilize his heart rate. His Coumadin level is therapeutic, he is hematologically stable here, will start diuresis with 80 mg IV Lasix and provide supplementary potassium. Internal medicine was consulted for admission and will see the patient in the emergency department.     Leo Grosser, MD 10/25/15 [phone]

## 2015-10-25 NOTE — Progress Notes (Signed)
ANTICOAGULATION CONSULT NOTE - Initial Consult  Pharmacy Consult for warfarin Indication: atrial fibrillation  Allergies  Allergen Reactions  . Ramipril Swelling  . Testosterone Rash    Patient Measurements: Weight: (!) 306 lb (138.801 kg)   Vital Signs: Temp: 97.3 F (36.3 C) (02/21 0917) Temp Source: Oral (02/21 0917) BP: 124/81 mmHg (02/21 1013) Pulse Rate: 118 (02/21 1013)  Labs:  Recent Labs  10/25/15 0930 10/25/15 0944  HGB  --  13.9  HCT  --  42.8  PLT  --  165  APTT 35  --   LABPROT 23.2*  --   INR 2.08*  --   CREATININE  --  1.21    Estimated Creatinine Clearance: 91.3 mL/min (by C-G formula based on Cr of 1.21).   Medical History: Past Medical History  Diagnosis Date  . Essential hypertension    . Persistent atrial fibrillation ([place])    . Chronic diastolic heart failure ([place])      with mild left ventricular hypertrophy on Echo 02/2010  . Chronic pain syndrome      Left arm and leg s/p traumatic injury   . Osteoarthritis cervical spine    . Gastroesophageal reflux disease    . Open-angle glaucoma    . 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 (HCC)    . Hyperlipidemia LDL goal < 100    . Coronary artery disease      25% LAD stenosis on cath 2007.  Stable angina.  . Gout    . Right rotator cuff tear      Large full-thickness tear of the supraspinatus with mild retraction but no atrophy   . Obstructive sleep apnea      Moderate, AHI 29.8 per hour with moderately loud snoring and oxygen desaturation to a nadir of 79%. CPAP titration resulted in a prescription for 17 CWP.    [name] Cataract     Left eye  . Morbid obesity with BMI of 40.0-44.9, adult ([place])    . Chronic obstructive pulmonary disease ([place])    . Diverticulosis    . Osteoarthritis of left knee 06/19/2013    Tricompartmental disease.  Treated with double hinged upright knee brace, steroid/xylocaine knee injections, and NSAIDs   . Chronic renal insufficiency     . Alcohol abuse    . Subclinical hypothyroidism    . Normocytic anemia     Assessment: 71 yom presented to the ED with SOB. He is on chronic anticoagulation for history of afib. INR is therapeutic at 2.08. CBC is WNL and no bleeding noted.   Goal of Therapy:  INR 2-3 Monitor platelets by anticoagulation protocol: Yes   Plan:  - Warfarin 5mg  PO x 1 tonight - Daily INR  Juan Calderon, Rande Lawman 10/25/2015,12:40 PM

## 2015-10-25 NOTE — Progress Notes (Signed)
Paged Luisa Dago PA regarding pt Flecainide 100 mg due at 1530 with pt Qtc of >500 millisec. Ordered to hold dose of flecainide and continue to monitor. Pt HR stable and less than 100.

## 2015-10-25 NOTE — H&P (Signed)
Date: 10/25/2015               Patient Name:  Juan Calderon MRN: [id number]  DOB: 01/08/1956 Age / Sex: 60 y.o., male   PCP: Oval Linsey, MD         Medical Service: Internal Medicine Teaching Service         Attending Physician: Dr. Axel Filler, MD    First Contact: Dr. Blane Ohara Pager: [phone]  Second Contact: Dr. Charlott Rakes Pager: [phone]       After Hours (After 5p/  First Contact Pager: [phone]  weekends / holidays): Second Contact Pager: [phone]   Chief Complaint: Hand numbness, chest tightness, shortness of breath  History of Present Illness: Mr. Goldfine is a 60yo with HFpEF, CAD with negative Myoview 7/16, persistent AFib s/p DCCV 4/16 on flecainide and warfarin, T2DM, Gold IIA COPD, HTN, HLD, gout, and GERD who presents with hand numbness, chest tightness, and shortness of breath that started today. He has had on-and off hand numbness for 2 months. It started in his L first 3 fingers, then bilaterally, increasing in frequency and severity until when he woke this morning when it was constant. He also said he 'felt funny all over' with generalized weakness, the same feeling he had when he had DCCV in 4/16. While driving to the ED for these complaints, he began having chest tightness, shortness of breath, and diaphoresis, but he was able to walk from the parking lot to the ED. He denies other symptoms at this time, including worsening weight gain, PND, orthopnea, recent illness, fever, chest pain, cough, URI symptoms, nausea, vomiting, abdominal pain/swelling, urinary or bowel symptoms, worsening leg swelling, med noncompliance, or other issues at this time.   Meds: Current Facility-Administered Medications  Medication Dose Route Frequency Provider Last Rate Last Dose  . 0.9 %  sodium chloride infusion  250 mL Intravenous PRN Erlene Quan, PA-C      . acetaminophen (TYLENOL) tablet 650 mg  650 mg Oral Q4H PRN Erlene Quan, PA-C      . [START ON 10/26/2015]  allopurinol (ZYLOPRIM) tablet 300 mg  300 mg Oral Daily Erlene Quan, PA-C      . ALPRAZolam Duanne Moron) tablet 0.25 mg  0.25 mg Oral BID PRN Erlene Quan, PA-C      . [START ON 10/26/2015] atorvastatin (LIPITOR) tablet 10 mg  10 mg Oral Daily Luke K Kilroy, PA-C      . flecainide [facility]) tablet 100 mg  100 mg Oral Q12H Luke K Kilroy, PA-C      . furosemide (LASIX) injection 80 mg  80 mg Intravenous Once Luke K Kilroy, PA-C      . insulin aspart (novoLOG) injection 0-15 Units  0-15 Units Subcutaneous TID WC Luke K Kilroy, PA-C      . insulin aspart (novoLOG) injection 0-5 Units  0-5 Units Subcutaneous QHS Luke K Kilroy, PA-C      . [START ON 10/26/2015] irbesartan (AVAPRO) tablet 75 mg  75 mg Oral Daily Luke K Kilroy, PA-C      . latanoprost (XALATAN) 0.005 % ophthalmic solution 1 drop  1 drop Both Eyes QHS Luke K Kilroy, PA-C      . metoprolol (LOPRESSOR) injection 5 mg  5 mg Intravenous Once Leo Grosser, MD   5 mg at 10/25/15 1304  . metoprolol tartrate (LOPRESSOR) tablet 25 mg  25 mg Oral BID Doreene Burke Kilroy, PA-C      . nitroGLYCERIN (  NITROSTAT) SL tablet 0.4 mg  0.4 mg Sublingual Q5 Min x 3 PRN Luke K Kilroy, PA-C      . ondansetron [facility]) injection 4 mg  4 mg Intravenous Q6H PRN Erlene Quan, PA-C      . oxyCODONE-acetaminophen (PERCOCET) 10-325 MG per tablet 1 tablet  1 tablet Oral Q6H PRN Erlene Quan, PA-C      . [START ON 10/26/2015] pantoprazole (PROTONIX) EC tablet 40 mg  40 mg Oral Daily Luke K Kilroy, PA-C      . sodium chloride flush (NS) 0.9 % injection 3 mL  3 mL Intravenous Q12H Luke K Kilroy, PA-C      . sodium chloride flush (NS) 0.9 % injection 3 mL  3 mL Intravenous PRN Doreene Burke Kilroy, PA-C      . sorbitol 70 % solution 15-60 mL  15-60 mL Oral Daily PRN Erlene Quan, PA-C      . terazosin (HYTRIN) capsule 5 mg  5 mg Oral QHS Luke K Kilroy, PA-C      . [START ON 10/28/2015] torsemide (DEMADEX) tablet 40 mg  40 mg Oral Q M,W,F Luke K Kilroy, PA-C      . warfarin (COUMADIN)  tablet 5 mg  5 mg Oral ONCE-1800 Rachel L Rumbarger, RPH      . Warfarin - Pharmacist Dosing Inpatient   Does not apply q1800 Valeda Malm Rumbarger, RPH      . zolpidem (AMBIEN) tablet 5 mg  5 mg Oral QHS PRN,MR X 1 Luke K [place], [place]        Allergies: Allergies as of 10/25/2015 - Review Complete 10/25/2015  Allergen Reaction Noted  . Ramipril Swelling 09/18/2011  . Testosterone Rash 12/04/2011   Past Medical History  Diagnosis Date  . Essential hypertension    . Persistent atrial fibrillation ([place])    . Chronic diastolic heart failure ([place])      with mild left ventricular hypertrophy on Echo 02/2010  . Chronic pain syndrome      Left arm and leg s/p traumatic injury   . Osteoarthritis cervical spine    . Gastroesophageal reflux disease    . Open-angle glaucoma    . 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 (HCC)    . Hyperlipidemia LDL goal < 100    . Coronary artery disease      25% LAD stenosis on cath 2007.  Stable angina.  . Gout    . Right rotator cuff tear      Large full-thickness tear of the supraspinatus with mild retraction but no atrophy   . Obstructive sleep apnea      Moderate, AHI 29.8 per hour with moderately loud snoring and oxygen desaturation to a nadir of 79%. CPAP titration resulted in a prescription for 17 CWP.    [name] Cataract     Left eye  . Morbid obesity with BMI of 40.0-44.9, adult ([place])    . Chronic obstructive pulmonary disease ([place])    . Diverticulosis    . Osteoarthritis of left knee 06/19/2013    Tricompartmental disease.  Treated with double hinged upright knee brace, steroid/xylocaine knee injections, and NSAIDs   . Chronic renal insufficiency    . Alcohol abuse    . Subclinical hypothyroidism    . Normocytic anemia     Past Surgical History  Procedure Laterality Date  . Left leg surgery    . Left arm surgery    . Shoulder surgery  Right  . Fracture surgery Left 1980's    Elbow  . Cardioversion N/A  12/30/2014    Procedure: CARDIOVERSION;  Surgeon: [name], MD;  Location: [facility] ENDOSCOPY;  Service: Cardiovascular;  Laterality: N/A;   Family History  Problem Relation Age of Onset  . Heart failure Mother   . Alzheimer's disease Father   . Healthy Sister   . Healthy Brother   . Healthy Son   . Healthy Sister   . Healthy Sister   . Healthy Sister   . Healthy Sister   . Healthy Brother   . Healthy Brother   . Healthy Brother   . Heart failure Brother   . Healthy Brother   . Osteoarthritis Brother   . Prostate cancer Brother   . Early death Brother     Manufacturing systems engineer  . Heart attack Neg Hx   . Stroke Neg Hx   . Hypertension Sister   . Hypertension Brother    Social History   Social History  . Marital Status: Widowed    Spouse Name: N/A  . Number of Children: N/A  . Years of Education: N/A   Occupational History  . Not on file.   Social History Main Topics  . Smoking status: Never Smoker   . Smokeless tobacco: Never Used  . Alcohol Use: 1.0 oz/week    2 drink(s) per week     Comment: Liquor.  . Drug Use: No  . Sexual Activity: Yes    Birth Control/ Protection: None   Other Topics Concern  . Not on file   Social History Narrative   Review of Systems: Pertinent items noted in HPI and remainder of comprehensive ROS otherwise negative.  Physical Exam: Blood pressure 107/81, pulse 94, temperature 97.3 F (36.3 C), temperature source Oral, resp. rate 22, weight 306 lb (138.801 kg), SpO2 95 %.   Gen: Morbidly obese, comfortable-appearing, alert and oriented to person, place, and time HEENT: Oropharynx clear without erythema or exudate.  Neck: No cervical LAD, no thyromegaly or nodules, no JVD noted. CV: Normal rate, regular rhythm, no murmurs, rubs, or gallops Pulmonary: Normal effort, CTA bilaterally, no crackles or wheezes Abdominal: Soft, non-tender, non-distended, without rebound, guarding, or masses Extremities: Distal pulses 2+ in upper and lower  extremities bilaterally, no tenderness, erythema. Trace edema in RLE, LLE with chronic scarring and nonpitting edema Neuro: CN II-XII grossly intact, no focal weakness. Decreased sensation to fine touch in the tip of the first 3 digits bilaterally.  Lab results: Basic Metabolic Panel:  Recent Labs  10/25/15 0930 10/25/15 0944  NA  --  138  K  --  3.1*  CL  --  95*  CO2  --  29  GLUCOSE  --  420*  BUN  --  17  CREATININE  --  1.21  CALCIUM  --  9.0  MG 1.0*  --    CBC:  Recent Labs  10/25/15 0944  WBC 7.1  HGB 13.9  HCT 42.8  MCV 89.4  PLT 165   CBG:  Recent Labs  10/25/15 1036  GLUCAP 364*   Coagulation:  Recent Labs  10/25/15 0930  LABPROT 23.2*  INR 2.08*   Imaging results:  Dg Chest 2 View  10/25/2015  CLINICAL DATA:  Sob and tingling both hands since last night,hx chf EXAM: CHEST  2 VIEW COMPARISON:  02/28/2015 FINDINGS: Cardiomegaly with vascular congestion. No overt edema. No confluent opacities or effusions. No acute bony abnormality. IMPRESSION: Cardiomegaly, vascular  congestion. Electronically Signed   By: Rolm Baptise M.D.   On: 10/25/2015 09:50   Other results: EKG: atrial flutter with variable rate.  Assessment & Plan by Problem: 1. Symptomatic atrial flutter with RVR -  Most likely symptomatic aflutter, with similar symptoms before being converted to NSR with flecainide and DCCV last year. Hemodynamically stable; EKG shows aflutter with RVR, variable HR. Initially SOB but improved with diuresis, and pt is up 2 lbs from his dry weight with CXR showing only vascular congestion and cardiomegaly without other symptoms of worsening volume overload. Troponins initially negative. No viral prodrome, WBC, or infiltrate so primary infectious process unlikely. As for his hand numbness, this may be related to his above cardiac issue, but he also has T2DM and h/o chronic pain, which may -Increased home flecainide to 100mg  BID; also loaded with flec 67m IV x 1 in  ED with plans for one more IV dose this PM -Lasix 80mg  IV x 1 in ED now; diurese PRN for respiratory symptoms -F/u TSH -Cards on board; follow-up recs -Telemetry -Repeat EKG in AM; trend troponins -Continue home warfarin  2. HFpEF - does have DOE and up 2 lbs from dry weight, but otherwise no evidence for acute decompensation of his chronic HFpEF. Currently, at baseline he has DOE when getting up and walking (longest is to the end of the driveway without stopping), but not usually at rest. He 'doesn't even bother' with stairs because of symptoms. Last TTE 5/16 EF 55-60%. -Diurese as above; also continue home tosemide 40mg  MWF -Continue home BP meds -Daily weights  3. CAD - negative myoview last year -Continue home metoprolol, statin  4. HTN -Continue home terazosin, meotprolol, irbesartan  5. Gout -Continue home allopurinol  6. T2DM - last a1c 8.6 -SSI   Dispo: Disposition is deferred at this time, awaiting improvement of current medical problems. Anticipated discharge in approximately 1-3 day(s).   The patient does have a current PCP Oval Linsey, MD) and does need an [facility] hospital follow-up appointment after discharge.  The patient does not have transportation limitations that hinder transportation to clinic appointments.  Signed: Norval Gable, MD 10/25/2015, 4:01 PM

## 2015-10-25 NOTE — H&P (Signed)
Patient ID: Juan Calderon MRN: [id number], DOB/AGE: [birth year]   Admit date: 10/25/2015   Primary Physician: Karren Cobble, MD Primary Cardiologist: Dr McAlahney/ Dr [name]  HPI: 60 y/o obese, AA  male with history of diastolic CHF, minor CAD in 2007 with negative Myoview in March 2016, persistent AFib s/p Flecainide and DCCV in April 2016, COPD, HTN, HLD, gout, GERD. The pt was admitted in June 2016 with acute renal insufficiency and tremor. His Flecainide was decreased then from 150 mgh BID to 50 mg BID.  He saw Dr [name] in the fall and was doing well- in NSR. His ARB and diuretics have been resumed (stopped in June secondary dehydration, acute renal insufficiency). He is in the ED now after he woke up this am and felt like his "heart was out of rhythm". He denies SOB though his CXR shows vascular congestion. He is in atrial flutter with variable VR - 80's when I saw him.     Problem List: Past Medical History  Diagnosis Date  . Essential hypertension    . Persistent atrial fibrillation ([place])    . Chronic diastolic heart failure ([place])      with mild left ventricular hypertrophy on Echo 02/2010  . Chronic pain syndrome      Left arm and leg s/p traumatic injury   . Osteoarthritis cervical spine    . Gastroesophageal reflux disease    . Open-angle glaucoma    . 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 (HCC)    . Hyperlipidemia LDL goal < 100    . Coronary artery disease      25% LAD stenosis on cath 2007.  Stable angina.  . Gout    . Right rotator cuff tear      Large full-thickness tear of the supraspinatus with mild retraction but no atrophy   . Obstructive sleep apnea      Moderate, AHI 29.8 per hour with moderately loud snoring and oxygen desaturation to a nadir of 79%. CPAP titration resulted in a prescription for 17 CWP.    [name] Cataract     Left eye  . Morbid obesity with BMI of 40.0-44.9, adult ([place])    . Chronic obstructive pulmonary  disease ([place])    . Diverticulosis    . Osteoarthritis of left knee 06/19/2013    Tricompartmental disease.  Treated with double hinged upright knee brace, steroid/xylocaine knee injections, and NSAIDs   . Chronic renal insufficiency    . Alcohol abuse    . Subclinical hypothyroidism    . Normocytic anemia      Past Surgical History  Procedure Laterality Date  . Left leg surgery    . Left arm surgery    . Shoulder surgery      Right  . Fracture surgery Left 1980's    Elbow  . Cardioversion N/A 12/30/2014    Procedure: CARDIOVERSION;  Surgeon: [name], MD;  Location: [facility] ENDOSCOPY;  Service: Cardiovascular;  Laterality: N/A;     Allergies:  Allergies  Allergen Reactions  . Ramipril Swelling  . Testosterone Rash     Home Medications Prior to Admission medications   Medication Sig Start Date End Date Taking? Authorizing Provider  allopurinol (ZYLOPRIM) 300 MG tablet Take 1 tablet (300 mg total) by mouth daily. 05/11/15  Yes Oval Linsey, MD  atorvastatin (LIPITOR) 10 MG tablet Take 1 tablet (10 mg total) by mouth daily. 06/10/15  Yes Oval Linsey, MD  esomeprazole (  NEXIUM) 40 MG capsule Take 1 capsule (40 mg total) by mouth daily. 05/11/15  Yes Oval Linsey, MD  flecainide (TAMBOCOR) 50 MG tablet Take 1 tablet (50 mg total) by mouth every 12 (twelve) hours. 04/25/15  Yes Oval Linsey, MD  latanoprost (XALATAN) 0.005 % ophthalmic solution Place 1 drop into both eyes at bedtime. 01/27/15  Yes Oval Linsey, MD  metoprolol tartrate (LOPRESSOR) 25 MG tablet Take 1 tablet (25 mg total) by mouth 2 (two) times daily. 04/25/15  Yes Oval Linsey, MD  oxyCODONE-acetaminophen (PERCOCET) 10-325 MG tablet Take 1 tablet by mouth every 6 (six) hours as needed for pain. 09/09/15  Yes Oval Linsey, MD  sorbitol 70 % solution Take 15-60 mLs by mouth daily as needed (mix in coffee, juice, or water). 12/24/14  Yes Oval Linsey, MD  sulindac (CLINORIL) 200 MG tablet Take 200 mg by mouth 2  (two) times daily.   Yes Historical Provider, MD  terazosin (HYTRIN) 5 MG capsule Take 1 capsule (5 mg total) by mouth at bedtime. 06/24/15  Yes Oval Linsey, MD  torsemide (DEMADEX) 20 MG tablet Take 2 tablets (40 mg total) by mouth 2 (two) times a week. Patient taking differently: Take 40 mg by mouth every Monday, Wednesday, and Friday.  04/21/15  Yes Deboraha Sprang, MD  valsartan (DIOVAN) 160 MG tablet Take 1 tablet (160 mg total) by mouth daily. 09/09/15  Yes Oval Linsey, MD  warfarin (COUMADIN) 5 MG tablet Take 5 mg (1 tablet) daily except on Wednesdays and Saturdays take 2.5 mg (0.5 tablet) 09/20/15  Yes Oval Linsey, MD  nitroGLYCERIN (NITROSTAT) 0.4 MG SL tablet Place 1 tablet (0.4 mg total) under the tongue every 5 (five) minutes as needed for chest pain. 06/10/15   Oval Linsey, MD     Family History  Problem Relation Age of Onset  . Heart failure Mother   . Alzheimer's disease Father   . Healthy Sister   . Healthy Brother   . Healthy Son   . Healthy Sister   . Healthy Sister   . Healthy Sister   . Healthy Sister   . Healthy Brother   . Healthy Brother   . Healthy Brother   . Heart failure Brother   . Healthy Brother   . Osteoarthritis Brother   . Prostate cancer Brother   . Early death Brother     Manufacturing systems engineer  . Heart attack Neg Hx   . Stroke Neg Hx   . Hypertension Sister   . Hypertension Brother      Social History   Social History  . Marital Status: Widowed    Spouse Name: N/A  . Number of Children: N/A  . Years of Education: N/A   Occupational History  . Not on file.   Social History Main Topics  . Smoking status: Never Smoker   . Smokeless tobacco: Never Used  . Alcohol Use: 1.0 oz/week    2 drink(s) per week     Comment: Liquor.  . Drug Use: No  . Sexual Activity: Yes    Birth Control/ Protection: None   Other Topics Concern  . Not on file   Social History Narrative     Review of Systems: General: negative for chills, fever,  night sweats or weight changes.  Multiple somatic complaints- Lt hand numbness followed 2 days later by Rt hand numbness, Rt lateral chest wall pain Cardiovascular: negative for chest pain, dyspnea on exertion, edema, orthopnea, palpitations, paroxysmal nocturnal dyspnea or shortness  of breath HEENT: negative for any visual disturbances, blindness, glaucoma Dermatological: negative for rash Respiratory: negative for cough, hemoptysis, or wheezing Urologic: negative for hematuria or dysuria Abdominal: negative for nausea, vomiting, diarrhea, bright red blood per rectum, melena, or hematemesis Neurologic: negative for visual changes, syncope, or dizziness Musculoskeletal: negative for back pain, joint pain, or swelling Psych: cooperative and appropriate All other systems reviewed and are otherwise negative except as noted above.  Physical Exam: Blood pressure 124/81, pulse 118, temperature 97.3 F (36.3 C), temperature source Oral, resp. rate 16, weight 306 lb (138.801 kg), SpO2 96 %.  General appearance: alert, cooperative, no distress and morbidly obese Neck: no carotid bruit and no JVD Lungs: faint crackles Lt base Heart: irregularly irregular rhythm Abdomen: obese Extremities: trace edema Pulses: 2+ and symmetric Skin: Skin color, texture, turgor normal. No rashes or lesions Neurologic: Grossly normal    Labs:   Results for orders placed or performed during the hospital encounter of 10/25/15 (from the past 24 hour(s))  Protime-INR     Status: Abnormal   Collection Time: 10/25/15  9:30 AM  Result Value Ref Range   Prothrombin Time 23.2 (H) 11.6 - 15.2 seconds   INR 2.08 (H) 0.00 - 1.49  Magnesium     Status: Abnormal   Collection Time: 10/25/15  9:30 AM  Result Value Ref Range   Magnesium 1.0 (L) 1.7 - 2.4 mg/dL  APTT     Status: None   Collection Time: 10/25/15  9:30 AM  Result Value Ref Range   aPTT 35 24 - 37 seconds  I-stat troponin, ED (not at [facility])     Status:  None   Collection Time: 10/25/15  9:43 AM  Result Value Ref Range   Troponin i, poc 0.02 0.00 - 0.08 ng/mL   Comment 3          Basic metabolic panel     Status: Abnormal   Collection Time: 10/25/15  9:44 AM  Result Value Ref Range   Sodium 138 135 - 145 mmol/L   Potassium 3.1 (L) 3.5 - 5.1 mmol/L   Chloride 95 (L) 101 - 111 mmol/L   CO2 29 22 - 32 mmol/L   Glucose, Bld 420 (H) 65 - 99 mg/dL   BUN 17 6 - 20 mg/dL   Creatinine, Ser 1.21 0.61 - 1.24 mg/dL   Calcium 9.0 8.9 - 10.3 mg/dL   GFR calc non Af Amer >60 >60 mL/min   GFR calc Af Amer >60 >60 mL/min   Anion gap 14 5 - 15  CBC     Status: None   Collection Time: 10/25/15  9:44 AM  Result Value Ref Range   WBC 7.1 4.0 - 10.5 K/uL   RBC 4.79 4.22 - 5.81 MIL/uL   Hemoglobin 13.9 13.0 - 17.0 g/dL   HCT 42.8 39.0 - 52.0 %   MCV 89.4 78.0 - 100.0 fL   MCH 29.0 26.0 - 34.0 pg   MCHC 32.5 30.0 - 36.0 g/dL   RDW 13.6 11.5 - 15.5 %   Platelets 165 150 - 400 K/uL     Radiology/Studies: Dg Chest 2 View  10/25/2015  CLINICAL DATA:  Sob and tingling both hands since last night,hx chf EXAM: CHEST  2 VIEW COMPARISON:  02/28/2015 FINDINGS: Cardiomegaly with vascular congestion. No overt edema. No confluent opacities or effusions. No acute bony abnormality. IMPRESSION: Cardiomegaly, vascular congestion. Electronically Signed   By: Rolm Baptise M.D.   On: 10/25/2015 09:50  EKG: Atrial flutter with variable VR  ASSESSMENT AND PLAN:  Principal Problem:   Acute on chronic diastolic (congestive) heart failure (HCC) Active Problems:   Atrial flutter with rapid ventricular response (HCC)   Essential hypertension   PAF- NSR after Flec and DCCV April 2016   Chronic diastolic heart failure (HCC)   Alcohol abuse   Chronic anticoagulation-Couamdin   Hypokalemia   Minor CAD in '07   Body mass index (BMI) of 40.0-44.9 in adult [facility])   Obstructive sleep apnea- C-pap intol   Acute on chronic renal insufficiency-June 2016- resolved   PLAN:  Admit replace electrolites, document TSH, increase Flecainide to 100 mg BID. Given 80 mg IV in ED, will repeat tonight x 1. Check f/u BS.    Henri Medal, PA-C 10/25/2015, 11:52 AM [phone]  I have personally seen and examined this patient with Kerin Ransom, PA-C. I agree with the assessment and plan as outlined above. I know Mr. Och well. He is presenting with weakness and palpitations. He has has paroxysmal atrial fib/flutter and today is in atrial flutter with RVR. He is hypokalemic and volume overloaded. Will admit for diuresis, rate control, replacement electrolytes. Will replace K and Mg. IV Lasix for diuresis. Will increase Flecainide to 100 mg po BID. Monitor on tele. I have personally performed a physical examination and complete History and agree with all elements above.   Shirlee Whitmire 10/25/2015 12:33 PM

## 2015-10-25 NOTE — ED Notes (Signed)
Pt reports SOB worsening today. Pt states that he has numbness in his fingertips as well. Pt states that he has had increased swelling as well.

## 2015-10-26 LAB — BASIC METABOLIC PANEL
Anion gap: 11 (ref 5–15)
Anion gap: 15 (ref 5–15)
BUN: 23 mg/dL — ABNORMAL HIGH (ref 6–20)
BUN: 27 mg/dL — AB (ref 6–20)
CHLORIDE: 94 mmol/L — AB (ref 101–111)
CO2: 31 mmol/L (ref 22–32)
CO2: 29 mmol/L (ref 22–32)
CREATININE: 1.53 mg/dL — AB (ref 0.61–1.24)
Calcium: 8.7 mg/dL — ABNORMAL LOW (ref 8.9–10.3)
Calcium: 9 mg/dL (ref 8.9–10.3)
Chloride: 97 mmol/L — ABNORMAL LOW (ref 101–111)
Creatinine, Ser: 1.29 mg/dL — ABNORMAL HIGH (ref 0.61–1.24)
GFR calc Af Amer: >60 mL/min (ref >60)
GFR calc non Af Amer: 58 mL/min — ABNORMAL LOW (ref >60)
GFR, EST AFRICAN AMERICAN: 55 mL/min — AB (ref >60)
GFR, EST NON AFRICAN AMERICAN: 47 mL/min — AB (ref >60)
Glucose, Bld: 263 mg/dL — ABNORMAL HIGH (ref 65–99)
Glucose, Bld: 328 mg/dL — ABNORMAL HIGH (ref 65–99)
POTASSIUM: 3.3 mmol/L — AB (ref 3.5–5.1)
Potassium: 3.4 mmol/L — ABNORMAL LOW (ref 3.5–5.1)
SODIUM: 138 mmol/L (ref 135–145)
Sodium: 139 mmol/L (ref 135–145)

## 2015-10-26 LAB — GLUCOSE, CAPILLARY
GLUCOSE-CAPILLARY: 258 mg/dL — AB (ref 65–99)
GLUCOSE-CAPILLARY: 286 mg/dL — AB (ref 65–99)
Glucose-Capillary: 335 mg/dL — ABNORMAL HIGH (ref 65–99)
Glucose-Capillary: 289 mg/dL — ABNORMAL HIGH (ref 65–99)

## 2015-10-26 LAB — PROTIME-INR
INR: 2.22 — AB (ref 0.00–1.49)
PROTHROMBIN TIME: 24.4 s — AB (ref 11.6–15.2)

## 2015-10-26 LAB — MAGNESIUM
MAGNESIUM: 2.3 mg/dL (ref 1.7–2.4)
Magnesium: 1.3 mg/dL — ABNORMAL LOW (ref 1.7–2.4)

## 2015-10-26 LAB — HEMOGLOBIN A1C
Hgb A1c MFr Bld: 10.7 % — ABNORMAL HIGH (ref 4.8–5.6)
Mean Plasma Glucose: 260 mg/dL

## 2015-10-26 MED ORDER — POTASSIUM CHLORIDE CRYS ER 20 MEQ PO TBCR: 40.0000 meq | EXTENDED_RELEASE_TABLET | Freq: Once | ORAL | Status: DC

## 2015-10-26 MED ORDER — MAGNESIUM SULFATE 2 GM/50ML IV SOLN
2.0000 g | Freq: Once | INTRAVENOUS | Status: AC
  Administered 2015-10-26: 2 g via INTRAVENOUS
  Filled 2015-10-26: qty 50

## 2015-10-26 MED ORDER — WARFARIN SODIUM 2.5 MG PO TABS
2.5000 mg | ORAL_TABLET | Freq: Once | ORAL | Status: AC
  Administered 2015-10-26: 2.5 mg via ORAL
  Filled 2015-10-26: qty 1

## 2015-10-26 MED ORDER — POTASSIUM CHLORIDE CRYS ER 20 MEQ PO TBCR
40.0000 meq | EXTENDED_RELEASE_TABLET | Freq: Two times a day (BID) | ORAL | Status: DC
Start: 2015-10-26 — End: 2015-10-27
  Administered 2015-10-26 – 2015-10-27 (×3): 40 meq via ORAL
  Filled 2015-10-26 (×3): qty 2

## 2015-10-26 MED ORDER — INSULIN ASPART 100 UNIT/ML ~~LOC~~ SOLN
0.0000 [IU] | Freq: Every day | SUBCUTANEOUS | Status: DC
  Administered 2015-10-26: 2 [IU] via SUBCUTANEOUS

## 2015-10-26 MED ORDER — INSULIN ASPART 100 UNIT/ML ~~LOC~~ SOLN
0.0000 [IU] | Freq: Three times a day (TID) | SUBCUTANEOUS | Status: DC
  Administered 2015-10-26 – 2015-10-27 (×2): 11 [IU] via SUBCUTANEOUS
  Administered 2015-10-27: 15 [IU] via SUBCUTANEOUS

## 2015-10-26 NOTE — Progress Notes (Signed)
[place] for Warfarin Indication: atrial fibrillation  Allergies  Allergen Reactions  . Ramipril Swelling  . Testosterone Rash    Patient Measurements: Weight: 288 lb 11.2 oz (130.953 kg)   Vital Signs: Temp: 97.7 F (36.5 C) (02/22 0900) Temp Source: Oral (02/22 0900) BP: 113/98 mmHg (02/22 0900) Pulse Rate: 122 (02/22 0900)  Labs:  Recent Labs  10/25/15 0930 10/25/15 0944 10/26/15 0528  HGB  --  13.9  --   HCT  --  42.8  --   PLT  --  165  --   APTT 35  --   --   LABPROT 23.2*  --  24.4*  INR 2.08*  --  2.22*  CREATININE  --  1.21 1.29*    Estimated Creatinine Clearance: 83 mL/min (by C-G formula based on Cr of 1.29).   Medical History: Past Medical History  Diagnosis Date  . Essential hypertension    . Persistent atrial fibrillation ([place])    . Chronic diastolic heart failure ([place])      with mild left ventricular hypertrophy on Echo 02/2010  . Chronic pain syndrome      Left arm and leg s/p traumatic injury   . Osteoarthritis cervical spine    . Gastroesophageal reflux disease    . Open-angle glaucoma    . 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 (HCC)    . Hyperlipidemia LDL goal < 100    . Coronary artery disease      25% LAD stenosis on cath 2007.  Stable angina.  . Gout    . Right rotator cuff tear      Large full-thickness tear of the supraspinatus with mild retraction but no atrophy   . Obstructive sleep apnea      Moderate, AHI 29.8 per hour with moderately loud snoring and oxygen desaturation to a nadir of 79%. CPAP titration resulted in a prescription for 17 CWP.    [name] Cataract     Left eye  . Morbid obesity with BMI of 40.0-44.9, adult ([place])    . Chronic obstructive pulmonary disease ([place])    . Diverticulosis    . Osteoarthritis of left knee 06/19/2013    Tricompartmental disease.  Treated with double hinged upright knee brace, steroid/xylocaine knee injections, and NSAIDs    . Chronic renal insufficiency    . Alcohol abuse    . Subclinical hypothyroidism    . Normocytic anemia     Assessment: 20 yom presented to the ED with SOB. He is on chronic anticoagulation for history of afib. INR is therapeutic at 2.08. CBC is WNL and no bleeding noted.   INR therapeutic today  Goal of Therapy:  INR 2-3 Monitor platelets by anticoagulation protocol: Yes   Plan:  - Warfarin 2.5mg  PO x 1 tonight - Daily INR  Thank you Anette Guarneri, PharmD [phone]  10/26/2015,10:52 AM

## 2015-10-26 NOTE — Progress Notes (Signed)
RN instructed by Imts on call to hold Flecanide dose.

## 2015-10-26 NOTE — Progress Notes (Addendum)
Subjective:  He can tell his HR is not in rhythm, weak when up  Objective:  Vital Signs in the last 24 hours: Temp:  [97.7 F (36.5 C)-97.8 F (36.6 C)] 97.7 F (36.5 C) (02/22 0900) Pulse Rate:  [46-122] 122 (02/22 0900) Resp:  [15-22] 20 (02/22 0900) BP: (95-129)/(56-98) 113/98 mmHg (02/22 0900) SpO2:  [91 %-100 %] 100 % (02/22 0900) Weight:  [288 lb 5.8 oz (130.8 kg)-288 lb 11.2 oz (130.953 kg)] 288 lb 11.2 oz (130.953 kg) (02/22 0543)  Intake/Output from previous day:  Intake/Output Summary (Last 24 hours) at 10/26/15 1053 Last data filed at 10/26/15 0900  Gross per 24 hour  Intake    770 ml  Output    500 ml  Net    270 ml    Physical Exam: General appearance: alert, cooperative, no distress and morbidly obese Lungs: clear to auscultation bilaterally Heart: irregularly irregular rhythm   Rate: 80-100  Rhythm: atrial flutter  Lab Results:  Recent Labs  10/25/15 0944  WBC 7.1  HGB 13.9  PLT 165    Recent Labs  10/25/15 0944 10/26/15 0528  NA 138 139  K 3.1* 3.4*  CL 95* 97*  CO2 29 31  GLUCOSE 420* 263*  BUN 17 23*  CREATININE 1.21 1.29*   No results for input(s): TROPONINI in the last 72 hours.  Invalid input(s): CK, MB  Recent Labs  10/26/15 0528  INR 2.22*    Scheduled Meds: . allopurinol  300 mg Oral Daily  . atorvastatin  10 mg Oral Daily  . flecainide  100 mg Oral Q12H  . insulin aspart  0-15 Units Subcutaneous TID WC  . insulin aspart  0-5 Units Subcutaneous QHS  . irbesartan  75 mg Oral Daily  . latanoprost  1 drop Both Eyes QHS  . magnesium sulfate 1 - 4 g bolus IVPB  2 g Intravenous Once  . magnesium sulfate 1 - 4 g bolus IVPB  2 g Intravenous Once  . metoprolol  5 mg Intravenous Once  . metoprolol tartrate  25 mg Oral BID  . pantoprazole  40 mg Oral Daily  . potassium chloride  40 mEq Oral BID  . sodium chloride flush  3 mL Intravenous Q12H  . terazosin  5 mg Oral QHS  . [START ON 10/28/2015] torsemide  40 mg Oral Q  M,W,F  . Warfarin - Pharmacist Dosing Inpatient   Does not apply q1800   Continuous Infusions:  PRN Meds:.sodium chloride, acetaminophen, ALPRAZolam, nitroGLYCERIN, ondansetron (ZOFRAN) IV, oxyCODONE-acetaminophen **AND** oxyCODONE, sodium chloride flush, sorbitol, zolpidem   Imaging: Dg Chest 2 View  10/25/2015  CLINICAL DATA:  Sob and tingling both hands since last night,hx chf EXAM: CHEST  2 VIEW COMPARISON:  02/28/2015 FINDINGS: Cardiomegaly with vascular congestion. No overt edema. No confluent opacities or effusions. No acute bony abnormality. IMPRESSION: Cardiomegaly, vascular congestion. Electronically Signed   By: Rolm Baptise M.D.   On: 10/25/2015 09:50    Cardiac Studies:  Assessment/Plan:   Principal Problem:   Acute on chronic diastolic (congestive) heart failure (HCC) Active Problems:   Atrial flutter with rapid ventricular response (HCC)   Essential hypertension   PAF- NSR after Flec and DCCV April 2016   Chronic diastolic heart failure (HCC)   Alcohol abuse   Chronic anticoagulation-Cou[place]din   Hypokalemia   Minor CAD in '07   Body mass index (BMI) of 40.0-44.9 in adult [facility])   Obstructive sleep apnea- C-pap intol   Acute on  chronic renal insufficiency-June 2016- resolved   Acute on chronic diastolic heart failure ([place])   PLAN: He says he has not missed any doses of Coumadin. Consider DCCV tomorrow if he doesn't convert with increased Flecainide.   Kerin Ransom PA-C 10/26/2015, 10:53 AM [phone]  Patient seen and examined. I agree with the assessment and plan as detailed above. See also my additional thoughts below.   Atrial flutter continues. Low potassium was being treated. We will consider cardioversion tomorrow if he has not converted to sinus rhythm.  Dola Argyle, MD, [facility] 10/26/2015 11:03 AM   I tried to get pt scheduled for TEE CV (INR 1.8 on 10/11/15) but both Thursday and Friday are full. Hopefully he will convert with increased  Flecainide.  Kerin Ransom PA-C 10/26/2015 11:45 AM

## 2015-10-26 NOTE — Progress Notes (Signed)
On call for Juan Roberts, MD, paged concerning pts. Scheduled Flecainide. EKG completed, Pts. QTc>500 millisec. Pt. HR 80s-90s. Pt. Stable and resting in bed quietly. RN will continue to monitor pt. And await instructions.

## 2015-10-26 NOTE — Progress Notes (Signed)
Subjective: Juan Calderon had no acute events overnight. He says he is starting to feel better. He denies chest pain, palpitations, shortness of breath, or other symptoms at this time. Patient is still in flutter with variable HR (120s this AM) on telemetry.  Objective: Vital signs in last 24 hours: Filed Vitals:   10/25/15 1602 10/25/15 2042 10/25/15 2339 10/26/15 0543  BP: 115/70 109/56 118/59 115/67  Pulse: 122 109 122 121  Temp: 97.7 F (36.5 C) 97.8 F (36.6 C)    TempSrc: Oral Oral  Oral  Resp: 20   20  Weight: 288 lb 5.8 oz (130.8 kg)   288 lb 11.2 oz (130.953 kg)  SpO2: 95% 97%  96%   Weight change: no change  Intake/Output Summary (Last 24 hours) at 10/26/15 [id number] Last data filed at 10/26/15 0000  Gross per 24 hour  Intake    410 ml  Output    500 ml  Net    -90 ml     Gen: Morbidly obese, comfortable-appearing, alert and oriented to person, place, and time CV: Tachycardic rate, regular rhythm, no murmurs, rubs, or gallops Pulmonary: Normal effort, CTA bilaterally, no crackles or wheezes Abdominal: Soft, non-tender, non-distended, without rebound, guarding, or masses Extremities: Distal pulses 2+ in upper and lower extremities bilaterally, no tenderness, erythema. Trace edema in RLE, LLE with chronic scarring and nonpitting edema  Lab Results: Basic Metabolic Panel:  Recent Labs Lab 10/25/15 0930 10/25/15 0944 10/26/15 0528  NA  --  138 139  K  --  3.1* 3.4*  CL  --  95* 97*  CO2  --  29 31  GLUCOSE  --  420* 263*  BUN  --  17 23*  CREATININE  --  1.21 1.29*  CALCIUM  --  9.0 8.7*  MG 1.0*  --  1.3*   CBG:  Recent Labs Lab 10/25/15 1036 10/25/15 1613 10/25/15 2140 10/26/15 0644  GLUCAP 364* 312* 259* 258*   Thyroid Function Tests:  Recent Labs Lab 10/25/15 1324  TSH 2.492   Coagulation:  Recent Labs Lab 10/25/15 0930 10/26/15 0528  LABPROT 23.2* 24.4*  INR 2.08* 2.22*   Assessment/Plan: 1. Symptomatic atrial flutter with RVR -  Most likely symptomatic aflutter, with similar symptoms before being converted to NSR with flecainide and DCCV last year. Hemodynamically stable; EKG shows aflutter with RVR, variable HR. Initially SOB but improved with diuresis, and pt is up 2 lbs from his dry weight with CXR showing only vascular congestion and cardiomegaly without other symptoms of worsening volume overload. Troponins initially negative. No viral prodrome, WBC, or infiltrate so primary infectious process unlikely. As for his hand numbness, this may be related to his above cardiac issue, but he also has T2DM and h/o chronic pain, which may -Increased home flecainide to 100mg  BID; it appears that he didn't receive this yesterday however -Lasix 80mg  IV x 1 in ED yesterday; diurese PRN for respiratory symptoms -F/u TSH -Cards on board; follow-up recs -Telemetry -Repeat EKG in AM; trend troponins -Continue home warfarin  2. HFpEF - does have DOE and up 2 lbs from dry weight, but otherwise no evidence for acute decompensation of his chronic HFpEF. Currently, at baseline he has DOE when getting up and walking (longest is to the end of the driveway without stopping), but not usually at rest. He 'doesn't even bother' with stairs because of symptoms. Last TTE 5/16 EF 55-60%. -Diurese as above; also continue home tosemide 40mg  MWF -Continue home BP  meds -Daily weights  Dispo: Disposition is deferred at this time, awaiting improvement of current medical problems.  Anticipated discharge in approximately 1-2 day(s).   The patient does have a current PCP Juan Linsey, MD) and does need an [facility] hospital follow-up appointment after discharge.  The patient does not have transportation limitations that hinder transportation to clinic appointments.  LOS: 1 day   Juan Gable, MD 10/26/2015, 9:21 AM

## 2015-10-26 NOTE — Progress Notes (Signed)
Paged Dr. Merrilyn Puma regarding 2 gm Mag order at 1010 to confirm there is to be an additional 2 gm Mag to be given after the one is finished that was ordered at 0817 this AM for a total of 4 gm Mag delivered to pt. Dr. Merrilyn Puma confirmed order. Will administer medication to pt.

## 2015-10-27 DIAGNOSIS — I5032 Chronic diastolic (congestive) heart failure: Secondary | ICD-10-CM

## 2015-10-27 DIAGNOSIS — I483 Typical atrial flutter: Secondary | ICD-10-CM

## 2015-10-27 LAB — BASIC METABOLIC PANEL
ANION GAP: 14 (ref 5–15)
BUN: 34 mg/dL — ABNORMAL HIGH (ref 6–20)
CALCIUM: 8.9 mg/dL (ref 8.9–10.3)
CO2: 28 mmol/L (ref 22–32)
CREATININE: 1.51 mg/dL — AB (ref 0.61–1.24)
Chloride: 96 mmol/L — ABNORMAL LOW (ref 101–111)
GFR calc Af Amer: 56 mL/min — ABNORMAL LOW (ref >60)
GFR, EST NON AFRICAN AMERICAN: 48 mL/min — AB (ref >60)
Glucose, Bld: 265 mg/dL — ABNORMAL HIGH (ref 65–99)
Potassium: 4 mmol/L (ref 3.5–5.1)
SODIUM: 138 mmol/L (ref 135–145)

## 2015-10-27 LAB — GLUCOSE, CAPILLARY
GLUCOSE-CAPILLARY: 253 mg/dL — AB (ref 65–99)
GLUCOSE-CAPILLARY: 336 mg/dL — AB (ref 65–99)
Glucose-Capillary: 250 mg/dL — ABNORMAL HIGH (ref 65–99)

## 2015-10-27 LAB — PROTIME-INR
INR: 2.24 — ABNORMAL HIGH (ref 0.00–1.49)
Prothrombin Time: 24.6 seconds — ABNORMAL HIGH (ref 11.6–15.2)

## 2015-10-27 LAB — MAGNESIUM: MAGNESIUM: 2.1 mg/dL (ref 1.7–2.4)

## 2015-10-27 MED ORDER — MAGNESIUM 30 MG PO TABS: 30.0000 mg | ORAL_TABLET | Freq: Two times a day (BID) | ORAL | Status: DC

## 2015-10-27 MED ORDER — FLECAINIDE ACETATE 100 MG PO TABS: 100.0000 mg | ORAL_TABLET | Freq: Two times a day (BID) | ORAL | Status: DC

## 2015-10-27 MED ORDER — WARFARIN SODIUM 5 MG PO TABS: 5.0000 mg | ORAL_TABLET | Freq: Once | ORAL | Status: DC

## 2015-10-27 MED ORDER — METFORMIN HCL 500 MG PO TABS: 500.0000 mg | ORAL_TABLET | Freq: Every day | ORAL | Status: DC

## 2015-10-27 MED ORDER — MAGNESIUM OXIDE 400 MG PO TABS: 400.0000 mg | ORAL_TABLET | Freq: Every day | ORAL | Status: DC

## 2015-10-27 NOTE — Care Management Note (Signed)
Case Management Note  Patient Details  Name: Juan Calderon MRN: [id number] Date of Birth: Oct 03, 1955  Subjective/Objective:       Admitted with CHF             Action/Plan: Patient lives at home with friend, use a cane and a walker for mobility. Patient has scales at home but does not weighs himself daily. CM encouraged patient to weigh himself daily and the importance of doing that. Patient could benefit for [facility] Disease Management for CHF, patient refused [place] at this time but is agreeable to the [facility] CHF program; referral made as requested.  Expected Discharge Date:    10/27/2015             Expected Discharge Plan:  [place]  Discharge planning Services  CM Consult   Choice offered to:  Patient  [facility] Arranged:  Patient Refused  Status of Service:  Completed, signed off  Sherrilyn Rist [id number] 10/27/2015, 11:54 AM

## 2015-10-27 NOTE — Progress Notes (Signed)
Notified Internal Medicine, doctor on-call of patient converting from Atrial flutter to sinus rhythm and that the EKG is in the chart.

## 2015-10-27 NOTE — Progress Notes (Signed)
Subjective: [name] had no acute events overnight. His generalized malaise/weakness is continuing to improve. He denies chest pain, palpitations, shortness of breath, or other symptoms at this time. Patient converted to NSR overnight and is still in NSR.  Objective: Vital signs in last 24 hours: Filed Vitals:   10/26/15 1300 10/26/15 1306 10/26/15 2006 10/27/15 0413  BP:  96/66 119/79 93/53  Pulse:   69 73  Temp:   97.3 F (36.3 C) 97.6 F (36.4 C)  TempSrc:   Oral Oral  Resp:   18 18  Height: 6\' 1"  (1.854 m)     Weight:    291 lb 6.4 oz (132.178 kg)  SpO2:   98% 98%   Weight change: -14 lb 9.6 oz (-6.623 kg)no change  Intake/Output Summary (Last 24 hours) at 10/27/15 [id number] Last data filed at 10/26/15 2341  Gross per 24 hour  Intake    363 ml  Output   1100 ml  Net   -737 ml     Gen: Morbidly obese, comfortable-appearing, alert and oriented to person, place, and time CV: Tachycardic rate, regular rhythm, no murmurs, rubs, or gallops Pulmonary: Normal effort, CTA bilaterally, no crackles or wheezes Abdominal: Soft, non-tender, non-distended, without rebound, guarding, or masses Extremities: Distal pulses 2+ in upper and lower extremities bilaterally, no tenderness, erythema. Trace edema in RLE, LLE with chronic scarring and nonpitting edema  Lab Results: Basic Metabolic Panel:  Recent Labs Lab 10/26/15 0528 10/26/15 1536  NA 139 138  K 3.4* 3.3*  CL 97* 94*  CO2 31 29  GLUCOSE 263* 328*  BUN 23* 27*  CREATININE 1.29* 1.53*  CALCIUM 8.7* 9.0  MG 1.3* 2.3   CBG:  Recent Labs Lab 10/26/15 0644 10/26/15 1211 10/26/15 1702 10/26/15 2046 10/26/15 2338 10/27/15 0600  GLUCAP 258* 335* 286* 289* 250* 253*   Thyroid Function Tests:  Recent Labs Lab 10/25/15 1324  TSH 2.492   Coagulation:  Recent Labs Lab 10/25/15 0930 10/26/15 0528  LABPROT 23.2* 24.4*  INR 2.08* 2.22*   Assessment/Plan: 1. Symptomatic atrial flutter with RVR - Most likely  symptomatic aflutter, with similar symptoms before being converted to NSR with flecainide and DCCV last year. Hemodynamically stable; EKG shows aflutter with RVR, variable HR. Initially SOB but improved with diuresis, and pt is up 2 lbs from his dry weight with CXR showing only vascular congestion and cardiomegaly without other symptoms of worsening volume overload. Troponins initially negative. No viral prodrome, WBC, or infiltrate so primary infectious process unlikely. As for his hand numbness, this may be related to his above cardiac issue, but he also has T2DM and h/o chronic pain, which may be contributors as well. -Increased home flecainide to 100mg  BID, continue at this dose -Lasix 80mg  IV x 1 in ED; diurese PRN for respiratory symptoms -TSH normal here -Cards on board; follow-up recs -Telemetry  -Repeat EKG in AM; trend troponins -Continue home warfarin  2. HFpEF - does have DOE and up 2 lbs from dry weight, but otherwise no evidence for acute decompensation of his chronic HFpEF. Currently, at baseline he has DOE when getting up and walking (longest is to the end of the driveway without stopping), but not usually at rest. He 'doesn't even bother' with stairs because of symptoms. Last TTE 5/16 EF 55-60%. -Diurese as above; also continue home tosemide 40mg  MWF -Continue home BP meds -Daily weights  Dispo: Disposition is deferred at this time, awaiting improvement of current medical problems.  Anticipated discharge  in approximately 1-2 day(s).   The patient does have a current PCP Juan Linsey, MD) and does need an [facility] hospital follow-up appointment after discharge.  The patient does not have transportation limitations that hinder transportation to clinic appointments.  LOS: 2 days   Norval Gable, MD 10/27/2015, 7:26 AM

## 2015-10-27 NOTE — Progress Notes (Signed)
Patient taken out of hospital for discharge via wheelchair.  

## 2015-10-27 NOTE — Progress Notes (Signed)
Notified by CMT that patient had converted to sinus rhythm around 0400. Patient asymptomatic and has had no difficulty breathing. Will continue to monitor patient to end of shift.

## 2015-10-27 NOTE — Progress Notes (Signed)
     SUBJECTIVE: No complaints. NO pain or SOB.   Tele: sinus  BP 93/53 mmHg  Pulse 73  Temp(Src) 97.6 F (36.4 C) (Oral)  Resp 18  Ht 6\' 1"  (1.854 m)  Wt 291 lb 6.4 oz (132.178 kg)  BMI 38.45 kg/m2  SpO2 98%  Intake/Output Summary (Last 24 hours) at 10/27/15 0731 Last data filed at 10/26/15 2341  Gross per 24 hour  Intake    363 ml  Output   1100 ml  Net   -737 ml    PHYSICAL EXAM General: Well developed, morbidly obese male in no acute distress. Alert and oriented x 3.  Psych:  Good affect, responds appropriately Neck: No JVD. No masses noted.  Lungs: Clear bilaterally with no wheezes or rhonci noted.  Heart: RRR with no murmurs noted. Abdomen: Bowel sounds are present. Soft, non-tender.  Extremities: No lower extremity edema.   LABS: Basic Metabolic Panel:  Recent Labs  10/26/15 0528 10/26/15 1536  NA 139 138  K 3.4* 3.3*  CL 97* 94*  CO2 31 29  GLUCOSE 263* 328*  BUN 23* 27*  CREATININE 1.29* 1.53*  CALCIUM 8.7* 9.0  MG 1.3* 2.3   CBC:  Recent Labs  10/25/15 0944  WBC 7.1  HGB 13.9  HCT 42.8  MCV 89.4  PLT 165   Current Meds: . allopurinol  300 mg Oral Daily  . atorvastatin  10 mg Oral Daily  . flecainide  100 mg Oral Q12H  . insulin aspart  0-20 Units Subcutaneous TID WC  . insulin aspart  0-5 Units Subcutaneous QHS  . irbesartan  75 mg Oral Daily  . latanoprost  1 drop Both Eyes QHS  . metoprolol  5 mg Intravenous Once  . metoprolol tartrate  25 mg Oral BID  . pantoprazole  40 mg Oral Daily  . potassium chloride  40 mEq Oral BID  . sodium chloride flush  3 mL Intravenous Q12H  . terazosin  5 mg Oral QHS  . [START ON 10/28/2015] torsemide  40 mg Oral Q M,W,F  . Warfarin - Pharmacist Dosing Inpatient   Does not apply q1800     ASSESSMENT AND PLAN:  1. Chronic diastolic CHF: He was diuresed initially on admission but diuretic was held 10/26/15 due to worsened renal function. I agree that he does not appear to be volume overloaded.  Will order BMET now to check renal function.   2. Atrial flutter with RVR: Pt converted to sinus yesterday on higher dose Flecainide. He is on coumadin. INR is 2.24 this am. Will continue Flecainide and metoprolol.   3. Obesity: I think his fatigue and dyspnea is clearly worsened by his obesity and physical deconditioning. He weighed 150 lbs when he was 60 years old and now weighs nearly 300 lbs. He understands that this is contributing to his issues. We have once again discussed weight loss and exercise this am.   If BMET is ok, I think he could be discharged home today.   Fallyn Munnerlyn  2/23/20177:31 AM

## 2015-10-27 NOTE — Progress Notes (Signed)
[place] for Warfarin Indication: atrial fibrillation  Allergies  Allergen Reactions  . Ramipril Swelling  . Testosterone Rash    Patient Measurements: Height: 6\' 1"  (185.4 cm) Weight: 291 lb 6.4 oz (132.178 kg) (scale a) IBW/kg (Calculated) : 79.9   Vital Signs: Temp: 97.6 F (36.4 C) (02/23 0413) Temp Source: Oral (02/23 0413) BP: 93/53 mmHg (02/23 0413) Pulse Rate: 73 (02/23 0413)  Labs:  Recent Labs  10/25/15 0930 10/25/15 0944 10/26/15 0528 10/26/15 1536 10/27/15 0640  HGB  --  13.9  --   --   --   HCT  --  42.8  --   --   --   PLT  --  165  --   --   --   APTT 35  --   --   --   --   LABPROT 23.2*  --  24.4*  --  24.6*  INR 2.08*  --  2.22*  --  2.24*  CREATININE  --  1.21 1.29* 1.53*  --     Estimated Creatinine Clearance: 72.3 mL/min (by C-G formula based on Cr of 1.53).   Assessment: 21 yom presented to the ED with SOB. He is on chronic anticoagulation for history of afib. INR is therapeutic at 2.08. CBC is WNL and no bleeding noted.   INR therapeutic today  Goal of Therapy:  INR 2-3 Monitor platelets by anticoagulation protocol: Yes   Plan:  - Warfarin 5 mg PO x 1 tonight - Daily INR  Continue home dose at discharge - 5 mg daily except for 2.5 mg on Wednesday and Saturday  Thank you Anette Guarneri, PharmD [phone]  10/27/2015,8:56 AM

## 2015-10-27 NOTE — Progress Notes (Signed)
Home discharge instructions discussed with patient. Discussed medications, diet, activity and follow up appointments. Medication prescriptions were sent to patients pharmacy of choice by MD. Patient verbally understands instructions.

## 2015-10-27 NOTE — Progress Notes (Signed)
Utilization review completed. Laretha Luepke, RN, BSN. 

## 2015-10-28 NOTE — Discharge Summary (Signed)
Name: Juan Calderon MRN: [id number] DOB: Dec 31, 1955 60 y.o. PCP: Oval Linsey, MD  Date of Admission: 10/25/2015  9:58 AM Date of Discharge: 10/28/2015 Attending Physician: No att. providers found  Discharge Diagnosis: 1. Atrial flutter causing acute on chronic HFpEF  Principal Problem:   Acute on chronic diastolic (congestive) heart failure (HCC) Active Problems:   Essential hypertension   PAF- NSR after Flec and DCCV April 2016   Chronic diastolic heart failure (HCC)   Minor CAD in '07   Body mass index (BMI) of 40.0-44.9 in adult [facility])   Obstructive sleep apnea- C-pap intol   Alcohol abuse   Acute on chronic renal insufficiency-June 2016- resolved   Chronic anticoagulation-Couamdin   Atrial flutter with rapid ventricular response (HCC)   Hypokalemia   Acute on chronic diastolic heart failure ([place])  Discharge Medications:   Medication List    TAKE these medications        allopurinol 300 MG tablet  Commonly known as:  ZYLOPRIM  Take 1 tablet (300 mg total) by mouth daily.     atorvastatin 10 MG tablet  Commonly known as:  LIPITOR  Take 1 tablet (10 mg total) by mouth daily.     esomeprazole 40 MG capsule  Commonly known as:  NEXIUM  Take 1 capsule (40 mg total) by mouth daily.     flecainide 100 MG tablet  Commonly known as:  TAMBOCOR  Take 1 tablet (100 mg total) by mouth every 12 (twelve) hours.     latanoprost 0.005 % ophthalmic solution  Commonly known as:  XALATAN  Place 1 drop into both eyes at bedtime.     magnesium oxide 400 MG tablet  Commonly known as:  MAG-OX  Take 1 tablet (400 mg total) by mouth daily.     metFORMIN 500 MG tablet  Commonly known as:  GLUCOPHAGE  Take 1 tablet (500 mg total) by mouth daily with breakfast.     metoprolol tartrate 25 MG tablet  Commonly known as:  LOPRESSOR  Take 1 tablet (25 mg total) by mouth 2 (two) times daily.     nitroGLYCERIN 0.4 MG SL tablet  Commonly known as:  NITROSTAT  Place 1 tablet (0.4  mg total) under the tongue every 5 (five) minutes as needed for chest pain.     oxyCODONE-acetaminophen 10-325 MG tablet  Commonly known as:  PERCOCET  Take 1 tablet by mouth every 6 (six) hours as needed for pain.     sorbitol 70 % solution  Take 15-60 mLs by mouth daily as needed (mix in coffee, juice, or water).     sulindac 200 MG tablet  Commonly known as:  CLINORIL  Take 200 mg by mouth 2 (two) times daily.     terazosin 5 MG capsule  Commonly known as:  HYTRIN  Take 1 capsule (5 mg total) by mouth at bedtime.     torsemide 20 MG tablet  Commonly known as:  DEMADEX  Take 2 tablets (40 mg total) by mouth 2 (two) times a week.     valsartan 160 MG tablet  Commonly known as:  DIOVAN  Take 1 tablet (160 mg total) by mouth daily.     warfarin 5 MG tablet  Commonly known as:  COUMADIN  Take 5 mg (1 tablet) daily except on Wednesdays and Saturdays take 2.5 mg (0.5 tablet)        Disposition and follow-up:   JuanKamel M Zulauf was discharged from [facility]  in Good condition.  At the hospital follow up visit please address:  1.  Improvement in generalized weakness, shortness of breath, tolerating the increased dose of flecainide, any GI issues from starting metformin (if not, consider titrating up to 500 BID)  2.  Labs / imaging needed at time of follow-up: None  3.  Pending labs/ test needing follow-up: None  Follow-up Appointments:     Follow-up Information    Follow up with Loleta Chance, MD On 11/04/2015.   Specialty:  Internal Medicine   Why:  915AM   Contact information:   [place] [id number] [phone]       Discharge Instructions: Discharge Instructions    Diet - low sodium heart healthy    Complete by:  As directed      Increase activity slowly    Complete by:  As directed            Consultations: Treatment Team:  Rounding Lbcardiology, MD  Procedures Performed:  Dg Chest 2 View  10/25/2015  CLINICAL  DATA:  Sob and tingling both hands since last night,hx chf EXAM: CHEST  2 VIEW COMPARISON:  02/28/2015 FINDINGS: Cardiomegaly with vascular congestion. No overt edema. No confluent opacities or effusions. No acute bony abnormality. IMPRESSION: Cardiomegaly, vascular congestion. Electronically Signed   By: Rolm Baptise M.D.   On: 10/25/2015 09:50   Admission HPI: Juan Calderon is a 60yo with HFpEF, CAD with negative Myoview 7/16, persistent AFib s/p DCCV 4/16 on flecainide and warfarin, T2DM, Gold IIA COPD, HTN, HLD, gout, and GERD who presents with hand numbness, chest tightness, and shortness of breath that started today. He has had on-and off hand numbness for 2 months. It started in his L first 3 fingers, then bilaterally, increasing in frequency and severity until when he woke this morning when it was constant. He also said he 'felt funny all over' with generalized weakness, the same feeling he had when he had DCCV in 4/16. While driving to the ED for these complaints, he began having chest tightness, shortness of breath, and diaphoresis, but he was able to walk from the parking lot to the ED. He denies other symptoms at this time, including worsening weight gain, PND, orthopnea, recent illness, fever, chest pain, cough, URI symptoms, nausea, vomiting, abdominal pain/swelling, urinary or bowel symptoms, worsening leg swelling, med noncompliance, or other issues at this time.   Hospital Course by problem list: Principal Problem:   Acute on chronic diastolic (congestive) heart failure (HCC) Active Problems:   Essential hypertension   PAF- NSR after Flec and DCCV April 2016   Chronic diastolic heart failure (HCC)   Minor CAD in '07   Body mass index (BMI) of 40.0-44.9 in adult [facility])   Obstructive sleep apnea- C-pap intol   Alcohol abuse   Acute on chronic renal insufficiency-June 2016- resolved   Chronic anticoagulation-Couamdin   Atrial flutter with rapid ventricular response (HCC)   Hypokalemia    Acute on chronic diastolic heart failure ([place])   1.Atrial flutter causing acute on chronic HFpEF - he was found to be back in atrial flutter with variable rate, max 130s. He was othewise hemodynamically stable. CXR was without pulmonary edema and he did not endorse worsening of his dyspnea, although he does have class III CHF symptoms (minimal exertion) and physical exam did reveal bilateral crackles. He was essentially at his dry weight on presentation. He was given lasix 80mg  IV x 1 and was continued on his home  torsemide. Cardiology was consulted and his home flecainide dose was increased from 50mg  BID to 100mg  BID, with him converting to NSR without any other interventions. He was then discharged on the increased flecainide dose.  2.T2DM - previously well-controlled with diet alone (A1c 6's-7's), but with recent holiday dietary indiscretions, his A1c increased to 10.7. Starting metformin was recently discussed outpatient, and we started him on metformin 500mg  with plans to titrate up if tolerated at hospital follow-up.   Discharge Vitals:   BP 97/65 mmHg  Pulse 60  Temp(Src) 97.5 F (36.4 C) (Oral)  Resp 18  Ht 6\' 1"  (1.854 m)  Wt 291 lb 6.4 oz (132.178 kg)  BMI 38.45 kg/m2  SpO2 97%  Discharge Labs:  Results for orders placed or performed during the hospital encounter of 10/25/15 (from the past 24 hour(s))  Basic metabolic panel     Status: Abnormal   Collection Time: 10/27/15  8:41 AM  Result Value Ref Range   Sodium 138 135 - 145 mmol/L   Potassium 4.0 3.5 - 5.1 mmol/L   Chloride 96 (L) 101 - 111 mmol/L   CO2 28 22 - 32 mmol/L   Glucose, Bld 265 (H) 65 - 99 mg/dL   BUN 34 (H) 6 - 20 mg/dL   Creatinine, Ser 1.51 (H) 0.61 - 1.24 mg/dL   Calcium 8.9 8.9 - 10.3 mg/dL   GFR calc non Af Amer 48 (L) >60 mL/min   GFR calc Af Amer 56 (L) >60 mL/min   Anion gap 14 5 - 15  Glucose, capillary     Status: Abnormal   Collection Time: 10/27/15 11:21 AM  Result Value Ref Range    Glucose-Capillary 336 (H) 65 - 99 mg/dL    Signed: Norval Gable, MD 10/28/2015, 8:16 AM

## 2015-10-31 ENCOUNTER — Other Ambulatory Visit: Payer: Self-pay

## 2015-10-31 DIAGNOSIS — E1110 Type 2 diabetes mellitus with ketoacidosis without coma: Secondary | ICD-10-CM

## 2015-10-31 NOTE — Patient Outreach (Addendum)
[facility]) Care Management  10/31/2015  DAGEN [place]OU[place]LE 12/31/1955 [id number]  [facility] HF Program Red Alert Day #2 Weighed themselves today? No  Providers: Primary MD:  Dr. Oval Linsey  next appt:  12/23/15  Dr. Loleta Chance  Next appt:  11/04/2015 HH: none Insurance: Medicare and [place] [employer]  Social: Call completed with patient.  Patient is disabled widower and  lives in his home alone.   Mobility: ambulates with walker.  Falls: several  Transportation:  Self / still driving.  Caregiver: none  DME: walker, home scales, (denies having a CBG meter; states no one has ordered).  Motorized wheel chair order:  States paper work completed prior to admission 10/2015 by  [place]  [phone] through Dr. Eppie Gibson  [facility] conditions:  CHF, DM2 Admissions: 1 ER visits: 1 Last Admission:  10/25/2015 - 10/28/2015 Patient confirms he weighed this morning:  289 lbs   A1C: 10.7  Medications:  Patient taking more than 10 medications  Co-pay cost issues: none  Flu Vaccine: 06/10/2015 PPSVS 08/07/2013.   Plan:  [place] RN CM Referral  -Hospital Admission within the past 30 days:  Last Admission:  10/25/2015 - 10/28/2015 -Patient not checking BS readings and does not have a glucometer.  -H/o CHF, DM2, HTN -A1C 10.7  [facility] Referral  -taking more than 10 medications.     RN CM advised in next  scheduled contact call within the next 10 business days.  RN CM advised to please notify MD of any changes in condition prior to scheduled appt's.   RN CM provided contact name and # [phone] or main office # [phone] and 24-hour nurse line # [phone].  RN CM confirmed patient is aware of 911 services for urgent emergency needs.  Mariann Laster, RN, BSN, [facility], CCM  [employer] Management Coordinator [phone] Direct [phone] Cell [phone] Office [phone] Fax

## 2015-11-01 ENCOUNTER — Other Ambulatory Visit: Payer: Self-pay | Admitting: *Deleted

## 2015-11-01 NOTE — Patient Outreach (Signed)
Referral received on 2/27 from telephonic care manager to initiate transition of care program once discharged.  He was admitted with congestive heart failure, discharged on Friday, 2/24.  According to chart, member also has history of hypertension, sleep apnea, coronary disease and diabetes.  Call placed to member, this care manager introduced self and purpose of call.  He state that he has been "fine" since discharge, denies any concerns.  He state that he has been taking his medications as prescribed without difficulty.  He reports weighing self daily, with a decrease of 3 pounds since discharge (from 289 to 286 pounds).  He has a follow up appointment scheduled for 3/3, stating that he is able to transport himself to appointment.  He does report some shortness of breath with activity, but denies concern, stating that it is "better."    Member encouraged to contact this care manager with any concerns/questions.  Will continue with weekly calls next week.  Initial home visit scheduled.  Juan Calderon, BSN, [place] Management  [facility] Care Manager [phone]

## 2015-11-03 ENCOUNTER — Other Ambulatory Visit: Payer: Self-pay | Admitting: *Deleted

## 2015-11-03 NOTE — Patient Outreach (Signed)
Notified that member triggered red on EMMI heart failure dashboard.  His weight was up 4 pounds yesterday and he answered yes to new/worsening problems.  Call placed to member, he state that he is "alright" and denies concerns.  He reports that his weight today was down to 289 pounds, which was his weight at discharge.  When asked about any new or worsening problems, he denies.  He denies shortness of breath or chest discomfort.  He has a follow up appointment tomorrow, encouraged to voice any concerns with physician, and to verbalize if he does not feel he is getting back to his baseline.  He again denies concerns to this care manager, and verbalizes understanding regarding discussion with physician.  Will contact next week for weekly transition of care.  Juan Calderon, BSN, [place] Management  [facility] Care Manager [phone]

## 2015-11-03 NOTE — Patient Outreach (Signed)
[facility]) Care Management  11/03/2015  Juan Calderon [birth year] [id number]   Patient triggered RED on EMMI Heart Failure Dashboard, notification sent to [facility], RN.  Thanks, Ronnell Freshwater. [place], [place] Assistant Phone: [phone] Fax: [phone]

## 2015-11-04 ENCOUNTER — Encounter: Payer: Self-pay | Admitting: Internal Medicine

## 2015-11-04 ENCOUNTER — Ambulatory Visit (INDEPENDENT_AMBULATORY_CARE_PROVIDER_SITE_OTHER): Payer: Medicare Other | Admitting: Internal Medicine

## 2015-11-04 VITALS — BP 127/67 | HR 62 | Temp 97.8°F | Ht 73.0 in | Wt 300.9 lb

## 2015-11-04 DIAGNOSIS — I4892 Unspecified atrial flutter: Secondary | ICD-10-CM

## 2015-11-04 DIAGNOSIS — N521 Erectile dysfunction due to diseases classified elsewhere: Secondary | ICD-10-CM | POA: Diagnosis not present

## 2015-11-04 DIAGNOSIS — E1149 Type 2 diabetes mellitus with other diabetic neurological complication: Secondary | ICD-10-CM | POA: Diagnosis not present

## 2015-11-04 DIAGNOSIS — Z7984 Long term (current) use of oral hypoglycemic drugs: Secondary | ICD-10-CM | POA: Diagnosis not present

## 2015-11-04 DIAGNOSIS — E1140 Type 2 diabetes mellitus with diabetic neuropathy, unspecified: Secondary | ICD-10-CM

## 2015-11-04 DIAGNOSIS — E1165 Type 2 diabetes mellitus with hyperglycemia: Secondary | ICD-10-CM

## 2015-11-04 LAB — GLUCOSE, CAPILLARY: Glucose-Capillary: 247 mg/dL — ABNORMAL HIGH (ref 65–99)

## 2015-11-04 MED ORDER — ONETOUCH DELICA LANCETS 33G MISC: Status: DC

## 2015-11-04 MED ORDER — METFORMIN HCL 500 MG PO TABS: ORAL_TABLET | ORAL | Status: DC

## 2015-11-04 MED ORDER — GLUCOSE BLOOD VI STRP: ORAL_STRIP | Status: DC

## 2015-11-04 MED ORDER — ONETOUCH VERIO W/DEVICE KIT: 1.0000 | PACK | Freq: Every day | Status: AC

## 2015-11-04 NOTE — Progress Notes (Signed)
Patient ID: Juan Calderon, male   DOB: 1955/10/07, 60 y.o.   MRN: [id number] [facility] Subjective:   Patient ID: Juan Calderon male   DOB: 1956/03/18 60 y.o.   MRN: [id number]  HPI: JuanJai KHUSHAL Calderon is a 61 y.o. male with heart failure with preserved ejection fraction, essential hypertension, and type 2 diabetes, who was recently hospitalized for symptomatic atrial flutter, presenting to clinic for hospital follow-up of atrial flutter and type 2 diabetes.  He is not a smoker and I have reviewed his medications with him today.  Please see the assessment and plan for the status of the patient's chronic medical problems.  Review of Systems  Constitutional: Negative for fever and chills.  Respiratory: Negative for shortness of breath and wheezing.   Cardiovascular: Negative for chest pain, palpitations, orthopnea and leg swelling.  Musculoskeletal: Positive for back pain. Negative for joint pain and falls.   Objective:  Physical Exam: Filed Vitals:   11/04/15 0942  BP: 127/67  Pulse: 62  Temp: 97.8 F (36.6 C)  TempSrc: Oral  Height: 6\' 1"  (1.854 m)  Weight: 300 lb 14.4 oz (136.487 kg)  SpO2: 97%   General: obese reserved man sitting in chair Cardiac: regular rate and rhythm, no rubs, murmurs or gallops Pulm: breathing well, clear to auscultation bilaterally Ext: warm and well perfused, without pedal edema, dorsalis pedis pulses 2+ bilaterally Lymph: no cervical or supraclavicular lymphadenopathy Skin: no rash, hair, or nail changes  Assessment & Plan:  Case discussed with Dr. Lynnae January  Atrial flutter with rapid ventricular response ([place]) History: He was hospitalized with symptomatic atrial flutter, with rates in the 130s, that responded well to increasing his fleck and 9 dose from 50 mg twice daily 200 mg twice daily. Since then, he has not had any episodes of palpitations, he has a normal rhythm today, and he is not fluid overloaded. He is  anticoagulated with warfarin for paroxysmal atrial flutter.  Assessment: He is back in sinus rhythm on flecainide 100 mg twice daily, without any more symptoms or side effects.  Plan: Continue flecainide 100 mg twice daily.   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 After authorization Col[facility]er started on metformin 500 mg daily, which he has been tolerating well. He is interested in talking to Ms. Butch Penny Plyler about dietary changes.  Assessment: We have room to improve his A1c, so we will try to maximize our oral therapies.  Plan: I'll gradually up titrate his metformin from 500 mg daily to 1000 mg daily over the course of 3 weeks. He is also interested in talking to Ms. Butch Penny Plyler about dietary changes, so I have placed that referral today. We'll see him back in 6 weeks to recheck another A1c. If he remains poorly controlled, we can add another oral agent. Another consideration could be Victoza to help him lose weight.    Medications Ordered Meds ordered this encounter  Medications  . DISCONTD: metFORMIN (GLUCOPHAGE) 500 MG tablet    Sig: Take 1 pill twice daily for one week, 1 pill in the morning and 2 in the evening for 1 week, then 2 pills twice daily therafter    Dispense:  120 tablet    Refill:  0  . metFORMIN (GLUCOPHAGE) 500 MG tablet    Sig: Take 1 pill twice daily for one  week, 1 pill in the morning and 2 in the evening for 1 week, then 2 pills twice daily therafter    Dispense:  120 tablet    Refill:  3   Other Orders Orders Placed This Encounter  Procedures  . Glucose, capillary  . Ambulatory referral to diabetic education    Referral Priority:  Routine    Referral Type:  Consultation    Referral Reason:  Specialty Services Required    Number of Visits Requested:  1   Follow Up: Return in about 6 weeks (around  12/16/2015).

## 2015-11-04 NOTE — Patient Instructions (Addendum)
Juan Calderon,  It was nice to meet you today.  We have some work to do on your diabetes. I recommend taking the metformin as such:  1. This week, take 1 pill in the morning and 1 pill in the evening  2. Next week, take 1 pill in the morning and 2 pills in the evening  3. The following day, take 2 pills in the morning and 2 pills in the evening  4. After that, take 2 pills in the morning and 2 pills in the evening  If it any point during this time, you have diarrhea, you can back down on the dose.  We'll see you back in 6 weeks to check your sugar levels again.  Take care, Dr. Melburn Hake

## 2015-11-04 NOTE — Progress Notes (Signed)
Internal Medicine Clinic Attending  Case discussed with Dr. Flores at the time of the visit.  We reviewed the resident's history and exam and pertinent patient test results.  I agree with the assessment, diagnosis, and plan of care documented in the resident's note. 

## 2015-11-04 NOTE — Assessment & Plan Note (Signed)
History: His A1c in years past had been hovering around 6, shot up to 10.5 during his hospitalization a few weeks ago. After authorization Collyer started on metformin 500 mg daily, which he has been tolerating well. He is interested in talking to Ms. Butch Penny Plyler about dietary changes.  Assessment: We have room to improve his A1c, so we will try to maximize our oral therapies.  Plan: I'll gradually up titrate his metformin from 500 mg daily to 1000 mg daily over the course of 3 weeks. He is also interested in talking to Ms. Butch Penny Plyler about dietary changes, so I have placed that referral today. We'll see him back in 6 weeks to recheck another A1c. If he remains poorly controlled, we can add another oral agent. Another consideration could be Victoza to help him lose weight.

## 2015-11-04 NOTE — Assessment & Plan Note (Signed)
History: He was hospitalized with symptomatic atrial flutter, with rates in the 130s, that responded well to increasing his fleck and 9 dose from 50 mg twice daily 200 mg twice daily. Since then, he has not had any episodes of palpitations, he has a normal rhythm today, and he is not fluid overloaded. He is anticoagulated with warfarin for paroxysmal atrial flutter.  Assessment: He is back in sinus rhythm on flecainide 100 mg twice daily, without any more symptoms or side effects.  Plan: Continue flecainide 100 mg twice daily.

## 2015-11-07 ENCOUNTER — Other Ambulatory Visit: Payer: Self-pay | Admitting: *Deleted

## 2015-11-07 NOTE — Patient Outreach (Signed)
[facility]) Care Management  11/07/2015  Juan Calderon [birth year] [id number]   Patient triggered RED on EMMI Heart Failure dashboard, notification sent to Valente David, RN.  Thanks, Ronnell Freshwater. [place], [place] Assistant Phone: [phone] Fax: [phone]

## 2015-11-07 NOTE — Patient Outreach (Signed)
Weekly transition of care call place to member and call also placed due to member triggering red on the EMMI heart failure dashboard.  Member denies any shortness of breath or chest discomfort, but does state that he has been having some back pain.  He reports that he was seen by his PCP on Friday, and was told that the pain was pssibly related to a pulled muscle.  He state that he has taken some pain medicine, which has given his some relief.  He denies any new symptoms related to his heart failure.  He report that his weight today is 286 pounds, down 3 pounds from last call.  He does state that his diabetic medication was increased due to elevated blood sugar.  He denies any concerns at this time.  Will continue with weekly calls next week.  Valente David, BSN, [place] Management  [facility] Care Manager [phone]

## 2015-11-09 ENCOUNTER — Encounter: Payer: Self-pay | Admitting: *Deleted

## 2015-11-09 ENCOUNTER — Other Ambulatory Visit: Payer: Self-pay | Admitting: Pharmacist

## 2015-11-09 NOTE — Patient Outreach (Signed)
[facility]) Care Management  [place]   11/09/2015  Juan Calderon Apr 15, 1956 [id number]  Subjective: Juan Calderon is a 60 y.o. male referred to pharmacy for medication review. Reviewed patient's allergy, medication and problem list in order to perform this evaluation.  Call to speak with patient briefly regarding the medication sulindac, which is listed on his medication profile in EPIC. Patient reports that he was taking this in the past for pain, but that he is no longer using it. Also ask patient about his metformin titration. Patient denies any side effects and reports that he is now taking metformin 500 mg, 1 tablet each morning and 2 each evening.  Patient declines to review the rest of his medications at this time. Reports that he has no medication questions at this time. Patient declines to take my contact information.  Objective:   Current Medications: Current Outpatient Prescriptions  Medication Sig Dispense Refill  . allopurinol (ZYLOPRIM) 300 MG tablet Take 1 tablet (300 mg total) by mouth daily. 90 tablet 3  . atorvastatin (LIPITOR) 10 MG tablet Take 1 tablet (10 mg total) by mouth daily. 90 tablet 3  . Blood Glucose Monitoring Suppl (ONETOUCH VERIO) w/Device KIT 1 each by Does not apply route daily. 1 kit 0  . esomeprazole (NEXIUM) 40 MG capsule Take 1 capsule (40 mg total) by mouth daily. 90 capsule 3  . flecainide (TAMBOCOR) 100 MG tablet Take 1 tablet (100 mg total) by mouth every 12 (twelve) hours. 60 tablet 3  . glucose blood (ONETOUCH VERIO) test strip Use as instructed 100 each 12  . latanoprost (XALATAN) 0.005 % ophthalmic solution Place 1 drop into both eyes at bedtime. 7.5 mL 3  . magnesium oxide (MAG-OX) 400 MG tablet Take 1 tablet (400 mg total) by mouth daily. 30 tablet 3  . metFORMIN (GLUCOPHAGE) 500 MG tablet Take 1 pill twice daily for one week, 1 pill in the morning and 2 in the evening for 1 week, then 2 pills twice daily  therafter 120 tablet 3  . metoprolol tartrate (LOPRESSOR) 25 MG tablet Take 1 tablet (25 mg total) by mouth 2 (two) times daily. 180 tablet 3  . nitroGLYCERIN (NITROSTAT) 0.4 MG SL tablet Place 1 tablet (0.4 mg total) under the tongue every 5 (five) minutes as needed for chest pain. 30 tablet 11  . ONETOUCH DELICA LANCETS 76B MISC Use 1 strip daily 100 each 5  . oxyCODONE-acetaminophen (PERCOCET) 10-325 MG tablet Take 1 tablet by mouth every 6 (six) hours as needed for pain. 117 tablet 0  . sorbitol 70 % solution Take 15-60 mLs by mouth daily as needed (mix in coffee, juice, or water). 473 mL 11  . sulindac (CLINORIL) 200 MG tablet Take 200 mg by mouth 2 (two) times daily.    [name] terazosin (HYTRIN) 5 MG capsule Take 1 capsule (5 mg total) by mouth at bedtime. 90 capsule 3  . torsemide (DEMADEX) 20 MG tablet Take 2 tablets (40 mg total) by mouth 2 (two) times a week. (Patient taking differently: Take 40 mg by mouth every Monday, Wednesday, and Friday. ) 12 tablet 6  . valsartan (DIOVAN) 160 MG tablet Take 1 tablet (160 mg total) by mouth daily. 90 tablet 3  . warfarin (COUMADIN) 5 MG tablet Take 5 mg (1 tablet) daily except on Wednesdays and Saturdays take 2.5 mg (0.5 tablet) 30 tablet 1   No current facility-administered medications for this visit.   Assessment:  Drugs sorted  by system:  Cardiovascular: atorvastatin, flecainide, metoprolol, Nitrostat, torsemide, valsartan, warfarin, terazosin  Gastrointestinal: Nexium, sorbitol  Endocrine: metformin  Pain: Percocet  Vitamins/Minerals: magnesium oxide  Miscellaneous: allopurinol, latanoprost   Duplications in therapy: none noted Gaps in therapy: none noted Drug interactions:  . Allopurinol + warfarin: allopurinol may enhance the anticoagulant effect of warfarin. Neither of these maintenance medications in new to the patient. Patient's anticoagulation clinic is aware that he is taking allopurinol. Other issues: . Per EPIC, last A1C on  10/25/15 was 10.7%. Per note from patient's PCP's office on 11/04/15, following patient's A1C closely, referring to dietitian and titrating metformin dose up.  Plan:  No significant medication issues noted. Will close pharmacy episode at this time.  Harlow Asa, PharmD Clinical Pharmacist [place] Management [phone]

## 2015-11-15 ENCOUNTER — Other Ambulatory Visit: Payer: Self-pay | Admitting: *Deleted

## 2015-11-15 NOTE — Patient Outreach (Signed)
Weekly transition of care call placed to member.  Notification also received that member was flagged red on EMMI heart failure dashboard yesterday.  Member states that he is "good", denies shortness of breath or any other concerns. Questioned about weight gain (reason flagged red.[name]..283 pounds on Sunday, 288 pounds on Monday).  Member states that he has not contacted his physician because his weight decreased back down to 286 today.  Discussed heart failure zones again and when to contact his physician.  He verbalizes that he is aware of the zones, and verbalizes understanding, but states again that he did not call because his weight went down.    Member has had follow up appointment with his PCP, but has not had a follow up with his cardiologist.  He states that his cardiology appointment is "not for a while"   Advised member to call to get appointment moved up to an earlier date, member states that he prefers to keep his original appointment.  Discussed the importance of follow up appointment with cardiologist in effort to effectively manage heart failure, member again state that he will wait for his original appointment.    He denies any concerns at this time, will continue with weekly calls next week.  Valente David, BSN, [place] Management  [facility] Care Manager [phone]

## 2015-11-15 NOTE — Patient Outreach (Signed)
[place] [facility]) Care Management  11/15/2015  DANIELJAMES HALFPENNY [birth year] [id number]   Patient triggered RED on EMMI Heart Failure dashboard, notification sent to  [facility], RN.  Thanks, Ronnell Freshwater. [place], [place] Assistant Phone: [phone] Fax: [phone]

## 2015-11-16 ENCOUNTER — Ambulatory Visit (INDEPENDENT_AMBULATORY_CARE_PROVIDER_SITE_OTHER): Payer: Medicare Other | Admitting: *Deleted

## 2015-11-16 DIAGNOSIS — I4891 Unspecified atrial fibrillation: Secondary | ICD-10-CM

## 2015-11-16 DIAGNOSIS — Z7901 Long term (current) use of anticoagulants: Secondary | ICD-10-CM

## 2015-11-16 DIAGNOSIS — Z5181 Encounter for therapeutic drug level monitoring: Secondary | ICD-10-CM

## 2015-11-16 LAB — POCT INR: INR: 2.2

## 2015-11-17 ENCOUNTER — Ambulatory Visit: Payer: Medicare Other | Admitting: *Deleted

## 2015-11-22 ENCOUNTER — Other Ambulatory Visit: Payer: Self-pay | Admitting: *Deleted

## 2015-11-22 ENCOUNTER — Other Ambulatory Visit: Payer: Self-pay | Admitting: Internal Medicine

## 2015-11-22 NOTE — Patient Outreach (Signed)
[place] [facility]) Care Management  11/22/2015  Juan Calderon 03-Mar-1956 [id number]   Patient triggered RED on EMMI Heart Failure dashboard, notification sent to Valente David, RN.  Thanks, Ronnell Freshwater. [place], [place] Assistant Phone: [phone] Fax: [phone]

## 2015-11-22 NOTE — Patient Outreach (Signed)
Weekly transition of care call placed to member.  Member also appeared red on EMMI congestive heart failure dashboard, weight yesterday was up 3 pounds from day before.  Member states that he is "doing."  When asked to elaborate he states that he has been "fine."  He reports some shortness of breath and swelling at times but "none more than usual."  Discussed again as last week the importance of seeing his cardiologist as well as his PCP to manager his heart failure.  He states that it is not time for his 6 month follow up and "they will call me for an appointment."  Encouraged to contact them instead of waiting for a call, he states "I can do that."  Member state that he has been following his low sodium diet and all other instructions, denies any questions or concerns.  Initial home visit confirmed for Monday.  Valente David, BSN, [place] Management  [facility] Care Manager [phone]

## 2015-11-24 NOTE — Patient Outreach (Signed)
[name]) Care Management  11/24/2015  Juan Calderon 1956/07/13 [id number]   Patient triggered RED on EMMI Heart Failure dashboard, notification sent to Valente David, RN.  Thanks, Ronnell Freshwater. [place], [place] Assistant Phone: [phone] Fax: [phone]

## 2015-11-28 ENCOUNTER — Other Ambulatory Visit: Payer: Self-pay | Admitting: *Deleted

## 2015-11-28 ENCOUNTER — Encounter: Payer: Self-pay | Admitting: *Deleted

## 2015-11-28 NOTE — Patient Outreach (Signed)
[place] [facility]) Care Management   11/28/2015  Juan Calderon 06/27/1956 [id number]  Juan Calderon is an 60 y.o. male  Subjective:   Member reports that he is "doing."  He denies pain at this time, and state that his shortness of breath with activity is "no more than usual."  He states he takes all of his medications as prescribed.  Objective:   Review of Systems  Constitutional: Negative.   HENT: Negative.   Eyes: Negative.   Respiratory: Negative.   Cardiovascular: Positive for leg swelling.  Gastrointestinal: Negative.   Genitourinary: Negative.   Musculoskeletal: Negative.   Skin: Negative.   Neurological: Negative.   Endo/Heme/Allergies: Negative.   Psychiatric/Behavioral: Negative.     Physical Exam  Constitutional: He is oriented to person, place, and time. He appears well-developed and well-nourished.  Neck: Normal range of motion.  Cardiovascular: Normal rate, regular rhythm and normal heart sounds.   Respiratory: Effort normal and breath sounds normal.  GI: Soft. Bowel sounds are normal.  Musculoskeletal: Normal range of motion.  Neurological: He is alert and oriented to person, place, and time.  Skin: Skin is warm and dry.    Current Medications:   Current Outpatient Prescriptions  Medication Sig Dispense Refill  . allopurinol (ZYLOPRIM) 300 MG tablet Take 1 tablet (300 mg total) by mouth daily. 90 tablet 3  . atorvastatin (LIPITOR) 10 MG tablet Take 1 tablet (10 mg total) by mouth daily. 90 tablet 3  . Blood Glucose Monitoring Suppl (ONETOUCH VERIO) w/Device KIT 1 each by Does not apply route daily. 1 kit 0  . esomeprazole (NEXIUM) 40 MG capsule Take 1 capsule (40 mg total) by mouth daily. 90 capsule 3  . flecainide (TAMBOCOR) 100 MG tablet Take 1 tablet (100 mg total) by mouth every 12 (twelve) hours. 60 tablet 3  . glucose blood (ONETOUCH VERIO) test strip Use as instructed 100 each 12  . latanoprost (XALATAN) 0.005 % ophthalmic solution  Place 1 drop into both eyes at bedtime. 7.5 mL 3  . magnesium oxide (MAG-OX) 400 MG tablet Take 1 tablet (400 mg total) by mouth daily. 30 tablet 3  . metFORMIN (GLUCOPHAGE) 500 MG tablet Take 1 pill twice daily for one week, 1 pill in the morning and 2 in the evening for 1 week, then 2 pills twice daily therafter 120 tablet 3  . metoprolol tartrate (LOPRESSOR) 25 MG tablet Take 1 tablet (25 mg total) by mouth 2 (two) times daily. 180 tablet 3  . nitroGLYCERIN (NITROSTAT) 0.4 MG SL tablet Place 1 tablet (0.4 mg total) under the tongue every 5 (five) minutes as needed for chest pain. 30 tablet 11  . ONETOUCH DELICA LANCETS 37C MISC Use 1 strip daily 100 each 5  . oxyCODONE-acetaminophen (PERCOCET) 10-325 MG tablet Take 1 tablet by mouth every 6 (six) hours as needed for pain. 117 tablet 0  . sorbitol 70 % solution Take 15-60 mLs by mouth daily as needed (mix in coffee, juice, or water). 473 mL 11  . terazosin (HYTRIN) 5 MG capsule Take 1 capsule (5 mg total) by mouth at bedtime. 90 capsule 3  . torsemide (DEMADEX) 20 MG tablet Take 2 tablets (40 mg total) by mouth 2 (two) times a week. (Patient taking differently: Take 40 mg by mouth every Monday, Wednesday, and Friday. ) 12 tablet 6  . valsartan (DIOVAN) 160 MG tablet Take 1 tablet (160 mg total) by mouth daily. 90 tablet 3  . warfarin (COUMADIN) 5 MG  tablet Take 5 mg (1 tablet) daily except on Wednesdays and Saturdays take 2.5 mg (0.5 tablet) 30 tablet 1   No current facility-administered medications for this visit.    Functional Status:   In your present state of health, do you have any difficulty performing the following activities: 11/28/2015 11/04/2015  Hearing? N N  Vision? N N  Difficulty concentrating or making decisions? N N  Walking or climbing stairs? Y Y  Dressing or bathing? N N  Doing errands, shopping? N N  Preparing Food and eating ? N -  Using the Toilet? N -  In the past six months, have you accidently leaked urine? N -  Do  you have problems with loss of bowel control? N -  Managing your Medications? N -  Managing your Finances? N -  Housekeeping or managing your Housekeeping? N -    Fall/Depression Screening:    PHQ 2/9 Scores 11/28/2015 11/04/2015 10/31/2015 06/10/2015 05/25/2015 03/09/2015 12/24/2014  PHQ - 2 Score 0 0 0 0 0 0 0   Fall Risk  11/28/2015 11/04/2015 10/31/2015 06/10/2015 05/25/2015  Falls in the past year? _0   Number falls in past yr: 2 or more 2 or more 2 or more 1 1  Injury with Fall? No No No Yes Yes  Risk Factor Category  _1   Risk for fall due to : - History of fall(s);Impaired balance/gait;Impaired mobility History of fall(s);Impaired balance/gait;Impaired mobility History of fall(s);Impaired vision -  Risk for fall due to (comments): - leg pain - - -  Follow up Falls prevention discussed Falls prevention discussed Education provided;Falls prevention discussed Education provided Education provided    Assessment:    Met with member at scheduled time, he denies any concerns.  He continues to be very short and provide minimal information during initial assessment.  He state that he does weigh himself daily, but does not contact the physician with any weight change because "it'll just go back down the next day."  He is provided with heart failure zones, which he states he has had in the past.  Zones discussed, made aware of when to contact physician.  He verbalizes understanding.  Member state that he does not like receiving daily EMMI calls and want them to stop, stating "they are getting on my nerves."  Discussed the importance of the EMMI heart failure program, he states "I've gone along with it as long as I could."  Will notify care management assistant.  Member has history of diabetes, which he reports was controlled until a few months ago.  He is aware that his A1C has increased and state "I just need to go back to  doing what I was doing."  This care manager asked the member to elaborate, he states "It was controlled with my eating."  Again asked to elaborate, he states "I was eating pretty much of nothing."  He is unwilling to elaborate on specific diet details.  He does state that he also follows a low salt diet.  Member made aware that his transition of care program would be ending this week, offer made to continue services with either this care manager or telephonic health coach (ongoing education).  Member denies the need for ongoing services, stating again "I just need to go back to doing what I ws doing."  He does express concern for finding a resource that would help him financially  with purchasing a Conservation officer, nature.  He states he had one at his old home, but is in need of a new one.  He state that he has the prescription for one, but is unable to pay full price.  He has identified a place that will take payments, but he is unable to get the chair until it is paid for in full.  He is looking for a resource that will allow him to have the chair as he is paying on it.  He denies any other concerns at this time.  He is aware that this care manger will close case once information is provided.    Plan:   Will contact social worker regarding possible resources for assistance in purchasing lift chair.   Will follow up with member later this week regarding chair, will close case at that time if no other concerns are voiced.  THN CM Care Plan Problem One        Most Recent Value   Care Plan Problem One  Recent Hospitalization   Role Documenting the Problem One  Care Management Coordinator   Care Plan for Problem One  Active   THN Long Term Goal (31-90 days)  Member will not be readmitted to hospital within the next 31 days   THN Long Term Goal Start Date  11/01/15   [facility] Long Term Goal Met Date  11/29/15   Interventions for Problem One Long Term Goal  Discussed with member the importance of following  discharge instructions, including follow up appointments, medications, diet, and home health involvement, to decrease the risk of readmission   THN CM Short Term Goal #1 (0-30 days)  Member will have follow up appointment within the next 2 weeks   THN CM Short Term Goal #1 Start Date  11/01/15   [facility] CM Short Term Goal #1 Met Date  11/04/15   Interventions for Short Term Goal #1  Discussed with member the importance of following discharge instructions, including follow up appointments, medications, diet, and home health involvement, to decrease the risk of readmission   THN CM Short Term Goal #2 (0-30 days)  Member will report taking all medications as prescribed over the next 4 weeks   THN CM Short Term Goal #2 Start Date  11/01/15   [facility] CM Short Term Goal #2 Met Date  11/28/15   Interventions for Short Term Goal #2  Discussed with member the importance of following discharge instructions, including follow up appointments, medications, diet, and home health involvement, to decrease the risk of readmission     Valente David, BSN, [place] Manager [phone]

## 2015-11-29 ENCOUNTER — Encounter: Payer: Self-pay | Admitting: *Deleted

## 2015-11-29 ENCOUNTER — Other Ambulatory Visit: Payer: Self-pay | Admitting: *Deleted

## 2015-11-29 NOTE — Patient Outreach (Signed)
Call placed to member concerning assistance with lift chair.  According to Education officer, museum, there are no resources that will assist with payment of the chair due to the cost.  Payment arrangements are possible, but the chair will have to be paid in full prior to it being delivered.  Member is aware of this and is able to make payment arrangements, but does not want to wait until chair is paid for to begin use.  He state his son is going to look at his old chair next week to determine if he is able to use it.  He denies any further concerns at this time, continue to deny need for ongoing involvement.  Will notify care management assistant and PCP of case closure.  Valente David, BSN, [place] Management  [facility] Care Manager [phone]

## 2015-12-06 DIAGNOSIS — M1712 Unilateral primary osteoarthritis, left knee: Secondary | ICD-10-CM | POA: Diagnosis not present

## 2015-12-11 ENCOUNTER — Other Ambulatory Visit: Payer: Self-pay | Admitting: Internal Medicine

## 2015-12-12 ENCOUNTER — Encounter: Payer: Self-pay | Admitting: Nurse Practitioner

## 2015-12-13 ENCOUNTER — Encounter (HOSPITAL_COMMUNITY): Payer: Self-pay | Admitting: *Deleted

## 2015-12-14 ENCOUNTER — Ambulatory Visit (INDEPENDENT_AMBULATORY_CARE_PROVIDER_SITE_OTHER): Payer: Medicare Other | Admitting: *Deleted

## 2015-12-14 ENCOUNTER — Other Ambulatory Visit: Payer: Self-pay | Admitting: Internal Medicine

## 2015-12-14 DIAGNOSIS — I4891 Unspecified atrial fibrillation: Secondary | ICD-10-CM

## 2015-12-14 DIAGNOSIS — Z5181 Encounter for therapeutic drug level monitoring: Secondary | ICD-10-CM

## 2015-12-14 DIAGNOSIS — Z7901 Long term (current) use of anticoagulants: Secondary | ICD-10-CM | POA: Diagnosis not present

## 2015-12-14 LAB — POCT INR: INR: 1.7

## 2015-12-15 ENCOUNTER — Other Ambulatory Visit: Payer: Self-pay | Admitting: Pharmacist

## 2015-12-15 DIAGNOSIS — I4891 Unspecified atrial fibrillation: Secondary | ICD-10-CM

## 2015-12-15 MED ORDER — WARFARIN SODIUM 5 MG PO TABS: ORAL_TABLET | ORAL | Status: DC

## 2015-12-15 NOTE — Telephone Encounter (Signed)
Pt called to report he missed a few doses of Coumadin. He was in yesterday for an INR check and thought he was taking his Coumadin, but got home and realized it was a different medication in his pill box. He also needed a refill. Rx was sent in, advised pt to take an extra 1/2 tab today and tomorrow then resume usual dose.

## 2015-12-23 ENCOUNTER — Ambulatory Visit (INDEPENDENT_AMBULATORY_CARE_PROVIDER_SITE_OTHER): Payer: Medicare Other | Admitting: Dietician

## 2015-12-23 ENCOUNTER — Encounter: Payer: Self-pay | Admitting: Internal Medicine

## 2015-12-23 ENCOUNTER — Ambulatory Visit (INDEPENDENT_AMBULATORY_CARE_PROVIDER_SITE_OTHER): Payer: Medicare Other | Admitting: Internal Medicine

## 2015-12-23 VITALS — BP 119/75 | HR 61 | Temp 97.7°F | Wt 289.5 lb

## 2015-12-23 DIAGNOSIS — T402X5D Adverse effect of other opioids, subsequent encounter: Secondary | ICD-10-CM

## 2015-12-23 DIAGNOSIS — G4733 Obstructive sleep apnea (adult) (pediatric): Secondary | ICD-10-CM

## 2015-12-23 DIAGNOSIS — L97929 Non-pressure chronic ulcer of unspecified part of left lower leg with unspecified severity: Secondary | ICD-10-CM | POA: Diagnosis not present

## 2015-12-23 DIAGNOSIS — E119 Type 2 diabetes mellitus without complications: Secondary | ICD-10-CM | POA: Diagnosis not present

## 2015-12-23 DIAGNOSIS — T1490 Injury, unspecified: Secondary | ICD-10-CM

## 2015-12-23 DIAGNOSIS — N521 Erectile dysfunction due to diseases classified elsewhere: Secondary | ICD-10-CM | POA: Diagnosis not present

## 2015-12-23 DIAGNOSIS — Z713 Dietary counseling and surveillance: Secondary | ICD-10-CM | POA: Diagnosis not present

## 2015-12-23 DIAGNOSIS — I110 Hypertensive heart disease with heart failure: Secondary | ICD-10-CM

## 2015-12-23 DIAGNOSIS — I480 Paroxysmal atrial fibrillation: Secondary | ICD-10-CM | POA: Diagnosis not present

## 2015-12-23 DIAGNOSIS — I10 Essential (primary) hypertension: Secondary | ICD-10-CM

## 2015-12-23 DIAGNOSIS — Z79899 Other long term (current) drug therapy: Secondary | ICD-10-CM | POA: Diagnosis not present

## 2015-12-23 DIAGNOSIS — E1165 Type 2 diabetes mellitus with hyperglycemia: Secondary | ICD-10-CM | POA: Diagnosis not present

## 2015-12-23 DIAGNOSIS — E1149 Type 2 diabetes mellitus with other diabetic neurological complication: Secondary | ICD-10-CM | POA: Diagnosis not present

## 2015-12-23 DIAGNOSIS — E1140 Type 2 diabetes mellitus with diabetic neuropathy, unspecified: Secondary | ICD-10-CM

## 2015-12-23 DIAGNOSIS — Z7901 Long term (current) use of anticoagulants: Secondary | ICD-10-CM

## 2015-12-23 DIAGNOSIS — I2510 Atherosclerotic heart disease of native coronary artery without angina pectoris: Secondary | ICD-10-CM

## 2015-12-23 DIAGNOSIS — I25119 Atherosclerotic heart disease of native coronary artery with unspecified angina pectoris: Secondary | ICD-10-CM

## 2015-12-23 DIAGNOSIS — I83029 Varicose veins of left lower extremity with ulcer of unspecified site: Secondary | ICD-10-CM | POA: Diagnosis not present

## 2015-12-23 DIAGNOSIS — I5032 Chronic diastolic (congestive) heart failure: Secondary | ICD-10-CM | POA: Diagnosis not present

## 2015-12-23 DIAGNOSIS — M1732 Unilateral post-traumatic osteoarthritis, left knee: Secondary | ICD-10-CM

## 2015-12-23 DIAGNOSIS — X58XXXS Exposure to other specified factors, sequela: Secondary | ICD-10-CM

## 2015-12-23 DIAGNOSIS — Z7984 Long term (current) use of oral hypoglycemic drugs: Secondary | ICD-10-CM

## 2015-12-23 DIAGNOSIS — K219 Gastro-esophageal reflux disease without esophagitis: Secondary | ICD-10-CM | POA: Diagnosis not present

## 2015-12-23 DIAGNOSIS — M47812 Spondylosis without myelopathy or radiculopathy, cervical region: Secondary | ICD-10-CM | POA: Diagnosis not present

## 2015-12-23 DIAGNOSIS — G894 Chronic pain syndrome: Secondary | ICD-10-CM

## 2015-12-23 DIAGNOSIS — K5903 Drug induced constipation: Secondary | ICD-10-CM | POA: Diagnosis not present

## 2015-12-23 DIAGNOSIS — T402X5A Adverse effect of other opioids, initial encounter: Secondary | ICD-10-CM

## 2015-12-23 LAB — POCT GLYCOSYLATED HEMOGLOBIN (HGB A1C): Hemoglobin A1C: 9

## 2015-12-23 LAB — GLUCOSE, CAPILLARY: Glucose-Capillary: 110 mg/dL — ABNORMAL HIGH (ref 65–99)

## 2015-12-23 MED ORDER — FLECAINIDE ACETATE 100 MG PO TABS: 100.0000 mg | ORAL_TABLET | Freq: Two times a day (BID) | ORAL | Status: DC

## 2015-12-23 MED ORDER — MAGNESIUM OXIDE 400 MG PO TABS: 400.0000 mg | ORAL_TABLET | Freq: Every day | ORAL | Status: DC

## 2015-12-23 MED ORDER — OXYCODONE-ACETAMINOPHEN 10-325 MG PO TABS: 1.0000 | ORAL_TABLET | Freq: Four times a day (QID) | ORAL | Status: DC | PRN

## 2015-12-23 MED ORDER — TORSEMIDE 20 MG PO TABS: ORAL_TABLET | ORAL | Status: DC

## 2015-12-23 MED ORDER — METFORMIN HCL 1000 MG PO TABS: 1000.0000 mg | ORAL_TABLET | Freq: Two times a day (BID) | ORAL | Status: DC

## 2015-12-23 MED ORDER — GLIPIZIDE 5 MG PO TABS: 5.0000 mg | ORAL_TABLET | Freq: Every day | ORAL | Status: DC

## 2015-12-23 NOTE — Assessment & Plan Note (Signed)
Assessment  The osteoarthritis of his spine is likely progressing given that he is now developing numbness in his thumb, pointer, and middle finger on the right hand, pain from the right shoulder down the right lateral and posterior arm, and pain and numbness on the dorsal aspect of the right occiput associated with numbness.  Plan  He was referred for nerve conduction studies to assess for significance of neurologic damage. On examination, his musculature in his right hand is intact and appropriately bulky. He has numerous comorbidities that would make surgical decompression of a cervical myelopathy somewhat risky. Therefore, we want to be sure there is neurologic evidence that the benefits of intervention may outweigh the risks. We will make further decisions on the management of his potential cervical myelopathy after receiving the results of this study.

## 2015-12-23 NOTE — Assessment & Plan Note (Signed)
Assessment  On exam today he was found to have a regular rhythm and a regular rate. I am assuming he is in normal sinus rhythm at this time. He is tolerating the flecainide 100 mg by mouth twice daily well. He continues anticoagulation with follow-up in the anticoagulation clinic.  Plan  We will continue the flecainide at 100 mg by mouth twice daily, metoprolol 25 mg by mouth twice daily should he flipped back into atrial fibrillation, and warfarin as managed by the anticoagulation clinic with a target INR of between 2 and 3. We will reassess for symptoms or signs of reversion to atrial fibrillation at the follow-up visit.

## 2015-12-23 NOTE — Assessment & Plan Note (Signed)
Assessment  He is asymptomatic on the Nexium 40 mg by mouth daily.  Plan  We will continue the Nexium at 40 mg by mouth daily and reassess for symptoms consistent with gastroesophageal reflux disease at the follow-up visit.

## 2015-12-23 NOTE — Assessment & Plan Note (Signed)
Other than the Zostavax which he is currently unable to afford, he is up-to-date on his health care maintenance.

## 2015-12-23 NOTE — Patient Instructions (Signed)
It was nice to see you again.  1) Your diabetes remains out of control.  Keep working on diet and exercise as best as you can.  Keep taking the metformin like you are.  We started the glipizide at 5 mg daily which should also help your sugars.  2) We will start the process of the power wheelchair.  3) Keep taking the other medications as you are.  I will convert all of them that I can to 90 day supplies if not tonight, this weekend.  I will see you back in 3 months, sooner if necessary.

## 2015-12-23 NOTE — Assessment & Plan Note (Signed)
Assessment  He continues to have bilateral venous insufficiency, left greater than right given the previous left lower extremity trauma. He was unable to afford the compression stockings that were prescribed at the last visit. When he has the money to afford them he will purchase them.  Plan  We'll reassess his ability to purchase the compression stockings that were prescribed at the last visit when he is seen at the next visit.

## 2015-12-23 NOTE — Assessment & Plan Note (Signed)
Assessment  His chronic diastolic heart failure is symptomatically well controlled on his current regimen of metoprolol 25 mg by mouth twice daily, valsartan 160 mg by mouth daily, and torsemide 40 mg by mouth 3 times a week. His weight is stable compared to his discharge weight at 289.  Plan  We will continue the metoprolol, valsartan, and torsemide at the current doses. We stressed the importance of watching his sodium intake. He is to continue daily weights. We will reassess his chronic diastolic heart failure symptoms at the follow-up visit.

## 2015-12-23 NOTE — Assessment & Plan Note (Signed)
Assessment  He continues to have significant pain in his left knee but also in his right knee pain despite injection therapy recently by an orthopedic surgeon. He is unsteady in his gait and requires use of a walker in the home. He is concerned about the potential for falls. This also hinders his ability to get about his house and around his house. He inquired about a power wheelchair. When asked if he was able to use a manual wheelchair he stated he could not based on the pain associated with his left upper extremity traumatic injury as well as his right shoulder disease, namely the rotator cuff tear.  Plan  We will initiate the paperwork as well as the required visit for formal assessment for candidacy for a power wheelchair.

## 2015-12-23 NOTE — Assessment & Plan Note (Signed)
Assessment  His opioid associated constipation is reasonably well managed with sorbitol 70%.  Plan  We will continue the sorbitol 70% as needed for constipation. We will reassess the control of his constipation at the follow-up visit.

## 2015-12-23 NOTE — Progress Notes (Signed)
  Medical Nutrition Therapy:  Appt start time: 1330 end time:  1400. Visit # 2/last visit was 01/2015  Assessment:  Primary concerns today: weight loss and diabetes control.  Congratulated patient for weight loss of 20# in past year. He says he is not sure how he lost weight, although he may be eating more vegetables. He is not interested in a meal plan or group support/visit. He has a lady friend with diabetes who provided him self management support. He sees her about every other month and many of his friends and family have diabetes. He mentioned that he may try to cut back on soda but did not elaborate or want help in planning out how to make that happen. He does not cook often. He food shops 1x/week at [employer].  He also plans to pull out his old diabetes books from when he was diagnosed and see if he can find the phone number of the company that used to send him his meals to help him eat right. He denied questions or concerns. Preferred Learning Style: No preference indicated  Learning Readiness: Contemplating  ANTHROPOMETRICS: weight-289#, BMI-39 decreased from 42 last year WEIGHT HISTORY:272 to 312# over past 5-10 years SLEEP:he says he sleeps well for 5 hours, wakes uses bathroom and goes back to sleep for a few more hours. MEDICATIONS: tolerating metformin, starting glipizide BLOOD SUGAR:A1C increased to 9% DIETARY INTAKE: Usual eating pattern includes 2 meals per day and 1 snack. Everyday foods include  Avoided foods include none mentioned.   24-hr recall:  6-630 am drinks 1 cup coffee Meal 1 ( 11 AM -12 PM): cheerios and 2% milk, sometimes an egg  L (5-6 PM): lunch meat sandwich, chicken wings and salad Snk ( PM): 12 oz milk to take medicine  Beverages: little water, mostly sprite, half sweet tea, milk and coffee  Usual physical activity: limited by pain and chronic conditions  Estimated daily energy needs  2,940 Calories/day to maintain weight.   2,440 Calories/day to lose 1  lb per week.   1,940 Calories/day to lose 2 lb per week.  Progress Towards Goal(s):  In progress.   Nutritional Diagnosis:  NB 1.1 Food and Nutrition related knowledge deficit as related to lack of sufficient prior exposure to diabetes and weight loss meal planning as evidenced by his report and request for assistance with ideas to help him achieve his goals.  NB-1.3 Not ready for diet/lifestyle change  As related to competing values. Diagnosis deleted  As evidenced by patient now contemplating changes to work on weight loss and daibetes.    Intervention:  Nutrition education about possible ways he could cut calories that should help with weight loss. The plate method of meal planning. Eduation of action of glipizide and timing with meals Coordination of care:   Teaching Method Utilized: Visual, Auditory, Hands on Handouts given during visit include: the plate method for meal planning Barriers to learning/adherence to lifestyle change: lack of support Demonstrated degree of understanding via:  Teach Back   Monitoring/Evaluation:  Dietary intake, exercise, and body weight prn.

## 2015-12-23 NOTE — Assessment & Plan Note (Signed)
Assessment  His chronic pain related to a left arm and leg traumatic injury is stable on the oxycodone 1 tablet by mouth every 6 hours as needed. He is tolerating this medication well.  Plan  We will continue the oxycodone-acetaminophen 10-325 mg 1 tablet via mouth every 6 hours as needed for pain. We continue to slowly wean the dose so that the morphine equivalent is less than 50 mg per day. He was given 3 prescriptions to take to his pharmacy for the next 3 months with one tablet being weaned each month. Ultimately, I would like to see the number of tablets per month be in the 90-100 tablets per month range.

## 2015-12-23 NOTE — Progress Notes (Signed)
   Subjective:    Patient ID: Juan Calderon, male    DOB: 15-Jun-1956, 60 y.o.   MRN: [id number]  HPI  Juan Calderon is here for follow-up of his diabetes, chronic diastolic heart failure, degenerative joint disease of the left knee and cervical spine, hypertension. Please see the A&P for the status of the pt's chronic medical problems.  Review of Systems  Constitutional: Negative for activity change, appetite change and unexpected weight change.  Respiratory: Negative for chest tightness, shortness of breath and wheezing.   Cardiovascular: Positive for leg swelling. Negative for chest pain and palpitations.       Chronic post traumatic swelling on the left lower extremity  Gastrointestinal: Negative for nausea, vomiting, abdominal pain, diarrhea, constipation and abdominal distention.  Musculoskeletal: Positive for arthralgias and neck pain. Negative for myalgias.  Skin: Negative for rash and wound.  Neurological: Positive for headaches. Negative for weakness.       Right posterior neck pain and headaches.      Objective:   Physical Exam  Constitutional: He is oriented to person, place, and time. He appears well-developed and well-nourished.  HENT:  Head: Normocephalic and atraumatic.  Eyes: Conjunctivae are normal. Right eye exhibits no discharge. Left eye exhibits no discharge. No scleral icterus.  Neck: Normal range of motion.  Cardiovascular: Normal rate, regular rhythm and normal heart sounds.  Exam reveals no gallop and no friction rub.   No murmur heard. Pulmonary/Chest: Effort normal and breath sounds normal. No respiratory distress. He has no wheezes. He has no rales.  Abdominal: Soft. He exhibits no distension. There is no tenderness. There is no rebound and no guarding.  Musculoskeletal: Normal range of motion. He exhibits edema.  Neurological: He is oriented to person, place, and time. He exhibits normal muscle tone.  Skin: Skin is warm and dry. No rash noted. No  erythema.  Psychiatric: He has a normal mood and affect. His behavior is normal. Judgment and thought content normal.  Nursing note and vitals reviewed.     Assessment & Plan:   Please see problem oriented charting.

## 2015-12-23 NOTE — Assessment & Plan Note (Signed)
Assessment  He is continuing with the dental assessment and work for an oral appliance as he has not tolerated the CPAP mask for his obstructive sleep apnea. He has yet to receive the final product.  Plan  He was encouraged to complete the process necessary for a fitting for an oral appliance in hopes of treating his obstructive sleep apnea and decreasing the likelihood that he flips back into atrial fibrillation or has an exacerbation of his chronic diastolic heart failure. We will follow-up with the progress on obtaining this oral appliance for his sleep apnea at the follow-up visit.

## 2015-12-23 NOTE — Assessment & Plan Note (Signed)
Assessment  He is currently taking metformin 1000 mg by mouth twice daily. Despite escalating this to the maximal dose his hemoglobin A1c today was 9.0 up from 8.6. Therefore, his diabetes has further deteriorated. He does require additional therapy. Looking at his diabetic meter he has performed 30 readings nearly all of them in the morning. The lowest reading was 69 and was asymptomatic. His average glucose is 133. Therefore his morning sugars are lower than his sugars the rest of the day because his hemoglobin A1c predicts an average glucose of over 200. This suggests he may have postprandial spikes or hyperglycemia during the day. On exam today he was noted to have numbness in the bilateral great toes suggesting further progression of his diabetic associated neuropathy. Fortunately, at this time the neuropathy is nonpainful.  Plan  We will continue the metformin 1000 mg by mouth twice daily and add glipizide 5 mg by mouth daily in hopes of achieving a hemoglobin A1c less than 7.0. This may also assist with the postprandial hyperglycemia if it is occurring. As noted above a diabetic foot exam was completed today. He is otherwise up-to-date on his diabetic health care maintenance. There is no indication to treat his neuropathy at this point other than aggressive risk factor modification. We will follow-up with his diabetic control at the follow-up visit via a hemoglobin A1c.

## 2015-12-23 NOTE — Assessment & Plan Note (Signed)
Assessment  He has had no chest pain consistent with angina requiring subungual nitroglycerin while taking the metoprolol 25 mg by mouth twice daily and atorvastatin 10 mg daily. He is also on valsartan 160 mg by mouth daily.  Plan  We will continue the metoprolol, atorvastatin, and valsartan for his coronary artery disease as it is controlling any angina. We will reassess for angina at the follow-up visit.

## 2015-12-23 NOTE — Assessment & Plan Note (Signed)
Assessment  His blood pressure today is well within target at 119/75. This is on metoprolol 25 mg by mouth twice daily, valsartan 160 mg by mouth daily, and terazosin 5 mg by mouth at bedtime.  PlanWe will continue the metoprolol, valsartan, and terazosin at the current doses and follow-up on the blood pressure at the return visit.

## 2015-12-24 LAB — BMP8+ANION GAP
Anion Gap: 20 mmol/L — ABNORMAL HIGH (ref 10.0–18.0)
BUN / CREAT RATIO: 26 — AB (ref 10–24)
BUN: 29 mg/dL — ABNORMAL HIGH (ref 8–27)
CHLORIDE: 100 mmol/L (ref 96–106)
CO2: 22 mmol/L (ref 18–29)
Calcium: 9.9 mg/dL (ref 8.6–10.2)
Creatinine, Ser: 1.12 mg/dL (ref 0.76–1.27)
GFR calc non Af Amer: 71 mL/min/{1.73_m2} (ref >59)
GFR, EST AFRICAN AMERICAN: 82 mL/min/{1.73_m2} (ref >59)
GLUCOSE: 120 mg/dL — AB (ref 65–99)
Potassium: 4.7 mmol/L (ref 3.5–5.2)
SODIUM: 142 mmol/L (ref 134–144)

## 2015-12-26 NOTE — Progress Notes (Signed)
BMP: K 4.7, BUN 29, Cr 1.12, glucose 120  Will continue current therapy as outlined above.

## 2016-01-04 ENCOUNTER — Ambulatory Visit (INDEPENDENT_AMBULATORY_CARE_PROVIDER_SITE_OTHER): Payer: Medicare Other | Admitting: *Deleted

## 2016-01-04 DIAGNOSIS — Z7901 Long term (current) use of anticoagulants: Secondary | ICD-10-CM

## 2016-01-04 DIAGNOSIS — Z5181 Encounter for therapeutic drug level monitoring: Secondary | ICD-10-CM

## 2016-01-04 DIAGNOSIS — I4891 Unspecified atrial fibrillation: Secondary | ICD-10-CM

## 2016-01-04 LAB — POCT INR: INR: 1.8

## 2016-01-09 DIAGNOSIS — M1712 Unilateral primary osteoarthritis, left knee: Secondary | ICD-10-CM | POA: Diagnosis not present

## 2016-01-09 DIAGNOSIS — M25562 Pain in left knee: Secondary | ICD-10-CM | POA: Diagnosis not present

## 2016-01-12 ENCOUNTER — Other Ambulatory Visit: Payer: Self-pay | Admitting: *Deleted

## 2016-01-12 DIAGNOSIS — M25562 Pain in left knee: Secondary | ICD-10-CM | POA: Diagnosis not present

## 2016-01-12 DIAGNOSIS — M5412 Radiculopathy, cervical region: Secondary | ICD-10-CM

## 2016-01-18 ENCOUNTER — Ambulatory Visit (INDEPENDENT_AMBULATORY_CARE_PROVIDER_SITE_OTHER): Payer: Medicare Other | Admitting: *Deleted

## 2016-01-18 DIAGNOSIS — Z7901 Long term (current) use of anticoagulants: Secondary | ICD-10-CM | POA: Diagnosis not present

## 2016-01-18 DIAGNOSIS — Z5181 Encounter for therapeutic drug level monitoring: Secondary | ICD-10-CM

## 2016-01-18 DIAGNOSIS — I4891 Unspecified atrial fibrillation: Secondary | ICD-10-CM | POA: Diagnosis not present

## 2016-01-18 LAB — POCT INR: INR: 2.1

## 2016-01-24 ENCOUNTER — Encounter: Payer: Self-pay | Admitting: Internal Medicine

## 2016-01-24 ENCOUNTER — Ambulatory Visit (INDEPENDENT_AMBULATORY_CARE_PROVIDER_SITE_OTHER): Payer: Medicare Other | Admitting: Neurology

## 2016-01-24 DIAGNOSIS — M5412 Radiculopathy, cervical region: Secondary | ICD-10-CM | POA: Diagnosis not present

## 2016-01-24 DIAGNOSIS — G5601 Carpal tunnel syndrome, right upper limb: Secondary | ICD-10-CM

## 2016-01-24 HISTORY — DX: Radiculopathy, cervical region: M54.12

## 2016-01-24 HISTORY — DX: Carpal tunnel syndrome, right upper limb: G56.01

## 2016-01-24 NOTE — Procedures (Signed)
[facility]  [address], [address]  [place], [place] [address] Tel: [phone] Fax:  [phone] Test Date:  01/24/2016  Patient: Juan Calderon DOB: 1956/02/09 Physician: Narda Amber, DO  Sex: Male Height: 6' " Ref Phys: Dr Eppie Gibson  ID#: [id number] Temp: 33.2C Technician: Jerilynn Mages. Dean   Patient Complaints: This is a 60 year old gentleman referred for evaluation of right hand paresthesias.  NCV & EMG Findings: Extensive electrodiagnostic testing of the right upper extremity shows: 1. Right median sensory response is mildly prolonged and has normal amplitude. Right ulnar sensory response is within normal limits. 2. Right median and ulnar motor responses are within normal limits. 3. Chronic motor axon loss changes are seen affecting the C6 myotomes on the right, without accompanied active denervation. Comparison study of the left upper extremity was declined by patient.  Impression: 1. Right median neuropathy at or distal to the wrist, consistent with clinical diagnosis of carpal tunnel syndrome; mild in degree electrically. 2. Chronic C6 radiculopathy affecting the right upper extremity, mild to moderate in degree electrically.   ___________________________ Narda Amber, DO    Nerve Conduction Studies Anti Sensory Summary Table   Stim Site NR Peak (ms) Norm Peak (ms) P-T Amp (V) Norm P-T Amp  Right Median Anti Sensory (2nd Digit)  Wrist    3.9 <3.8 25.3 >10  Right Ulnar Anti Sensory (5th Digit)  Wrist    3.2 <3.2 18.9 >5   Motor Summary Table   Stim Site NR Onset (ms) Norm Onset (ms) O-P Amp (mV) Norm O-P Amp Site1 Site2 Delta-0 (ms) Dist (cm) Vel (m/s) Norm Vel (m/s)  Right Median Motor (Abd Poll Brev)  Wrist    3.7 <4.0 8.9 >5 Elbow Wrist 4.7 26.0 55 >50  Elbow    8.4  7.8         Right Ulnar Motor (Abd Dig Minimi)  Wrist    3.0 <3.1 7.7 >7 B Elbow Wrist 5.0 27.0 54 >50  B Elbow    8.0  6.5  A Elbow B Elbow 1.5 10.0 67 >50  A Elbow    9.5  5.9           EMG   Side Muscle Ins Act Fibs Psw Fasc Number Recrt Dur Dur. Amp Amp. Poly Poly. Comment  Right 1stDorInt Nml Nml Nml Nml Nml Nml Nml Nml Nml Nml Nml Nml N/A  Right Abd Poll Brev Nml Nml Nml Nml Nml Nml Nml Nml Nml Nml Nml Nml N/A  Right Ext Indicis Nml Nml Nml Nml Nml Nml Nml Nml Nml Nml Nml Nml N/A  Right PronatorTeres Nml Nml Nml Nml 1- Rapid Some 1+ Some 1+ Nml Nml N/A  Right Biceps Nml Nml Nml Nml 1- Rapid Some 1+ Some 1+ Nml Nml N/A  Right Triceps Nml Nml Nml Nml Nml Nml Nml Nml Nml Nml Nml Nml N/A  Right Deltoid Nml Nml Nml Nml 1- Mod-R Some 1+ Some 1+ Nml Nml N/A      Waveforms:

## 2016-02-08 ENCOUNTER — Ambulatory Visit (INDEPENDENT_AMBULATORY_CARE_PROVIDER_SITE_OTHER): Payer: Medicare Other | Admitting: *Deleted

## 2016-02-08 DIAGNOSIS — I4891 Unspecified atrial fibrillation: Secondary | ICD-10-CM

## 2016-02-08 DIAGNOSIS — Z5181 Encounter for therapeutic drug level monitoring: Secondary | ICD-10-CM | POA: Diagnosis not present

## 2016-02-08 DIAGNOSIS — Z7901 Long term (current) use of anticoagulants: Secondary | ICD-10-CM

## 2016-02-08 LAB — POCT INR: INR: 1.4

## 2016-02-14 DIAGNOSIS — H40013 Open angle with borderline findings, low risk, bilateral: Secondary | ICD-10-CM | POA: Diagnosis not present

## 2016-02-14 LAB — HM DIABETES EYE EXAM

## 2016-02-15 ENCOUNTER — Ambulatory Visit (INDEPENDENT_AMBULATORY_CARE_PROVIDER_SITE_OTHER): Payer: Medicare Other | Admitting: *Deleted

## 2016-02-15 DIAGNOSIS — Z5181 Encounter for therapeutic drug level monitoring: Secondary | ICD-10-CM | POA: Diagnosis not present

## 2016-02-15 DIAGNOSIS — I4891 Unspecified atrial fibrillation: Secondary | ICD-10-CM

## 2016-02-15 DIAGNOSIS — Z7901 Long term (current) use of anticoagulants: Secondary | ICD-10-CM | POA: Diagnosis not present

## 2016-02-15 LAB — POCT INR: INR: 2.9

## 2016-02-28 ENCOUNTER — Telehealth: Payer: Self-pay

## 2016-02-28 ENCOUNTER — Emergency Department (HOSPITAL_COMMUNITY)
Admission: EM | Admit: 2016-02-28 | Discharge: 2016-02-28 | Disposition: A | Discharge status: Home / Self Care | Payer: Medicare Other | Attending: Emergency Medicine | Admitting: Emergency Medicine

## 2016-02-28 ENCOUNTER — Encounter (HOSPITAL_COMMUNITY): Payer: Self-pay | Admitting: Emergency Medicine

## 2016-02-28 ENCOUNTER — Emergency Department (HOSPITAL_COMMUNITY): Payer: Medicare Other

## 2016-02-28 DIAGNOSIS — Z7901 Long term (current) use of anticoagulants: Secondary | ICD-10-CM | POA: Diagnosis not present

## 2016-02-28 DIAGNOSIS — E1140 Type 2 diabetes mellitus with diabetic neuropathy, unspecified: Secondary | ICD-10-CM | POA: Diagnosis not present

## 2016-02-28 DIAGNOSIS — I5032 Chronic diastolic (congestive) heart failure: Secondary | ICD-10-CM | POA: Diagnosis not present

## 2016-02-28 DIAGNOSIS — I110 Hypertensive heart disease with heart failure: Secondary | ICD-10-CM | POA: Diagnosis not present

## 2016-02-28 DIAGNOSIS — M25562 Pain in left knee: Secondary | ICD-10-CM | POA: Insufficient documentation

## 2016-02-28 DIAGNOSIS — J449 Chronic obstructive pulmonary disease, unspecified: Secondary | ICD-10-CM | POA: Diagnosis not present

## 2016-02-28 DIAGNOSIS — Z7984 Long term (current) use of oral hypoglycemic drugs: Secondary | ICD-10-CM | POA: Diagnosis not present

## 2016-02-28 DIAGNOSIS — I2510 Atherosclerotic heart disease of native coronary artery without angina pectoris: Secondary | ICD-10-CM | POA: Insufficient documentation

## 2016-02-28 DIAGNOSIS — Z79899 Other long term (current) drug therapy: Secondary | ICD-10-CM | POA: Diagnosis not present

## 2016-02-28 MED ORDER — HYDROMORPHONE HCL 1 MG/ML IJ SOLN
1.0000 mg | Freq: Once | INTRAMUSCULAR | Status: AC
  Administered 2016-02-28: 1 mg via INTRAMUSCULAR
  Filled 2016-02-28: qty 1

## 2016-02-28 NOTE — Discharge Instructions (Signed)
Continue your home percocet.   Follow-up with Dr. Mardelle Matte-- call his office to make appt. Return here for new concerns.

## 2016-02-28 NOTE — ED Notes (Signed)
Pt. reports worsening left knee pain /arthritis onset 2 days ago unrelieved by prescription Percocet , denies injury or fall . No fever or chills.

## 2016-02-28 NOTE — Telephone Encounter (Signed)
Pt requesting the nurse to call back regarding pain med.

## 2016-02-28 NOTE — ED Provider Notes (Signed)
CSN: [id number]     Arrival date & time 02/28/16  0416 History   First MD Initiated Contact with Patient 02/28/16 [phone]     Chief Complaint  Patient presents with  . Knee Pain     (Consider location/radiation/quality/duration/timing/severity/associated sxs/prior Treatment) Patient is a 60 y.o. male presenting with knee pain. The history is provided by the patient and medical records.  Knee Pain  60 year old male with history of hypertension, heart failure, chronic pain, diabetes, coronary artery disease, hyperlipidemia, CKD, paroxysmal AFIB, osteoarthritis of left knee with hx of osteomyelitis, history of traumatic injury to left knee with reconstruction, presenting to the ED for left knee pain. He states he has a long-standing history of chronic left knee pain as well as left lower leg pain due to his prior injuries and surgeries. He states over the past 2 days this is been somewhat worse than usual. He denies any new injury, trauma, or fall. States pain is worse with weight bearing and ambulation.  He denies any fever or chills. He is followed by orthopedics, Dr. Mardelle Matte but has not seen him in over 2 months. He states he was told to follow-up as needed. He does take home Percocet but states this is not controlling his pain currently.  Past Medical History  Diagnosis Date  . Essential hypertension    . Persistent atrial fibrillation ([place])    . Chronic diastolic heart failure ([place])      with mild left ventricular hypertrophy on Echo 02/2010  . Chronic pain syndrome      Left arm and leg s/p traumatic injury   . Osteoarthritis cervical spine    . Gastroesophageal reflux disease    . Open-angle glaucoma    . 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 ([facility])    . Hyperlipidemia LDL goal < 100    . Coronary artery disease      25% LAD stenosis on cath 2007.  Stable angina.  . Gout    . Right rotator cuff tear      Large full-thickness tear of the supraspinatus  with mild retraction but no atrophy   . Obstructive sleep apnea      Moderate, AHI 29.8 per hour with moderately loud snoring and oxygen desaturation to a nadir of 79%. CPAP titration resulted in a prescription for 17 CWP.    [name] Cataract     Left eye  . Morbid obesity with BMI of 40.0-44.9, adult ([place])    . Chronic obstructive pulmonary disease ([place])    . Diverticulosis    . Osteoarthritis of left knee 06/19/2013    Tricompartmental disease.  Treated with double hinged upright knee brace, steroid/xylocaine knee injections, and NSAIDs   . Chronic renal insufficiency    . Alcohol abuse    . Subclinical hypothyroidism    . Normocytic anemia    . Paroxysmal atrial fibrillation ([place]) 10/25/2015  . Mild carpal tunnel syndrome of right wrist 01/24/2016    Mild degree electrically per EMG 01/24/2016   . C6 radiculopathy 01/24/2016    Right upper extremity, mild to moderate electrically by EMG on 01/24/2016   Past Surgical History  Procedure Laterality Date  . Left leg surgery    . Left arm surgery    . Shoulder surgery      Right  . Fracture surgery Left 1980's    Elbow  . Cardioversion N/A 12/30/2014    Procedure: CARDIOVERSION;  Surgeon: [name], MD;  Location: [facility] ENDOSCOPY;  Service: Cardiovascular;  Laterality: N/A;   Family History  Problem Relation Age of Onset  . Heart failure Mother   . Alzheimer's disease Father   . Heart failure Brother   . Osteoarthritis Brother   . Prostate cancer Brother   . Early death Brother     Manufacturing systems engineer  . Heart attack Neg Hx   . Stroke Neg Hx   . Hypertension Sister   . Hypertension Brother    Social History  Substance Use Topics  . Smoking status: Never Smoker   . Smokeless tobacco: Never Used  . Alcohol Use: Yes    Review of Systems  Musculoskeletal: Positive for arthralgias.  All other systems reviewed and are negative.     Allergies  Ramipril and Testosterone  Home Medications   Prior to Admission medications    Medication Sig Start Date End Date Taking? Authorizing Provider  allopurinol (ZYLOPRIM) 300 MG tablet Take 1 tablet (300 mg total) by mouth daily. 05/11/15   Oval Linsey, MD  atorvastatin (LIPITOR) 10 MG tablet Take 1 tablet (10 mg total) by mouth daily. 06/10/15   Oval Linsey, MD  Blood Glucose Monitoring Suppl (ONETOUCH VERIO) w/Device KIT 1 each by Does not apply route daily. 11/04/15   Loleta Chance, MD  esomeprazole (NEXIUM) 40 MG capsule Take 1 capsule (40 mg total) by mouth daily. 05/11/15   Oval Linsey, MD  flecainide (TAMBOCOR) 100 MG tablet Take 1 tablet (100 mg total) by mouth every 12 (twelve) hours. 12/23/15   Oval Linsey, MD  glipiZIDE (GLUCOTROL) 5 MG tablet Take 1 tablet (5 mg total) by mouth daily before breakfast. 12/23/15   Oval Linsey, MD  glucose blood [facility] VERIO) test strip Use as instructed 11/04/15   Loleta Chance, MD  latanoprost (XALATAN) 0.005 % ophthalmic solution Place 1 drop into both eyes at bedtime. 01/27/15   Oval Linsey, MD  magnesium oxide (MAG-OX) 400 MG tablet Take 1 tablet (400 mg total) by mouth daily. 12/23/15   Oval Linsey, MD  metFORMIN (GLUCOPHAGE) 1000 MG tablet Take 1 tablet (1,000 mg total) by mouth 2 (two) times daily with a meal. 12/23/15   Oval Linsey, MD  metoprolol tartrate (LOPRESSOR) 25 MG tablet Take 1 tablet (25 mg total) by mouth 2 (two) times daily. 04/25/15   Oval Linsey, MD  nitroGLYCERIN (NITROSTAT) 0.4 MG SL tablet Place 1 tablet (0.4 mg total) under the tongue every 5 (five) minutes as needed for chest pain. 06/10/15   Oval Linsey, MD  [facility] DELICA LANCETS 88F MISC Use 1 strip daily 11/04/15   Loleta Chance, MD  oxyCODONE-acetaminophen (PERCOCET) 10-325 MG tablet Take 1 tablet by mouth every 6 (six) hours as needed for pain. 12/23/15   Oval Linsey, MD  sorbitol 70 % solution Take 15-60 mLs by mouth daily as needed (mix in coffee, juice, or water). 12/24/14   Oval Linsey, MD  terazosin (HYTRIN) 5 MG capsule Take 1  capsule (5 mg total) by mouth at bedtime. 06/24/15   Oval Linsey, MD  torsemide (DEMADEX) 20 MG tablet Take 2 tablets three times weekly 12/23/15   Oval Linsey, MD  valsartan (DIOVAN) 160 MG tablet Take 1 tablet (160 mg total) by mouth daily. 09/09/15   Oval Linsey, MD  warfarin (COUMADIN) 5 MG tablet Take as directed by [facility]. 12/15/15   Burnell Blanks, MD   BP 125/67 mmHg  Pulse 70  Temp(Src) 98.3 F (36.8 C) (Oral)  Resp 16  SpO2 98%   Physical  Exam  Constitutional: He is oriented to person, place, and time. He appears well-developed and well-nourished.  HENT:  Head: Normocephalic and atraumatic.  Mouth/Throat: Oropharynx is clear and moist.  Eyes: Conjunctivae and EOM are normal. Pupils are equal, round, and reactive to light.  Neck: Normal range of motion.  Cardiovascular: Normal rate, regular rhythm and normal heart sounds.   Pulmonary/Chest: Effort normal and breath sounds normal. No respiratory distress. He has no wheezes.  Abdominal: Soft. Bowel sounds are normal.  Musculoskeletal: Normal range of motion.  Left knee and lower leg with multiple well-healed surgical scars and chronic deformity  No significant swelling, erythema, or warmth to touch of the left knee; no appreciable effusion; full flexion/extension maintained; DP pulse intact left foot; normal sensation throughout  No calf tenderness or swelling, no palpable cords  Neurological: He is alert and oriented to person, place, and time.  Skin: Skin is warm and dry.  Psychiatric: He has a normal mood and affect.  Nursing note and vitals reviewed.   ED Course  Procedures (including critical care time) Labs Review Labs Reviewed - No data to display  Imaging Review Dg Knee Complete 4 Views Left  02/28/2016  CLINICAL DATA:  Worsening left knee pain over the past 2 days. EXAM: LEFT KNEE - COMPLETE 4+ VIEW COMPARISON:  MRI 06/07/2015 FINDINGS: Negative for acute fracture dislocation. There is  chronic sclerosis and periosteal thickening of the femoral diaphysis, extending beyond the upper border of this study. This is unchanged and consistent with chronic osteomyelitis. No acute soft tissue abnormality is evident. IMPRESSION: No acute findings. Sclerosis and periosteal thickening of the femoral diaphysis consistent with chronic osteomyelitis, unchanged. Electronically Signed   By: Andreas Newport M.D.   On: 02/28/2016 05:48   I have personally reviewed and evaluated these images and lab results as part of my medical decision-making.   EKG Interpretation None      MDM   Final diagnoses:  Left knee pain   60 year old male here with left knee pain. This appears to be acute on chronic issue. No new injury, trauma, or falls. Patient is afebrile, nontoxic. He has chronic postoperative changes and deformity of the left knee as well as left lower leg. There is no appreciable joint effusion, erythema, or warmth to touch of the left knee. He has no calf swelling or tenderness. No palpable cords. X-ray obtained, findings consistent with chronic osteomyelitis without acute changes.  Do not suspect septic joint or acute DVT at this time. Patient is already on home Percocet which has not been helping his symptoms. He was given IM injection of dilaudid with improvement.  Feel he is stable for discharge.  He has not seen his orthopedist, Dr. Mardelle Matte, in over 2 months-- recommended that he follow-up with him in clinic.  Discussed plan with patient, he/she acknowledged understanding and agreed with plan of care.  Return precautions given for new or worsening symptoms.  Larene Pickett, PA-C 02/28/16 [place], DO 02/29/16 0010

## 2016-02-29 ENCOUNTER — Emergency Department (HOSPITAL_COMMUNITY)
Admission: EM | Admit: 2016-02-29 | Discharge: 2016-02-29 | Disposition: A | Discharge status: Home / Self Care | Payer: Medicare Other | Attending: Emergency Medicine | Admitting: Emergency Medicine

## 2016-02-29 ENCOUNTER — Encounter (HOSPITAL_COMMUNITY): Payer: Self-pay | Admitting: Adult Health

## 2016-02-29 DIAGNOSIS — I5032 Chronic diastolic (congestive) heart failure: Secondary | ICD-10-CM | POA: Insufficient documentation

## 2016-02-29 DIAGNOSIS — M170 Bilateral primary osteoarthritis of knee: Secondary | ICD-10-CM | POA: Diagnosis not present

## 2016-02-29 DIAGNOSIS — M79605 Pain in left leg: Secondary | ICD-10-CM

## 2016-02-29 DIAGNOSIS — N189 Chronic kidney disease, unspecified: Secondary | ICD-10-CM | POA: Diagnosis not present

## 2016-02-29 DIAGNOSIS — Z7901 Long term (current) use of anticoagulants: Secondary | ICD-10-CM | POA: Diagnosis not present

## 2016-02-29 DIAGNOSIS — I130 Hypertensive heart and chronic kidney disease with heart failure and stage 1 through stage 4 chronic kidney disease, or unspecified chronic kidney disease: Secondary | ICD-10-CM | POA: Diagnosis not present

## 2016-02-29 DIAGNOSIS — Z7984 Long term (current) use of oral hypoglycemic drugs: Secondary | ICD-10-CM | POA: Diagnosis not present

## 2016-02-29 DIAGNOSIS — J449 Chronic obstructive pulmonary disease, unspecified: Secondary | ICD-10-CM | POA: Diagnosis not present

## 2016-02-29 DIAGNOSIS — I2510 Atherosclerotic heart disease of native coronary artery without angina pectoris: Secondary | ICD-10-CM | POA: Insufficient documentation

## 2016-02-29 DIAGNOSIS — R0902 Hypoxemia: Secondary | ICD-10-CM | POA: Diagnosis not present

## 2016-02-29 DIAGNOSIS — R600 Localized edema: Secondary | ICD-10-CM | POA: Insufficient documentation

## 2016-02-29 DIAGNOSIS — E1140 Type 2 diabetes mellitus with diabetic neuropathy, unspecified: Secondary | ICD-10-CM | POA: Diagnosis not present

## 2016-02-29 MED ORDER — ONDANSETRON 4 MG PO TBDP
8.0000 mg | ORAL_TABLET | Freq: Once | ORAL | Status: AC
  Administered 2016-02-29: 8 mg via ORAL
  Filled 2016-02-29: qty 2

## 2016-02-29 MED ORDER — HYDROMORPHONE HCL 2 MG PO TABS: 2.0000 mg | ORAL_TABLET | ORAL | Status: DC | PRN

## 2016-02-29 MED ORDER — HYDROMORPHONE HCL 1 MG/ML IJ SOLN
2.0000 mg | Freq: Once | INTRAMUSCULAR | Status: AC
  Administered 2016-02-29: 2 mg via INTRAMUSCULAR
  Filled 2016-02-29: qty 2

## 2016-02-29 NOTE — Telephone Encounter (Signed)
Lm for rtc 

## 2016-02-29 NOTE — Discharge Instructions (Signed)
Please contact your primary care provider and your orthopedic doctor so that appropriate arrangements can be made for ongoing pain management.  Hydromorphone tablets What is this medicine? HYDROMORPHONE (hye droe MOR fone) is a pain reliever. It is used to treat moderate to severe pain. This medicine may be used for other purposes; ask your health care provider or pharmacist if you have questions. What should I tell my health care provider before I take this medicine? They need to know if you have any of these conditions: -brain tumor -drug abuse or addiction -head injury -heart disease -frequently drink alcohol containing drinks -kidney disease or problems going to the bathroom -liver disease -lung disease, asthma, or breathing problems -mental problems -an allergic or unusual reaction to lactose, hydromorphone, other opioid analgesics, other medicines, sulfites, foods, dyes, or preservatives -pregnant or trying to get pregnant -breast-feeding How should I use this medicine? Take this medicine by mouth with a glass of water. If the medicine upsets your stomach, take it with food or milk. Follow the directions on the prescription label. Do not take more medicine than you are told to take. Talk to your pediatrician regarding the use of this medicine in children. Special care may be needed. Overdosage: If you think you have taken too much of this medicine contact a poison control center or emergency room at once. NOTE: This medicine is only for you. Do not share this medicine with others. What if I miss a dose? If you miss a dose, take it as soon as you can. If it is almost time for your next dose, take only that dose. Do not take double or extra doses. What may interact with this medicine? -alcohol -antihistamines for allergy, cough and cold -medicines for anesthesia -medicines for depression, anxiety, or psychotic disturbances -medicines for sleep -muscle  relaxants -naltrexone -narcotic medicines (opiates) for pain -phenothiazines like chlorpromazine, mesoridazine, prochlorperazine, thioridazine -tramadol This list may not describe all possible interactions. Give your health care provider a list of all the medicines, herbs, non-prescription drugs, or dietary supplements you use. Also tell them if you smoke, drink alcohol, or use illegal drugs. Some items may interact with your medicine. What should I watch for while using this medicine? Tell your doctor or health care professional if your pain does not go away, if it gets worse, or if you have new or a different type of pain. You may develop tolerance to the medicine. Tolerance means that you will need a higher dose of the medicine for pain relief. Tolerance is normal and is expected if you take this medicine for a long time. Do not suddenly stop taking your medicine because you may develop a severe reaction. Your body becomes used to the medicine. This does NOT mean you are addicted. Addiction is a behavior related to getting and using a drug for a non-medical reason. If you have pain, you have a medical reason to take pain medicine. Your doctor will tell you how much medicine to take. If your doctor wants you to stop the medicine, the dose will be slowly lowered over time to avoid any side effects. You may get drowsy or dizzy. Do not drive, use machinery, or do anything that needs mental alertness until you know how this medicine affects you. Do not stand or sit up quickly, especially if you are an older patient. This reduces the risk of dizzy or fainting spells. Alcohol may interfere with the effect of this medicine. Avoid alcoholic drinks. There are different types  of narcotic medicines (opiates) for pain. If you take more than one type at the same time, you may have more side effects. Give your health care provider a list of all medicines you use. Your doctor will tell you how much medicine to take. Do  not take more medicine than directed. Call emergency for help if you have problems breathing. This medicine will cause constipation. Try to have a bowel movement at least every 2 to 3 days. If you do not have a bowel movement for 3 days, call your doctor or health care professional. Your mouth may get dry. Chewing sugarless gum or sucking hard candy, and drinking plenty of water may help. Contact your doctor if the problem does not go away or is severe. What side effects may I notice from receiving this medicine? Side effects that you should report to your doctor or health care professional as soon as possible: -allergic reactions like skin rash, itching or hives, swelling of the face, lips, or tongue -breathing problems -changes in vision -confusion -feeling faint or lightheaded, falls -seizures -slow or fast heartbeat -trouble passing urine or change in the amount of urine -trouble with balance, talking, walking -unusually weak or tired Side effects that usually do not require medical attention (report to your doctor or health care professional if they continue or are bothersome): -difficulty sleeping -drowsiness -dry mouth -flushing -headache -itching -loss of appetite -nausea, vomiting This list may not describe all possible side effects. Call your doctor for medical advice about side effects. You may report side effects to FDA at 1-800-FDA-1088. Where should I keep my medicine? Keep out of the reach of children. This medicine can be abused. Keep your medicine in a safe place to protect it from theft. Do not share this medicine with anyone. Selling or giving away this medicine is dangerous and against the law. Store at room temperature between 15 and 30 degrees C (59 and 86 degrees F). Keep container tightly closed. Protect from light. This medicine may cause accidental overdose and death if it is taken by other adults, children, or pets. Flush any unused medicine down the toilet to  reduce the chance of harm. Do not use the medicine after the expiration date. NOTE: This sheet is a summary. It may not cover all possible information. If you have questions about this medicine, talk to your doctor, pharmacist, or health care provider.    2016, Elsevier/Gold Standard. (2013-03-24 09:50:15)

## 2016-02-29 NOTE — ED Notes (Addendum)
Presents with left upper leg pain, it has been ongoing for some time. He was here yesterday and given a shot of dilaudid. Sent home with percocets which are not managing his pain at home. Requesting shot of pain medicationa nd management of pain.  Last percocet 11:50pm

## 2016-02-29 NOTE — Telephone Encounter (Signed)
Called pt again, he answered states he has gone to ED twice for leg pain and needs" strong pain killers", also states he has seen the ortho doctor and got some shots in his leg, he is offered an appt, refuses and states he will call the first week of July and make an appt but he doesn't want one now

## 2016-02-29 NOTE — ED Provider Notes (Signed)
CSN: [id number]     Arrival date & time 02/29/16  0131 History  By signing my name below, I, Randa Evens, attest that this documentation has been prepared under the direction and in the presence of Delora Fuel, MD. Electronically Signed: Randa Evens, ED Scribe. 02/29/2016. 4:02 AM.    Chief Complaint  Patient presents with  . Leg Pain   Patient is a 60 y.o. male presenting with leg pain. The history is provided by the patient. No language interpreter was used.  Leg Pain  HPI Comments: Juan Calderon is a 60 y.o. male who presents to the Emergency Department complaining of worsening right upper leg pain onset 2 days prior. Pt states that the pain begins around his knee and radiates up toward his groin. Pt reports Hx of bone infection in the left femur. Pt reports Hx of diabetes mellitus as well. Pt states that he has tried percocet with no relief. Pt reports that he was seen in the ED 1 day prior and had relief with dilaudid injection. Pt doesn't report numbness.   Past Medical History  Diagnosis Date  . Essential hypertension    . Persistent atrial fibrillation ([place])    . Chronic diastolic heart failure ([place])      with mild left ventricular hypertrophy on Echo 02/2010  . Chronic pain syndrome      Left arm and leg s/p traumatic injury   . Osteoarthritis cervical spine    . Gastroesophageal reflux disease    . Open-angle glaucoma    . 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 (HCC)    . Hyperlipidemia LDL goal < 100    . Coronary artery disease      25% LAD stenosis on cath 2007.  Stable angina.  . Gout    . Right rotator cuff tear      Large full-thickness tear of the supraspinatus with mild retraction but no atrophy   . Obstructive sleep apnea      Moderate, AHI 29.8 per hour with moderately loud snoring and oxygen desaturation to a nadir of 79%. CPAP titration resulted in a prescription for 17 CWP.    [name] Cataract     Left eye  . Morbid obesity  with BMI of 40.0-44.9, adult ([place])    . Chronic obstructive pulmonary disease ([place])    . Diverticulosis    . Osteoarthritis of left knee 06/19/2013    Tricompartmental disease.  Treated with double hinged upright knee brace, steroid/xylocaine knee injections, and NSAIDs   . Chronic renal insufficiency    . Alcohol abuse    . Subclinical hypothyroidism    . Normocytic anemia    . Paroxysmal atrial fibrillation ([place]) 10/25/2015  . Mild carpal tunnel syndrome of right wrist 01/24/2016    Mild degree electrically per EMG 01/24/2016   . C6 radiculopathy 01/24/2016    Right upper extremity, mild to moderate electrically by EMG on 01/24/2016   Past Surgical History  Procedure Laterality Date  . Left leg surgery    . Left arm surgery    . Shoulder surgery      Right  . Fracture surgery Left 1980's    Elbow  . Cardioversion N/A 12/30/2014    Procedure: CARDIOVERSION;  Surgeon: [name], MD;  Location: [facility] ENDOSCOPY;  Service: Cardiovascular;  Laterality: N/A;   Family History  Problem Relation Age of Onset  . Heart failure Mother   . Alzheimer's disease Father   . Heart failure  Brother   . Osteoarthritis Brother   . Prostate cancer Brother   . Early death Brother     Manufacturing systems engineer  . Heart attack Neg Hx   . Stroke Neg Hx   . Hypertension Sister   . Hypertension Brother    Social History  Substance Use Topics  . Smoking status: Never Smoker   . Smokeless tobacco: Never Used  . Alcohol Use: Yes    Review of Systems  Musculoskeletal: Positive for arthralgias.  Neurological: Negative for numbness.  All other systems reviewed and are negative.    Allergies  Ramipril and Testosterone  Home Medications   Prior to Admission medications   Medication Sig Start Date End Date Taking? Authorizing Provider  allopurinol (ZYLOPRIM) 300 MG tablet Take 1 tablet (300 mg total) by mouth daily. 05/11/15   Oval Linsey, MD  atorvastatin (LIPITOR) 10 MG tablet Take 1 tablet (10 mg  total) by mouth daily. 06/10/15   Oval Linsey, MD  Blood Glucose Monitoring Suppl (ONETOUCH VERIO) w/Device KIT 1 each by Does not apply route daily. 11/04/15   Loleta Chance, MD  esomeprazole (NEXIUM) 40 MG capsule Take 1 capsule (40 mg total) by mouth daily. 05/11/15   Oval Linsey, MD  flecainide (TAMBOCOR) 100 MG tablet Take 1 tablet (100 mg total) by mouth every 12 (twelve) hours. 12/23/15   Oval Linsey, MD  glipiZIDE (GLUCOTROL) 5 MG tablet Take 1 tablet (5 mg total) by mouth daily before breakfast. 12/23/15   Oval Linsey, MD  glucose blood [facility] VERIO) test strip Use as instructed 11/04/15   Loleta Chance, MD  latanoprost (XALATAN) 0.005 % ophthalmic solution Place 1 drop into both eyes at bedtime. 01/27/15   Oval Linsey, MD  magnesium oxide (MAG-OX) 400 MG tablet Take 1 tablet (400 mg total) by mouth daily. 12/23/15   Oval Linsey, MD  metFORMIN (GLUCOPHAGE) 1000 MG tablet Take 1 tablet (1,000 mg total) by mouth 2 (two) times daily with a meal. 12/23/15   Oval Linsey, MD  metoprolol tartrate (LOPRESSOR) 25 MG tablet Take 1 tablet (25 mg total) by mouth 2 (two) times daily. 04/25/15   Oval Linsey, MD  nitroGLYCERIN (NITROSTAT) 0.4 MG SL tablet Place 1 tablet (0.4 mg total) under the tongue every 5 (five) minutes as needed for chest pain. 06/10/15   Oval Linsey, MD  [facility] DELICA LANCETS 70Y MISC Use 1 strip daily 11/04/15   Loleta Chance, MD  oxyCODONE-acetaminophen (PERCOCET) 10-325 MG tablet Take 1 tablet by mouth every 6 (six) hours as needed for pain. 12/23/15   Oval Linsey, MD  sorbitol 70 % solution Take 15-60 mLs by mouth daily as needed (mix in coffee, juice, or water). 12/24/14   Oval Linsey, MD  terazosin (HYTRIN) 5 MG capsule Take 1 capsule (5 mg total) by mouth at bedtime. 06/24/15   Oval Linsey, MD  torsemide (DEMADEX) 20 MG tablet Take 2 tablets three times weekly 12/23/15   Oval Linsey, MD  valsartan (DIOVAN) 160 MG tablet Take 1 tablet (160 mg total) by mouth  daily. 09/09/15   Oval Linsey, MD  warfarin (COUMADIN) 5 MG tablet Take as directed by [facility]. Patient taking differently: Take 2.5-5 mg by mouth daily. 42m everyday except 2.514mon Wednesdays. 12/15/15   ChBurnell BlanksMD   BP 126/73 mmHg  Pulse 66  Temp(Src) 97.8 F (36.6 C) (Oral)  Resp 20  Ht 6' (1.829 m)  Wt 285 lb (129.275 kg)  BMI 38.64 kg/m2  SpO2 97%  Physical Exam  Constitutional: He is oriented to person, place, and time. He appears well-developed and well-nourished. No distress.  HENT:  Head: Normocephalic and atraumatic.  Eyes: Conjunctivae and EOM are normal. Pupils are equal, round, and reactive to light.  Neck: Normal range of motion. Neck supple. No JVD present.  Cardiovascular: Normal rate, regular rhythm and normal heart sounds.   No murmur heard. Pulmonary/Chest: Breath sounds normal. He has no wheezes. He has no rales. He exhibits no tenderness.  Abdominal: Soft. Bowel sounds are normal. He exhibits no distension and no mass. There is no tenderness.  Musculoskeletal: Normal range of motion. He exhibits edema.  surgical scars in the left lower leg. 1+ pitting edema bilaterally. Cap refill prompt.   Lymphadenopathy:    He has no cervical adenopathy.  Neurological: He is alert and oriented to person, place, and time. No cranial nerve deficit. He exhibits normal muscle tone. Coordination normal.  Skin: Skin is warm and dry. No rash noted.  Psychiatric: He has a normal mood and affect. His behavior is normal. Judgment and thought content normal.  Nursing note and vitals reviewed.   ED Course  Procedures (including critical care time) DIAGNOSTIC STUDIES: Oxygen Saturation is 97% on RA, normal by my interpretation.    COORDINATION OF CARE: 4:01 AM-Discussed treatment plan which includes Dg knee with pt at bedside and pt agreed to plan.    MDM   Final diagnoses:  Pain In Left Leg      Left leg pain which is not responding to  oxycodone-acetaminophen. Old records reviewed and he was seen in the ED yesterday and given an injection of hydromorphone which did give him temporary relief. He is given an injection of hydromorphone here, which, again gave him some relief. He sent home with prescription for hydromorphone. He is to contact his PCP and his orthopedic surgeon to make appropriate arrangements for ongoing pain management.   I personally performed the services described in this documentation, which was scribed in my presence. The recorded information has been reviewed and is accurate.        Delora Fuel, MD [id number]

## 2016-02-29 NOTE — Telephone Encounter (Signed)
Rtc, lm for rtc 

## 2016-02-29 NOTE — ED Notes (Signed)
Pt using staff's walker

## 2016-03-01 ENCOUNTER — Ambulatory Visit (INDEPENDENT_AMBULATORY_CARE_PROVIDER_SITE_OTHER): Payer: Medicare Other | Admitting: *Deleted

## 2016-03-01 DIAGNOSIS — I4891 Unspecified atrial fibrillation: Secondary | ICD-10-CM

## 2016-03-01 DIAGNOSIS — Z7901 Long term (current) use of anticoagulants: Secondary | ICD-10-CM | POA: Diagnosis not present

## 2016-03-01 DIAGNOSIS — Z5181 Encounter for therapeutic drug level monitoring: Secondary | ICD-10-CM

## 2016-03-01 LAB — POCT INR: INR: 2.2

## 2016-03-01 NOTE — Telephone Encounter (Signed)
Must have an appointment for reassessment before changing narcotic regimen.

## 2016-03-02 NOTE — Telephone Encounter (Signed)
[place], scheduling an appt, pt cannot come 7/12 or 7/17 or before 10am any day

## 2016-03-02 NOTE — Telephone Encounter (Signed)
Called pt back and informed him of dr [name] More answer, will speak w/ doriss. Concerning appt

## 2016-03-08 ENCOUNTER — Encounter: Payer: Self-pay | Admitting: Internal Medicine

## 2016-03-08 ENCOUNTER — Ambulatory Visit (INDEPENDENT_AMBULATORY_CARE_PROVIDER_SITE_OTHER): Payer: Medicare Other | Admitting: Internal Medicine

## 2016-03-08 VITALS — BP 133/76 | HR 58 | Temp 97.9°F | Ht 72.0 in | Wt 292.4 lb

## 2016-03-08 DIAGNOSIS — G894 Chronic pain syndrome: Secondary | ICD-10-CM

## 2016-03-08 DIAGNOSIS — M25562 Pain in left knee: Secondary | ICD-10-CM | POA: Diagnosis not present

## 2016-03-08 MED ORDER — DICLOFENAC SODIUM 1 % TD GEL: 4.0000 g | Freq: Four times a day (QID) | TRANSDERMAL | Status: DC

## 2016-03-08 NOTE — Assessment & Plan Note (Signed)
Patient is here after visiting the ER on the 6/27 for his left knee pain. He said that he was given Dilaudid injection, and then given 20 tablets of dilaudid for home and asked to follow up in our clinic. He said the dilaudid was very effective for the pain, and that he ran out of his 20 pills and he wanted more. He says the oxycodone is not as effective as dilaudid.   On exam, no erythema or warmth to suggest an acute process. Knee xray from 6/27 shows chronic osteomyelitic changes but no acute pathology.  He is currently on a slow taper of oxycodone 10-325 1 tablet q6 hours,, and a 3 month supply was given on April 21st, which should last until July 21st. The plan was to reduce his oxycodone use to < 50 morphine milliequivalent a day.   He had seen Orthopedic Dr Percell Miller and per his last clinic note, they recommend that he undergo total knee arthroplasty, but he is a poor surgical candidate due to his chronic osteomyelitis. They had drawn some labwork on him including ESR, CRP that may suggest any inflammatory process going on. Pt has not followed up with Dr. Percell Miller, but did see someone at [facility] per their suggestion. It will be beneficial to re-assess his surgical candidacy after the labwork results are back, which we do not see in our system.   Plan -Continue the current oxycodone taper. We are not giving any dilaudid.  -Given voltaren gel -Encouraged to follow up with Dr Percell Miller to re-assess the candidacy after the recent labwork that was obtained there. -Follow up with Dr. Alfonso Ramus as needed for injections of the knee.

## 2016-03-08 NOTE — Progress Notes (Signed)
Patient ID: GANT GUSTASON, male   DOB: 28-Oct-1955, 60 y.o.   MRN: [id number]    CC: left knee pain HPI: Mr.Gurnie BENJAMAN AZOULAY is a 60 y.o. man with PMH noted below here for chronic left knee pain after an ER visit.  Please see Problem List/A&P for the status of the patient's chronic medical problems   Past Medical History  Diagnosis Date  . Essential hypertension    . Persistent atrial fibrillation ([place])    . Chronic diastolic heart failure ([place])      with mild left ventricular hypertrophy on Echo 02/2010  . Chronic pain syndrome      Left arm and leg s/p traumatic injury   . Osteoarthritis cervical spine    . Gastroesophageal reflux disease    . Open-angle glaucoma    . 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 (HCC)    . Hyperlipidemia LDL goal < 100    . Coronary artery disease      25% LAD stenosis on cath 2007.  Stable angina.  . Gout    . Right rotator cuff tear      Large full-thickness tear of the supraspinatus with mild retraction but no atrophy   . Obstructive sleep apnea      Moderate, AHI 29.8 per hour with moderately loud snoring and oxygen desaturation to a nadir of 79%. CPAP titration resulted in a prescription for 17 CWP.    [name] Cataract     Left eye  . Morbid obesity with BMI of 40.0-44.9, adult ([place])    . Chronic obstructive pulmonary disease ([place])    . Diverticulosis    . Osteoarthritis of left knee 06/19/2013    Tricompartmental disease.  Treated with double hinged upright knee brace, steroid/xylocaine knee injections, and NSAIDs   . Chronic renal insufficiency    . Alcohol abuse    . Subclinical hypothyroidism    . Normocytic anemia    . Paroxysmal atrial fibrillation ([place]) 10/25/2015  . Mild carpal tunnel syndrome of right wrist 01/24/2016    Mild degree electrically per EMG 01/24/2016   . C6 radiculopathy 01/24/2016    Right upper extremity, mild to moderate electrically by EMG on 01/24/2016    Review of  Systems:  Constitutional: Negative for fever, chills, weight loss and malaise/fatigue.  Gastrointestinal: Negative for  nausea, vomiting,  Musculoskeletal: chronic left knee pain   Neurological: Negative for tingling.    Physical Exam: Filed Vitals:   03/08/16 1319  BP: 133/76  Pulse: 58  Temp: 97.9 F (36.6 C)  TempSrc: Oral  Height: 6' (1.829 m)  Weight: 292 lb 6.4 oz (132.632 kg)  SpO2: 98%    General: A&O, in NAD CV: RRR, normal s1, s2, no m/r/g, Resp: equal and symmetric breath sounds, no wheezing or crackles  Abdomen: soft, nontender, nondistended, +BS Extremities: pulses intact b/l, no edema,     Left leg- chronic post-surgical skin changes. No erythema or warmth. Mild crepitus. Intact popliteal pulse. Nontender to palpation Neurologic: no focal neurological deficits    Assessment & Plan:   See encounters tab for problem based medical decision making. Patient discussed with Dr. Dareen Piano

## 2016-03-08 NOTE — Patient Instructions (Signed)
Thank you for your visit today Please use Voltaren gel as needed Please continue your current schedule of oxycodone Please follow up with Dr. Percell Miller about the results of your blood work to re-assess whether it would be appropriate to do surgery, and for your 3 month injections

## 2016-03-09 ENCOUNTER — Other Ambulatory Visit: Payer: Self-pay | Admitting: Internal Medicine

## 2016-03-09 DIAGNOSIS — H4010X0 Unspecified open-angle glaucoma, stage unspecified: Secondary | ICD-10-CM

## 2016-03-09 DIAGNOSIS — G894 Chronic pain syndrome: Secondary | ICD-10-CM

## 2016-03-09 MED ORDER — OXYCODONE-ACETAMINOPHEN 10-325 MG PO TABS: 1.0000 | ORAL_TABLET | Freq: Four times a day (QID) | ORAL | Status: DC | PRN

## 2016-03-09 NOTE — Telephone Encounter (Signed)
Pt was seen in [facility] on 03/08/16 to f/u up on two recent ED visits.  During [facility] appt, pt requests his 3 oxycodone prescriptions be written. Pt last filled oxycodone on 6/27 (confirmed w/pharmacy) and dilaudid 2 mg #20 on 6/28 (prescribed in ED).  MD seeing patient unable to fill so pt has asked that request be sent to pcp.   Please advise.[name]7/7/20171:54 PM

## 2016-03-09 NOTE — Telephone Encounter (Signed)
I have written the next three prescriptions with the continued #1 tablet/month wean until we get to about #100 tablets/month (MED < 50 in man with EtOH abuse).  I refilled the eye drops but only 1 bottle with no refills.  Future refills will require that I have access to the Ophthalmology progress notes to update our records on the status of his open angle glaucoma and to assure there is appropriate pressure management.

## 2016-03-12 NOTE — Progress Notes (Signed)
Internal Medicine Clinic Attending  Case discussed with Dr. Saraiya at the time of the visit.  We reviewed the resident's history and exam and pertinent patient test results.  I agree with the assessment, diagnosis, and plan of care documented in the resident's note.  

## 2016-03-29 ENCOUNTER — Ambulatory Visit (INDEPENDENT_AMBULATORY_CARE_PROVIDER_SITE_OTHER): Payer: Medicare Other | Admitting: Pharmacist

## 2016-03-29 DIAGNOSIS — Z5181 Encounter for therapeutic drug level monitoring: Secondary | ICD-10-CM | POA: Diagnosis not present

## 2016-03-29 DIAGNOSIS — Z7901 Long term (current) use of anticoagulants: Secondary | ICD-10-CM

## 2016-03-29 DIAGNOSIS — I4891 Unspecified atrial fibrillation: Secondary | ICD-10-CM

## 2016-03-29 LAB — POCT INR: INR: 2.5

## 2016-04-04 DIAGNOSIS — M25462 Effusion, left knee: Secondary | ICD-10-CM | POA: Diagnosis not present

## 2016-04-04 DIAGNOSIS — M1992 Post-traumatic osteoarthritis, unspecified site: Secondary | ICD-10-CM | POA: Diagnosis not present

## 2016-04-04 DIAGNOSIS — M170 Bilateral primary osteoarthritis of knee: Secondary | ICD-10-CM | POA: Diagnosis not present

## 2016-04-06 ENCOUNTER — Other Ambulatory Visit: Payer: Self-pay | Admitting: Cardiovascular Disease

## 2016-04-06 ENCOUNTER — Encounter: Payer: Self-pay | Admitting: Internal Medicine

## 2016-04-06 ENCOUNTER — Other Ambulatory Visit: Payer: Self-pay | Admitting: Internal Medicine

## 2016-04-06 ENCOUNTER — Ambulatory Visit (INDEPENDENT_AMBULATORY_CARE_PROVIDER_SITE_OTHER): Payer: Medicare Other | Admitting: Internal Medicine

## 2016-04-06 VITALS — BP 112/61 | HR 66 | Temp 97.7°F | Wt 293.8 lb

## 2016-04-06 DIAGNOSIS — I4891 Unspecified atrial fibrillation: Secondary | ICD-10-CM

## 2016-04-06 DIAGNOSIS — I480 Paroxysmal atrial fibrillation: Secondary | ICD-10-CM | POA: Diagnosis not present

## 2016-04-06 DIAGNOSIS — M1732 Unilateral post-traumatic osteoarthritis, left knee: Secondary | ICD-10-CM | POA: Diagnosis not present

## 2016-04-06 DIAGNOSIS — E669 Obesity, unspecified: Secondary | ICD-10-CM | POA: Diagnosis not present

## 2016-04-06 DIAGNOSIS — N521 Erectile dysfunction due to diseases classified elsewhere: Secondary | ICD-10-CM | POA: Diagnosis not present

## 2016-04-06 DIAGNOSIS — I5032 Chronic diastolic (congestive) heart failure: Secondary | ICD-10-CM | POA: Diagnosis not present

## 2016-04-06 DIAGNOSIS — E1149 Type 2 diabetes mellitus with other diabetic neurological complication: Secondary | ICD-10-CM | POA: Diagnosis not present

## 2016-04-06 DIAGNOSIS — L97929 Non-pressure chronic ulcer of unspecified part of left lower leg with unspecified severity: Secondary | ICD-10-CM

## 2016-04-06 DIAGNOSIS — I10 Essential (primary) hypertension: Secondary | ICD-10-CM

## 2016-04-06 DIAGNOSIS — Z7901 Long term (current) use of anticoagulants: Secondary | ICD-10-CM

## 2016-04-06 DIAGNOSIS — G894 Chronic pain syndrome: Secondary | ICD-10-CM

## 2016-04-06 DIAGNOSIS — I83229 Varicose veins of left lower extremity with both ulcer of unspecified site and inflammation: Secondary | ICD-10-CM

## 2016-04-06 DIAGNOSIS — E1140 Type 2 diabetes mellitus with diabetic neuropathy, unspecified: Secondary | ICD-10-CM

## 2016-04-06 DIAGNOSIS — M86659 Other chronic osteomyelitis, unspecified thigh: Secondary | ICD-10-CM

## 2016-04-06 DIAGNOSIS — X58XXXS Exposure to other specified factors, sequela: Secondary | ICD-10-CM

## 2016-04-06 DIAGNOSIS — G4733 Obstructive sleep apnea (adult) (pediatric): Secondary | ICD-10-CM

## 2016-04-06 DIAGNOSIS — T1490 Injury, unspecified: Secondary | ICD-10-CM

## 2016-04-06 DIAGNOSIS — I110 Hypertensive heart disease with heart failure: Secondary | ICD-10-CM

## 2016-04-06 DIAGNOSIS — Z7984 Long term (current) use of oral hypoglycemic drugs: Secondary | ICD-10-CM

## 2016-04-06 DIAGNOSIS — Z79899 Other long term (current) drug therapy: Secondary | ICD-10-CM | POA: Diagnosis not present

## 2016-04-06 DIAGNOSIS — M86652 Other chronic osteomyelitis, left thigh: Secondary | ICD-10-CM

## 2016-04-06 DIAGNOSIS — Z6839 Body mass index (BMI) 39.0-39.9, adult: Secondary | ICD-10-CM | POA: Diagnosis not present

## 2016-04-06 DIAGNOSIS — Z79891 Long term (current) use of opiate analgesic: Secondary | ICD-10-CM | POA: Diagnosis not present

## 2016-04-06 DIAGNOSIS — I83029 Varicose veins of left lower extremity with ulcer of unspecified site: Secondary | ICD-10-CM

## 2016-04-06 HISTORY — DX: Other chronic osteomyelitis, unspecified thigh: M86.659

## 2016-04-06 LAB — POCT GLYCOSYLATED HEMOGLOBIN (HGB A1C): HEMOGLOBIN A1C: 5.7

## 2016-04-06 LAB — GLUCOSE, CAPILLARY: Glucose-Capillary: 135 mg/dL — ABNORMAL HIGH (ref 65–99)

## 2016-04-06 MED ORDER — MORPHINE SULFATE ER 15 MG PO TBCR: 15.0000 mg | EXTENDED_RELEASE_TABLET | Freq: Two times a day (BID) | ORAL | 0 refills | Status: DC

## 2016-04-06 NOTE — Assessment & Plan Note (Signed)
Assessment  Symptomatically he is doing well on torsemide 40 mg by mouth 3 times a week, valsartan 160 mg by mouth daily, and metoprolol 25 mg by mouth twice daily. His baseline weight is about 289 pounds. His weight today is 292 pounds. He has not taken his torsemide 2 days and is due for his next dose tomorrow.  Plan  We will continue his current regimen as noted above at the current doses since he is symptomatically well controlled and close to his baseline weight. We will reassess his symptoms associated with his chronic diastolic heart failure at the follow-up visit.

## 2016-04-06 NOTE — Assessment & Plan Note (Signed)
Assessment  He has received dental clearance and will soon be fitted for his orthotic for his obstructive sleep apnea.   Plan  He was encouraged to complete the manufacturer of his orthotic for his obstructive sleep apnea and we will assess his compliance as well as his symptomatic response at the follow-up visit.

## 2016-04-06 NOTE — Telephone Encounter (Signed)
Pt was seen seen today.

## 2016-04-06 NOTE — Assessment & Plan Note (Signed)
Assessment  His diabetes was under excellent control today with a hemoglobin A1c of 5.7 down from 9 three months ago. This is on glipizide 5 mg by mouth daily and metformin 1000 mg by mouth twice daily. He has tried to improve his diet and decrease the amount of sodas he is drinking.  Plan  We will continue the glipizide at 5 mg by mouth daily and metformin 1000 mg by mouth twice daily given his excellent control. This is crucial if he is to undergo a left total knee arthroplasty in the future. He was praised on his much improved diabetic control. We will reassess his diabetes at the follow-up visit with a repeat hemoglobin A1c.

## 2016-04-06 NOTE — Assessment & Plan Note (Signed)
Assessment  On physical exam today his rate was regularly regular and I suspect he was still in normal sinus rhythm on the flecainide. He has been anticoagulated with Coumadin is followed by the anticoagulation clinic.  Plan  We will continue with the flecainide at the current dose which she is tolerating well and the warfarin therapy as anticoagulation for his paroxysmal atrial fibrillation with continued follow-up in the anticoagulation clinic.

## 2016-04-06 NOTE — Assessment & Plan Note (Signed)
Assessment  Juan Calderon' posttraumatic left knee osteoarthritis has gradually worsened since the last visit. It is now a constant pain that has moved from his knee up into his thigh. His gait has become more antalgic and is starting to impact upon his right knee. He has been seen by orthopedic surgery here in [place] as well as orthopedic surgery at [facility] and [facility]. Although he desperately needs a left knee arthroplasty he's felt to be very high risk because of his multiple medical conditions in addition to what is felt to be chronic osteo of the left distal femur. The surgeons feel that if he were to undergo the surgery there is a high likelihood that he could ultimately end up with a left above-the-knee amputation. He is very much against this possibility, obviously. Thus, we are between the proverbial rock and hard place where surgical options are not a viable option at this point and the medical options have been rather ineffective to date. In particular, he states that the dilaudid was no better than the Percocet. The Voltaren gel was also ineffective. He would be interested in additional pain medication in order to improve function. He also asked a very good question of whether or not the osteomyelitis may be treatable and therefore increase the likelihood of successful total left knee arthroplasty.  Plan  We will continue with the Percocet 1 tablet every 6 hours as needed with a slow taper down to an 110 tablets per month. We are tapering by 1 tablet per month. We are also starting MS Contin 15 mg by mouth twice daily. This will be slowly titrated given the constant and chronic nature of his pain and the fact that we have little else to offer at this point. He has 3 month supply of handwritten prescriptions given to him in this clinic visit. I obtained an ESR and C-reactive protein to see if they're elevated and confirm the possibility he may have chronic left distal femur osteomyelitis. If they  are elevated I will call him upon my return from vacation and discuss whether or not he would like referral to the infectious disease clinic to assess for any possible treatment or need for additional workup before treatment is begun. This may be an avenue in which we can help limit the risks if total knee arthroplasty is needed in the future. He will continue to follow with orthopedic surgery locally and get every 3-4 month steroid injections. We will reassess the control of his chronic pain with the addition of MS Contin although I fully expect this will need to be titrated upward somewhat. 

## 2016-04-06 NOTE — Assessment & Plan Note (Signed)
Assessment  His weight is stable today down 1 pound from the previous visit to 292.  Plan  He was encouraged to continue to work on his diet and portion control. We will follow-up on his success in losing weight at the follow-up visit.

## 2016-04-06 NOTE — Progress Notes (Signed)
   Subjective:    Patient ID: Juan Calderon, male    DOB: [birth year], 60 y.o.   MRN: [id number]  HPI  UMAIR GRADDICK is here for follow-up of diabetes, diastolic heart failure, obesity and chronic left leg pain. Please see the A&P for the status of the pt's chronic medical problems.  Review of Systems  Constitutional: Negative for activity change, appetite change and unexpected weight change.  Respiratory: Negative for chest tightness.   Cardiovascular: Positive for leg swelling. Negative for chest pain and palpitations.       Unchanged from chronic baseline  Gastrointestinal: Positive for constipation. Negative for abdominal distention, abdominal pain, diarrhea, nausea and vomiting.       Treated with sorbitol  Musculoskeletal: Positive for arthralgias and gait problem.       L knee > R knee  Skin: Positive for wound.       Chronic post traumatic leg changes  Neurological: Negative for dizziness.      Objective:   Physical Exam  Constitutional: He is oriented to person, place, and time. He appears well-developed and well-nourished. No distress.  HENT:  Head: Normocephalic and atraumatic.  Eyes: Conjunctivae are normal. Right eye exhibits no discharge. Left eye exhibits no discharge. No scleral icterus.  Cardiovascular: Normal rate, regular rhythm and normal heart sounds.  Exam reveals no gallop and no friction rub.   No murmur heard. Pulmonary/Chest: Effort normal. No respiratory distress. He has no wheezes. He has no rales.  Bibasilar inspiratory crackles  Abdominal: Soft. Bowel sounds are normal. He exhibits no distension. There is no tenderness. There is no rebound and no guarding.  Musculoskeletal: He exhibits edema, tenderness and deformity.  All unchanged from baseline  Neurological: He is alert and oriented to person, place, and time.  Skin: Skin is warm and dry. No rash noted. He is not diaphoretic. No erythema.  Psychiatric: He has a normal mood and affect. His  behavior is normal. Judgment and thought content normal.  Nursing note and vitals reviewed.     Assessment & Plan:   Please see problem based charting.

## 2016-04-06 NOTE — Assessment & Plan Note (Signed)
Assessment  His blood pressure was very well controlled today at 112/61. This is on metoprolol 25 mg by mouth twice daily, valsartan 160 mg by mouth daily, terazosin 5 mg by mouth each night, torsemide 20 mg by mouth as needed.  Plan  We will continue with this antihypertensive regimen at its current dose schedule and follow-up on control at the return visit with a repeat blood pressure. A basic metabolic panel is pending at time of this dictation.

## 2016-04-06 NOTE — Assessment & Plan Note (Signed)
Assessment  He continues to have posttraumatic chronic venous insufficiency of his left lower extremity. He states the compression stocking that we have previously prescribed at 20-22 mmHg pressure was now ineffective. He did not believe that it was related to aging of the stocking. He asked that I rewrite the prescription for a higher degree of pressure.  Plan  I will write for a left compression stocking that his thigh-high at 23-25 mmHg to see if we can help improve the swelling associated with his posttraumatic chronic venous insufficiency of the left lower extremity. We will reassess the success of this higher pressure compression stocking at the follow-up visit with reassessment of his lower extremity edema on the left.

## 2016-04-06 NOTE — Patient Instructions (Signed)
It was great to see you again.  I am glad we had time to talk about your leg in more detail.  1) Keep taking all of your medications as you are.  2) I added MS Contin 15 mg by mouth daily to your regimen for pain.  You have to take this medication everyday, even if you are not in pain to allow it to work.  You will not notice any difference at first and this is normal.  3) I checked some labs.  I will call you if they are abnormal, especially with regards to your leg.  When we talk we will decide about an Infectious Disease referral.  4) I will be going out of town for 7 days and I will not have access to these results, Please do not expect a call from me for the next 10 days.  If there is a major abnormality, one of my partners will call to inform you before then.  5) Great work on the diabetes, keep it up!  6) I will rewrite the referral for new compression stockings for the left leg.  7) I will look into the status of the power wheelchair paperwork.  I will see you back in 3 months, sooner if necessary.

## 2016-04-07 LAB — CMP14 + ANION GAP
A/G RATIO: 1.5 (ref 1.2–2.2)
ALK PHOS: 92 IU/L (ref 39–117)
ALT: 29 IU/L (ref 0–44)
AST: 28 IU/L (ref 0–40)
Albumin: 3.9 g/dL (ref 3.6–4.8)
Anion Gap: 18 mmol/L (ref 10.0–18.0)
BUN / CREAT RATIO: 22 (ref 10–24)
BUN: 23 mg/dL (ref 8–27)
CHLORIDE: 105 mmol/L (ref 96–106)
CO2: 20 mmol/L (ref 18–29)
Calcium: 8.9 mg/dL (ref 8.6–10.2)
Creatinine, Ser: 1.03 mg/dL (ref 0.76–1.27)
GFR calc non Af Amer: 78 mL/min/{1.73_m2} (ref >59)
GFR, EST AFRICAN AMERICAN: 90 mL/min/{1.73_m2} (ref >59)
GLUCOSE: 82 mg/dL (ref 65–99)
Globulin, Total: 2.6 g/dL (ref 1.5–4.5)
POTASSIUM: 5.1 mmol/L (ref 3.5–5.2)
Sodium: 143 mmol/L (ref 134–144)
TOTAL PROTEIN: 6.5 g/dL (ref 6.0–8.5)

## 2016-04-07 LAB — C-REACTIVE PROTEIN: CRP: 26.1 mg/L — AB (ref 0.0–4.9)

## 2016-04-07 LAB — SEDIMENTATION RATE: Sed Rate: 37 mm/hr — ABNORMAL HIGH (ref 0–30)

## 2016-04-16 NOTE — Progress Notes (Signed)
Patient ID: Juan Calderon, male   DOB: 07/25/1956, 60 y.o.   MRN: [id number]  BMP and LFTs unremarkable.  CRP 26.1 ESR 37  Elevated markers of inflammation could be consistent with chronic osteomyelitis of left leg.  I called Juan Calderon with this information and we discussed getting the opinion of our ID group to see if additional evaluation is necessary and if he may be a candidate for therapy for this issue, if present, that may lessen the risks if future surgery is done.  He was interested in this referral and it will be placed.  He misplaced the phone number for the stoking fitting and we will get this to him again.  I will also inquire as to where we are with the power chair issue.

## 2016-04-27 ENCOUNTER — Other Ambulatory Visit: Payer: Self-pay | Admitting: Internal Medicine

## 2016-04-27 DIAGNOSIS — K5903 Drug induced constipation: Secondary | ICD-10-CM

## 2016-04-27 DIAGNOSIS — T402X5A Adverse effect of other opioids, initial encounter: Principal | ICD-10-CM

## 2016-05-01 ENCOUNTER — Other Ambulatory Visit: Payer: Self-pay | Admitting: Internal Medicine

## 2016-05-01 ENCOUNTER — Telehealth: Payer: Self-pay

## 2016-05-01 DIAGNOSIS — M86652 Other chronic osteomyelitis, left thigh: Secondary | ICD-10-CM

## 2016-05-01 NOTE — Telephone Encounter (Signed)
Call pt - states he has been out Percocet last 2 days; talked to the pharmacy, might be able to refill today. MS Contin - will take last tablet tomorrow morning then will be out till 9/4 when it can be refilled.

## 2016-05-01 NOTE — Telephone Encounter (Signed)
Requesting morphine and oxycodone to be filled. Please call pt back.

## 2016-05-01 NOTE — Telephone Encounter (Signed)
Thank you for the information.    He should have the prescriptions and if he is taking them as prescribed should have enough until he is due for the next prescription.  I will not refill any narcotics early and Juan Calderon should know this as it is in his pain contract.  If he would like to discuss this with me please make an appointment with me in the clinic at an available slot.

## 2016-05-02 ENCOUNTER — Other Ambulatory Visit: Payer: Self-pay | Admitting: Internal Medicine

## 2016-05-02 DIAGNOSIS — I10 Essential (primary) hypertension: Secondary | ICD-10-CM

## 2016-05-02 NOTE — Telephone Encounter (Signed)
I confirmed with pharmacy-pt does not have any additional refill on file.[name], Roosevelt Eimers Cassady8/30/20179:29 AM

## 2016-05-06 ENCOUNTER — Other Ambulatory Visit: Payer: Self-pay | Admitting: Internal Medicine

## 2016-05-06 DIAGNOSIS — K219 Gastro-esophageal reflux disease without esophagitis: Secondary | ICD-10-CM

## 2016-05-10 ENCOUNTER — Ambulatory Visit (INDEPENDENT_AMBULATORY_CARE_PROVIDER_SITE_OTHER): Payer: Medicare Other | Admitting: *Deleted

## 2016-05-10 DIAGNOSIS — I4891 Unspecified atrial fibrillation: Secondary | ICD-10-CM

## 2016-05-10 DIAGNOSIS — Z7901 Long term (current) use of anticoagulants: Secondary | ICD-10-CM

## 2016-05-10 DIAGNOSIS — Z5181 Encounter for therapeutic drug level monitoring: Secondary | ICD-10-CM

## 2016-05-10 LAB — POCT INR: INR: 3.8

## 2016-05-14 DIAGNOSIS — M170 Bilateral primary osteoarthritis of knee: Secondary | ICD-10-CM | POA: Diagnosis not present

## 2016-05-23 ENCOUNTER — Telehealth: Payer: Self-pay | Admitting: Internal Medicine

## 2016-05-23 NOTE — Telephone Encounter (Signed)
APT. REMINDER CALL, NO ANSWER, NO VOICEMAIL °

## 2016-05-24 ENCOUNTER — Ambulatory Visit (INDEPENDENT_AMBULATORY_CARE_PROVIDER_SITE_OTHER): Payer: Medicare Other | Admitting: Internal Medicine

## 2016-05-24 DIAGNOSIS — Z23 Encounter for immunization: Secondary | ICD-10-CM

## 2016-05-24 DIAGNOSIS — G894 Chronic pain syndrome: Secondary | ICD-10-CM

## 2016-05-24 DIAGNOSIS — N521 Erectile dysfunction due to diseases classified elsewhere: Secondary | ICD-10-CM

## 2016-05-24 DIAGNOSIS — E1140 Type 2 diabetes mellitus with diabetic neuropathy, unspecified: Secondary | ICD-10-CM

## 2016-05-24 DIAGNOSIS — Z026 Encounter for examination for insurance purposes: Secondary | ICD-10-CM | POA: Diagnosis not present

## 2016-05-24 DIAGNOSIS — M1732 Unilateral post-traumatic osteoarthritis, left knee: Secondary | ICD-10-CM | POA: Diagnosis not present

## 2016-05-24 DIAGNOSIS — X58XXXS Exposure to other specified factors, sequela: Secondary | ICD-10-CM

## 2016-05-24 DIAGNOSIS — Z0000 Encounter for general adult medical examination without abnormal findings: Secondary | ICD-10-CM

## 2016-05-24 DIAGNOSIS — M1A00X0 Idiopathic chronic gout, unspecified site, without tophus (tophi): Secondary | ICD-10-CM | POA: Diagnosis not present

## 2016-05-24 DIAGNOSIS — E1149 Type 2 diabetes mellitus with other diabetic neurological complication: Secondary | ICD-10-CM

## 2016-05-24 DIAGNOSIS — I5032 Chronic diastolic (congestive) heart failure: Secondary | ICD-10-CM

## 2016-05-24 DIAGNOSIS — Z79899 Other long term (current) drug therapy: Secondary | ICD-10-CM | POA: Diagnosis not present

## 2016-05-24 MED ORDER — MORPHINE SULFATE ER 30 MG PO TBCR: 30.0000 mg | EXTENDED_RELEASE_TABLET | Freq: Two times a day (BID) | ORAL | 0 refills | Status: DC

## 2016-05-24 MED ORDER — MAGNESIUM OXIDE 400 MG PO TABS: 400.0000 mg | ORAL_TABLET | Freq: Every day | ORAL | 3 refills | Status: DC

## 2016-05-24 MED ORDER — GLUCOSE BLOOD VI STRP: ORAL_STRIP | 12 refills | Status: AC

## 2016-05-24 MED ORDER — OXYCODONE-ACETAMINOPHEN 10-325 MG PO TABS: 1.0000 | ORAL_TABLET | Freq: Four times a day (QID) | ORAL | 0 refills | Status: DC | PRN

## 2016-05-24 MED ORDER — ONETOUCH DELICA LANCETS 33G MISC: 5 refills | Status: AC

## 2016-05-24 MED ORDER — ALLOPURINOL 300 MG PO TABS: 300.0000 mg | ORAL_TABLET | Freq: Every day | ORAL | 3 refills | Status: DC

## 2016-05-24 NOTE — Assessment & Plan Note (Signed)
His chronic diastolic heart failure was not assessed during this visit but he required a refill of his magnesium oxide. This was provided.

## 2016-05-24 NOTE — Assessment & Plan Note (Signed)
Assessment  He continues to have significant pain in the left lower extremity that has worsened over the last several months. As noted above he is a good candidate for a power wheelchair because of the limitations as outlined and the physical exam as documented.  Plan  The paperwork will be submitted for review and hopeful approval. As his pain is ineffectively controlled with the MS Contin 15 mg by mouth twice daily this medication was increased to 30 mg by mouth twice daily. He is continuing with the Percocet and we are decreasing the tablets by 1 per month. My new goal is 5 given the start of the MS Contin at the last visit. He was given 2 additional written prescriptions for the MS Contin and Percocet which should get him through December 24. We will reassess his pain on this new regimen and his ability to perform some of the activities of daily living if he were to qualify for the power wheelchair.

## 2016-05-24 NOTE — Assessment & Plan Note (Signed)
His gout was not assessed during this visit but he required a refill of his allopurinol. This was provided.

## 2016-05-24 NOTE — Assessment & Plan Note (Signed)
His diabetes was not assessed during this visit but he needed refills for his test strips and lancets. These were provided.

## 2016-05-24 NOTE — Assessment & Plan Note (Signed)
While here we provided him with the flu vaccination.

## 2016-05-24 NOTE — Progress Notes (Signed)
   Subjective:    Patient ID: Juan Calderon, male    DOB: July 04, 1956, 60 y.o.   MRN: [id number]  HPI  Juan Calderon is here for a power wheelchair evaluation. His other chronic medical problems were not addressed other than to refill some medications.  The diagnoses for which he requires a power wheelchair include posttraumatic osteoarthritis of the left knee, C6 cervical radiculopathy secondary to osteoarthritis of the cervical spine exacerbated by use of the right upper extremity, chronic osteomyelitis of the left femur, and right rotator cuff tear all resulting in a chronic pain syndrome. The activities of daily living that are hindered by this severe pain and inability to ambulate around the house, include bathing, cleaning, and cooking. A cane has been ineffective in helping him ambulate without pain. In addition, he has fallen despite using the cane. He is unable to self propel himself with a manual wheelchair secondary to his right rotator cuff injury and right C6 cervical radiculopathy that is exacerbated by any significant muscular activity needed with the right arm. A scooter is not an option secondary to his inability to easily transfer in and out of the scooter. He has significant difficulty with his left leg which hinders this safe transferring and the concern is that he may get his leg caught while getting into or out of the scooter. Fortunately, he has the physical and mental ability to operate a power wheelchair safely in the home and is willing and motivated to use the power wheelchair.  Review of Systems  Constitutional: Positive for activity change.       His left leg pain continues to worsen making ambulation very painful and standing, even briefly, very painful.  Cardiovascular: Positive for leg swelling.       Chronic post traumatic left lower extremity edema.  Musculoskeletal: Positive for arthralgias, gait problem and neck pain. Negative for joint swelling.       Left leg  pain continues to worsen.  Cervical neck pain (radiculopathy)  is exacerbated by any activity that requires the use of his arms.  Neurological: Positive for weakness.      Objective:   Physical Exam  His height is 6 feet tall and his weight is 293 pounds. Left leg pain is rated 5/10 today. His neck is tender to palpation in the cervical spine. Left upper extremity strength is 5/5 throughout Right upper extremity strength is 4/5 with flexion, extension, and abduction at the shoulder. He has pain with moving his right shoulder through internal and external rotation. Right lower extremity strength is 5/5 throughout Left hip flexion is 4/5, left hip extension is 4/5, left knee extension is 4/5, left knee flexion is 4/5, left foot extension is 4/5, and left foot plantar flexion is 4/5. This strength in the left lower extremity is limited secondary to significant pain with movement.     Assessment & Plan:   Please see problem oriented charting.

## 2016-05-24 NOTE — Patient Instructions (Signed)
It was good to see you again.  Today's visit was for a power wheelchair assessment.  1) I will complete all of the necessary paperwork documenting the evaluation and have it submitted.  2) I increased your MS Contin to 30 mg twice daily.  3) You have been given 2 new narcotic prescriptions to get your through 08/26/2016  I will see you back in 3 months, sooner if necessary.

## 2016-05-31 ENCOUNTER — Ambulatory Visit (INDEPENDENT_AMBULATORY_CARE_PROVIDER_SITE_OTHER): Payer: Medicare Other | Admitting: Internal Medicine

## 2016-05-31 ENCOUNTER — Encounter: Payer: Self-pay | Admitting: Internal Medicine

## 2016-05-31 VITALS — BP 127/81 | HR 55 | Temp 97.5°F | Ht 72.0 in | Wt 279.0 lb

## 2016-05-31 DIAGNOSIS — M86652 Other chronic osteomyelitis, left thigh: Secondary | ICD-10-CM

## 2016-05-31 DIAGNOSIS — I5032 Chronic diastolic (congestive) heart failure: Secondary | ICD-10-CM | POA: Diagnosis not present

## 2016-05-31 DIAGNOSIS — E038 Other specified hypothyroidism: Secondary | ICD-10-CM | POA: Diagnosis not present

## 2016-05-31 DIAGNOSIS — G894 Chronic pain syndrome: Secondary | ICD-10-CM | POA: Diagnosis not present

## 2016-05-31 DIAGNOSIS — N182 Chronic kidney disease, stage 2 (mild): Secondary | ICD-10-CM | POA: Diagnosis not present

## 2016-05-31 DIAGNOSIS — D649 Anemia, unspecified: Secondary | ICD-10-CM | POA: Diagnosis not present

## 2016-05-31 DIAGNOSIS — I4891 Unspecified atrial fibrillation: Secondary | ICD-10-CM | POA: Diagnosis not present

## 2016-05-31 DIAGNOSIS — G253 Myoclonus: Secondary | ICD-10-CM | POA: Diagnosis not present

## 2016-05-31 DIAGNOSIS — I25119 Atherosclerotic heart disease of native coronary artery with unspecified angina pectoris: Secondary | ICD-10-CM | POA: Diagnosis not present

## 2016-05-31 DIAGNOSIS — E785 Hyperlipidemia, unspecified: Secondary | ICD-10-CM | POA: Diagnosis not present

## 2016-05-31 DIAGNOSIS — Z7901 Long term (current) use of anticoagulants: Secondary | ICD-10-CM | POA: Diagnosis not present

## 2016-05-31 DIAGNOSIS — I130 Hypertensive heart and chronic kidney disease with heart failure and stage 1 through stage 4 chronic kidney disease, or unspecified chronic kidney disease: Secondary | ICD-10-CM | POA: Diagnosis present

## 2016-05-31 NOTE — Progress Notes (Signed)
[facility] for Infectious Disease      Reason for Consult:? Chronic osteomyelitis    Referring Physician: Dr. Eppie Gibson    Patient ID: Juan Calderon, male    DOB: July 11, 1956, 60 y.o.   MRN: [id number]  HPI:   He is here due to increased pain of his left leg and concern for chronic osteomyelitis.  His history of left leg problems stems from a traumatic injury in 1979 resulting in extensive reconstructive surgery of left leg including femur.  He tells me for about one year, he has had increasing pain in his left knee and upper leg and has been evaluated by orthopedics here and in [facility].  He was seen initially then in October 2016 for the pain and ? Osteomyelitis due to findings of bone remodeling on the xray and was not felt to be c/w osteo and did not warrant bone biopsy.  He at the time responded to injections but since has continued pain.  On follow up with them he was advised to continue with non-surgical care.  Apparently he has had similar recommendations from Dr. Mardelle Matte as well.  He has no history of infection of the bone to his recollection, no history of needing IV antibiotics.  There has been no sinus tract, no drainage, no point tenderness.  He had elevated inflammatory markers with an ESR of 37 and CRP of 26.1 in August.  No previous results noted.  He describes the pain as originating from the knee and moves up along the muscle.   xray independently reviewed and no change in bone areas of the femoral between 05/2015 and 02/2016. The second xray listed bone changes as 'chronic osteomyelitis'.    Previous record reviewed in Care Everywhere and saw orthopedics in [place] as above.    Past Medical History:  Diagnosis Date  . Alcohol abuse    . C6 radiculopathy 01/24/2016   Right upper extremity, mild to moderate electrically by EMG on 01/24/2016  . Cataract    Left eye  . Chronic diastolic heart failure ([place])     with mild left ventricular hypertrophy on Echo 02/2010  .  Chronic obstructive pulmonary disease ([place])    . Chronic osteomyelitis of femur ([place]) 04/06/2016  . Chronic pain syndrome     Left arm and leg s/p traumatic injury   . Chronic renal insufficiency    . Coronary artery disease     25% LAD stenosis on cath 2007.  Stable angina.  . Diverticulosis    . Essential hypertension    . Gastroesophageal reflux disease    . Gout    . Hyperlipidemia LDL goal < 100    . Mild carpal tunnel syndrome of right wrist 01/24/2016   Mild degree electrically per EMG 01/24/2016   . Morbid obesity with BMI of 40.0-44.9, adult ([place])    . Normocytic anemia    . Obstructive sleep apnea     Moderate, AHI 29.8 per hour with moderately loud snoring and oxygen desaturation to a nadir of 79%. CPAP titration resulted in a prescription for 17 CWP.    [name] Open-angle glaucoma    . Osteoarthritis cervical spine    . Osteoarthritis of left knee 06/19/2013   Tricompartmental disease.  Treated with double hinged upright knee brace, steroid/xylocaine knee injections, and NSAIDs   . Paroxysmal atrial fibrillation ([place]) 10/25/2015  . Persistent atrial fibrillation ([place])    . Right rotator cuff tear     Large full-thickness  tear of the supraspinatus with mild retraction but no atrophy   . Subclinical hypothyroidism    . 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 ([facility])      Prior to Admission medications   Medication Sig Start Date End Date Taking? Authorizing Provider  allopurinol (ZYLOPRIM) 300 MG tablet Take 1 tablet (300 mg total) by mouth daily. 05/24/16  Yes Oval Linsey, MD  atorvastatin (LIPITOR) 10 MG tablet Take 1 tablet (10 mg total) by mouth daily. 06/10/15  Yes Oval Linsey, MD  Blood Glucose Monitoring Suppl (ONETOUCH VERIO) w/Device KIT 1 each by Does not apply route daily. 11/04/15  Yes Loleta Chance, MD  esomeprazole (NEXIUM) 40 MG capsule Take 1 capsule (40 mg total) by mouth daily. 05/08/16  Yes Oval Linsey, MD  flecainide (TAMBOCOR) 100  MG tablet Take 1 tablet (100 mg total) by mouth every 12 (twelve) hours. 12/23/15  Yes Oval Linsey, MD  glipiZIDE (GLUCOTROL) 5 MG tablet Take 1 tablet (5 mg total) by mouth daily before breakfast. 12/23/15  Yes Oval Linsey, MD  glucose blood [facility] VERIO) test strip Use as instructed 05/24/16  Yes Oval Linsey, MD  latanoprost (XALATAN) 0.005 % ophthalmic solution Place 1 drop into both eyes at bedtime. 03/09/16  Yes Oval Linsey, MD  magnesium oxide (MAG-OX) 400 MG tablet Take 1 tablet (400 mg total) by mouth daily. 05/24/16  Yes Oval Linsey, MD  metFORMIN (GLUCOPHAGE) 1000 MG tablet Take 1 tablet (1,000 mg total) by mouth 2 (two) times daily with a meal. 12/23/15  Yes Oval Linsey, MD  metoprolol tartrate (LOPRESSOR) 25 MG tablet Take 1 tablet (25 mg total) by mouth 2 (two) times daily. 04/06/16  Yes Oval Linsey, MD  morphine (MS CONTIN) 30 MG 12 hr tablet Take 1 tablet (30 mg total) by mouth every 12 (twelve) hours. 05/24/16  Yes Oval Linsey, MD  nitroGLYCERIN (NITROSTAT) 0.4 MG SL tablet Place 1 tablet (0.4 mg total) under the tongue every 5 (five) minutes as needed for chest pain. 06/10/15  Yes Oval Linsey, MD  [facility] DELICA LANCETS 16X MISC Use 1 strip daily 05/24/16  Yes Oval Linsey, MD  oxyCODONE-acetaminophen (PERCOCET) 10-325 MG tablet Take 1 tablet by mouth every 6 (six) hours as needed for pain. 05/24/16  Yes Oval Linsey, MD  sorbitol 70 % solution TAKE 15-60 ML BY MOUTH DAILY AS NEEDED (MIX IN COFFEE, JUICE, OR WATER). 04/27/16  Yes Oval Linsey, MD  terazosin (HYTRIN) 5 MG capsule Take 1 capsule (5 mg total) by mouth at bedtime. 05/03/16  Yes Oval Linsey, MD  torsemide [facility]) 20 MG tablet Take 2 tablets three times weekly 12/23/15  Yes Oval Linsey, MD  valsartan (DIOVAN) 160 MG tablet Take 1 tablet (160 mg total) by mouth daily. 09/09/15  Yes Oval Linsey, MD  warfarin (COUMADIN) 5 MG tablet TAKE AS DIRECTED BY COUMADIN CLINIC 04/06/16  Yes Burnell Blanks, MD    Allergies  Allergen Reactions  . Ramipril Swelling  . Testosterone Rash    Social History  Substance Use Topics  . Smoking status: Never Smoker  . Smokeless tobacco: Never Used  . Alcohol use Yes    Family History  Problem Relation Age of Onset  . Heart failure Mother   . Alzheimer's disease Father   . Heart failure Brother   . Osteoarthritis Brother   . Prostate cancer Brother   . Early death Brother     Manufacturing systems engineer  . Heart attack Neg Hx   .  Stroke Neg Hx   . Hypertension Sister   . Hypertension Brother     Review of Systems  Constitutional: negative for fevers and chills Gastrointestinal: negative for diarrhea Musculoskeletal: positive for myalgias and arthralgias, negative for bone pain All other systems reviewed and are negative   Constitutional: in no apparent distress  Vitals:   05/31/16 0853  BP: 127/81  Pulse: (!) 55  Temp: 97.5 F (36.4 C)   EYES: anicteric ENMT: Cardiovascular: Cor RRR Respiratory: CTA B; normal respiratory effort GI: Bowel sounds are normal, liver is not enlarged, spleen is not enlarged Musculoskeletal: no pedal edema noted; left leg with extensive scarring, well-healed, no sinus tracts, no erythema, no warmth Skin: negatives: no rash Hematologic: no cervical lad  Labs: Lab Results  Component Value Date   WBC 7.1 10/25/2015   HGB 13.9 10/25/2015   HCT 42.8 10/25/2015   MCV 89.4 10/25/2015   PLT 165 10/25/2015    Lab Results  Component Value Date   CREATININE 1.03 04/06/2016   BUN 23 04/06/2016   NA 143 04/06/2016   K 5.1 04/06/2016   CL 105 04/06/2016   CO2 20 04/06/2016    Lab Results  Component Value Date   ALT 29 04/06/2016   AST 28 04/06/2016   ALKPHOS 92 04/06/2016   BILITOT <0.2 04/06/2016   INR 3.8 05/10/2016     Assessment: chronic bone changes from previous accident.  In review of the history and xrays, this is not consistent with osteomyelitis.  He has had no progression of signs  of infection, including no clinical signs of infection with sinus tract development, no point tenderness and in review of the xrays, there has been no progression of bone remodeling area as would be expected with an active infection.  Though the CRP is elevated, this is non-specific and is known to be elevated in some on chronic opioids, obesity and additionally the ESR is only 37.    Plan: 1) continue with evaluation via orthopedics 2) no indication from an ID standpoint to delay surgery, if indicated

## 2016-06-01 ENCOUNTER — Encounter: Payer: Self-pay | Admitting: Internal Medicine

## 2016-06-01 ENCOUNTER — Ambulatory Visit (INDEPENDENT_AMBULATORY_CARE_PROVIDER_SITE_OTHER): Payer: Medicare Other | Admitting: Internal Medicine

## 2016-06-01 ENCOUNTER — Ambulatory Visit (INDEPENDENT_AMBULATORY_CARE_PROVIDER_SITE_OTHER): Payer: Medicare Other | Admitting: *Deleted

## 2016-06-01 VITALS — BP 120/64 | HR 67 | Ht 72.0 in | Wt 282.1 lb

## 2016-06-01 DIAGNOSIS — I5030 Unspecified diastolic (congestive) heart failure: Secondary | ICD-10-CM

## 2016-06-01 DIAGNOSIS — I480 Paroxysmal atrial fibrillation: Secondary | ICD-10-CM | POA: Diagnosis not present

## 2016-06-01 DIAGNOSIS — Z7901 Long term (current) use of anticoagulants: Secondary | ICD-10-CM | POA: Diagnosis not present

## 2016-06-01 DIAGNOSIS — I25119 Atherosclerotic heart disease of native coronary artery with unspecified angina pectoris: Secondary | ICD-10-CM

## 2016-06-01 DIAGNOSIS — I4891 Unspecified atrial fibrillation: Secondary | ICD-10-CM | POA: Diagnosis not present

## 2016-06-01 DIAGNOSIS — Z5181 Encounter for therapeutic drug level monitoring: Secondary | ICD-10-CM

## 2016-06-01 LAB — POCT INR: INR: 3.1

## 2016-06-01 NOTE — Progress Notes (Signed)
Patient Care Team: Oval Linsey, MD as PCP - General (Internal Medicine) Clent Jacks, MD as Consulting Physician (Ophthalmology)   HPI  Juan Calderon is a 60 y.o. male Seen in follow-up for atrial fibrillation. He was started on flecainide. He has been holding sinus rhythm.  He has not had any atrial fibrillation of which he is aware.  He has chronic shortness of breath which is gradually worsening. He has had some peripheral edema. He denies chest pain.  He has a history of sleep apnea, but they continue to work on a CPAP system which he can tolerate  Echocardiogram 5/16-EF 55-60% with an LA dimension of 48.    Records and Results Reviewed As above    Past Medical History:  Diagnosis Date  . Alcohol abuse    . C6 radiculopathy 01/24/2016   Right upper extremity, mild to moderate electrically by EMG on 01/24/2016  . Cataract    Left eye  . Chronic diastolic heart failure ([place])     with mild left ventricular hypertrophy on Echo 02/2010  . Chronic obstructive pulmonary disease ([place])    . Chronic osteomyelitis of femur ([place]) 04/06/2016  . Chronic pain syndrome     Left arm and leg s/p traumatic injury   . Chronic renal insufficiency    . Coronary artery disease     25% LAD stenosis on cath 2007.  Stable angina.  . Diverticulosis    . Essential hypertension    . Gastroesophageal reflux disease    . Gout    . Hyperlipidemia LDL goal < 100    . Mild carpal tunnel syndrome of right wrist 01/24/2016   Mild degree electrically per EMG 01/24/2016   . Morbid obesity with BMI of 40.0-44.9, adult ([place])    . Normocytic anemia    . Obstructive sleep apnea     Moderate, AHI 29.8 per hour with moderately loud snoring and oxygen desaturation to a nadir of 79%. CPAP titration resulted in a prescription for 17 CWP.    [name] Open-angle glaucoma    . Osteoarthritis cervical spine    . Osteoarthritis of left knee 06/19/2013   Tricompartmental disease.  Treated with double hinged  upright knee brace, steroid/xylocaine knee injections, and NSAIDs   . Paroxysmal atrial fibrillation ([place]) 10/25/2015  . Persistent atrial fibrillation ([place])    . Right rotator cuff tear     Large full-thickness tear of the supraspinatus with mild retraction but no atrophy   . Subclinical hypothyroidism    . 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 [facility])      Past Surgical History:  Procedure Laterality Date  . CARDIOVERSION N/A 12/30/2014   Procedure: CARDIOVERSION;  Surgeon: [name], MD;  Location: [facility] ENDOSCOPY;  Service: Cardiovascular;  Laterality: N/A;  . FRACTURE SURGERY Left 1980's   Elbow  . Left arm surgery    . Left leg surgery    . SHOULDER SURGERY     Right    Current Outpatient Prescriptions  Medication Sig Dispense Refill  . allopurinol (ZYLOPRIM) 300 MG tablet Take 1 tablet (300 mg total) by mouth daily. 90 tablet 3  . atorvastatin (LIPITOR) 10 MG tablet Take 1 tablet (10 mg total) by mouth daily. 90 tablet 3  . Blood Glucose Monitoring Suppl (ONETOUCH VERIO) w/Device KIT 1 each by Does not apply route daily. 1 kit 0  . esomeprazole (NEXIUM) 40 MG capsule Take 1 capsule (40 mg total) by mouth  daily. 90 capsule 3  . flecainide (TAMBOCOR) 100 MG tablet Take 1 tablet (100 mg total) by mouth every 12 (twelve) hours. 180 tablet 3  . glipiZIDE (GLUCOTROL) 5 MG tablet Take 1 tablet (5 mg total) by mouth daily before breakfast. 90 tablet 3  . glucose blood (ONETOUCH VERIO) test strip Use as instructed 100 each 12  . latanoprost (XALATAN) 0.005 % ophthalmic solution Place 1 drop into both eyes at bedtime. 7.5 mL 0  . magnesium oxide (MAG-OX) 400 MG tablet Take 1 tablet (400 mg total) by mouth daily. 90 tablet 3  . metFORMIN (GLUCOPHAGE) 1000 MG tablet Take 1 tablet (1,000 mg total) by mouth 2 (two) times daily with a meal. 180 tablet 3  . metoprolol tartrate (LOPRESSOR) 25 MG tablet Take 1 tablet (25 mg total) by mouth 2 (two) times daily.  180 tablet 3  . morphine (MS CONTIN) 30 MG 12 hr tablet Take 1 tablet (30 mg total) by mouth every 12 (twelve) hours. 60 tablet 0  . nitroGLYCERIN (NITROSTAT) 0.4 MG SL tablet Place 1 tablet (0.4 mg total) under the tongue every 5 (five) minutes as needed for chest pain. 30 tablet 11  . ONETOUCH DELICA LANCETS 31V MISC Use 1 strip daily 100 each 5  . oxyCODONE-acetaminophen (PERCOCET) 10-325 MG tablet Take 1 tablet by mouth every 6 (six) hours as needed for pain. 109 tablet 0  . sorbitol 70 % solution TAKE 15-60 ML BY MOUTH DAILY AS NEEDED (MIX IN COFFEE, JUICE, OR WATER). 473 mL 6  . terazosin (HYTRIN) 5 MG capsule Take 1 capsule (5 mg total) by mouth at bedtime. 90 capsule 3  . torsemide (DEMADEX) 20 MG tablet Take 2 tablets three times weekly 72 tablet 3  . valsartan (DIOVAN) 160 MG tablet Take 1 tablet (160 mg total) by mouth daily. 90 tablet 3  . warfarin (COUMADIN) 5 MG tablet TAKE AS DIRECTED BY COUMADIN CLINIC 30 tablet 3   No current facility-administered medications for this visit.     Allergies  Allergen Reactions  . Ramipril Swelling  . Testosterone Rash      Review of Systems negative except from HPI and PMH  Physical Exam BP 120/64   Pulse 67   Ht 6' (1.829 m)   Wt 282 lb 1.9 oz (128 kg)   SpO2 94%   BMI 38.26 kg/m  Well developed and well nourished in no acute distress HENT normal E scleral and icterus clear Neck Supple JVP not assessed as he is sitting up  Regular rate and rhythm, no murmurs gallops or rub Soft with active bowel sounds No clubbing cyanosis 1+ Edema Alert and oriented, grossly normal motor and sensory function;  Very abnormal gait x decades Skin Warm and Dry  ECG demonstrates atrial  rhythm at 55 -interval 19/10/46 Axis 41+ nonspecific T-wave changes  Assessment and  Plan  Atrial fibrillation paroxysmal   HFpEF  Morbid obesity  History of acute kidney injury question mechanism  Obstructive sleep apnea  He has been intolerant  of CPAP unfortunately. They are working on  an oral appliance  Continue diuretics  We will undertake a trial for a few weeks of exclusion of his metoprolol and then have him come back and walk around the office to see whether we are able to generate a sinus rate faster than the 64 we were able to do today. Not withstanding the limiting of his exercise tolerance by his leg injury, he is still quite dyspneic.  I  wonder however, if his heart rate is so limiting, whether he might not benefit from pacing perhaps with a mixed sensor

## 2016-06-01 NOTE — Patient Instructions (Signed)
Medication Instructions: Your physician has recommended you make the following change in your medication:   1. STOP Lopressor (Metoprolol Tartrate)   Labwork: None Ordered  Procedures/Testing: None Ordered  Follow-Up: Your physician recommends that you schedule a follow-up appointment in: 4-6 weeks with Chanetta Marshall, NP.    Any Additional Special Instructions Will Be Listed Below (If Applicable).     If you need a refill on your cardiac medications before your next appointment, please call your pharmacy.

## 2016-06-11 ENCOUNTER — Telehealth: Payer: Self-pay | Admitting: *Deleted

## 2016-06-11 NOTE — Telephone Encounter (Signed)
Pt calls and states his  Morphine script needs to be rewritten, it is wrong, he states the dates are wrong on the script, could you please call the pt and speak with him, he is a little irritated

## 2016-06-12 NOTE — Telephone Encounter (Signed)
Call from patient stating that he had been unable to fill his ms contin dated for fill after 05/28/2016.  Pharmacy was contacted and they actually had "looked over" the rx .  They apologized for the error and will fill rx for patient.  Pt aware, phone call complete.[name], Gurpreet Mariani Cassady10/10/20175:00 PM

## 2016-06-15 ENCOUNTER — Ambulatory Visit (INDEPENDENT_AMBULATORY_CARE_PROVIDER_SITE_OTHER): Payer: Medicare Other

## 2016-06-15 DIAGNOSIS — I4891 Unspecified atrial fibrillation: Secondary | ICD-10-CM | POA: Diagnosis not present

## 2016-06-15 DIAGNOSIS — Z5181 Encounter for therapeutic drug level monitoring: Secondary | ICD-10-CM

## 2016-06-15 DIAGNOSIS — Z7901 Long term (current) use of anticoagulants: Secondary | ICD-10-CM | POA: Diagnosis not present

## 2016-06-15 LAB — POCT INR: INR: 3.2

## 2016-06-16 ENCOUNTER — Other Ambulatory Visit: Payer: Self-pay | Admitting: Internal Medicine

## 2016-06-16 DIAGNOSIS — I25119 Atherosclerotic heart disease of native coronary artery with unspecified angina pectoris: Secondary | ICD-10-CM

## 2016-06-29 ENCOUNTER — Encounter (HOSPITAL_COMMUNITY): Payer: Self-pay | Admitting: Emergency Medicine

## 2016-06-29 ENCOUNTER — Other Ambulatory Visit: Payer: Self-pay

## 2016-06-29 ENCOUNTER — Emergency Department (HOSPITAL_COMMUNITY): Payer: Medicare Other

## 2016-06-29 ENCOUNTER — Ambulatory Visit (INDEPENDENT_AMBULATORY_CARE_PROVIDER_SITE_OTHER): Payer: Medicare Other | Admitting: Pharmacist

## 2016-06-29 ENCOUNTER — Encounter: Payer: Self-pay | Admitting: Internal Medicine

## 2016-06-29 ENCOUNTER — Ambulatory Visit: Payer: Medicare Other | Admitting: Internal Medicine

## 2016-06-29 ENCOUNTER — Ambulatory Visit (INDEPENDENT_AMBULATORY_CARE_PROVIDER_SITE_OTHER): Payer: Medicare Other | Admitting: Internal Medicine

## 2016-06-29 ENCOUNTER — Emergency Department (HOSPITAL_COMMUNITY)
Admission: EM | Admit: 2016-06-29 | Discharge: 2016-06-29 | Disposition: A | Discharge status: Home / Self Care | Payer: Medicare Other | Attending: Emergency Medicine | Admitting: Emergency Medicine

## 2016-06-29 ENCOUNTER — Ambulatory Visit (INDEPENDENT_AMBULATORY_CARE_PROVIDER_SITE_OTHER): Payer: Medicare Other | Admitting: *Deleted

## 2016-06-29 VITALS — BP 108/74 | Temp 97.6°F | Wt 282.7 lb

## 2016-06-29 VITALS — BP 110/60 | HR 91

## 2016-06-29 DIAGNOSIS — R079 Chest pain, unspecified: Secondary | ICD-10-CM | POA: Diagnosis not present

## 2016-06-29 DIAGNOSIS — I872 Venous insufficiency (chronic) (peripheral): Secondary | ICD-10-CM

## 2016-06-29 DIAGNOSIS — Z5181 Encounter for therapeutic drug level monitoring: Secondary | ICD-10-CM

## 2016-06-29 DIAGNOSIS — J449 Chronic obstructive pulmonary disease, unspecified: Secondary | ICD-10-CM | POA: Insufficient documentation

## 2016-06-29 DIAGNOSIS — N521 Erectile dysfunction due to diseases classified elsewhere: Secondary | ICD-10-CM

## 2016-06-29 DIAGNOSIS — R0789 Other chest pain: Secondary | ICD-10-CM | POA: Diagnosis not present

## 2016-06-29 DIAGNOSIS — E1140 Type 2 diabetes mellitus with diabetic neuropathy, unspecified: Secondary | ICD-10-CM | POA: Diagnosis not present

## 2016-06-29 DIAGNOSIS — I10 Essential (primary) hypertension: Secondary | ICD-10-CM

## 2016-06-29 DIAGNOSIS — I130 Hypertensive heart and chronic kidney disease with heart failure and stage 1 through stage 4 chronic kidney disease, or unspecified chronic kidney disease: Secondary | ICD-10-CM | POA: Diagnosis not present

## 2016-06-29 DIAGNOSIS — Z79891 Long term (current) use of opiate analgesic: Secondary | ICD-10-CM | POA: Diagnosis not present

## 2016-06-29 DIAGNOSIS — R0602 Shortness of breath: Secondary | ICD-10-CM | POA: Diagnosis not present

## 2016-06-29 DIAGNOSIS — N189 Chronic kidney disease, unspecified: Secondary | ICD-10-CM | POA: Insufficient documentation

## 2016-06-29 DIAGNOSIS — E119 Type 2 diabetes mellitus without complications: Secondary | ICD-10-CM | POA: Diagnosis not present

## 2016-06-29 DIAGNOSIS — Z7901 Long term (current) use of anticoagulants: Secondary | ICD-10-CM | POA: Insufficient documentation

## 2016-06-29 DIAGNOSIS — Z7984 Long term (current) use of oral hypoglycemic drugs: Secondary | ICD-10-CM | POA: Insufficient documentation

## 2016-06-29 DIAGNOSIS — G4733 Obstructive sleep apnea (adult) (pediatric): Secondary | ICD-10-CM | POA: Diagnosis not present

## 2016-06-29 DIAGNOSIS — I110 Hypertensive heart disease with heart failure: Secondary | ICD-10-CM | POA: Diagnosis not present

## 2016-06-29 DIAGNOSIS — E785 Hyperlipidemia, unspecified: Secondary | ICD-10-CM | POA: Diagnosis not present

## 2016-06-29 DIAGNOSIS — Z79899 Other long term (current) drug therapy: Secondary | ICD-10-CM

## 2016-06-29 DIAGNOSIS — G894 Chronic pain syndrome: Secondary | ICD-10-CM | POA: Diagnosis not present

## 2016-06-29 DIAGNOSIS — D649 Anemia, unspecified: Secondary | ICD-10-CM

## 2016-06-29 DIAGNOSIS — E669 Obesity, unspecified: Secondary | ICD-10-CM | POA: Diagnosis not present

## 2016-06-29 DIAGNOSIS — E7849 Other hyperlipidemia: Secondary | ICD-10-CM

## 2016-06-29 DIAGNOSIS — I25119 Atherosclerotic heart disease of native coronary artery with unspecified angina pectoris: Secondary | ICD-10-CM

## 2016-06-29 DIAGNOSIS — I5032 Chronic diastolic (congestive) heart failure: Secondary | ICD-10-CM | POA: Insufficient documentation

## 2016-06-29 DIAGNOSIS — Z6838 Body mass index (BMI) 38.0-38.9, adult: Secondary | ICD-10-CM | POA: Diagnosis not present

## 2016-06-29 DIAGNOSIS — I2510 Atherosclerotic heart disease of native coronary artery without angina pectoris: Secondary | ICD-10-CM

## 2016-06-29 DIAGNOSIS — L97929 Non-pressure chronic ulcer of unspecified part of left lower leg with unspecified severity: Secondary | ICD-10-CM

## 2016-06-29 DIAGNOSIS — I83029 Varicose veins of left lower extremity with ulcer of unspecified site: Secondary | ICD-10-CM

## 2016-06-29 LAB — CBC
HEMATOCRIT: 33.7 % — AB (ref 39.0–52.0)
HEMOGLOBIN: 10.8 g/dL — AB (ref 13.0–17.0)
MCH: 28.7 pg (ref 26.0–34.0)
MCHC: 32 g/dL (ref 30.0–36.0)
MCV: 89.6 fL (ref 78.0–100.0)
Platelets: 220 10*3/uL (ref 150–400)
RBC: 3.76 MIL/uL — ABNORMAL LOW (ref 4.22–5.81)
RDW: 14.6 % (ref 11.5–15.5)
WBC: 7.5 10*3/uL (ref 4.0–10.5)

## 2016-06-29 LAB — PROTIME-INR
INR: 2.31
Prothrombin Time: 25.8 seconds — ABNORMAL HIGH (ref 11.4–15.2)

## 2016-06-29 LAB — BASIC METABOLIC PANEL
ANION GAP: 12 (ref 5–15)
BUN: 14 mg/dL (ref 6–20)
CALCIUM: 7.7 mg/dL — AB (ref 8.9–10.3)
CHLORIDE: 101 mmol/L (ref 101–111)
CO2: 26 mmol/L (ref 22–32)
Creatinine, Ser: 1.2 mg/dL (ref 0.61–1.24)
GFR calc non Af Amer: >60 mL/min (ref >60)
Glucose, Bld: 67 mg/dL (ref 65–99)
POTASSIUM: 3.6 mmol/L (ref 3.5–5.1)
Sodium: 139 mmol/L (ref 135–145)

## 2016-06-29 LAB — I-STAT TROPONIN, ED: TROPONIN I, POC: 0 ng/mL (ref 0.00–0.08)

## 2016-06-29 LAB — POCT INR: INR: 2.6

## 2016-06-29 LAB — POCT GLYCOSYLATED HEMOGLOBIN (HGB A1C): HEMOGLOBIN A1C: 5.9

## 2016-06-29 LAB — GLUCOSE, CAPILLARY: Glucose-Capillary: 85 mg/dL (ref 65–99)

## 2016-06-29 MED ORDER — ATORVASTATIN CALCIUM 20 MG PO TABS: 20.0000 mg | ORAL_TABLET | Freq: Every day | ORAL | 3 refills | Status: DC

## 2016-06-29 MED ORDER — METOPROLOL TARTRATE 25 MG PO TABS: 25.0000 mg | ORAL_TABLET | Freq: Two times a day (BID) | ORAL | 3 refills | Status: DC

## 2016-06-29 MED ORDER — OXYCODONE-ACETAMINOPHEN 10-325 MG PO TABS: 1.0000 | ORAL_TABLET | Freq: Four times a day (QID) | ORAL | 0 refills | Status: DC | PRN

## 2016-06-29 MED ORDER — MORPHINE SULFATE ER 30 MG PO TBCR: 30.0000 mg | EXTENDED_RELEASE_TABLET | Freq: Two times a day (BID) | ORAL | 0 refills | Status: DC

## 2016-06-29 NOTE — ED Notes (Signed)
Placed patient into a gown and on the monitor did ekg shown to Dr Lacinda Axon

## 2016-06-29 NOTE — Assessment & Plan Note (Signed)
Assessment  He has not followed up to have his oral appliance manufactured because he currently does not have the money to do so.  Plan  He was encouraged to have the oral appliance manufactured as soon as he has the money to afford it. We will reassess whether or not this was accomplished at the follow-up visit.

## 2016-06-29 NOTE — Assessment & Plan Note (Signed)
Assessment  He has lost 10 pounds in the last 5 weeks. He states he has been doing nothing different. That being said, he was praised for maintaining a lifestyle that allowed continued weight loss and for remaining compliant with his chronic diastolic heart failure regimen.  Plan  He was encouraged to continue with his current lifestyle with regards to portion sizes and dietary choices. We will reassess his weight at the follow-up visit. If we are able to continue with further weight loss we may be able to peel back some of his hypoglycemics, antihypertensives, and improve his arthritic symptoms.

## 2016-06-29 NOTE — ED Triage Notes (Signed)
Pt in from [place] office after developing cp while at rest. Pain is central, substernal 8/10 and sharp at worst. Per EMS, pain was 4/10 on their arrival. No NTG or ASA given. Pt awake, a&ox4, denies sob, n/v/d or dizziness.

## 2016-06-29 NOTE — Assessment & Plan Note (Signed)
Assessment  His diabetes remains under excellent control on glipizide 5 mg each morning and metoprolol 1000 mg by mouth twice daily. He denies any hypoglycemic symptoms. His hemoglobin A1c today was 5.9 up from 5.73 months ago.  Plan  We will continue the glipizide at 5 mg by mouth every morning and metoprolol at 1000 mg by mouth twice daily since he is tolerating this well. We will reassess his glycemic control at the follow-up visit with a repeat hemoglobin A1c. He states he has had an eye exam within the last year and we will attempt to get those records. A repeat diabetic foot exam was done today in order for him to qualify for diabetic shoes. With regards to his erectile dysfunction we are continuing aggressive risk factor modification.

## 2016-06-29 NOTE — Patient Instructions (Addendum)
It was good to see you again.  I am very sorry you needed to come in just to have your feet examined for the shoes.  Great job with the diabetes!  1) Keep taking the medications as you are.  I increased your atorvastatin dose to 20 mg daily to get you to the high potency level.  2)  I gave you another set of prescriptions for your pain medications.  This will get you through mid-January.  4) Work on getting your oral appliance when you have the money to afford it.  5) We measured your leg to help with the paperwork for the left compression stocking.  6) I will see you back in 3 months, sooner if necessary.

## 2016-06-29 NOTE — Addendum Note (Signed)
Addended by: Oval Linsey D on: 06/29/2016 03:56 PM   Modules accepted: Orders

## 2016-06-29 NOTE — Assessment & Plan Note (Signed)
Assessment  He continues to have chronic posttraumatic venous insufficiency of the left lower extremity and continues to await the new compression stockings at the higher dose. We made further measurements of his leg for him in order to help him complete the paperwork necessary to get this compression stocking. Once he receives it he was encouraged to use it.  Plan  We will reassess the chronic venous insufficiency of the left lower extremity at the return visit after he purchases the compression stocking that his thigh-high.

## 2016-06-29 NOTE — Assessment & Plan Note (Signed)
Assessment  His chronic pain, mostly secondary to his left lower extremity injury in 1979, with subsequent arthritic changes, remains unchanged on the Percocet 10-325 mg 1 tablet every 6 hours as needed for pain and MS Contin 15 mg by mouth twice daily. He is starting the MS Contin at 30 mg by mouth twice daily to getting on Monday.  Plan  We will continue the very slow wean of the Percocet down to a target number of 90 tablets per month. We will reassess his pain on the as needed Percocet and the increased dose of the MS Contin 30 mg by mouth twice daily which he plans on starting on Monday.

## 2016-06-29 NOTE — Progress Notes (Signed)
Patient ID: Juan Calderon, male   DOB: October 30, 1955, 60 y.o.   MRN: [id number]  After leaving the clinic Mr. Maheu went to his anticoagulation clinic appointment and while getting his POC INR developed a sharp chest pain under his bilateral nipples that radiated up the sternum.  The pain lasted minutes and resolved by the time he arrived at the ED.  The pain was not associated with dizziness, diaphoresis, SOB (above baseline), or nausea.  When asked about recent chest pain he did confide that he had a couple of episodes of similar pain last week while at rest that spontaneously resolved within a few minutes.  They were so slight he had not thought anything of it.  At this point he feels fine and is without acute complaints.  Physical Examination  Unchanged from this morning's examination.  In addition: CV: RRR w/o murmurs, rubs, or gallops although the heart sounds were distant to my ear. Chest Wall: Chest pain non-reproducible with palpation of the chest.  CBC: Hct 33 (down from 42 8 months ago).  MCV stable in upper 80's. Trop: 0.00 INR 2.1 BMP: Unremarkable  ECG: Sinus rhythm at 84 bpm, normal axis, prolonged QTC (540), no significant Q waves, no LVH by voltage, good R wave progression, if used T wave flattening with T-wave inversions laterally in a strain pattern, unchanged from the previous ECG on 06/01/2016.  Impression  Atypical chest pain in a man with diabetes, obesity, and known nonobstructive coronary artery disease. This pain could represent anginal pain given his history and risk factors. Interestingly, if it is anginal pain, it may be related to the recent stopping of his beta blocker. Prior to this he was not experiencing any pain suggesting the antianginal regimen may have been effective.  The cause of the drop in his hemoglobin is unclear at this time. We have no signs or symptoms to suggest an acute GI blood loss and his MCV is stable which would go against chronic blood  losses resulting in an iron deficiency. His last colonoscopy was 06/02/2009 and was only remarkable for left-sided diverticulosis.  Plan  He was restarted on the metoprolol at 25 mg by mouth twice daily. He was also reminded he had sublingual nitroglycerin should the pain recur. He will be sent home from the emergency department today given our assessment and reinitiation of his antianginal regimen. [facility] next week to assess the efficacy of this regimen. He was advised to return to the emergency department over the weekend should the pain recur after restarting the metoprolol which he has at home. At that point we would be more than happy to admit him to the hospital to experdiate further workup.  For his anemia we will repeat the CBC at the follow-up visit, obtain a ferritin, and evaluate a urinalysis. The esomeprazole 40 mg by mouth daily will be continued. He has another 3 years before a repeat screening colonoscopy is needed. If this evaluation is unremarkable and there is concerned that this could represent GI blood loss we may repeat refer him for an earlier study, especially if he is found to have iron deficiency.

## 2016-06-29 NOTE — Assessment & Plan Note (Signed)
Assessment  He is tolerating the atorvastatin 10 mg by mouth each evening without myalgias.  Plan  He and I decided to increase the atorvastatin to 20 mg by mouth each evening to fall within the range of a high intensity statin. We will reassess for evidence of intolerances to this dose increase at the return visit.

## 2016-06-29 NOTE — Discharge Instructions (Signed)
Please take your metoprolol 25 mg twice a day. Follow-up your primary care doctor.

## 2016-06-29 NOTE — Progress Notes (Signed)
1.) Reason for visit: EKG- add on from CVRR for chest pain  2.) Name of MD requesting visit: Turner (DOD)  3.) H&P: Patient with a history of a-fib managed by Dr. [name], was in the office today for a scheduled coumadin appointment. Triage was notified by Fuller Canada, Pharm D that the patient was complaining of chest pain on arrival to the office today- she reviewed with Dr. Radford Pax and an EKG was requested to be done.   4.) ROS related to problem: The patient was able to ambulate down the hall from CVRR to a patient room. He reported sharp chest pain under the right and left breast areas as well as mid-sternal. This did not seem to worsen with ambulation per the patient. He had reports of chest pin 2 days ago, pain did resolve on its own, but he is not sure how long this lasted. He is SOB, but states "I am always SOB." He denies nausea/ radiating pain/ no diaphoresis.   5.) Assessment and plan per MD: EKG obtained- HR 91- NSR, but with mild EKG changes. Reviewed by Dr. Radford Pax (DOD) and she recommended EMS transport to [facility]. The patient was placed on O2- 2 L nasal canula. EMS was notified and the patient was transported to [facility]. He did appear stable at transport. The patient's sister, Vickii Chafe, was notified of transport per the patient's request.

## 2016-06-29 NOTE — Progress Notes (Signed)
   Subjective:    Patient ID: Juan Calderon, male    DOB: [birth year], 60 y.o.   MRN: [id number]  HPI  Juan Calderon is here for follow-up of his chronic diastolic heart failure, diabetes, and chronic pain. Apparently, the impedus for this visit was the need to have a formal diabetic foot examination in order to get his diabetic shoes.  It had to be with in the last 6 months and my last formal diabetic foot examination was just over 6 months ago.  Please see the A&P for the status of the pt's chronic medical problems.  Review of Systems  Constitutional: Negative for activity change and unexpected weight change.  Respiratory: Positive for shortness of breath. Negative for chest tightness.        His dyspnea is unchanged from baseline, even after stopping the metoprolol.  Cardiovascular: Positive for leg swelling. Negative for chest pain and palpitations.       Left lower extremity swelling at baseline.  Musculoskeletal: Positive for arthralgias, back pain, gait problem and neck pain.  Neurological: Negative for dizziness and light-headedness.      Objective:   Physical Exam  Constitutional: He is oriented to person, place, and time. He appears well-developed and well-nourished. No distress.  HENT:  Head: Normocephalic and atraumatic.  Eyes: Conjunctivae are normal. Right eye exhibits no discharge. Left eye exhibits no discharge. No scleral icterus.  Musculoskeletal: Normal range of motion. He exhibits deformity. He exhibits no edema.  Neurological: He is alert and oriented to person, place, and time. He exhibits normal muscle tone.  Skin: Skin is warm and dry. No rash noted. He is not diaphoretic. No erythema.  Psychiatric: He has a normal mood and affect. His behavior is normal. Judgment and thought content normal.  Nursing note and vitals reviewed.     Assessment & Plan:   Please see problem oriented charting.

## 2016-06-29 NOTE — Assessment & Plan Note (Signed)
Assessment  He continues to have chronic dyspnea that is unchanged from baseline. This is despite valsartan 160 mg by mouth daily and torsemide 20 mg by mouth 3 times a week. When last seen by the electrophysiologist the metoprolol was discontinued as the negative chronotropic effects were felt to be contributing to the dyspnea. Unfortunately, despite stopping the metoprolol, his dyspnea remains unchanged. I do not believe this is secondary to volume overload as his weight is down 10 pounds since he was last seen. It is currently 282 when his baseline was initially felt to be 289. Thus, I am unsure as to why he continues to have his chronic dyspnea outside of his underlying poor conditioning, class II obesity, and continued underlying chronic diastolic dysfunction.  Plan  He was continued on the torsemide 20 mg by mouth 3 times a week and valsartan 160 mg by mouth daily. We will continue to hold the beta blocker given the concern for its negative chronotropic effects. He was encouraged to continue his efforts at weight loss as this may help with his chronic diastolic heart failure. We will reassess his symptoms, afterload, and weight at the follow-up visit.

## 2016-06-29 NOTE — ED Notes (Signed)
Patient Alert and oriented X4. Stable and ambulatory. Patient verbalized understanding of the discharge instructions.  Patient belongings were taken by the patient.  

## 2016-06-29 NOTE — ED Provider Notes (Signed)
[place] DEPT Provider Note   CSN: [id number] Arrival date & time: 06/29/16  1324     History   Chief Complaint Chief Complaint  Patient presents with  . Chest Pain    HPI Juan Calderon is a 60 y.o. male.  Chest pain 2 days described as sharp without dyspnea, diaphoresis, nausea. Patient was at the primary care doctor's office this morning. He has known CAD along with obesity, hyperlipidemia, COPD, many others.  He has recently been off from his metoprolol.    Past Medical History:  Diagnosis Date  . Alcohol abuse    . C6 radiculopathy 01/24/2016   Right upper extremity, mild to moderate electrically by EMG on 01/24/2016  . Cataract    Left eye  . Chronic diastolic heart failure ([place])     with mild left ventricular hypertrophy on Echo 02/2010  . Chronic obstructive pulmonary disease ([place])    . Chronic osteomyelitis of femur ([place]) 04/06/2016  . Chronic pain syndrome     Left arm and leg s/p traumatic injury   . Chronic renal insufficiency    . Coronary artery disease     25% LAD stenosis on cath 2007.  Stable angina.  . Diverticulosis    . Essential hypertension    . Gastroesophageal reflux disease    . Gout    . Hyperlipidemia LDL goal < 100    . Mild carpal tunnel syndrome of right wrist 01/24/2016   Mild degree electrically per EMG 01/24/2016   . Morbid obesity with BMI of 40.0-44.9, adult ([place])    . Normocytic anemia    . Obstructive sleep apnea     Moderate, AHI 29.8 per hour with moderately loud snoring and oxygen desaturation to a nadir of 79%. CPAP titration resulted in a prescription for 17 CWP.    [name] Open-angle glaucoma    . Osteoarthritis cervical spine    . Osteoarthritis of left knee 06/19/2013   Tricompartmental disease.  Treated with double hinged upright knee brace, steroid/xylocaine knee injections, and NSAIDs   . Paroxysmal atrial fibrillation ([place]) 10/25/2015  . Persistent atrial fibrillation ([place])    . Right rotator cuff tear     Large  full-thickness tear of the supraspinatus with mild retraction but no atrophy   . Subclinical hypothyroidism    . 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 [facility])      Patient Active Problem List   Diagnosis Date Noted  . Mild carpal tunnel syndrome of right wrist 01/24/2016  . C6 radiculopathy 01/24/2016  . Paroxysmal atrial fibrillation ([place]) 10/25/2015  . Alcohol abuse 02/28/2015  . Constipation due to opioid therapy 12/24/2014  . Venous stasis ulcer of left lower extremity ([place]) 08/06/2014  . Encounter for therapeutic drug monitoring 11/16/2013  . Diverticulosis 11/12/2013  . Post-traumatic osteoarthritis of left knee 06/19/2013  . Obstructive sleep apnea 06/01/2013  . Osteoarthritis cervical spine 04/25/2013  . Gastroesophageal reflux disease without esophagitis 04/25/2013  . Vasomotor rhinitis 04/25/2013  . Open-angle glaucoma 04/25/2013  . Hyperlipidemia 04/25/2013  . 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 ([place]) 04/25/2013  . Coronary artery disease involving native coronary artery with angina pectoris ([place]) 04/25/2013  . Chronic asthmatic bronchitis ([place]) 04/25/2013  . Idiopathic chronic gout without tophus 04/25/2013  . Obesity, Class II, BMI 35-39.9 04/25/2013  . Right rotator cuff tear 04/25/2013  . Healthcare maintenance 01/15/2013  . Chronic pain syndrome 01/15/2013  . Chronic diastolic heart failure ([place]) 02/04/2012  .  Essential hypertension 09/20/2011    Past Surgical History:  Procedure Laterality Date  . CARDIOVERSION N/A 12/30/2014   Procedure: CARDIOVERSION;  Surgeon: [name], MD;  Location: [facility] ENDOSCOPY;  Service: Cardiovascular;  Laterality: N/A;  . FRACTURE SURGERY Left 1980's   Elbow  . Left arm surgery    . Left leg surgery    . SHOULDER SURGERY     Right       Home Medications    Prior to Admission medications   Medication Sig Start Date End Date Taking? Authorizing Provider    allopurinol (ZYLOPRIM) 300 MG tablet Take 1 tablet (300 mg total) by mouth daily. 05/24/16  Yes Oval Linsey, MD  atorvastatin (LIPITOR) 20 MG tablet Take 1 tablet (20 mg total) by mouth daily. 06/29/16  Yes Oval Linsey, MD  Blood Glucose Monitoring Suppl (ONETOUCH VERIO) w/Device KIT 1 each by Does not apply route daily. 11/04/15  Yes Loleta Chance, MD  esomeprazole (NEXIUM) 40 MG capsule Take 1 capsule (40 mg total) by mouth daily. 05/08/16  Yes Oval Linsey, MD  flecainide (TAMBOCOR) 100 MG tablet Take 1 tablet (100 mg total) by mouth every 12 (twelve) hours. 12/23/15  Yes Oval Linsey, MD  glipiZIDE (GLUCOTROL) 5 MG tablet Take 1 tablet (5 mg total) by mouth daily before breakfast. 12/23/15  Yes Oval Linsey, MD  glucose blood [facility] VERIO) test strip Use as instructed 05/24/16  Yes Oval Linsey, MD  latanoprost (XALATAN) 0.005 % ophthalmic solution Place 1 drop into both eyes at bedtime. 03/09/16  Yes Oval Linsey, MD  magnesium oxide (MAG-OX) 400 MG tablet Take 1 tablet (400 mg total) by mouth daily. 05/24/16  Yes Oval Linsey, MD  metFORMIN (GLUCOPHAGE) 1000 MG tablet Take 1 tablet (1,000 mg total) by mouth 2 (two) times daily with a meal. 12/23/15  Yes Oval Linsey, MD  morphine (MS CONTIN) 30 MG 12 hr tablet Take 1 tablet (30 mg total) by mouth every 12 (twelve) hours. 06/29/16  Yes Oval Linsey, MD  [facility] DELICA LANCETS 44I MISC Use 1 strip daily 05/24/16  Yes Oval Linsey, MD  oxyCODONE-acetaminophen (PERCOCET) 10-325 MG tablet Take 1 tablet by mouth every 6 (six) hours as needed for pain. 06/29/16  Yes Oval Linsey, MD  terazosin (HYTRIN) 5 MG capsule Take 1 capsule (5 mg total) by mouth at bedtime. 05/03/16  Yes Oval Linsey, MD  torsemide [facility]) 20 MG tablet Take 2 tablets three times weekly 12/23/15  Yes Oval Linsey, MD  valsartan (DIOVAN) 160 MG tablet Take 1 tablet (160 mg total) by mouth daily. 09/09/15  Yes Oval Linsey, MD  warfarin (COUMADIN) 5 MG tablet  Take 2.5-5 mg by mouth daily at 6 PM. Patient takes 5 mg daily except on  Monday, Wednesday, Friday patient takes 2.5 mg   Yes Historical Provider, MD  nitroGLYCERIN (NITROSTAT) 0.4 MG SL tablet Place 1 tablet (0.4 mg total) under the tongue every 5 (five) minutes as needed for chest pain. 06/10/15   Oval Linsey, MD  sorbitol 70 % solution TAKE 15-60 ML BY MOUTH DAILY AS NEEDED (MIX IN COFFEE, JUICE, OR WATER). Patient not taking: Reported on 06/29/2016 04/27/16   Oval Linsey, MD  warfarin (COUMADIN) 5 MG tablet TAKE AS DIRECTED BY COUMADIN CLINIC 04/06/16   Burnell Blanks, MD    Family History Family History  Problem Relation Age of Onset  . Heart failure Mother   . Alzheimer's disease Father   . Heart failure Brother   . Osteoarthritis Brother   .  Prostate cancer Brother   . Early death Brother     Manufacturing systems engineer  . Hypertension Sister   . Hypertension Brother   . Heart attack Neg Hx   . Stroke Neg Hx     Social History Social History  Substance Use Topics  . Smoking status: Never Smoker  . Smokeless tobacco: Never Used  . Alcohol use Yes     Allergies   Ramipril and Testosterone   Review of Systems Review of Systems  All other systems reviewed and are negative.    Physical Exam Updated Vital Signs BP 116/72   Pulse 80   Temp 98.2 F (36.8 C) (Oral)   Resp 16   Ht 6' (1.829 m)   Wt 282 lb (127.9 kg)   SpO2 98%   BMI 38.25 kg/m   Physical Exam  Constitutional: He is oriented to person, place, and time. He appears well-developed and well-nourished.  Obese;  NAD  HENT:  Head: Normocephalic and atraumatic.  Eyes: Conjunctivae are normal.  Neck: Neck supple.  Cardiovascular: Normal rate and regular rhythm.   Pulmonary/Chest: Effort normal and breath sounds normal.  Abdominal: Soft. Bowel sounds are normal.  Musculoskeletal: Normal range of motion.  Neurological: He is alert and oriented to person, place, and time.  Skin: Skin is warm and dry.    Psychiatric: He has a normal mood and affect. His behavior is normal.  Nursing note and vitals reviewed.    ED Treatments / Results  Labs (all labs ordered are listed, but only abnormal results are displayed) Labs Reviewed  CBC - Abnormal; Notable for the following:       Result Value   RBC 3.76 (*)    Hemoglobin 10.8 (*)    HCT 33.7 (*)    All other components within normal limits  BASIC METABOLIC PANEL - Abnormal; Notable for the following:    Calcium 7.7 (*)    All other components within normal limits  PROTIME-INR - Abnormal; Notable for the following:    Prothrombin Time 25.8 (*)    All other components within normal limits  I-STAT TROPOININ, ED    EKG  EKG Interpretation  Date/Time:  Friday June 29 2016 13:29:23 EDT Ventricular Rate:  84 PR Interval:    QRS Duration: 91 QT Interval:  456 QTC Calculation: 540 R Axis:   49 Text Interpretation:  Sinus rhythm Atrial premature complex Borderline T abnormalities, anterior leads Prolonged QT interval Confirmed by Lacinda Axon  MD, Sallye Lunz ([id number]) on 06/29/2016 2:58:28 PM       Radiology Dg Chest Port 1 View  Result Date: 06/29/2016 CLINICAL DATA:  Chest pain short of breath EXAM: PORTABLE CHEST 1 VIEW COMPARISON:  10/25/2015 FINDINGS: Cardiac enlargement. Pulmonary vascular congestion without edema or effusion. Negative for pneumonia. IMPRESSION: Cardiac enlargement with pulmonary vascular congestion compatible with mild fluid overload. Negative for edema. Electronically Signed   By: Franchot Gallo M.D.   On: 06/29/2016 14:49    Procedures Procedures (including critical care time)  Medications Ordered in ED Medications - No data to display   Initial Impression / Assessment and Plan / ED Course  I have reviewed the triage vital signs and the nursing notes.  Pertinent labs & imaging results that were available during my care of the patient were reviewed by me and considered in my medical decision making (see chart for  details).  Clinical Course    Patient presents with chest pain. Pain is now relieved. He was seen by  his primary care doctor this morning. Consult obtained from internal medicine. Calcium noted to be subtherapeutic. He will restart his metoprolol 25 mg twice a day and follow-up with primary care next week.  Final Clinical Impressions(s) / ED Diagnoses   Final diagnoses:  Chest pain, unspecified type    New Prescriptions New Prescriptions   No medications on file     Nat Christen, MD 06/29/16 1525

## 2016-06-29 NOTE — Assessment & Plan Note (Signed)
Assessment  His blood pressure today remains well controlled at 108/66. This is despite the recent discontinuance of the metoprolol. His current regimen includes valsartan 160 mg by mouth daily and terazosin 5 mg by mouth each night.  Plan  We will continue the valsartan at 160 mg by mouth daily and terazosin at 5 mg by mouth each night. We will reassess the blood pressure at the follow-up visit.

## 2016-06-30 ENCOUNTER — Other Ambulatory Visit: Payer: Self-pay | Admitting: Internal Medicine

## 2016-06-30 DIAGNOSIS — H4010X0 Unspecified open-angle glaucoma, stage unspecified: Secondary | ICD-10-CM

## 2016-07-02 ENCOUNTER — Ambulatory Visit: Payer: Medicare Other

## 2016-07-02 ENCOUNTER — Telehealth: Payer: Self-pay | Admitting: Internal Medicine

## 2016-07-02 NOTE — Telephone Encounter (Signed)
APT. REMINDER CALL, NO ANSWER, NO VOICEMAIL °

## 2016-07-02 NOTE — Progress Notes (Signed)
As a matter of clarification I am managing Juan Calderon' diabetes and we have instituted a comprehensive management plan.  He does have post traumatic chronic venous insufficiency of the left lower extremity.  This has led to chronic left foot swelling and he requires special shoes because of this deformity in the setting of his diabetes.  Please see the diabetic foot examination section from this visit for the specifics of the examination.

## 2016-07-03 ENCOUNTER — Ambulatory Visit (INDEPENDENT_AMBULATORY_CARE_PROVIDER_SITE_OTHER): Payer: Medicare Other | Admitting: Internal Medicine

## 2016-07-03 DIAGNOSIS — Z79899 Other long term (current) drug therapy: Secondary | ICD-10-CM

## 2016-07-03 DIAGNOSIS — E119 Type 2 diabetes mellitus without complications: Secondary | ICD-10-CM

## 2016-07-03 DIAGNOSIS — D649 Anemia, unspecified: Secondary | ICD-10-CM

## 2016-07-03 DIAGNOSIS — Z5189 Encounter for other specified aftercare: Secondary | ICD-10-CM | POA: Diagnosis not present

## 2016-07-03 DIAGNOSIS — I10 Essential (primary) hypertension: Secondary | ICD-10-CM

## 2016-07-03 NOTE — Patient Instructions (Addendum)
Juan Calderon it was nice meeting you today.  -I have ordered some labs and will call you when I get the results back.   -You have an appointment with Dr. Eppie Gibson on 09/07/2016 at 10:15 am.

## 2016-07-04 LAB — URINALYSIS, COMPLETE
Bilirubin, UA: NEGATIVE
Glucose, UA: NEGATIVE
Ketones, UA: NEGATIVE
LEUKOCYTES UA: NEGATIVE
Nitrite, UA: NEGATIVE
PH UA: 5.5 (ref 5.0–7.5)
Protein, UA: NEGATIVE
RBC UA: NEGATIVE
Specific Gravity, UA: 1.016 (ref 1.005–1.030)
UUROB: 0.2 mg/dL (ref 0.2–1.0)

## 2016-07-04 LAB — CBC
Hematocrit: 31.9 % — ABNORMAL LOW (ref 37.5–51.0)
Hemoglobin: 10.5 g/dL — ABNORMAL LOW (ref 12.6–17.7)
MCH: 28.5 pg (ref 26.6–33.0)
MCHC: 32.9 g/dL (ref 31.5–35.7)
MCV: 86 fL (ref 79–97)
PLATELETS: 253 10*3/uL (ref 150–379)
RBC: 3.69 x10E6/uL — AB (ref 4.14–5.80)
RDW: 15.3 % (ref 12.3–15.4)
WBC: 7.5 10*3/uL (ref 3.4–10.8)

## 2016-07-04 LAB — CALCIUM, IONIZED: Calcium, Ionized, Serum: 4.1 mg/dL — ABNORMAL LOW (ref 4.5–5.6)

## 2016-07-04 LAB — MICROSCOPIC EXAMINATION: BACTERIA UA: NONE SEEN

## 2016-07-04 LAB — MAGNESIUM: MAGNESIUM: 1.1 mg/dL — AB (ref 1.6–2.3)

## 2016-07-04 LAB — FERRITIN: FERRITIN: 594 ng/mL — AB (ref 30–400)

## 2016-07-04 MED ORDER — MAGNESIUM OXIDE 400 MG PO TABS: 400.0000 mg | ORAL_TABLET | Freq: Two times a day (BID) | ORAL | 1 refills | Status: DC

## 2016-07-04 NOTE — Assessment & Plan Note (Signed)
A Patient is currently taking valsartan 160 mg daily, terazosin 5 mg daily, and metoprolol 25 mg twice daily. BP 139/73 and P 62 at this visit.  P -Continue current mgmt

## 2016-07-04 NOTE — Assessment & Plan Note (Addendum)
A Labs showing Hgb 10.8 with normal MCV. Ferritin high at 594. UA negative for hematuria. Likely anemia of chronic disease in the setting of DM2. Chart review shows patient has been anemic since 2015 and ferritin was previously high (>1000) in 2014 and 2016.   P -CTM

## 2016-07-04 NOTE — Progress Notes (Signed)
   CC: Patient is here for an ED f/u of hypocalcemia.   HPI:  [name] is a 60 y.o. M with a PMHx of conditions listed below presenting to the clinic for an ED f/u of hypocalcemia. Please see problem based charting for the status of the patient's current and chronic medical conditions.   Past Medical History:  Diagnosis Date  . Alcohol abuse    . C6 radiculopathy 01/24/2016   Right upper extremity, mild to moderate electrically by EMG on 01/24/2016  . Cataract    Left eye  . Chronic diastolic heart failure ([place])     with mild left ventricular hypertrophy on Echo 02/2010  . Chronic obstructive pulmonary disease ([place])    . Chronic osteomyelitis of femur ([place]) 04/06/2016  . Chronic pain syndrome     Left arm and leg s/p traumatic injury   . Chronic renal insufficiency    . Coronary artery disease     25% LAD stenosis on cath 2007.  Stable angina.  . Diverticulosis    . Essential hypertension    . Gastroesophageal reflux disease    . Gout    . Hyperlipidemia LDL goal < 100    . Mild carpal tunnel syndrome of right wrist 01/24/2016   Mild degree electrically per EMG 01/24/2016   . Morbid obesity with BMI of 40.0-44.9, adult ([place])    . Normocytic anemia    . Obstructive sleep apnea     Moderate, AHI 29.8 per hour with moderately loud snoring and oxygen desaturation to a nadir of 79%. CPAP titration resulted in a prescription for 17 CWP.    [name] Open-angle glaucoma    . Osteoarthritis cervical spine    . Osteoarthritis of left knee 06/19/2013   Tricompartmental disease.  Treated with double hinged upright knee brace, steroid/xylocaine knee injections, and NSAIDs   . Paroxysmal atrial fibrillation ([place]) 10/25/2015  . Persistent atrial fibrillation ([place])    . Right rotator cuff tear     Large full-thickness tear of the supraspinatus with mild retraction but no atrophy   . Subclinical hypothyroidism    . 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 ([facility])       Review of Systems:  Pertinent positives mentioned in HPI. Remainder of all ROS negative.   Physical Exam:  Vitals:   07/03/16 0946  BP: 139/73  Pulse: 62  Temp: 97.5 F (36.4 C)  SpO2: 100%  Weight: 286 lb 6.4 oz (129.9 kg)  Height: 6' (1.829 m)   Physical Exam  Constitutional: He is oriented to person, place, and time. No distress.  HENT:  Mouth/Throat: Oropharynx is clear and moist.  Eyes: EOM are normal.  Neck: Neck supple. No tracheal deviation present.  Cardiovascular: Normal rate, regular rhythm and intact distal pulses.   Pulmonary/Chest: Effort normal and breath sounds normal. No respiratory distress.  Abdominal: Soft. Bowel sounds are normal. He exhibits no distension. There is no tenderness.  Musculoskeletal: Normal range of motion.  Neurological: He is alert and oriented to person, place, and time.  Skin: Skin is warm and dry.    Assessment & Plan:   See Encounters Tab for problem based charting.  Patient discussed with Dr. Angelia Mould

## 2016-07-04 NOTE — Assessment & Plan Note (Deleted)
BP 139/73. Currently taking valsartan 160 mg daily, terazosin 5 mg daily, and metoprolol 25 mg twice daily.  -Continue current mgmt

## 2016-07-04 NOTE — Assessment & Plan Note (Addendum)
A Patient was recently in the ED for CP and found to be hypocalcemic. He denies having any CP, SOB, or palpitations at present. Ionized calcium checked at this visit low at 4.1. Reports taking his magnesium supplement daily, however, Mag level checked at this visit low at 1.1. He is currently on a PPI which could be the source of his hypomagnesemia.   P -Increase dose of Magnesium oxide to 400 mg BID (recommended dose for pts on antacids)   Addendum 07/05/16: Not able to reach the patient over the phone. Will try again.

## 2016-07-06 ENCOUNTER — Encounter: Payer: Self-pay | Admitting: Dietician

## 2016-07-06 ENCOUNTER — Telehealth: Payer: Self-pay | Admitting: *Deleted

## 2016-07-06 NOTE — Telephone Encounter (Signed)
Call to patient that his Magnesium level is low and that he will need to increase his dosing of Magnesium per order of Dr. Marlowe Sax.  Patient was informed of and voiced understanding of the plan to pick up the new prescription at the [facility] on [address].  To be sent a list of Magnesium rich foods per D. [place] Nutritionist.  Sander Nephew, RN 07/06/2016 1:58 PM

## 2016-07-06 NOTE — Progress Notes (Signed)
Patient with low magnesium being treated with supplementation. He agreed to be mailed a list of high magnesium foods. A list was mailed to him and also encouraged him to follow up for Medical Nutrition therapy

## 2016-07-09 NOTE — Progress Notes (Signed)
Internal Medicine Clinic Attending  Case discussed with Dr. Rathoreat the time of the visit. We reviewed the resident's history and exam and pertinent patient test results. I agree with the assessment, diagnosis, and plan of care documented in the resident's note.  

## 2016-07-15 NOTE — Progress Notes (Addendum)
Cardiology Office Note Date:  07/16/2016  Patient ID:  Juan Calderon 1956/06/14, MRN [id number] PCP:  Karren Cobble, MD  Cardiologist:  Dr. Julianne Handler Electrophysiologist: Dr. [name]   Chief Complaint: planned f/u  History of Present Illness: Juan Calderon is a 60 y.o. male with history of chronic CHF (Diastolic), CAD (non-obstructive by cath in 2007), persistent AFib, COPD, HTN, HLD, gout, GERD, DM  March 2016 and had c/o dyspnea on exertion. Stress myoview 11/25/14 with no ischemia. 24 hour monitor showed atrial fib with bradycardia (rates as low as 40 bpm), frequent PVCs (9000 in 24 hours) and non-sustained VT. He was seen by Dr. [name] 12/23/14 and was started on Flecainide. He underwent DCCV on 12/30/14. His Lasix was changed to Torsemide. He was admitted to [facility] June 2016 with renal failure felt to be due to over-diuresis with torsemide. Echo May 2016 with LVEF=55-60%. He was seen by Dr. [name] August 2016 and his Lasix was restarted. He has been continued on Flecainide  He comes in today to be seen for Dr. [name], last seen by him in Sept, at that time with some increased SOB/edema, he stopped his BB perhaps with some concern of chronotropic incompintence? Mentioning pacing as a possibility.    The patient was seen in the ER 10/27 with c/o CP, he states started while at the coumadin check and resolved by the time he reached the ER.  Described as sharp b/l chest at the lower rib borders, not radiating, and no associated symptoms.  He was resumed on his metoprolol and has remained CP free. Had a Trop that was 0.00 and no EKG changes. We discussed at length his c/o SOB. His meds reviewed on his torsemide 3x week.  His cxr at the ER had some congestion, no edema.  He tells me that his SOB is chronic now for many years, not changed or escalating, no rest SOB, no nighttime symptoms.  He has COPD he was never a smoke but has second hand exposure which unfortunately still does.  He denies  feeling like he has LE swelling or bloating/water retention.  He denies dizziness, near syncope or syncope.  No bleeding or signs of bleeding reported.  AFib Hx: DCCV 2016 AAD: Flecainide, 2016 Hx of digoxin stopped with rates 30's and BB was reduced 2016 Intolerant of CPAP  Past Medical History:  Diagnosis Date  . Alcohol abuse    . C6 radiculopathy 01/24/2016   Right upper extremity, mild to moderate electrically by EMG on 01/24/2016  . Cataract    Left eye  . Chronic diastolic heart failure ([place])     with mild left ventricular hypertrophy on Echo 02/2010  . Chronic obstructive pulmonary disease ([place])    . Chronic osteomyelitis of femur ([place]) 04/06/2016  . Chronic pain syndrome     Left arm and leg s/p traumatic injury   . Chronic renal insufficiency    . Coronary artery disease     25% LAD stenosis on cath 2007.  Stable angina.  . Diverticulosis    . Essential hypertension    . Gastroesophageal reflux disease    . Gout    . Hyperlipidemia LDL goal < 100    . Mild carpal tunnel syndrome of right wrist 01/24/2016   Mild degree electrically per EMG 01/24/2016   . Morbid obesity with BMI of 40.0-44.9, adult ([place])    . Normocytic anemia    . Obstructive sleep apnea     Moderate, AHI  29.8 per hour with moderately loud snoring and oxygen desaturation to a nadir of 79%. CPAP titration resulted in a prescription for 17 CWP.    [name] Open-angle glaucoma    . Osteoarthritis cervical spine    . Osteoarthritis of left knee 06/19/2013   Tricompartmental disease.  Treated with double hinged upright knee brace, steroid/xylocaine knee injections, and NSAIDs   . Paroxysmal atrial fibrillation ([place]) 10/25/2015  . Persistent atrial fibrillation ([place])    . Right rotator cuff tear     Large full-thickness tear of the supraspinatus with mild retraction but no atrophy   . Subclinical hypothyroidism    . 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 [facility])      Past Surgical  History:  Procedure Laterality Date  . CARDIOVERSION N/A 12/30/2014   Procedure: CARDIOVERSION;  Surgeon: [name], MD;  Location: [facility] ENDOSCOPY;  Service: Cardiovascular;  Laterality: N/A;  . FRACTURE SURGERY Left 1980's   Elbow  . Left arm surgery    . Left leg surgery    . SHOULDER SURGERY     Right    Current Outpatient Prescriptions  Medication Sig Dispense Refill  . allopurinol (ZYLOPRIM) 300 MG tablet Take 1 tablet (300 mg total) by mouth daily. 90 tablet 3  . atorvastatin (LIPITOR) 20 MG tablet Take 1 tablet (20 mg total) by mouth daily. 90 tablet 3  . Blood Glucose Monitoring Suppl (ONETOUCH VERIO) w/Device KIT 1 each by Does not apply route daily. 1 kit 0  . esomeprazole (NEXIUM) 40 MG capsule Take 1 capsule (40 mg total) by mouth daily. 90 capsule 3  . flecainide (TAMBOCOR) 100 MG tablet Take 1 tablet (100 mg total) by mouth every 12 (twelve) hours. 180 tablet 3  . glipiZIDE (GLUCOTROL) 5 MG tablet Take 1 tablet (5 mg total) by mouth daily before breakfast. 90 tablet 3  . glucose blood (ONETOUCH VERIO) test strip Use as instructed 100 each 12  . latanoprost (XALATAN) 0.005 % ophthalmic solution Place 1 drop into both eyes at bedtime. 7.5 mL 0  . magnesium oxide (MAG-OX) 400 MG tablet Take 1 tablet (400 mg total) by mouth 2 (two) times daily. 180 tablet 1  . metFORMIN (GLUCOPHAGE) 1000 MG tablet Take 1 tablet (1,000 mg total) by mouth 2 (two) times daily with a meal. 180 tablet 3  . metoprolol tartrate (LOPRESSOR) 25 MG tablet Take 1 tablet (25 mg total) by mouth 2 (two) times daily. 180 tablet 3  . morphine (MS CONTIN) 30 MG 12 hr tablet Take 1 tablet (30 mg total) by mouth every 12 (twelve) hours. 60 tablet 0  . nitroGLYCERIN (NITROSTAT) 0.4 MG SL tablet Place 1 tablet (0.4 mg total) under the tongue every 5 (five) minutes as needed for chest pain. 30 tablet 11  . ONETOUCH DELICA LANCETS 12X MISC Use 1 strip daily 100 each 5  . oxyCODONE-acetaminophen (PERCOCET) 10-325 MG  tablet Take 1 tablet by mouth every 6 (six) hours as needed for pain. 108 tablet 0  . sorbitol 70 % solution TAKE 15-60 ML BY MOUTH DAILY AS NEEDED (MIX IN COFFEE, JUICE, OR WATER). 473 mL 6  . terazosin (HYTRIN) 5 MG capsule Take 1 capsule (5 mg total) by mouth at bedtime. 90 capsule 3  . torsemide (DEMADEX) 20 MG tablet Take 2 tablets three times weekly 72 tablet 3  . valsartan (DIOVAN) 160 MG tablet Take 1 tablet (160 mg total) by mouth daily. 90 tablet 3  . warfarin (COUMADIN)  5 MG tablet TAKE AS DIRECTED BY COUMADIN CLINIC 30 tablet 3  . warfarin (COUMADIN) 5 MG tablet Take 2.5-5 mg by mouth daily at 6 PM. Patient takes 5 mg daily except on  Monday, Wednesday, Friday patient takes 2.5 mg     No current facility-administered medications for this visit.     Allergies:   Ramipril and Testosterone   Social History:  The patient  reports that he has never smoked. He has never used smokeless tobacco. He reports that he drinks alcohol. He reports that he does not use drugs.   Family History:  The patient's family history includes Alzheimer's disease in his father; Early death in his brother; Heart failure in his brother and mother; Hypertension in his brother and sister; Osteoarthritis in his brother; Prostate cancer in his brother.  ROS:  Please see the history of present illness.  All other systems are reviewed and otherwise negative.   PHYSICAL EXAM:  VS:  BP 122/74   Pulse 60   Ht 6' (1.829 m)   Wt 280 lb (127 kg)   BMI 37.97 kg/m  BMI: Body mass index is 37.97 kg/m. Well nourished, well developed, in no acute distress  HEENT: normocephalic, atraumatic  Neck: no JVD, carotid bruits or masses Cardiac:  RRR; no significant murmurs, no rubs, or gallops Lungs:  clear to auscultation bilaterally, no wheezing, rhonchi or rales  Abd: soft, nontender MS: no deformity or atrophy Ext:  no pitting edema is noted, he wears a compression stocking LLE since an accident/reconstructive sx many  years ago Skin: warm and dry, no rash Neuro:  No gross deficits appreciated Psych: euthymic mood, full affect   EKG:  Done today (after walking in from outside) and reviewed by myself shows SR, 60bpm, QRS 199m, PR 1728m QTc 45246mnonspecific T changes appear unchanged from prior EKGs   Stress myoview 11/25/14: Impression Exercise Capacity: Lexiscan with no exercise. BP Response: Normal blood pressure response. Clinical Symptoms: There is dyspnea and chest pressure ECG Impression: No significant ST segment change suggestive of ischemia. Comparison with Prior Nuclear Study: Compared to 05/10/12, no change. Overall Impression: Normal stress nuclear study. LV Ejection Fraction:Study not gated. . LV Wall Motion: Study not gated due to atrial fibrillation; there appears to be significant LVE.  Echo May 2016: Left ventricle: LVEF is approximately 55 to 60% The cavity size was normal. Wall thickness was increased in a pattern of mild LVH. - Pulmonary arteries: PA peak pressure: 39 mm Hg (S).  Recent Labs: 10/25/2015: B Natriuretic Peptide 51.9; TSH 2.492 04/06/2016: ALT 29 06/29/2016: BUN 14; Creatinine, Ser 1.20; Hemoglobin 10.8; Potassium 3.6; Sodium 139 07/03/2016: Magnesium 1.1; Platelets 253  No results found for requested labs within last 8760 hours.   Estimated Creatinine Clearance: 87.9 mL/min (by C-G formula based on SCr of 1.2 mg/dL).   Wt Readings from Last 3 Encounters:  07/16/16 280 lb (127 kg)  07/03/16 286 lb 6.4 oz (129.9 kg)  06/29/16 282 lb (127.9 kg)     Other studies reviewed: Additional studies/records reviewed today include: summarized above  ASSESSMENT AND PLAN:  1. Paroxysmal AFib     SR today     CHA2DS2Vasc is at least 1, on warfarin     Flecainide/Metoprolol     EKG/QRS today is stable  2. Diastolic CHF, chronic SOB      Exam is euvolemic, weight is down     discussed daily weights  3. HTN     stable  4.  CP     Unchanged EKG, not  recurrent, negative ischemic w/u a year ago   ADDEND: will have him wear a 48hour holter monitor to evaluate HR variability, the patient will be called to get scheduled.  Disposition: F/u with Dr. [name] in 4 months, sooner if needed  Current medicines are reviewed at length with the patient today.  The patient did not have any concerns regarding medicines.  Juan Lasso, PA-C 07/16/2016 2:25 PM     [address]  [place] [address] [phone] (office)  [phone] (fax)

## 2016-07-16 ENCOUNTER — Encounter: Payer: Self-pay | Admitting: Physician Assistant

## 2016-07-16 ENCOUNTER — Ambulatory Visit (INDEPENDENT_AMBULATORY_CARE_PROVIDER_SITE_OTHER): Payer: Medicare Other | Admitting: Physician Assistant

## 2016-07-16 ENCOUNTER — Ambulatory Visit (INDEPENDENT_AMBULATORY_CARE_PROVIDER_SITE_OTHER): Payer: Medicare Other | Admitting: *Deleted

## 2016-07-16 VITALS — BP 122/74 | HR 60 | Ht 72.0 in | Wt 280.0 lb

## 2016-07-16 DIAGNOSIS — I5032 Chronic diastolic (congestive) heart failure: Secondary | ICD-10-CM | POA: Diagnosis not present

## 2016-07-16 DIAGNOSIS — Z5181 Encounter for therapeutic drug level monitoring: Secondary | ICD-10-CM

## 2016-07-16 DIAGNOSIS — I25119 Atherosclerotic heart disease of native coronary artery with unspecified angina pectoris: Secondary | ICD-10-CM

## 2016-07-16 DIAGNOSIS — Z79899 Other long term (current) drug therapy: Secondary | ICD-10-CM

## 2016-07-16 DIAGNOSIS — R079 Chest pain, unspecified: Secondary | ICD-10-CM

## 2016-07-16 DIAGNOSIS — I10 Essential (primary) hypertension: Secondary | ICD-10-CM

## 2016-07-16 DIAGNOSIS — R0602 Shortness of breath: Secondary | ICD-10-CM

## 2016-07-16 DIAGNOSIS — I480 Paroxysmal atrial fibrillation: Secondary | ICD-10-CM

## 2016-07-16 LAB — POCT INR: INR: 2.7

## 2016-07-16 NOTE — Patient Instructions (Addendum)
Medication Instructions:   Your physician recommends that you continue on your current medications as directed. Please refer to the Current Medication list given to you today.   If you need a refill on your cardiac medications before your next appointment, please call your pharmacy.  Labwork: NONE ORDERED  TODAY    Testing/Procedures: NONE ORDERED  TODAY    Follow-Up:  4 MONTHS WITH DR [name]    Any Other Special Instructions Will Be Listed Below (If Applicable).

## 2016-07-18 ENCOUNTER — Encounter: Payer: Self-pay | Admitting: *Deleted

## 2016-07-18 NOTE — Addendum Note (Signed)
Addended by: Claude Manges on: 07/18/2016 02:16 PM   Modules accepted: Orders

## 2016-07-20 ENCOUNTER — Telehealth: Payer: Self-pay | Admitting: Physician Assistant

## 2016-07-20 NOTE — Telephone Encounter (Signed)
The patient had called to discuss why he was ordered for a heart monitor.  Icalled the patient back to discuss evaluation into his HR response with exertion/daily activities to see if lack of HR response may be the cause of his SOB (in further review of Dr. Olin Pia notes/thought for possible chronotropic incompetence).  The patient again though feels like this is a chronic problem for him, and at his baseline and would like to hold off any further w/u for now and keep his 4 month visit with Dr. [name].  Tommye Standard, PA-C

## 2016-08-10 ENCOUNTER — Ambulatory Visit: Payer: Medicare Other | Admitting: Internal Medicine

## 2016-08-13 DIAGNOSIS — M170 Bilateral primary osteoarthritis of knee: Secondary | ICD-10-CM | POA: Diagnosis not present

## 2016-08-18 ENCOUNTER — Other Ambulatory Visit: Payer: Self-pay | Admitting: Cardiovascular Disease

## 2016-08-18 DIAGNOSIS — I4891 Unspecified atrial fibrillation: Secondary | ICD-10-CM

## 2016-08-22 ENCOUNTER — Other Ambulatory Visit: Payer: Self-pay | Admitting: Internal Medicine

## 2016-08-22 DIAGNOSIS — I5032 Chronic diastolic (congestive) heart failure: Secondary | ICD-10-CM

## 2016-09-07 ENCOUNTER — Encounter: Payer: Self-pay | Admitting: Internal Medicine

## 2016-09-07 ENCOUNTER — Ambulatory Visit (INDEPENDENT_AMBULATORY_CARE_PROVIDER_SITE_OTHER): Payer: Medicare Other | Admitting: Internal Medicine

## 2016-09-07 VITALS — BP 148/75 | HR 57 | Temp 98.2°F | Wt 289.3 lb

## 2016-09-07 DIAGNOSIS — I110 Hypertensive heart disease with heart failure: Secondary | ICD-10-CM | POA: Diagnosis not present

## 2016-09-07 DIAGNOSIS — T402X5A Adverse effect of other opioids, initial encounter: Secondary | ICD-10-CM | POA: Diagnosis not present

## 2016-09-07 DIAGNOSIS — Z7901 Long term (current) use of anticoagulants: Secondary | ICD-10-CM | POA: Diagnosis not present

## 2016-09-07 DIAGNOSIS — G894 Chronic pain syndrome: Secondary | ICD-10-CM

## 2016-09-07 DIAGNOSIS — I25119 Atherosclerotic heart disease of native coronary artery with unspecified angina pectoris: Secondary | ICD-10-CM

## 2016-09-07 DIAGNOSIS — Z79899 Other long term (current) drug therapy: Secondary | ICD-10-CM | POA: Diagnosis not present

## 2016-09-07 DIAGNOSIS — Z79891 Long term (current) use of opiate analgesic: Secondary | ICD-10-CM

## 2016-09-07 DIAGNOSIS — E7849 Other hyperlipidemia: Secondary | ICD-10-CM

## 2016-09-07 DIAGNOSIS — E785 Hyperlipidemia, unspecified: Secondary | ICD-10-CM

## 2016-09-07 DIAGNOSIS — N521 Erectile dysfunction due to diseases classified elsewhere: Secondary | ICD-10-CM

## 2016-09-07 DIAGNOSIS — E1149 Type 2 diabetes mellitus with other diabetic neurological complication: Secondary | ICD-10-CM | POA: Diagnosis not present

## 2016-09-07 DIAGNOSIS — Z7984 Long term (current) use of oral hypoglycemic drugs: Secondary | ICD-10-CM

## 2016-09-07 DIAGNOSIS — E1140 Type 2 diabetes mellitus with diabetic neuropathy, unspecified: Secondary | ICD-10-CM

## 2016-09-07 DIAGNOSIS — K5903 Drug induced constipation: Secondary | ICD-10-CM

## 2016-09-07 DIAGNOSIS — I5032 Chronic diastolic (congestive) heart failure: Secondary | ICD-10-CM

## 2016-09-07 DIAGNOSIS — G4733 Obstructive sleep apnea (adult) (pediatric): Secondary | ICD-10-CM

## 2016-09-07 DIAGNOSIS — I10 Essential (primary) hypertension: Secondary | ICD-10-CM

## 2016-09-07 HISTORY — DX: Long term (current) use of opiate analgesic: Z79.891

## 2016-09-07 LAB — POCT GLYCOSYLATED HEMOGLOBIN (HGB A1C): Hemoglobin A1C: 5.4

## 2016-09-07 LAB — GLUCOSE, CAPILLARY: Glucose-Capillary: 80 mg/dL (ref 65–99)

## 2016-09-07 MED ORDER — OXYCODONE-ACETAMINOPHEN 10-325 MG PO TABS: 1.0000 | ORAL_TABLET | Freq: Four times a day (QID) | ORAL | 0 refills | Status: DC | PRN

## 2016-09-07 NOTE — Assessment & Plan Note (Signed)
Assessment  His constipation is relatively well controlled symptomatically on the sorbitol 70%.  Plan  Continue sorbitol 70% as needed and reassess the efficacy of this therapy at the follow-up visit.

## 2016-09-07 NOTE — Assessment & Plan Note (Signed)
Assessment  His blood pressure was slightly above target today 148/75. This is an isolated elevation compared to the most recent values he has had. I am unsure as to the cause of this isolated elevation. His current regimen includes valsartan 160 mg by mouth daily, metoprolol 25 mg by mouth twice daily, and terazosin 5 mg by mouth nightly.  Plan  We will continue the valsartan 160 mg by mouth daily, metoprolol 25 mg by mouth twice daily, and terazosin 5 mg by mouth nightly. We will reassess the blood pressure at the follow-up visit. If it remains elevated we will make adjustments in these medications.

## 2016-09-07 NOTE — Patient Instructions (Signed)
It was good to see you today.  1) Keep taking the medications as you are.  2) We checked some blood work today.  I will call you with the results next week.  I will see you back in 3 months, sooner if necessary.

## 2016-09-07 NOTE — Assessment & Plan Note (Signed)
Assessment  His diabetes is under excellent control with today's hemoglobin A1c being 5.4. This is on glipizide 5 mg by mouth daily and metformin 1000 mg by mouth twice daily. One could argue his diabetes may be too tightly controlled. With regards to his erectile dysfunction we have tried multiple modalities and at this point he is given up.  Plan  We will continue the glipizide 5 mg by mouth daily and metformin 1000 mg by mouth twice daily. If his hemoglobin A1c remains less than 6 at the follow-up visit we will decrease the glipizide to 2.5 mg by mouth daily. With regards to the erectile dysfunction we have no further therapy that is affordable and available at this time.

## 2016-09-07 NOTE — Assessment & Plan Note (Signed)
Assessment  Recently he was found to have both hypocalcemia and hypomagnesemia. A basic metabolic panel was obtained during this visit and is pending at the time of this dictation.  Plan  We will reassess the calcium level upon the resulting on the basic metabolic panel that was obtained today. We will also follow-up on the magnesium level obtained today. He was encouraged to continue to take his magnesium oxide 400 mg by mouth twice daily.

## 2016-09-07 NOTE — Assessment & Plan Note (Signed)
Assessment  Despite the 9 pound weight gain since the last visit his chronic dyspnea is unchanged. Some of the weight gain is related to heavier close and boots given the extremely cold weather today. His current heart failure regimen includes torsemide 20 mg by mouth 3 times weekly, valsartan 160 mg by mouth daily, metoprolol 25 mg by mouth twice daily, and terazosin 5 mg by mouth daily for afterload.  Plan  We will continue the torsemide 20 mg by mouth 3 times weekly, valsartan 160 mg by mouth daily, metoprolol 25 mg by mouth twice daily, and terazosin 5 mg by mouth daily. We will reassess for evidence of decompensation of his chronic diastolic heart failure at the follow-up visit.

## 2016-09-07 NOTE — Assessment & Plan Note (Signed)
Assessment  Since restarting the metoprolol after being seen in the emergency department he has had no further chest pain. He has not required any sublingual nitroglycerin. His current antianginal regimen includes metoprolol 25 mg by mouth twice daily, atorvastatin 20 mg by mouth nightly, and as needed sublingual nitroglycerin.  Plan  We will continue with the metoprolol 25 mg by mouth twice daily, atorvastatin 20 mg by mouth nightly, and as needed subungual nitroglycerin. We will reassess for evidence of angina at the follow-up visit.

## 2016-09-07 NOTE — Assessment & Plan Note (Signed)
Assessment  He is tolerating atorvastatin 20 mg by mouth nightly without myalgias.  Plan  We will continue his high intensity statin and reassess for intolerances at the follow-up visit.

## 2016-09-07 NOTE — Assessment & Plan Note (Signed)
Assessment  His posttraumatic chronic pain syndrome is unchanged. We have slowly been lowering the monthly oxycodone-acetaminophen dose. He states that he now is running out of the Percocet before the month is over. He also had to stop the MS Contin as it was resulting in nausea. Since he stopped the MS Contin, the nausea has resolved. I was hopeful the MS Contin would help control some of the pain and decrease the amount of Percocet required. At this point I have no other options to help control his chronic pain, yet I believe it's important to continue to wean his Percocet given the history of alcohol use and abuse.  Plan  We will continue the oxycodone-acetaminophen 10-325 mg 1 tablet every 6 hours as needed for pain with the very slow taper to a target of 90 tablets per month. He was given 3 prescriptions to get him through March. With each month the number of tablets dispensed was decreased by 1. The MS Contin was discontinued given the nausea. We will reassess the control of his pain at the follow-up visit and continue with the very slow taper.

## 2016-09-07 NOTE — Progress Notes (Signed)
   Subjective:    Patient ID: Juan Calderon, male    DOB: Dec 08, 1955, 61 y.o.   MRN: [id number]  HPI  Juan Calderon is here for follow-up of his chronic LLE pain, chronic diastolic heart failure, coronary artery disease with stable angina, type II diabetes complicated by ED, hypomagesemia and hypocalcemia. Please see the A&P for the status of the pt's chronic medical problems.  Review of Systems  Constitutional: Negative for activity change, appetite change and unexpected weight change.  Respiratory: Positive for shortness of breath. Negative for chest tightness.        Dyspnea is chronic and unchanged  Cardiovascular: Positive for leg swelling. Negative for chest pain and palpitations.       LLE edema > RLE edema.  This is chronic and unchanged.  Gastrointestinal: Positive for constipation. Negative for abdominal pain, diarrhea, nausea and vomiting.       Constipation is symptomatically improved with as needed sorbitol.  Musculoskeletal: Positive for arthralgias, back pain, gait problem and neck pain. Negative for joint swelling and myalgias.       Chronic pain is unchanged.      Objective:   Physical Exam  Constitutional: He is oriented to person, place, and time. He appears well-developed and well-nourished. No distress.  HENT:  Head: Normocephalic and atraumatic.  Eyes: Conjunctivae are normal. Right eye exhibits no discharge. Left eye exhibits no discharge. No scleral icterus.  Cardiovascular: Normal rate, regular rhythm and normal heart sounds.  Exam reveals no gallop and no friction rub.   No murmur heard. Pulmonary/Chest: Effort normal and breath sounds normal. No respiratory distress. He has no wheezes. He has no rales.  Musculoskeletal: Normal range of motion. He exhibits edema, tenderness and deformity.  Neurological: He is alert and oriented to person, place, and time. He exhibits normal muscle tone.  Skin: Skin is warm and dry. No rash noted. He is not diaphoretic. No  erythema.  Psychiatric: He has a normal mood and affect. His behavior is normal. Judgment and thought content normal.  Nursing note and vitals reviewed.     Assessment & Plan:   Please see problem oriented charting.

## 2016-09-07 NOTE — Assessment & Plan Note (Signed)
Assessment  He has not followed up to have his oral appliance manufactured as of yet secondary to financial reasons.  Plan  He was encouraged to have the oral appliance manufactured as soon as he had the money to afford it. We will reassess whether or not this was accomplished at the follow-up visit.

## 2016-09-08 LAB — BMP8+ANION GAP
ANION GAP: 15 mmol/L (ref 10.0–18.0)
BUN / CREAT RATIO: 13 (ref 10–24)
BUN: 11 mg/dL (ref 8–27)
CALCIUM: 9.1 mg/dL (ref 8.6–10.2)
CHLORIDE: 102 mmol/L (ref 96–106)
CO2: 26 mmol/L (ref 18–29)
CREATININE: 0.88 mg/dL (ref 0.76–1.27)
GFR calc Af Amer: 106 mL/min/{1.73_m2} (ref >59)
GFR calc non Af Amer: 92 mL/min/{1.73_m2} (ref >59)
GLUCOSE: 82 mg/dL (ref 65–99)
Potassium: 4 mmol/L (ref 3.5–5.2)
Sodium: 143 mmol/L (ref 134–144)

## 2016-09-08 LAB — MAGNESIUM: MAGNESIUM: 1.2 mg/dL — AB (ref 1.6–2.3)

## 2016-09-10 NOTE — Progress Notes (Signed)
Patient ID: Juan Calderon, male   DOB: 1956/02/11, 61 y.o.   MRN: [id number]  BMP: unremarkable including a normal Ca2+, K+, and eGFR.  Magnesium remains low at 1.2  I called [name] and informed him of the results.  I encouraged him to continue with the MgO 400 mg BID.  I am hopeful this dose will eventually replete his magnesium stores, which were normal 10 months ago.  We will reassess the Mg level at the follow-up visit.

## 2016-09-13 LAB — TOXASSURE SELECT,+ANTIDEPR,UR

## 2016-09-18 NOTE — Progress Notes (Signed)
Patient ID: Juan Calderon, male   DOB: [birth year], 61 y.o.   MRN: [id number]  UDS with oxycodone and appropriate metabolites.  No unexpected substances.  Will continue current short acting oxycodone with continued wean to a more reasonable PRN number per month.  With chronic pain not adequately managed by short acting oxycodone and an intolerance to MS Contin will consider a trial of OxyContin at the follow-up visit.

## 2016-10-09 ENCOUNTER — Other Ambulatory Visit: Payer: Self-pay | Admitting: Internal Medicine

## 2016-10-09 DIAGNOSIS — H4010X0 Unspecified open-angle glaucoma, stage unspecified: Secondary | ICD-10-CM

## 2016-10-17 ENCOUNTER — Telehealth: Payer: Self-pay | Admitting: Internal Medicine

## 2016-10-17 ENCOUNTER — Ambulatory Visit (INDEPENDENT_AMBULATORY_CARE_PROVIDER_SITE_OTHER): Payer: Medicare Other | Admitting: Pulmonary Disease

## 2016-10-17 DIAGNOSIS — J449 Chronic obstructive pulmonary disease, unspecified: Secondary | ICD-10-CM | POA: Diagnosis not present

## 2016-10-17 DIAGNOSIS — J069 Acute upper respiratory infection, unspecified: Secondary | ICD-10-CM | POA: Diagnosis not present

## 2016-10-17 MED ORDER — DEXTROMETHORPHAN-GUAIFENESIN 20-400 MG/5ML PO SYRP: 5.0000 mL | ORAL_SOLUTION | ORAL | 0 refills | Status: DC | PRN

## 2016-10-17 MED ORDER — FLUTICASONE PROPIONATE 50 MCG/ACT NA SUSP: 2.0000 | Freq: Every day | NASAL | 0 refills | Status: DC

## 2016-10-17 MED ORDER — BENZONATATE 200 MG PO CAPS: 200.0000 mg | ORAL_CAPSULE | Freq: Three times a day (TID) | ORAL | 0 refills | Status: DC | PRN

## 2016-10-17 MED ORDER — ALBUTEROL SULFATE HFA 108 (90 BASE) MCG/ACT IN AERS: 2.0000 | INHALATION_SPRAY | Freq: Four times a day (QID) | RESPIRATORY_TRACT | 0 refills | Status: DC | PRN

## 2016-10-17 NOTE — Progress Notes (Signed)
   CC: cough  HPI:  [name] is a 61 y.o. man with history as noted below here for evaluation of cough.  Nasal congestion since Sunday. Sneezing this morning. Cough as well. Cough is nonproductive. Feels like he has mucous stuck in his throat. He has white nasal drainage. No blood. No fevers or chills. No nausea/vomiting. No myalgias. No diarrhea. No dysuria. No sick contacts. Does not use nasal sprays or inhalers currently. No tobacco use currently. No post nasal drip. No ear pain.    Past Medical History:  Diagnosis Date  . Alcohol abuse    . C6 radiculopathy 01/24/2016   Right upper extremity, mild to moderate electrically by EMG on 01/24/2016  . Cataract    Left eye  . Chronic diastolic heart failure (HCC)     with mild left ventricular hypertrophy on Echo 02/2010  . Chronic obstructive pulmonary disease (HCC)    . Chronic osteomyelitis of femur (HCC) 04/06/2016  . Chronic pain syndrome     Left arm and leg s/p traumatic injury   . Chronic renal insufficiency    . Coronary artery disease     25 % LAD stenosis on cath 2007.  Stable angina.  . Diverticulosis    . Essential hypertension    . Gastroesophageal reflux disease    . Gout    . Hyperlipidemia LDL goal < 100    . Long-term current use of opiate analgesic 09/07/2016  . Mild carpal tunnel syndrome of right wrist 01/24/2016   Mild degree electrically per EMG 01/24/2016   . Morbid obesity with BMI of 40.0-44.9, adult ([place])    . Normocytic anemia    . Obstructive sleep apnea     Moderate, AHI 29.8 per hour with moderately loud snoring and oxygen desaturation to a nadir of 79%. CPAP titration resulted in a prescription for 17 CWP.    [name] Open-angle glaucoma    . Osteoarthritis cervical spine    . Osteoarthritis of left knee 06/19/2013   Tricompartmental disease.  Treated with double hinged upright knee brace, steroid/xylocaine knee injections, and NSAIDs   . Paroxysmal atrial fibrillation ([place]) 10/25/2015  . Persistent  atrial fibrillation ([place])    . Right rotator cuff tear     Large full-thickness tear of the supraspinatus with mild retraction but no atrophy   . Subclinical hypothyroidism    . 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 (HCC)      Review of Systems:   No diarrhea No dysuria  Physical Exam:  Vitals:   10/17/16 1520  BP: (!) 154/79  Pulse: 70  Temp: 97.9 F (36.6 C)  TempSrc: Oral  SpO2: 97%  Weight: 299 lb 12.8 oz (136 kg)  Height: 6' (1.829 m)   General Apperance: NAD HEENT: Normocephalic, atraumatic, anicteric sclera Neck: Supple, trachea midline Lungs: Clear to auscultation bilaterally. No wheezes, rhonchi or rales. Breathing comfortably Heart: Regular rate and rhythm, no murmur/rub/gallop Abdomen: Soft, nontender, nondistended, no rebound/guarding Extremities: Warm and well perfused, no edema Skin: No rashes or lesions Neurologic: Alert and interactive. No gross deficits.  Assessment & Plan:   See Encounters Tab for problem based charting.  Patient discussed with Dr. Dareen Piano

## 2016-10-17 NOTE — Patient Instructions (Addendum)
Use the nasal spray daily  You may use the albuterol inhaler every 6 hours as needed for wheezing or shortness of breath You may use the tessalon perls for cough Follow up with your primary care provider as scheduled  NASAL STEROID USE INSTRUCTIONS  Step 1. Prepare the nose. Blow the nose before administering the drug.  Step 2. Prime and activate the delivery device as recommended by the manufacturer.  Step 3. Position the head by tilting the head forward.  Step 4. Insert the tip of the applicator gently, avoiding contact with the septum.  Step 5. Aim the applicator tip about 45 from the floor of the nose and direct it at the outer corner of the eye on the same side to avoid traumatizing or spraying the septum.  Step 6. Close the other nostril gently with a finger.   Step 7. Sniff or inhale gently while delivering the drug.

## 2016-10-18 DIAGNOSIS — J069 Acute upper respiratory infection, unspecified: Secondary | ICD-10-CM | POA: Insufficient documentation

## 2016-10-18 NOTE — Assessment & Plan Note (Signed)
Assessment: Nasal congestion, sneezing, cough, rhinorrhea x 4 days. Lungs clear to auscultation.  Plan: Flonase 2 sprays daily He does not want to do saline rinses He reports some intermittent wheezing with history of COPD. No wheezing today. Albuterol inhaler prescribed. Dextromethorphan-guaifenesin prn cough

## 2016-10-19 NOTE — Telephone Encounter (Signed)
Entered in Error

## 2016-10-23 NOTE — Progress Notes (Signed)
Internal Medicine Clinic Attending  Case discussed with Dr. Krall at the time of the visit.  We reviewed the resident's history and exam and pertinent patient test results.  I agree with the assessment, diagnosis, and plan of care documented in the resident's note.  

## 2016-11-22 ENCOUNTER — Other Ambulatory Visit: Payer: Self-pay | Admitting: *Deleted

## 2016-11-22 DIAGNOSIS — J300 Vasomotor rhinitis: Secondary | ICD-10-CM

## 2016-11-23 MED ORDER — FLUTICASONE PROPIONATE 50 MCG/ACT NA SUSP: 2.0000 | Freq: Every day | NASAL | 1 refills | Status: DC

## 2016-12-05 ENCOUNTER — Encounter: Payer: Self-pay | Admitting: Internal Medicine

## 2016-12-05 ENCOUNTER — Ambulatory Visit (INDEPENDENT_AMBULATORY_CARE_PROVIDER_SITE_OTHER): Payer: Medicare Other | Admitting: *Deleted

## 2016-12-05 ENCOUNTER — Ambulatory Visit (INDEPENDENT_AMBULATORY_CARE_PROVIDER_SITE_OTHER): Payer: Medicare Other | Admitting: Internal Medicine

## 2016-12-05 VITALS — BP 126/84 | HR 64 | Ht 72.0 in | Wt 298.0 lb

## 2016-12-05 DIAGNOSIS — Z5181 Encounter for therapeutic drug level monitoring: Secondary | ICD-10-CM | POA: Diagnosis not present

## 2016-12-05 DIAGNOSIS — I5030 Unspecified diastolic (congestive) heart failure: Secondary | ICD-10-CM

## 2016-12-05 DIAGNOSIS — I480 Paroxysmal atrial fibrillation: Secondary | ICD-10-CM

## 2016-12-05 DIAGNOSIS — I25119 Atherosclerotic heart disease of native coronary artery with unspecified angina pectoris: Secondary | ICD-10-CM

## 2016-12-05 LAB — POCT INR: INR: 1.7

## 2016-12-05 NOTE — Patient Instructions (Signed)
Medication Instructions: - Your physician recommends that you continue on your current medications as directed. Please refer to the Current Medication list given to you today.  Labwork: - Your physician recommends that you have lab work today: Magnesium/ CBC  Procedures/Testing: - none ordered  Follow-Up: - Your physician wants you to follow-up in: 6 months with Tommye Standard, PA for Dr. [name]. You will receive a reminder letter in the mail two months in advance. If you don't receive a letter, please call our office to schedule the follow-up appointment.   Any Additional Special Instructions Will Be Listed Below (If Applicable).     If you need a refill on your cardiac medications before your next appointment, please call your pharmacy.  s

## 2016-12-05 NOTE — Progress Notes (Signed)
Patient Care Team: Oval Linsey, MD as PCP - General (Internal Medicine) Clent Jacks, MD as Consulting Physician (Ophthalmology)   HPI  Juan Calderon is a 61 y.o. male Seen in follow-up for atrial fibrillation. He was started on flecainide. He has been holding sinus rhythm.  He has significant bradycardia  He has not had any atrial fibrillation of which he is aware.  He has chronic shortness of breath which is gradually improving  He has a history of sleep apnea, but they continue to work on a CPAP system which he can tolerate;  He is working on oral appliance  Echocardiogram 5/16-EF 55-60% with an LA dimension of 48.    Records and Results Reviewed As above  Mg 1.2 1/18  He was started on repletion     Past Medical History:  Diagnosis Date  . Alcohol abuse    . C6 radiculopathy 01/24/2016   Right upper extremity, mild to moderate electrically by EMG on 01/24/2016  . Cataract    Left eye  . Chronic diastolic heart failure ([place])     with mild left ventricular hypertrophy on Echo 02/2010  . Chronic obstructive pulmonary disease ([place])    . Chronic osteomyelitis of femur ([place]) 04/06/2016  . Chronic pain syndrome     Left arm and leg s/p traumatic injury   . Chronic renal insufficiency    . Coronary artery disease     25% LAD stenosis on cath 2007.  Stable angina.  . Diverticulosis    . Essential hypertension    . Gastroesophageal reflux disease    . Gout    . Hyperlipidemia LDL goal < 100    . Long-term current use of opiate analgesic 09/07/2016  . Mild carpal tunnel syndrome of right wrist 01/24/2016   Mild degree electrically per EMG 01/24/2016   . Morbid obesity with BMI of 40.0-44.9, adult ([place])    . Normocytic anemia    . Obstructive sleep apnea     Moderate, AHI 29.8 per hour with moderately loud snoring and oxygen desaturation to a nadir of 79%. CPAP titration resulted in a prescription for 17 CWP.    [name] Open-angle glaucoma    . Osteoarthritis cervical  spine    . Osteoarthritis of left knee 06/19/2013   Tricompartmental disease.  Treated with double hinged upright knee brace, steroid/xylocaine knee injections, and NSAIDs   . Paroxysmal atrial fibrillation ([place]) 10/25/2015  . Persistent atrial fibrillation ([place])    . Right rotator cuff tear     Large full-thickness tear of the supraspinatus with mild retraction but no atrophy   . Subclinical hypothyroidism    . 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 [facility])      Past Surgical History:  Procedure Laterality Date  . CARDIOVERSION N/A 12/30/2014   Procedure: CARDIOVERSION;  Surgeon: [name], MD;  Location: [facility] ENDOSCOPY;  Service: Cardiovascular;  Laterality: N/A;  . FRACTURE SURGERY Left 1980's   Elbow  . Left arm surgery    . Left leg surgery    . SHOULDER SURGERY     Right    Current Outpatient Prescriptions  Medication Sig Dispense Refill  . albuterol (PROVENTIL HFA;VENTOLIN HFA) 108 (90 Base) MCG/ACT inhaler Inhale 2 puffs into the lungs every 6 (six) hours as needed for wheezing or shortness of breath. 1 Inhaler 0  . allopurinol (ZYLOPRIM) 300 MG tablet Take 1 tablet (300 mg total) by mouth daily. 90 tablet 3  .  atorvastatin (LIPITOR) 20 MG tablet Take 1 tablet (20 mg total) by mouth daily. 90 tablet 3  . Blood Glucose Monitoring Suppl (ONETOUCH VERIO) w/Device KIT 1 each by Does not apply route daily. 1 kit 0  . Dextromethorphan-Guaifenesin 20-400 MG/5ML SYRP Take 5 mLs by mouth every 4 (four) hours as needed (cough). 120 mL 0  . esomeprazole (NEXIUM) 40 MG capsule Take 1 capsule (40 mg total) by mouth daily. 90 capsule 3  . flecainide (TAMBOCOR) 100 MG tablet Take 1 tablet (100 mg total) by mouth every 12 (twelve) hours. 180 tablet 3  . fluticasone (FLONASE) 50 MCG/ACT nasal spray Place 2 sprays into both nostrils daily. 48 g 1  . glipiZIDE (GLUCOTROL) 5 MG tablet Take 1 tablet (5 mg total) by mouth daily before breakfast. 90 tablet 3  .  glucose blood (ONETOUCH VERIO) test strip Use as instructed 100 each 12  . latanoprost (XALATAN) 0.005 % ophthalmic solution Place 1 drop into both eyes at bedtime. 7.5 mL 2  . magnesium oxide (MAG-OX) 400 MG tablet Take 1 tablet (400 mg total) by mouth 2 (two) times daily. 180 tablet 1  . metFORMIN (GLUCOPHAGE) 1000 MG tablet Take 1 tablet (1,000 mg total) by mouth 2 (two) times daily with a meal. 180 tablet 3  . metoprolol tartrate (LOPRESSOR) 25 MG tablet Take 1 tablet (25 mg total) by mouth 2 (two) times daily. 180 tablet 3  . nitroGLYCERIN (NITROSTAT) 0.4 MG SL tablet Place 1 tablet (0.4 mg total) under the tongue every 5 (five) minutes as needed for chest pain. 30 tablet 11  . ONETOUCH DELICA LANCETS 40N MISC Use 1 strip daily 100 each 5  . oxyCODONE-acetaminophen (PERCOCET) 10-325 MG tablet Take 1 tablet by mouth every 6 (six) hours as needed for pain. 105 tablet 0  . sorbitol 70 % solution TAKE 15-60 ML BY MOUTH DAILY AS NEEDED (MIX IN COFFEE, JUICE, OR WATER). 473 mL 6  . terazosin (HYTRIN) 5 MG capsule Take 1 capsule (5 mg total) by mouth at bedtime. 90 capsule 3  . torsemide (DEMADEX) 20 MG tablet Take 2 tablets three times weekly 72 tablet 3  . valsartan (DIOVAN) 160 MG tablet Take 1 tablet (160 mg total) by mouth daily. 90 tablet 3  . warfarin (COUMADIN) 5 MG tablet TAKE AS DIRECTED BY COUMADIN CLINIC 30 tablet 3   No current facility-administered medications for this visit.     Allergies  Allergen Reactions  . Ramipril Swelling  . Testosterone Rash      Review of Systems negative except from HPI and PMH  Physical Exam BP 126/84   Pulse 64   Ht 6' (1.829 m)   Wt 298 lb (135.2 kg)   SpO2 93%   BMI 40.42 kg/m  Well developed and well nourished in no acute distress HENT normal E scleral and icterus clear Neck Supple JVP not assessed as he is sitting up  Regular rate and rhythm, no murmurs gallops or rub Soft with active bowel sounds No clubbing cyanosis 1+  Edema Alert and oriented, grossly normal motor and sensory function;  Very abnormal gait x decades Skin Warm and Dry  ECG demonstrates atrial  rhythm at 60 -interval 17/10/51 Axis 8 + nonspecific T-wave changes  Assessment and  Plan  Atrial fibrillation paroxysmal   HFpEF  Morbid obesity  Obstructive sleep apnea    HR excursion with ambulation is into the low 80s in the office.  There is no significant volume overload.  He  is working on a sleep appliance for his apnea.  QRS duration is within range on flecainide.  Magnesium was a very low January 18; he was started on repletion. We'll check it today.  We will also check his hemoglobin was low 10/17.

## 2016-12-06 ENCOUNTER — Telehealth: Payer: Self-pay | Admitting: Internal Medicine

## 2016-12-06 LAB — CBC WITH DIFFERENTIAL/PLATELET
BASOS ABS: 0 10*3/uL (ref 0.0–0.2)
Basos: 0 %
EOS (ABSOLUTE): 0.1 10*3/uL (ref 0.0–0.4)
Eos: 1 %
HEMATOCRIT: 36.8 % — AB (ref 37.5–51.0)
Hemoglobin: 12.3 g/dL — ABNORMAL LOW (ref 13.0–17.7)
IMMATURE GRANS (ABS): 0 10*3/uL (ref 0.0–0.1)
Immature Granulocytes: 0 %
LYMPHS ABS: 1.7 10*3/uL (ref 0.7–3.1)
Lymphs: 24 %
MCH: 29.1 pg (ref 26.6–33.0)
MCHC: 33.4 g/dL (ref 31.5–35.7)
MCV: 87 fL (ref 79–97)
MONOCYTES: 6 %
MONOS ABS: 0.4 10*3/uL (ref 0.1–0.9)
NEUTROS ABS: 4.9 10*3/uL (ref 1.4–7.0)
Neutrophils: 69 %
Platelets: 187 10*3/uL (ref 150–379)
RBC: 4.23 x10E6/uL (ref 4.14–5.80)
RDW: 14.7 % (ref 12.3–15.4)
WBC: 7.1 10*3/uL (ref 3.4–10.8)

## 2016-12-06 LAB — MAGNESIUM: MAGNESIUM: 1.1 mg/dL — AB (ref 1.6–2.3)

## 2016-12-06 NOTE — Telephone Encounter (Signed)
Calling to confirm appt for 12/07/16 at 10:45 no answer

## 2016-12-07 ENCOUNTER — Ambulatory Visit (INDEPENDENT_AMBULATORY_CARE_PROVIDER_SITE_OTHER): Payer: Medicare Other | Admitting: Internal Medicine

## 2016-12-07 ENCOUNTER — Encounter: Payer: Self-pay | Admitting: Internal Medicine

## 2016-12-07 ENCOUNTER — Other Ambulatory Visit: Payer: Self-pay | Admitting: Cardiovascular Disease

## 2016-12-07 VITALS — BP 132/81 | HR 56 | Temp 98.2°F | Wt 298.7 lb

## 2016-12-07 DIAGNOSIS — I25119 Atherosclerotic heart disease of native coronary artery with unspecified angina pectoris: Secondary | ICD-10-CM

## 2016-12-07 DIAGNOSIS — N521 Erectile dysfunction due to diseases classified elsewhere: Secondary | ICD-10-CM

## 2016-12-07 DIAGNOSIS — I480 Paroxysmal atrial fibrillation: Secondary | ICD-10-CM | POA: Diagnosis not present

## 2016-12-07 DIAGNOSIS — H4010X0 Unspecified open-angle glaucoma, stage unspecified: Secondary | ICD-10-CM

## 2016-12-07 DIAGNOSIS — J300 Vasomotor rhinitis: Secondary | ICD-10-CM | POA: Diagnosis not present

## 2016-12-07 DIAGNOSIS — Z7984 Long term (current) use of oral hypoglycemic drugs: Secondary | ICD-10-CM

## 2016-12-07 DIAGNOSIS — T402X5D Adverse effect of other opioids, subsequent encounter: Secondary | ICD-10-CM

## 2016-12-07 DIAGNOSIS — M79605 Pain in left leg: Secondary | ICD-10-CM

## 2016-12-07 DIAGNOSIS — E1140 Type 2 diabetes mellitus with diabetic neuropathy, unspecified: Secondary | ICD-10-CM

## 2016-12-07 DIAGNOSIS — T402X5A Adverse effect of other opioids, initial encounter: Secondary | ICD-10-CM

## 2016-12-07 DIAGNOSIS — I5032 Chronic diastolic (congestive) heart failure: Secondary | ICD-10-CM

## 2016-12-07 DIAGNOSIS — Z79891 Long term (current) use of opiate analgesic: Secondary | ICD-10-CM | POA: Diagnosis not present

## 2016-12-07 DIAGNOSIS — I10 Essential (primary) hypertension: Secondary | ICD-10-CM

## 2016-12-07 DIAGNOSIS — E785 Hyperlipidemia, unspecified: Secondary | ICD-10-CM | POA: Diagnosis not present

## 2016-12-07 DIAGNOSIS — I110 Hypertensive heart disease with heart failure: Secondary | ICD-10-CM

## 2016-12-07 DIAGNOSIS — Z7901 Long term (current) use of anticoagulants: Secondary | ICD-10-CM

## 2016-12-07 DIAGNOSIS — G894 Chronic pain syndrome: Secondary | ICD-10-CM

## 2016-12-07 DIAGNOSIS — E7849 Other hyperlipidemia: Secondary | ICD-10-CM

## 2016-12-07 DIAGNOSIS — K5903 Drug induced constipation: Secondary | ICD-10-CM

## 2016-12-07 DIAGNOSIS — M79602 Pain in left arm: Secondary | ICD-10-CM

## 2016-12-07 DIAGNOSIS — I4891 Unspecified atrial fibrillation: Secondary | ICD-10-CM

## 2016-12-07 LAB — GLUCOSE, CAPILLARY: GLUCOSE-CAPILLARY: 166 mg/dL — AB (ref 65–99)

## 2016-12-07 LAB — POCT GLYCOSYLATED HEMOGLOBIN (HGB A1C): Hemoglobin A1C: 6.2

## 2016-12-07 MED ORDER — OXYCODONE-ACETAMINOPHEN 10-325 MG PO TABS: 1.0000 | ORAL_TABLET | Freq: Four times a day (QID) | ORAL | 0 refills | Status: DC | PRN

## 2016-12-07 MED ORDER — MAGNESIUM OXIDE 400 MG PO TABS: 800.0000 mg | ORAL_TABLET | Freq: Every day | ORAL | 3 refills | Status: DC

## 2016-12-07 NOTE — Assessment & Plan Note (Signed)
Assessment  He is tolerating the atorvastatin 20 mg by mouth daily without myalgias.  Plan  We will continue this moderate intensity statin and reassess for evidence of intolerances at the follow-up visit.

## 2016-12-07 NOTE — Assessment & Plan Note (Signed)
Assessment  He is tolerating the latanoprost 0.005% 1 drop daily at bedtime well.  Plan  He plans on following up with his ophthalmologist in June for assessment for diabetic retinopathy. At the same time he will be reassessed for the efficacy of the latanoprost for his open angle glaucoma.

## 2016-12-07 NOTE — Assessment & Plan Note (Signed)
Assessment  His diabetes is well controlled today with a hemoglobin A1c of 6.2. This is on glyburide 5 mg by mouth daily and metformin 1000 mg by mouth twice daily. I reviewed his glucometer over the last month he had 15 readings with an average glucose of 121. This is consistent with his hemoglobin A1c of 6.2. He is hoping to lose some weight and the glyburide is unlikely helping.  Plan  We will continue the metformin 1000 mg by mouth twice daily and discontinue the glyburide in hopes of allowing for some weight loss. He will be due for his diabetic eye exam in June and plans on having it scheduled at that time. We will reassess the efficacy of the metformin 1000 mg by mouth twice daily and his attempts at weight loss with the discontinuance of the glyburide when he is seen at the follow-up visit.

## 2016-12-07 NOTE — Assessment & Plan Note (Signed)
Assessment  He has had no chest pain on his current antianginal regimen which consists of metoprolol 25 mg by mouth twice daily and atorvastatin 20 mg by mouth daily.  Plan  We will continue the metoprolol at 25 mg by mouth twice daily and atorvastatin at 20 mg by mouth daily. We will also continue his antihypertensive regimen. We will reassess the efficacy of this regimen in controlling his angina at the follow-up visit.

## 2016-12-07 NOTE — Assessment & Plan Note (Signed)
Assessment  He felt the sorbitol 70% was no longer effective in controlling his constipation.  Plan  We stopped the sorbitol and will reassess his constipation at the follow-up visit.

## 2016-12-07 NOTE — Assessment & Plan Note (Signed)
Assessment  He does not feel the Flonase has helped his vasomotor rhinitis.  Plan  I advised him to hold the Flonase and assess his response. If his symptoms do not worsen the Flonase was likely ineffective. Where this to be the case, he can throw the Flonase away. If he stopped the Flonase and his symptoms worsened that would suggest that it was somewhat efficacious and we would reassess the regimen at the follow-up visit.

## 2016-12-07 NOTE — Assessment & Plan Note (Signed)
He is up-to-date on his health care maintenance. 

## 2016-12-07 NOTE — Assessment & Plan Note (Addendum)
Assessment  He has not had any signs or symptoms suggestive of a recurrence of his paroxysmal atrial fibrillation. Cardiac exam today was regularly regular also suggesting he is currently not in atrial fibrillation. He also is tolerating the antiarrhythmic flecainide well.  Plan  We will continue the metoprolol at 25 mg by mouth twice daily should he convert back into atrial fibrillation. He is also being maintained on warfarin and is followed closely in the anticoagulation clinic. He will be maintained on the flecainide as well. We will reassess for signs or symptoms suggestive of atrial fibrillation upon his return visit.

## 2016-12-07 NOTE — Assessment & Plan Note (Signed)
Assessment  His chronic diastolic heart failure symptomatically controlled on his antihypertensive regimen and torsemide 40 mg by mouth 3 times weekly.  Plan  We will continue his current antihypertensive regimen and torsemide 40 mg by mouth 3 times daily. We will reassess for evidence of decompensation of his chronic diastolic heart failure at follow-up visit.

## 2016-12-07 NOTE — Assessment & Plan Note (Signed)
Assessment  His chronic left arm and leg pain are improved compared to last visit where he had an exacerbation of his pain. He did not tolerate the addition of MS Contin secondary to nausea and has continued to take his oxycodone. This has provided enough relief to allow him to remain functional. He currently receives oxycodone-acetaminophen 10-325 mg 1 tablet every 6 hours as needed for moderate to severe pain, dispense #105 per month.  Plan  We will continue the oxycodone-acetaminophen 10-325 mg 1 tablet every 6 hours as needed for moderate to severe pain, dispense #105 per month. We will reassess the efficacy of this therapy at the follow-up visit. The [place] controlled substance database was reviewed today. There are no suspicious entries. We discussed the addition of OxyContin in the future should the pain become severe once again although there is the risk of the same side effects of nausea as there were with the MS Contin.

## 2016-12-07 NOTE — Assessment & Plan Note (Signed)
Assessment  His blood pressure is well controlled today at 132/81. This is at his target. His current regimen includes metoprolol 25 mg by mouth twice daily, valsartan 160 mg by mouth daily, and terazosin 5 mg by mouth daily.  Plan  We will continue the metoprolol, valsartan, and terazosin at the current doses and reassess his blood pressure control at the follow-up visit.

## 2016-12-07 NOTE — Progress Notes (Signed)
   Subjective:    Patient ID: Juan Calderon, male    DOB: 10/11/1955, 61 y.o.   MRN: [id number]  HPI  ROEMELLO SPEYER is here for follow-up of his chronic pain, chronic diastolic heart failure, diabetes, and obesity. Please see the A&P for the status of the pt's chronic medical problems.  Review of Systems  Constitutional: Negative for activity change, appetite change and unexpected weight change.  HENT: Positive for congestion, postnasal drip and rhinorrhea.   Respiratory: Negative for chest tightness, shortness of breath and wheezing.   Cardiovascular: Positive for leg swelling. Negative for chest pain and palpitations.  Gastrointestinal: Positive for constipation. Negative for abdominal pain, blood in stool, diarrhea, nausea and vomiting.  Musculoskeletal: Positive for arthralgias, back pain and gait problem. Negative for joint swelling and myalgias.  Skin: Positive for color change.      Objective:   Physical Exam  Constitutional: He is oriented to person, place, and time. He appears well-developed and well-nourished. No distress.  HENT:  Head: Normocephalic and atraumatic.  Eyes: Conjunctivae are normal. Right eye exhibits no discharge. Left eye exhibits no discharge. No scleral icterus.  Cardiovascular: Normal rate, regular rhythm and normal heart sounds.  Exam reveals no gallop and no friction rub.   No murmur heard. Pulmonary/Chest: Effort normal and breath sounds normal. No respiratory distress. He has no wheezes. He has no rales.  Abdominal: Soft. Bowel sounds are normal. He exhibits no distension. There is no tenderness. There is no rebound and no guarding.  Musculoskeletal: Normal range of motion. He exhibits edema, tenderness and deformity.  Neurological: He is alert and oriented to person, place, and time. He exhibits normal muscle tone. Coordination normal.  Skin: Skin is warm and dry. No rash noted. He is not diaphoretic. No erythema.  Psychiatric: He has a normal mood  and affect. His behavior is normal. Judgment and thought content normal.  Nursing note and vitals reviewed.     Assessment & Plan:   Please see problem oriented charting.

## 2016-12-07 NOTE — Patient Instructions (Addendum)
It was good to see you again.  1) We stopped the following medications Sorbitol 70% Glyburide Flonase  2) Change the way you take Magnesium oxide.  Take 2 tablets with dinner.  This may increase your blood levels back to normal.  3) Keep taking the other medications as you are.  4) I gave you three months of prescriptions for the oxycodone.  I will see you back in 3 months, sooner if necessary.

## 2016-12-18 ENCOUNTER — Telehealth: Payer: Self-pay | Admitting: *Deleted

## 2016-12-18 NOTE — Telephone Encounter (Signed)
At pcp's request, pt was contacted and given appt for Fri 4/20 @0945 .  Pt states he's currently out of town and will not return until Sunday evening/night (4/22).  Pt has now been scheduled for the following Fri 04/27 @1115 .  Will send info to pcp.[name]4/17/20184:18 PM

## 2016-12-19 ENCOUNTER — Ambulatory Visit (INDEPENDENT_AMBULATORY_CARE_PROVIDER_SITE_OTHER): Payer: Medicare Other | Admitting: Pharmacist

## 2016-12-19 DIAGNOSIS — Z5181 Encounter for therapeutic drug level monitoring: Secondary | ICD-10-CM

## 2016-12-19 DIAGNOSIS — I25119 Atherosclerotic heart disease of native coronary artery with unspecified angina pectoris: Secondary | ICD-10-CM

## 2016-12-19 LAB — POCT INR: INR: 1.8

## 2016-12-20 ENCOUNTER — Other Ambulatory Visit: Payer: Self-pay | Admitting: Internal Medicine

## 2016-12-20 DIAGNOSIS — E1140 Type 2 diabetes mellitus with diabetic neuropathy, unspecified: Secondary | ICD-10-CM

## 2016-12-20 DIAGNOSIS — N521 Erectile dysfunction due to diseases classified elsewhere: Secondary | ICD-10-CM

## 2016-12-20 DIAGNOSIS — I480 Paroxysmal atrial fibrillation: Secondary | ICD-10-CM

## 2016-12-21 ENCOUNTER — Ambulatory Visit: Payer: Medicare Other | Admitting: Internal Medicine

## 2016-12-25 ENCOUNTER — Telehealth: Payer: Self-pay | Admitting: *Deleted

## 2016-12-25 NOTE — Telephone Encounter (Signed)
Pt called to confirm whether he was to continue metformin or stop it, referred to visit of 4/6 and dr [name] More instruction to stop glyburide and TO CONTINUE metformin, read that area of note to pt and ask him to stop glyburide and continue metformin, he was agreeable.

## 2016-12-27 ENCOUNTER — Telehealth: Payer: Self-pay | Admitting: Internal Medicine

## 2016-12-27 NOTE — Telephone Encounter (Signed)
APT. REMINDER CALL, LMTCB °

## 2016-12-28 ENCOUNTER — Ambulatory Visit (INDEPENDENT_AMBULATORY_CARE_PROVIDER_SITE_OTHER): Payer: Medicare Other | Admitting: Internal Medicine

## 2016-12-28 ENCOUNTER — Encounter: Payer: Self-pay | Admitting: Internal Medicine

## 2016-12-28 DIAGNOSIS — I110 Hypertensive heart disease with heart failure: Secondary | ICD-10-CM | POA: Diagnosis not present

## 2016-12-28 DIAGNOSIS — T402X5D Adverse effect of other opioids, subsequent encounter: Secondary | ICD-10-CM

## 2016-12-28 DIAGNOSIS — Z79899 Other long term (current) drug therapy: Secondary | ICD-10-CM

## 2016-12-28 DIAGNOSIS — F1010 Alcohol abuse, uncomplicated: Secondary | ICD-10-CM

## 2016-12-28 DIAGNOSIS — I5032 Chronic diastolic (congestive) heart failure: Secondary | ICD-10-CM | POA: Diagnosis not present

## 2016-12-28 DIAGNOSIS — T402X5A Adverse effect of other opioids, initial encounter: Secondary | ICD-10-CM

## 2016-12-28 DIAGNOSIS — I10 Essential (primary) hypertension: Secondary | ICD-10-CM

## 2016-12-28 DIAGNOSIS — K5903 Drug induced constipation: Secondary | ICD-10-CM | POA: Diagnosis not present

## 2016-12-28 DIAGNOSIS — J300 Vasomotor rhinitis: Secondary | ICD-10-CM

## 2016-12-28 DIAGNOSIS — K219 Gastro-esophageal reflux disease without esophagitis: Secondary | ICD-10-CM

## 2016-12-28 DIAGNOSIS — Z0000 Encounter for general adult medical examination without abnormal findings: Secondary | ICD-10-CM

## 2016-12-28 MED ORDER — AMILORIDE HCL 5 MG PO TABS: 5.0000 mg | ORAL_TABLET | Freq: Every day | ORAL | 3 refills | Status: DC

## 2016-12-28 MED ORDER — MAGNESIUM OXIDE 400 MG PO TABS: 800.0000 mg | ORAL_TABLET | Freq: Two times a day (BID) | ORAL | 3 refills | Status: DC

## 2016-12-28 MED ORDER — RANITIDINE HCL 150 MG PO TABS: 150.0000 mg | ORAL_TABLET | Freq: Two times a day (BID) | ORAL | 3 refills | Status: DC

## 2016-12-28 NOTE — Assessment & Plan Note (Signed)
Assessment  Over the last 1 year he has developed clinically asymptomatic hypomagnesemia that has not responded to moderate dose MgO.  There are likely several contributors to the hypomagnesemia.  First, 1 year ago he was started on torsemide for his symptomatic chronic diastolic heart failure.  This has resulted in an improvement in his dyspnea, but is very likely contributing to his hypomagnesemia.  In addition, he has been on PPI therapy for his GERD, and in combination with diuretics is associated with hypomagnesemia.  He has a history of heavy drinking, but claims to have recently cut way back and will only drink occasionally.  Thus, this may not be a significant contributor to his hypomagnesemia.  Finally, he has constipation, not diarrhea, and therefore is not likely having significant GI losses of magnesium.  He claims to be compliant with his MgO 800 mg daily without GI upset or diarrhea.  Plan  As we can not stop the torsemide we will start amiloride 5 mg daily in hopes of preserving some of the magnesium lost in the urine secondary to the loop diuretic.  We will need to repeat a potassium level at the follow-up visit given the concomitant ARB.  We will stop the esomeprazole and start an H2-blocker for his symptomatic GERD.  Finally, we will increase the magnesium oxide to 800 mg PO BID (max dose).  This may actually help with his constipation.  If this is ineffective at repleting his magnesium we could switch to a sustained release formulation of magnesium in the future such as MgCl or Mg L-acetate and increase the amiloride dose to 10 mg daily.  Were this to be the case we would then look for excessive renal losses of magnesium with a FeMg.

## 2016-12-28 NOTE — Assessment & Plan Note (Signed)
He is up-to-date on his health care maintenance. 

## 2016-12-28 NOTE — Patient Instructions (Signed)
It was good to see you again.  Thank you for coming in so we could address your magnesium level.  1) Stop the esomeprazole (nexium) for the acid reflux.  I started ranitidine 150 mg twice a day in its place so you do not waste any more magnesium.  2) I started amiloride 5 mg in the morning.  This will help preserve the magnesium that is being lost by taking the torsemide.  3) Increase the Magnesium oxide to 800 mg (2 tablets) twice daily.  This should also help with your constipation.  I will see you in 1 month, sooner if necessary.

## 2016-12-28 NOTE — Assessment & Plan Note (Signed)
Assessment  His chronic diastolic heart failure is well compensated on the current regimen of three times weekly torsemide 40 mg, metoprolol 25 mg twice a day, and valsartan 160 mg daily.  It would not be prudent therefore to discontinue of lower the loop diuretic dose because of the hypomagnesemia.  Plan  We are adding amiloride 5 mg every morning to the regimen.  We will check a BMP in 1 month.

## 2016-12-28 NOTE — Assessment & Plan Note (Signed)
Assessment  He states he now drinks very infrequently.  Plan  He was praised for cutting back on his alcohol land this message was personalized to his chronic medical conditions as well as his acute hypomagnesemia.  We will reassess his success at minimizing his EtOH intake at the follow-up visit.

## 2016-12-28 NOTE — Assessment & Plan Note (Signed)
Assessment  He has noted some continued symptoms of his GERD likely related to his weight, despite the PPI therapy.  The PPI is contributing to the hypomagnesemia in the setting of the loop diuretic.  Plan  We will discontinue the esomeprazole and start ranitidine 150 mg twice daily.  We will reassess the efficacy of this therapy in controlling his symptoms at the follow-up appointment.

## 2016-12-28 NOTE — Assessment & Plan Note (Signed)
Assessment  At the last visit we stopped the Flonase for the vasomotor rhinitis as he did not feel it was helpful.  Since stopping it he has not had a recurrence of his vasomotor rhinitis.  Plan  Will follow his symptoms symptomatically, but it does not appear he requires therapy for his vasomotor rhinitis at this time.

## 2016-12-28 NOTE — Assessment & Plan Note (Signed)
Assessment  His blood pressure is at target on the current regimen but he requires the addition of the amiloride for the hypomagnesemia.  Plan  Add amiloride 5 mg each morning to his current antihypertensive regimen and he should receive even better afterload reduction.

## 2016-12-28 NOTE — Assessment & Plan Note (Signed)
Assessment  His constipation persists despite the MgO at 80- mg daily.  Plan  We have to increase the MgO to 800 mg twice daily to replete the magnesium.  It is hoped this may also help with his chronic constipation.  This will be reassessed at the follow-up appointment.

## 2016-12-28 NOTE — Progress Notes (Signed)
   Subjective:    Patient ID: Juan Calderon, male    DOB: 01-13-1956, 61 y.o.   MRN: [id number]  HPI  Juan Calderon is here for assessment and management of hypomagnesemia, gastroesophageal reflux disease, chronic diastolic heart failure, and allergic rhinits. Please see the A&P for the status of the pt's chronic medical problems.  Review of Systems  Constitutional: Negative for unexpected weight change.  HENT: Negative for congestion, postnasal drip, rhinorrhea, sinus pain and sinus pressure.   Respiratory: Negative for chest tightness and shortness of breath.   Cardiovascular: Positive for leg swelling. Negative for chest pain.  Gastrointestinal: Positive for constipation. Negative for diarrhea, nausea and vomiting.  Musculoskeletal: Positive for arthralgias and back pain.      Objective:   Physical Exam  Constitutional: He is oriented to person, place, and time. He appears well-developed and well-nourished. No distress.  HENT:  Head: Normocephalic and atraumatic.  Eyes: Conjunctivae are normal. Right eye exhibits no discharge. Left eye exhibits no discharge. No scleral icterus.  Neurological: He is alert and oriented to person, place, and time. He exhibits normal muscle tone.  Skin: He is not diaphoretic.  Psychiatric: He has a normal mood and affect. His behavior is normal. Judgment and thought content normal.  Nursing note and vitals reviewed.     Assessment & Plan:   Please see problem oriented charting.

## 2016-12-30 ENCOUNTER — Other Ambulatory Visit: Payer: Self-pay | Admitting: Internal Medicine

## 2017-01-02 ENCOUNTER — Ambulatory Visit (INDEPENDENT_AMBULATORY_CARE_PROVIDER_SITE_OTHER): Payer: Medicare Other | Admitting: *Deleted

## 2017-01-02 DIAGNOSIS — I25119 Atherosclerotic heart disease of native coronary artery with unspecified angina pectoris: Secondary | ICD-10-CM

## 2017-01-02 DIAGNOSIS — Z5181 Encounter for therapeutic drug level monitoring: Secondary | ICD-10-CM | POA: Diagnosis not present

## 2017-01-02 LAB — POCT INR: INR: 1.9

## 2017-01-16 ENCOUNTER — Ambulatory Visit (INDEPENDENT_AMBULATORY_CARE_PROVIDER_SITE_OTHER): Payer: Medicare Other | Admitting: Pharmacist

## 2017-01-16 DIAGNOSIS — Z5181 Encounter for therapeutic drug level monitoring: Secondary | ICD-10-CM

## 2017-01-16 DIAGNOSIS — I25119 Atherosclerotic heart disease of native coronary artery with unspecified angina pectoris: Secondary | ICD-10-CM | POA: Diagnosis not present

## 2017-01-16 LAB — POCT INR: INR: 3.4

## 2017-01-18 ENCOUNTER — Other Ambulatory Visit: Payer: Self-pay | Admitting: Internal Medicine

## 2017-01-25 ENCOUNTER — Ambulatory Visit (INDEPENDENT_AMBULATORY_CARE_PROVIDER_SITE_OTHER): Payer: Medicare Other | Admitting: Internal Medicine

## 2017-01-25 ENCOUNTER — Encounter: Payer: Self-pay | Admitting: Internal Medicine

## 2017-01-25 DIAGNOSIS — N521 Erectile dysfunction due to diseases classified elsewhere: Secondary | ICD-10-CM | POA: Diagnosis not present

## 2017-01-25 DIAGNOSIS — E1140 Type 2 diabetes mellitus with diabetic neuropathy, unspecified: Secondary | ICD-10-CM

## 2017-01-25 DIAGNOSIS — E1149 Type 2 diabetes mellitus with other diabetic neurological complication: Secondary | ICD-10-CM | POA: Diagnosis not present

## 2017-01-25 DIAGNOSIS — G894 Chronic pain syndrome: Secondary | ICD-10-CM

## 2017-01-25 DIAGNOSIS — M47812 Spondylosis without myelopathy or radiculopathy, cervical region: Secondary | ICD-10-CM | POA: Diagnosis not present

## 2017-01-25 DIAGNOSIS — Z7984 Long term (current) use of oral hypoglycemic drugs: Secondary | ICD-10-CM

## 2017-01-25 DIAGNOSIS — K219 Gastro-esophageal reflux disease without esophagitis: Secondary | ICD-10-CM

## 2017-01-25 DIAGNOSIS — I10 Essential (primary) hypertension: Secondary | ICD-10-CM | POA: Diagnosis not present

## 2017-01-25 DIAGNOSIS — E669 Obesity, unspecified: Secondary | ICD-10-CM | POA: Diagnosis not present

## 2017-01-25 MED ORDER — SILDENAFIL CITRATE 100 MG PO TABS: 100.0000 mg | ORAL_TABLET | Freq: Every day | ORAL | 11 refills | Status: DC | PRN

## 2017-01-25 MED ORDER — OXYCODONE-ACETAMINOPHEN 10-325 MG PO TABS: 1.0000 | ORAL_TABLET | Freq: Four times a day (QID) | ORAL | 0 refills | Status: DC | PRN

## 2017-01-25 NOTE — Assessment & Plan Note (Signed)
Assessment  His chronic pain syndrome is unchanged on the current dose of Percocet which allows him to be functional.  Plan  Percocet was renewed and he was given 3 written prescriptions which should get him through the refill in September. We will reassess the efficacy of the Percocet in controlling his pain and allowing functionality at the follow-up visit.

## 2017-01-25 NOTE — Assessment & Plan Note (Signed)
Assessment  He notes over the last 2 weeks he's had some stiffness in his neck once again. At this point it does not radiate down the right arm as before. He was offered NSAID therapy but was not interested.  Plan  We will continue with conservative measures including the Percocet that is prescribed for his chronic pain syndrome and not add anything for this. I fully expect it will resolve in the next few days. This will be reassessed at the follow-up visit.

## 2017-01-25 NOTE — Assessment & Plan Note (Signed)
Assessment  As his hemoglobin A1c was 6.2 two months ago this was not reassessed. He is tolerating the metformin 1000 mg by mouth twice daily well. I suspect his glycemic control is still very good. The issue today was the erectile dysfunction. More specifically, his lack of desire for intimacy. He has a partner who is much younger than him and is starting to ask questions as to why he is not interested in intimacy. He has a history of hypogonadism and was previously given AndroGel which was ineffective. This replaced testosterone IM injections which were stopped because of concern over side effects. I reviewed the side effects once again and made sure he understood how risky IM testosterone would be in him, especially given his underlying untreated obstructive sleep apnea. He decided against testosterone replacement given this information.  Plan  We will continue the metformin at 1000 mg by mouth twice daily. He was willing to try Viagra 100 mg as needed when necessary. He was prescribed 5 tablets given the cost. He was reminded not to take his sublingual nitroglycerin within 24 hours of taking Viagra. He assures me it is been a considerably long time since he has required any sublingual nitroglycerin. We will reassess the efficacy of the Viagra at the follow-up visit. I am concerned this will have no impact upon his desire for intimacy.

## 2017-01-25 NOTE — Assessment & Plan Note (Signed)
Assessment  He is been tolerating the amiloride well and has had an improvement in his constipation on the magnesium oxide 800 mg twice daily. He has remained off the PPI therapy. A magnesium level was obtained today and is pending at the time of this dictation.  Plan  We will continue the magnesium oxide at 800 mg by mouth twice daily. We will also continue the amiloride at 50 mg by mouth daily although we can increase this if he remains hypomagnesemic. We will follow-up on the results of the magnesium level drawn today and make appropriate changes.

## 2017-01-25 NOTE — Progress Notes (Signed)
   Subjective:    Patient ID: Juan Calderon, male    DOB: Jan 15, 1956, 61 y.o.   MRN: [id number]  HPI  Juan Calderon is here for follow-up of his hypomagnesemia, gastroesophageal reflux disease, diabetes, obesity, and chronic pain. Please see the A&P for the status of the pt's chronic medical problems.  Review of Systems  Constitutional: Negative for activity change, appetite change and unexpected weight change.  Respiratory: Negative for chest tightness and wheezing.   Cardiovascular: Positive for leg swelling. Negative for chest pain.  Gastrointestinal: Positive for nausea. Negative for abdominal pain, constipation, diarrhea and vomiting.       Nausea for the past 3 days  Musculoskeletal: Positive for arthralgias, back pain, gait problem, myalgias and neck stiffness. Negative for joint swelling.  Neurological: Negative for dizziness, syncope and light-headedness.      Objective:   Physical Exam  Constitutional: He is oriented to person, place, and time. He appears well-developed and well-nourished. No distress.  HENT:  Head: Normocephalic and atraumatic.  Eyes: Conjunctivae are normal. Right eye exhibits no discharge. Left eye exhibits no discharge. No scleral icterus.  Neck: Normal range of motion. Neck supple.  Cardiovascular: Normal rate, regular rhythm and normal heart sounds.  Exam reveals no gallop and no friction rub.   No murmur heard. Pulmonary/Chest: Effort normal and breath sounds normal. No respiratory distress. He has no wheezes. He has no rales.  Abdominal: Soft. Bowel sounds are normal. He exhibits no distension. There is no tenderness. There is no rebound and no guarding.  Musculoskeletal: Normal range of motion. He exhibits edema and deformity. He exhibits no tenderness.  Neurological: He is alert and oriented to person, place, and time. He exhibits normal muscle tone.  Skin: Skin is warm and dry. He is not diaphoretic.  Psychiatric: He has a normal mood and  affect. His behavior is normal. Judgment and thought content normal.  Nursing note and vitals reviewed.     Assessment & Plan:   Please see problem oriented charting.

## 2017-01-25 NOTE — Assessment & Plan Note (Signed)
Assessment  His acid reflux has worsened since stopping the PPI and starting the ranitidine. I offered him the alternative of sucralfate but he was not interested. Instead he would rather try over-the-counter Maalox.  Plan  The ranitidine was discontinued and he will remain off PPI therapy while hypomagnesemic and on loop diuretics. He was advised to try Maalox as needed for gastroesophageal reflux disease symptoms. We will reassess the degree of his symptomatology and the efficacy of this when necessary therapy at the follow-up visit.

## 2017-01-25 NOTE — Patient Instructions (Addendum)
It was good to see you again.  I am sorry the ranitidine has not been working, you have been nauseated, and your neck has been acting up again.  I am hopeful the nausea and neck improves in a few days.  1) Please stop the ranitidine since it is not working.  Consider trying Maalox as needed for heartburn.  Continued weight loss will also help.  2) Great job on loosing the 6 pounds!  That is not easy but it will help many things with your health.  3) I will call you early next week when I get the results of your blood test including the magnesium.  4) Call me if you need something for the nausea or neck pain if they do not get better.  5) I gave you an additional three percocet prescriptions that will get you through mid-October.  I will see you back in 4 months, sooner if necessary.

## 2017-01-25 NOTE — Assessment & Plan Note (Signed)
Assessment  He has lost 6 pounds in the last 2 months. This continues to be a result of portion control. He was praised for this success.  Plan  He was encouraged to continue his efforts to lose weight given his obesity's impact on several of his chronic medical conditions. We will reassess the success of these efforts at the follow-up visit.

## 2017-01-26 ENCOUNTER — Emergency Department (HOSPITAL_COMMUNITY)
Admission: EM | Admit: 2017-01-26 | Discharge: 2017-01-26 | Disposition: A | Discharge status: Home / Self Care | Payer: Medicare Other | Attending: Emergency Medicine | Admitting: Emergency Medicine

## 2017-01-26 ENCOUNTER — Other Ambulatory Visit: Payer: Self-pay

## 2017-01-26 ENCOUNTER — Telehealth: Payer: Self-pay | Admitting: Internal Medicine

## 2017-01-26 ENCOUNTER — Encounter (HOSPITAL_COMMUNITY): Payer: Self-pay | Admitting: *Deleted

## 2017-01-26 ENCOUNTER — Emergency Department (HOSPITAL_COMMUNITY): Payer: Medicare Other

## 2017-01-26 DIAGNOSIS — N179 Acute kidney failure, unspecified: Secondary | ICD-10-CM

## 2017-01-26 DIAGNOSIS — E875 Hyperkalemia: Secondary | ICD-10-CM

## 2017-01-26 DIAGNOSIS — R079 Chest pain, unspecified: Secondary | ICD-10-CM | POA: Diagnosis not present

## 2017-01-26 DIAGNOSIS — Z7901 Long term (current) use of anticoagulants: Secondary | ICD-10-CM | POA: Insufficient documentation

## 2017-01-26 DIAGNOSIS — R001 Bradycardia, unspecified: Secondary | ICD-10-CM

## 2017-01-26 DIAGNOSIS — I2510 Atherosclerotic heart disease of native coronary artery without angina pectoris: Secondary | ICD-10-CM | POA: Insufficient documentation

## 2017-01-26 DIAGNOSIS — E1140 Type 2 diabetes mellitus with diabetic neuropathy, unspecified: Secondary | ICD-10-CM | POA: Diagnosis not present

## 2017-01-26 DIAGNOSIS — I5032 Chronic diastolic (congestive) heart failure: Secondary | ICD-10-CM | POA: Diagnosis not present

## 2017-01-26 DIAGNOSIS — E039 Hypothyroidism, unspecified: Secondary | ICD-10-CM | POA: Insufficient documentation

## 2017-01-26 DIAGNOSIS — N189 Chronic kidney disease, unspecified: Secondary | ICD-10-CM | POA: Diagnosis not present

## 2017-01-26 DIAGNOSIS — Z7984 Long term (current) use of oral hypoglycemic drugs: Secondary | ICD-10-CM | POA: Insufficient documentation

## 2017-01-26 DIAGNOSIS — Z8249 Family history of ischemic heart disease and other diseases of the circulatory system: Secondary | ICD-10-CM

## 2017-01-26 DIAGNOSIS — I130 Hypertensive heart and chronic kidney disease with heart failure and stage 1 through stage 4 chronic kidney disease, or unspecified chronic kidney disease: Secondary | ICD-10-CM | POA: Insufficient documentation

## 2017-01-26 DIAGNOSIS — J449 Chronic obstructive pulmonary disease, unspecified: Secondary | ICD-10-CM | POA: Diagnosis not present

## 2017-01-26 DIAGNOSIS — R799 Abnormal finding of blood chemistry, unspecified: Secondary | ICD-10-CM | POA: Diagnosis present

## 2017-01-26 LAB — CBC
HCT: 41.3 % (ref 39.0–52.0)
HEMOGLOBIN: 13 g/dL (ref 13.0–17.0)
MCH: 29.1 pg (ref 26.0–34.0)
MCHC: 31.5 g/dL (ref 30.0–36.0)
MCV: 92.6 fL (ref 78.0–100.0)
Platelets: 187 10*3/uL (ref 150–400)
RBC: 4.46 MIL/uL (ref 4.22–5.81)
RDW: 15.1 % (ref 11.5–15.5)
WBC: 7 10*3/uL (ref 4.0–10.5)

## 2017-01-26 LAB — BASIC METABOLIC PANEL
Anion gap: 7 (ref 5–15)
Anion gap: 7 (ref 5–15)
Anion gap: 7 (ref 5–15)
BUN: 38 mg/dL — ABNORMAL HIGH (ref 6–20)
BUN: 35 mg/dL — ABNORMAL HIGH (ref 6–20)
BUN: 36 mg/dL — ABNORMAL HIGH (ref 6–20)
CALCIUM: 9.4 mg/dL (ref 8.9–10.3)
CO2: 21 mmol/L — ABNORMAL LOW (ref 22–32)
CO2: 24 mmol/L (ref 22–32)
CO2: 24 mmol/L (ref 22–32)
CREATININE: 1.36 mg/dL — AB (ref 0.61–1.24)
Calcium: 9.6 mg/dL (ref 8.9–10.3)
Calcium: 9.7 mg/dL (ref 8.9–10.3)
Chloride: 107 mmol/L (ref 101–111)
Chloride: 106 mmol/L (ref 101–111)
Chloride: 108 mmol/L (ref 101–111)
Creatinine, Ser: 1.53 mg/dL — ABNORMAL HIGH (ref 0.61–1.24)
Creatinine, Ser: 1.47 mg/dL — ABNORMAL HIGH (ref 0.61–1.24)
GFR calc Af Amer: 55 mL/min — ABNORMAL LOW (ref >60)
GFR calc Af Amer: 57 mL/min — ABNORMAL LOW (ref >60)
GFR calc non Af Amer: 47 mL/min — ABNORMAL LOW (ref >60)
GFR calc non Af Amer: 49 mL/min — ABNORMAL LOW (ref >60)
GFR calc non Af Amer: 54 mL/min — ABNORMAL LOW (ref >60)
Glucose, Bld: 133 mg/dL — ABNORMAL HIGH (ref 65–99)
Glucose, Bld: 148 mg/dL — ABNORMAL HIGH (ref 65–99)
Glucose, Bld: 88 mg/dL (ref 65–99)
Potassium: 6.2 mmol/L — ABNORMAL HIGH (ref 3.5–5.1)
Potassium: 5.8 mmol/L — ABNORMAL HIGH (ref 3.5–5.1)
Potassium: 5.1 mmol/L (ref 3.5–5.1)
SODIUM: 139 mmol/L (ref 135–145)
Sodium: 135 mmol/L (ref 135–145)
Sodium: 137 mmol/L (ref 135–145)

## 2017-01-26 LAB — URINALYSIS, DIPSTICK ONLY
Bilirubin Urine: NEGATIVE
Glucose, UA: NEGATIVE mg/dL
Hgb urine dipstick: NEGATIVE
Ketones, ur: NEGATIVE mg/dL
Leukocytes, UA: NEGATIVE
Nitrite: NEGATIVE
Protein, ur: NEGATIVE mg/dL
Specific Gravity, Urine: 1.009 (ref 1.005–1.030)
pH: 5 (ref 5.0–8.0)

## 2017-01-26 LAB — BMP8+ANION GAP
ANION GAP: 14 mmol/L (ref 10.0–18.0)
BUN/Creatinine Ratio: 23 (ref 10–24)
BUN: 38 mg/dL — AB (ref 8–27)
CHLORIDE: 105 mmol/L (ref 96–106)
CO2: 20 mmol/L (ref 18–29)
CREATININE: 1.62 mg/dL — AB (ref 0.76–1.27)
Calcium: 9.6 mg/dL (ref 8.6–10.2)
GFR calc Af Amer: 52 mL/min/{1.73_m2} — ABNORMAL LOW (ref >59)
GFR calc non Af Amer: 45 mL/min/{1.73_m2} — ABNORMAL LOW (ref >59)
GLUCOSE: 136 mg/dL — AB (ref 65–99)
Potassium: 6.7 mmol/L (ref 3.5–5.2)
Sodium: 139 mmol/L (ref 134–144)

## 2017-01-26 LAB — NA AND K (SODIUM & POTASSIUM), RAND UR
Potassium Urine: 13 mmol/L
Sodium, Ur: 131 mmol/L

## 2017-01-26 LAB — I-STAT TROPONIN, ED: Troponin i, poc: 0.01 ng/mL (ref 0.00–0.08)

## 2017-01-26 LAB — PROTIME-INR
INR: 1.7
Prothrombin Time: 20.2 seconds — ABNORMAL HIGH (ref 11.4–15.2)

## 2017-01-26 LAB — MAGNESIUM: MAGNESIUM: 2.1 mg/dL (ref 1.6–2.3)

## 2017-01-26 MED ORDER — WARFARIN SODIUM 2.5 MG PO TABS: 2.5000 mg | ORAL_TABLET | ORAL | Status: DC

## 2017-01-26 MED ORDER — METFORMIN HCL 500 MG PO TABS
1000.0000 mg | ORAL_TABLET | Freq: Two times a day (BID) | ORAL | Status: DC
  Administered 2017-01-26: 1000 mg via ORAL
  Filled 2017-01-26: qty 2

## 2017-01-26 MED ORDER — SODIUM POLYSTYRENE SULFONATE 15 GM/60ML PO SUSP
15.0000 g | Freq: Once | ORAL | Status: AC
  Administered 2017-01-26: 15 g via ORAL
  Filled 2017-01-26: qty 60

## 2017-01-26 MED ORDER — TERAZOSIN HCL 5 MG PO CAPS: 5.0000 mg | ORAL_CAPSULE | Freq: Every day | ORAL | Status: DC

## 2017-01-26 MED ORDER — OXYCODONE-ACETAMINOPHEN 10-325 MG PO TABS: 1.0000 | ORAL_TABLET | Freq: Four times a day (QID) | ORAL | Status: DC | PRN

## 2017-01-26 MED ORDER — FUROSEMIDE 10 MG/ML IJ SOLN
40.0000 mg | Freq: Once | INTRAMUSCULAR | Status: AC
  Administered 2017-01-26: 40 mg via INTRAVENOUS
  Filled 2017-01-26: qty 4

## 2017-01-26 MED ORDER — FLECAINIDE ACETATE 100 MG PO TABS
100.0000 mg | ORAL_TABLET | Freq: Two times a day (BID) | ORAL | Status: DC
  Administered 2017-01-26: 100 mg via ORAL
  Filled 2017-01-26: qty 1

## 2017-01-26 MED ORDER — MAGNESIUM OXIDE 400 MG PO TABS: 800.0000 mg | ORAL_TABLET | Freq: Two times a day (BID) | ORAL | Status: DC

## 2017-01-26 MED ORDER — OXYCODONE-ACETAMINOPHEN 5-325 MG PO TABS: 1.0000 | ORAL_TABLET | Freq: Four times a day (QID) | ORAL | Status: DC | PRN

## 2017-01-26 MED ORDER — SODIUM CHLORIDE 0.9 % IV BOLUS (SEPSIS)
500.0000 mL | Freq: Once | INTRAVENOUS | Status: AC
  Administered 2017-01-26: 500 mL via INTRAVENOUS

## 2017-01-26 MED ORDER — WARFARIN - PHYSICIAN DOSING INPATIENT: Freq: Every day | Status: DC

## 2017-01-26 MED ORDER — ATORVASTATIN CALCIUM 20 MG PO TABS
20.0000 mg | ORAL_TABLET | Freq: Every day | ORAL | Status: DC
  Administered 2017-01-26: 20 mg via ORAL
  Filled 2017-01-26: qty 1

## 2017-01-26 MED ORDER — LATANOPROST 0.005 % OP SOLN: 1.0000 [drp] | Freq: Every day | OPHTHALMIC | Status: DC

## 2017-01-26 MED ORDER — SODIUM POLYSTYRENE SULFONATE 15 GM/60ML PO SUSP
15.0000 g | Freq: Once | ORAL | Status: AC
  Administered 2017-01-26: 15 g via ORAL
  Filled 2017-01-26 (×2): qty 60

## 2017-01-26 MED ORDER — WARFARIN SODIUM 5 MG PO TABS
5.0000 mg | ORAL_TABLET | ORAL | Status: DC
  Administered 2017-01-26: 5 mg via ORAL
  Filled 2017-01-26: qty 1

## 2017-01-26 MED ORDER — OXYCODONE HCL 5 MG PO TABS: 5.0000 mg | ORAL_TABLET | Freq: Four times a day (QID) | ORAL | Status: DC | PRN

## 2017-01-26 MED ORDER — FAMOTIDINE 20 MG PO TABS: 20.0000 mg | ORAL_TABLET | Freq: Every day | ORAL | Status: DC

## 2017-01-26 MED ORDER — ALBUTEROL SULFATE (2.5 MG/3ML) 0.083% IN NEBU: 3.0000 mL | INHALATION_SOLUTION | Freq: Four times a day (QID) | RESPIRATORY_TRACT | Status: DC | PRN

## 2017-01-26 MED ORDER — ALLOPURINOL 300 MG PO TABS: 300.0000 mg | ORAL_TABLET | Freq: Every day | ORAL | Status: DC

## 2017-01-26 MED ORDER — MAGNESIUM OXIDE 400 (241.3 MG) MG PO TABS
800.0000 mg | ORAL_TABLET | Freq: Two times a day (BID) | ORAL | Status: DC
  Administered 2017-01-26: 800 mg via ORAL
  Filled 2017-01-26: qty 2

## 2017-01-26 NOTE — ED Notes (Signed)
Pt ambulated to restroom independently and had bowel movement.

## 2017-01-26 NOTE — ED Provider Notes (Signed)
[facility] DEPT Provider Note   CSN: [id number] Arrival date & time: 01/26/17  3154     History   Chief Complaint Chief Complaint  Patient presents with  . Abnormal Lab  . Chest Pain    HPI Juan Calderon is a 61 y.o. male.  HPI 61 year old man history of congestive heart failure, COPD, hypertension, atrial fibrillation on chronic anticoagulation presents today after being called back by his clinic. He was seen in the internal medicine outpatient clinic yesterday for a checkup. Labs were drawn. They were called during the night and noted that the potassium was elevated. Patient was called and asked to return for reevaluation to this. Potassium was 6.7. Patient has elevated creatinine 1.62 history of dialysis. He reports no complaints. He has been eating and drinking as usual and urinating as usual. Past Medical History:  Diagnosis Date  . Alcohol abuse    . C6 radiculopathy 01/24/2016   Right upper extremity, mild to moderate electrically by EMG on 01/24/2016  . Cataract    Left eye  . Chronic diastolic heart failure ([place])     with mild left ventricular hypertrophy on Echo 02/2010  . Chronic obstructive pulmonary disease ([place])    . Chronic osteomyelitis of femur ([place]) 04/06/2016  . Chronic pain syndrome     Left arm and leg s/p traumatic injury   . Chronic renal insufficiency    . Coronary artery disease     25% LAD stenosis on cath 2007.  Stable angina.  . Diverticulosis    . Essential hypertension    . Gastroesophageal reflux disease    . Gout    . Hyperlipidemia LDL goal < 100    . Long-term current use of opiate analgesic 09/07/2016  . Mild carpal tunnel syndrome of right wrist 01/24/2016   Mild degree electrically per EMG 01/24/2016   . Morbid obesity with BMI of 40.0-44.9, adult ([place])    . Normocytic anemia    . Obstructive sleep apnea     Moderate, AHI 29.8 per hour with moderately loud snoring and oxygen desaturation to a nadir of 79%. CPAP titration resulted in a  prescription for 17 CWP.    [name] Open-angle glaucoma    . Osteoarthritis cervical spine    . Osteoarthritis of left knee 06/19/2013   Tricompartmental disease.  Treated with double hinged upright knee brace, steroid/xylocaine knee injections, and NSAIDs   . Paroxysmal atrial fibrillation ([place]) 10/25/2015  . Persistent atrial fibrillation ([place])    . Right rotator cuff tear     Large full-thickness tear of the supraspinatus with mild retraction but no atrophy   . Subclinical hypothyroidism    . 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 Hypomagnesemia 12/28/2016  . Long-term current use of opiate analgesic 09/07/2016  . Mild carpal tunnel syndrome of right wrist 01/24/2016  . C6 radiculopathy 01/24/2016  . Paroxysmal atrial fibrillation ([place]) 10/25/2015  . Alcohol abuse 02/28/2015  . Constipation due to opioid therapy 12/24/2014  . Venous stasis ulcer of left lower extremity ([place]) 08/06/2014  . Encounter for therapeutic drug monitoring 11/16/2013  . Diverticulosis 11/12/2013  . Post-traumatic osteoarthritis of left knee 06/19/2013  . Obstructive sleep apnea 06/01/2013  . Osteoarthritis cervical spine 04/25/2013  . Gastroesophageal reflux disease without esophagitis 04/25/2013  . Vasomotor rhinitis 04/25/2013  . Open-angle glaucoma 04/25/2013  . Hyperlipidemia 04/25/2013  . Type II diabetes  mellitus with neuropathy causing erectile dysfunction ([place]) 04/25/2013  . Coronary artery disease involving native coronary artery with angina pectoris ([place]) 04/25/2013  . Chronic asthmatic bronchitis ([place]) 04/25/2013  . Idiopathic chronic gout without tophus 04/25/2013  . Obesity with body mass index (BMI) of 35.0 to 39.9 without comorbidity 04/25/2013  . Right rotator cuff tear 04/25/2013  . Healthcare maintenance 01/15/2013  . Chronic pain syndrome 01/15/2013  . Chronic diastolic heart failure ([place])  02/04/2012  . Essential hypertension 09/20/2011    Past Surgical History:  Procedure Laterality Date  . CARDIOVERSION N/A 12/30/2014   Procedure: CARDIOVERSION;  Surgeon: [name], MD;  Location: [facility] ENDOSCOPY;  Service: Cardiovascular;  Laterality: N/A;  . FRACTURE SURGERY Left 1980's   Elbow  . Left arm surgery    . Left leg surgery    . SHOULDER SURGERY     Right       Home Medications    Prior to Admission medications   Medication Sig Start Date End Date Taking? Authorizing Provider  albuterol (PROVENTIL HFA;VENTOLIN HFA) 108 (90 Base) MCG/ACT inhaler Inhale 2 puffs into the lungs every 6 (six) hours as needed for wheezing or shortness of breath. 10/17/16   Milagros Loll, MD  allopurinol (ZYLOPRIM) 300 MG tablet Take 1 tablet (300 mg total) by mouth daily. 05/24/16   Oval Linsey, MD  aMILoride (MIDAMOR) 5 MG tablet Take 1 tablet (5 mg total) by mouth daily. 12/28/16   Oval Linsey, MD  atorvastatin (LIPITOR) 20 MG tablet Take 1 tablet (20 mg total) by mouth daily. 06/29/16   Oval Linsey, MD  Blood Glucose Monitoring Suppl [facility] Series VERIO) w/Device KIT 1 each by Does not apply route daily. 11/04/15   Loleta Chance, MD  Dextromethorphan-Guaifenesin 20-400 MG/5ML SYRP Take 5 mLs by mouth every 4 (four) hours as needed (cough). 10/17/16   Milagros Loll, MD  flecainide (TAMBOCOR) 100 MG tablet Take 1 tablet (100 mg total) by mouth every 12 (twelve) hours. 12/20/16   Oval Linsey, MD  glucose blood (ONETOUCH VERIO) test strip Use as instructed 05/24/16   Oval Linsey, MD  latanoprost (XALATAN) 0.005 % ophthalmic solution Place 1 drop into both eyes at bedtime. 10/11/16   Oval Linsey, MD  magnesium oxide (MAG-OX) 400 MG tablet Take 2 tablets (800 mg total) by mouth 2 (two) times daily. 12/28/16   Oval Linsey, MD  metFORMIN (GLUCOPHAGE) 1000 MG tablet Take 1 tablet (1,000 mg total) by mouth 2 (two) times daily with a meal. 12/20/16   Oval Linsey, MD    metoprolol tartrate (LOPRESSOR) 25 MG tablet Take 1 tablet (25 mg total) by mouth 2 (two) times daily. 06/29/16   Oval Linsey, MD  nitroGLYCERIN (NITROSTAT) 0.4 MG SL tablet Place 1 tablet (0.4 mg total) under the tongue every 5 (five) minutes as needed for chest pain. 06/10/15   Oval Linsey, MD  [facility] DELICA LANCETS 34L MISC Use 1 strip daily 05/24/16   Oval Linsey, MD  oxyCODONE-acetaminophen (PERCOCET) 10-325 MG tablet Take 1 tablet by mouth every 6 (six) hours as needed for pain. 01/25/17   Oval Linsey, MD  ranitidine (ZANTAC) 150 MG tablet Take 1 tablet (150 mg total) by mouth 2 (two) times daily. 12/28/16   Oval Linsey, MD  sildenafil (VIAGRA) 100 MG tablet Take 1 tablet (100 mg total) by mouth daily as needed for erectile dysfunction. 01/25/17   Oval Linsey, MD  terazosin (HYTRIN) 5 MG capsule Take 1 capsule (5 mg total) by mouth at bedtime.  05/03/16   Oval Linsey, MD  torsemide (DEMADEX) 20 MG tablet Take 2 tablets three times weekly 12/23/15   Oval Linsey, MD  valsartan (DIOVAN) 160 MG tablet Take 1 tablet (160 mg total) by mouth daily. 08/22/16   Oval Linsey, MD  warfarin (COUMADIN) 5 MG tablet TAKE AS DIRECTED PER COUMADIN CLINIC 12/07/16   Burnell Blanks, MD    Family History Family History  Problem Relation Age of Onset  . Heart failure Mother   . Alzheimer's disease Father   . Heart failure Brother   . Osteoarthritis Brother   . Prostate cancer Brother   . Early death Brother        Manufacturing systems engineer  . Hypertension Sister   . Hypertension Brother   . Heart attack Neg Hx   . Stroke Neg Hx     Social History Social History  Substance Use Topics  . Smoking status: Never Smoker  . Smokeless tobacco: Never Used  . Alcohol use Yes     Allergies   Ramipril and Testosterone   Review of Systems Review of Systems   Physical Exam Updated Vital Signs BP 122/64 (BP Location: Left Arm)   Pulse (!) 48   Temp 97.9 F (36.6 C)  (Oral)   Resp 18   Ht 1.727 m (5' 8" )   Wt 133.7 kg (294 lb 12.8 oz)   SpO2 97%   BMI 44.82 kg/m   Physical Exam  Constitutional: He is oriented to person, place, and time. He appears well-developed and well-nourished.  HENT:  Head: Normocephalic and atraumatic.  Eyes: EOM are normal. Pupils are equal, round, and reactive to light.  Neck: Normal range of motion. Neck supple.  Cardiovascular: Bradycardia present.   Pulmonary/Chest: Effort normal and breath sounds normal.  Abdominal: Soft. Bowel sounds are normal.  Musculoskeletal: Normal range of motion.  Neurological: He is alert and oriented to person, place, and time.  Skin: Skin is warm.  Psychiatric: He has a normal mood and affect.  Nursing note and vitals reviewed.    ED Treatments / Results  Labs (all labs ordered are listed, but only abnormal results are displayed) Labs Reviewed  BASIC METABOLIC PANEL  [place], ED    EKG  EKG Interpretation  Date/Time:  Saturday Jan 26 2017 09:42:29 EDT Ventricular Rate:  47 PR Interval:  196 QRS Duration: 108 QT Interval:  472 QTC Calculation: 417 R Axis:   42 Text Interpretation:  Sinus bradycardia rate has decreased Confirmed by Vonetta Foulk MD, Andee Poles ([id number]) on 01/26/2017 9:43:52 AM Also confirmed by Simmie Garin MD, Andee Poles [id number]), editor Drema Pry ([id number])  on 01/26/2017 9:46:31 AM       Radiology No results found.  Procedures Procedures (including critical care time)  Medications Ordered in ED Medications - No data to display   Initial Impression / Assessment and Plan / ED Course  I have reviewed the triage vital signs and the nursing notes.  Pertinent labs & imaging results that were available during my care of the patient were reviewed by me and considered in my medical decision making (see chart for details).     61 year old man presents today with hypokalemia. EKG without evidence of hyperkalemic acute changes. Potassium has decreased from  6.7 6.2. Patient's creatinine is elevated from baseline in January of 0.88. Today it is 1.53. Per nursing notes patient had some complaints of chest pain but denied chest pain on my valuation. EKG without acute ischemic changes and troponin normal.  Patient is chronically chronic anticoagulation for atrial fibrillation. Today is a sinus bradycardia. However, he is not adequately anticoagulated with INR of 1.7  1-hyperkalemia- patient has received IV fluids, Lasix, and Kayexalate. 2 renal insufficiency creatinine is elevated from baseline of 0.88-1.5. Chest pain and EKG without acutely ischemic changes and troponin is normal 4 chronic atrial fibrillation patient is on Coumadin but is not adequately anticoagulated Discussed with resident on call for internal medicine. They state they will recheck potassium at 3:00 4:24 PM Repeat potassium was 5.8. Restasis was patient. He states that he does not want to wait any longer. I agree patient her medicine. Discussed with Dr. Marlowe Sax and she will disposition after potassium at 1830 Final Clinical Impressions(s) / ED Diagnoses  Hyperkalemia aki New Prescriptions New Prescriptions   No medications on file     Pattricia Boss, MD 01/26/17 [phone]

## 2017-01-26 NOTE — ED Notes (Signed)
Lasix and kayexelate given, pt has urinal and bedside commode at bedside. Call light in reach and pt instructed to use when needed to use the restroom.

## 2017-01-26 NOTE — Telephone Encounter (Signed)
I was informed by night team that [facility] called stating Juan Calderon's labs drawn yesterday came back showing K >6.7. Night team left the patient a voice mail. I started my shift at 7 am and tried calling the patient back right away. Unfortunately I was not able to reach him over the phone and left a voicemail. I tried another contact number listed in the chart, his son Ms. Moshe Cipro picked up the phone and informed me he does not live with his father. Son stated he will try to contact the patient. I requested him to ask the patient to call us back.

## 2017-01-26 NOTE — Discharge Instructions (Signed)
STOP taking Amiloride

## 2017-01-26 NOTE — ED Notes (Signed)
Md paged to notify that BMET is resulted.

## 2017-01-26 NOTE — ED Notes (Signed)
Dr Marlowe Sax paged to  Notify that blood results are back.

## 2017-01-26 NOTE — ED Notes (Signed)
Pt sitting up at bedside. Pt has no used restroom again since last dose of kayexelate

## 2017-01-26 NOTE — ED Triage Notes (Signed)
Pt reports going to pcp for checkup recently, was called and told to come here due to high potassium. Denies renal hx or dialysis. Pt reports no pain or complaints but family states he has recent intermittent chest discomfort.

## 2017-01-26 NOTE — Telephone Encounter (Signed)
Update: Patient called back at 8:31 am. I informed him about K >6.7 (see my previous note). Advised him to hold Valsartan and come into the ED immediately for repeat labs. Patient agreed.

## 2017-01-26 NOTE — Progress Notes (Signed)
Date: 01/26/2017  Patient name: Juan Calderon  Medical record number: [id number]  Date of birth: 20-Jun-1956   I have seen and evaluated Juan Calderon and discussed their care with the Residency Team. Juan Calderon is a 61 year old man who came to the ED after receiving a phone call from our medical team about his blood test results. At his April 27 appointment with Dr. Eppie Gibson, it was noted that he had developed asymptomatic hypomagnesemia which had not responded to oral magnesium. This is thought to be multi factorial including PPI therapy, torsemide therapy, and alcohol usage. At that appointment he was started on amiloride 5 mg which would help his hypomagnesemia, further decrease his blood pressure, and help with afterload reduction to improve his heart failure. It was recognized that the combination of the medicine and his ARB might cause hyperkalemia and he was scheduled to come back in one month for a EMP. He returned on May 27 and his BMP resulted on the 28th with a creatinine of 6.7 and a Cr of 1.62 which is up from prior of 0.88, although his baseline seems to be 1.0 - 1.3. Of note, his blood pressure had decreased on May 27 to 100/60 whereas previously it had always been in the 130-140/80 range. Today Juan Calderon states that for the past 3 days he has had nausea, dizziness, and feeling like he was going to faint. Despite the nausea, he has continued to eat and drink normally. He complains of esophageal dysphagia but that has not changed in the past several years. He denies chest pain or shortness of breath.   In the ED, he had an EKG which did not show peaked T waves. His repeat BMP showed a potassium of 6.2 and a creatinine of 1.53. He was given 15 of Kayexalate and his potassium came down to 5.8. In the ED, he feels back to baseline with resolution of his nausea, dizziness, and fainting feeling. He very much desires not to be admitted.  PMHx : Significant for diastolic heart failure, hypertension,  obesity, chronic pain requiring opioids, hypomagnesemia, atrial fibrillation, GERD, and diabetes with diabetic nephropathy.  Fam Hx : There is heart disease in his mother and his brother has heart failure.   Soc Hx : He has a significant other that they are not married. He does drink alcohol  Vitals:   01/26/17 1515 01/26/17 1530  BP: 136/71 114/61  Pulse:    Resp: 20 19  Temp:    HR 62 T max 97.9 H brady with some irregularity. No MRG  I personally viewed his EKG and confirmed by reading with the official read. Sinus bradycardia at 47 with some arrhythmia. Insignificant T-wave changes. No peaked T waves  Assessment and Plan: I have seen and evaluated the patient as outlined above. I agree with the formulated Assessment and Plan as detailed in the residents' note, with the following changes: Juan Calderon is a 61 year old man with clinically asymptomatic hypomagnesemia. Amiloride was added to his medication regimen on April 27. This medication combined with his ARB led to a lower blood pressure, hyperkalemia, and acute renal failure. His hyperkalemia likely is the cause of his bradycardia. His lower blood pressure is likely the cause of his dizziness. He has partially responded to 15 of Kayexalate but his potassium still is not yet in the normal range. We will will repeat the Kayexalate and attempt to treat him in the ED  ad to prevent an admission. We will not be able to  continue the amiloride at discharge due to the hyperkalemia when combined with his ARB. It was effective in increasing his magnesium level but I believe the ARB is more important to continue due to diabetic nephropathy. We will arrange close follow-up in the clinic.   1. Hyperkalemia -  repeat Kayexalate 15  repeat BMP If potassium is normal, he may be discharged to home. If remains elevated, admit to obs. Stop Amiloride and follow-up with PCP  2. Acute renal failure Responding without intervention and almost at  baseline Follow up as an outpatient  3. Bradycardia - improving as of hyperkalemia resolves Follow up as an outpatient  4. Hypomagnesemia -  continue oral magnesium supplementation Follow up as an outpatient Stop amiloride    Juan Crews, MD 5/26/20183:56 PM

## 2017-01-26 NOTE — Consult Note (Signed)
Date: 01/26/2017               Patient Name:  Juan Calderon MRN: [id number]  DOB: March 09, 1956 Age / Sex: 61 y.o., male   PCP: Juan Linsey, MD         Requesting Physician: Dr. Pattricia Boss, MD    Consulting Reason:  Hyperkalemia in [facility] Pt     Chief Complaint: Hyperkalemia  History of Present Illness:  Patient was evaluated in the internal medicine clinic yesterday by his PCP Dr. Eppie Calderon for follow-up of hypomagnesemia and other chronic conditions. The patient had recently been started on amiloride in addition to magnesium oxide for hypomagnesemia. Patient had routine labs drawn to evaluate magnesium as well as other electrolytes and renal function. Overnight, these routine labs resulted with a critical potassium of 6.7 and the on-call resident physician was notified. Residents attempted to contact Juan Calderon and made contact with him earlier this morning, recommending that he present to the emergency department for repeat lab evaluation and management of hyperkalemia. The patient reports that he had previously been feeling somewhat nauseous and had occasional dizzy spells over the last several days. He reports that these feelings are typically associated with episodes of atrial fibrillation for which she is treated with flecainide, warfarin, and metoprolol. Patient denies any syncopal episodes and reports that he is feeling much improved today. His initial repeat potassium level was 6.2. EKG did not show any acute changes. Patient was monitored on telemetry in the emergency department and was in normal sinus rhythm with occasional episodes of self-limited atrial fibrillation without rapid ventricular response. The patient was treated by the ED with Kayexalate and Lasix.  Patient denied any shortness of breath, chest pain, dizziness, abdominal pain, muscle aches. Patient reports that he had not taken his medications that morning but typically takes all of his medications. Patient is currently  on ACE inhibitor as well as the amiloride.  Meds: Current Outpatient Prescriptions  Medication Sig Dispense Refill  . albuterol (PROVENTIL HFA;VENTOLIN HFA) 108 (90 Base) MCG/ACT inhaler Inhale 2 puffs into the lungs every 6 (six) hours as needed for wheezing or shortness of breath. 1 Inhaler 0  . allopurinol (ZYLOPRIM) 300 MG tablet Take 1 tablet (300 mg total) by mouth daily. 90 tablet 3  . aMILoride (MIDAMOR) 5 MG tablet Take 1 tablet (5 mg total) by mouth daily. 90 tablet 3  . atorvastatin (LIPITOR) 20 MG tablet Take 1 tablet (20 mg total) by mouth daily. 90 tablet 3  . flecainide (TAMBOCOR) 100 MG tablet Take 1 tablet (100 mg total) by mouth every 12 (twelve) hours. 180 tablet 3  . latanoprost (XALATAN) 0.005 % ophthalmic solution Place 1 drop into both eyes at bedtime. 7.5 mL 2  . magnesium oxide (MAG-OX) 400 MG tablet Take 2 tablets (800 mg total) by mouth 2 (two) times daily. 360 tablet 3  . metFORMIN (GLUCOPHAGE) 1000 MG tablet Take 1 tablet (1,000 mg total) by mouth 2 (two) times daily with a meal. 180 tablet 3  . metoprolol tartrate (LOPRESSOR) 25 MG tablet Take 1 tablet (25 mg total) by mouth 2 (two) times daily. 180 tablet 3  . nitroGLYCERIN (NITROSTAT) 0.4 MG SL tablet Place 1 tablet (0.4 mg total) under the tongue every 5 (five) minutes as needed for chest pain. 30 tablet 11  . oxyCODONE-acetaminophen (PERCOCET) 10-325 MG tablet Take 1 tablet by mouth every 6 (six) hours as needed for pain. 105 tablet 0  . terazosin (HYTRIN) 5 MG  capsule Take 1 capsule (5 mg total) by mouth at bedtime. 90 capsule 3  . torsemide (DEMADEX) 20 MG tablet Take 2 tablets three times weekly (Patient taking differently: Take 40 mg by mouth every Monday, Wednesday, and Friday. ) 72 tablet 3  . warfarin (COUMADIN) 5 MG tablet TAKE AS DIRECTED PER COUMADIN CLINIC (Patient taking differently: Take 5 mg by mouth in the morning on Sun/Mon/Tues/Wed/Thurs/Sat and 2.5 mg on Fri) 30 tablet 1  . Blood Glucose  Monitoring Suppl (ONETOUCH VERIO) w/Device KIT 1 each by Does not apply route daily. 1 kit 0  . Dextromethorphan-Guaifenesin 20-400 MG/5ML SYRP Take 5 mLs by mouth every 4 (four) hours as needed (cough). (Patient not taking: Reported on 01/26/2017) 120 mL 0  . glucose blood (ONETOUCH VERIO) test strip Use as instructed 100 each 12  . ONETOUCH DELICA LANCETS 16W MISC Use 1 strip daily 100 each 5  . ranitidine (ZANTAC) 150 MG tablet Take 1 tablet (150 mg total) by mouth 2 (two) times daily. (Patient not taking: Reported on 01/26/2017) 180 tablet 3  . sildenafil (VIAGRA) 100 MG tablet Take 1 tablet (100 mg total) by mouth daily as needed for erectile dysfunction. 5 tablet 11  . valsartan (DIOVAN) 160 MG tablet Take 1 tablet (160 mg total) by mouth daily. (Patient not taking: Reported on 01/26/2017) 90 tablet 3    Allergies: Allergies as of 01/26/2017 - Review Complete 01/26/2017  Allergen Reaction Noted  . Ramipril Swelling 09/18/2011  . Testosterone Rash 12/04/2011   Past Medical History:  Diagnosis Date  . Alcohol abuse    . C6 radiculopathy 01/24/2016   Right upper extremity, mild to moderate electrically by EMG on 01/24/2016  . Cataract    Left eye  . Chronic diastolic heart failure ([place])     with mild left ventricular hypertrophy on Echo 02/2010  . Chronic obstructive pulmonary disease ([place])    . Chronic osteomyelitis of femur ([place]) 04/06/2016  . Chronic pain syndrome     Left arm and leg s/p traumatic injury   . Chronic renal insufficiency    . Coronary artery disease     25% LAD stenosis on cath 2007.  Stable angina.  . Diverticulosis    . Essential hypertension    . Gastroesophageal reflux disease    . Gout    . Hyperlipidemia LDL goal < 100    . Long-term current use of opiate analgesic 09/07/2016  . Mild carpal tunnel syndrome of right wrist 01/24/2016   Mild degree electrically per EMG 01/24/2016   . Morbid obesity with BMI of 40.0-44.9, adult ([place])    . Normocytic anemia    .  Obstructive sleep apnea     Moderate, AHI 29.8 per hour with moderately loud snoring and oxygen desaturation to a nadir of 79%. CPAP titration resulted in a prescription for 17 CWP.    [name] Open-angle glaucoma    . Osteoarthritis cervical spine    . Osteoarthritis of left knee 06/19/2013   Tricompartmental disease.  Treated with double hinged upright knee brace, steroid/xylocaine knee injections, and NSAIDs   . Paroxysmal atrial fibrillation ([place]) 10/25/2015  . Persistent atrial fibrillation ([place])    . Right rotator cuff tear     Large full-thickness tear of the supraspinatus with mild retraction but no atrophy   . Subclinical hypothyroidism    . 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 [facility])     Past Surgical History:  Procedure Laterality Date  . CARDIOVERSION  N/A 12/30/2014   Procedure: CARDIOVERSION;  Surgeon: [name], MD;  Location: [facility] ENDOSCOPY;  Service: Cardiovascular;  Laterality: N/A;  . FRACTURE SURGERY Left 1980's   Elbow  . Left arm surgery    . Left leg surgery    . SHOULDER SURGERY     Right   Family History  Problem Relation Age of Onset  . Heart failure Mother   . Alzheimer's disease Father   . Heart failure Brother   . Osteoarthritis Brother   . Prostate cancer Brother   . Early death Brother        Manufacturing systems engineer  . Hypertension Sister   . Hypertension Brother   . Heart attack Neg Hx   . Stroke Neg Hx    Social Hx: Pt reports that he has never smoked. He has never used smokeless tobacco. He reports that he drinks alcohol. He reports that he does not use drugs.  Review of Systems: Pertinent items noted in HPI and remainder of comprehensive ROS otherwise negative.  Physical Exam: Blood pressure 115/63, pulse 62, temperature 97.9 F (36.6 C), temperature source Oral, resp. rate 17, height _0  (1.727 m), weight 294 lb 12.8 oz (133.7 kg), SpO2 98 %.   Physical Exam  Constitutional: He is oriented to person, place, and  time. He appears well-developed and well-nourished. He is cooperative. No distress.  HENT:  Head: Normocephalic and atraumatic.  Right Ear: Hearing normal.  Left Ear: Hearing normal.  Nose: Nose normal.  Mouth/Throat: Mucous membranes are normal.  Cardiovascular: Normal rate, regular rhythm, S1 normal, S2 normal and intact distal pulses.  Exam reveals no gallop.   No murmur heard. Pulmonary/Chest: Effort normal and breath sounds normal. No respiratory distress. He has no wheezes. He has no rhonchi. He has no rales. He exhibits no tenderness.  Abdominal: Soft. Normal appearance and bowel sounds are normal. He exhibits no ascites. There is no hepatosplenomegaly. There is no tenderness.  Musculoskeletal: He exhibits no edema.  Neurological: He is alert and oriented to person, place, and time. He has normal strength.  Skin: Skin is warm, dry and intact. He is not diaphoretic.  Psychiatric: He has a normal mood and affect. His speech is normal and behavior is normal.   Lab results: Basic Metabolic Panel:  Recent Labs  01/25/17 1114 01/26/17 0939  NA 139 135  K 6.7* 6.2*  CL 105 107  CO2 20 21*  GLUCOSE 136* 133*  BUN 38* 38*  CREATININE 1.62* 1.53*  CALCIUM 9.6 9.6  MG 2.1  --    CBC:  Recent Labs  01/26/17 0939  WBC 7.0  HGB 13.0  HCT 41.3  MCV 92.6  PLT 187   Coagulation:  Recent Labs  01/26/17 0952  LABPROT 20.2*  INR 1.70   Urinalysis:  Recent Labs  01/26/17 1224  [place] 1.009  PHURINE 5.0  GLUCOSEU NEGATIVE  HGBUR NEGATIVE  BILIRUBINUR NEGATIVE  KETONESUR NEGATIVE  PROTEINUR NEGATIVE  NITRITE NEGATIVE  LEUKOCYTESUR NEGATIVE   Imaging results:  Dg Chest 2 View Result Date: 01/26/2017 CLINICAL DATA:  Chest pain. EXAM: CHEST  2 VIEW COMPARISON:  Radiograph of June 29, 2016. FINDINGS: Stable cardiomegaly. Stable mild central pulmonary vascular congestion is noted. No pneumothorax or pleural effusion is noted. No acute pulmonary  disease is noted. Bony thorax is unremarkable. IMPRESSION: Stable cardiomegaly with mild central pulmonary vascular congestion. No other abnormality seen. Electronically Signed   By: Marijo Conception, M.D.  On: 01/26/2017 10:46   Other results: EKG: unchanged from previous tracings, sinus bradycardia, no TW changes or PR changes.  Assessment, Plan, & Recommendations by Problem: Active Problems:   Hyperkalemia  Hyperkalemia: Pt with hyperkalemia 2/2 K-sparing anti-hypertensives valsartan and amiloride. Treated with Kayaxalate and Lasix in the ED to good effect to 5.8. No EKG changes. Pt w/o acute complaint. Would recommend holding amiloride until follow up in the [facility], can resume other home meds tomorrow. We will plan to give another dose of kayexalate and recheck labs at 6:30pm.  UPDATE: Pt's K has normalized to 5.1 and we expect that without amiloride K will continue to remain wnl. F/u w/ [facility] next week.  Hypomagnesemia: h/o low Mg, on supplements and amiloride. Last check in clinic yesterday was 2.1. Will reevaluate at follow-up. Hold amiloride as above.  Bradycardia: h/o PAF currently on metoprolol XL. Pulse rate is typically in the 50s, which is consistent with current readings. Would recommend holding BB today d/t HR of 48 this AM, can restart BB tomorrow as usual.  PAF: Pt was complaining of several day h/o nausea, dizziness and "feeling like my heart is going to jump" which he describes as the typical sensation which occurs when he has episodes of atrial fibrillation. Pt has regular rhythm on EKG and was in NS rhythm on ED telemetry during his interview. Continue w/ flecanaide, metoprolol (resume tomorrow), and AC w/ warfarin.  Dispo: Pt is stable for discharge.  The patient does have a current PCP Juan Linsey, MD) and does need an [facility] hospital follow-up appointment after discharge.  The patient does not have transportation limitations that hinder transportation to clinic  appointments.  Signed: Holley Raring, MD 01/26/2017, 3:03 PM

## 2017-01-26 NOTE — Telephone Encounter (Signed)
INTERNAL MEDICINE RESIDENCY PROGRAM After-Hours Telephone Call    Reason for call:   I received a call from Mr. KALID GHAN on 01/26/2017 at 4:54 AM indicating that K is 6.6.    Pertinent Data:   Pt on valsartan, renal function wnl 09/2016.    Assessment / Plan / Recommendations:   Will have day team call pt to recommend he go to the ED for repeat labs and to hold valsartan.   As always, pt is advised that if symptoms worsen or new symptoms arise, they should go to an urgent care facility or to to ER for further evaluation.    Norman Herrlich, MD   01/26/2017, 4:54 AM

## 2017-01-26 NOTE — ED Notes (Signed)
This RN spoke with Dr Marlowe Sax and teaching service and they want to keep the pt in the ED and give another dose of kayexelate then recheck K level. Pt was informed of this idea and that he may have to be placed in a hallway bed, pt stated "I'm leaving if I have to go to the hallway" and asked "Why don't they just admit me" Charge RN notified and MD over pt's care notified. Called this RN and spoke with pt directly. Pt is going to remain in ED room and get another dose of kayexelate and then have K level rechecked at 1800

## 2017-01-26 NOTE — ED Notes (Signed)
This RN spoke with Dr Marlowe Sax, she stated that she wanted to check BMP and then decide if pt needs to be admitted.

## 2017-01-26 NOTE — ED Notes (Signed)
Dr Marlowe Sax called this RN and gave discharge orders. Pt is to stop taking Amilioride and the office will call the pt to get him in this week for a recheck. Pt verbalized understanding of d/c instructions and has no further questions. VSS, NAD.

## 2017-01-29 NOTE — Progress Notes (Signed)
Patient ID: TREMEL SETTERS, male   DOB: Jul 26, 1956, 61 y.o.   MRN: [id number]  BMP  K 6.7, BUN, 8, Creatinine 1.62, eGFR 52  Critically high potassium level addressed the night it was resulted.  At last recheck after ED intervention it was 5.1.  Creatinine elevated likely related to recent nausea and poor oral intake.  Those symptoms have since resolved.  In addition, addition of amiloride lead to both the hyperkalemia and dehydration.  This has since been stopped.  Magnesium  Mg 2.1  Corrected with increase in MgO dose, addition of amiloride, and discontinuance of PPI in the setting of concomitant loop diuretic.  With the hyperkalemia and acute renal insufficiency the amiloride has been discontinued.  I am hopeful that with the increased supplemental MgO and the discontinuance of the PPI therapy we will be able to maintain the magnesium level without additional interventions such as the amiloride, especially given the stores appear to have been repleted.  I have asked that Mr. Vanderhoff be seen in the [facility] this week for a repeat BMP and magnesium level to assure stability off of amiloride.

## 2017-01-30 ENCOUNTER — Ambulatory Visit (INDEPENDENT_AMBULATORY_CARE_PROVIDER_SITE_OTHER): Payer: Medicare Other | Admitting: *Deleted

## 2017-01-30 DIAGNOSIS — I25119 Atherosclerotic heart disease of native coronary artery with unspecified angina pectoris: Secondary | ICD-10-CM

## 2017-01-30 DIAGNOSIS — Z5181 Encounter for therapeutic drug level monitoring: Secondary | ICD-10-CM | POA: Diagnosis not present

## 2017-01-30 LAB — POCT INR: INR: 2

## 2017-02-01 ENCOUNTER — Other Ambulatory Visit: Payer: Self-pay | Admitting: Cardiovascular Disease

## 2017-02-01 DIAGNOSIS — I4891 Unspecified atrial fibrillation: Secondary | ICD-10-CM

## 2017-02-07 ENCOUNTER — Encounter: Payer: Self-pay | Admitting: Internal Medicine

## 2017-02-07 ENCOUNTER — Ambulatory Visit (INDEPENDENT_AMBULATORY_CARE_PROVIDER_SITE_OTHER): Payer: Medicare Other | Admitting: Internal Medicine

## 2017-02-07 VITALS — BP 104/60 | HR 64 | Temp 97.5°F | Wt 293.9 lb

## 2017-02-07 DIAGNOSIS — I5032 Chronic diastolic (congestive) heart failure: Secondary | ICD-10-CM

## 2017-02-07 DIAGNOSIS — M1710 Unilateral primary osteoarthritis, unspecified knee: Secondary | ICD-10-CM

## 2017-02-07 DIAGNOSIS — Z0000 Encounter for general adult medical examination without abnormal findings: Secondary | ICD-10-CM

## 2017-02-07 DIAGNOSIS — Z6841 Body Mass Index (BMI) 40.0 and over, adult: Secondary | ICD-10-CM | POA: Diagnosis not present

## 2017-02-07 DIAGNOSIS — G8929 Other chronic pain: Secondary | ICD-10-CM

## 2017-02-07 DIAGNOSIS — Z79899 Other long term (current) drug therapy: Secondary | ICD-10-CM

## 2017-02-07 DIAGNOSIS — Z5189 Encounter for other specified aftercare: Secondary | ICD-10-CM

## 2017-02-07 DIAGNOSIS — G4733 Obstructive sleep apnea (adult) (pediatric): Secondary | ICD-10-CM

## 2017-02-07 DIAGNOSIS — E875 Hyperkalemia: Secondary | ICD-10-CM

## 2017-02-07 DIAGNOSIS — M545 Low back pain: Secondary | ICD-10-CM

## 2017-02-07 DIAGNOSIS — K219 Gastro-esophageal reflux disease without esophagitis: Secondary | ICD-10-CM

## 2017-02-07 DIAGNOSIS — I10 Essential (primary) hypertension: Secondary | ICD-10-CM

## 2017-02-07 DIAGNOSIS — I110 Hypertensive heart disease with heart failure: Secondary | ICD-10-CM

## 2017-02-07 DIAGNOSIS — E291 Testicular hypofunction: Secondary | ICD-10-CM | POA: Insufficient documentation

## 2017-02-07 HISTORY — DX: Testicular hypofunction: E29.1

## 2017-02-07 NOTE — Assessment & Plan Note (Signed)
Assessment  In the emergency department he was noted to have resolution of his hypomagnesemia. The cause of the hypomagnesemia was felt to be multifactorial and related to the loop diuretic for his heart failure in combination with PPI therapy. Amiloride as well as increasing the magnesium oxide dose while simultaneously stopping the PPI therapy resolved the problem.  Plan  His magnesium level was obtained at this visit and is pending at the time of this dictation. If the magnesium level remains with in normal limits we will consider reestablishing the PPI therapy, but continuing the high-dose magnesium oxide that he is currently on.

## 2017-02-07 NOTE — Assessment & Plan Note (Signed)
He is currently up-to-date on his health care maintenance. 

## 2017-02-07 NOTE — Assessment & Plan Note (Signed)
Assessment  Symptomatically his chronic diastolic heart failure is well controlled on his current regimen which includes torsemide 20 mg 3 times weekly and metoprolol 25 mg by mouth twice daily.  Plan  We will continue the torsemide 20 mg 3 times weekly and metoprolol 25 mg by mouth twice daily and reassess the control of his chronic diastolic heart failure symptoms at the follow-up visit.

## 2017-02-07 NOTE — Assessment & Plan Note (Signed)
Assessment  He continues to have symptomatic gastroesophageal reflux disease with the cessation of his PPI therapy. He has failed as needed Maalox as well as ranitidine. Now that we have repleted his magnesium he may be a candidate to reinitiate PPI therapy, even in the setting of continued loop diuretic therapy, if we can maintain his magnesium stores with the high-dose magnesium oxide.  Plan  A magnesium level was obtained during this visit and is pending at the time of this dictation. If it is still within the normal range we will consider starting low-dose Nexium therapy in order to get better control of his gastroesophageal reflux disease symptoms. If we take this route we will have to continue the high-dose magnesium oxide and reassess the magnesium level at the follow-up visit. I'm hopeful that continued slow weight loss will also positively impact symptom control of his gastroesophageal reflux disease.

## 2017-02-07 NOTE — Patient Instructions (Signed)
It was good to see you again.  I am sorry for the high potassium and the need to spend a day in the ED.  I think it was because of the medication we had used to get your magnesium level up.  We have since stopped this.  1) Keep taking the medications as you are.  Remember, stop the Diovan, amiloride, and ranitdine as you do not need them any more.  2) I checked a magnesium and potassium level today.  I should have the results by tomorrow and I will give you a call on your cell.  If the magnesium is OK we will consider restarting your Nexium for your acid reflux.  3) Thanks for scheduling the eye exam for 6/13.  Please enjoy your grandson's graduation this weekend.  I will see you in 3 months, sooner if necessary.

## 2017-02-07 NOTE — Assessment & Plan Note (Signed)
Assessment  Juan Calderon was recently seen in the emergency department for hyperkalemia that required intervention to decrease his potassium levels. This was an acute event precipitated by the addition of amiloride in the setting of valsartan in hopes of improving his magnesium level. Although the magnesium level was improved he also had significant hyperkalemia with this. In the emergency department his potassium was lowered to 5.1 and his amiloride and valsartan were stopped. He has had no adverse effects of stopping either of these medications and his blood pressure today included a systolic of 545.  Plan  A BMP was obtained to assess the potassium level today and is pending at the time of this dictation. His blood pressure does not require further valsartan. He has chronic diastolic rather than systolic heart failure and therefore does not require an ARB for this per se. He does have diabetes and could benefit from the renal protective effects of the ARB although with this blood pressure and given the recent hyperkalemia the benefits do not outweigh the risks.

## 2017-02-07 NOTE — Assessment & Plan Note (Signed)
Assessment  His blood pressure today was 104/60 on metoprolol 25 mg by mouth daily and terazosin 5 mg by mouth nightly. His valsartan had been stopped in the emergency department when he had the episode of hyperkalemia so this blood pressure reflects being off the ARB.  Plan  We will continue the metoprolol and terazosin at the current dose and officially discontinue the valsartan given the excellent blood pressure control without it. We will reassess the blood pressure control at the follow-up visit on this new regimen.

## 2017-02-07 NOTE — Progress Notes (Signed)
   Subjective:    Patient ID: Juan Calderon, male    DOB: 06-14-1956, 61 y.o.   MRN: [id number]  HPI  Juan Calderon is here for follow-up of his potassium after an ED visit for hyperkalemia.  He is also here for reassessment of his blood pressure, chronic diastolic heart failure, GERD, and hypomagnesemia. Please see the A&P for the status of the pt's chronic medical problems.  Review of Systems  Constitutional: Negative for activity change and unexpected weight change.  Respiratory: Negative for chest tightness, shortness of breath and wheezing.   Cardiovascular: Positive for leg swelling. Negative for chest pain and palpitations.  Gastrointestinal: Negative for abdominal pain, constipation, diarrhea, nausea and vomiting.  Musculoskeletal: Positive for arthralgias, back pain and gait problem.  Skin: Negative for rash.  Neurological: Negative for dizziness and light-headedness.      Objective:   Physical Exam  Constitutional: He is oriented to person, place, and time. He appears well-developed and well-nourished. No distress.  HENT:  Head: Normocephalic and atraumatic.  Eyes: Conjunctivae are normal. Right eye exhibits no discharge. Left eye exhibits no discharge. No scleral icterus.  Cardiovascular: Normal rate, regular rhythm and normal heart sounds.  Exam reveals no gallop and no friction rub.   No murmur heard. Pulmonary/Chest: Effort normal and breath sounds normal. No respiratory distress. He has no wheezes. He has no rales.  Abdominal: Soft. Bowel sounds are normal. He exhibits no distension. There is no tenderness. There is no rebound and no guarding.  Musculoskeletal: Normal range of motion. He exhibits edema, tenderness and deformity.  Neurological: He is alert and oriented to person, place, and time. He exhibits normal muscle tone.  Skin: Skin is warm and dry. No rash noted. He is not diaphoretic. No erythema.  Psychiatric: He has a normal mood and affect. His behavior is  normal. Judgment and thought content normal.  Nursing note and vitals reviewed.     Assessment & Plan:   Please see problem based charting.

## 2017-02-07 NOTE — Assessment & Plan Note (Signed)
Assessment  He still is unable to afford the oral appliance, but states he is working on this.  Plan  We will reassess at as to whether or not he has received the oral appliance for his obstructive sleep apnea at the follow-up visit.

## 2017-02-07 NOTE — Assessment & Plan Note (Signed)
Assessment  His weight is down another pound in the last several weeks. Nonetheless, his morbid obesity is contributing to several of his chronic medical problems including his obstructive sleep apnea, worsening of his knee osteoarthritis, his chronic low back pain, and his chronic diastolic heart failure.  Plan  He was praised on his continued slow weight loss and encouraged to continue to restrict his calories so that he may benefit from additional weight loss.

## 2017-02-08 LAB — BMP8+ANION GAP
Anion Gap: 18 mmol/L (ref 10.0–18.0)
BUN / CREAT RATIO: 14 (ref 10–24)
BUN: 20 mg/dL (ref 8–27)
CO2: 25 mmol/L (ref 18–29)
CREATININE: 1.4 mg/dL — AB (ref 0.76–1.27)
Calcium: 9.3 mg/dL (ref 8.6–10.2)
Chloride: 99 mmol/L (ref 96–106)
GFR calc Af Amer: 62 mL/min/{1.73_m2} (ref >59)
GFR calc non Af Amer: 53 mL/min/{1.73_m2} — ABNORMAL LOW (ref >59)
Glucose: 154 mg/dL — ABNORMAL HIGH (ref 65–99)
Potassium: 4.3 mmol/L (ref 3.5–5.2)
Sodium: 142 mmol/L (ref 134–144)

## 2017-02-08 LAB — MAGNESIUM: Magnesium: 1.7 mg/dL (ref 1.6–2.3)

## 2017-02-08 NOTE — Progress Notes (Signed)
Patient ID: KOY LAMP, male   DOB: Apr 20, 1956, 61 y.o.   MRN: [id number]  BMP K 4.3, BUN 20, Cr 1.40, eGFR 62  Hypokalemia has resolved with discontinuance of amiloride and valsartan.  Mg 1.7  Mg level has dropped slightly with the discontinuance of the amiloride.  Will continue the high dose MgO and not restart the PPI at this time.  If Mg. Level stabilizes at next visit we will consider restart of PPI therapy.  I called Mr. Jankowski to discuss the results.  He agrees with the plan as outlined above.

## 2017-02-13 DIAGNOSIS — H04123 Dry eye syndrome of bilateral lacrimal glands: Secondary | ICD-10-CM | POA: Diagnosis not present

## 2017-02-13 DIAGNOSIS — E119 Type 2 diabetes mellitus without complications: Secondary | ICD-10-CM | POA: Diagnosis not present

## 2017-02-13 DIAGNOSIS — H25813 Combined forms of age-related cataract, bilateral: Secondary | ICD-10-CM | POA: Diagnosis not present

## 2017-02-13 DIAGNOSIS — H40013 Open angle with borderline findings, low risk, bilateral: Secondary | ICD-10-CM | POA: Diagnosis not present

## 2017-02-23 ENCOUNTER — Other Ambulatory Visit: Payer: Self-pay | Admitting: Internal Medicine

## 2017-02-23 DIAGNOSIS — I10 Essential (primary) hypertension: Secondary | ICD-10-CM

## 2017-02-25 ENCOUNTER — Ambulatory Visit (INDEPENDENT_AMBULATORY_CARE_PROVIDER_SITE_OTHER): Payer: Medicare Other | Admitting: Pharmacist

## 2017-02-25 DIAGNOSIS — I25119 Atherosclerotic heart disease of native coronary artery with unspecified angina pectoris: Secondary | ICD-10-CM

## 2017-02-25 DIAGNOSIS — Z5181 Encounter for therapeutic drug level monitoring: Secondary | ICD-10-CM | POA: Diagnosis not present

## 2017-02-25 LAB — POCT INR: INR: 2.6

## 2017-03-08 ENCOUNTER — Telehealth: Payer: Self-pay | Admitting: *Deleted

## 2017-03-08 DIAGNOSIS — M170 Bilateral primary osteoarthritis of knee: Secondary | ICD-10-CM | POA: Diagnosis not present

## 2017-03-08 DIAGNOSIS — M1732 Unilateral post-traumatic osteoarthritis, left knee: Secondary | ICD-10-CM

## 2017-03-08 NOTE — Telephone Encounter (Addendum)
Received call from patient stating he has an appt today @ 11am with [facility].  Pt was referred to ortho last year by pcp to follow-up knee pain. Pt has been seen by [facility] in the past, but a new referral is needed for the 2018 calender year for insurance purposes.   A new referral was placed-will send to pcp for cosign.[name], Darlene Cassady7/6/201810:31 AM

## 2017-03-13 ENCOUNTER — Telehealth: Payer: Self-pay | Admitting: *Deleted

## 2017-03-13 DIAGNOSIS — M1A00X0 Idiopathic chronic gout, unspecified site, without tophus (tophi): Secondary | ICD-10-CM

## 2017-03-13 MED ORDER — ALLOPURINOL 100 MG PO TABS: 300.0000 mg | ORAL_TABLET | Freq: Every day | ORAL | 3 refills | Status: DC

## 2017-03-13 NOTE — Telephone Encounter (Signed)
Per fax from [facility] pharmacy:  Allopurinol 300 mg backordered/unavailable. Please send new Rx (possibly for allopurinol 100 mg).

## 2017-03-16 ENCOUNTER — Other Ambulatory Visit: Payer: Self-pay | Admitting: Internal Medicine

## 2017-03-16 DIAGNOSIS — I5032 Chronic diastolic (congestive) heart failure: Secondary | ICD-10-CM

## 2017-03-25 ENCOUNTER — Ambulatory Visit (INDEPENDENT_AMBULATORY_CARE_PROVIDER_SITE_OTHER): Payer: Medicare Other

## 2017-03-25 DIAGNOSIS — Z5181 Encounter for therapeutic drug level monitoring: Secondary | ICD-10-CM | POA: Diagnosis not present

## 2017-03-25 DIAGNOSIS — I25119 Atherosclerotic heart disease of native coronary artery with unspecified angina pectoris: Secondary | ICD-10-CM | POA: Diagnosis not present

## 2017-03-25 LAB — POCT INR: INR: 4.2

## 2017-04-03 ENCOUNTER — Ambulatory Visit (INDEPENDENT_AMBULATORY_CARE_PROVIDER_SITE_OTHER): Payer: Medicare Other | Admitting: *Deleted

## 2017-04-03 DIAGNOSIS — Z5181 Encounter for therapeutic drug level monitoring: Secondary | ICD-10-CM

## 2017-04-03 DIAGNOSIS — I25119 Atherosclerotic heart disease of native coronary artery with unspecified angina pectoris: Secondary | ICD-10-CM

## 2017-04-03 LAB — POCT INR: INR: 2.9

## 2017-04-22 ENCOUNTER — Ambulatory Visit (INDEPENDENT_AMBULATORY_CARE_PROVIDER_SITE_OTHER): Payer: Medicare Other | Admitting: Pharmacist

## 2017-04-22 DIAGNOSIS — Z5181 Encounter for therapeutic drug level monitoring: Secondary | ICD-10-CM | POA: Diagnosis not present

## 2017-04-22 DIAGNOSIS — I25119 Atherosclerotic heart disease of native coronary artery with unspecified angina pectoris: Secondary | ICD-10-CM

## 2017-04-22 LAB — POCT INR: INR: 3.1

## 2017-05-04 ENCOUNTER — Other Ambulatory Visit: Payer: Self-pay | Admitting: Internal Medicine

## 2017-05-04 DIAGNOSIS — I10 Essential (primary) hypertension: Secondary | ICD-10-CM

## 2017-05-12 ENCOUNTER — Encounter (HOSPITAL_COMMUNITY): Payer: Self-pay | Admitting: Emergency Medicine

## 2017-05-12 ENCOUNTER — Emergency Department (HOSPITAL_COMMUNITY): Payer: Medicare Other

## 2017-05-12 ENCOUNTER — Emergency Department (HOSPITAL_COMMUNITY)
Admission: EM | Admit: 2017-05-12 | Discharge: 2017-05-12 | Disposition: A | Discharge status: Home / Self Care | Payer: Medicare Other | Attending: Emergency Medicine | Admitting: Emergency Medicine

## 2017-05-12 DIAGNOSIS — I480 Paroxysmal atrial fibrillation: Secondary | ICD-10-CM | POA: Insufficient documentation

## 2017-05-12 DIAGNOSIS — I110 Hypertensive heart disease with heart failure: Secondary | ICD-10-CM | POA: Insufficient documentation

## 2017-05-12 DIAGNOSIS — S42301A Unspecified fracture of shaft of humerus, right arm, initial encounter for closed fracture: Secondary | ICD-10-CM

## 2017-05-12 DIAGNOSIS — W19XXXA Unspecified fall, initial encounter: Secondary | ICD-10-CM | POA: Diagnosis not present

## 2017-05-12 DIAGNOSIS — S20212A Contusion of left front wall of thorax, initial encounter: Secondary | ICD-10-CM | POA: Diagnosis not present

## 2017-05-12 DIAGNOSIS — S279XXA Injury of unspecified intrathoracic organ, initial encounter: Secondary | ICD-10-CM | POA: Diagnosis not present

## 2017-05-12 DIAGNOSIS — Y939 Activity, unspecified: Secondary | ICD-10-CM | POA: Insufficient documentation

## 2017-05-12 DIAGNOSIS — S59901A Unspecified injury of right elbow, initial encounter: Secondary | ICD-10-CM | POA: Diagnosis not present

## 2017-05-12 DIAGNOSIS — Z7901 Long term (current) use of anticoagulants: Secondary | ICD-10-CM | POA: Insufficient documentation

## 2017-05-12 DIAGNOSIS — R079 Chest pain, unspecified: Secondary | ICD-10-CM | POA: Diagnosis not present

## 2017-05-12 DIAGNOSIS — S42341A Displaced spiral fracture of shaft of humerus, right arm, initial encounter for closed fracture: Secondary | ICD-10-CM | POA: Diagnosis not present

## 2017-05-12 DIAGNOSIS — R0781 Pleurodynia: Secondary | ICD-10-CM | POA: Diagnosis not present

## 2017-05-12 DIAGNOSIS — S199XXA Unspecified injury of neck, initial encounter: Secondary | ICD-10-CM | POA: Diagnosis not present

## 2017-05-12 DIAGNOSIS — J449 Chronic obstructive pulmonary disease, unspecified: Secondary | ICD-10-CM | POA: Diagnosis not present

## 2017-05-12 DIAGNOSIS — Y929 Unspecified place or not applicable: Secondary | ICD-10-CM | POA: Diagnosis not present

## 2017-05-12 DIAGNOSIS — S42294A Other nondisplaced fracture of upper end of right humerus, initial encounter for closed fracture: Secondary | ICD-10-CM | POA: Diagnosis not present

## 2017-05-12 DIAGNOSIS — Z79899 Other long term (current) drug therapy: Secondary | ICD-10-CM | POA: Diagnosis not present

## 2017-05-12 DIAGNOSIS — S299XXA Unspecified injury of thorax, initial encounter: Secondary | ICD-10-CM | POA: Diagnosis not present

## 2017-05-12 DIAGNOSIS — I5032 Chronic diastolic (congestive) heart failure: Secondary | ICD-10-CM | POA: Diagnosis not present

## 2017-05-12 DIAGNOSIS — E785 Hyperlipidemia, unspecified: Secondary | ICD-10-CM | POA: Diagnosis not present

## 2017-05-12 DIAGNOSIS — E119 Type 2 diabetes mellitus without complications: Secondary | ICD-10-CM | POA: Diagnosis not present

## 2017-05-12 DIAGNOSIS — Y998 Other external cause status: Secondary | ICD-10-CM | POA: Insufficient documentation

## 2017-05-12 DIAGNOSIS — Z7984 Long term (current) use of oral hypoglycemic drugs: Secondary | ICD-10-CM | POA: Insufficient documentation

## 2017-05-12 DIAGNOSIS — S0990XA Unspecified injury of head, initial encounter: Secondary | ICD-10-CM | POA: Insufficient documentation

## 2017-05-12 DIAGNOSIS — S4991XA Unspecified injury of right shoulder and upper arm, initial encounter: Secondary | ICD-10-CM | POA: Diagnosis present

## 2017-05-12 LAB — CBC
HEMATOCRIT: 27.8 % — AB (ref 39.0–52.0)
HEMOGLOBIN: 9 g/dL — AB (ref 13.0–17.0)
MCH: 29.8 pg (ref 26.0–34.0)
MCHC: 32.4 g/dL (ref 30.0–36.0)
MCV: 92.1 fL (ref 78.0–100.0)
Platelets: 226 10*3/uL (ref 150–400)
RBC: 3.02 MIL/uL — AB (ref 4.22–5.81)
RDW: 15.7 % — AB (ref 11.5–15.5)
WBC: 10.2 10*3/uL (ref 4.0–10.5)

## 2017-05-12 LAB — COMPREHENSIVE METABOLIC PANEL
ALT: 19 U/L (ref 17–63)
ANION GAP: 12 (ref 5–15)
AST: 18 U/L (ref 15–41)
Albumin: 3.4 g/dL — ABNORMAL LOW (ref 3.5–5.0)
Alkaline Phosphatase: 67 U/L (ref 38–126)
BILIRUBIN TOTAL: 0.6 mg/dL (ref 0.3–1.2)
BUN: 24 mg/dL — AB (ref 6–20)
CHLORIDE: 100 mmol/L — AB (ref 101–111)
CO2: 20 mmol/L — ABNORMAL LOW (ref 22–32)
Calcium: 8.5 mg/dL — ABNORMAL LOW (ref 8.9–10.3)
Creatinine, Ser: 1.69 mg/dL — ABNORMAL HIGH (ref 0.61–1.24)
GFR, EST AFRICAN AMERICAN: 48 mL/min — AB (ref >60)
GFR, EST NON AFRICAN AMERICAN: 42 mL/min — AB (ref >60)
Glucose, Bld: 166 mg/dL — ABNORMAL HIGH (ref 65–99)
POTASSIUM: 3.4 mmol/L — AB (ref 3.5–5.1)
SODIUM: 132 mmol/L — AB (ref 135–145)
TOTAL PROTEIN: 6.3 g/dL — AB (ref 6.5–8.1)

## 2017-05-12 LAB — I-STAT CHEM 8, ED
BUN: 27 mg/dL — AB (ref 6–20)
CHLORIDE: 100 mmol/L — AB (ref 101–111)
Calcium, Ion: 1.09 mmol/L — ABNORMAL LOW (ref 1.15–1.40)
Creatinine, Ser: 2 mg/dL — ABNORMAL HIGH (ref 0.61–1.24)
Glucose, Bld: 164 mg/dL — ABNORMAL HIGH (ref 65–99)
HCT: 31 % — ABNORMAL LOW (ref 39.0–52.0)
Hemoglobin: 10.5 g/dL — ABNORMAL LOW (ref 13.0–17.0)
POTASSIUM: 3.4 mmol/L — AB (ref 3.5–5.1)
SODIUM: 135 mmol/L (ref 135–145)
TCO2: 21 mmol/L — ABNORMAL LOW (ref 22–32)

## 2017-05-12 LAB — PROTIME-INR
INR: 2.36
PROTHROMBIN TIME: 25.6 s — AB (ref 11.4–15.2)

## 2017-05-12 LAB — SAMPLE TO BLOOD BANK

## 2017-05-12 LAB — ETHANOL: Alcohol, Ethyl (B): 257 mg/dL — ABNORMAL HIGH (ref <5)

## 2017-05-12 LAB — CDS SEROLOGY

## 2017-05-12 MED ORDER — HYDROCODONE-ACETAMINOPHEN 5-325 MG PO TABS: 1.0000 | ORAL_TABLET | Freq: Four times a day (QID) | ORAL | 0 refills | Status: DC | PRN

## 2017-05-12 MED ORDER — FENTANYL CITRATE (PF) 100 MCG/2ML IJ SOLN
100.0000 ug | Freq: Once | INTRAMUSCULAR | Status: AC
  Administered 2017-05-12: 100 ug via INTRAVENOUS
  Filled 2017-05-12: qty 2

## 2017-05-12 MED ORDER — METHOCARBAMOL 500 MG PO TABS: 500.0000 mg | ORAL_TABLET | Freq: Two times a day (BID) | ORAL | 0 refills | Status: DC

## 2017-05-12 NOTE — ED Notes (Signed)
Patient transported to CT 

## 2017-05-12 NOTE — Discharge Instructions (Signed)
Please see Orthopedics in 1 week.

## 2017-05-12 NOTE — ED Notes (Signed)
Pt's O2 sats dropped into the 80's.  2L of O2 applied via [place] w/ improvement in O2 sat to 97%.

## 2017-05-12 NOTE — Progress Notes (Addendum)
Orthopedic Tech Progress Note Patient Details:  Juan Calderon 1956/03/24 [id number]  Ortho Devices Type of Ortho Device: Arm sling, Post (long arm) splint Ortho Device/Splint Location: rue Ortho Device/Splint Interventions: Ordered, Application, Adjustment [name] held for me.  [name] 05/12/2017, 7:08 AM

## 2017-05-12 NOTE — ED Triage Notes (Signed)
Per EMS, pt slipped and fell in his kitchen. Pulses intact, difficult movement of the rt shoulder. Pain to left ribs. No LOC. No head/neck/back pain. ETOH use. Pt takes Coumadin, denies hitting head.

## 2017-05-12 NOTE — ED Notes (Signed)
ED Provider at bedside. 

## 2017-05-12 NOTE — ED Provider Notes (Signed)
[facility] DEPT Provider Note   CSN: [id number] Arrival date & time: 05/12/17  0212     History   Chief Complaint Chief Complaint  Patient presents with  . Fall    HPI Juan Calderon is a 61 y.o. male.  HPI Pt comes in with cc of fall. Pt has multiple medical co morbidities, including CAD, COPD, CHF. He also has poor balance. PT had a fall today. He admits to drinking alcohol. Pt has pain in his R shoulder, elbow and L ribs. PT has no abd pain, back pain, leg pain. Pt takes coumadin.  Past Medical History:  Diagnosis Date  . Alcohol abuse    . C6 radiculopathy 01/24/2016   Right upper extremity, mild to moderate electrically by EMG on 01/24/2016  . Cataract    Left eye  . Chronic diastolic heart failure ([place])     with mild left ventricular hypertrophy on Echo 02/2010  . Chronic obstructive pulmonary disease ([place])    . Chronic osteomyelitis of femur ([employer]) 04/06/2016  . Chronic pain syndrome     Left arm and leg s/p traumatic injury   . Chronic renal insufficiency    . Coronary artery disease     25% LAD stenosis on cath 2007.  Stable angina.  . Diverticulosis    . Essential hypertension    . Gastroesophageal reflux disease    . Gout    . Hyperlipidemia LDL goal < 100    . Long-term current use of opiate analgesic 09/07/2016  . Mild carpal tunnel syndrome of right wrist 01/24/2016   Mild degree electrically per EMG 01/24/2016   . Morbid obesity with BMI of 40.0-44.9, adult ([place])    . Normocytic anemia    . Obstructive sleep apnea     Moderate, AHI 29.8 per hour with moderately loud snoring and oxygen desaturation to a nadir of 79%. CPAP titration resulted in a prescription for 17 CWP.    [name] Open-angle glaucoma    . Osteoarthritis cervical spine    . Osteoarthritis of left knee 06/19/2013   Tricompartmental disease.  Treated with double hinged upright knee brace, steroid/xylocaine knee injections, and NSAIDs   . Paroxysmal atrial fibrillation ([place]) 10/25/2015  . Persistent  atrial fibrillation ([place])    . Right rotator cuff tear     Large full-thickness tear of the supraspinatus with mild retraction but no atrophy   . Secondary male hypogonadism 02/07/2017   Likely secondary to chronic opioid use  . Subclinical hypothyroidism    . 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 [facility])      Patient Active Problem List   Diagnosis Date Noted  . Secondary male hypogonadism 02/07/2017  . Hyperkalemia 01/26/2017  . Hypomagnesemia 12/28/2016  . Long-term current use of opiate analgesic 09/07/2016  . Mild carpal tunnel syndrome of right wrist 01/24/2016  . C6 radiculopathy 01/24/2016  . Paroxysmal atrial fibrillation ([place]) 10/25/2015  . Alcohol abuse 02/28/2015  . Constipation due to opioid therapy 12/24/2014  . Venous stasis ulcer of left lower extremity ([place]) 08/06/2014  . Encounter for therapeutic drug monitoring 11/16/2013  . Diverticulosis 11/12/2013  . Post-traumatic osteoarthritis of left knee 06/19/2013  . Obstructive sleep apnea 06/01/2013  . Osteoarthritis cervical spine 04/25/2013  . Gastroesophageal reflux disease without esophagitis 04/25/2013  . Vasomotor rhinitis 04/25/2013  . Open-angle glaucoma 04/25/2013  . Hyperlipidemia 04/25/2013  . 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 ([place]) 04/25/2013  . Coronary artery disease involving native  coronary artery with angina pectoris ([place]) 04/25/2013  . Chronic asthmatic bronchitis ([place]) 04/25/2013  . Idiopathic chronic gout without tophus 04/25/2013  . Severe obesity with body mass index (BMI) of 35.0 to 39.9 with comorbidity ([place]) 04/25/2013  . Right rotator cuff tear 04/25/2013  . Healthcare maintenance 01/15/2013  . Chronic pain syndrome 01/15/2013  . Chronic diastolic heart failure ([place]) 02/04/2012  . Essential hypertension 09/20/2011    Past Surgical History:  Procedure Laterality Date  . CARDIOVERSION N/A 12/30/2014   Procedure:  CARDIOVERSION;  Surgeon: [name], MD;  Location: [facility] ENDOSCOPY;  Service: Cardiovascular;  Laterality: N/A;  . FRACTURE SURGERY Left 1980's   Elbow  . Left arm surgery    . Left leg surgery    . SHOULDER SURGERY     Right       Home Medications    Prior to Admission medications   Medication Sig Start Date End Date Taking? Authorizing Provider  warfarin (COUMADIN) 5 MG tablet TAKE AS DIRECTED PER COUMADIN CLINIC Patient taking differently: TAKE 2.31m on Mon and Fri and 546mon all other days or AS DIRECTED PER COUMADIN CLINIC 02/01/17  Yes McBurnell BlanksMD  albuterol (PROVENTIL HFA;VENTOLIN HFA) 108 (90 Base) MCG/ACT inhaler Inhale 2 puffs into the lungs every 6 (six) hours as needed for wheezing or shortness of breath. 10/17/16   KrMilagros LollMD  allopurinol (ZYLOPRIM) 100 MG tablet Take 3 tablets (300 mg total) by mouth daily. Please note change in number of tablets to take daily. 03/13/17   KlOval LinseyMD  atorvastatin (LIPITOR) 20 MG tablet Take 1 tablet (20 mg total) by mouth daily. 06/29/16   KlOval LinseyMD  Blood Glucose Monitoring Suppl ([facility]) w/Device KIT 1 each by Does not apply route daily. 11/04/15   FlLoleta ChanceMD  Dextromethorphan-Guaifenesin 20-400 MG/5ML SYRP Take 5 mLs by mouth every 4 (four) hours as needed (cough). Patient not taking: Reported on 01/26/2017 10/17/16   KrMilagros LollMD  flecainide (TAMBOCOR) 100 MG tablet Take 1 tablet (100 mg total) by mouth every 12 (twelve) hours. 12/20/16   KlOval LinseyMD  glucose blood ([facility]) test strip Use as instructed 05/24/16   KlOval LinseyMD  HYDROcodone-acetaminophen (NORCO/VICODIN) 5-325 MG tablet Take 1 tablet by mouth every 6 (six) hours as needed. 05/12/17   NaVarney BilesMD  latanoprost (XALATAN) 0.005 % ophthalmic solution Place 1 drop into both eyes at bedtime. 10/11/16   KlOval LinseyMD  magnesium oxide (MAG-OX) 400 MG tablet Take 2 tablets (800 mg total) by  mouth 2 (two) times daily. 12/28/16   KlOval LinseyMD  metFORMIN (GLUCOPHAGE) 1000 MG tablet Take 1 tablet (1,000 mg total) by mouth 2 (two) times daily with a meal. 12/20/16   KlOval LinseyMD  methocarbamol (ROBAXIN) 500 MG tablet Take 1 tablet (500 mg total) by mouth 2 (two) times daily. 05/12/17   NaVarney BilesMD  metoprolol tartrate (LOPRESSOR) 25 MG tablet Take 1 tablet (25 mg total) by mouth 2 (two) times daily. 06/29/16   KlOval LinseyMD  nitroGLYCERIN (NITROSTAT) 0.4 MG SL tablet Place 1 tablet (0.4 mg total) under the tongue every 5 (five) minutes as needed for chest pain. Patient not taking: Reported on 02/07/2017 06/10/15   KlOval LinseyMD  ON[facility] LtdELICA LANCETS [id number] Use 1 strip daily 05/24/16   KlOval LinseyMD  oxyCODONE-acetaminophen (PERCOCET) 10-325 MG tablet Take 1 tablet by mouth every 6 (six) hours as needed  for pain. 01/25/17   Oval Linsey, MD  sildenafil (VIAGRA) 100 MG tablet Take 1 tablet (100 mg total) by mouth daily as needed for erectile dysfunction. Patient not taking: Reported on 02/07/2017 01/25/17   Oval Linsey, MD  terazosin (HYTRIN) 5 MG capsule Take 1 capsule (5 mg total) by mouth at bedtime. 05/07/17   Oval Linsey, MD  torsemide (DEMADEX) 20 MG tablet TAKE 2 TABLETS BY MOUTH 3 TIMES PER WEEK 03/20/17   Bartholomew Crews, MD    Family History Family History  Problem Relation Age of Onset  . Heart failure Mother   . Alzheimer's disease Father   . Heart failure Brother   . Osteoarthritis Brother   . Prostate cancer Brother   . Early death Brother        Manufacturing systems engineer  . Hypertension Sister   . Hypertension Brother   . Heart attack Neg Hx   . Stroke Neg Hx     Social History Social History  Substance Use Topics  . Smoking status: Never Smoker  . Smokeless tobacco: Never Used  . Alcohol use Yes     Allergies   Ramipril and Testosterone   Review of Systems Review of Systems  Constitutional: Positive for activity  change.  Respiratory: Negative for shortness of breath.   Cardiovascular: Negative for chest pain.  Gastrointestinal: Negative for abdominal pain.  Musculoskeletal: Positive for arthralgias, gait problem and myalgias.  Hematological: Bruises/bleeds easily.     Physical Exam Updated Vital Signs BP (!) 93/59 (BP Location: Left Arm)   Pulse 70   Temp (!) 97.4 F (36.3 C) (Oral)   Resp 20   Ht 6' (1.829 m)   Wt (!) 145.2 kg (320 lb)   SpO2 93%   BMI 43.40 kg/m   Physical Exam  Constitutional: He is oriented to person, place, and time. He appears well-developed.  HENT:  Head: Atraumatic.  Neck: Neck supple.  No midline c-spine tenderness, pt able to turn head to 45 degrees bilaterally without any pain and able to flex neck to the chest and extend without any pain or neurologic symptoms.   Cardiovascular: Normal rate.   Pulmonary/Chest: Effort normal.  Abdominal: Soft. There is no tenderness.  Musculoskeletal:  Pt has tenderness in his R shoulder and  Elbow, also over the L chest wall. OTHERWISE: Head to toe evaluation shows no hematoma, bleeding of the scalp, no facial abrasions, no spine step offs, crepitus of the chest or neck, no tenderness to palpation of the bilateral upper and lower extremities, no gross deformities, no chest tenderness, no pelvic pain.   Neurological: He is alert and oriented to person, place, and time.  Skin: Skin is warm.  Nursing note and vitals reviewed.    ED Treatments / Results  Labs (all labs ordered are listed, but only abnormal results are displayed) Labs Reviewed  COMPREHENSIVE METABOLIC PANEL - Abnormal; Notable for the following:       Result Value   Sodium 132 (*)    Potassium 3.4 (*)    Chloride 100 (*)    CO2 20 (*)    Glucose, Bld 166 (*)    BUN 24 (*)    Creatinine, Ser 1.69 (*)    Calcium 8.5 (*)    Total Protein 6.3 (*)    Albumin 3.4 (*)    GFR calc non Af Amer 42 (*)    GFR calc Af Amer 48 (*)    All other  components within normal  limits  CBC - Abnormal; Notable for the following:    RBC 3.02 (*)    Hemoglobin 9.0 (*)    HCT 27.8 (*)    RDW 15.7 (*)    All other components within normal limits  ETHANOL - Abnormal; Notable for the following:    Alcohol, Ethyl (B) 257 (*)    All other components within normal limits  PROTIME-INR - Abnormal; Notable for the following:    Prothrombin Time 25.6 (*)    All other components within normal limits  I-STAT CHEM 8, ED - Abnormal; Notable for the following:    Potassium 3.4 (*)    Chloride 100 (*)    BUN 27 (*)    Creatinine, Ser 2.00 (*)    Glucose, Bld 164 (*)    Calcium, Ion 1.09 (*)    TCO2 21 (*)    Hemoglobin 10.5 (*)    HCT 31.0 (*)    All other components within normal limits  CDS SEROLOGY  SAMPLE TO BLOOD BANK    EKG  EKG Interpretation None       Radiology Dg Ribs Unilateral W/chest Left  Result Date: 05/12/2017 CLINICAL DATA:  Fall, rib pain. EXAM: LEFT RIBS AND CHEST - 3+ VIEW COMPARISON:  Chest radiograph Jan 26, 2017 FINDINGS: The cardiac silhouette is enlarged, similar to prior examination. Pulmonary vascular congestion. Mediastinal silhouette is nonsuspicious, calcified aortic knob. Low inspiratory examination with crowded vascular markings. Elevated RIGHT hemidiaphragm. No pleural effusion or focal consolidation. No pneumothorax. No rib fracture deformity. IMPRESSION: Cardiomegaly and pulmonary vascular congestion. No acute rib fracture. Electronically Signed   By: Elon Alas M.D.   On: 05/12/2017 03:53   Dg Shoulder Right  Result Date: 05/12/2017 CLINICAL DATA:  Slip and fall injury tonight. Right shoulder and humeral pain with limited range of motion. Left anterior rib pain. EXAM: RIGHT SHOULDER - 2+ VIEW COMPARISON:  MRI right shoulder 05/31/2012 FINDINGS: Acute comminuted spiral fracture of the proximal right humerus extending from the lateral aspect of the humeral head down to the level of the proximal/ mid shaft  region. Mild displacement and overriding of fracture fragments is noted. No evidence of glenohumeral dislocation. Degenerative changes in the glenohumeral joint. Soft tissues are unremarkable. IMPRESSION: Acute comminuted spiral fractures of the proximal right humerus extending from the lateral aspect of the humeral head down to the level of the proximal/mid shaft region. Electronically Signed   By: Lucienne Capers M.D.   On: 05/12/2017 03:51   Dg Elbow 2 Views Right  Result Date: 05/12/2017 CLINICAL DATA:  Slip and fall injury.  Right humeral pain. EXAM: RIGHT ELBOW - 2 VIEW COMPARISON:  None. FINDINGS: There is no evidence of fracture, dislocation, or joint effusion. There is no evidence of arthropathy or other focal bone abnormality. Soft tissues are unremarkable. IMPRESSION: Negative. Electronically Signed   By: Lucienne Capers M.D.   On: 05/12/2017 03:52   Ct Head Wo Contrast  Result Date: 05/12/2017 CLINICAL DATA:  Head trauma, minor, pt on anticoagulation; C-spine trauma, high clinical risk (NEXUS/CCR). Post fall today. EXAM: CT HEAD WITHOUT CONTRAST CT CERVICAL SPINE WITHOUT CONTRAST TECHNIQUE: Multidetector CT imaging of the head and cervical spine was performed following the standard protocol without intravenous contrast. Multiplanar CT image reconstructions of the cervical spine were also generated. COMPARISON:  None. FINDINGS: CT HEAD FINDINGS Brain: No intracranial hemorrhage, mass effect, or midline shift. No hydrocephalus. The basilar cisterns are patent. No evidence of territorial infarct or acute ischemia. No extra-axial  or intracranial fluid collection. Vascular: Atherosclerosis of skullbase vasculature without hyperdense vessel or abnormal calcification. Skull: No skull fracture.  No focal lesion. Sinuses/Orbits: Paranasal sinuses and mastoid air cells are clear. The visualized orbits are unremarkable. Other: None. CT CERVICAL SPINE FINDINGS Technical limitations due to soft tissue  attenuation from body habitus. Additionally there is mild motion artifact. Alignment: No traumatic subluxation. Mild broad-based leftward curvature is likely positioning. Skull base and vertebrae: No acute fracture. Vertebral body heights are maintained. The dens and skull base are intact. Soft tissues and spinal canal: No prevertebral fluid or swelling. No visible canal hematoma. Disc levels: Disc space narrowing and endplate spurring from W2-B7 through C6-C7. Multilevel facet arthropathy. Upper chest: No acute abnormality. Other: Carotid artery calcifications. IMPRESSION: 1.  No acute intracranial abnormality.  No skull fracture. 2. Degenerative change in the cervical spine without acute fracture or subluxation, mild motion artifact limitations. 3. Carotid and skullbase atherosclerosis. Electronically Signed   By: Jeb Levering M.D.   On: 05/12/2017 03:25   Ct Cervical Spine Wo Contrast  Result Date: 05/12/2017 CLINICAL DATA:  Head trauma, minor, pt on anticoagulation; C-spine trauma, high clinical risk (NEXUS/CCR). Post fall today. EXAM: CT HEAD WITHOUT CONTRAST CT CERVICAL SPINE WITHOUT CONTRAST TECHNIQUE: Multidetector CT imaging of the head and cervical spine was performed following the standard protocol without intravenous contrast. Multiplanar CT image reconstructions of the cervical spine were also generated. COMPARISON:  None. FINDINGS: CT HEAD FINDINGS Brain: No intracranial hemorrhage, mass effect, or midline shift. No hydrocephalus. The basilar cisterns are patent. No evidence of territorial infarct or acute ischemia. No extra-axial or intracranial fluid collection. Vascular: Atherosclerosis of skullbase vasculature without hyperdense vessel or abnormal calcification. Skull: No skull fracture.  No focal lesion. Sinuses/Orbits: Paranasal sinuses and mastoid air cells are clear. The visualized orbits are unremarkable. Other: None. CT CERVICAL SPINE FINDINGS Technical limitations due to soft tissue  attenuation from body habitus. Additionally there is mild motion artifact. Alignment: No traumatic subluxation. Mild broad-based leftward curvature is likely positioning. Skull base and vertebrae: No acute fracture. Vertebral body heights are maintained. The dens and skull base are intact. Soft tissues and spinal canal: No prevertebral fluid or swelling. No visible canal hematoma. Disc levels: Disc space narrowing and endplate spurring from S2-G3 through C6-C7. Multilevel facet arthropathy. Upper chest: No acute abnormality. Other: Carotid artery calcifications. IMPRESSION: 1.  No acute intracranial abnormality.  No skull fracture. 2. Degenerative change in the cervical spine without acute fracture or subluxation, mild motion artifact limitations. 3. Carotid and skullbase atherosclerosis. Electronically Signed   By: Jeb Levering M.D.   On: 05/12/2017 03:25   Dg Humerus Right  Result Date: 05/12/2017 CLINICAL DATA:  Slip and fall injury tonight.  Right humeral pain. EXAM: RIGHT HUMERUS - 2+ VIEW COMPARISON:  None. FINDINGS: There is a comminuted spiral oblique fracture of the proximal right humerus extending from the lateral aspect of the humeral head down to the level of the mid shaft. There is medial angulation and mild medial displacement of the largest distal fracture fragment. Mild overriding of fracture fragments. Soft tissue swelling in the upper arm. IMPRESSION: Comminuted spiral oblique fracture of the proximal right humerus extending from the lateral aspect of the humeral head down to the level of the midshaft. Mild medial angulation and displacement of the distal fracture fragment. Electronically Signed   By: Lucienne Capers M.D.   On: 05/12/2017 03:53    Procedures Procedures (including critical care time) SPLINT APPLICATION Date/Time: 1:51 AM  Authorized by: Varney Biles Consent: Verbal consent obtained. Risks and benefits: risks, benefits and alternatives were discussed Consent given  by: patient Splint applied by: orthopedic technician Location details: Left humurus Splint type: sugartong splint, long arm Supplies used: webril, plaster, ace wrap, sling Post-procedure: The splinted body part was neurovascularly unchanged following the procedure. Patient tolerance: Patient tolerated the procedure well with no immediate complications.  ]   Medications Ordered in ED Medications  fentaNYL (SUBLIMAZE) injection 100 mcg (100 mcg Intravenous Given 05/12/17 0702)     Initial Impression / Assessment and Plan / ED Course  I have reviewed the triage vital signs and the nursing notes.  Pertinent labs & imaging results that were available during my care of the patient were reviewed by me and considered in my medical decision making (see chart for details).     Pt comes in with cc of fall. Concerns for rib fracture, shoulder fracture and for pneumothorax. Imaging shows L humeral fracture. Results from the ER workup discussed with the patient face to face and all questions answered to the best of my ability.  Pt advised to be careful with the narcotic pain meds given the high fall risk and chronic conditions.  Final Clinical Impressions(s) / ED Diagnoses   Final diagnoses:  Fall  Closed fracture of shaft of right humerus, unspecified fracture morphology, initial encounter  Rib contusion, left, initial encounter    New Prescriptions New Prescriptions   HYDROCODONE-ACETAMINOPHEN (NORCO/VICODIN) 5-325 MG TABLET    Take 1 tablet by mouth every 6 (six) hours as needed.   METHOCARBAMOL (ROBAXIN) 500 MG TABLET    Take 1 tablet (500 mg total) by mouth 2 (two) times daily.     Varney Biles, MD 05/12/17 [phone]

## 2017-05-13 ENCOUNTER — Ambulatory Visit (INDEPENDENT_AMBULATORY_CARE_PROVIDER_SITE_OTHER): Payer: Medicare Other | Admitting: Internal Medicine

## 2017-05-13 ENCOUNTER — Encounter: Payer: Self-pay | Admitting: Internal Medicine

## 2017-05-13 ENCOUNTER — Telehealth: Payer: Self-pay | Admitting: Internal Medicine

## 2017-05-13 VITALS — BP 129/79 | HR 74 | Temp 97.8°F

## 2017-05-13 DIAGNOSIS — I25119 Atherosclerotic heart disease of native coronary artery with unspecified angina pectoris: Secondary | ICD-10-CM

## 2017-05-13 DIAGNOSIS — Z79891 Long term (current) use of opiate analgesic: Secondary | ICD-10-CM | POA: Diagnosis not present

## 2017-05-13 DIAGNOSIS — W19XXXS Unspecified fall, sequela: Secondary | ICD-10-CM | POA: Diagnosis not present

## 2017-05-13 DIAGNOSIS — N179 Acute kidney failure, unspecified: Secondary | ICD-10-CM | POA: Diagnosis not present

## 2017-05-13 DIAGNOSIS — F1010 Alcohol abuse, uncomplicated: Secondary | ICD-10-CM

## 2017-05-13 DIAGNOSIS — W19XXXA Unspecified fall, initial encounter: Secondary | ICD-10-CM | POA: Insufficient documentation

## 2017-05-13 DIAGNOSIS — I10 Essential (primary) hypertension: Secondary | ICD-10-CM | POA: Diagnosis present

## 2017-05-13 DIAGNOSIS — S42294A Other nondisplaced fracture of upper end of right humerus, initial encounter for closed fracture: Secondary | ICD-10-CM | POA: Diagnosis not present

## 2017-05-13 MED ORDER — OXYCODONE HCL ER 10 MG PO T12A: 10.0000 mg | EXTENDED_RELEASE_TABLET | Freq: Two times a day (BID) | ORAL | 0 refills | Status: DC

## 2017-05-13 NOTE — Assessment & Plan Note (Signed)
Patient says that he got up at 1 AM Monday morning (Sunday night), and he tripped and fell on the same level of floor inside the house. He did not lose consciousness. He went to the ER, where imaging was done, which showed comminuted right humeral fracture. He went to see orthopedics this morning who recommend wearing a sling, and holding off on surgery at this point. He is supposed to follow up with them next week when they will decide whether to do the surgery. Patient's wife says that he needs help with transferring as the bathroom is too small, and they also request a bedside commode, shower chair,. As well as Evaluation by home health for possible PCS.  Plan -follow up with orthopedics -Given 10 tablets of oxycodone 10 mg with no refills -counseled on alcohol use -Given DME shower chair, bedside commode, and home health referral

## 2017-05-13 NOTE — Assessment & Plan Note (Addendum)
We rechecked Magnesium level today.  Plan -Obtain Magnesium  Addendum: Mag is WNL

## 2017-05-13 NOTE — Progress Notes (Signed)
    CC: Fall, AKI, HTN, alcohol abuse, hypomagnesemia, acute on chronic pain due to injury  HPI: [name] is a 61 y.o. man with PMH noted below here for fall, AKI, HTN, alcohol abuse, hypomagnesemia, acute on chronic pain due to injury   Please see Problem List/A&P for the status of the patient's chronic medical problems   Past Medical History:  Diagnosis Date  . Alcohol abuse    . C6 radiculopathy 01/24/2016   Right upper extremity, mild to moderate electrically by EMG on 01/24/2016  . Cataract    Left eye  . Chronic diastolic heart failure ([place])     with mild left ventricular hypertrophy on Echo 02/2010  . Chronic obstructive pulmonary disease ([place])    . Chronic osteomyelitis of femur ([place]) 04/06/2016  . Chronic pain syndrome     Left arm and leg s/p traumatic injury   . Chronic renal insufficiency    . Coronary artery disease     25% LAD stenosis on cath 2007.  Stable angina.  . Diverticulosis    . Essential hypertension    . Gastroesophageal reflux disease    . Gout    . Hyperlipidemia LDL goal < 100    . Long-term current use of opiate analgesic 09/07/2016  . Mild carpal tunnel syndrome of right wrist 01/24/2016   Mild degree electrically per EMG 01/24/2016   . Morbid obesity with BMI of 40.0-44.9, adult ([place])    . Normocytic anemia    . Obstructive sleep apnea     Moderate, AHI 29.8 per hour with moderately loud snoring and oxygen desaturation to a nadir of 79%. CPAP titration resulted in a prescription for 17 CWP.    [name] Open-angle glaucoma    . Osteoarthritis cervical spine    . Osteoarthritis of left knee 06/19/2013   Tricompartmental disease.  Treated with double hinged upright knee brace, steroid/xylocaine knee injections, and NSAIDs   . Paroxysmal atrial fibrillation ([place]) 10/25/2015  . Persistent atrial fibrillation ([place])    . Right rotator cuff tear     Large full-thickness tear of the supraspinatus with mild retraction but no atrophy   . Secondary male  hypogonadism 02/07/2017   Likely secondary to chronic opioid use  . Subclinical hypothyroidism    . 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 ([facility])      Review of Systems: Denies fevers, chills, fatigue Has right shoulder pain. Has chronic pain  Denies n/v/abd pain Denies abnormal bleeding Denies further falls since being discharged from the ER Denies chest pain or shortness of breath , headaches or dizziness    Physical Exam: Vitals:   05/13/17 1344  BP: 129/79  Pulse: 74  Temp: 97.8 F (36.6 C)  TempSrc: Oral  SpO2: 97%    General: A&O, in NAD, in wheelchair, morbidly obese Neck: supple, midline trachea, CV: RRR, normal s1, s2, no m/r/g,  Resp: equal and symmetric breath sounds, no wheezing or crackles  MSK: right shoulder sling.  Abdomen: soft, nontender, nondistended, +BS   Assessment & Plan:   See encounters tab for problem based medical decision making. Patient discussed with Dr. Dareen Piano

## 2017-05-13 NOTE — Assessment & Plan Note (Addendum)
BMET in the ER noted Cr of 2 and K of 3.4. Patient says he stays well hydrated but did not eat today.   Plan -check BMET   Addendum 9/12 Cr has normalized

## 2017-05-13 NOTE — Assessment & Plan Note (Signed)
Patient says that prior to him falling on Sunday night, he drank 5 glasses of alcohol. Wife says that he does not drink often. His EtOh level was high in the ER. I reminded the patient that heavy alcohol use will exacerbate gait instability and can lead to more falls.   Plan -continue to monitor

## 2017-05-13 NOTE — Assessment & Plan Note (Signed)
BP Readings from Last 3 Encounters:  05/13/17 129/79  05/12/17 (!) 93/59  02/07/17 104/60   Patient's blood pressure is at goal on the metoprolol and the terazosin. I verified that he is not taking the valsartan.  Plan -continue metoprolol and terazosin at the current dose.

## 2017-05-13 NOTE — Telephone Encounter (Signed)
Patient seen in [facility] yesterday requesting [place] vs [place] orders.  Pt

## 2017-05-13 NOTE — Assessment & Plan Note (Addendum)
Patient is currently on 4 tablets of the oxycodone-tylenol daily. Due to his proximal humeral fracture, his pain level was elevated. Given his fall, we will provide him with oxycodone 10 mg  # 10 tablets- he will take 2 additional tablets on top of his current regimen for the next 5 days. He is also due to follow up with orthopedics surgery. Discussed with Dr. Dareen Piano. [facility] database reviewed and shows appropriate dispensing  Plan -prescribed oxycodone 10 mg  10 tablets.

## 2017-05-13 NOTE — Patient Instructions (Signed)
Thank you for your visit today  I have ordered the home health and the supplies you requested  I will call you for your lab results  We have given you 10 tablets of the oxycontin- so an extra two tablets per day for the next 5 days  Please follow up with orthopedics for continuing evaluation of the fracture

## 2017-05-14 ENCOUNTER — Encounter: Payer: Self-pay | Admitting: Internal Medicine

## 2017-05-14 DIAGNOSIS — M810 Age-related osteoporosis without current pathological fracture: Secondary | ICD-10-CM

## 2017-05-14 HISTORY — DX: Age-related osteoporosis without current pathological fracture: M81.0

## 2017-05-14 LAB — BMP8+ANION GAP
ANION GAP: 18 mmol/L (ref 10.0–18.0)
BUN/Creatinine Ratio: 21 (ref 10–24)
BUN: 25 mg/dL (ref 8–27)
CALCIUM: 9 mg/dL (ref 8.6–10.2)
CO2: 20 mmol/L (ref 20–29)
Chloride: 97 mmol/L (ref 96–106)
Creatinine, Ser: 1.19 mg/dL (ref 0.76–1.27)
GFR calc Af Amer: 75 mL/min/{1.73_m2} (ref >59)
GFR, EST NON AFRICAN AMERICAN: 65 mL/min/{1.73_m2} (ref >59)
Glucose: 149 mg/dL — ABNORMAL HIGH (ref 65–99)
POTASSIUM: 5.2 mmol/L (ref 3.5–5.2)
Sodium: 135 mmol/L (ref 134–144)

## 2017-05-14 LAB — MAGNESIUM: Magnesium: 1.6 mg/dL (ref 1.6–2.3)

## 2017-05-15 ENCOUNTER — Telehealth: Payer: Self-pay | Admitting: *Deleted

## 2017-05-15 DIAGNOSIS — N179 Acute kidney failure, unspecified: Secondary | ICD-10-CM | POA: Diagnosis not present

## 2017-05-15 DIAGNOSIS — S42201D Unspecified fracture of upper end of right humerus, subsequent encounter for fracture with routine healing: Secondary | ICD-10-CM | POA: Diagnosis not present

## 2017-05-15 NOTE — Telephone Encounter (Signed)
Per Simona Huh [facility])- will continue therapy 2x/wk for 3 wks. Patient doing well.

## 2017-05-16 NOTE — Progress Notes (Signed)
Internal Medicine Clinic Attending  Case discussed with Dr. Saraiya at the time of the visit.  We reviewed the resident's history and exam and pertinent patient test results.  I agree with the assessment, diagnosis, and plan of care documented in the resident's note.  

## 2017-05-17 DIAGNOSIS — S42201D Unspecified fracture of upper end of right humerus, subsequent encounter for fracture with routine healing: Secondary | ICD-10-CM | POA: Diagnosis not present

## 2017-05-17 DIAGNOSIS — N179 Acute kidney failure, unspecified: Secondary | ICD-10-CM | POA: Diagnosis not present

## 2017-05-20 DIAGNOSIS — S42201D Unspecified fracture of upper end of right humerus, subsequent encounter for fracture with routine healing: Secondary | ICD-10-CM | POA: Diagnosis not present

## 2017-05-20 DIAGNOSIS — N179 Acute kidney failure, unspecified: Secondary | ICD-10-CM | POA: Diagnosis not present

## 2017-05-21 DIAGNOSIS — S42294D Other nondisplaced fracture of upper end of right humerus, subsequent encounter for fracture with routine healing: Secondary | ICD-10-CM | POA: Diagnosis not present

## 2017-05-24 ENCOUNTER — Ambulatory Visit (INDEPENDENT_AMBULATORY_CARE_PROVIDER_SITE_OTHER): Payer: Medicare Other | Admitting: Internal Medicine

## 2017-05-24 ENCOUNTER — Encounter: Payer: Self-pay | Admitting: Internal Medicine

## 2017-05-24 VITALS — BP 110/64 | HR 62 | Temp 97.9°F | Wt 299.0 lb

## 2017-05-24 DIAGNOSIS — E785 Hyperlipidemia, unspecified: Secondary | ICD-10-CM

## 2017-05-24 DIAGNOSIS — Z7901 Long term (current) use of anticoagulants: Secondary | ICD-10-CM

## 2017-05-24 DIAGNOSIS — I872 Venous insufficiency (chronic) (peripheral): Secondary | ICD-10-CM

## 2017-05-24 DIAGNOSIS — Z87828 Personal history of other (healed) physical injury and trauma: Secondary | ICD-10-CM

## 2017-05-24 DIAGNOSIS — L97929 Non-pressure chronic ulcer of unspecified part of left lower leg with unspecified severity: Secondary | ICD-10-CM

## 2017-05-24 DIAGNOSIS — S42301D Unspecified fracture of shaft of humerus, right arm, subsequent encounter for fracture with routine healing: Secondary | ICD-10-CM | POA: Diagnosis not present

## 2017-05-24 DIAGNOSIS — I10 Essential (primary) hypertension: Secondary | ICD-10-CM

## 2017-05-24 DIAGNOSIS — E1149 Type 2 diabetes mellitus with other diabetic neurological complication: Secondary | ICD-10-CM | POA: Diagnosis not present

## 2017-05-24 DIAGNOSIS — I110 Hypertensive heart disease with heart failure: Secondary | ICD-10-CM | POA: Diagnosis not present

## 2017-05-24 DIAGNOSIS — Z79891 Long term (current) use of opiate analgesic: Secondary | ICD-10-CM

## 2017-05-24 DIAGNOSIS — I480 Paroxysmal atrial fibrillation: Secondary | ICD-10-CM

## 2017-05-24 DIAGNOSIS — I5032 Chronic diastolic (congestive) heart failure: Secondary | ICD-10-CM

## 2017-05-24 DIAGNOSIS — Z6841 Body Mass Index (BMI) 40.0 and over, adult: Secondary | ICD-10-CM

## 2017-05-24 DIAGNOSIS — W1830XD Fall on same level, unspecified, subsequent encounter: Secondary | ICD-10-CM | POA: Diagnosis not present

## 2017-05-24 DIAGNOSIS — J449 Chronic obstructive pulmonary disease, unspecified: Secondary | ICD-10-CM

## 2017-05-24 DIAGNOSIS — N521 Erectile dysfunction due to diseases classified elsewhere: Secondary | ICD-10-CM

## 2017-05-24 DIAGNOSIS — E1140 Type 2 diabetes mellitus with diabetic neuropathy, unspecified: Secondary | ICD-10-CM

## 2017-05-24 DIAGNOSIS — M1A00X0 Idiopathic chronic gout, unspecified site, without tophus (tophi): Secondary | ICD-10-CM

## 2017-05-24 DIAGNOSIS — I83029 Varicose veins of left lower extremity with ulcer of unspecified site: Secondary | ICD-10-CM

## 2017-05-24 DIAGNOSIS — K5903 Drug induced constipation: Secondary | ICD-10-CM

## 2017-05-24 DIAGNOSIS — M8080XD Other osteoporosis with current pathological fracture, unspecified site, subsequent encounter for fracture with routine healing: Secondary | ICD-10-CM

## 2017-05-24 DIAGNOSIS — S42301A Unspecified fracture of shaft of humerus, right arm, initial encounter for closed fracture: Secondary | ICD-10-CM | POA: Insufficient documentation

## 2017-05-24 DIAGNOSIS — I25118 Atherosclerotic heart disease of native coronary artery with other forms of angina pectoris: Secondary | ICD-10-CM | POA: Diagnosis not present

## 2017-05-24 DIAGNOSIS — Z7984 Long term (current) use of oral hypoglycemic drugs: Secondary | ICD-10-CM

## 2017-05-24 DIAGNOSIS — S42201D Unspecified fracture of upper end of right humerus, subsequent encounter for fracture with routine healing: Secondary | ICD-10-CM

## 2017-05-24 DIAGNOSIS — T402X5A Adverse effect of other opioids, initial encounter: Secondary | ICD-10-CM

## 2017-05-24 DIAGNOSIS — G894 Chronic pain syndrome: Secondary | ICD-10-CM

## 2017-05-24 DIAGNOSIS — I25119 Atherosclerotic heart disease of native coronary artery with unspecified angina pectoris: Secondary | ICD-10-CM

## 2017-05-24 DIAGNOSIS — Z23 Encounter for immunization: Secondary | ICD-10-CM

## 2017-05-24 DIAGNOSIS — R269 Unspecified abnormalities of gait and mobility: Secondary | ICD-10-CM

## 2017-05-24 DIAGNOSIS — K219 Gastro-esophageal reflux disease without esophagitis: Secondary | ICD-10-CM

## 2017-05-24 DIAGNOSIS — Z79899 Other long term (current) drug therapy: Secondary | ICD-10-CM

## 2017-05-24 DIAGNOSIS — E7849 Other hyperlipidemia: Secondary | ICD-10-CM

## 2017-05-24 LAB — POCT GLYCOSYLATED HEMOGLOBIN (HGB A1C): HEMOGLOBIN A1C: 6.6

## 2017-05-24 LAB — GLUCOSE, CAPILLARY: Glucose-Capillary: 141 mg/dL — ABNORMAL HIGH (ref 65–99)

## 2017-05-24 MED ORDER — OXYCODONE-ACETAMINOPHEN 10-325 MG PO TABS: 1.0000 | ORAL_TABLET | Freq: Four times a day (QID) | ORAL | 0 refills | Status: DC | PRN

## 2017-05-24 MED ORDER — METOPROLOL TARTRATE 25 MG PO TABS: 25.0000 mg | ORAL_TABLET | Freq: Two times a day (BID) | ORAL | 3 refills | Status: DC

## 2017-05-24 MED ORDER — ESOMEPRAZOLE MAGNESIUM 40 MG PO CPDR: 40.0000 mg | DELAYED_RELEASE_CAPSULE | Freq: Every day | ORAL | 3 refills | Status: DC

## 2017-05-24 MED ORDER — ATORVASTATIN CALCIUM 20 MG PO TABS: 20.0000 mg | ORAL_TABLET | Freq: Every day | ORAL | 3 refills | Status: DC

## 2017-05-24 MED ORDER — DEXTROMETHORPHAN-GUAIFENESIN 20-400 MG/5ML PO SYRP: 5.0000 mL | ORAL_SOLUTION | ORAL | 0 refills | Status: DC | PRN

## 2017-05-24 NOTE — Assessment & Plan Note (Signed)
Assessment  His chronic diastolic heart failure is well compensated at this time despite the increasing lower extremity edema. This is on metoprolol 25 mg by mouth twice daily and torsemide 40 mg by mouth 3 times a week. The lower extremity edema is likely related to dependence of his extremities since he cannot lay flat with resultant worsening of his chronic venous insufficiency rather than decompensation of his chronic diastolic heart failure.  Plan  We will continue metoprolol at 25 mg by mouth daily and torsemide at 40 mg by mouth 3 times a week. Recent electrolytes and kidney function studies were unremarkable. We will reassess the efficacy of the metoprolol and torsemide in managing his chronic diastolic heart failure at the follow-up visit.

## 2017-05-24 NOTE — Assessment & Plan Note (Signed)
Assessment  He suffered a right humeral fracture after a fall from standing height just under 2 weeks ago. His alcohol level was high at the time and may have contributed to the fall. Nonetheless, he required assessment by orthopedic surgery and has been placed in a brace. Because of this brace he has been unable to lie flat in bed and has had to sleep in a recliner. He also required a transient increase in his opiate medication and was started on a short course of OxyContin which led to some constipation. He continues to have some pain although he no longer requires the OxyContin and his constipation has improved. He was given a prescription for a particular type of brace which the medical supply company did not have.  Plan  We appreciate orthopedic surgery's assessment and placement of a new brace. He is scheduled to follow-up with their clinic on Monday. Further intervention will be as per their recommendations.

## 2017-05-24 NOTE — Assessment & Plan Note (Signed)
Assessment  His blood pressure is well controlled today at 110/64. This is on metoprolol 25 mg by mouth twice daily and terazosin 5 mg by mouth at night.  Plan  We will continue metoprolol 25 mg by mouth twice daily and terazosin 5 mg by mouth at night. We will reassess the efficacy of this regimen in controlling his blood pressure at the follow-up visit.

## 2017-05-24 NOTE — Assessment & Plan Note (Signed)
Assessment  His diabetes is well controlled today with a hemoglobin A1c of 6.6. This is on metformin 1000 mg by mouth twice daily. We did not address his erectile dysfunction.  Plan  He was praised on his control of his diabetes. We will continue the metformin at 1000 mg by mouth twice daily. We will also continue the Viagra for the erectile dysfunction. We will reassess the control of his diabetes on this regimen at the follow-up visit with a repeat hemoglobin A1c. A urine for microalbumin was obtained and is pending at the time of this dictation.

## 2017-05-24 NOTE — Progress Notes (Signed)
   Subjective:    Patient ID: Juan Calderon, male    DOB: [birth year], 61 y.o.   MRN: [id number]  HPI  Juan Calderon is here for follow-up of his fractured right humerus, chronic diastolic heart failure, chronic pain syndrome, gastroesophageal reflux disease, diabetes, hyperlipidemia, coronary artery disease, gout, obesity, chronic venous stasis, paroxysmal atrial fibrillation, and hypomagnesemia.  Please see the A&P for the status of the pt's chronic medical problems.  Review of Systems  Constitutional: Positive for activity change and unexpected weight change.       After fractured right humerus he has been less active.  Having to sleep in a recliner has resulted in worsening of his chronic venous insufficiency and resulted in a 7 pound weight gain.  Respiratory: Negative for cough, chest tightness, shortness of breath and wheezing.   Cardiovascular: Positive for leg swelling. Negative for chest pain and palpitations.  Gastrointestinal: Positive for constipation. Negative for abdominal pain, diarrhea, nausea and vomiting.  Musculoskeletal: Positive for arthralgias, back pain and gait problem. Negative for joint swelling and myalgias.  Skin: Negative for rash and wound.  Neurological: Negative for dizziness, syncope, weakness and light-headedness.      Objective:   Physical Exam  Constitutional: He is oriented to person, place, and time. He appears well-developed and well-nourished. No distress.  HENT:  Head: Normocephalic and atraumatic.  Eyes: Conjunctivae are normal. Right eye exhibits no discharge. Left eye exhibits no discharge. No scleral icterus.  Cardiovascular: Normal rate, regular rhythm and normal heart sounds.  Exam reveals no gallop and no friction rub.   No murmur heard. Pulmonary/Chest: Effort normal and breath sounds normal. No respiratory distress. He has no wheezes. He has no rales.  Abdominal: Soft. Bowel sounds are normal. He exhibits no distension. There is no  tenderness. There is no rebound and no guarding.  Musculoskeletal: Normal range of motion. He exhibits edema and deformity.  Neurological: He is alert and oriented to person, place, and time. He exhibits normal muscle tone.  Skin: Skin is warm and dry. No rash noted. He is not diaphoretic. No erythema.  Psychiatric: He has a normal mood and affect. His behavior is normal. Judgment and thought content normal.  Nursing note and vitals reviewed.     Assessment & Plan:   Please see problem oriented charting.

## 2017-05-24 NOTE — Assessment & Plan Note (Signed)
He was given the flu shot today.

## 2017-05-24 NOTE — Progress Notes (Signed)
Applied shoulder immobilizer to pt right arm.

## 2017-05-24 NOTE — Assessment & Plan Note (Signed)
Assessment  Chronic pain syndrome of the left arm and leg status post traumatic injury is well-controlled on the Percocet 10-325 mg 1 tablet every 6 hours as needed dispense #105 per month. He is able to perform his activities of daily living with this therapy.  Plan  We will continue with the Percocet 10-325 mg 1 tablet every 6 hours as needed dispense #105 per month and reassess if this allows continued function at the follow-up visit. He was given 3 prescriptions which should get him through mid December.

## 2017-05-24 NOTE — Assessment & Plan Note (Signed)
Assessment  His weight is up 7 pounds from the last time I saw him approximately 3 months ago. I believe all of this is actually water weight. He has significant pitting edema and both lower extremities, left greater than right. As noted elsewhere, this is likely related to worsening of his chronic venous insufficiency with the need to sleep in a recliner with his legs in a dependent position.  Plan  We are waiting until he is able to sleep in a regular bed which should allow diuresis and return back to his baseline weight. The importance of further weight loss was highlighted given his multiple cor-morbidities that are impacted by his weight. We will reassess the success in further weight loss at the follow-up visit.

## 2017-05-24 NOTE — Assessment & Plan Note (Signed)
Assessment  His chronic asthmatic bronchitis is very well controlled on infrequent albuterol MDI therapy. In fact, he states he rarely requires the albuterol MDIs.  Plan  We will continue with the as needed albuterol and reassess the efficacy of this in controlling his chronic asthmatic bronchitis at the follow-up visit.

## 2017-05-24 NOTE — Assessment & Plan Note (Signed)
Assessment  He continues to have symptomatic gastroesophageal reflux disease. This is not helped by his weight. His magnesium has been relatively stable with the high-dose supplementation.  Plan  We will start esomeprazole 40 mg by mouth daily and reassess the efficacy of this therapy in managing his gastroesophageal reflux symptoms at the follow-up visit.

## 2017-05-24 NOTE — Patient Instructions (Signed)
It was good to see you again.  I am sorry to see that you broke your arm.  1) We are working on finding you a large sling for your arm as ordered by the Orthopedic physician.  2) I restarted your Nexium 40 mg daily for your reflux.  3) I renewed your percocet and gave you three written prescriptions to get you through mid December.  4) Keep taking the other medications as you have been.  5) I renewed all medications that were do for renewal.  6) We gave you the flu shot today.  7) I will see you in 3 months, sooner if necessary.

## 2017-05-24 NOTE — Assessment & Plan Note (Signed)
Assessment  A recent magnesium level was 1.6 on magnesium sulfate 800 mg by mouth twice daily. He continues to suffer from his gastroesophageal reflux disease.  Plan  We will start he's omeprazole 40 mg by mouth daily and assess his magnesium at the follow-up visit to assure that the PPI therapy in combination with the loop diuretics does not further exacerbate his hypomagnesemia while on maximum magnesium oxide supplementation.

## 2017-05-24 NOTE — Assessment & Plan Note (Signed)
Assessment  He has not had any symptomatic palpitations suggesting he has flipped back into paroxysmal atrial fibrillation. Physical examination revealed a regular rhythm. This is on flecainide 100 mg by mouth twice daily, metoprolol 25 mg by mouth twice daily, and warfarin to maintain an INR between 2 and 3.  Plan  We will continue the flecainide at 100 mg by mouth twice daily and the metoprolol 25 mg by mouth twice daily should he flipped back into atrial fibrillation. He will also be chronically anticoagulated with warfarin and is following in the anticoagulation clinic. We will reassess the status of his paroxysmal atrial fibrillation clinically at the follow-up visit.

## 2017-05-24 NOTE — Assessment & Plan Note (Signed)
Assessment  He continues to have chronic venous stasis changes in the lower extremities, left greater than right, that has been exacerbated by his need to sleep in a recliner with his legs in a dependent position.  Plan  We are hopeful that he will be able to lie flat in the very near future. This will allow a decrease in his chronic venous insufficiency overnight and an improvement in his weight as this fluid gets diuresed off. We will not escalate his diuretics at this time as he is asymptomatic from his chronic diastolic heart failure and there is the risk to worsen his hypomagnesemia as well as his renal function. We will reassess his chronic venous stasis changes at the follow-up visit.

## 2017-05-24 NOTE — Assessment & Plan Note (Signed)
Assessment  He is tolerating the atorvastatin 20 mg by mouth daily without myalgias.  Plan  We will continue this moderate intensity statin and reassess for intolerances at the follow-up visit.

## 2017-05-24 NOTE — Assessment & Plan Note (Signed)
Assessment  By definition he has osteoporosis given that he had a fracture of his right humerus from a fall at ground height. As we are unclear as to what the orthopedic intervention will be we are holding off on starting bisphosphonate therapy at this time, particularly given his reflux disease.  Plan  At the follow-up visit we will reassess whether it is the appropriate time to begin bisphosphonate therapy. He will require a DEXA scan to quantify his baseline bone density as we assess the efficacy of the bisphosphonate therapy moving forward.

## 2017-05-24 NOTE — Assessment & Plan Note (Signed)
Assessment  He has not had any angina on his current antianginal regimen. This includes metoprolol 25 mg by mouth twice daily. He has not required any sublingual nitroglycerin that has some available should this change.  Plan  We will continue metoprolol 25 mg by mouth twice daily and continue to address modifiable risk factors including the hyperlipidemia and diabetes. We will reassess the efficacy of the metoprolol in managing his angina at the follow-up visit.

## 2017-05-24 NOTE — Assessment & Plan Note (Signed)
Assessment  He has had no interim gouty flares on the allopurinol 300 mg by mouth daily.  Plan  We will continue the allopurinol 300 mg by mouth daily and reassess for intercurrent flares at the follow-up visit.

## 2017-05-25 LAB — MICROALBUMIN / CREATININE URINE RATIO
CREATININE, UR: 225 mg/dL
MICROALB/CREAT RATIO: 2 mg/g{creat} (ref 0.0–30.0)
MICROALBUM., U, RANDOM: 4.5 ug/mL

## 2017-05-27 DIAGNOSIS — N179 Acute kidney failure, unspecified: Secondary | ICD-10-CM | POA: Diagnosis not present

## 2017-05-27 DIAGNOSIS — S42201D Unspecified fracture of upper end of right humerus, subsequent encounter for fracture with routine healing: Secondary | ICD-10-CM | POA: Diagnosis not present

## 2017-05-28 ENCOUNTER — Telehealth: Payer: Self-pay

## 2017-05-28 NOTE — Telephone Encounter (Signed)
[facility] home health form is faxed 05/28/2017.

## 2017-05-30 DIAGNOSIS — N179 Acute kidney failure, unspecified: Secondary | ICD-10-CM | POA: Diagnosis not present

## 2017-05-30 DIAGNOSIS — S42201D Unspecified fracture of upper end of right humerus, subsequent encounter for fracture with routine healing: Secondary | ICD-10-CM | POA: Diagnosis not present

## 2017-05-30 DIAGNOSIS — S42294D Other nondisplaced fracture of upper end of right humerus, subsequent encounter for fracture with routine healing: Secondary | ICD-10-CM | POA: Diagnosis not present

## 2017-06-02 ENCOUNTER — Other Ambulatory Visit: Payer: Self-pay | Admitting: Internal Medicine

## 2017-06-02 DIAGNOSIS — K219 Gastro-esophageal reflux disease without esophagitis: Secondary | ICD-10-CM

## 2017-06-08 ENCOUNTER — Other Ambulatory Visit: Payer: Self-pay | Admitting: Internal Medicine

## 2017-06-08 DIAGNOSIS — M1A00X0 Idiopathic chronic gout, unspecified site, without tophus (tophi): Secondary | ICD-10-CM

## 2017-06-10 ENCOUNTER — Telehealth: Payer: Self-pay

## 2017-06-10 NOTE — Telephone Encounter (Signed)
Juan Calderon was instructed to stop the valsartan when he was found to be hyperkalemic in the ED.  A subsequent BMP revealed resolution of the hyperkalemia with the discontinuance of the valsartan and amiloride.  At his last visit with me his blood pressure was outstanding on just the metoprolol and the terazosin.  Therefore, the valsartan was not restarted.  He is not to take it at this time.  Since he has run out, this should no longer be an issue as I will not rewrite it.  I tried calling his mobile number and received an unidentified voice mail, so no message was left.  I called his home number and received an identified voice mail, but the mailbox was full and not accepting any questions.  Please call Juan Calderon to let him know the valsartan was stopped when his potassium was high and he is not supposed to take it any further, until notified otherwise at an appointment.  The medication was not re-prescribed at this time.  Thank you.

## 2017-06-10 NOTE — Telephone Encounter (Signed)
Requesting to speak with a nurse about meds. Please call pt back.  

## 2017-06-10 NOTE — Telephone Encounter (Signed)
Pt states pharmacy had rx on file for Valsartan 160mg  and it was filled.  He has taken the medication for about two days with twice a day dosing.  Pt is now calling because he thought it was discontinued.  Per pt's chart, medication was discontinued during 02/07/2017 office visit with pcp.  Pt instructed to hold medication until I speak with pcp for clarification.  Please advise.[name]10/8/20184:35 PM

## 2017-06-11 ENCOUNTER — Telehealth: Payer: Self-pay

## 2017-06-11 NOTE — Telephone Encounter (Signed)
Faxed [employer] form 06/11/2017.

## 2017-06-11 NOTE — Progress Notes (Signed)
Patient ID: Juan Calderon, male   DOB: 26-Jul-1956, 61 y.o.   MRN: [id number]  Urine creatinine/microalbumin ratio 2.0 which is well within the normal range.  He remains off of the ARB secondary to previous hyperkalemia.  His blood pressure is outstanding off of it as well.  We will continue to holds on the valsartan for no given the blood pressure and the recent history of hyperkalemia.

## 2017-06-16 NOTE — Progress Notes (Signed)
Cardiology Office Note Date:  06/18/2017  Patient ID:  Juan Calderon, Juan Calderon 20-Oct-1955, MRN [id number] PCP:  Oval Linsey, MD  Cardiologist:  Dr. Julianne Handler Electrophysiologist: Dr. [name]   Chief Complaint: planned f/u  History of Present Illness: Juan Calderon is a 61 y.o. male with history of chronic CHF (Diastolic), CAD (non-obstructive by cath in 2007), persistent AFib, COPD, HTN, HLD, gout, GERD, DM, chronic venous stasis (PMD is managing)  March 2016 and had c/o dyspnea on exertion. Stress myoview 11/25/14 with no ischemia. 24 hour monitor showed atrial fib with bradycardia (rates as low as 40 bpm), frequent PVCs (9000 in 24 hours) and non-sustained VT. He was seen by Dr. [name] 12/23/14 and was started on Flecainide. He underwent DCCV on 12/30/14. His Lasix was changed to Torsemide. He was admitted to [facility] June 2016 with renal failure felt to be due to over-diuresis with torsemide. Echo May 2016 with LVEF=55-60%. He was seen by Dr. [name] August 2016 and his Lasix was restarted. He has been continued on Flecainide  The patient was seen in the ER 10/27 with c/o CP, he states started while at the coumadin check and resolved by the time he reached the ER.  Described as sharp b/l chest at the lower rib borders, not radiating, and no associated symptoms.  He was resumed on his metoprolol and has remained CP free. Had a Trop that was 0.00 and no EKG changes. We discussed at length his c/o SOB. His meds reviewed on his torsemide 3x week.  His cxr at the ER had some congestion, no edema.  He tells me that his SOB is chronic now for many years, not changed or escalating, no rest SOB, no nighttime symptoms.  He has COPD he was never a smoke but has second hand exposure which unfortunately still does.  He denies feeling like he has LE swelling or bloating/water retention.  He denies dizziness, near syncope or syncope.  No bleeding or signs of bleeding reported.  He was seen by myself after his ER visit,  HR was 60bpm, had not had any recurrent CP and planned for a monitor that was not completed, he saw Dr. [name] in April 2018, no ongoing symptoms, HR w/ambulation increased 80's after ambulation in the hallway, no changes were made to his therapy.  05/12/17 had a fall an L humerous fracture.  He states his L leg gave out fell and broke his arm, being managed with splinting.  In discussion he mentions that he did put his arm out to stop the fall but remembers nothing after that until he was being put into the ambulance, and uncertain exactly what happened.  He denies any kind of CP, palpitations with the event or days/weeks prior, or since.  He feels like his breathing is at his baseline.  He denies hx of syncope, no dizzy spells, near syncope since.  He has not been able to afford the oral device for is sleep apnea yet,  He reports his ETOH as infrequent though historically was more.  He denies any bleeding or signs of bleeding, getting his coumadin managed with the coumadin clinic.  There is report of him sleeping in a recliner, but he states that is only after the arm fracture, was where he was most comfortable in the least pain.  Usually sleeps in his bead without symptoms of PND or orthopnea.   AFib Hx: DCCV 2016 AAD: Flecainide, 2016 Hx of digoxin stopped with rates 30's and BB was  reduced 2016 Intolerant of CPAP pending perhaps an oral device BB was stopped 05/2016 ? Bradycardia resumed in 2017 after a CP episode  Past Medical History:  Diagnosis Date  . Alcohol abuse    . C6 radiculopathy 01/24/2016   Right upper extremity, mild to moderate electrically by EMG on 01/24/2016  . Cataract    Left eye  . Chronic diastolic heart failure ([place])     with mild left ventricular hypertrophy on Echo 02/2010  . Chronic obstructive pulmonary disease ([place])    . Chronic osteomyelitis of femur ([place]) 04/06/2016  . Chronic pain syndrome     Left arm and leg s/p traumatic injury   . Chronic renal insufficiency     . Coronary artery disease     25% LAD stenosis on cath 2007.  Stable angina.  . Diverticulosis    . Essential hypertension    . Gastroesophageal reflux disease    . Gout    . Hyperlipidemia LDL goal < 100    . Long-term current use of opiate analgesic 09/07/2016  . Mild carpal tunnel syndrome of right wrist 01/24/2016   Mild degree electrically per EMG 01/24/2016   . Morbid obesity with BMI of 40.0-44.9, adult ([place])    . Normocytic anemia    . Obstructive sleep apnea     Moderate, AHI 29.8 per hour with moderately loud snoring and oxygen desaturation to a nadir of 79%. CPAP titration resulted in a prescription for 17 CWP.    [name] Open-angle glaucoma    . Osteoarthritis cervical spine    . Osteoarthritis of left knee 06/19/2013   Tricompartmental disease.  Treated with double hinged upright knee brace, steroid/xylocaine knee injections, and NSAIDs   . Osteoporosis 05/14/2017   s/p fracture of the right humerus from a fall at ground hight  . Paroxysmal atrial fibrillation ([place]) 10/25/2015  . Persistent atrial fibrillation ([place])    . Right rotator cuff tear     Large full-thickness tear of the supraspinatus with mild retraction but no atrophy   . Secondary male hypogonadism 02/07/2017   Likely secondary to chronic opioid use  . Subclinical hypothyroidism    . 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 [facility])      Past Surgical History:  Procedure Laterality Date  . CARDIOVERSION N/A 12/30/2014   Procedure: CARDIOVERSION;  Surgeon: [name], MD;  Location: [facility] ENDOSCOPY;  Service: Cardiovascular;  Laterality: N/A;  . FRACTURE SURGERY Left 1980's   Elbow  . Left arm surgery    . Left leg surgery    . SHOULDER SURGERY     Right    Current Outpatient Prescriptions  Medication Sig Dispense Refill  . albuterol (PROVENTIL HFA;VENTOLIN HFA) 108 (90 Base) MCG/ACT inhaler Inhale 2 puffs into the lungs every 6 (six) hours as needed for wheezing or shortness of  breath. 1 Inhaler 0  . allopurinol (ZYLOPRIM) 100 MG tablet Take 3 tablets (300 mg total) by mouth daily. Please note change in number of tablets to take daily. 270 tablet 3  . atorvastatin (LIPITOR) 20 MG tablet Take 1 tablet (20 mg total) by mouth daily. 90 tablet 3  . Blood Glucose Monitoring Suppl (ONETOUCH VERIO) w/Device KIT 1 each by Does not apply route daily. 1 kit 0  . Dextromethorphan-Guaifenesin 20-400 MG/5ML SYRP Take 5 mLs by mouth every 4 (four) hours as needed (cough). 120 mL 0  . esomeprazole (NEXIUM) 40 MG capsule Take 1 capsule (40 mg total) by mouth  daily at 12 noon. 90 capsule 3  . flecainide (TAMBOCOR) 100 MG tablet Take 1 tablet (100 mg total) by mouth every 12 (twelve) hours. 180 tablet 3  . glucose blood (ONETOUCH VERIO) test strip Use as instructed 100 each 12  . latanoprost (XALATAN) 0.005 % ophthalmic solution Place 1 drop into both eyes at bedtime. 7.5 mL 2  . magnesium oxide (MAG-OX) 400 MG tablet Take 2 tablets (800 mg total) by mouth 2 (two) times daily. 360 tablet 3  . metFORMIN (GLUCOPHAGE) 1000 MG tablet Take 1 tablet (1,000 mg total) by mouth 2 (two) times daily with a meal. 180 tablet 3  . metoprolol tartrate (LOPRESSOR) 25 MG tablet Take 1 tablet (25 mg total) by mouth 2 (two) times daily. 180 tablet 3  . nitroGLYCERIN (NITROSTAT) 0.4 MG SL tablet Place 1 tablet (0.4 mg total) under the tongue every 5 (five) minutes as needed for chest pain. 30 tablet 11  . ONETOUCH DELICA LANCETS 37C MISC Use 1 strip daily 100 each 5  . oxyCODONE-acetaminophen (PERCOCET) 10-325 MG tablet Take 1 tablet by mouth every 6 (six) hours as needed for pain. 105 tablet 0  . sildenafil (VIAGRA) 100 MG tablet Take 1 tablet (100 mg total) by mouth daily as needed for erectile dysfunction. 5 tablet 11  . terazosin (HYTRIN) 5 MG capsule Take 1 capsule (5 mg total) by mouth at bedtime. 90 capsule 3  . torsemide (DEMADEX) 20 MG tablet TAKE 2 TABLETS BY MOUTH 3 TIMES PER WEEK 72 tablet 3  .  warfarin (COUMADIN) 5 MG tablet TAKE AS DIRECTED PER COUMADIN CLINIC (Patient taking differently: TAKE 2.5m on Mon and Fri and 589mon all other days or AS DIRECTED PER COUMADIN CLINIC) 30 tablet 2   No current facility-administered medications for this visit.     Allergies:   Ramipril and Testosterone   Social History:  The patient  reports that he has never smoked. He has never used smokeless tobacco. He reports that he drinks alcohol. He reports that he does not use drugs.   Family History:  The patient's family history includes Alzheimer's disease in his father; Early death in his brother; Heart failure in his brother and mother; Hypertension in his brother and sister; Osteoarthritis in his brother; Prostate cancer in his brother.  ROS:  Please see the history of present illness.  All other systems are reviewed and otherwise negative.   PHYSICAL EXAM:  VS:  BP 116/76   Pulse 64   Ht 6' (1.829 m)   Wt 285 lb (129.3 kg)   BMI 38.65 kg/m  BMI: Body mass index is 38.65 kg/m. Well nourished, well developed, in no acute distress  HEENT: normocephalic, atraumatic  Neck: no JVD, carotid bruits or masses Cardiac:  RRR; no significant murmurs, no rubs, or gallops Lungs:   CTA b/l, no wheezing, rhonchi or rales  Abd: soft, nontender MS: no deformity or atrophy, RUE is in splint/sling Ext:   no pitting edema is noted, LLE notable with marked scarring 2/2 reconstructive sx many years ago Skin: warm and dry, no rash Neuro:  No gross deficits appreciated Psych: euthymic mood, full affect   EKG:  Done today and reviewed by myself SR, 64bpm, PR 20039mQRS 112m46mTc 464ms17mt T changes appear similar to older EKGs   Stress myoview 11/25/14: Impression Exercise Capacity: Lexiscan with no exercise. BP Response: Normal blood pressure response. Clinical Symptoms: There is dyspnea and chest pressure ECG Impression: No significant ST segment  change suggestive of ischemia. Comparison with  Prior Nuclear Study: Compared to 05/10/12, no change. Overall Impression: Normal stress nuclear study. LV Ejection Fraction:Study not gated. . LV Wall Motion: Study not gated due to atrial fibrillation; there appears to be significant LVE.  Echo May 2016: Left ventricle: LVEF is approximately 55 to 60% The cavity size was normal. Wall thickness was increased in a pattern of mild LVH. - Pulmonary arteries: PA peak pressure: 39 mm Hg (S).  Recent Labs: 05/12/2017: ALT 19; Hemoglobin 10.5; Platelets 226 05/13/2017: BUN 25; Creatinine, Ser 1.19; Magnesium 1.6; Potassium 5.2; Sodium 135  No results found for requested labs within last 8760 hours.   CrCl cannot be calculated (Patient's most recent lab result is older than the maximum 21 days allowed.).   Wt Readings from Last 3 Encounters:  06/18/17 285 lb (129.3 kg)  05/24/17 299 lb (135.6 kg)  05/12/17 (!) 320 lb (145.2 kg)     Other studies reviewed: Additional studies/records reviewed today include: summarized above  ASSESSMENT AND PLAN:  1. Paroxysmal AFib     SR today     CHA2DS2Vasc is at least 1, on warfarin     Flecainide/Metoprolol     EKG intervals today is stable  2. Diastolic CHF, chronic SOB      Exam is euvolemic, weight is down     Re-discussed daily weights  3. HTN     Looks OK, no changes  4. CP     Not an ongoing complaint  5. Fall with trauma     ? Syncope, he believes he put out his arm to stop the fall and seems to remember his L leg giving out but had a time that he does not recall events, ?head trauma     Discussed hx of bradycardia and recommend we re-order him for a monitor though he declines for now     Prefers to monitor for any symptoms and avoid monitoring for now,  If any he will let us know   Disposition: will have him back in 3 months, sooner if needed.  Current medicines are reviewed at length with the patient today.  The patient did not have any concerns regarding  medicines.  Haywood Lasso, PA-C 06/18/2017 3:27 PM     [address] [place] [address] [phone] (office)  [phone] (fax)

## 2017-06-18 ENCOUNTER — Ambulatory Visit (INDEPENDENT_AMBULATORY_CARE_PROVIDER_SITE_OTHER): Payer: Medicare Other | Admitting: Physician Assistant

## 2017-06-18 ENCOUNTER — Ambulatory Visit (INDEPENDENT_AMBULATORY_CARE_PROVIDER_SITE_OTHER): Payer: Medicare Other | Admitting: Pharmacist

## 2017-06-18 VITALS — BP 116/76 | HR 64 | Ht 72.0 in | Wt 285.0 lb

## 2017-06-18 DIAGNOSIS — Z79899 Other long term (current) drug therapy: Secondary | ICD-10-CM

## 2017-06-18 DIAGNOSIS — I5032 Chronic diastolic (congestive) heart failure: Secondary | ICD-10-CM

## 2017-06-18 DIAGNOSIS — I10 Essential (primary) hypertension: Secondary | ICD-10-CM

## 2017-06-18 DIAGNOSIS — I25119 Atherosclerotic heart disease of native coronary artery with unspecified angina pectoris: Secondary | ICD-10-CM

## 2017-06-18 DIAGNOSIS — Z5181 Encounter for therapeutic drug level monitoring: Secondary | ICD-10-CM

## 2017-06-18 DIAGNOSIS — I480 Paroxysmal atrial fibrillation: Secondary | ICD-10-CM

## 2017-06-18 LAB — POCT INR: INR: 2

## 2017-06-18 NOTE — Patient Instructions (Signed)
Medication Instructions:   Your physician recommends that you continue on your current medications as directed. Please refer to the Current Medication list given to you today.  If you need a refill on your cardiac medications before your next appointment, please call your pharmacy.  Labwork: NONE ORDERED  TODAY    Testing/Procedures: NONE ORDERED  TODAY    Follow-Up: IN 3 MONTHS WITH RENEE URSUY   Any Other Special Instructions Will Be Listed Below (If Applicable).

## 2017-06-25 DIAGNOSIS — S42294D Other nondisplaced fracture of upper end of right humerus, subsequent encounter for fracture with routine healing: Secondary | ICD-10-CM | POA: Diagnosis not present

## 2017-06-25 DIAGNOSIS — M170 Bilateral primary osteoarthritis of knee: Secondary | ICD-10-CM | POA: Diagnosis not present

## 2017-07-01 ENCOUNTER — Other Ambulatory Visit: Payer: Self-pay | Admitting: *Deleted

## 2017-07-01 DIAGNOSIS — I4891 Unspecified atrial fibrillation: Secondary | ICD-10-CM

## 2017-07-01 MED ORDER — WARFARIN SODIUM 5 MG PO TABS: ORAL_TABLET | ORAL | 2 refills | Status: DC

## 2017-07-02 ENCOUNTER — Ambulatory Visit: Payer: Medicare Other

## 2017-07-04 ENCOUNTER — Ambulatory Visit: Payer: Medicare Other | Attending: Orthopedic Surgery | Admitting: Physical Therapy

## 2017-07-04 ENCOUNTER — Encounter: Payer: Self-pay | Admitting: Physical Therapy

## 2017-07-04 DIAGNOSIS — M25511 Pain in right shoulder: Secondary | ICD-10-CM | POA: Insufficient documentation

## 2017-07-04 DIAGNOSIS — M25621 Stiffness of right elbow, not elsewhere classified: Secondary | ICD-10-CM

## 2017-07-04 DIAGNOSIS — R600 Localized edema: Secondary | ICD-10-CM | POA: Diagnosis not present

## 2017-07-04 DIAGNOSIS — M25611 Stiffness of right shoulder, not elsewhere classified: Secondary | ICD-10-CM

## 2017-07-04 DIAGNOSIS — M6281 Muscle weakness (generalized): Secondary | ICD-10-CM | POA: Insufficient documentation

## 2017-07-05 NOTE — Therapy (Signed)
[place] [place], [place], [address] Phone: [phone]   Fax:  [phone]  Physical Therapy Evaluation  Patient Details  Name: Juan Calderon MRN: [id number] Date of Birth: July 10, 1956 Referring Provider: Dr. Oval Linsey  Encounter Date: 07/04/2017      PT End of Session - 07/04/17 1614    Visit Number 1   Number of Visits 16   Date for PT Re-Evaluation 08/29/17   PT Start Time 6160   PT Stop Time 1628   PT Time Calculation (min) 58 min   Activity Tolerance Patient tolerated treatment well   Behavior During Therapy [facility] for tasks assessed/performed      Past Medical History:  Diagnosis Date  . Alcohol abuse    . C6 radiculopathy 01/24/2016   Right upper extremity, mild to moderate electrically by EMG on 01/24/2016  . Cataract    Left eye  . Chronic diastolic heart failure ([place])     with mild left ventricular hypertrophy on Echo 02/2010  . Chronic obstructive pulmonary disease ([place])    . Chronic osteomyelitis of femur ([place]) 04/06/2016  . Chronic pain syndrome     Left arm and leg s/p traumatic injury   . Chronic renal insufficiency    . Coronary artery disease     25% LAD stenosis on cath 2007.  Stable angina.  . Diverticulosis    . Essential hypertension    . Gastroesophageal reflux disease    . Gout    . Hyperlipidemia LDL goal < 100    . Long-term current use of opiate analgesic 09/07/2016  . Mild carpal tunnel syndrome of right wrist 01/24/2016   Mild degree electrically per EMG 01/24/2016   . Morbid obesity with BMI of 40.0-44.9, adult ([place])    . Normocytic anemia    . Obstructive sleep apnea     Moderate, AHI 29.8 per hour with moderately loud snoring and oxygen desaturation to a nadir of 79%. CPAP titration resulted in a prescription for 17 CWP.    [name] Open-angle glaucoma    . Osteoarthritis cervical spine    . Osteoarthritis of left knee 06/19/2013   Tricompartmental disease.  Treated with  double hinged upright knee brace, steroid/xylocaine knee injections, and NSAIDs   . Osteoporosis 05/14/2017   s/p fracture of the right humerus from a fall at ground hight  . Paroxysmal atrial fibrillation ([place]) 10/25/2015  . Persistent atrial fibrillation ([place])    . Right rotator cuff tear     Large full-thickness tear of the supraspinatus with mild retraction but no atrophy   . Secondary male hypogonadism 02/07/2017   Likely secondary to chronic opioid use  . Subclinical hypothyroidism    . 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 [facility])      Past Surgical History:  Procedure Laterality Date  . CARDIOVERSION N/A 12/30/2014   Procedure: CARDIOVERSION;  Surgeon: [name], MD;  Location: [facility] ENDOSCOPY;  Service: Cardiovascular;  Laterality: N/A;  . FRACTURE SURGERY Left 1980's   Elbow  . Left arm surgery    . Left leg surgery    . SHOULDER SURGERY     Right    There were no vitals filed for this visit.       Subjective Assessment - 07/04/17 1536    Subjective Pt fell in Sept , LLE gave out.  He fractured Rt. arm.  He does not recall details of the incident.  He went to ED.  No surgery done.  Wore sling until October. He has difficulty using Rt. shoulder for all aspects of mobility.  He is Rt. handed.  He is not working currently.    Pertinent History CHD, CHF, A fib , CAD, C 6 radiculopathy, chronic pain, obesity, HTN, L OA in knee, diabetes    Limitations Writing;House hold activities;Lifting;Other (comment)  sleeping   Diagnostic tests Sept 05/12/17 XR done Comminuted spiral oblique fracture of the proximal right humerus.    Patient Stated Goals Pt would like to regain use of Rt. UE    Currently in Pain? Yes   Pain Score 5    Pain Location Shoulder  and Arm    Pain Orientation Right;Anterior;Proximal;Upper   Pain Type Chronic pain   Pain Onset More than a month ago   Pain Frequency Constant   Aggravating Factors  moving shoulder    Pain  Relieving Factors positioning, percocet    Effect of Pain on Daily Activities hard to use Rt UE even to feed himself           Objective measurements completed on examination: See above findings.      Table slides for AAROM UE flexion , scaption and ER Ice pack 10 min       PT Education - 07/04/17 1620    Education provided Yes   Education Details PT/POC, HEP and RICE    Person(s) Educated Patient   Methods Explanation;Handout;Demonstration;Verbal cues   Comprehension Verbalized understanding;Need further instruction;Returned demonstration          PT Short Term Goals - 07/04/17 1621      PT SHORT TERM GOAL #1   Title Pt will be I with initial HEP for Rt. UE A/AROM and strength    Time 4   Period Weeks   Status New   Target Date 08/01/17     PT SHORT TERM GOAL #2   Title Pt will be able to lift Rt. arm for eating without use of Lt UE to assist.    Time 4   Period Weeks   Status New   Target Date 08/01/17     PT SHORT TERM GOAL #3   Title Pt will be able to walk with bilateral arms extended and normalized arm swing when cued    Time 4   Period Weeks   Status New   Target Date 08/01/17           PT Long Term Goals - 07/04/17 1624      PT LONG TERM GOAL #1   Title Pt will improve FOTO score to less than 50% limited.    Time 8   Period Weeks   Status New   Target Date 08/29/17     PT LONG TERM GOAL #2   Title Pt will be able to eat with Rt UE without limitation of pain.    Time 8   Period Weeks   Status New   Target Date 08/29/17     PT LONG TERM GOAL #3   Title Pt will be able to raise Rt. arm to 120 degrees to retrieve items from cabinet.    Time 8   Period Weeks   Status New   Target Date 08/29/17     PT LONG TERM GOAL #4   Title Pt will demo Rt. UE strength to at least 3+/5 for increased ability to carry, lift items safely.    Time 8   Period Weeks   Status New  Target Date 08/29/17     PT LONG TERM GOAL #5   Title Pt will be I  with HEP as of last visit    Time 8   Period Weeks   Status New   Target Date 08/29/17                Plan - 2017/07/19 1633    Clinical Impression Statement Pt presents with acute on chronic Rt. shoulder pain after sustaining a fall on 05/12/17. This shoulder was unfortunately the one he had repaired a few yrs ago.  He had multiple medical conditions.  He has significant weakness, unable to lift Rt. arm for simple ADLs and mobility tasks. Poor posture and scapular stability.  PROM is near full in flexion but stiff in abduction, ER.  Rt. arm is swollen , felt a bit better post ice.    History and Personal Factors relevant to plan of care: multiple health issues, previous injury to Rt shoulder    Clinical Presentation Stable   Clinical Presentation due to: not changing, worsening or improving    Clinical Decision Making Low   Rehab Potential Fair   PT Frequency 2x / week   PT Duration 8 weeks   PT Treatment/Interventions ADLs/Self Care Home Management;Patient/family education;Taping;Cryotherapy;Functional mobility training;Manual techniques;Therapeutic activities;Therapeutic exercise;Ultrasound;Neuromuscular re-education;Passive range of motion   PT Next Visit Plan check HEP, UE ranger , isometrics, ice    PT Home Exercise Plan Table slides for AAROM    Consulted and Agree with Plan of Care Patient      Patient will benefit from skilled therapeutic intervention in order to improve the following deficits and impairments:  Hypomobility, Impaired sensation, Increased edema, Decreased strength, Increased fascial restricitons, Pain, Impaired UE functional use, Obesity, Decreased mobility, Decreased range of motion, Impaired flexibility, Postural dysfunction  Visit Diagnosis: Acute pain of right shoulder  Stiffness of right elbow, not elsewhere classified  Stiffness of right shoulder, not elsewhere classified  Localized edema  Muscle weakness (generalized)      G-Codes - July 19, 2017  1629    Functional Assessment Tool Used (Outpatient Only) FOTO   Functional Limitation Carrying, moving and handling objects   Carrying, Moving and Handling Objects Current Status (Z3299) At least 60 percent but less than 80 percent impaired, limited or restricted   Carrying, Moving and Handling Objects Goal Status (M4268) At least 40 percent but less than 60 percent impaired, limited or restricted       Problem List Patient Active Problem List   Diagnosis Date Noted  . Right humeral fracture 05/24/2017  . Osteoporosis 05/14/2017  . Fall 05/13/2017  . Secondary male hypogonadism 02/07/2017  . Hypomagnesemia 12/28/2016  . Long-term current use of opiate analgesic 09/07/2016  . Mild carpal tunnel syndrome of right wrist 01/24/2016  . C6 radiculopathy 01/24/2016  . Paroxysmal atrial fibrillation ([place]) 10/25/2015  . Alcohol abuse 02/28/2015  . Constipation due to opioid therapy 12/24/2014  . Venous stasis ulcer of left lower extremity ([place]) 08/06/2014  . Encounter for therapeutic drug monitoring 11/16/2013  . Diverticulosis 11/12/2013  . Post-traumatic osteoarthritis of left knee 06/19/2013  . Obstructive sleep apnea 06/01/2013  . Osteoarthritis cervical spine 04/25/2013  . Gastroesophageal reflux disease without esophagitis 04/25/2013  . Vasomotor rhinitis 04/25/2013  . Open-angle glaucoma 04/25/2013  . Hyperlipidemia 04/25/2013  . 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 ([place]) 04/25/2013  . Coronary artery disease involving native coronary artery with angina pectoris ([place]) 04/25/2013  . Chronic asthmatic bronchitis ([place])  04/25/2013  . Idiopathic chronic gout without tophus 04/25/2013  . Severe obesity with body mass index (BMI) of 35.0 to 39.9 with comorbidity ([place]) 04/25/2013  . Right rotator cuff tear 04/25/2013  . Healthcare maintenance 01/15/2013  . Chronic pain syndrome 01/15/2013  . Chronic diastolic heart failure ([place]) 02/04/2012  .  Essential hypertension 09/20/2011    Zariya Minner 07/05/2017, 8:01 AM  [facility] [address] [place], [place], [address] Phone: [phone]   Fax:  [phone]  Name: RALSTON VENUS MRN: [id number] Date of Birth: Jan 28, 1956   Raeford Razor, PT 07/05/17 8:07 AM Phone: [phone] Fax: [phone]

## 2017-07-09 ENCOUNTER — Ambulatory Visit: Payer: Medicare Other | Admitting: Physical Therapy

## 2017-07-09 DIAGNOSIS — M25511 Pain in right shoulder: Secondary | ICD-10-CM

## 2017-07-09 DIAGNOSIS — M25621 Stiffness of right elbow, not elsewhere classified: Secondary | ICD-10-CM | POA: Diagnosis not present

## 2017-07-09 DIAGNOSIS — M6281 Muscle weakness (generalized): Secondary | ICD-10-CM | POA: Diagnosis not present

## 2017-07-09 DIAGNOSIS — M25611 Stiffness of right shoulder, not elsewhere classified: Secondary | ICD-10-CM

## 2017-07-09 DIAGNOSIS — R600 Localized edema: Secondary | ICD-10-CM

## 2017-07-09 NOTE — Therapy (Signed)
[place] [employer], [place], [address] Phone: [phone]   Fax:  [phone]  Physical Therapy Treatment  Patient Details  Name: Juan Calderon MRN: [id number] Date of Birth: 07-03-1956 Referring Provider: Dr. Oval Linsey   Encounter Date: 07/09/2017  PT End of Session - 07/09/17 1627    Visit Number  2    Number of Visits  16    Date for PT Re-Evaluation  08/29/17    PT Start Time  9323    PT Stop Time  1635    PT Time Calculation (min)  45 min    Activity Tolerance  Patient tolerated treatment well    Behavior During Therapy  [facility] for tasks assessed/performed;Flat affect       Past Medical History:  Diagnosis Date  . Alcohol abuse    . C6 radiculopathy 01/24/2016   Right upper extremity, mild to moderate electrically by EMG on 01/24/2016  . Cataract    Left eye  . Chronic diastolic heart failure ([place])     with mild left ventricular hypertrophy on Echo 02/2010  . Chronic obstructive pulmonary disease ([place])    . Chronic osteomyelitis of femur ([place]) 04/06/2016  . Chronic pain syndrome     Left arm and leg s/p traumatic injury   . Chronic renal insufficiency    . Coronary artery disease     25% LAD stenosis on cath 2007.  Stable angina.  . Diverticulosis    . Essential hypertension    . Gastroesophageal reflux disease    . Gout    . Hyperlipidemia LDL goal < 100    . Long-term current use of opiate analgesic 09/07/2016  . Mild carpal tunnel syndrome of right wrist 01/24/2016   Mild degree electrically per EMG 01/24/2016   . Morbid obesity with BMI of 40.0-44.9, adult ([place])    . Normocytic anemia    . Obstructive sleep apnea     Moderate, AHI 29.8 per hour with moderately loud snoring and oxygen desaturation to a nadir of 79%. CPAP titration resulted in a prescription for 17 CWP.    [name] Open-angle glaucoma    . Osteoarthritis cervical spine    . Osteoarthritis of left knee 06/19/2013   Tricompartmental  disease.  Treated with double hinged upright knee brace, steroid/xylocaine knee injections, and NSAIDs   . Osteoporosis 05/14/2017   s/p fracture of the right humerus from a fall at ground hight  . Paroxysmal atrial fibrillation ([place]) 10/25/2015  . Persistent atrial fibrillation ([place])    . Right rotator cuff tear     Large full-thickness tear of the supraspinatus with mild retraction but no atrophy   . Secondary male hypogonadism 02/07/2017   Likely secondary to chronic opioid use  . Subclinical hypothyroidism    . 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 [facility])      Past Surgical History:  Procedure Laterality Date  . FRACTURE SURGERY Left 1980's   Elbow  . Left arm surgery    . Left leg surgery    . SHOULDER SURGERY     Right    There were no vitals filed for this visit.  Subjective Assessment - 07/09/17 1558    Subjective  Pain is not bad.  3/10. Has been doing his exercises.     Currently in Pain?  Yes    Pain Score  3     Pain Location  Shoulder    Pain Orientation  Right  Pain Descriptors / Indicators  Aching    Pain Type  Chronic pain    Pain Onset  More than a month ago    Pain Frequency  Constant         [facility] Adult PT Treatment/Exercise - 07/09/17 0001      Shoulder Exercises: Supine   Other Supine Exercises  wand exercises wiht UE Ranger x 10 : chest press, overhead, cross body and ER/IR.       Shoulder Exercises: Seated   Retraction  Strengthening;Both;10 reps    External Rotation  AAROM;Right;10 reps    Flexion  AAROM;Right;10 reps    Abduction  AAROM;Right;10 reps      Shoulder Exercises: Isometric Strengthening   Flexion  5X10"    Extension  5X10"    External Rotation  5X10"    Internal Rotation  5X10"    ABduction  5X10"      Cryotherapy   Number Minutes Cryotherapy  5 Minutes    Cryotherapy Location  Shoulder    Type of Cryotherapy  Ice pack      Manual Therapy   Manual Therapy  Joint mobilization;Soft tissue  mobilization;Myofascial release;Passive ROM    Joint Mobilization  inf glides Gr II seated arm propped     Soft tissue mobilization  Rt. UE deltoid, pec minor     Myofascial Release  Rt. UE lateral and anterior     Passive ROM  all planes to tolerance              PT Education - 07/09/17 2033    Education provided  Yes    Education Details  Isometrics, ice /swelling     Person(s) Educated  Patient    Methods  Explanation;Handout;Verbal cues;Tactile cues;Demonstration    Comprehension  Verbalized understanding;Verbal cues required;Need further instruction       PT Short Term Goals - 07/09/17 1600      PT SHORT TERM GOAL #1   Title  Pt will be I with initial HEP for Rt. UE A/AROM and strength     Status  On-going      PT SHORT TERM GOAL #2   Title  Pt will be able to lift Rt. arm for eating without use of Lt UE to assist.     Status  On-going      PT SHORT TERM GOAL #3   Title  Pt will be able to walk with bilateral arms extended and normalized arm swing when cued     Status  On-going        PT Long Term Goals - 07/04/17 1624      PT LONG TERM GOAL #1   Title  Pt will improve FOTO score to less than 50% limited.     Time  8    Period  Weeks    Status  New    Target Date  08/29/17      PT LONG TERM GOAL #2   Title  Pt will be able to eat with Rt UE without limitation of pain.     Time  8    Period  Weeks    Status  New    Target Date  08/29/17      PT LONG TERM GOAL #3   Title  Pt will be able to raise Rt. arm to 120 degrees to retrieve items from cabinet.     Time  8    Period  Weeks    Status  New  Target Date  08/29/17      PT LONG TERM GOAL #4   Title  Pt will demo Rt. UE strength to at least 3+/5 for increased ability to carry, lift items safely.     Time  8    Period  Weeks    Status  New    Target Date  08/29/17      PT LONG TERM GOAL #5   Title  Pt will be I with HEP as of last visit     Time  8    Period  Weeks    Status  New     Target Date  08/29/17            Plan - 07/09/17 1628    Clinical Impression Statement  Pt's first visit today, worked on softening Rt. proximal shoulder tissue with slght supported abduction.  Knows HEP, added in isometrics for home strengthening.  Pain levels controlled <5/10 with exercises.      PT Next Visit Plan  Progress ROm and strength, UE ranger , isometrics, ice , manual     PT Home Exercise Plan  Table slides for AAROM , isometrics     Consulted and Agree with Plan of Care  Patient       Patient will benefit from skilled therapeutic intervention in order to improve the following deficits and impairments:  Hypomobility, Impaired sensation, Increased edema, Decreased strength, Increased fascial restricitons, Pain, Impaired UE functional use, Obesity, Decreased mobility, Decreased range of motion, Impaired flexibility, Postural dysfunction  Visit Diagnosis: Acute pain of right shoulder  Stiffness of right elbow, not elsewhere classified  Stiffness of right shoulder, not elsewhere classified  Localized edema  Muscle weakness (generalized)     Problem List Patient Active Problem List   Diagnosis Date Noted  . Right humeral fracture 05/24/2017  . Osteoporosis 05/14/2017  . Fall 05/13/2017  . Secondary male hypogonadism 02/07/2017  . Hypomagnesemia 12/28/2016  . Long-term current use of opiate analgesic 09/07/2016  . Mild carpal tunnel syndrome of right wrist 01/24/2016  . C6 radiculopathy 01/24/2016  . Paroxysmal atrial fibrillation ([place]) 10/25/2015  . Alcohol abuse 02/28/2015  . Constipation due to opioid therapy 12/24/2014  . Venous stasis ulcer of left lower extremity ([place]) 08/06/2014  . Encounter for therapeutic drug monitoring 11/16/2013  . Diverticulosis 11/12/2013  . Post-traumatic osteoarthritis of left knee 06/19/2013  . Obstructive sleep apnea 06/01/2013  . Osteoarthritis cervical spine 04/25/2013  . Gastroesophageal reflux disease without  esophagitis 04/25/2013  . Vasomotor rhinitis 04/25/2013  . Open-angle glaucoma 04/25/2013  . Hyperlipidemia 04/25/2013  . 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 ([place]) 04/25/2013  . Coronary artery disease involving native coronary artery with angina pectoris ([place]) 04/25/2013  . Chronic asthmatic bronchitis ([place]) 04/25/2013  . Idiopathic chronic gout without tophus 04/25/2013  . Severe obesity with body mass index (BMI) of 35.0 to 39.9 with comorbidity ([place]) 04/25/2013  . Right rotator cuff tear 04/25/2013  . Healthcare maintenance 01/15/2013  . Chronic pain syndrome 01/15/2013  . Chronic diastolic heart failure ([place]) 02/04/2012  . Essential hypertension 09/20/2011    Maccoy Haubner 07/09/2017, 8:36 PM  [facility] [address] [place], [place], [address] Phone: [phone]   Fax:  [phone]  Name: DIO GILLER MRN: [id number] Date of Birth: 06-Dec-1955  Raeford Razor, PT 07/09/17 8:36 PM Phone: [phone] Fax: [phone]

## 2017-07-09 NOTE — Patient Instructions (Signed)
Strengthening: Isometric Flexion  Using wall for resistance, press right fist into ball using light pressure. Hold ____ seconds. Repeat ____ times per set. Do ____ sets per session. Do ____ sessions per day.  SHOULDER: Abduction (Isometric)  Use wall as resistance. Press arm against pillow. Keep elbow straight. Hold ___ seconds. ___ reps per set, ___ sets per day, ___ days per week  Extension (Isometric)  Place left bent elbow and back of arm against wall. Press elbow against wall. Hold ____ seconds. Repeat ____ times. Do ____ sessions per day.  Internal Rotation (Isometric)  Place palm of right fist against door frame, with elbow bent. Press fist against door frame. Hold ____ seconds. Repeat ____ times. Do ____ sessions per day.  External Rotation (Isometric)  Place back of left fist against door frame, with elbow bent. Press fist against door frame. Hold ____ seconds. Repeat ____ times. Do ____ sessions per day.  Copyright  VHI. All rights reserved.    

## 2017-07-11 ENCOUNTER — Ambulatory Visit: Payer: Medicare Other

## 2017-07-11 DIAGNOSIS — M6281 Muscle weakness (generalized): Secondary | ICD-10-CM

## 2017-07-11 DIAGNOSIS — M25611 Stiffness of right shoulder, not elsewhere classified: Secondary | ICD-10-CM

## 2017-07-11 DIAGNOSIS — R600 Localized edema: Secondary | ICD-10-CM

## 2017-07-11 DIAGNOSIS — M25511 Pain in right shoulder: Secondary | ICD-10-CM

## 2017-07-11 DIAGNOSIS — M25621 Stiffness of right elbow, not elsewhere classified: Secondary | ICD-10-CM

## 2017-07-11 NOTE — Therapy (Signed)
[place], [place], [address] Phone: [phone]   Fax:  [phone]  Physical Therapy Treatment  Patient Details  Name: Juan Calderon MRN: [id number] Date of Birth: 11/10/1955 Referring Provider: Dr. Oval Linsey   Encounter Date: 07/11/2017  PT End of Session - 07/11/17 1413    Visit Number  3    Number of Visits  16    Date for PT Re-Evaluation  08/29/17    PT Start Time  0215    PT Stop Time  0253    PT Time Calculation (min)  38 min    Activity Tolerance  Patient tolerated treatment well    Behavior During Therapy  [facility] for tasks assessed/performed;Flat affect       Past Medical History:  Diagnosis Date  . Alcohol abuse    . C6 radiculopathy 01/24/2016   Right upper extremity, mild to moderate electrically by EMG on 01/24/2016  . Cataract    Left eye  . Chronic diastolic heart failure ([place])     with mild left ventricular hypertrophy on Echo 02/2010  . Chronic obstructive pulmonary disease ([place])    . Chronic osteomyelitis of femur ([place]) 04/06/2016  . Chronic pain syndrome     Left arm and leg s/p traumatic injury   . Chronic renal insufficiency    . Coronary artery disease     25% LAD stenosis on cath 2007.  Stable angina.  . Diverticulosis    . Essential hypertension    . Gastroesophageal reflux disease    . Gout    . Hyperlipidemia LDL goal < 100    . Long-term current use of opiate analgesic 09/07/2016  . Mild carpal tunnel syndrome of right wrist 01/24/2016   Mild degree electrically per EMG 01/24/2016   . Morbid obesity with BMI of 40.0-44.9, adult ([place])    . Normocytic anemia    . Obstructive sleep apnea     Moderate, AHI 29.8 per hour with moderately loud snoring and oxygen desaturation to a nadir of 79%. CPAP titration resulted in a prescription for 17 CWP.    [name] Open-angle glaucoma    . Osteoarthritis cervical spine    . Osteoarthritis of left knee 06/19/2013   Tricompartmental  disease.  Treated with double hinged upright knee brace, steroid/xylocaine knee injections, and NSAIDs   . Osteoporosis 05/14/2017   s/p fracture of the right humerus from a fall at ground hight  . Paroxysmal atrial fibrillation ([place]) 10/25/2015  . Persistent atrial fibrillation ([place])    . Right rotator cuff tear     Large full-thickness tear of the supraspinatus with mild retraction but no atrophy   . Secondary male hypogonadism 02/07/2017   Likely secondary to chronic opioid use  . Subclinical hypothyroidism    . 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 [facility])      Past Surgical History:  Procedure Laterality Date  . FRACTURE SURGERY Left 1980's   Elbow  . Left arm surgery    . Left leg surgery    . SHOULDER SURGERY     Right    There were no vitals filed for this visit.  Subjective Assessment - 07/11/17 1425    Subjective  Doing Ok . PAin about same 3/10    Pain Score  3     Pain Location  Shoulder    Pain Orientation  Right    Pain Descriptors / Indicators  Aching    Pain  Type  Chronic pain    Pain Onset  More than a month ago    Pain Frequency  Constant    Aggravating Factors   sing arm    Pain Relieving Factors  rest ,meds    Multiple Pain Sites  No                      [facility] Adult PT Treatment/Exercise - 07/11/17 0001      Shoulder Exercises: Supine   Horizontal ABduction  Right;AROM;15 reps    Other Supine Exercises  stablizatipon circles clock and counter clockwise x 15      Shoulder Exercises: Seated   Other Seated Exercises  bicep curls 3 pounds x 20      Shoulder Exercises: Prone   Extension  Right;20 reps    Horizontal ABduction 1  Right;20 reps;AAROM      Shoulder Exercises: Standing   Row  Right;20 reps    Other Standing Exercises  UE ranger reaching overheadd x 15      Manual Therapy   Joint Mobilization  inf glides Gr IIl seated arm propped     Soft tissue mobilization  acilla posterior    Passive ROM   all planes to tolerance       active ROM all planes x 12-15 reps supine          PT Short Term Goals - 07/09/17 1600      PT SHORT TERM GOAL #1   Title  Pt will be I with initial HEP for Rt. UE A/AROM and strength     Status  On-going      PT SHORT TERM GOAL #2   Title  Pt will be able to lift Rt. arm for eating without use of Lt UE to assist.     Status  On-going      PT SHORT TERM GOAL #3   Title  Pt will be able to walk with bilateral arms extended and normalized arm swing when cued     Status  On-going        PT Long Term Goals - 07/04/17 1624      PT LONG TERM GOAL #1   Title  Pt will improve FOTO score to less than 50% limited.     Time  8    Period  Weeks    Status  New    Target Date  08/29/17      PT LONG TERM GOAL #2   Title  Pt will be able to eat with Rt UE without limitation of pain.     Time  8    Period  Weeks    Status  New    Target Date  08/29/17      PT LONG TERM GOAL #3   Title  Pt will be able to raise Rt. arm to 120 degrees to retrieve items from cabinet.     Time  8    Period  Weeks    Status  New    Target Date  08/29/17      PT LONG TERM GOAL #4   Title  Pt will demo Rt. UE strength to at least 3+/5 for increased ability to carry, lift items safely.     Time  8    Period  Weeks    Status  New    Target Date  08/29/17      PT LONG TERM GOAL #5   Title  Pt will be I with HEP as of last visit     Time  8    Period  Weeks    Status  New    Target Date  08/29/17            Plan - 07/11/17 1413    Clinical Impression Statement  He appears to be progreessing well with good active range weakness still issue and hor abduction prone he was not abel to lift full ROM without assist. No increased pain    PT Treatment/Interventions  ADLs/Self Care Home Management;Patient/family education;Taping;Cryotherapy;Functional mobility training;Manual techniques;Therapeutic activities;Therapeutic exercise;Ultrasound;Neuromuscular  re-education;Passive range of motion    PT Next Visit Plan  Progress ROm and strength, UE ranger , isometrics, ice , manual     PT Home Exercise Plan  Table slides for AAROM , isometrics     Consulted and Agree with Plan of Care  Patient       Patient will benefit from skilled therapeutic intervention in order to improve the following deficits and impairments:  Hypomobility, Impaired sensation, Increased edema, Decreased strength, Increased fascial restricitons, Pain, Impaired UE functional use, Obesity, Decreased mobility, Decreased range of motion, Impaired flexibility, Postural dysfunction  Visit Diagnosis: Acute pain of right shoulder  Stiffness of right elbow, not elsewhere classified  Stiffness of right shoulder, not elsewhere classified  Localized edema  Muscle weakness (generalized)     Problem List Patient Active Problem List   Diagnosis Date Noted  . Right humeral fracture 05/24/2017  . Osteoporosis 05/14/2017  . Fall 05/13/2017  . Secondary male hypogonadism 02/07/2017  . Hypomagnesemia 12/28/2016  . Long-term current use of opiate analgesic 09/07/2016  . Mild carpal tunnel syndrome of right wrist 01/24/2016  . C6 radiculopathy 01/24/2016  . Paroxysmal atrial fibrillation ([place]) 10/25/2015  . Alcohol abuse 02/28/2015  . Constipation due to opioid therapy 12/24/2014  . Venous stasis ulcer of left lower extremity ([place]) 08/06/2014  . Encounter for therapeutic drug monitoring 11/16/2013  . Diverticulosis 11/12/2013  . Post-traumatic osteoarthritis of left knee 06/19/2013  . Obstructive sleep apnea 06/01/2013  . Osteoarthritis cervical spine 04/25/2013  . Gastroesophageal reflux disease without esophagitis 04/25/2013  . Vasomotor rhinitis 04/25/2013  . Open-angle glaucoma 04/25/2013  . Hyperlipidemia 04/25/2013  . 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 ([place]) 04/25/2013  . Coronary artery disease involving native coronary artery with  angina pectoris ([place]) 04/25/2013  . Chronic asthmatic bronchitis ([place]) 04/25/2013  . Idiopathic chronic gout without tophus 04/25/2013  . Severe obesity with body mass index (BMI) of 35.0 to 39.9 with comorbidity ([place]) 04/25/2013  . Right rotator cuff tear 04/25/2013  . Healthcare maintenance 01/15/2013  . Chronic pain syndrome 01/15/2013  . Chronic diastolic heart failure ([place]) 02/04/2012  . Essential hypertension 09/20/2011    Darrel Hoover  PT 07/11/2017, 3:05 PM  [facility] [address] [place], [place], [address] Phone: [phone]   Fax:  [phone]  Name: Juan Calderon MRN: [id number] Date of Birth: 10-04-1955

## 2017-07-16 ENCOUNTER — Ambulatory Visit: Payer: Medicare Other | Admitting: Physical Therapy

## 2017-07-16 ENCOUNTER — Ambulatory Visit (INDEPENDENT_AMBULATORY_CARE_PROVIDER_SITE_OTHER): Payer: Medicare Other | Admitting: *Deleted

## 2017-07-16 DIAGNOSIS — M6281 Muscle weakness (generalized): Secondary | ICD-10-CM

## 2017-07-16 DIAGNOSIS — M25611 Stiffness of right shoulder, not elsewhere classified: Secondary | ICD-10-CM

## 2017-07-16 DIAGNOSIS — R600 Localized edema: Secondary | ICD-10-CM | POA: Diagnosis not present

## 2017-07-16 DIAGNOSIS — I4891 Unspecified atrial fibrillation: Secondary | ICD-10-CM | POA: Diagnosis not present

## 2017-07-16 DIAGNOSIS — M25511 Pain in right shoulder: Secondary | ICD-10-CM

## 2017-07-16 DIAGNOSIS — M25621 Stiffness of right elbow, not elsewhere classified: Secondary | ICD-10-CM

## 2017-07-16 DIAGNOSIS — Z5181 Encounter for therapeutic drug level monitoring: Secondary | ICD-10-CM | POA: Diagnosis not present

## 2017-07-16 LAB — POCT INR: INR: 3

## 2017-07-16 NOTE — Therapy (Signed)
[place], [place], [address] Phone: [phone]   Fax:  [phone]  Physical Therapy Treatment  Patient Details  Name: Juan Calderon MRN: [id number] Date of Birth: 12-10-1955 Referring Provider: Dr. Oval Linsey   Encounter Date: 07/16/2017  PT End of Session - 07/16/17 1606    Visit Number  4    Number of Visits  16    Date for PT Re-Evaluation  08/29/17    PT Start Time  6144    PT Stop Time  1615    PT Time Calculation (min)  45 min    Activity Tolerance  Patient tolerated treatment well    Behavior During Therapy  [facility] for tasks assessed/performed;Flat affect       Past Medical History:  Diagnosis Date  . Alcohol abuse    . C6 radiculopathy 01/24/2016   Right upper extremity, mild to moderate electrically by EMG on 01/24/2016  . Cataract    Left eye  . Chronic diastolic heart failure ([place])     with mild left ventricular hypertrophy on Echo 02/2010  . Chronic obstructive pulmonary disease ([place])    . Chronic osteomyelitis of femur ([place]) 04/06/2016  . Chronic pain syndrome     Left arm and leg s/p traumatic injury   . Chronic renal insufficiency    . Coronary artery disease     25% LAD stenosis on cath 2007.  Stable angina.  . Diverticulosis    . Essential hypertension    . Gastroesophageal reflux disease    . Gout    . Hyperlipidemia LDL goal < 100    . Long-term current use of opiate analgesic 09/07/2016  . Mild carpal tunnel syndrome of right wrist 01/24/2016   Mild degree electrically per EMG 01/24/2016   . Morbid obesity with BMI of 40.0-44.9, adult ([place])    . Normocytic anemia    . Obstructive sleep apnea     Moderate, AHI 29.8 per hour with moderately loud snoring and oxygen desaturation to a nadir of 79%. CPAP titration resulted in a prescription for 17 CWP.    [name] Open-angle glaucoma    . Osteoarthritis cervical spine    . Osteoarthritis of left knee 06/19/2013   Tricompartmental  disease.  Treated with double hinged upright knee brace, steroid/xylocaine knee injections, and NSAIDs   . Osteoporosis 05/14/2017   s/p fracture of the right humerus from a fall at ground hight  . Paroxysmal atrial fibrillation ([place]) 10/25/2015  . Persistent atrial fibrillation ([place])    . Right rotator cuff tear     Large full-thickness tear of the supraspinatus with mild retraction but no atrophy   . Secondary male hypogonadism 02/07/2017   Likely secondary to chronic opioid use  . Subclinical hypothyroidism    . 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 [facility])      Past Surgical History:  Procedure Laterality Date  . FRACTURE SURGERY Left 1980's   Elbow  . Left arm surgery    . Left leg surgery    . SHOULDER SURGERY     Right    There were no vitals filed for this visit.       [place] Adult PT Treatment/Exercise - 07/16/17 0001      Shoulder Exercises: Sidelying   External Rotation  AAROM;Strengthening;Right;10 reps    Flexion  AAROM;Strengthening;Right;10 reps    ABduction  AAROM;Strengthening;Right;10 reps    Other Sidelying Exercises  sleeper stretch gentle x10 ,  5 sec       Shoulder Exercises: Standing   Horizontal ABduction  AAROM;Strengthening;Right;15 reps    Flexion  AAROM;Strengthening;Right;20 reps    ABduction  AAROM;Strengthening;Right;15 reps    Other Standing Exercises  above done with UE ranger       Shoulder Exercises: Pulleys   Flexion  3 minutes      Shoulder Exercises: ROM/Strengthening   UBE (Upper Arm Bike)  5 min level 1 , forward for 2 min and back for 3 min     Wall Pushups  10 reps    Pendulum  2 min end of session to relax mm       Shoulder Exercises: Isometric Strengthening   Flexion  5X5"    Extension  5X5"    External Rotation  5X5"    Internal Rotation  5X5"    ABduction  5X5"      Manual Therapy   Manual therapy comments  joint oscillation, distraction     Joint Mobilization  inf glides Gr IIl seated  arm propped     Passive ROM  all planes to tolerance                PT Short Term Goals - 07/16/17 1615      PT SHORT TERM GOAL #1   Title  Pt will be I with initial HEP for Rt. UE A/AROM and strength     Status  Achieved      PT SHORT TERM GOAL #2   Title  Pt will be able to lift Rt. arm for eating without use of Lt UE to assist.       PT SHORT TERM GOAL #3   Title  Pt will be able to walk with bilateral arms extended and normalized arm swing when cued     Status  Unable to assess        PT Long Term Goals - 07/04/17 1624      PT LONG TERM GOAL #1   Title  Pt will improve FOTO score to less than 50% limited.     Time  8    Period  Weeks    Status  New    Target Date  08/29/17      PT LONG TERM GOAL #2   Title  Pt will be able to eat with Rt UE without limitation of pain.     Time  8    Period  Weeks    Status  New    Target Date  08/29/17      PT LONG TERM GOAL #3   Title  Pt will be able to raise Rt. arm to 120 degrees to retrieve items from cabinet.     Time  8    Period  Weeks    Status  New    Target Date  08/29/17      PT LONG TERM GOAL #4   Title  Pt will demo Rt. UE strength to at least 3+/5 for increased ability to carry, lift items safely.     Time  8    Period  Weeks    Status  New    Target Date  08/29/17      PT LONG TERM GOAL #5   Title  Pt will be I with HEP as of last visit     Time  8    Period  Weeks    Status  New    Target Date  08/29/17            Plan - 07/16/17 1538    Clinical Impression Statement  Patient tolerates exercises well, is I with initial HEP.  Lacks strength in all planes and with scapular compensation as well.  No pain increase reported.      PT Next Visit Plan  Progress ROm and strength, UE ranger , isometrics, ice , manual     PT Home Exercise Plan  Table slides for AAROM , isometrics     Consulted and Agree with Plan of Care  Patient       Patient will benefit from skilled therapeutic intervention  in order to improve the following deficits and impairments:  Hypomobility, Impaired sensation, Increased edema, Decreased strength, Increased fascial restricitons, Pain, Impaired UE functional use, Obesity, Decreased mobility, Decreased range of motion, Impaired flexibility, Postural dysfunction  Visit Diagnosis: Acute pain of right shoulder  Stiffness of right elbow, not elsewhere classified  Stiffness of right shoulder, not elsewhere classified  Localized edema  Muscle weakness (generalized)     Problem List Patient Active Problem List   Diagnosis Date Noted  . Right humeral fracture 05/24/2017  . Osteoporosis 05/14/2017  . Fall 05/13/2017  . Secondary male hypogonadism 02/07/2017  . Hypomagnesemia 12/28/2016  . Long-term current use of opiate analgesic 09/07/2016  . Mild carpal tunnel syndrome of right wrist 01/24/2016  . C6 radiculopathy 01/24/2016  . Paroxysmal atrial fibrillation ([place]) 10/25/2015  . Alcohol abuse 02/28/2015  . Constipation due to opioid therapy 12/24/2014  . Venous stasis ulcer of left lower extremity ([place]) 08/06/2014  . Encounter for therapeutic drug monitoring 11/16/2013  . Diverticulosis 11/12/2013  . Post-traumatic osteoarthritis of left knee 06/19/2013  . Obstructive sleep apnea 06/01/2013  . Osteoarthritis cervical spine 04/25/2013  . Gastroesophageal reflux disease without esophagitis 04/25/2013  . Vasomotor rhinitis 04/25/2013  . Open-angle glaucoma 04/25/2013  . Hyperlipidemia 04/25/2013  . 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 ([place]) 04/25/2013  . Coronary artery disease involving native coronary artery with angina pectoris ([place]) 04/25/2013  . Chronic asthmatic bronchitis ([place]) 04/25/2013  . Idiopathic chronic gout without tophus 04/25/2013  . Severe obesity with body mass index (BMI) of 35.0 to 39.9 with comorbidity ([place]) 04/25/2013  . Right rotator cuff tear 04/25/2013  . Healthcare maintenance 01/15/2013   . Chronic pain syndrome 01/15/2013  . Chronic diastolic heart failure ([place]) 02/04/2012  . Essential hypertension 09/20/2011    PAA,JENNIFER 07/16/2017, 4:15 PM  [facility] [address] [place], [place], [address] Phone: [phone]   Fax:  [phone]  Name: Juan Calderon MRN: [id number] Date of Birth: 09-13-1955  Raeford Razor, PT 07/16/17 4:15 PM Phone: [phone] Fax: [phone]

## 2017-07-16 NOTE — Patient Instructions (Signed)
Continue taking 1 tablet everyday except 1/2 tablet on Mondays and Fridays. Eat a serving of greens today & remain consistent. Recheck INR in 5 weeks. Coumadin clinic [phone] with concerns.

## 2017-07-18 ENCOUNTER — Ambulatory Visit: Payer: Medicare Other | Admitting: Physical Therapy

## 2017-07-18 DIAGNOSIS — M25621 Stiffness of right elbow, not elsewhere classified: Secondary | ICD-10-CM | POA: Diagnosis not present

## 2017-07-18 DIAGNOSIS — R600 Localized edema: Secondary | ICD-10-CM

## 2017-07-18 DIAGNOSIS — M25511 Pain in right shoulder: Secondary | ICD-10-CM | POA: Diagnosis not present

## 2017-07-18 DIAGNOSIS — M6281 Muscle weakness (generalized): Secondary | ICD-10-CM | POA: Diagnosis not present

## 2017-07-18 DIAGNOSIS — M25611 Stiffness of right shoulder, not elsewhere classified: Secondary | ICD-10-CM | POA: Diagnosis not present

## 2017-07-18 NOTE — Therapy (Signed)
[place], [place], [address] Phone: [phone]   Fax:  [phone]  Physical Therapy Treatment  Patient Details  Name: Juan Calderon MRN: [id number] Date of Birth: March 19, 1956 Referring Provider: Dr. Oval Linsey   Encounter Date: 07/18/2017  PT End of Session - 07/18/17 1453    Visit Number  5    Number of Visits  16    Date for PT Re-Evaluation  08/29/17    PT Start Time  1455    PT Stop Time  1541    PT Time Calculation (min)  46 min    Activity Tolerance  Patient tolerated treatment well    Behavior During Therapy  [facility] for tasks assessed/performed;Flat affect       Past Medical History:  Diagnosis Date  . Alcohol abuse    . C6 radiculopathy 01/24/2016   Right upper extremity, mild to moderate electrically by EMG on 01/24/2016  . Cataract    Left eye  . Chronic diastolic heart failure ([place])     with mild left ventricular hypertrophy on Echo 02/2010  . Chronic obstructive pulmonary disease ([place])    . Chronic osteomyelitis of femur ([place]) 04/06/2016  . Chronic pain syndrome     Left arm and leg s/p traumatic injury   . Chronic renal insufficiency    . Coronary artery disease     25% LAD stenosis on cath 2007.  Stable angina.  . Diverticulosis    . Essential hypertension    . Gastroesophageal reflux disease    . Gout    . Hyperlipidemia LDL goal < 100    . Long-term current use of opiate analgesic 09/07/2016  . Mild carpal tunnel syndrome of right wrist 01/24/2016   Mild degree electrically per EMG 01/24/2016   . Morbid obesity with BMI of 40.0-44.9, adult ([place])    . Normocytic anemia    . Obstructive sleep apnea     Moderate, AHI 29.8 per hour with moderately loud snoring and oxygen desaturation to a nadir of 79%. CPAP titration resulted in a prescription for 17 CWP.    [name] Open-angle glaucoma    . Osteoarthritis cervical spine    . Osteoarthritis of left knee 06/19/2013   Tricompartmental  disease.  Treated with double hinged upright knee brace, steroid/xylocaine knee injections, and NSAIDs   . Osteoporosis 05/14/2017   s/p fracture of the right humerus from a fall at ground hight  . Paroxysmal atrial fibrillation ([place]) 10/25/2015  . Persistent atrial fibrillation ([place])    . Right rotator cuff tear     Large full-thickness tear of the supraspinatus with mild retraction but no atrophy   . Secondary male hypogonadism 02/07/2017   Likely secondary to chronic opioid use  . Subclinical hypothyroidism    . 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 [facility])      Past Surgical History:  Procedure Laterality Date  . CARDIOVERSION N/A 12/30/2014   Procedure: CARDIOVERSION;  Surgeon: [name], MD;  Location: [facility] ENDOSCOPY;  Service: Cardiovascular;  Laterality: N/A;  . FRACTURE SURGERY Left 1980's   Elbow  . Left arm surgery    . Left leg surgery    . SHOULDER SURGERY     Right    There were no vitals filed for this visit.  Subjective Assessment - 07/18/17 1457    Subjective  Still about a 3/10.      Currently in Pain?  Yes  Pain Score  3     Pain Orientation  Right    Pain Descriptors / Indicators  Aching    Pain Type  Chronic pain    Pain Onset  More than a month ago    Pain Frequency  Constant         [facility] PT Assessment - 07/18/17 0001      AROM   Right Shoulder Extension  35 Degrees    Right Shoulder Flexion  74 Degrees    Right Shoulder ABduction  63 Degrees    Right Shoulder Internal Rotation  -- FR to Rt. mid lumbar     Right Shoulder External Rotation  32 Degrees      Strength   Right Shoulder Flexion  3-/5    Right Shoulder ABduction  2+/5            [facility] Adult PT Treatment/Exercise - 07/18/17 0001      Shoulder Exercises: Supine   Flexion  AAROM;Strengthening;Right;10 reps    Other Supine Exercises  stabilization circles clock and counter clockwise x 15 2 lbs       Shoulder Exercises: Seated   Other Seated  Exercises  bicep curls 5 pounds x 20      Shoulder Exercises: Prone   Extension  Right;20 reps    Other Prone Exercises  row x 20 3 lbs and triceps 3 lbs       Shoulder Exercises: Standing   External Rotation  Strengthening;Right;20 reps    Flexion  Strengthening;Right;10 reps yellow    ABduction  Strengthening;Right;10 reps yellow     Row  Strengthening;Both;20 reps;Other (comment) green  2 sets 1 waist high and one high row     Other Standing Exercises  adduction Rt. UE green x 20       Shoulder Exercises: Pulleys   Flexion  3 minutes      Cryotherapy   Number Minutes Cryotherapy  5 Minutes    Cryotherapy Location  Shoulder    Type of Cryotherapy  Ice pack      Manual Therapy   Manual therapy comments  joint oscillation, distraction     Passive ROM  all planes to tolerance              PT Education - 07/18/17 1544    Education provided  Yes    Education Details  new HEP , progress     Person(s) Educated  Patient    Methods  Explanation;Demonstration    Comprehension  Returned demonstration;Verbalized understanding       PT Short Term Goals - 07/18/17 1456      PT SHORT TERM GOAL #1   Title  Pt will be I with initial HEP for Rt. UE A/AROM and strength     Status  Achieved      PT SHORT TERM GOAL #2   Title  Pt will be able to lift Rt. arm for eating without use of Lt UE to assist.     Baseline  still has to use L hand sometimes but its better     Status  On-going      PT SHORT TERM GOAL #3   Title  Pt will be able to walk with bilateral arms extended and normalized arm swing when cued     Status  Achieved        PT Long Term Goals - 07/18/17 1457      PT LONG TERM GOAL #1   Title  Pt will improve FOTO score to less than 50% limited.     Status  Unable to assess      PT LONG TERM GOAL #2   Title  Pt will be able to eat with Rt UE without limitation of pain.     Status  On-going      PT LONG TERM GOAL #3   Title  Pt will be able to raise Rt. arm  to 120 degrees to retrieve items from cabinet.     Status  On-going      PT LONG TERM GOAL #4   Title  Pt will demo Rt. UE strength to at least 3+/5 for increased ability to carry, lift items safely.     Status  On-going      PT LONG TERM GOAL #5   Title  Pt will be I with HEP as of last visit     Status  On-going            Plan - 07/18/17 1542    Clinical Impression Statement  AROM and strength improving.  upgraded HEP to include row and light bands for flexion, ER.  Pain remained 3/10 after exercise.     PT Next Visit Plan  Progress ROm and strength, UE ranger , isometrics, ice , manual     PT Home Exercise Plan  Table slides for AAROM , isometrics (DC), row and ER, flexion     Consulted and Agree with Plan of Care  Patient       Patient will benefit from skilled therapeutic intervention in order to improve the following deficits and impairments:  Hypomobility, Impaired sensation, Increased edema, Decreased strength, Increased fascial restricitons, Pain, Impaired UE functional use, Obesity, Decreased mobility, Decreased range of motion, Impaired flexibility, Postural dysfunction  Visit Diagnosis: Acute pain of right shoulder  Stiffness of right elbow, not elsewhere classified  Stiffness of right shoulder, not elsewhere classified  Localized edema  Muscle weakness (generalized)     Problem List Patient Active Problem List   Diagnosis Date Noted  . Right humeral fracture 05/24/2017  . Osteoporosis 05/14/2017  . Fall 05/13/2017  . Secondary male hypogonadism 02/07/2017  . Hypomagnesemia 12/28/2016  . Long-term current use of opiate analgesic 09/07/2016  . Mild carpal tunnel syndrome of right wrist 01/24/2016  . C6 radiculopathy 01/24/2016  . Paroxysmal atrial fibrillation ([place]) 10/25/2015  . Alcohol abuse 02/28/2015  . Constipation due to opioid therapy 12/24/2014  . Venous stasis ulcer of left lower extremity ([place]) 08/06/2014  . Encounter for therapeutic drug  monitoring 11/16/2013  . Diverticulosis 11/12/2013  . Post-traumatic osteoarthritis of left knee 06/19/2013  . Obstructive sleep apnea 06/01/2013  . Osteoarthritis cervical spine 04/25/2013  . Gastroesophageal reflux disease without esophagitis 04/25/2013  . Vasomotor rhinitis 04/25/2013  . Open-angle glaucoma 04/25/2013  . Hyperlipidemia 04/25/2013  . 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 ([place]) 04/25/2013  . Coronary artery disease involving native coronary artery with angina pectoris ([place]) 04/25/2013  . Chronic asthmatic bronchitis ([place]) 04/25/2013  . Idiopathic chronic gout without tophus 04/25/2013  . Severe obesity with body mass index (BMI) of 35.0 to 39.9 with comorbidity ([place]) 04/25/2013  . Right rotator cuff tear 04/25/2013  . Healthcare maintenance 01/15/2013  . Chronic pain syndrome 01/15/2013  . Chronic diastolic heart failure ([place]) 02/04/2012  . Essential hypertension 09/20/2011    PAA,JENNIFER 07/18/2017, 3:45 PM  [facility] [address] [place], [place], [address] Phone: [phone]  Fax:  [phone]  Name: Juan Calderon MRN: [id number] Date of Birth: May 14, 1956  Raeford Razor, PT 07/18/17 3:45 PM Phone: [phone] Fax: [phone]

## 2017-07-18 NOTE — Patient Instructions (Signed)
  You can stop the isometrics (hold 5 sec) and do these instead!  USE YELLOW BAND    Strengthening: Resisted Flexion    Hold tubing with left arm at side. Pull forward and up. Move shoulder through pain-free range of motion. Repeat ___10_ times per set. Do _2__ sets per session. Do ___2_ sessions per day.  http://orth.exer.us/824   Copyright  VHI. All rights reserved.    http://orth.exer.us/824   Copyright  VHI. All rights reserved.  Rotation: External (Single Arm)   YELLOW BAND    Side toward anchor in shoulder width stance with elbow bent to 90, arm across mid-section. Thumb up, pull arm away from body, keeping elbow bent. Repeat _10_ times per set. Repeat with other arm. Do 2__ sets per session. Do _2_ sessions per day Anchor Height: Waist  http://tub.exer.us/115   Copyright  VHI. All rights reserved.    Low Row: Standing    Face anchor, feet shoulder width apart. Palms up, pull arms back, squeezing shoulder blades together. USE GREEN (HEavier band)  Repeat _20_ times per set. Do _1-2_ sets per session. Do _2_ sessions per day Anchor Height: Waist  http://tub.exer.us/65   Copyright  VHI. All rights reserved.

## 2017-07-22 ENCOUNTER — Ambulatory Visit: Payer: Medicare Other | Admitting: Physical Therapy

## 2017-07-22 ENCOUNTER — Encounter: Payer: Self-pay | Admitting: Physical Therapy

## 2017-07-22 DIAGNOSIS — M25611 Stiffness of right shoulder, not elsewhere classified: Secondary | ICD-10-CM | POA: Diagnosis not present

## 2017-07-22 DIAGNOSIS — M25621 Stiffness of right elbow, not elsewhere classified: Secondary | ICD-10-CM

## 2017-07-22 DIAGNOSIS — R600 Localized edema: Secondary | ICD-10-CM

## 2017-07-22 DIAGNOSIS — M6281 Muscle weakness (generalized): Secondary | ICD-10-CM | POA: Diagnosis not present

## 2017-07-22 DIAGNOSIS — M25511 Pain in right shoulder: Secondary | ICD-10-CM

## 2017-07-22 NOTE — Therapy (Signed)
[place], [place], [address] Phone: [phone]   Fax:  [phone]  Physical Therapy Treatment  Patient Details  Name: Juan Calderon MRN: [id number] Date of Birth: [birth year] Referring Provider: Dr. Oval Linsey   Encounter Date: 07/22/2017  PT End of Session - 07/22/17 1501    Visit Number  6    Number of Visits  16    Date for PT Re-Evaluation  08/29/17    PT Start Time  5093    PT Stop Time  1506    PT Time Calculation (min)  54 min    Activity Tolerance  Patient tolerated treatment well    Behavior During Therapy  [facility] for tasks assessed/performed;Flat affect       Past Medical History:  Diagnosis Date  . Alcohol abuse    . C6 radiculopathy 01/24/2016   Right upper extremity, mild to moderate electrically by EMG on 01/24/2016  . Cataract    Left eye  . Chronic diastolic heart failure ([place])     with mild left ventricular hypertrophy on Echo 02/2010  . Chronic obstructive pulmonary disease ([place])    . Chronic osteomyelitis of femur ([place]) 04/06/2016  . Chronic pain syndrome     Left arm and leg s/p traumatic injury   . Chronic renal insufficiency    . Coronary artery disease     25% LAD stenosis on cath 2007.  Stable angina.  . Diverticulosis    . Essential hypertension    . Gastroesophageal reflux disease    . Gout    . Hyperlipidemia LDL goal < 100    . Long-term current use of opiate analgesic 09/07/2016  . Mild carpal tunnel syndrome of right wrist 01/24/2016   Mild degree electrically per EMG 01/24/2016   . Morbid obesity with BMI of 40.0-44.9, adult ([place])    . Normocytic anemia    . Obstructive sleep apnea     Moderate, AHI 29.8 per hour with moderately loud snoring and oxygen desaturation to a nadir of 79%. CPAP titration resulted in a prescription for 17 CWP.    [name] Open-angle glaucoma    . Osteoarthritis cervical spine    . Osteoarthritis of left knee 06/19/2013   Tricompartmental  disease.  Treated with double hinged upright knee brace, steroid/xylocaine knee injections, and NSAIDs   . Osteoporosis 05/14/2017   s/p fracture of the right humerus from a fall at ground hight  . Paroxysmal atrial fibrillation ([place]) 10/25/2015  . Persistent atrial fibrillation ([place])    . Right rotator cuff tear     Large full-thickness tear of the supraspinatus with mild retraction but no atrophy   . Secondary male hypogonadism 02/07/2017   Likely secondary to chronic opioid use  . Subclinical hypothyroidism    . 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 [facility])      Past Surgical History:  Procedure Laterality Date  . CARDIOVERSION N/A 12/30/2014   Performed by [name], MD at [facility] ENDOSCOPY  . FRACTURE SURGERY Left 1980's   Elbow  . Left arm surgery    . Left leg surgery    . SHOULDER SURGERY     Right    There were no vitals filed for this visit.  Subjective Assessment - 07/22/17 1413    Subjective  Its hurting a little.  2/10.  Getting better, less pain overall     Currently in Pain?  Yes    Pain Score  2     Pain Location  Shoulder    Pain Orientation  Right    Pain Descriptors / Indicators  Aching    Pain Type  Chronic pain    Pain Onset  More than a month ago    Pain Frequency  Intermittent    Aggravating Factors   using arm, reaching     Pain Relieving Factors  rest, meds, pillow                       [facility] Adult PT Treatment/Exercise - 07/22/17 0001      Shoulder Exercises: Supine   Horizontal ABduction  Strengthening;Both;20 reps;Theraband yellow    External Rotation  Strengthening;Both;10 reps isometric with magic circle     Flexion  Strengthening;Both;15 reps;Other (comment) 2 sets     Other Supine Exercises  overhead arms with magic circle     Other Supine Exercises  used magic circle for add and abd       Shoulder Exercises: Standing   External Rotation  Strengthening;Right;20 reps    Flexion   Strengthening;Right;10 reps yellow    Extension  Strengthening;Both;15 reps;Theraband    Row  Strengthening;Both;20 reps;Other (comment) green  2 sets 1 waist high and one high row       Shoulder Exercises: Pulleys   Flexion  3 minutes    ABduction  1 minute      Shoulder Exercises: ROM/Strengthening   Wall Wash  flexion, scaption  with lift off x 10     Wall Pushups  20 reps    Ball on Wall  1 min x 2 each direction     Rhythmic Stabilization, Supine  3 min       Manual Therapy   Manual therapy comments  joint oscillation, distraction     Soft tissue mobilization  Rt. UE deltoid, pec minor     Myofascial Release  Rt. UE lateral and anterior     Passive ROM  all planes to tolerance              PT Education - 07/22/17 1500    Education provided  Yes    Education Details  location of fracture, soft tissue     Person(s) Educated  Patient    Methods  Explanation    Comprehension  Verbalized understanding       PT Short Term Goals - 07/18/17 1456      PT SHORT TERM GOAL #1   Title  Pt will be I with initial HEP for Rt. UE A/AROM and strength     Status  Achieved      PT SHORT TERM GOAL #2   Title  Pt will be able to lift Rt. arm for eating without use of Lt UE to assist.     Baseline  still has to use L hand sometimes but its better     Status  On-going      PT SHORT TERM GOAL #3   Title  Pt will be able to walk with bilateral arms extended and normalized arm swing when cued     Status  Achieved        PT Long Term Goals - 07/18/17 1457      PT LONG TERM GOAL #1   Title  Pt will improve FOTO score to less than 50% limited.     Status  Unable to assess      PT LONG TERM GOAL #2  Title  Pt will be able to eat with Rt UE without limitation of pain.     Status  On-going      PT LONG TERM GOAL #3   Title  Pt will be able to raise Rt. arm to 120 degrees to retrieve items from cabinet.     Status  On-going      PT LONG TERM GOAL #4   Title  Pt will demo Rt.  UE strength to at least 3+/5 for increased ability to carry, lift items safely.     Status  On-going      PT LONG TERM GOAL #5   Title  Pt will be I with HEP as of last visit     Status  On-going            Plan - 07/22/17 1501    Clinical Impression Statement  Showed patient XR for location of fracture., Mid shaft of humerus is location of fracture. Reports relief of stiffness post session but returns once he gets home.  Softened and improved PROM into flexion and abduction.     PT Next Visit Plan  Progress ROm and strength, UE ranger , isometrics, ice , manual     PT Home Exercise Plan  Table slides for AAROM , isometrics (DC), row and ER, flexion     Consulted and Agree with Plan of Care  Patient       Patient will benefit from skilled therapeutic intervention in order to improve the following deficits and impairments:  Hypomobility, Impaired sensation, Increased edema, Decreased strength, Increased fascial restricitons, Pain, Impaired UE functional use, Obesity, Decreased mobility, Decreased range of motion, Impaired flexibility, Postural dysfunction  Visit Diagnosis: Acute pain of right shoulder  Stiffness of right elbow, not elsewhere classified  Stiffness of right shoulder, not elsewhere classified  Muscle weakness (generalized)  Localized edema     Problem List Patient Active Problem List   Diagnosis Date Noted  . Right humeral fracture 05/24/2017  . Osteoporosis 05/14/2017  . Fall 05/13/2017  . Secondary male hypogonadism 02/07/2017  . Hypomagnesemia 12/28/2016  . Long-term current use of opiate analgesic 09/07/2016  . Mild carpal tunnel syndrome of right wrist 01/24/2016  . C6 radiculopathy 01/24/2016  . Paroxysmal atrial fibrillation ([place]) 10/25/2015  . Alcohol abuse 02/28/2015  . Constipation due to opioid therapy 12/24/2014  . Venous stasis ulcer of left lower extremity ([place]) 08/06/2014  . Encounter for therapeutic drug monitoring 11/16/2013  .  Diverticulosis 11/12/2013  . Post-traumatic osteoarthritis of left knee 06/19/2013  . Obstructive sleep apnea 06/01/2013  . Osteoarthritis cervical spine 04/25/2013  . Gastroesophageal reflux disease without esophagitis 04/25/2013  . Vasomotor rhinitis 04/25/2013  . Open-angle glaucoma 04/25/2013  . Hyperlipidemia 04/25/2013  . 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 ([place]) 04/25/2013  . Coronary artery disease involving native coronary artery with angina pectoris ([place]) 04/25/2013  . Chronic asthmatic bronchitis ([place]) 04/25/2013  . Idiopathic chronic gout without tophus 04/25/2013  . Severe obesity with body mass index (BMI) of 35.0 to 39.9 with comorbidity ([place]) 04/25/2013  . Right rotator cuff tear 04/25/2013  . Healthcare maintenance 01/15/2013  . Chronic pain syndrome 01/15/2013  . Chronic diastolic heart failure ([place]) 02/04/2012  . Essential hypertension 09/20/2011    Forrester Blando 07/22/2017, 3:04 PM  [facility] [address] [place], [place], [address] Phone: [phone]   Fax:  [phone]  Name: Juan Calderon MRN: [id number] Date of Birth: 21-Apr-1956  Raeford Razor, PT 07/22/17 3:05 PM Phone: [phone] Fax: [phone]

## 2017-07-23 ENCOUNTER — Ambulatory Visit: Payer: Medicare Other

## 2017-07-23 DIAGNOSIS — M25621 Stiffness of right elbow, not elsewhere classified: Secondary | ICD-10-CM

## 2017-07-23 DIAGNOSIS — R600 Localized edema: Secondary | ICD-10-CM

## 2017-07-23 DIAGNOSIS — M6281 Muscle weakness (generalized): Secondary | ICD-10-CM | POA: Diagnosis not present

## 2017-07-23 DIAGNOSIS — M25511 Pain in right shoulder: Secondary | ICD-10-CM | POA: Diagnosis not present

## 2017-07-23 DIAGNOSIS — M25611 Stiffness of right shoulder, not elsewhere classified: Secondary | ICD-10-CM

## 2017-07-23 NOTE — Therapy (Signed)
[place], [place], [address] Phone: [phone]   Fax:  [phone]  Physical Therapy Treatment  Patient Details  Name: Juan Calderon MRN: [id number] Date of Birth: [birth year] Referring Provider: Dr. Oval Linsey   Encounter Date: 07/23/2017  PT End of Session - 07/23/17 1447    Visit Number  7    Number of Visits  16    Date for PT Re-Evaluation  08/29/17    PT Start Time  0245    PT Stop Time  0340    PT Time Calculation (min)  55 min    Activity Tolerance  Patient tolerated treatment well    Behavior During Therapy  [facility] for tasks assessed/performed;Flat affect       Past Medical History:  Diagnosis Date  . Alcohol abuse    . C6 radiculopathy 01/24/2016   Right upper extremity, mild to moderate electrically by EMG on 01/24/2016  . Cataract    Left eye  . Chronic diastolic heart failure ([place])     with mild left ventricular hypertrophy on Echo 02/2010  . Chronic obstructive pulmonary disease ([place])    . Chronic osteomyelitis of femur ([place]) 04/06/2016  . Chronic pain syndrome     Left arm and leg s/p traumatic injury   . Chronic renal insufficiency    . Coronary artery disease     25% LAD stenosis on cath 2007.  Stable angina.  . Diverticulosis    . Essential hypertension    . Gastroesophageal reflux disease    . Gout    . Hyperlipidemia LDL goal < 100    . Long-term current use of opiate analgesic 09/07/2016  . Mild carpal tunnel syndrome of right wrist 01/24/2016   Mild degree electrically per EMG 01/24/2016   . Morbid obesity with BMI of 40.0-44.9, adult ([place])    . Normocytic anemia    . Obstructive sleep apnea     Moderate, AHI 29.8 per hour with moderately loud snoring and oxygen desaturation to a nadir of 79%. CPAP titration resulted in a prescription for 17 CWP.    [name] Open-angle glaucoma    . Osteoarthritis cervical spine    . Osteoarthritis of left knee 06/19/2013   Tricompartmental  disease.  Treated with double hinged upright knee brace, steroid/xylocaine knee injections, and NSAIDs   . Osteoporosis 05/14/2017   s/p fracture of the right humerus from a fall at ground hight  . Paroxysmal atrial fibrillation ([place]) 10/25/2015  . Persistent atrial fibrillation ([place])    . Right rotator cuff tear     Large full-thickness tear of the supraspinatus with mild retraction but no atrophy   . Secondary male hypogonadism 02/07/2017   Likely secondary to chronic opioid use  . Subclinical hypothyroidism    . 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 [facility])      Past Surgical History:  Procedure Laterality Date  . CARDIOVERSION N/A 12/30/2014   Procedure: CARDIOVERSION;  Surgeon: [name], MD;  Location: [facility] ENDOSCOPY;  Service: Cardiovascular;  Laterality: N/A;  . FRACTURE SURGERY Left 1980's   Elbow  . Left arm surgery    . Left leg surgery    . SHOULDER SURGERY     Right    There were no vitals filed for this visit.  Subjective Assessment - 07/23/17 1448    Subjective  Its hurting a little.  2/10.     Currently in Pain?  Yes  Pain Score  2     Pain Location  Shoulder    Pain Orientation  Right    Pain Descriptors / Indicators  Aching    Pain Type  Chronic pain    Pain Onset  More than a month ago    Pain Frequency  Intermittent                      [facility] Adult PT Treatment/Exercise - 07/23/17 0001      Shoulder Exercises: Supine   Protraction  Strengthening;Right;20 reps    Horizontal ABduction  Strengthening;Right;15 reps    External Rotation  Strengthening;Right;15 reps 5 pounds    Internal Rotation  Right;15 reps;Strengthening 5 pounds      Shoulder Exercises: Seated   Other Seated Exercises  bicep curls 6 pounds x 20      Shoulder Exercises: Standing   External Rotation  Strengthening;Right;20 reps;Theraband red    ABduction  Strengthening;Right;15 reps;Theraband red    Extension  Strengthening;Right;15  reps;Theraband red    Row  Strengthening;Right;15 reps;Theraband red    Other Standing Exercises  10 pound lift in bent row posiiton x 15      Shoulder Exercises: Pulleys   Flexion  2 minutes    ABduction  1 minute      Shoulder Exercises: ROM/Strengthening   Wall Wash  flexion, scaption  with lift off x 15    Wall Pushups  20 reps from counter    Ball on Wall  30 circles IR ER forward and lateral ( elbow into ball)      Shoulder Exercises: Body Blade   ABduction  30 seconds;2 reps    External Rotation  30 seconds;2 reps    External Rotation Limitations  held at end rather than in middle      Modalities   Modalities  Moist Heat      Moist Heat Therapy   Number Minutes Moist Heat  10 Minutes    Moist Heat Location  Shoulder RT      Manual Therapy   Manual therapy comments  joint oscillation, distraction     Soft tissue mobilization  RT pectorals and deltoid    Passive ROM  all planes to tolerance              PT Education - 07/22/17 1500    Education provided  Yes    Education Details  location of fracture, soft tissue     Person(s) Educated  Patient    Methods  Explanation    Comprehension  Verbalized understanding       PT Short Term Goals - 07/23/17 1535      PT SHORT TERM GOAL #1   Title  Pt will be I with initial HEP for Rt. UE A/AROM and strength     Status  Achieved      PT SHORT TERM GOAL #2   Title  Pt will be able to lift Rt. arm for eating without use of Lt UE to assist.     Baseline  still has to use L hand sometimes but its better     Status  On-going      PT SHORT TERM GOAL #3   Title  Pt will be able to walk with bilateral arms extended and normalized arm swing when cued     Status  Achieved        PT Long Term Goals - 07/18/17 1457  PT LONG TERM GOAL #1   Title  Pt will improve FOTO score to less than 50% limited.     Status  Unable to assess      PT LONG TERM GOAL #2   Title  Pt will be able to eat with Rt UE without  limitation of pain.     Status  On-going      PT LONG TERM GOAL #3   Title  Pt will be able to raise Rt. arm to 120 degrees to retrieve items from cabinet.     Status  On-going      PT LONG TERM GOAL #4   Title  Pt will demo Rt. UE strength to at least 3+/5 for increased ability to carry, lift items safely.     Status  On-going      PT LONG TERM GOAL #5   Title  Pt will be I with HEP as of last visit     Status  On-going            Plan - 07/23/17 1534    Clinical Impression Statement  No complaints during session. Tolerated stretchiing and weights.   Appears to be progressing    PT Treatment/Interventions  ADLs/Self Care Home Management;Patient/family education;Taping;Cryotherapy;Functional mobility training;Manual techniques;Therapeutic activities;Therapeutic exercise;Ultrasound;Neuromuscular re-education;Passive range of motion    PT Next Visit Plan  Progress ROm and strength, UE ranger , isometrics, ice , manual     PT Home Exercise Plan  Table slides for AAROM , isometrics (DC), row and ER, flexion     Consulted and Agree with Plan of Care  Patient       Patient will benefit from skilled therapeutic intervention in order to improve the following deficits and impairments:  Hypomobility, Impaired sensation, Increased edema, Decreased strength, Increased fascial restricitons, Pain, Impaired UE functional use, Obesity, Decreased mobility, Decreased range of motion, Impaired flexibility, Postural dysfunction  Visit Diagnosis: Acute pain of right shoulder  Stiffness of right elbow, not elsewhere classified  Stiffness of right shoulder, not elsewhere classified  Muscle weakness (generalized)  Localized edema     Problem List Patient Active Problem List   Diagnosis Date Noted  . Right humeral fracture 05/24/2017  . Osteoporosis 05/14/2017  . Fall 05/13/2017  . Secondary male hypogonadism 02/07/2017  . Hypomagnesemia 12/28/2016  . Long-term current use of opiate  analgesic 09/07/2016  . Mild carpal tunnel syndrome of right wrist 01/24/2016  . C6 radiculopathy 01/24/2016  . Paroxysmal atrial fibrillation ([place]) 10/25/2015  . Alcohol abuse 02/28/2015  . Constipation due to opioid therapy 12/24/2014  . Venous stasis ulcer of left lower extremity ([place]) 08/06/2014  . Encounter for therapeutic drug monitoring 11/16/2013  . Diverticulosis 11/12/2013  . Post-traumatic osteoarthritis of left knee 06/19/2013  . Obstructive sleep apnea 06/01/2013  . Osteoarthritis cervical spine 04/25/2013  . Gastroesophageal reflux disease without esophagitis 04/25/2013  . Vasomotor rhinitis 04/25/2013  . Open-angle glaucoma 04/25/2013  . Hyperlipidemia 04/25/2013  . 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 ([place]) 04/25/2013  . Coronary artery disease involving native coronary artery with angina pectoris ([place]) 04/25/2013  . Chronic asthmatic bronchitis ([place]) 04/25/2013  . Idiopathic chronic gout without tophus 04/25/2013  . Severe obesity with body mass index (BMI) of 35.0 to 39.9 with comorbidity ([place]) 04/25/2013  . Right rotator cuff tear 04/25/2013  . Healthcare maintenance 01/15/2013  . Chronic pain syndrome 01/15/2013  . Chronic diastolic heart failure ([place]) 02/04/2012  . Essential hypertension 09/20/2011  Darrel Hoover  PT 07/23/2017, 3:36 PM  [facility] [name] [place], [place], [address] Phone: [phone]   Fax:  [phone]  Name: Juan Calderon MRN: [id number] Date of Birth: September 01, 1956

## 2017-07-30 ENCOUNTER — Ambulatory Visit: Payer: Medicare Other | Admitting: Physical Therapy

## 2017-07-30 DIAGNOSIS — M6281 Muscle weakness (generalized): Secondary | ICD-10-CM

## 2017-07-30 DIAGNOSIS — M25511 Pain in right shoulder: Secondary | ICD-10-CM | POA: Diagnosis not present

## 2017-07-30 DIAGNOSIS — M25611 Stiffness of right shoulder, not elsewhere classified: Secondary | ICD-10-CM

## 2017-07-30 DIAGNOSIS — R600 Localized edema: Secondary | ICD-10-CM

## 2017-07-30 DIAGNOSIS — M25621 Stiffness of right elbow, not elsewhere classified: Secondary | ICD-10-CM

## 2017-07-30 NOTE — Therapy (Signed)
[place], [place], [address] Phone: [phone]   Fax:  [phone]  Physical Therapy Treatment  Patient Details  Name: Juan Calderon MRN: [id number] Date of Birth: [birth year] Referring Provider: Dr. Oval Linsey   Encounter Date: 07/30/2017  PT End of Session - 07/30/17 1539    Visit Number  8    Number of Visits  16    Date for PT Re-Evaluation  08/29/17    PT Start Time  1525 pt early for 345 appt    PT Stop Time  1623    PT Time Calculation (min)  58 min    Activity Tolerance  Patient tolerated treatment well    Behavior During Therapy  [facility] for tasks assessed/performed;Flat affect       Past Medical History:  Diagnosis Date  . Alcohol abuse    . C6 radiculopathy 01/24/2016   Right upper extremity, mild to moderate electrically by EMG on 01/24/2016  . Cataract    Left eye  . Chronic diastolic heart failure ([place])     with mild left ventricular hypertrophy on Echo 02/2010  . Chronic obstructive pulmonary disease ([place])    . Chronic osteomyelitis of femur ([place]) 04/06/2016  . Chronic pain syndrome     Left arm and leg s/p traumatic injury   . Chronic renal insufficiency    . Coronary artery disease     25% LAD stenosis on cath 2007.  Stable angina.  . Diverticulosis    . Essential hypertension    . Gastroesophageal reflux disease    . Gout    . Hyperlipidemia LDL goal < 100    . Long-term current use of opiate analgesic 09/07/2016  . Mild carpal tunnel syndrome of right wrist 01/24/2016   Mild degree electrically per EMG 01/24/2016   . Morbid obesity with BMI of 40.0-44.9, adult ([place])    . Normocytic anemia    . Obstructive sleep apnea     Moderate, AHI 29.8 per hour with moderately loud snoring and oxygen desaturation to a nadir of 79%. CPAP titration resulted in a prescription for 17 CWP.    [name] Open-angle glaucoma    . Osteoarthritis cervical spine    . Osteoarthritis of left knee 06/19/2013    Tricompartmental disease.  Treated with double hinged upright knee brace, steroid/xylocaine knee injections, and NSAIDs   . Osteoporosis 05/14/2017   s/p fracture of the right humerus from a fall at ground hight  . Paroxysmal atrial fibrillation ([place]) 10/25/2015  . Persistent atrial fibrillation ([place])    . Right rotator cuff tear     Large full-thickness tear of the supraspinatus with mild retraction but no atrophy   . Secondary male hypogonadism 02/07/2017   Likely secondary to chronic opioid use  . Subclinical hypothyroidism    . 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 [facility])      Past Surgical History:  Procedure Laterality Date  . CARDIOVERSION N/A 12/30/2014   Procedure: CARDIOVERSION;  Surgeon: [name], MD;  Location: [facility] ENDOSCOPY;  Service: Cardiovascular;  Laterality: N/A;  . FRACTURE SURGERY Left 1980's   Elbow  . Left arm surgery    . Left leg surgery    . SHOULDER SURGERY     Right    There were no vitals filed for this visit.      [facility] PT Assessment - 07/30/17 0001      AROM   Right Shoulder Flexion  93 Degrees    Right Shoulder ABduction  82 Degrees      Strength   Right Shoulder Flexion  3-/5    Right Shoulder ABduction  3-/5        [facility] Adult PT Treatment/Exercise - 07/30/17 0001      Shoulder Exercises: Supine   Horizontal ABduction  Strengthening;Right;15 reps;Theraband    Theraband Level (Shoulder Horizontal ABduction)  Level 2 (Red);Level 3 (Green)    External Rotation  Strengthening;Both;15 reps;Theraband also AAROM with cane     Theraband Level (Shoulder External Rotation)  Level 2 (Red)    Flexion  AAROM;Both;5 reps to 155 deg     ABduction  AAROM;Both;15 reps cane horiz add/abd     Other Supine Exercises  single arm triceps press x 10     Other Supine Exercises  diagonal pull red Rt. x 15, single arm 5 lbs triceps and lat pull       Shoulder Exercises: Standing   Flexion  Strengthening;Right;15  reps;Theraband punch, elbow ext     Theraband Level (Shoulder Flexion)  Level 2 (Red)    ABduction  Strengthening;Right;12 reps    Extension  Strengthening;Both;15 reps    Row  Strengthening;Right;15 reps;Theraband red    Theraband Level (Shoulder Row)  Level 4 (Blue)    Other Standing Exercises  adduction Rt. UE blue  x 20       Shoulder Exercises: ROM/Strengthening   UBE (Upper Arm Bike)  5 min level 1 ,reverse    Other ROM/Strengthening Exercises  corner stretch and ER at doorway       Moist Heat Therapy   Number Minutes Moist Heat  10 Minutes    Moist Heat Location  Shoulder RT      Manual Therapy   Manual therapy comments  joint oscillation, distraction     Joint Mobilization  inf glides Gr IIl seated arm propped     Soft tissue mobilization  Rt. deltoid IASTM    Passive ROM  all planes to tolerance                PT Short Term Goals - 07/23/17 1535      PT SHORT TERM GOAL #1   Title  Pt will be I with initial HEP for Rt. UE A/AROM and strength     Status  Achieved      PT SHORT TERM GOAL #2   Title  Pt will be able to lift Rt. arm for eating without use of Lt UE to assist.     Baseline  still has to use L hand sometimes but its better     Status  On-going      PT SHORT TERM GOAL #3   Title  Pt will be able to walk with bilateral arms extended and normalized arm swing when cued     Status  Achieved        PT Long Term Goals - 07/18/17 1457      PT LONG TERM GOAL #1   Title  Pt will improve FOTO score to less than 50% limited.     Status  Unable to assess      PT LONG TERM GOAL #2   Title  Pt will be able to eat with Rt UE without limitation of pain.     Status  On-going      PT LONG TERM GOAL #3   Title  Pt will be able to raise Rt. arm to 120 degrees  to retrieve items from cabinet.     Status  On-going      PT LONG TERM GOAL #4   Title  Pt will demo Rt. UE strength to at least 3+/5 for increased ability to carry, lift items safely.     Status   On-going      PT LONG TERM GOAL #5   Title  Pt will be I with HEP as of last visit     Status  On-going            Plan - 07/30/17 1616    Clinical Impression Statement  Patient without complaint of pain during session.  Improved AROM and strength.  Sees MD 12/4 (Dr. French Ana?) Urged to cont with PT for strengthening to maximize function.     PT Next Visit Plan  Progress ROm and strength, manual, heat FOTO    PT Home Exercise Plan  Table slides for AAROM , isometrics (DC), row and ER, flexion     Consulted and Agree with Plan of Care  Patient       Patient will benefit from skilled therapeutic intervention in order to improve the following deficits and impairments:  Hypomobility, Impaired sensation, Increased edema, Decreased strength, Increased fascial restricitons, Pain, Impaired UE functional use, Obesity, Decreased mobility, Decreased range of motion, Impaired flexibility, Postural dysfunction  Visit Diagnosis: Acute pain of right shoulder  Stiffness of right elbow, not elsewhere classified  Stiffness of right shoulder, not elsewhere classified  Muscle weakness (generalized)  Localized edema     Problem List Patient Active Problem List   Diagnosis Date Noted  . Right humeral fracture 05/24/2017  . Osteoporosis 05/14/2017  . Fall 05/13/2017  . Secondary male hypogonadism 02/07/2017  . Hypomagnesemia 12/28/2016  . Long-term current use of opiate analgesic 09/07/2016  . Mild carpal tunnel syndrome of right wrist 01/24/2016  . C6 radiculopathy 01/24/2016  . Paroxysmal atrial fibrillation ([place]) 10/25/2015  . Alcohol abuse 02/28/2015  . Constipation due to opioid therapy 12/24/2014  . Venous stasis ulcer of left lower extremity ([place]) 08/06/2014  . Encounter for therapeutic drug monitoring 11/16/2013  . Diverticulosis 11/12/2013  . Post-traumatic osteoarthritis of left knee 06/19/2013  . Obstructive sleep apnea 06/01/2013  . Osteoarthritis cervical spine 04/25/2013   . Gastroesophageal reflux disease without esophagitis 04/25/2013  . Vasomotor rhinitis 04/25/2013  . Open-angle glaucoma 04/25/2013  . Hyperlipidemia 04/25/2013  . 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 ([place]) 04/25/2013  . Coronary artery disease involving native coronary artery with angina pectoris ([place]) 04/25/2013  . Chronic asthmatic bronchitis ([place]) 04/25/2013  . Idiopathic chronic gout without tophus 04/25/2013  . Severe obesity with body mass index (BMI) of 35.0 to 39.9 with comorbidity ([place]) 04/25/2013  . Right rotator cuff tear 04/25/2013  . Healthcare maintenance 01/15/2013  . Chronic pain syndrome 01/15/2013  . Chronic diastolic heart failure ([place]) 02/04/2012  . Essential hypertension 09/20/2011    , 07/30/2017, 4:18 PM  [facility] [address] [place], [place], [address] Phone: [phone]   Fax:  [phone]  Name: Juan Calderon MRN: [id number] Date of Birth: [birth year]  Raeford Razor, PT 07/30/17 4:19 PM Phone: [phone] Fax: [phone]

## 2017-08-01 ENCOUNTER — Ambulatory Visit: Payer: Medicare Other

## 2017-08-01 DIAGNOSIS — M25511 Pain in right shoulder: Secondary | ICD-10-CM

## 2017-08-01 DIAGNOSIS — M6281 Muscle weakness (generalized): Secondary | ICD-10-CM

## 2017-08-01 DIAGNOSIS — M25611 Stiffness of right shoulder, not elsewhere classified: Secondary | ICD-10-CM | POA: Diagnosis not present

## 2017-08-01 DIAGNOSIS — R600 Localized edema: Secondary | ICD-10-CM | POA: Diagnosis not present

## 2017-08-01 DIAGNOSIS — M25621 Stiffness of right elbow, not elsewhere classified: Secondary | ICD-10-CM | POA: Diagnosis not present

## 2017-08-01 NOTE — Therapy (Signed)
[place] Calder[place], [place], [address] Phone: [phone]   Fax:  [phone]  Physical Therapy Treatment  Patient Details  Name: Juan Calderon MRN: [id number] Date of Birth: 05-Jun-1956 Referring Provider: Dr. Oval Linsey   Encounter Date: 08/01/2017  PT End of Session - 08/01/17 1519    Visit Number  9    Number of Visits  16    Date for PT Re-Evaluation  08/29/17    PT Start Time  0318    PT Stop Time  0410    PT Time Calculation (min)  52 min    Activity Tolerance  Patient tolerated treatment well    Behavior During Therapy  [facility] for tasks assessed/performed;Flat affect       Past Medical History:  Diagnosis Date  . Alcohol abuse    . C6 radiculopathy 01/24/2016   Right upper extremity, mild to moderate electrically by EMG on 01/24/2016  . Cataract    Left eye  . Chronic diastolic heart failure ([place])     with mild left ventricular hypertrophy on Echo 02/2010  . Chronic obstructive pulmonary disease ([place])    . Chronic osteomyelitis of femur ([place]) 04/06/2016  . Chronic pain syndrome     Left arm and leg s/p traumatic injury   . Chronic renal insufficiency    . Coronary artery disease     25% LAD stenosis on cath 2007.  Stable angina.  . Diverticulosis    . Essential hypertension    . Gastroesophageal reflux disease    . Gout    . Hyperlipidemia LDL goal < 100    . Long-term current use of opiate analgesic 09/07/2016  . Mild carpal tunnel syndrome of right wrist 01/24/2016   Mild degree electrically per EMG 01/24/2016   . Morbid obesity with BMI of 40.0-44.9, adult ([place])    . Normocytic anemia    . Obstructive sleep apnea     Moderate, AHI 29.8 per hour with moderately loud snoring and oxygen desaturation to a nadir of 79%. CPAP titration resulted in a prescription for 17 CWP.    [name] Open-angle glaucoma    . Osteoarthritis cervical spine    . Osteoarthritis of left knee 06/19/2013   Tricompartmental  disease.  Treated with double hinged upright knee brace, steroid/xylocaine knee injections, and NSAIDs   . Osteoporosis 05/14/2017   s/p fracture of the right humerus from a fall at ground hight  . Paroxysmal atrial fibrillation ([place]) 10/25/2015  . Persistent atrial fibrillation ([place])    . Right rotator cuff tear     Large full-thickness tear of the supraspinatus with mild retraction but no atrophy   . Secondary male hypogonadism 02/07/2017   Likely secondary to chronic opioid use  . Subclinical hypothyroidism    . 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 [facility])      Past Surgical History:  Procedure Laterality Date  . CARDIOVERSION N/A 12/30/2014   Procedure: CARDIOVERSION;  Surgeon: [name], MD;  Location: [facility] ENDOSCOPY;  Service: Cardiovascular;  Laterality: N/A;  . FRACTURE SURGERY Left 1980's   Elbow  . Left arm surgery    . Left leg surgery    . SHOULDER SURGERY     Right    There were no vitals filed for this visit.  Subjective Assessment - 08/01/17 1617    Subjective  Its hurting a little.  2/10. Doing better    Currently in Pain?  Yes  Pain Score  2     Pain Location  Shoulder    Pain Orientation  Right    Pain Descriptors / Indicators  Aching    Pain Type  Chronic pain    Pain Onset  More than a month ago    Pain Frequency  Intermittent    Aggravating Factors   tretching , using arm    Pain Relieving Factors  rest , meds support    Multiple Pain Sites  No         [facility] PT Assessment - 08/01/17 0001      Observation/Other Assessments   Focus on Therapeutic Outcomes (FOTO)   57% limited      AROM   Right Shoulder Flexion  93 Degrees    Right Shoulder ABduction  82 Degrees    Right Shoulder External Rotation  32 Degrees      PROM   Right/Left Shoulder  Left    Left Shoulder Flexion  135 Degrees    Left Shoulder ABduction  110 Degrees    Left Shoulder Internal Rotation  40 Degrees    Left Shoulder External Rotation  74  Degrees      Strength   Right Shoulder Flexion  3-/5    Right Shoulder ABduction  3-/5                  [facility] Adult PT Treatment/Exercise - 08/01/17 0001      Shoulder Exercises: Standing   External Rotation  Strengthening;Right;15 reps blue band    Flexion  Strengthening;Right;15 reps;Theraband    Theraband Level (Shoulder Flexion)  Level 2 (Red);Level 4 (Blue) punch forward    ABduction  Strengthening;Right;12 reps    Extension  Strengthening;Both;15 reps    Row  Strengthening;Right;15 reps;Theraband    Theraband Level (Shoulder Row)  Level 4 (Blue)    Other Standing Exercises  adduction Rt. UE blue  x 20       Shoulder Exercises: ROM/Strengthening   UBE (Upper Arm Bike)  6 min level 1 ,reverse 3 min forward 3 min    Other ROM/Strengthening Exercises  corner stretch and ER at doorway       Moist Heat Therapy   Number Minutes Moist Heat  10 Minutes RT shoulder     Manual Therapy   Manual therapy comments  joint oscillation, distraction     Joint Mobilization  inf glides Gr IIl seated arm propped     Passive ROM  all planes to tolerance                PT Short Term Goals - 08/01/17 1618      PT SHORT TERM GOAL #1   Title  Pt will be I with initial HEP for Rt. UE A/AROM and strength     Status  Achieved      PT SHORT TERM GOAL #2   Title  Pt will be able to lift Rt. arm for eating without use of Lt UE to assist.     Status  On-going      PT SHORT TERM GOAL #3   Title  Pt will be able to walk with bilateral arms extended and normalized arm swing when cued     Status  Achieved        PT Long Term Goals - 08/01/17 1619      PT LONG TERM GOAL #1   Title  Pt will improve FOTO score to less than 50% limited.  Baseline  56% limited today    Status  On-going      PT LONG TERM GOAL #2   Title  Pt will be able to eat with Rt UE without limitation of pain.     Status  On-going      PT LONG TERM GOAL #3   Title  Pt will be able to raise Rt. arm  to 120 degrees to retrieve items from cabinet.     Baseline  93 degrees today    Status  On-going      PT LONG TERM GOAL #4   Title  Pt will demo Rt. UE strength to at least 3+/5 for increased ability to carry, lift items safely.     Baseline  still 3-/5 with flexion , abduction ,ER    Status  On-going      PT LONG TERM GOAL #5   Title  Pt will be I with HEP as of last visit     Status  On-going            Plan - 2017/08/15 1519    Clinical Impression Statement  No complaints during  stretching except reports some mild incr soreness that is eased with heat. Though still limited the PROM is good.  considering fracture.     PT Treatment/Interventions  ADLs/Self Care Home Management;Patient/family education;Taping;Cryotherapy;Functional mobility training;Manual techniques;Therapeutic activities;Therapeutic exercise;Ultrasound;Neuromuscular re-education;Passive range of motion    PT Next Visit Plan  Progress ROm and strength, manual, heat     PT Home Exercise Plan  Table slides for AAROM , isometrics (DC), row and ER, flexion     Consulted and Agree with Plan of Care  Patient       Patient will benefit from skilled therapeutic intervention in order to improve the following deficits and impairments:  Hypomobility, Impaired sensation, Increased edema, Decreased strength, Increased fascial restricitons, Pain, Impaired UE functional use, Obesity, Decreased mobility, Decreased range of motion, Impaired flexibility, Postural dysfunction  Visit Diagnosis: Acute pain of right shoulder  Stiffness of right elbow, not elsewhere classified  Stiffness of right shoulder, not elsewhere classified  Muscle weakness (generalized)  Localized edema   G-Codes - Aug 15, 2017 1615    Functional Assessment Tool Used (Outpatient Only)  FOTO  56% limited    Functional Limitation  Carrying, moving and handling objects    Carrying, Moving and Handling Objects Current Status (A2505)  At least 40 percent but  less than 60 percent impaired, limited or restricted    Carrying, Moving and Handling Objects Goal Status (L9767)  At least 20 percent but less than 40 percent impaired, limited or restricted       Problem List Patient Active Problem List   Diagnosis Date Noted  . Right humeral fracture 05/24/2017  . Osteoporosis 05/14/2017  . Fall 05/13/2017  . Secondary male hypogonadism 02/07/2017  . Hypomagnesemia 12/28/2016  . Long-term current use of opiate analgesic 09/07/2016  . Mild carpal tunnel syndrome of right wrist 01/24/2016  . C6 radiculopathy 01/24/2016  . Paroxysmal atrial fibrillation ([place]) 10/25/2015  . Alcohol abuse 02/28/2015  . Constipation due to opioid therapy 12/24/2014  . Venous stasis ulcer of left lower extremity ([place]) 08/06/2014  . Encounter for therapeutic drug monitoring 11/16/2013  . Diverticulosis 11/12/2013  . Post-traumatic osteoarthritis of left knee 06/19/2013  . Obstructive sleep apnea 06/01/2013  . Osteoarthritis cervical spine 04/25/2013  . Gastroesophageal reflux disease without esophagitis 04/25/2013  . Vasomotor rhinitis 04/25/2013  . Open-angle glaucoma 04/25/2013  . Hyperlipidemia  04/25/2013  . 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 ([place]) 04/25/2013  . Coronary artery disease involving native coronary artery with angina pectoris ([place]) 04/25/2013  . Chronic asthmatic bronchitis ([place]) 04/25/2013  . Idiopathic chronic gout without tophus 04/25/2013  . Severe obesity with body mass index (BMI) of 35.0 to 39.9 with comorbidity ([place]) 04/25/2013  . Right rotator cuff tear 04/25/2013  . Healthcare maintenance 01/15/2013  . Chronic pain syndrome 01/15/2013  . Chronic diastolic heart failure ([place]) 02/04/2012  . Essential hypertension 09/20/2011    Darrel Hoover  PT 08/01/2017, 4:21 PM  [facility] [address] [place], [place], [address] Phone: [phone]   Fax:   [phone]  Name: JIYAAN STEINHAUSER MRN: [id number] Date of Birth: October 09, 1955

## 2017-08-06 ENCOUNTER — Encounter: Payer: Medicare Other | Admitting: Physical Therapy

## 2017-08-06 DIAGNOSIS — S42294D Other nondisplaced fracture of upper end of right humerus, subsequent encounter for fracture with routine healing: Secondary | ICD-10-CM | POA: Diagnosis not present

## 2017-08-08 ENCOUNTER — Ambulatory Visit: Payer: Medicare Other | Attending: Orthopedic Surgery | Admitting: Physical Therapy

## 2017-08-08 ENCOUNTER — Encounter: Payer: Self-pay | Admitting: Physical Therapy

## 2017-08-08 DIAGNOSIS — M25621 Stiffness of right elbow, not elsewhere classified: Secondary | ICD-10-CM | POA: Diagnosis not present

## 2017-08-08 DIAGNOSIS — R600 Localized edema: Secondary | ICD-10-CM

## 2017-08-08 DIAGNOSIS — M25611 Stiffness of right shoulder, not elsewhere classified: Secondary | ICD-10-CM

## 2017-08-08 DIAGNOSIS — M25511 Pain in right shoulder: Secondary | ICD-10-CM | POA: Diagnosis not present

## 2017-08-08 DIAGNOSIS — M6281 Muscle weakness (generalized): Secondary | ICD-10-CM | POA: Diagnosis not present

## 2017-08-08 NOTE — Therapy (Addendum)
[place], [place], [address] Phone: [phone]   Fax:  [phone]  Physical Therapy Treatment  Patient Details  Name: Juan Calderon MRN: [id number] Date of Birth: Jan 03, 1956 Referring Provider: Dr. Oval Linsey   Encounter Date: 08/08/2017  PT End of Session - 08/08/17 1737    Visit Number  10    Number of Visits  16    Date for PT Re-Evaluation  08/29/17    PT Start Time  2979    PT Stop Time  1640    PT Time Calculation (min)  51 min    Activity Tolerance  Patient tolerated treatment well    Behavior During Therapy  [facility] for tasks assessed/performed       Past Medical History:  Diagnosis Date  . Alcohol abuse    . C6 radiculopathy 01/24/2016   Right upper extremity, mild to moderate electrically by EMG on 01/24/2016  . Cataract    Left eye  . Chronic diastolic heart failure ([place])     with mild left ventricular hypertrophy on Echo 02/2010  . Chronic obstructive pulmonary disease ([place])    . Chronic osteomyelitis of femur ([place]) 04/06/2016  . Chronic pain syndrome     Left arm and leg s/p traumatic injury   . Chronic renal insufficiency    . Coronary artery disease     25% LAD stenosis on cath 2007.  Stable angina.  . Diverticulosis    . Essential hypertension    . Gastroesophageal reflux disease    . Gout    . Hyperlipidemia LDL goal < 100    . Long-term current use of opiate analgesic 09/07/2016  . Mild carpal tunnel syndrome of right wrist 01/24/2016   Mild degree electrically per EMG 01/24/2016   . Morbid obesity with BMI of 40.0-44.9, adult ([place])    . Normocytic anemia    . Obstructive sleep apnea     Moderate, AHI 29.8 per hour with moderately loud snoring and oxygen desaturation to a nadir of 79%. CPAP titration resulted in a prescription for 17 CWP.    [name] Open-angle glaucoma    . Osteoarthritis cervical spine    . Osteoarthritis of left knee 06/19/2013   Tricompartmental disease.   Treated with double hinged upright knee brace, steroid/xylocaine knee injections, and NSAIDs   . Osteoporosis 05/14/2017   s/p fracture of the right humerus from a fall at ground hight  . Paroxysmal atrial fibrillation ([place]) 10/25/2015  . Persistent atrial fibrillation ([place])    . Right rotator cuff tear     Large full-thickness tear of the supraspinatus with mild retraction but no atrophy   . Secondary male hypogonadism 02/07/2017   Likely secondary to chronic opioid use  . Subclinical hypothyroidism    . 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 [facility])      Past Surgical History:  Procedure Laterality Date  . CARDIOVERSION N/A 12/30/2014   Procedure: CARDIOVERSION;  Surgeon: [name], MD;  Location: [facility] ENDOSCOPY;  Service: Cardiovascular;  Laterality: N/A;  . FRACTURE SURGERY Left 1980's   Elbow  . Left arm surgery    . Left leg surgery    . SHOULDER SURGERY     Right    There were no vitals filed for this visit.  Subjective Assessment - 08/08/17 1550    Subjective  1/10 pain,  Able to eat without pain    Currently in Pain?  Yes  Pain Score  1     Pain Location  Shoulder    Pain Orientation  Right    Pain Type  Acute pain    Aggravating Factors   stretcging,   using    Pain Relieving Factors  rest meds    Effect of Pain on Daily Activities  Not back to all ADL'd    Difficult with reach back of head and behind back    Multiple Pain Sites  -- knees 5/10 left,  4/10 right ( not new)         [facility] PT Assessment - 08/08/17 0001      AROM   Right Shoulder Flexion  140 Degrees                  [facility] Adult PT Treatment/Exercise - 08/08/17 0001      Shoulder Exercises: Seated   Flexion  10 reps AA then patient lifted off hand to increase AROM/strength    Other Seated Exercises  fists on head.  Moving elbows in and out 10 X      Shoulder Exercises: Prone   Extension  10 reps 6 and 10 LBS,  also ext 10 x 2 6 LBS      Shoulder  Exercises: Standing   External Rotation  Strengthening;Right;15 reps blue band    Extension  Strengthening 15    Row  Strengthening;Right;15 reps;Theraband    Theraband Level (Shoulder Row)  Level 4 (Blue)    Other Standing Exercises  self mobe with towel roll and pillowcase pull inti adduction and inferior for IR increase,  Improved 1/2 inch.      Shoulder Exercises: Pulleys   Flexion  2 minutes      Moist Heat Therapy   Number Minutes Moist Heat  10 Minutes    Moist Heat Location  Shoulder      Manual Therapy   Manual therapy comments  joint oscillation, distraction     Joint Mobilization  inf glides Gr II seated arm propped     Passive ROM  all planes to tolerance                PT Short Term Goals - 08/08/17 1740      PT SHORT TERM GOAL #1   Title  Pt will be I with initial HEP for Rt. UE A/AROM and strength     Time  4    Period  Weeks    Status  Achieved      PT SHORT TERM GOAL #2   Title  Pt will be able to lift Rt. arm for eating without use of Lt UE to assist.     Baseline  able to eat right no issues    Time  4    Period  Weeks    Status  Achieved      PT SHORT TERM GOAL #3   Title  Pt will be able to walk with bilateral arms extended and normalized arm swing when cued     Time  4    Status  Achieved        PT Long Term Goals - 08/01/17 1619      PT LONG TERM GOAL #1   Title  Pt will improve FOTO score to less than 50% limited.     Baseline  56% limited today    Status  On-going      PT LONG TERM GOAL #2   Title  Pt will  be able to eat with Rt UE without limitation of pain.     Status  On-going      PT LONG TERM GOAL #3   Title  Pt will be able to raise Rt. arm to 120 degrees to retrieve items from cabinet.     Baseline  93 degrees today    Status  On-going      PT LONG TERM GOAL #4   Title  Pt will demo Rt. UE strength to at least 3+/5 for increased ability to carry, lift items safely.     Baseline  still 3-/5 with flexion , abduction  ,ER    Status  On-going      PT LONG TERM GOAL #5   Title  Pt will be I with HEP as of last visit     Status  On-going       GCODE: (carrying, moving objects) Current CK Goal CJ     Plan - 08/08/17 1738    Clinical Impression Statement  140 AROM flexion.  Patient saw MD on Tuesday.  He said to [place] [facility].  Shoulder is healing , he does not have to go back to MD unless there is a problem. STG#2 met.   No pain increased with session.    PT Next Visit Plan  Progress ROm and strength, manual, heat   FOTO??    PT Home Exercise Plan  Table slides for AAROM , isometrics (DC), row and ER, flexion     Consulted and Agree with Plan of Care  Patient       Patient will benefit from skilled therapeutic intervention in order to improve the following deficits and impairments:     Visit Diagnosis: Acute pain of right shoulder  Stiffness of right elbow, not elsewhere classified  Stiffness of right shoulder, not elsewhere classified  Muscle weakness (generalized)  Localized edema     Problem List Patient Active Problem List   Diagnosis Date Noted  . Right humeral fracture 05/24/2017  . Osteoporosis 05/14/2017  . Fall 05/13/2017  . Secondary male hypogonadism 02/07/2017  . Hypomagnesemia 12/28/2016  . Long-term current use of opiate analgesic 09/07/2016  . Mild carpal tunnel syndrome of right wrist 01/24/2016  . C6 radiculopathy 01/24/2016  . Paroxysmal atrial fibrillation ([place]) 10/25/2015  . Alcohol abuse 02/28/2015  . Constipation due to opioid therapy 12/24/2014  . Venous stasis ulcer of left lower extremity ([place]) 08/06/2014  . Encounter for therapeutic drug monitoring 11/16/2013  . Diverticulosis 11/12/2013  . Post-traumatic osteoarthritis of left knee 06/19/2013  . Obstructive sleep apnea 06/01/2013  . Osteoarthritis cervical spine 04/25/2013  . Gastroesophageal reflux disease without esophagitis 04/25/2013  . Vasomotor rhinitis 04/25/2013  . Open-angle glaucoma  04/25/2013  . Hyperlipidemia 04/25/2013  . 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 ([place]) 04/25/2013  . Coronary artery disease involving native coronary artery with angina pectoris ([place]) 04/25/2013  . Chronic asthmatic bronchitis ([place]) 04/25/2013  . Idiopathic chronic gout without tophus 04/25/2013  . Severe obesity with body mass index (BMI) of 35.0 to 39.9 with comorbidity ([place]) 04/25/2013  . Right rotator cuff tear 04/25/2013  . Healthcare maintenance 01/15/2013  . Chronic pain syndrome 01/15/2013  . Chronic diastolic heart failure ([place]) 02/04/2012  . Essential hypertension 09/20/2011    HARRIS,KAREN  PTA 08/08/2017, 5:42 PM  [facility] [address] [place], [place], [address] Phone: [phone]   Fax:  [phone]  Name: Juan Calderon MRN: [id number] Date  of Birth: [birth year]   [place], PT 09/16/17 8:05 AM Phone: [phone] Fax: [phone]

## 2017-08-13 ENCOUNTER — Ambulatory Visit: Payer: Medicare Other

## 2017-08-13 DIAGNOSIS — M25511 Pain in right shoulder: Secondary | ICD-10-CM

## 2017-08-13 DIAGNOSIS — M25621 Stiffness of right elbow, not elsewhere classified: Secondary | ICD-10-CM | POA: Diagnosis not present

## 2017-08-13 DIAGNOSIS — M6281 Muscle weakness (generalized): Secondary | ICD-10-CM | POA: Diagnosis not present

## 2017-08-13 DIAGNOSIS — M25611 Stiffness of right shoulder, not elsewhere classified: Secondary | ICD-10-CM | POA: Diagnosis not present

## 2017-08-13 DIAGNOSIS — R600 Localized edema: Secondary | ICD-10-CM | POA: Diagnosis not present

## 2017-08-13 NOTE — Therapy (Signed)
[place], [place], [address] Phone: [phone]   Fax:  [phone]  Physical Therapy Treatment  Patient Details  Name: Juan Calderon MRN: [id number] Date of Birth: 01/31/1956 Referring Provider: Dr. Oval Linsey   Encounter Date: 08/13/2017  PT End of Session - 08/13/17 1556    Visit Number  11    Number of Visits  16    Date for PT Re-Evaluation  08/29/17    PT Start Time  0350    PT Stop Time  0430    PT Time Calculation (min)  40 min    Activity Tolerance  Patient tolerated treatment well    Behavior During Therapy  [facility] for tasks assessed/performed       Past Medical History:  Diagnosis Date  . Alcohol abuse    . C6 radiculopathy 01/24/2016   Right upper extremity, mild to moderate electrically by EMG on 01/24/2016  . Cataract    Left eye  . Chronic diastolic heart failure ([place])     with mild left ventricular hypertrophy on Echo 02/2010  . Chronic obstructive pulmonary disease ([place])    . Chronic osteomyelitis of femur ([place]) 04/06/2016  . Chronic pain syndrome     Left arm and leg s/p traumatic injury   . Chronic renal insufficiency    . Coronary artery disease     25% LAD stenosis on cath 2007.  Stable angina.  . Diverticulosis    . Essential hypertension    . Gastroesophageal reflux disease    . Gout    . Hyperlipidemia LDL goal < 100    . Long-term current use of opiate analgesic 09/07/2016  . Mild carpal tunnel syndrome of right wrist 01/24/2016   Mild degree electrically per EMG 01/24/2016   . Morbid obesity with BMI of 40.0-44.9, adult ([place])    . Normocytic anemia    . Obstructive sleep apnea     Moderate, AHI 29.8 per hour with moderately loud snoring and oxygen desaturation to a nadir of 79%. CPAP titration resulted in a prescription for 17 CWP.    [name] Open-angle glaucoma    . Osteoarthritis cervical spine    . Osteoarthritis of left knee 06/19/2013   Tricompartmental disease.   Treated with double hinged upright knee brace, steroid/xylocaine knee injections, and NSAIDs   . Osteoporosis 05/14/2017   s/p fracture of the right humerus from a fall at ground hight  . Paroxysmal atrial fibrillation ([place]) 10/25/2015  . Persistent atrial fibrillation ([place])    . Right rotator cuff tear     Large full-thickness tear of the supraspinatus with mild retraction but no atrophy   . Secondary male hypogonadism 02/07/2017   Likely secondary to chronic opioid use  . Subclinical hypothyroidism    . 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 [facility])      Past Surgical History:  Procedure Laterality Date  . CARDIOVERSION N/A 12/30/2014   Procedure: CARDIOVERSION;  Surgeon: [name], MD;  Location: [facility] ENDOSCOPY;  Service: Cardiovascular;  Laterality: N/A;  . FRACTURE SURGERY Left 1980's   Elbow  . Left arm surgery    . Left leg surgery    . SHOULDER SURGERY     Right    There were no vitals filed for this visit.  Subjective Assessment - 08/13/17 1551    Subjective  No pain today    Currently in Pain?  No/denies  [facility] PT Assessment - 08/13/17 0001      AROM   Right Shoulder Flexion  110 Degrees passive 120 degrees                  [facility] Adult PT Treatment/Exercise - 08/13/17 0001      Shoulder Exercises: Prone   Extension  Right;15 reps 8 pounds     Horizontal ABduction 1  15 reps;Right 3 pounds    Other Prone Exercises  row 15 pounds x 15 reps      Shoulder Exercises: Standing   Other Standing Exercises  reaching to shelves with 3 poounds x 10 to top shelf and 5 pounds to second shelf and  6 pounds to second shelf x 10  Then with double green band flex/IR /extension/ER x 15  with ER most difficut   then standing with 3  pounds abduction x 15  t      Shoulder Exercises: Pulleys   Flexion  2 minutes      Shoulder Exercises: ROM/Strengthening   UBE (Upper Arm Bike)  6 min level 1 ,reverse 3 min forward 3 min       Shoulder Exercises: Body Blade   ABduction  30 seconds;2 reps    External Rotation  30 seconds;2 reps    External Rotation Limitations  dne 1 set in middle and onset one end on floor      Manual Therapy   Passive ROM  behind back x 20 sec x 2 and disussed getting assist at home if he can trust helper to not pull hard but this will get more distance/range  than he can do on own.                PT Short Term Goals - 08/08/17 1740      PT SHORT TERM GOAL #1   Title  Pt will be I with initial HEP for Rt. UE A/AROM and strength     Time  4    Period  Weeks    Status  Achieved      PT SHORT TERM GOAL #2   Title  Pt will be able to lift Rt. arm for eating without use of Lt UE to assist.     Baseline  able to eat right no issues    Time  4    Period  Weeks    Status  Achieved      PT SHORT TERM GOAL #3   Title  Pt will be able to walk with bilateral arms extended and normalized arm swing when cued     Time  4    Status  Achieved        PT Long Term Goals - 08/13/17 1630      PT LONG TERM GOAL #1   Title  Pt will improve FOTO score to less than 50% limited.     Status  On-going      PT LONG TERM GOAL #2   Title  Pt will be able to eat with Rt UE without limitation of pain.     Status  Achieved      PT LONG TERM GOAL #3   Title  Pt will be able to raise Rt. arm to 120 degrees to retrieve items from cabinet.     Baseline  110 degrees today    Status  On-going      PT LONG TERM GOAL #4   Title  Pt will demo Rt.  UE strength to at least 3+/5 for increased ability to carry, lift items safely.     Baseline  3+/5 today or better    Status  Achieved      PT LONG TERM GOAL #5   Title  Pt will be I with HEP as of last visit     Status  On-going            Plan - 08/13/17 1556    Clinical Impression Statement  110 degrees active flexion. May have been miss measure last visit. PAssive only to 120.   Pain minimal or not at all generally. continue with stiffness ande  weakness . Stiffness may be long term.     PT Treatment/Interventions  ADLs/Self Care Home Management;Patient/family education;Taping;Cryotherapy;Functional mobility training;Manual techniques;Therapeutic activities;Therapeutic exercise;Ultrasound;Neuromuscular re-education;Passive range of motion    PT Next Visit Plan  Progress ROm and strength, manual, heat       PT Home Exercise Plan  Table slides for AAROM , isometrics (DC), row and ER, flexion     Consulted and Agree with Plan of Care  Patient       Patient will benefit from skilled therapeutic intervention in order to improve the following deficits and impairments:  Hypomobility, Impaired sensation, Increased edema, Decreased strength, Increased fascial restricitons, Pain, Impaired UE functional use, Obesity, Decreased mobility, Decreased range of motion, Impaired flexibility, Postural dysfunction  Visit Diagnosis: Acute pain of right shoulder  Stiffness of right elbow, not elsewhere classified  Stiffness of right shoulder, not elsewhere classified  Muscle weakness (generalized)  Localized edema     Problem List Patient Active Problem List   Diagnosis Date Noted  . Right humeral fracture 05/24/2017  . Osteoporosis 05/14/2017  . Fall 05/13/2017  . Secondary male hypogonadism 02/07/2017  . Hypomagnesemia 12/28/2016  . Long-term current use of opiate analgesic 09/07/2016  . Mild carpal tunnel syndrome of right wrist 01/24/2016  . C6 radiculopathy 01/24/2016  . Paroxysmal atrial fibrillation ([place]) 10/25/2015  . Alcohol abuse 02/28/2015  . Constipation due to opioid therapy 12/24/2014  . Venous stasis ulcer of left lower extremity ([place]) 08/06/2014  . Encounter for therapeutic drug monitoring 11/16/2013  . Diverticulosis 11/12/2013  . Post-traumatic osteoarthritis of left knee 06/19/2013  . Obstructive sleep apnea 06/01/2013  . Osteoarthritis cervical spine 04/25/2013  . Gastroesophageal reflux disease without esophagitis  04/25/2013  . Vasomotor rhinitis 04/25/2013  . Open-angle glaucoma 04/25/2013  . Hyperlipidemia 04/25/2013  . 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 ([place]) 04/25/2013  . Coronary artery disease involving native coronary artery with angina pectoris ([place]) 04/25/2013  . Chronic asthmatic bronchitis ([place]) 04/25/2013  . Idiopathic chronic gout without tophus 04/25/2013  . Severe obesity with body mass index (BMI) of 35.0 to 39.9 with comorbidity ([place]) 04/25/2013  . Right rotator cuff tear 04/25/2013  . Healthcare maintenance 01/15/2013  . Chronic pain syndrome 01/15/2013  . Chronic diastolic heart failure ([place]) 02/04/2012  . Essential hypertension 09/20/2011    Darrel Hoover PT 08/13/2017, 4:32 PM  [facility] [address] [place], [place], [address] Phone: [phone]   Fax:  [phone]  Name: ROLONDO PIERRE MRN: [id number] Date of Birth: [birth year]

## 2017-08-15 ENCOUNTER — Ambulatory Visit: Payer: Medicare Other

## 2017-08-15 DIAGNOSIS — R600 Localized edema: Secondary | ICD-10-CM | POA: Diagnosis not present

## 2017-08-15 DIAGNOSIS — M6281 Muscle weakness (generalized): Secondary | ICD-10-CM | POA: Diagnosis not present

## 2017-08-15 DIAGNOSIS — M25511 Pain in right shoulder: Secondary | ICD-10-CM

## 2017-08-15 DIAGNOSIS — M25621 Stiffness of right elbow, not elsewhere classified: Secondary | ICD-10-CM

## 2017-08-15 DIAGNOSIS — M25611 Stiffness of right shoulder, not elsewhere classified: Secondary | ICD-10-CM

## 2017-08-15 NOTE — Therapy (Signed)
[place], [place], [address] Phone: [phone]   Fax:  [phone]  Physical Therapy Treatment  Patient Details  Name: Juan Calderon MRN: [id number] Date of Birth: 21-Dec-1955 Referring Provider: Dr. Oval Linsey   Encounter Date: 08/15/2017  PT End of Session - 08/15/17 1556    Visit Number  12    Number of Visits  16    Date for PT Re-Evaluation  08/29/17    PT Start Time  0355 late 10 min    PT Stop Time  0440    PT Time Calculation (min)  45 min    Activity Tolerance  Patient tolerated treatment well    Behavior During Therapy  [facility] for tasks assessed/performed       Past Medical History:  Diagnosis Date  . Alcohol abuse    . C6 radiculopathy 01/24/2016   Right upper extremity, mild to moderate electrically by EMG on 01/24/2016  . Cataract    Left eye  . Chronic diastolic heart failure ([place])     with mild left ventricular hypertrophy on Echo 02/2010  . Chronic obstructive pulmonary disease ([place])    . Chronic osteomyelitis of femur ([place]) 04/06/2016  . Chronic pain syndrome     Left arm and leg s/p traumatic injury   . Chronic renal insufficiency    . Coronary artery disease     25% LAD stenosis on cath 2007.  Stable angina.  . Diverticulosis    . Essential hypertension    . Gastroesophageal reflux disease    . Gout    . Hyperlipidemia LDL goal < 100    . Long-term current use of opiate analgesic 09/07/2016  . Mild carpal tunnel syndrome of right wrist 01/24/2016   Mild degree electrically per EMG 01/24/2016   . Morbid obesity with BMI of 40.0-44.9, adult ([place])    . Normocytic anemia    . Obstructive sleep apnea     Moderate, AHI 29.8 per hour with moderately loud snoring and oxygen desaturation to a nadir of 79%. CPAP titration resulted in a prescription for 17 CWP.    [name] Open-angle glaucoma    . Osteoarthritis cervical spine    . Osteoarthritis of left knee 06/19/2013   Tricompartmental  disease.  Treated with double hinged upright knee brace, steroid/xylocaine knee injections, and NSAIDs   . Osteoporosis 05/14/2017   s/p fracture of the right humerus from a fall at ground hight  . Paroxysmal atrial fibrillation ([place]) 10/25/2015  . Persistent atrial fibrillation ([place])    . Right rotator cuff tear     Large full-thickness tear of the supraspinatus with mild retraction but no atrophy   . Secondary male hypogonadism 02/07/2017   Likely secondary to chronic opioid use  . Subclinical hypothyroidism    . 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 [facility])      Past Surgical History:  Procedure Laterality Date  . CARDIOVERSION N/A 12/30/2014   Procedure: CARDIOVERSION;  Surgeon: [name], MD;  Location: [facility] ENDOSCOPY;  Service: Cardiovascular;  Laterality: N/A;  . FRACTURE SURGERY Left 1980's   Elbow  . Left arm surgery    . Left leg surgery    . SHOULDER SURGERY     Right    There were no vitals filed for this visit.  Subjective Assessment - 08/15/17 1630    Subjective  No pain today. Arm still stiff and weak. Sore after exercise.    Currently  in Pain?  No/denies                      [facility] Adult PT Treatment/Exercise - 08/15/17 0001      Shoulder Exercises: Standing   Other Standing Exercises  blueand green band [place] x 20 and bilateral abduction behind back in green band loop.  3-4 inches max range.     Other Standing Exercises  2-3-4 pounds weight RT sitting with elbow flex the overhead as able x 10 reps  then asssited overhead reach 5 sets of five wuth end range stretch and cues to elevate and rotate scapula to increase range overhea then active reach behind bacwich after stretching was 1-2 inches from LT max range. Also assisted behind back with mob with movement with GH distraction and inf tractionk d       Shoulder Exercises: Pulleys   Flexion  2 minutes      Shoulder Exercises: ROM/Strengthening   UBE (Upper Arm  Bike)  5 min forward L1      Moist Heat Therapy   Number Minutes Moist Heat  10 Minutes    Moist Heat Location  Shoulder               PT Short Term Goals - 08/15/17 1627      PT SHORT TERM GOAL #1   Title  Pt will be I with initial HEP for Rt. UE A/AROM and strength     Status  Achieved      PT SHORT TERM GOAL #2   Title  Pt will be able to lift Rt. arm for eating without use of Lt UE to assist.     Status  Achieved      PT SHORT TERM GOAL #3   Title  Pt will be able to walk with bilateral arms extended and normalized arm swing when cued     Status  Achieved        PT Long Term Goals - 08/15/17 1627      PT LONG TERM GOAL #1   Title  Pt will improve FOTO score to less than 50% limited.     Status  Unable to assess      PT LONG TERM GOAL #2   Title  Pt will be able to eat with Rt UE without limitation of pain.     Status  Achieved      PT LONG TERM GOAL #3   Title  Pt will be able to raise Rt. arm to 120 degrees to retrieve items from cabinet.     Baseline  110 degrees today    Status  On-going      PT LONG TERM GOAL #4   Title  Pt will demo Rt. UE strength to at least 3+/5 for increased ability to carry, lift items safely.     Baseline  3+/5 today or better below shoulder height    Status  Achieved      PT LONG TERM GOAL #5   Title  Pt will be I with HEP as of last visit     Status  On-going            Plan - 08/15/17 1557    Clinical Impression Statement  Decreased sccapula strength limits overhead ROM though GH joint stiffness a major limiting factor. Continues with weakess  limited to 2-3 pounds with ROM exercises limiting ROm    PT Treatment/Interventions  ADLs/Self Care Home Management;Patient/family education;Taping;Cryotherapy;Functional  mobility training;Manual techniques;Therapeutic activities;Therapeutic exercise;Ultrasound;Neuromuscular re-education;Passive range of motion    PT Next Visit Plan  Progress ROm and strength, manual, heat    add some band exercises, scapula strength  prone for home    PT Home Exercise Plan  Table slides for AAROM , isometrics (DC), row and ER, flexion     Consulted and Agree with Plan of Care  Patient       Patient will benefit from skilled therapeutic intervention in order to improve the following deficits and impairments:  Hypomobility, Impaired sensation, Increased edema, Decreased strength, Increased fascial restricitons, Pain, Impaired UE functional use, Obesity, Decreased mobility, Decreased range of motion, Impaired flexibility, Postural dysfunction  Visit Diagnosis: Acute pain of right shoulder  Stiffness of right elbow, not elsewhere classified  Stiffness of right shoulder, not elsewhere classified  Muscle weakness (generalized)  Localized edema     Problem List Patient Active Problem List   Diagnosis Date Noted  . Right humeral fracture 05/24/2017  . Osteoporosis 05/14/2017  . Fall 05/13/2017  . Secondary male hypogonadism 02/07/2017  . Hypomagnesemia 12/28/2016  . Long-term current use of opiate analgesic 09/07/2016  . Mild carpal tunnel syndrome of right wrist 01/24/2016  . C6 radiculopathy 01/24/2016  . Paroxysmal atrial fibrillation ([place]) 10/25/2015  . Alcohol abuse 02/28/2015  . Constipation due to opioid therapy 12/24/2014  . Venous stasis ulcer of left lower extremity ([place]) 08/06/2014  . Encounter for therapeutic drug monitoring 11/16/2013  . Diverticulosis 11/12/2013  . Post-traumatic osteoarthritis of left knee 06/19/2013  . Obstructive sleep apnea 06/01/2013  . Osteoarthritis cervical spine 04/25/2013  . Gastroesophageal reflux disease without esophagitis 04/25/2013  . Vasomotor rhinitis 04/25/2013  . Open-angle glaucoma 04/25/2013  . Hyperlipidemia 04/25/2013  . 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 ([place]) 04/25/2013  . Coronary artery disease involving native coronary artery with angina pectoris ([place]) 04/25/2013  .  Chronic asthmatic bronchitis ([place]) 04/25/2013  . Idiopathic chronic gout without tophus 04/25/2013  . Severe obesity with body mass index (BMI) of 35.0 to 39.9 with comorbidity ([place]) 04/25/2013  . Right rotator cuff tear 04/25/2013  . Healthcare maintenance 01/15/2013  . Chronic pain syndrome 01/15/2013  . Chronic diastolic heart failure ([place]) 02/04/2012  . Essential hypertension 09/20/2011    Darrel Hoover  PT 08/15/2017, 4:31 PM  [facility] [address] [place], [place], [address] Phone: [phone]   Fax:  [phone]  Name: CALLEN VANCUREN MRN: [id number] Date of Birth: [birth year]

## 2017-08-20 ENCOUNTER — Ambulatory Visit: Payer: Medicare Other | Admitting: Physical Therapy

## 2017-08-20 ENCOUNTER — Encounter: Payer: Self-pay | Admitting: Physical Therapy

## 2017-08-20 ENCOUNTER — Ambulatory Visit (INDEPENDENT_AMBULATORY_CARE_PROVIDER_SITE_OTHER): Payer: Medicare Other | Admitting: *Deleted

## 2017-08-20 DIAGNOSIS — M25621 Stiffness of right elbow, not elsewhere classified: Secondary | ICD-10-CM | POA: Diagnosis not present

## 2017-08-20 DIAGNOSIS — Z5181 Encounter for therapeutic drug level monitoring: Secondary | ICD-10-CM | POA: Diagnosis not present

## 2017-08-20 DIAGNOSIS — R600 Localized edema: Secondary | ICD-10-CM | POA: Diagnosis not present

## 2017-08-20 DIAGNOSIS — M6281 Muscle weakness (generalized): Secondary | ICD-10-CM

## 2017-08-20 DIAGNOSIS — M25611 Stiffness of right shoulder, not elsewhere classified: Secondary | ICD-10-CM | POA: Diagnosis not present

## 2017-08-20 DIAGNOSIS — M25511 Pain in right shoulder: Secondary | ICD-10-CM | POA: Diagnosis not present

## 2017-08-20 DIAGNOSIS — I480 Paroxysmal atrial fibrillation: Secondary | ICD-10-CM | POA: Diagnosis not present

## 2017-08-20 LAB — POCT INR: INR: 1.8

## 2017-08-20 NOTE — Therapy (Signed)
[place], [place], [address] Phone: [phone]   Fax:  [phone]  Physical Therapy Treatment  Patient Details  Name: Juan Calderon MRN: [id number] Date of Birth: March 15, 1956 Referring Provider: Dr. Oval Linsey   Encounter Date: 08/20/2017  PT End of Session - 08/20/17 1543    Visit Number  13    Number of Visits  16    Date for PT Re-Evaluation  08/29/17    PT Start Time  1503    PT Stop Time  1555    PT Time Calculation (min)  52 min    Behavior During Therapy  [facility] for tasks assessed/performed       Past Medical History:  Diagnosis Date  . Alcohol abuse    . C6 radiculopathy 01/24/2016   Right upper extremity, mild to moderate electrically by EMG on 01/24/2016  . Cataract    Left eye  . Chronic diastolic heart failure ([place])     with mild left ventricular hypertrophy on Echo 02/2010  . Chronic obstructive pulmonary disease ([place])    . Chronic osteomyelitis of femur ([place]) 04/06/2016  . Chronic pain syndrome     Left arm and leg s/p traumatic injury   . Chronic renal insufficiency    . Coronary artery disease     25% LAD stenosis on cath 2007.  Stable angina.  . Diverticulosis    . Essential hypertension    . Gastroesophageal reflux disease    . Gout    . Hyperlipidemia LDL goal < 100    . Long-term current use of opiate analgesic 09/07/2016  . Mild carpal tunnel syndrome of right wrist 01/24/2016   Mild degree electrically per EMG 01/24/2016   . Morbid obesity with BMI of 40.0-44.9, adult ([place])    . Normocytic anemia    . Obstructive sleep apnea     Moderate, AHI 29.8 per hour with moderately loud snoring and oxygen desaturation to a nadir of 79%. CPAP titration resulted in a prescription for 17 CWP.    [name] Open-angle glaucoma    . Osteoarthritis cervical spine    . Osteoarthritis of left knee 06/19/2013   Tricompartmental disease.  Treated with double hinged upright knee brace,  steroid/xylocaine knee injections, and NSAIDs   . Osteoporosis 05/14/2017   s/p fracture of the right humerus from a fall at ground hight  . Paroxysmal atrial fibrillation ([place]) 10/25/2015  . Persistent atrial fibrillation ([place])    . Right rotator cuff tear     Large full-thickness tear of the supraspinatus with mild retraction but no atrophy   . Secondary male hypogonadism 02/07/2017   Likely secondary to chronic opioid use  . Subclinical hypothyroidism    . 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 [facility])      Past Surgical History:  Procedure Laterality Date  . CARDIOVERSION N/A 12/30/2014   Procedure: CARDIOVERSION;  Surgeon: [name], MD;  Location: [facility] ENDOSCOPY;  Service: Cardiovascular;  Laterality: N/A;  . FRACTURE SURGERY Left 1980's   Elbow  . Left arm surgery    . Left leg surgery    . SHOULDER SURGERY     Right    There were no vitals filed for this visit.  Subjective Assessment - 08/20/17 1504    Subjective  No pain in arm today.  Has been using arm to reach glasses in cabinet and other things.     Currently in Pain?  No/denies  Pain Score  0-No pain mild pain with things too heavy.     Pain Location  Shoulder    Pain Orientation  Right    Pain Frequency  Intermittent    Aggravating Factors   lifting heavier things like a bag of groceries    Pain Relieving Factors  rest. meds    Effect of Pain on Daily Activities  Not back to reaching behind head and back ,  not doing heavier lifting     Multiple Pain Sites  -- knee pain left 6/10.  Not new                      [facility] Adult PT Treatment/Exercise - 08/20/17 0001      Shoulder Exercises: Supine   Other Supine Exercises  supine scapular stabilization,  all practiced withh cues and yellow band   10 x each      Shoulder Exercises: Prone   Other Prone Exercises  I, Y  with 2 thumb positoins  HEP.  leaning over high table,  single arm      Shoulder Exercises: Standing    Internal Rotation  10 reps CANE    Extension  10 reps CANE    Other Standing Exercises  WALL PUSH UPS 10 X    Other Standing Exercises  2,3,4, lbs BENT ELBOW SHOULDER FLEXION   10 x.  SOME COMPENSATIONS NOTED      Shoulder Exercises: Pulleys   Flexion  2 minutes      Shoulder Exercises: ROM/Strengthening   UBE (Upper Arm Bike)  3 min forward L1,  3 in reverse      Moist Heat Therapy   Number Minutes Moist Heat  10 Minutes    Moist Heat Location  Shoulder             PT Education - 08/20/17 1523    Education provided  Yes    Education Details  hep    Person(s) Educated  Patient    Methods  Demonstration;Tactile cues;Verbal cues;Handout    Comprehension  Verbalized understanding;Returned demonstration       PT Short Term Goals - 08/15/17 1627      PT SHORT TERM GOAL #1   Title  Pt will be I with initial HEP for Rt. UE A/AROM and strength     Status  Achieved      PT SHORT TERM GOAL #2   Title  Pt will be able to lift Rt. arm for eating without use of Lt UE to assist.     Status  Achieved      PT SHORT TERM GOAL #3   Title  Pt will be able to walk with bilateral arms extended and normalized arm swing when cued     Status  Achieved        PT Long Term Goals - 08/20/17 1546      PT LONG TERM GOAL #1   Title  Pt will improve FOTO score to less than 50% limited.     Time  8    Period  Weeks    Status  Unable to assess      PT LONG TERM GOAL #2   Title  Pt will be able to eat with Rt UE without limitation of pain.     Time  8    Status  Achieved      PT LONG TERM GOAL #3   Title  Pt will be able to  raise Rt. arm to 120 degrees to retrieve items from cabinet.     Baseline  110 degrees today    Time  8    Status  On-going      PT LONG TERM GOAL #4   Title  Pt will demo Rt. UE strength to at least 3+/5 for increased ability to carry, lift items safely.     Time  8    Period  Weeks    Status  Achieved      PT LONG TERM GOAL #5   Title  Pt will be I  with HEP as of last visit     Baseline  continue to build HEP    Time  8    Period  Weeks    Status  On-going            Plan - 08/20/17 1544    Clinical Impression Statement  Pain 2/10 at end of session.  Patient able to progress his hEP.[name]  Compensations continue with GH stiffness .[name] ROM about the same.  He is able to lift 4 LBS overhead.        Patient will benefit from skilled therapeutic intervention in order to improve the following deficits and impairments:     Visit Diagnosis: Acute pain of right shoulder  Stiffness of right elbow, not elsewhere classified  Stiffness of right shoulder, not elsewhere classified  Muscle weakness (generalized)     Problem List Patient Active Problem List   Diagnosis Date Noted  . Right humeral fracture 05/24/2017  . Osteoporosis 05/14/2017  . Fall 05/13/2017  . Secondary male hypogonadism 02/07/2017  . Hypomagnesemia 12/28/2016  . Long-term current use of opiate analgesic 09/07/2016  . Mild carpal tunnel syndrome of right wrist 01/24/2016  . C6 radiculopathy 01/24/2016  . Paroxysmal atrial fibrillation ([place]) 10/25/2015  . Alcohol abuse 02/28/2015  . Constipation due to opioid therapy 12/24/2014  . Venous stasis ulcer of left lower extremity ([place]) 08/06/2014  . Encounter for therapeutic drug monitoring 11/16/2013  . Diverticulosis 11/12/2013  . Post-traumatic osteoarthritis of left knee 06/19/2013  . Obstructive sleep apnea 06/01/2013  . Osteoarthritis cervical spine 04/25/2013  . Gastroesophageal reflux disease without esophagitis 04/25/2013  . Vasomotor rhinitis 04/25/2013  . Open-angle glaucoma 04/25/2013  . Hyperlipidemia 04/25/2013  . 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 ([place]) 04/25/2013  . Coronary artery disease involving native coronary artery with angina pectoris ([place]) 04/25/2013  . Chronic asthmatic bronchitis ([place]) 04/25/2013  . Idiopathic chronic gout without tophus 04/25/2013   . Severe obesity with body mass index (BMI) of 35.0 to 39.9 with comorbidity ([place]) 04/25/2013  . Right rotator cuff tear 04/25/2013  . Healthcare maintenance 01/15/2013  . Chronic pain syndrome 01/15/2013  . Chronic diastolic heart failure ([place]) 02/04/2012  . Essential hypertension 09/20/2011    Dorsie Burich PTA 08/20/2017, 6:06 PM  [facility] [address] [place], [place], [address] Phone: [phone]   Fax:  [phone]  Name: Juan Calderon MRN: [id number] Date of Birth: Nov 05, 1955

## 2017-08-20 NOTE — Patient Instructions (Addendum)
  Description   When you get home take 1/2 tablet (already taken today's dose) then continue taking 1 tablet everyday except 1/2 tablet on Mondays and Fridays. Recheck INR in 4 weeks with MD appt. Coumadin clinic [phone] with concerns.

## 2017-08-20 NOTE — Patient Instructions (Signed)
hUGHSTON SHOULDER ISSUED FROM ES DRAWER 1-2 X A DAY 10 X EACH hOLD 1 SECOND.

## 2017-08-22 ENCOUNTER — Ambulatory Visit: Payer: Medicare Other

## 2017-08-22 DIAGNOSIS — M25621 Stiffness of right elbow, not elsewhere classified: Secondary | ICD-10-CM | POA: Diagnosis not present

## 2017-08-22 DIAGNOSIS — R600 Localized edema: Secondary | ICD-10-CM

## 2017-08-22 DIAGNOSIS — M25611 Stiffness of right shoulder, not elsewhere classified: Secondary | ICD-10-CM | POA: Diagnosis not present

## 2017-08-22 DIAGNOSIS — M6281 Muscle weakness (generalized): Secondary | ICD-10-CM | POA: Diagnosis not present

## 2017-08-22 DIAGNOSIS — M25511 Pain in right shoulder: Secondary | ICD-10-CM | POA: Diagnosis not present

## 2017-08-22 NOTE — Therapy (Addendum)
[facility] [place], [place], [address] Phone: [phone]   Fax:  [phone]  Physical Therapy Treatment/Discharge  Patient Details  Name: Juan Calderon MRN: [id number] Date of Birth: 17-Oct-1955 Referring Provider: Dr. Oval Linsey   Encounter Date: 08/22/2017  PT End of Session - 08/22/17 1606    Visit Number  14    Number of Visits  16    Date for PT Re-Evaluation  08/29/17    PT Start Time  0342    PT Stop Time  0415    PT Time Calculation (min)  33 min    Activity Tolerance  Patient tolerated treatment well    Behavior During Therapy  [facility] for tasks assessed/performed       Past Medical History:  Diagnosis Date  . Alcohol abuse    . C6 radiculopathy 01/24/2016   Right upper extremity, mild to moderate electrically by EMG on 01/24/2016  . Cataract    Left eye  . Chronic diastolic heart failure ([place])     with mild left ventricular hypertrophy on Echo 02/2010  . Chronic obstructive pulmonary disease ([place])    . Chronic osteomyelitis of femur ([place]) 04/06/2016  . Chronic pain syndrome     Left arm and leg s/p traumatic injury   . Chronic renal insufficiency    . Coronary artery disease     25% LAD stenosis on cath 2007.  Stable angina.  . Diverticulosis    . Essential hypertension    . Gastroesophageal reflux disease    . Gout    . Hyperlipidemia LDL goal < 100    . Long-term current use of opiate analgesic 09/07/2016  . Mild carpal tunnel syndrome of right wrist 01/24/2016   Mild degree electrically per EMG 01/24/2016   . Morbid obesity with BMI of 40.0-44.9, adult ([place])    . Normocytic anemia    . Obstructive sleep apnea     Moderate, AHI 29.8 per hour with moderately loud snoring and oxygen desaturation to a nadir of 79%. CPAP titration resulted in a prescription for 17 CWP.    [name] Open-angle glaucoma    . Osteoarthritis cervical spine    . Osteoarthritis of left knee 06/19/2013   Tricompartmental  disease.  Treated with double hinged upright knee brace, steroid/xylocaine knee injections, and NSAIDs   . Osteoporosis 05/14/2017   s/p fracture of the right humerus from a fall at ground hight  . Paroxysmal atrial fibrillation ([place]) 10/25/2015  . Persistent atrial fibrillation ([place])    . Right rotator cuff tear     Large full-thickness tear of the supraspinatus with mild retraction but no atrophy   . Secondary male hypogonadism 02/07/2017   Likely secondary to chronic opioid use  . Subclinical hypothyroidism    . 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 [facility])      Past Surgical History:  Procedure Laterality Date  . CARDIOVERSION N/A 12/30/2014   Procedure: CARDIOVERSION;  Surgeon: [name], MD;  Location: [facility] ENDOSCOPY;  Service: Cardiovascular;  Laterality: N/A;  . FRACTURE SURGERY Left 1980's   Elbow  . Left arm surgery    . Left leg surgery    . SHOULDER SURGERY     Right    There were no vitals filed for this visit.  Subjective Assessment - 08/22/17 1554    Subjective  No pain . no complaints    Currently in Pain?  No/denies  [facility] PT Assessment - 08/22/17 0001      AROM   Right Shoulder Extension  28 Degrees    Right Shoulder Flexion  120 Degrees    Right Shoulder ABduction  118 Degrees    Right Shoulder Internal Rotation  -- at 70 degree scaption  15 degrees, 60 arm at side    Right Shoulder External Rotation  50 Degrees 70 degrees abduction    Right Elbow Flexion  136    Right Elbow Extension  -15      PROM   Left Shoulder Flexion  135 Degrees    Left Shoulder ABduction  120 Degrees      Strength   Right Shoulder Flexion  4-/5    Right Shoulder ABduction  4/5    Right Shoulder Internal Rotation  4+/5    Right Shoulder External Rotation  4/5    Right Elbow Flexion  5/5    Right Elbow Extension  5/5                  [facility] Adult PT Treatment/Exercise - 08/22/17 0001      Shoulder Exercises: Prone    Other Prone Exercises  I, Y  with 2 thumb positoins       Shoulder Exercises: Standing   Internal Rotation  10 reps    Extension  10 reps    Other Standing Exercises  blue band for home with rockwood  Caautioned to use the band without pain if able and adjust band as neede dfor most ROM withautioned to       Shoulder Exercises: Pulleys   Flexion  2 minutes      Shoulder Exercises: ROM/Strengthening   UBE (Upper Arm Bike)               PT Short Term Goals - 08/15/17 1627      PT SHORT TERM GOAL #1   Title  Pt will be I with initial HEP for Rt. UE A/AROM and strength     Status  Achieved      PT SHORT TERM GOAL #2   Title  Pt will be able to lift Rt. arm for eating without use of Lt UE to assist.     Status  Achieved      PT SHORT TERM GOAL #3   Title  Pt will be able to walk with bilateral arms extended and normalized arm swing when cued     Status  Achieved        PT Long Term Goals - 08/22/17 1624      PT LONG TERM GOAL #1   Title  Pt will improve FOTO score to less than 50% limited.     Baseline  tried to do but systemn timed out and not able to do another. 56% at last time    Status  Not Met      PT LONG TERM GOAL #2   Title  Pt will be able to eat with Rt UE without limitation of pain.     Status  Achieved      PT LONG TERM GOAL #4   Title  Pt will demo Rt. UE strength to at least 3+/5 for increased ability to carry, lift items safely.     Baseline  3+/5 today or better below shoulder height   (80 degrees flexion   and abduction)     Status  Achieved      PT LONG TERM GOAL #5     Title  Pt will be I with HEP as of last visit     Status  Achieved       G CODES: Carrying, moving objects Current: CJ Goal CJ Discharge [place] - 08/22/17 1621    Clinical Impression Statement  Juan Calderon reports he is ready for dischar doing HEP daily at home. He reports he has some swelling though this is bette.   He feels he can progress at home with bands and  working on lifting. He reports he had stiffness and some weakness before from RTC repair.       PT Treatment/Interventions  ADLs/Self Care Home Management;Patient/family education;Taping;Cryotherapy;Functional mobility training;Manual techniques;Therapeutic activities;Therapeutic exercise;Ultrasound;Neuromuscular re-education;Passive range of motion    PT Next Visit Plan  Discharge this visit    PT Home Exercise Plan  Table slides for AAROM , isometrics (DC), row and ER, flexion Band exer rotation and flex ext    Consulted and Agree with Plan of Care  Patient       Patient will benefit from skilled therapeutic intervention in order to improve the following deficits and impairments:  Hypomobility, Impaired sensation, Increased edema, Decreased strength, Increased fascial restricitons, Pain, Impaired UE functional use, Obesity, Decreased mobility, Decreased range of motion, Impaired flexibility, Postural dysfunction  Visit Diagnosis: Acute pain of right shoulder  Stiffness of right elbow, not elsewhere classified  Stiffness of right shoulder, not elsewhere classified  Muscle weakness (generalized)  Localized edema     Problem List Patient Active Problem List   Diagnosis Date Noted  . Right humeral fracture 05/24/2017  . Osteoporosis 05/14/2017  . Fall 05/13/2017  . Secondary male hypogonadism 02/07/2017  . Hypomagnesemia 12/28/2016  . Long-term current use of opiate analgesic 09/07/2016  . Mild carpal tunnel syndrome of right wrist 01/24/2016  . C6 radiculopathy 01/24/2016  . Paroxysmal atrial fibrillation ([place]) 10/25/2015  . Alcohol abuse 02/28/2015  . Constipation due to opioid therapy 12/24/2014  . Venous stasis ulcer of left lower extremity ([place]) 08/06/2014  . Encounter for therapeutic drug monitoring 11/16/2013  . Diverticulosis 11/12/2013  . Post-traumatic osteoarthritis of left knee 06/19/2013  . Obstructive sleep apnea 06/01/2013  . Osteoarthritis cervical spine  04/25/2013  . Gastroesophageal reflux disease without esophagitis 04/25/2013  . Vasomotor rhinitis 04/25/2013  . Open-angle glaucoma 04/25/2013  . Hyperlipidemia 04/25/2013  . 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 ([place]) 04/25/2013  . Coronary artery disease involving native coronary artery with angina pectoris ([place]) 04/25/2013  . Chronic asthmatic bronchitis ([place]) 04/25/2013  . Idiopathic chronic gout without tophus 04/25/2013  . Severe obesity with body mass index (BMI) of 35.0 to 39.9 with comorbidity ([place]) 04/25/2013  . Right rotator cuff tear 04/25/2013  . Healthcare maintenance 01/15/2013  . Chronic pain syndrome 01/15/2013  . Chronic diastolic heart failure ([place]) 02/04/2012  . Essential hypertension 09/20/2011    Darrel Hoover  PT 08/22/2017, 4:28 PM  [facility] [address] [place], [place], [address] Phone: [phone]   Fax:  [phone]  Name: Juan Calderon MRN: [id number] Date of Birth: 01-Dec-1955  PHYSICAL THERAPY DISCHARGE SUMMARY  Visits from Start of Care: 14  Current functional level related to goals / functional outcomes: See above   Remaining deficits: Stiffness and weakness RT shoulder   Education / Equipment: HEP Plan: Patient agrees to discharge.  Patient goals were partially met. Patient is being discharged due to being pleased with the current functional level.  ?????  Jennifer Paa, PT 09/16/17 8:07 AM Phone: [phone] Fax: [phone] G CODES DONE  

## 2017-08-23 ENCOUNTER — Ambulatory Visit (INDEPENDENT_AMBULATORY_CARE_PROVIDER_SITE_OTHER): Payer: Medicare Other | Admitting: Internal Medicine

## 2017-08-23 ENCOUNTER — Encounter: Payer: Self-pay | Admitting: Internal Medicine

## 2017-08-23 VITALS — BP 114/63 | HR 55 | Wt 293.8 lb

## 2017-08-23 DIAGNOSIS — Z79899 Other long term (current) drug therapy: Secondary | ICD-10-CM

## 2017-08-23 DIAGNOSIS — I25119 Atherosclerotic heart disease of native coronary artery with unspecified angina pectoris: Secondary | ICD-10-CM | POA: Diagnosis not present

## 2017-08-23 DIAGNOSIS — N521 Erectile dysfunction due to diseases classified elsewhere: Secondary | ICD-10-CM

## 2017-08-23 DIAGNOSIS — M818 Other osteoporosis without current pathological fracture: Secondary | ICD-10-CM

## 2017-08-23 DIAGNOSIS — I110 Hypertensive heart disease with heart failure: Secondary | ICD-10-CM

## 2017-08-23 DIAGNOSIS — R238 Other skin changes: Secondary | ICD-10-CM | POA: Diagnosis not present

## 2017-08-23 DIAGNOSIS — G894 Chronic pain syndrome: Secondary | ICD-10-CM | POA: Diagnosis not present

## 2017-08-23 DIAGNOSIS — K219 Gastro-esophageal reflux disease without esophagitis: Secondary | ICD-10-CM

## 2017-08-23 DIAGNOSIS — Z7984 Long term (current) use of oral hypoglycemic drugs: Secondary | ICD-10-CM | POA: Diagnosis not present

## 2017-08-23 DIAGNOSIS — E785 Hyperlipidemia, unspecified: Secondary | ICD-10-CM | POA: Diagnosis not present

## 2017-08-23 DIAGNOSIS — G4733 Obstructive sleep apnea (adult) (pediatric): Secondary | ICD-10-CM

## 2017-08-23 DIAGNOSIS — E1140 Type 2 diabetes mellitus with diabetic neuropathy, unspecified: Secondary | ICD-10-CM | POA: Diagnosis present

## 2017-08-23 DIAGNOSIS — M810 Age-related osteoporosis without current pathological fracture: Secondary | ICD-10-CM | POA: Diagnosis not present

## 2017-08-23 DIAGNOSIS — R1032 Left lower quadrant pain: Secondary | ICD-10-CM

## 2017-08-23 DIAGNOSIS — I5032 Chronic diastolic (congestive) heart failure: Secondary | ICD-10-CM | POA: Diagnosis not present

## 2017-08-23 DIAGNOSIS — Z6839 Body mass index (BMI) 39.0-39.9, adult: Secondary | ICD-10-CM

## 2017-08-23 DIAGNOSIS — E7800 Pure hypercholesterolemia, unspecified: Secondary | ICD-10-CM

## 2017-08-23 DIAGNOSIS — R197 Diarrhea, unspecified: Secondary | ICD-10-CM | POA: Diagnosis not present

## 2017-08-23 DIAGNOSIS — I10 Essential (primary) hypertension: Secondary | ICD-10-CM

## 2017-08-23 DIAGNOSIS — R269 Unspecified abnormalities of gait and mobility: Secondary | ICD-10-CM

## 2017-08-23 DIAGNOSIS — Z79891 Long term (current) use of opiate analgesic: Secondary | ICD-10-CM | POA: Diagnosis not present

## 2017-08-23 LAB — POCT GLYCOSYLATED HEMOGLOBIN (HGB A1C): HEMOGLOBIN A1C: 6.1

## 2017-08-23 LAB — GLUCOSE, CAPILLARY: Glucose-Capillary: 136 mg/dL — ABNORMAL HIGH (ref 65–99)

## 2017-08-23 MED ORDER — OXYCODONE-ACETAMINOPHEN 10-325 MG PO TABS: 1.0000 | ORAL_TABLET | Freq: Four times a day (QID) | ORAL | 0 refills | Status: DC | PRN

## 2017-08-23 NOTE — Progress Notes (Signed)
   Subjective:    Patient ID: Juan Calderon, male    DOB: October 21, 1955, 61 y.o.   MRN: [id number]  HPI  Juan Calderon is here for follow-up of type 2 diabetes, severe obesity, essential hypertension, chronic diastolic heart failure, and chronic pain syndrome. Please see the A&P for the status of the pt's chronic medical problems.  His only new complaint today is a 1 week history of intermittent cramping left lower quadrant abdominal pain improved with defecation. He denies any trauma to the area or any recent sick contacts. He drinks city water, has had no blood in the stool or urine, and has chronic watery stools without constipation. The pain is mild and currently not present.  Review of Systems  Constitutional: Negative for activity change, appetite change and unexpected weight change.  Respiratory: Positive for shortness of breath. Negative for cough and chest tightness.        Chronic dyspnea on exertion, unchanged from baseline  Cardiovascular: Positive for leg swelling. Negative for chest pain and palpitations.       Chronic leg swelling, L > R  Gastrointestinal: Positive for abdominal pain and diarrhea. Negative for abdominal distention, anal bleeding, blood in stool, constipation, nausea, rectal pain and vomiting.  Genitourinary: Negative for flank pain and hematuria.  Musculoskeletal: Positive for arthralgias, back pain and gait problem. Negative for joint swelling and myalgias.       Chronic pain unchanged from baseline, improved with PRN oxycodone  Skin: Positive for wound. Negative for color change and rash.       Chronic post traumatic skin changes on left      Objective:   Physical Exam  Constitutional: He is oriented to person, place, and time. He appears well-developed and well-nourished. No distress.  HENT:  Head: Normocephalic and atraumatic.  Eyes: Conjunctivae are normal. Right eye exhibits no discharge. Left eye exhibits no discharge. No scleral icterus.    Abdominal: Soft. He exhibits no distension. There is no tenderness. There is no rebound and no guarding.  Musculoskeletal: Normal range of motion. He exhibits edema and deformity.  Neurological: He is alert and oriented to person, place, and time. He exhibits normal muscle tone.  Skin: Skin is warm and dry. He is not diaphoretic. No erythema.  Psychiatric: He has a normal mood and affect. His behavior is normal. Judgment and thought content normal.  Nursing note and vitals reviewed.     Assessment & Plan:   Please see problem oriented charting.

## 2017-08-23 NOTE — Assessment & Plan Note (Signed)
Assessment  He had evidence of hypomagnesemia in the past. This was felt to be multifactorial. He needs the torsemide for his chronic diastolic heart failure. He was significantly symptomatic from his gastroesophageal reflux disease even on H2 blockade. Therefore, it was felt the PPI therapy was required. We obtained a magnesium level today on the maximal magnesium supplementation dose. The results are pending at the time of this dictation.  Plan  We will follow-up on the results of the magnesium level obtained today. We will continue with the magnesium oxide 800 mg by mouth twice daily.

## 2017-08-23 NOTE — Assessment & Plan Note (Signed)
Assessment  His gastroesophageal reflux symptoms are improved on the esomeprazole. He takes this medication as needed only.  Plan  He was encouraged to continue the esomeprazole and we will reassess the efficacy of this therapy at the follow-up visit.

## 2017-08-23 NOTE — Patient Instructions (Signed)
It was good to see you again.  You are doing well with your health.  1) Keep taking the medications as you are.  2) I ordered a DEXA scan to look at the strength of your bones.  3) I drew a magnesium level to make sure it is not falling again.  4) We will keep an eye on your tenesmus (stomach pains).  5) I sent in 3 prescriptions for your oxycodone to get you through to mid-March.  I will see you back in 3 months, sooner if necessary.

## 2017-08-23 NOTE — Assessment & Plan Note (Signed)
He is up-to-date on his health care maintenance. 

## 2017-08-23 NOTE — Assessment & Plan Note (Signed)
Assessment  He is tolerating the atorvastatin 20 mg by mouth daily without myalgias.  Plan  We will reassess for evidence of intolerance to this moderate to high intensity statin at the follow-up visit.

## 2017-08-23 NOTE — Assessment & Plan Note (Signed)
Assessment  He denies any change in his chronic dyspnea on exertion. His weight is now at baseline. He's had no orthopnea or paroxysmal nocturnal dyspnea. Thus, clinically it appears that his chronic diastolic heart failure is well compensated on his current regimen which includes aggressive therapy for his hypertension and diabetes. He continues to be unable to afford his oral appliance for his obstructive sleep apnea. He is making some progress in weight loss although most of the recent weight loss was likely fluid.  Plan  We will continue aggressive risk factor modification of his hypertension and diabetes. We will continue to encourage purchase of the oral appliance for his obstructive sleep apnea when he can afford it. Continued weight loss was also recommended. We will reassess for signs or symptoms suggestive of decompensation of his chronic diastolic heart failure at the follow-up visit.

## 2017-08-23 NOTE — Assessment & Plan Note (Signed)
Assessment  His blood pressure today was excellent at 114/63. This is on metoprolol 25 mg by mouth twice daily and terazosin 5 mg by mouth each night.  Plan  We will continue the metoprolol and terazosin at the current doses and reassess his blood pressure control at the follow-up visit.

## 2017-08-23 NOTE — Assessment & Plan Note (Signed)
Assessment  His chronic pain syndrome is stable on the oxycodone 10-325 mg 1 tablet every 6 hours as needed for pain dispense #105 per month. A UDS was obtained during this visit and is pending at the time of this dictation. This regimen allows him to maintain mobility to do things around his house and in his community.  Plan  Three prescriptions were provided electronically to the [facility] on [address]. which should get him through mid March 2019. We will reassess the efficacy of this therapy at the follow-up visit.

## 2017-08-23 NOTE — Assessment & Plan Note (Signed)
Assessment  His diabetes is well controlled today with a hemoglobin A1c of 6.1. This is on metformin 1000 mg by mouth twice daily. He continues to suffer from erectile dysfunction and has not been able to afford the Viagra.  Plan  We will continue the metformin 1000 mg by mouth twice daily. We will continue to work on weight loss, and he is aware of the importance of this. He has a Viagra prescription available should he have the money to afford it. We will assess the efficacy of this therapy if he purchases the medication. We will reassess his glycemic control with a repeat hemoglobin A1c at the follow-up visit. A diabetic foot exam was done today and he is otherwise up-to-date on his diabetic health care maintenance.

## 2017-08-23 NOTE — Assessment & Plan Note (Signed)
Assessment  We had a discussion about bisphosphonate therapy given the clinical diagnosis of osteoporosis. We also discussed the side effects associated with bisphosphonate therapy. In him, the most likely and debilitating side effect is the worsening of the gastroesophageal reflux disease. He opted to obtain a DEXA scan to assess the degree of his osteoporosis, if any. This will help him make a decision on whether or not he wants to assume the potential side effects of bisphosphonate therapy to gain the benefits.  Plan  A DEXA scan was ordered. We will reassess the osteoporosis at the follow-up visit in light of the results of the DEXA scan.

## 2017-08-23 NOTE — Assessment & Plan Note (Signed)
Assessment  His weight today is 293 pounds. This is down 6 pounds from the last clinic visit with me. At that visit his weight was up 7 pounds. Therefore, he is likely near his baseline weight. He denies any signs or symptoms that are consistent with decompensation of his chronic diastolic heart failure.  Plan  He was praised for his weight loss in the interim and was encouraged to continue to work on portion control and dietary modifications in hopes of losing further weight which will be important for his diabetes, hyperlipidemia, hypertension, obstructive sleep apnea, chronic osteoarthritic changes, and gastroesophageal reflux disease. We will reassess the weight at the follow-up visit.

## 2017-08-23 NOTE — Assessment & Plan Note (Signed)
Assessment  He denies any chest pain in the interim and has not required any sublingual nitroglycerin. This is on metoprolol 25 mg by mouth twice daily.  Plan  We will continue the metoprolol at 25 mg by mouth twice daily and reassess for evidence of angina at the follow-up visit. We are also continuing with aggressive risk factor modification including tight control of his diabetes, hypertension, and use of a moderate to high intensity statin.

## 2017-08-24 LAB — MAGNESIUM: MAGNESIUM: 1.6 mg/dL (ref 1.6–2.3)

## 2017-08-30 LAB — TOXASSURE SELECT,+ANTIDEPR,UR

## 2017-08-31 ENCOUNTER — Other Ambulatory Visit: Payer: Self-pay | Admitting: Internal Medicine

## 2017-08-31 DIAGNOSIS — I5032 Chronic diastolic (congestive) heart failure: Secondary | ICD-10-CM

## 2017-09-05 ENCOUNTER — Other Ambulatory Visit: Payer: Self-pay

## 2017-09-05 ENCOUNTER — Other Ambulatory Visit: Payer: Self-pay | Admitting: Internal Medicine

## 2017-09-05 DIAGNOSIS — I4891 Unspecified atrial fibrillation: Secondary | ICD-10-CM

## 2017-09-05 DIAGNOSIS — J300 Vasomotor rhinitis: Secondary | ICD-10-CM

## 2017-09-05 MED ORDER — WARFARIN SODIUM 5 MG PO TABS: ORAL_TABLET | ORAL | 0 refills | Status: DC

## 2017-09-09 ENCOUNTER — Telehealth: Payer: Self-pay | Admitting: Internal Medicine

## 2017-09-12 DIAGNOSIS — M170 Bilateral primary osteoarthritis of knee: Secondary | ICD-10-CM | POA: Diagnosis not present

## 2017-09-17 NOTE — Progress Notes (Signed)
Patient ID: Juan Calderon, male   DOB: Jan 21, 1956, 62 y.o.   MRN: [id number]  Mg 1.6  Continue current replacement therapy.  UDS appropriate

## 2017-09-18 ENCOUNTER — Ambulatory Visit (INDEPENDENT_AMBULATORY_CARE_PROVIDER_SITE_OTHER): Payer: Medicare Other | Admitting: Physician Assistant

## 2017-09-18 ENCOUNTER — Ambulatory Visit (INDEPENDENT_AMBULATORY_CARE_PROVIDER_SITE_OTHER): Payer: Medicare Other | Admitting: *Deleted

## 2017-09-18 VITALS — BP 118/82 | HR 60 | Ht 72.0 in | Wt 288.0 lb

## 2017-09-18 DIAGNOSIS — Z79899 Other long term (current) drug therapy: Secondary | ICD-10-CM

## 2017-09-18 DIAGNOSIS — Z5181 Encounter for therapeutic drug level monitoring: Secondary | ICD-10-CM | POA: Diagnosis not present

## 2017-09-18 DIAGNOSIS — I480 Paroxysmal atrial fibrillation: Secondary | ICD-10-CM | POA: Diagnosis not present

## 2017-09-18 DIAGNOSIS — I10 Essential (primary) hypertension: Secondary | ICD-10-CM | POA: Diagnosis not present

## 2017-09-18 DIAGNOSIS — I5032 Chronic diastolic (congestive) heart failure: Secondary | ICD-10-CM | POA: Diagnosis not present

## 2017-09-18 LAB — POCT INR: INR: 3.3

## 2017-09-18 NOTE — Patient Instructions (Signed)
Description   Do not take coumadin tomorrow Jan 17th as he has taken coumadin today then  continue taking 1 tablet everyday except 1/2 tablet on Mondays and Fridays. Recheck INR in 2 weeks . Coumadin clinic [phone] with concerns.

## 2017-09-18 NOTE — Progress Notes (Signed)
Cardiology Office Note Date:  09/18/2017  Patient ID:  Juan Calderon, Juan Calderon 04-13-1956, MRN [id number] PCP:  Oval Linsey, MD  Cardiologist:  Dr. Julianne Handler Electrophysiologist: Dr. [name]   Chief Complaint: planned f/u  History of Present Illness: Juan Calderon is a 62 y.o. male with history of chronic CHF (Diastolic), CAD (non-obstructive by cath in 2007), persistent AFib, COPD, HTN, HLD, gout, GERD, DM, chronic venous stasis (PMD is managing), morbid obesity.  March 2016 and had c/o dyspnea on exertion. Stress myoview 11/25/14 with no ischemia. 24 hour monitor showed atrial fib with bradycardia (rates as low as 40 bpm), frequent PVCs (9000 in 24 hours) and non-sustained VT. He was seen by Dr. [name] 12/23/14 and was started on Flecainide. He underwent DCCV on 12/30/14. His Lasix was changed to Torsemide. He was admitted to [facility] June 2016 with renal failure felt to be due to over-diuresis with torsemide. Echo May 2016 with LVEF=55-60%. He was seen by Dr. [name] August 2016 and his Lasix was restarted. He has been continued on Flecainide  The patient was seen in the ER 06/29/16 with c/o CP, he states started while at the coumadin check and resolved by the time he reached the ER.  Described as sharp b/l chest at the lower rib borders, not radiating, and no associated symptoms.  He was resumed on his metoprolol and has remained CP free. Had a Trop that was 0.00 and no EKG changes. We discussed at length his c/o SOB. His meds reviewed on his torsemide 3x week.  His cxr at the ER had some congestion, no edema.  He told me that his SOB was chronic now for many years, not changed or escalating, no rest SOB, no nighttime symptoms.  He has COPD he was never a smoke but has second hand exposure which unfortunately still does.  He denied feeling like he has LE swelling or bloating/water retention.  He denied dizziness, near syncope or syncope.  No bleeding or signs of bleeding reported.  He was seen by myself  after his ER visit, HR was 60bpm, had not had any recurrent CP and planned for a monitor that was not completed, he saw Dr. [name] in April 2018, no ongoing symptoms, HR w/ambulation increased 80's after ambulation in the hallway, no changes were made to his therapy.  05/12/17 had a fall an L humerous fracture.  I saw him in Oct 2018, he stated his L leg gave out fell and broke his arm, being managed with splinting.  In discussion he mentioed that he did put his arm out to stop the fall but remembered nothing after that until he was being put into the ambulance, and uncertain exactly what happened.  He denied any kind of CP, palpitations with the event or days/weeks prior, or since.  He reported his breathing is at his baseline.  He denied hx of syncope, no dizzy spells, near syncope since.  He had not been able to afford the oral device for is sleep apnea yet,  He reported his ETOH as infrequent though historically was more.  He denied any bleeding or signs of bleeding, getting his coumadin managed with the coumadin clinic.  There was report of him sleeping in a recliner, but he stateed that is only after the arm fracture, was where he was most comfortable in the least pain.  Usually sleeps in his bead without symptoms of PND or orthopnea.  He was again recommended to have a monitor placed given hx  of bradycardia particularly though he declined.  He comes today feeling like he is doing OK, continues to generally feel tired most of the time, not much energy.  Unfortunately he has not been able to pursue the oral device for treatment of his apnea yet.  He denies any kind of dizziness, no near syncope or syncope, no falls.  No CP, palitations, or SOB, denies DOE, no symptoms of PND, orthopnea.  Her does not perceive any water retention, swelling.  He denies any bleeding or signs of bleeding.  He tells me his PMD does labs pretty routinely, at least 2x year.  AFib Hx: DCCV 2016 AAD: Flecainide, 2016 Hx of  digoxin stopped with rates 30's and BB was reduced 2016 Intolerant of CPAP pending perhaps an oral device BB was stopped 05/2016 ? Bradycardia resumed in 2017 after a CP episode  Past Medical History:  Diagnosis Date  . Alcohol abuse    . C6 radiculopathy 01/24/2016   Right upper extremity, mild to moderate electrically by EMG on 01/24/2016  . Cataract    Left eye  . Chronic diastolic heart failure ([place])     with mild left ventricular hypertrophy on Echo 02/2010  . Chronic obstructive pulmonary disease ([place])    . Chronic osteomyelitis of femur ([place]) 04/06/2016  . Chronic pain syndrome     Left arm and leg s/p traumatic injury   . Chronic renal insufficiency    . Coronary artery disease     25% LAD stenosis on cath 2007.  Stable angina.  . Diverticulosis    . Essential hypertension    . Gastroesophageal reflux disease    . Gout    . Hyperlipidemia LDL goal < 100    . Long-term current use of opiate analgesic 09/07/2016  . Mild carpal tunnel syndrome of right wrist 01/24/2016   Mild degree electrically per EMG 01/24/2016   . Morbid obesity with BMI of 40.0-44.9, adult ([place])    . Normocytic anemia    . Obstructive sleep apnea     Moderate, AHI 29.8 per hour with moderately loud snoring and oxygen desaturation to a nadir of 79%. CPAP titration resulted in a prescription for 17 CWP.    [name] Open-angle glaucoma    . Osteoarthritis cervical spine    . Osteoarthritis of left knee 06/19/2013   Tricompartmental disease.  Treated with double hinged upright knee brace, steroid/xylocaine knee injections, and NSAIDs   . Osteoporosis 05/14/2017   s/p fracture of the right humerus from a fall at ground hight  . Paroxysmal atrial fibrillation ([place]) 10/25/2015  . Persistent atrial fibrillation ([place])    . Right rotator cuff tear     Large full-thickness tear of the supraspinatus with mild retraction but no atrophy   . Secondary male hypogonadism 02/07/2017   Likely secondary to chronic opioid use  .  Subclinical hypothyroidism    . 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 [facility])      Past Surgical History:  Procedure Laterality Date  . CARDIOVERSION N/A 12/30/2014   Procedure: CARDIOVERSION;  Surgeon: [name], MD;  Location: [facility] ENDOSCOPY;  Service: Cardiovascular;  Laterality: N/A;  . FRACTURE SURGERY Left 1980's   Elbow  . Left arm surgery    . Left leg surgery    . SHOULDER SURGERY     Right    Current Outpatient Medications  Medication Sig Dispense Refill  . albuterol (PROVENTIL HFA;VENTOLIN HFA) 108 (90 Base) MCG/ACT inhaler Inhale 2 puffs  into the lungs every 6 (six) hours as needed for wheezing or shortness of breath. 1 Inhaler 0  . allopurinol (ZYLOPRIM) 100 MG tablet Take 3 tablets (300 mg total) by mouth daily. Please note change in number of tablets to take daily. 270 tablet 3  . atorvastatin (LIPITOR) 20 MG tablet Take 1 tablet (20 mg total) by mouth daily. 90 tablet 3  . Blood Glucose Monitoring Suppl (ONETOUCH VERIO) w/Device KIT 1 each by Does not apply route daily. 1 kit 0  . Dextromethorphan-Guaifenesin 20-400 MG/5ML SYRP Take 5 mLs by mouth every 4 (four) hours as needed (cough). 120 mL 0  . esomeprazole (NEXIUM) 40 MG capsule Take 1 capsule (40 mg total) by mouth daily at 12 noon. 90 capsule 3  . flecainide (TAMBOCOR) 100 MG tablet Take 1 tablet (100 mg total) by mouth every 12 (twelve) hours. 180 tablet 3  . glucose blood (ONETOUCH VERIO) test strip Use as instructed 100 each 12  . latanoprost (XALATAN) 0.005 % ophthalmic solution Place 1 drop into both eyes at bedtime. 7.5 mL 2  . magnesium oxide (MAG-OX) 400 MG tablet Take 2 tablets (800 mg total) by mouth 2 (two) times daily. 360 tablet 3  . metFORMIN (GLUCOPHAGE) 1000 MG tablet Take 1 tablet (1,000 mg total) by mouth 2 (two) times daily with a meal. 180 tablet 3  . metoprolol tartrate (LOPRESSOR) 25 MG tablet Take 1 tablet (25 mg total) by mouth 2 (two) times daily. 180  tablet 3  . nitroGLYCERIN (NITROSTAT) 0.4 MG SL tablet Place 1 tablet (0.4 mg total) under the tongue every 5 (five) minutes as needed for chest pain. 30 tablet 11  . ONETOUCH DELICA LANCETS 96G MISC Use 1 strip daily 100 each 5  . oxyCODONE-acetaminophen (PERCOCET) 10-325 MG tablet Take 1 tablet by mouth every 6 (six) hours as needed for pain. 105 tablet 0  . sildenafil (VIAGRA) 100 MG tablet Take 1 tablet (100 mg total) by mouth daily as needed for erectile dysfunction. 5 tablet 11  . terazosin (HYTRIN) 5 MG capsule Take 1 capsule (5 mg total) by mouth at bedtime. 90 capsule 3  . torsemide (DEMADEX) 20 MG tablet TAKE 2 TABLETS BY MOUTH 3 TIMES PER WEEK 72 tablet 3  . warfarin (COUMADIN) 5 MG tablet Take as directed by coumadin clinic 90 tablet 0   No current facility-administered medications for this visit.     Allergies:   Ramipril and Testosterone   Social History:  The patient  reports that  has never smoked. he has never used smokeless tobacco. He reports that he drinks alcohol. He reports that he does not use drugs.   Family History:  The patient's family history includes Alzheimer's disease in his father; Early death in his brother; Heart failure in his brother and mother; Hypertension in his brother and sister; Osteoarthritis in his brother; Prostate cancer in his brother.  ROS:  Please see the history of present illness.  All other systems are reviewed and otherwise negative.   PHYSICAL EXAM:  VS:  BP 118/82   Pulse 60   Ht 6' (1.829 m)   Wt 288 lb (130.6 kg)   BMI 39.06 kg/m  BMI: Body mass index is 39.06 kg/m. Well nourished, well developed, in no acute distress  HEENT: normocephalic, atraumatic  Neck: no JVD, carotid bruits or masses Cardiac:  RRR; no significant murmurs, no rubs, or gallops Lungs:  CTA b/l, no wheezing, rhonchi or rales  Abd: soft, nontender MS: no  deformity or atrophy, RUE is in splint/sling Ext:   no edema is noted, LLE notable with marked scarring  2/2 reconstructive sx many years ago Skin: warm and dry, no rash Neuro:  No gross deficits appreciated Psych: euthymic mood, full affect   EKG:  Done today and reviewed by myself  SB, 55bpm, PR 150m, QRS 93m QTc 44581m0/16/18: SR, 64bpm, PR 200m66mRS 112ms22mc 464ms,92m T changes appear similar to older EKGs   Stress myoview 11/25/14: Impression Exercise Capacity: Lexiscan with no exercise. BP Response: Normal blood pressure response. Clinical Symptoms: There is dyspnea and chest pressure ECG Impression: No significant ST segment change suggestive of ischemia. Comparison with Prior Nuclear Study: Compared to 05/10/12, no change. Overall Impression: Normal stress nuclear study. LV Ejection Fraction:Study not gated. . LV Wall Motion: Study not gated due to atrial fibrillation; there appears to be significant LVE.  Echo May 2016: Left ventricle: LVEF is approximately 55 to 60% The cavity size was normal. Wall thickness was increased in a pattern of mild LVH. - Pulmonary arteries: PA peak pressure: 39 mm Hg (S).  Recent Labs: 05/12/2017: ALT 19; Hemoglobin 10.5; Platelets 226 05/13/2017: BUN 25; Creatinine, Ser 1.19; Potassium 5.2; Sodium 135 08/23/2017: Magnesium 1.6  No results found for requested labs within last 8760 hours.   CrCl cannot be calculated (Patient's most recent lab result is older than the maximum 21 days allowed.).   Wt Readings from Last 3 Encounters:  09/18/17 288 lb (130.6 kg)  08/23/17 293 lb 12.8 oz (133.3 kg)  06/18/17 285 lb (129.3 kg)     Other studies reviewed: Additional studies/records reviewed today include: summarized above  ASSESSMENT AND PLAN:  1. Paroxysmal AFib     SR today     CHA2DS2Vasc is 2, on warfarin     Flecainide/Metoprolol     EKG intervals today is stable  2. Diastolic CHF, chronic SOB      Exam is euvolemic, weight is down     Re-discussed daily weights  3. HTN     Looks OK, no changes  4. CP     Not  an ongoing complaint   Disposition: We will see him back in 6 months, sooner if needed  Current medicines are reviewed at length with the patient today.  The patient did not have any concerns regarding medicines.  SignedHaywood Lasso 09/18/2017 2:32 PM     [facility] [place] [id number])  [phone]

## 2017-09-18 NOTE — Patient Instructions (Addendum)
Medication Instructions:   Your physician recommends that you continue on your current medications as directed. Please refer to the Current Medication list given to you today.   If you need a refill on your cardiac medications before your next appointment, please call your pharmacy.  Labwork: NONE ORDERED  TODAY    Testing/Procedures: NONE ORDERED  TODAY    Follow-Up: Your physician wants you to follow-up in:  IN  6  MONTHS WITH URSUY You will receive a reminder letter in the mail two months in advance. If you don't receive a letter, please call our office to schedule the follow-up appointment.      Any Other Special Instructions Will Be Listed Below (If Applicable).                                                                                                                                                   

## 2017-09-24 ENCOUNTER — Other Ambulatory Visit: Payer: Self-pay | Admitting: *Deleted

## 2017-09-24 MED ORDER — DEXTROMETHORPHAN-GUAIFENESIN 20-400 MG/5ML PO SYRP: 5.0000 mL | ORAL_SOLUTION | ORAL | 0 refills | Status: DC | PRN

## 2017-09-24 NOTE — Telephone Encounter (Signed)
Pt walks in to request a refill on his "cough syrup"-will forward info to pcp for consideration.[name]1/22/20191:49 PM

## 2017-10-02 ENCOUNTER — Ambulatory Visit (INDEPENDENT_AMBULATORY_CARE_PROVIDER_SITE_OTHER): Payer: Medicare Other | Admitting: *Deleted

## 2017-10-02 DIAGNOSIS — I480 Paroxysmal atrial fibrillation: Secondary | ICD-10-CM

## 2017-10-02 DIAGNOSIS — Z5181 Encounter for therapeutic drug level monitoring: Secondary | ICD-10-CM | POA: Diagnosis not present

## 2017-10-02 LAB — POCT INR: INR: 3

## 2017-10-02 NOTE — Patient Instructions (Signed)
Description   Continue taking 1 tablet everyday except 1/2 tablet on Mondays and Fridays. Recheck INR in 3 weeks . Coumadin clinic [phone] with concerns.

## 2017-10-15 ENCOUNTER — Telehealth: Payer: Self-pay | Admitting: Internal Medicine

## 2017-10-15 DIAGNOSIS — G894 Chronic pain syndrome: Secondary | ICD-10-CM

## 2017-10-15 MED ORDER — OXYCODONE-ACETAMINOPHEN 10-325 MG PO TABS: 1.0000 | ORAL_TABLET | ORAL | 0 refills | Status: DC | PRN

## 2017-10-15 NOTE — Telephone Encounter (Signed)
I called patient and he informed me his thigh pain has been exacerbated once again.  The last time this occurred was in 2017.  He has actually been seen by orthopedics in [facility] for this issue and apparently he has an appointment scheduled for 10/23/2017 to make a final decision on possible surgical intervention.  He asked if I could add back the MS Contin, but I am hesitant to do this as he did not tolerate it previously secondary to nausea.  We decided to provide an additional 25 tablets of percocet 10-325 mg to allow him to take 1 tablet Q4 H until seen in [place].  This is on top of his chronic percocet (he is usually prescribed a max of 3/day).  He is aware he is not to take more than 1 tablet every 4 hours as needed (and no more than 6 in 24 hours).  The prescription was sent electronically to his preferred pharmacy.  He will call me with any issues if this does not provide him with some relief.

## 2017-10-15 NOTE — Telephone Encounter (Signed)
Pt calls and states the leg pain is getting worse, he states that he has an appt in [place] 2/20 and they will decide what to do at that time, he states he would like for dr Eppie Gibson to call him and discuss his pain med regimen, his ph# [phone]. He states he is really in a lot of pain Offered [facility] appt but he would rather dr Eppie Gibson call him

## 2017-10-22 ENCOUNTER — Telehealth: Payer: Self-pay | Admitting: *Deleted

## 2017-10-22 DIAGNOSIS — M8580 Other specified disorders of bone density and structure, unspecified site: Secondary | ICD-10-CM

## 2017-10-22 DIAGNOSIS — M85821 Other specified disorders of bone density and structure, right upper arm: Secondary | ICD-10-CM

## 2017-10-22 NOTE — Telephone Encounter (Signed)
Second order placed with alternative diagnosis.

## 2017-10-22 NOTE — Telephone Encounter (Signed)
The breast center calls and states insurance refused payment for bone density scan with the current diag code for osteoporosis, needs to change to osteopenia, will need a new order for scan with this diag. Sending to dr Eppie Gibson and Patrick North

## 2017-10-23 ENCOUNTER — Other Ambulatory Visit: Payer: Self-pay | Admitting: Internal Medicine

## 2017-10-23 DIAGNOSIS — H4010X0 Unspecified open-angle glaucoma, stage unspecified: Secondary | ICD-10-CM

## 2017-11-08 ENCOUNTER — Ambulatory Visit (INDEPENDENT_AMBULATORY_CARE_PROVIDER_SITE_OTHER): Payer: Medicare Other | Admitting: *Deleted

## 2017-11-08 DIAGNOSIS — I480 Paroxysmal atrial fibrillation: Secondary | ICD-10-CM | POA: Diagnosis not present

## 2017-11-08 DIAGNOSIS — Z5181 Encounter for therapeutic drug level monitoring: Secondary | ICD-10-CM | POA: Diagnosis not present

## 2017-11-08 LAB — POCT INR: INR: 3.3

## 2017-11-08 NOTE — Patient Instructions (Addendum)
Description   Do not take any Coumadin tomorrow then continue taking 1 tablet everyday except 1/2 tablet on Mondays and Fridays. Recheck INR in 3 weeks. Coumadin clinic [phone] with concerns.

## 2017-11-12 ENCOUNTER — Emergency Department (HOSPITAL_COMMUNITY)
Admission: EM | Admit: 2017-11-12 | Discharge: 2017-11-12 | Disposition: A | Discharge status: Home / Self Care | Payer: Medicare Other | Attending: Emergency Medicine | Admitting: Emergency Medicine

## 2017-11-12 ENCOUNTER — Other Ambulatory Visit: Payer: Self-pay

## 2017-11-12 ENCOUNTER — Emergency Department (HOSPITAL_COMMUNITY): Payer: Medicare Other

## 2017-11-12 ENCOUNTER — Encounter (HOSPITAL_COMMUNITY): Payer: Self-pay | Admitting: Emergency Medicine

## 2017-11-12 DIAGNOSIS — Z79899 Other long term (current) drug therapy: Secondary | ICD-10-CM | POA: Insufficient documentation

## 2017-11-12 DIAGNOSIS — J449 Chronic obstructive pulmonary disease, unspecified: Secondary | ICD-10-CM | POA: Insufficient documentation

## 2017-11-12 DIAGNOSIS — E1122 Type 2 diabetes mellitus with diabetic chronic kidney disease: Secondary | ICD-10-CM | POA: Insufficient documentation

## 2017-11-12 DIAGNOSIS — I2510 Atherosclerotic heart disease of native coronary artery without angina pectoris: Secondary | ICD-10-CM | POA: Diagnosis not present

## 2017-11-12 DIAGNOSIS — I5032 Chronic diastolic (congestive) heart failure: Secondary | ICD-10-CM | POA: Diagnosis not present

## 2017-11-12 DIAGNOSIS — S8990XA Unspecified injury of unspecified lower leg, initial encounter: Secondary | ICD-10-CM | POA: Diagnosis not present

## 2017-11-12 DIAGNOSIS — Z7901 Long term (current) use of anticoagulants: Secondary | ICD-10-CM | POA: Diagnosis not present

## 2017-11-12 DIAGNOSIS — M25561 Pain in right knee: Secondary | ICD-10-CM | POA: Diagnosis not present

## 2017-11-12 DIAGNOSIS — I130 Hypertensive heart and chronic kidney disease with heart failure and stage 1 through stage 4 chronic kidney disease, or unspecified chronic kidney disease: Secondary | ICD-10-CM | POA: Diagnosis not present

## 2017-11-12 DIAGNOSIS — M86659 Other chronic osteomyelitis, unspecified thigh: Secondary | ICD-10-CM

## 2017-11-12 DIAGNOSIS — Z7984 Long term (current) use of oral hypoglycemic drugs: Secondary | ICD-10-CM | POA: Diagnosis not present

## 2017-11-12 DIAGNOSIS — S8991XA Unspecified injury of right lower leg, initial encounter: Secondary | ICD-10-CM | POA: Diagnosis not present

## 2017-11-12 DIAGNOSIS — N189 Chronic kidney disease, unspecified: Secondary | ICD-10-CM | POA: Insufficient documentation

## 2017-11-12 DIAGNOSIS — M86652 Other chronic osteomyelitis, left thigh: Secondary | ICD-10-CM | POA: Diagnosis not present

## 2017-11-12 DIAGNOSIS — E039 Hypothyroidism, unspecified: Secondary | ICD-10-CM | POA: Insufficient documentation

## 2017-11-12 DIAGNOSIS — M25562 Pain in left knee: Secondary | ICD-10-CM | POA: Diagnosis not present

## 2017-11-12 DIAGNOSIS — R600 Localized edema: Secondary | ICD-10-CM | POA: Diagnosis not present

## 2017-11-12 DIAGNOSIS — E1140 Type 2 diabetes mellitus with diabetic neuropathy, unspecified: Secondary | ICD-10-CM | POA: Insufficient documentation

## 2017-11-12 DIAGNOSIS — S8992XA Unspecified injury of left lower leg, initial encounter: Secondary | ICD-10-CM | POA: Diagnosis not present

## 2017-11-12 DIAGNOSIS — M1990 Unspecified osteoarthritis, unspecified site: Secondary | ICD-10-CM | POA: Diagnosis not present

## 2017-11-12 LAB — COMPREHENSIVE METABOLIC PANEL
ALBUMIN: 3.5 g/dL (ref 3.5–5.0)
ALT: 14 U/L — ABNORMAL LOW (ref 17–63)
AST: 16 U/L (ref 15–41)
Alkaline Phosphatase: 99 U/L (ref 38–126)
Anion gap: 11 (ref 5–15)
BUN: 15 mg/dL (ref 6–20)
CHLORIDE: 102 mmol/L (ref 101–111)
CO2: 25 mmol/L (ref 22–32)
Calcium: 9.2 mg/dL (ref 8.9–10.3)
Creatinine, Ser: 0.99 mg/dL (ref 0.61–1.24)
GFR calc Af Amer: >60 mL/min (ref >60)
GFR calc non Af Amer: >60 mL/min (ref >60)
GLUCOSE: 155 mg/dL — AB (ref 65–99)
POTASSIUM: 4 mmol/L (ref 3.5–5.1)
Sodium: 138 mmol/L (ref 135–145)
Total Bilirubin: 0.5 mg/dL (ref 0.3–1.2)
Total Protein: 7.3 g/dL (ref 6.5–8.1)

## 2017-11-12 LAB — CBC WITH DIFFERENTIAL/PLATELET
Basophils Absolute: 0 10*3/uL (ref 0.0–0.1)
Basophils Relative: 0 %
EOS PCT: 1 %
Eosinophils Absolute: 0 10*3/uL (ref 0.0–0.7)
HCT: 38.9 % — ABNORMAL LOW (ref 39.0–52.0)
Hemoglobin: 12.3 g/dL — ABNORMAL LOW (ref 13.0–17.0)
LYMPHS ABS: 1.2 10*3/uL (ref 0.7–4.0)
LYMPHS PCT: 15 %
MCH: 29.4 pg (ref 26.0–34.0)
MCHC: 31.6 g/dL (ref 30.0–36.0)
MCV: 93.1 fL (ref 78.0–100.0)
MONO ABS: 0.4 10*3/uL (ref 0.1–1.0)
Monocytes Relative: 4 %
Neutro Abs: 6.3 10*3/uL (ref 1.7–7.7)
Neutrophils Relative %: 80 %
PLATELETS: 193 10*3/uL (ref 150–400)
RBC: 4.18 MIL/uL — ABNORMAL LOW (ref 4.22–5.81)
RDW: 14.7 % (ref 11.5–15.5)
WBC: 7.9 10*3/uL (ref 4.0–10.5)

## 2017-11-12 LAB — SEDIMENTATION RATE: SED RATE: 52 mm/h — AB (ref 0–16)

## 2017-11-12 MED ORDER — HYDROMORPHONE HCL 1 MG/ML IJ SOLN
1.0000 mg | Freq: Once | INTRAMUSCULAR | Status: DC
  Filled 2017-11-12: qty 1

## 2017-11-12 MED ORDER — HYDROMORPHONE HCL 1 MG/ML IJ SOLN: 1.0000 mg | Freq: Once | INTRAMUSCULAR | Status: DC

## 2017-11-12 MED ORDER — HYDROMORPHONE HCL 1 MG/ML IJ SOLN
1.0000 mg | Freq: Once | INTRAMUSCULAR | Status: AC
  Administered 2017-11-12: 1 mg via INTRAVENOUS

## 2017-11-12 MED ORDER — HYDROMORPHONE HCL 1 MG/ML IJ SOLN
2.0000 mg | Freq: Once | INTRAMUSCULAR | Status: DC
  Administered 2017-11-12: 2 mg via INTRAMUSCULAR
  Filled 2017-11-12: qty 2

## 2017-11-12 MED ORDER — HYDROMORPHONE HCL 1 MG/ML IJ SOLN
1.0000 mg | Freq: Once | INTRAMUSCULAR | Status: AC
  Administered 2017-11-12: 1 mg via INTRAMUSCULAR
  Filled 2017-11-12: qty 1

## 2017-11-12 MED ORDER — GADOBENATE DIMEGLUMINE 529 MG/ML IV SOLN
20.0000 mL | Freq: Once | INTRAVENOUS | Status: AC | PRN
  Administered 2017-11-12: 20 mL via INTRAVENOUS

## 2017-11-12 NOTE — ED Provider Notes (Signed)
Care assumed at shift change from [place], PA-C, pending MRI femur results. See her note for full HPI and workup. Briefly, pt w chronic left knee pain reporting with worsening pain x few days. Xray consistent with chronic osteomyelitis, ortho was consulted who recommends MRI. Plan is for discharge w crutches and ortho f/u if MRI neg. If positive, consult his orthopedic at [facility].  Physical Exam  BP (!) 153/85 (BP Location: Right Arm)   Pulse 73   Temp 98.5 F (36.9 C) (Oral)   Resp 14   Ht 6' (1.829 m)   Wt 132 kg (291 lb)   SpO2 97%   BMI 39.47 kg/m   Physical Exam  Constitutional: He appears well-developed and well-nourished.  HENT:  Head: Normocephalic and atraumatic.  Eyes: Conjunctivae are normal.  Cardiovascular: Normal rate.  Pulmonary/Chest: Effort normal.  Psychiatric: He has a normal mood and affect. His behavior is normal.  Nursing note and vitals reviewed.   ED Course/Procedures   Clinical Course as of Nov 12 2044  Tue Nov 12, 2017  1129 Patient with x-ray concerning for potential distal femur osteomyelitis.  He did have an injection of the knee joint back in January.  I spoke with the nurse manager at [facility] about any previous MRIs which they have no record of.  Patient will receive MRI of the femur with and without contrast to rule out osteomyelitis.  His sed rate is noted to be elevated.  [AH]  2008 Results discussed with patient. Consult placed to his orthopedics, [facility]. MR FEMUR LEFT W WO CONTRAST [JR]  2033 Spoke with Dr. Griffin Basil w [facility]. Given absence of fever or leukocytosis,  recommends no surgical intervention today.   Supportive measures and evaluate at [facility] - best place for follow up. Return precautions  [JR]    Clinical Course User Index [AH] Margarita Mail, PA-C [JR] Robinson, [place] N, PA-C   11/12/17 at 19:27 CLINICAL DATA:  Chronic knee pain with worsening and swelling over the past few days on left. Patient is unable  to walk the left leg. Plain radiographs.  EXAM: MR OF THE LEFT LOWER EXTREMITY WITHOUT AND WITH CONTRAST  TECHNIQUE: Multiplanar, multisequence MR imaging of the left thigh was performed both before and after administration of intravenous contrast.  CONTRAST:  17mL MULTIHANCE GADOBENATE DIMEGLUMINE 529 MG/ML IV SOLN  COMPARISON:  Plain radiographs from 11/12/2017 and MRI from 06/07/2015  FINDINGS: Bones/Joint/Cartilage   Redemonstration of cortical thickening of the left femoral diaphysis with abnormal intramedullary edema and serpiginous enhancing fluid compatible with changes of chronic osteomyelitis and Brodie abscesses. The abnormal area of edema spans 27 cm. The largest focal intramedullary abscess is in the distal femoral metaphysis measuring 3.3 x 1.6 x 1.6 cm. No frank bone destruction. Trace fluid is seen within the patellofemoral compartments of both knees. There is tricompartmental osteoarthritis of the knees. Ligaments   Noncontributory Muscles and Tendons   Reactive intramuscular edema of the adjacent vastus intermedius, lateralis and abductor magnus muscles. No intramuscular abscess. Soft tissues   No soft tissue mass nor abscess.  IMPRESSION: 1. Redemonstration of chronic periosteal thickening of the left femoral diaphysis secondary to chronic osteomyelitis and intramedullary Brodie abscesses. The extent of intramedullary involvement is slightly more extensive than in 2016 now spanning 27 cm versus 20 cm previously. The largest Brodie abscess is seen in the distal femoral metaphysis measuring 3.3 x 1.6 x 1.6 cm. Serpiginous extension of intramedullary abscess is seen along the course of the  left femoral diaphysis. 2. Reactive intramuscular edema of the left vastus lateralis, intermedius and abductor magnus muscles. 3. Tricompartmental osteoarthritis both knees with small left joint effusion. Electronically Signed   By: Ashley Royalty M.D.   On:  11/12/2017 19:46  Procedures  MDM  MRI showing worsening chronic osteomyelitis, from comparison in 2016. Results discussed with Dr. Griffin Basil with [facility]. He reviewed MRI results from today and compared with previous; states no significant interval change since 2016. Reassured that pt is afebrile without leukocytosis. He recommends pt is safe for discharge with supportive measures. Also recommends pt follow up with [facility] for chronic management, as he has been seen there in the past.   Discussed recommendations with patient. Will discharge w supportive measures, and recommend to take prescribed medications for pain. States he has a walker and wheelchair at home that he can use for ambulation. Safe for discharge.  Discussed results, findings, treatment and follow up. Patient advised of return precautions. Patient verbalized understanding and agreed with plan.       Robinson, [place] N, PA-C 11/12/17 2106    Isla Pence, MD 11/13/17 [phone]

## 2017-11-12 NOTE — ED Notes (Signed)
Called over to MRI to start IV.

## 2017-11-12 NOTE — ED Notes (Signed)
Pt taken to rr in wheelchair.

## 2017-11-12 NOTE — ED Notes (Signed)
Pt arrives back from MRI, awaiting results.

## 2017-11-12 NOTE — ED Triage Notes (Addendum)
Pt to ED [facility] from home-- with c/o bilateral knee pain, left worse than right-- pain started Friday-- hx of arthritis -- has been referred to [facility] by [facility]--  Pt walks regularly with a walker or cane

## 2017-11-12 NOTE — ED Notes (Signed)
Pt verbalized understanding of all d/c instructions, pain management, and follow up. VSS.

## 2017-11-12 NOTE — ED Provider Notes (Signed)
[facility] EMERGENCY DEPARTMENT Provider Note   CSN: [id number] Arrival date & time: 11/12/17  3329     History   Chief Complaint Chief Complaint  Patient presents with  . Knee Pain    HPI Juan Calderon is a 62 y.o. male with chronic pain of his knees. He has had worsening pain and swelling over the past few days worse on the left. Today he was unable to walk on the left leg. He did not take any of his pain medications today because he  Came by ambulance. The patient normally takes Percocet 10-325 for pain but it has not helped. He denies fever or chills. He rates his pain at 10/10.   HPI  Past Medical History:  Diagnosis Date  . Alcohol abuse    . C6 radiculopathy 01/24/2016   Right upper extremity, mild to moderate electrically by EMG on 01/24/2016  . Cataract    Left eye  . Chronic diastolic heart failure ([place])     with mild left ventricular hypertrophy on Echo 02/2010  . Chronic obstructive pulmonary disease ([place])    . Chronic osteomyelitis of femur ([place]) 04/06/2016  . Chronic pain syndrome     Left arm and leg s/p traumatic injury   . Chronic renal insufficiency    . Coronary artery disease     25% LAD stenosis on cath 2007.  Stable angina.  . Diverticulosis    . Essential hypertension    . Gastroesophageal reflux disease    . Gout    . Hyperlipidemia LDL goal < 100    . Long-term current use of opiate analgesic 09/07/2016  . Mild carpal tunnel syndrome of right wrist 01/24/2016   Mild degree electrically per EMG 01/24/2016   . Morbid obesity with BMI of 40.0-44.9, adult ([place])    . Normocytic anemia    . Obstructive sleep apnea     Moderate, AHI 29.8 per hour with moderately loud snoring and oxygen desaturation to a nadir of 79%. CPAP titration resulted in a prescription for 17 CWP.    [name] Open-angle glaucoma    . Osteoarthritis cervical spine    . Osteoarthritis of left knee 06/19/2013   Tricompartmental disease.  Treated with double hinged upright  knee brace, steroid/xylocaine knee injections, and NSAIDs   . Osteoporosis 05/14/2017   s/p fracture of the right humerus from a fall at ground hight  . Paroxysmal atrial fibrillation ([place]) 10/25/2015  . Persistent atrial fibrillation ([place])    . Right rotator cuff tear     Large full-thickness tear of the supraspinatus with mild retraction but no atrophy   . Secondary male hypogonadism 02/07/2017   Likely secondary to chronic opioid use  . Subclinical hypothyroidism    . 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 [facility])      Patient Active Problem List   Diagnosis Date Noted  . Chronic use of opiate drugs therapeutic purposes 08/23/2017  . Right humeral fracture 05/24/2017  . Osteoporosis 05/14/2017  . Secondary male hypogonadism 02/07/2017  . Hypomagnesemia 12/28/2016  . Mild carpal tunnel syndrome of right wrist 01/24/2016  . C6 radiculopathy 01/24/2016  . Paroxysmal atrial fibrillation ([place]) 10/25/2015  . Alcohol abuse 02/28/2015  . Constipation due to opioid therapy 12/24/2014  . Venous stasis ulcer of left lower extremity ([place]) 08/06/2014  . Encounter for therapeutic drug monitoring 11/16/2013  . Diverticulosis 11/12/2013  . Post-traumatic osteoarthritis of left knee 06/19/2013  . Obstructive sleep apnea 06/01/2013  .  Osteoarthritis cervical spine 04/25/2013  . Gastroesophageal reflux disease without esophagitis 04/25/2013  . Vasomotor rhinitis 04/25/2013  . Open-angle glaucoma 04/25/2013  . Hyperlipidemia 04/25/2013  . 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 ([place]) 04/25/2013  . Coronary artery disease involving native coronary artery with angina pectoris ([place]) 04/25/2013  . Chronic asthmatic bronchitis ([place]) 04/25/2013  . Idiopathic chronic gout without tophus 04/25/2013  . Severe obesity with body mass index (BMI) of 35.0 to 39.9 with comorbidity ([place]) 04/25/2013  . Right rotator cuff tear 04/25/2013  . Healthcare  maintenance 01/15/2013  . Chronic pain syndrome 01/15/2013  . Chronic diastolic heart failure ([place]) 02/04/2012  . Essential hypertension 09/20/2011    Past Surgical History:  Procedure Laterality Date  . CARDIOVERSION N/A 12/30/2014   Procedure: CARDIOVERSION;  Surgeon: [name], MD;  Location: [facility] [place] ENDOSCOPY;  Service: Cardiovascular;  Laterality: N/A;  . FRACTURE SURGERY Left 1980's   Elbow  . Left arm surgery    . Left leg surgery    . SHOULDER SURGERY     Right       Home Medications    Prior to Admission medications   Medication Sig Start Date End Date Taking? Authorizing Provider  albuterol (PROVENTIL HFA;VENTOLIN HFA) 108 (90 Base) MCG/ACT inhaler Inhale 2 puffs into the lungs every 6 (six) hours as needed for wheezing or shortness of breath. 10/17/16   Milagros Loll, MD  allopurinol (ZYLOPRIM) 100 MG tablet Take 3 tablets (300 mg total) by mouth daily. Please note change in number of tablets to take daily. 03/13/17   Oval Linsey, MD  atorvastatin (LIPITOR) 20 MG tablet Take 1 tablet (20 mg total) by mouth daily. 05/24/17   Oval Linsey, MD  Blood Glucose Monitoring Suppl [facility] VERIO) w/Device KIT 1 each by Does not apply route daily. 11/04/15   Loleta Chance, MD  Dextromethorphan-Guaifenesin 20-400 MG/5ML SYRP Take 5 mLs by mouth every 4 (four) hours as needed (cough). 09/24/17   Oval Linsey, MD  esomeprazole (NEXIUM) 40 MG capsule Take 1 capsule (40 mg total) by mouth daily at 12 noon. 05/24/17   Oval Linsey, MD  flecainide (TAMBOCOR) 100 MG tablet Take 1 tablet (100 mg total) by mouth every 12 (twelve) hours. 12/20/16   Oval Linsey, MD  glucose blood (ONETOUCH VERIO) test strip Use as instructed 05/24/16   Oval Linsey, MD  latanoprost (XALATAN) 0.005 % ophthalmic solution Place 1 drop into both eyes at bedtime. 10/11/16   Oval Linsey, MD  magnesium oxide (MAG-OX) 400 MG tablet Take 2 tablets (800 mg total) by mouth 2 (two) times daily. 12/28/16    Oval Linsey, MD  metFORMIN (GLUCOPHAGE) 1000 MG tablet Take 1 tablet (1,000 mg total) by mouth 2 (two) times daily with a meal. 12/20/16   Oval Linsey, MD  metoprolol tartrate (LOPRESSOR) 25 MG tablet Take 1 tablet (25 mg total) by mouth 2 (two) times daily. 05/24/17   Oval Linsey, MD  nitroGLYCERIN (NITROSTAT) 0.4 MG SL tablet Place 1 tablet (0.4 mg total) under the tongue every 5 (five) minutes as needed for chest pain. 06/10/15   Oval Linsey, MD  [facility] DELICA LANCETS 33O MISC Use 1 strip daily 05/24/16   Oval Linsey, MD  oxyCODONE-acetaminophen (PERCOCET) 10-325 MG tablet Take 1 tablet by mouth every 6 (six) hours as needed for pain. 08/23/17   Oval Linsey, MD  oxyCODONE-acetaminophen (PERCOCET) 10-325 MG tablet Take 1 tablet by mouth every 4 (four) hours as needed for pain. 10/15/17  Oval Linsey, MD  sildenafil (VIAGRA) 100 MG tablet Take 1 tablet (100 mg total) by mouth daily as needed for erectile dysfunction. 01/25/17   Oval Linsey, MD  terazosin (HYTRIN) 5 MG capsule Take 1 capsule (5 mg total) by mouth at bedtime. 05/07/17   Oval Linsey, MD  torsemide (DEMADEX) 20 MG tablet TAKE 2 TABLETS BY MOUTH 3 TIMES PER WEEK 03/20/17   Bartholomew Crews, MD  warfarin (COUMADIN) 5 MG tablet Take as directed by coumadin clinic 09/05/17   Burnell Blanks, MD    Family History Family History  Problem Relation Age of Onset  . Heart failure Mother   . Alzheimer's disease Father   . Heart failure Brother   . Osteoarthritis Brother   . Prostate cancer Brother   . Early death Brother        Manufacturing systems engineer  . Hypertension Sister   . Hypertension Brother   . Heart attack Neg Hx   . Stroke Neg Hx     Social History Social History   Tobacco Use  . Smoking status: Never Smoker  . Smokeless tobacco: Never Used  Substance Use Topics  . Alcohol use: Yes  . Drug use: No     Allergies   Ramipril and Testosterone   Review of Systems Review of  Systems  Ten systems reviewed and are negative for acute change, except as noted in the HPI.   Physical Exam Updated Vital Signs BP 125/72 (BP Location: Left Arm)   Pulse 84   Temp 98.1 F (36.7 C) (Oral)   Resp 17   Ht 6' (1.829 m)   Wt 132 kg (291 lb)   SpO2 95%   BMI 39.47 kg/m   Physical Exam  Constitutional: He appears well-developed and well-nourished. No distress.  HENT:  Head: Normocephalic and atraumatic.  Eyes: Conjunctivae are normal. No scleral icterus.  Neck: Normal range of motion. Neck supple.  Cardiovascular: Normal rate, regular rhythm and normal heart sounds.  Pulmonary/Chest: Effort normal and breath sounds normal. No respiratory distress.  Abdominal: Soft. There is no tenderness.  Musculoskeletal: He exhibits no edema.  Left knee exquisitely tender to palpation across the entire joint.  Mild swelling present without overt effusion.  Patient is unable to tolerate much movement of the joint.  He will give about 5 degrees of flexion and extension and appears to be in excruciating pain.  Normal distal pulse.  No heat or redness present.  Neurological: He is alert.  Skin: Skin is warm and dry. He is not diaphoretic.  Left lower extremity with multiple well-healed but scarred skin grafts.  Psychiatric: His behavior is normal.  Nursing note and vitals reviewed.    ED Treatments / Results  Labs (all labs ordered are listed, but only abnormal results are displayed) Labs Reviewed  COMPREHENSIVE METABOLIC PANEL - Abnormal; Notable for the following components:      Result Value   Glucose, Bld 155 (*)    ALT 14 (*)    All other components within normal limits  CBC WITH DIFFERENTIAL/PLATELET - Abnormal; Notable for the following components:   RBC 4.18 (*)    Hemoglobin 12.3 (*)    HCT 38.9 (*)    All other components within normal limits  SEDIMENTATION RATE    EKG  EKG Interpretation None       Radiology Dg Knee Complete 4 Views Left  Result Date:  11/12/2017 CLINICAL DATA:  Pain.  Fall approximately 1 month prior EXAM: LEFT  KNEE - COMPLETE 4+ VIEW COMPARISON:  February 28, 2016 and May 21, 2015. FINDINGS: Frontal, lateral, and bilateral oblique views were obtained. No acute fracture or dislocation. No joint effusion. There is osteoarthritic change in the medial compartment with narrowing and spurring. There is also spurring along the posterior patella. There is persistent periosteal thickening throughout the visualized distal femur involving portions of the diaphysis and metaphysis-diaphysis junction. There is relative lucency in the distal femoral diaphysis, felt to be present previously but better seen currently. There are scattered small sclerotic foci in the proximal tibia, stable. IMPRESSION: 1. No acute fracture or dislocation. No appreciable joint effusion. 2. Chronic sclerosis along the distal femur with lucency in the distal femoral shaft. These changes raise question of chronic osteomyelitis. The lucency in the femoral shaft potentially could represent recurrent osteomyelitis. Given this appearance, correlation with nuclear medicine bone scan to assess for abnormal metabolic activity in this region may be advisable. Alternatively, MR of the distal left femur and knee region may be helpful in this regard. 3.  Osteoarthritic change, most notably medially. Electronically Signed   By: Lowella Grip III M.D.   On: 11/12/2017 08:41   Dg Knee Complete 4 Views Right  Result Date: 11/12/2017 CLINICAL DATA:  Pain.  Fall approximately 1 month prior EXAM: RIGHT KNEE - COMPLETE 4+ VIEW COMPARISON:  None. FINDINGS: Frontal, lateral, and bilateral oblique views were obtained. There is an apparent bipartite patella. No acute fracture or dislocation is evident. There is no appreciable joint effusion. There is mild bony overgrowth along the posterior patella with slight narrowing of the patellofemoral joint. There is also mild narrowing medially in mild  spurring medially. No erosive change. IMPRESSION: Bipartite patella. Osteoarthritic change in the patellofemoral joint and medial compartment regions. No acute fracture or dislocation. No joint effusion. Electronically Signed   By: Lowella Grip III M.D.   On: 11/12/2017 08:35    Procedures Procedures (including critical care time)  Medications Ordered in ED Medications - No data to display   Initial Impression / Assessment and Plan / ED Course  I have reviewed the triage vital signs and the nursing notes.  Pertinent labs & imaging results that were available during my care of the patient were reviewed by me and considered in my medical decision making (see chart for details).  Clinical Course as of Nov 12 1129  Tue Nov 12, 2017  1129 Patient with x-ray concerning for potential distal femur osteomyelitis.  He did have an injection of the knee joint back in January.  I spoke with the nurse manager at [facility] about any previous MRIs which they have no record of.  Patient will receive MRI of the femur with and without contrast to rule out osteomyelitis.  His sed rate is noted to be elevated.  [AH]    Clinical Course User Index [AH] Margarita Mail, PA-C    Patient with knee pain.  Elevated sed rate and lucency in the distal femur concerning for potential chronic osteomyelitis.  He is awaiting an MRI of the left knee.  I have given sign out to PA [place] who will assume care of the patient.  Plan is to consult [facility] if positive MRI, discharge home with crutches otherwise.  Final Clinical Impressions(s) / ED Diagnoses   Final diagnoses:  None    ED Discharge Orders    None       Margarita Mail, PA-C 11/12/17 1606    Isla Pence, MD 11/13/17 [phone]

## 2017-11-12 NOTE — Discharge Instructions (Signed)
Attend your appointment with your orthopedic specialist at [facility] on Monday, to follow up on your visit today and discuss your MRI results. Take your prescribed medications as needed for pain. You can take advil/ibuprofen with this medication for additional pain relief. Elevate your leg when possible. You can apply ice for 20 minutes at at time. Return to the ER for fever, chills, or new or concerning symptoms.

## 2017-11-12 NOTE — Progress Notes (Signed)
Discussed case with ER.  Patient has had an extensive history of left LE issues including remote trauma.  He was worked up in 2016 by [facility] for concerns on MRI of chronic osteomyelitis and additionally by [facility] with potential plans for knee arthroplasty.  He has had a history of injections to his knees in the setting of tricompartmental OA as well which previously have helped his symptoms.    Patient reports no fever or chills, moderately elevated ESR but normal WBC.  MRI findings consistent with some increased area of signal compared to MRI from 2016 though previous MRI was without contrast.    Per the report from the ER the patient is near his baseline condition and has history of chronic pain in the extremity with long term narcotic use.  We would recommend patient follow up with team at [facility] who has previously evaluated these changes to determine if they feel intervention is necessary. 

## 2017-11-13 ENCOUNTER — Telehealth: Payer: Self-pay | Admitting: Internal Medicine

## 2017-11-13 NOTE — Telephone Encounter (Signed)
Patient is wanting to talk to the physician about restarting the medicine for the pain in leg

## 2017-11-18 DIAGNOSIS — M1712 Unilateral primary osteoarthritis, left knee: Secondary | ICD-10-CM | POA: Diagnosis not present

## 2017-11-18 DIAGNOSIS — Z720 Tobacco use: Secondary | ICD-10-CM | POA: Diagnosis not present

## 2017-11-18 DIAGNOSIS — I4891 Unspecified atrial fibrillation: Secondary | ICD-10-CM | POA: Diagnosis not present

## 2017-11-18 DIAGNOSIS — J449 Chronic obstructive pulmonary disease, unspecified: Secondary | ICD-10-CM | POA: Diagnosis not present

## 2017-11-18 DIAGNOSIS — E119 Type 2 diabetes mellitus without complications: Secondary | ICD-10-CM | POA: Diagnosis not present

## 2017-11-18 DIAGNOSIS — M898X5 Other specified disorders of bone, thigh: Secondary | ICD-10-CM | POA: Diagnosis not present

## 2017-11-18 DIAGNOSIS — I10 Essential (primary) hypertension: Secondary | ICD-10-CM | POA: Diagnosis not present

## 2017-11-18 DIAGNOSIS — M109 Gout, unspecified: Secondary | ICD-10-CM | POA: Diagnosis not present

## 2017-11-18 DIAGNOSIS — M25562 Pain in left knee: Secondary | ICD-10-CM | POA: Diagnosis not present

## 2017-11-18 NOTE — Telephone Encounter (Signed)
I returned the call.  Juan Calderon states the pain has eased up.  He is currently in the waiting room of the Orthopedist at [facility].  He has an appointment scheduled with me in 4 days.  He will discuss the outcome of today's visit and his pain regimen moving forward at the visit on Friday.

## 2017-11-20 DIAGNOSIS — M1711 Unilateral primary osteoarthritis, right knee: Secondary | ICD-10-CM | POA: Diagnosis not present

## 2017-11-20 DIAGNOSIS — M1712 Unilateral primary osteoarthritis, left knee: Secondary | ICD-10-CM | POA: Diagnosis not present

## 2017-11-20 DIAGNOSIS — M869 Osteomyelitis, unspecified: Secondary | ICD-10-CM | POA: Diagnosis not present

## 2017-11-22 ENCOUNTER — Encounter: Payer: Self-pay | Admitting: Internal Medicine

## 2017-11-22 ENCOUNTER — Telehealth: Payer: Self-pay | Admitting: Internal Medicine

## 2017-11-22 ENCOUNTER — Ambulatory Visit (INDEPENDENT_AMBULATORY_CARE_PROVIDER_SITE_OTHER): Payer: Medicare Other | Admitting: Internal Medicine

## 2017-11-22 VITALS — BP 122/60 | HR 58 | Temp 97.6°F | Ht 72.0 in | Wt 293.0 lb

## 2017-11-22 DIAGNOSIS — Z7984 Long term (current) use of oral hypoglycemic drugs: Secondary | ICD-10-CM

## 2017-11-22 DIAGNOSIS — D126 Benign neoplasm of colon, unspecified: Secondary | ICD-10-CM | POA: Insufficient documentation

## 2017-11-22 DIAGNOSIS — M86652 Other chronic osteomyelitis, left thigh: Secondary | ICD-10-CM | POA: Diagnosis not present

## 2017-11-22 DIAGNOSIS — Z860100 Personal history of colon polyps, unspecified: Secondary | ICD-10-CM

## 2017-11-22 DIAGNOSIS — M549 Dorsalgia, unspecified: Secondary | ICD-10-CM | POA: Diagnosis not present

## 2017-11-22 DIAGNOSIS — Z6839 Body mass index (BMI) 39.0-39.9, adult: Secondary | ICD-10-CM

## 2017-11-22 DIAGNOSIS — E1140 Type 2 diabetes mellitus with diabetic neuropathy, unspecified: Secondary | ICD-10-CM

## 2017-11-22 DIAGNOSIS — G894 Chronic pain syndrome: Secondary | ICD-10-CM

## 2017-11-22 DIAGNOSIS — Z79891 Long term (current) use of opiate analgesic: Secondary | ICD-10-CM

## 2017-11-22 DIAGNOSIS — M791 Myalgia, unspecified site: Secondary | ICD-10-CM | POA: Diagnosis not present

## 2017-11-22 DIAGNOSIS — R2689 Other abnormalities of gait and mobility: Secondary | ICD-10-CM | POA: Diagnosis not present

## 2017-11-22 DIAGNOSIS — M25559 Pain in unspecified hip: Secondary | ICD-10-CM | POA: Diagnosis not present

## 2017-11-22 DIAGNOSIS — M1712 Unilateral primary osteoarthritis, left knee: Secondary | ICD-10-CM

## 2017-11-22 DIAGNOSIS — N521 Erectile dysfunction due to diseases classified elsewhere: Secondary | ICD-10-CM | POA: Diagnosis not present

## 2017-11-22 HISTORY — DX: Other chronic osteomyelitis, left thigh: M86.652

## 2017-11-22 HISTORY — DX: Benign neoplasm of colon, unspecified: D12.6

## 2017-11-22 LAB — GLUCOSE, CAPILLARY: GLUCOSE-CAPILLARY: 156 mg/dL — AB (ref 65–99)

## 2017-11-22 LAB — POCT GLYCOSYLATED HEMOGLOBIN (HGB A1C): Hemoglobin A1C: 6.9

## 2017-11-22 MED ORDER — OXYCODONE HCL ER 10 MG PO T12A: 10.0000 mg | EXTENDED_RELEASE_TABLET | Freq: Two times a day (BID) | ORAL | 0 refills | Status: DC

## 2017-11-22 MED ORDER — OXYCODONE-ACETAMINOPHEN 10-325 MG PO TABS: 1.0000 | ORAL_TABLET | Freq: Four times a day (QID) | ORAL | 0 refills | Status: DC | PRN

## 2017-11-22 NOTE — Assessment & Plan Note (Signed)
Assessment  His weight is stable today at 293 pounds. He was praised for maintaining a stable weight. Further weight loss will be beneficial in controlling his left knee osteoarthritis.  Plan  He was encouraged to continue to work on diet in order to lose additional weight. We will reassess the success in his weight loss at the follow-up visit.

## 2017-11-22 NOTE — Telephone Encounter (Signed)
This is correct.  He is to have both.

## 2017-11-22 NOTE — Assessment & Plan Note (Signed)
Assessment  He informed me he has a history of colonic polyps prior to his most recent colonoscopy in 2010. He is inquiring when he should have a repeat colonoscopy. He states his prior gastroenterologist told him he needed surveillance colonoscopies every 5 years. He is therefore way overdue.  Plan  An ambulatory referral for colonoscopy was placed today for a surveillance colonoscopy. Unfortunately, we do not have the specifics on his prior colonic polyps other than they were prior to 2010.

## 2017-11-22 NOTE — Progress Notes (Signed)
   Subjective:    Patient ID: XZAVION DOSWELL, male    DOB: 06-Feb-1956, 62 y.o.   MRN: [id number]  HPI  MARKIE FRITH is here for follow-up of chronic left lower leg pain, type 2 diabetes, severe obesity, and personal history of colonic polyps. Please see the A&P for the status of the pt's chronic medical problems.  Review of Systems  Constitutional: Negative for unexpected weight change.  Musculoskeletal: Positive for arthralgias, back pain, gait problem and myalgias.      Objective:   Physical Exam  Constitutional: He is oriented to person, place, and time. He appears well-developed and well-nourished. No distress.  HENT:  Head: Normocephalic and atraumatic.  Eyes: Conjunctivae are normal. Right eye exhibits no discharge. Left eye exhibits no discharge.  Musculoskeletal:  Antalgic gait  Neurological: He is alert and oriented to person, place, and time. He exhibits normal muscle tone.  Skin: He is not diaphoretic.  Psychiatric: He has a normal mood and affect. His behavior is normal. Judgment and thought content normal.  Nursing note and vitals reviewed.     Assessment & Plan:   Please see problem oriented charting.

## 2017-11-22 NOTE — Telephone Encounter (Signed)
Pharmacy calls to confirm pt is to receive both percocet and oxycontin, the chart visit note from today was read to the pharmacist and it was agreed that pt is to have both. Gave diag codes for medicaid/ medicare. Pt has not been picking scripts up regularly as they are written per pharmacist, ask her to fax 6 month pick up history just in case not available thru state database. Will put in dr [employer]. Again confirmed to pharmacist that pt is to have both percocet and oxycontin.

## 2017-11-22 NOTE — Assessment & Plan Note (Signed)
Assessment  His diabetes is well controlled today with a hemoglobin A1c of 6.9. This is on metformin 1000 mg by mouth twice daily. We did not address the erectile dysfunction today.  Plan  We will continue the metformin at 1000 mg by mouth twice daily and reassess the glycemic control with a repeat hemoglobin A1c at the follow-up visit.

## 2017-11-22 NOTE — Assessment & Plan Note (Signed)
Assessment  He has chronic osteomyelitis of the left femur status post a traumatic injury. This periodically flares with significant pain not controlled by his as needed oxycodone. He was recently seen in orthopedics at the [facility] [place]. Although they do not feel surgery is an option they are awaiting a multidisciplinary conference to decide on the best management for his chronic left femur osteomyelitis.  Plan  We have started OxyContin 10 mg by mouth every 12 hours. The reason for this is to provide chronic pain relief given his chronic left femur osteomyelitis and significant flares of pain. In the past he did not respond to MS Contin secondary to it causing nausea. The OxyContin therefore is being trial to see if it to causes him nausea. If not, it can be further titrated in the future. He is to continue the oxycodone 06-05-24 mg 1 tablet every 6 hours as needed for pain as well. We will reassess the efficacy of the OxyContin and oxycodone in controlling his pain at the follow-up visit.

## 2017-11-22 NOTE — Assessment & Plan Note (Signed)
Assessment  He continues to have chronic pain of the left knee, femur, hip, and back. This is posttraumatic. Other than the pain related to the chronic left femur osteomyelitis his pain is reasonably well controlled on Percocet 325 mg-10 mg 1 tablet every 6 hours as needed for pain dispense #105.  Plan  We will continue with the Percocet as provided above. We will reassess the efficacy of this therapy in controlling his pain at the follow-up visit.

## 2017-11-22 NOTE — Patient Instructions (Signed)
It was good to see you again.  I am sorry your leg continues to give you so much trouble.  1) I started OxyContin 10 mg twice daily.  This is to see if you have nausea on this medication.  If not, we may be able to increase dose at a later time to try to get better control of you leg pain.  Keep taking the percocet like you are.  2) I placed a referral for a colonoscopy.  3) Keep taking your other medications like you are.  I will see you back in 3 months, sooner if necessary.

## 2017-11-23 ENCOUNTER — Other Ambulatory Visit: Payer: Self-pay | Admitting: Internal Medicine

## 2017-11-25 NOTE — Telephone Encounter (Signed)
On chronic opioids.

## 2017-11-27 ENCOUNTER — Other Ambulatory Visit: Payer: Self-pay | Admitting: Cardiovascular Disease

## 2017-11-27 DIAGNOSIS — I4891 Unspecified atrial fibrillation: Secondary | ICD-10-CM

## 2017-11-28 ENCOUNTER — Encounter: Payer: Self-pay | Admitting: Internal Medicine

## 2017-11-29 ENCOUNTER — Ambulatory Visit (INDEPENDENT_AMBULATORY_CARE_PROVIDER_SITE_OTHER): Payer: Medicare Other | Admitting: *Deleted

## 2017-11-29 DIAGNOSIS — Z5181 Encounter for therapeutic drug level monitoring: Secondary | ICD-10-CM | POA: Diagnosis not present

## 2017-11-29 LAB — POCT INR: INR: 3.2

## 2017-11-29 NOTE — Patient Instructions (Signed)
Description   Do not take any Coumadin tomorrow then start taking 1 tablet everyday except 1/2 tablet on Mondays, Wednesdays and Fridays. Recheck INR in 2 weeks. Coumadin clinic [phone] with concerns.

## 2017-12-01 ENCOUNTER — Other Ambulatory Visit: Payer: Self-pay | Admitting: Internal Medicine

## 2017-12-01 DIAGNOSIS — N521 Erectile dysfunction due to diseases classified elsewhere: Secondary | ICD-10-CM

## 2017-12-01 DIAGNOSIS — I480 Paroxysmal atrial fibrillation: Secondary | ICD-10-CM

## 2017-12-01 DIAGNOSIS — E1140 Type 2 diabetes mellitus with diabetic neuropathy, unspecified: Secondary | ICD-10-CM

## 2017-12-02 ENCOUNTER — Ambulatory Visit
Admission: RE | Admit: 2017-12-02 | Discharge: 2017-12-02 | Disposition: A | Discharge status: Home / Self Care | Payer: Medicare Other | Source: Ambulatory Visit | Attending: Internal Medicine | Admitting: Internal Medicine

## 2017-12-02 DIAGNOSIS — M85821 Other specified disorders of bone density and structure, right upper arm: Secondary | ICD-10-CM

## 2017-12-02 DIAGNOSIS — Z13820 Encounter for screening for osteoporosis: Secondary | ICD-10-CM | POA: Diagnosis not present

## 2017-12-04 DIAGNOSIS — M899 Disorder of bone, unspecified: Secondary | ICD-10-CM | POA: Diagnosis not present

## 2017-12-13 ENCOUNTER — Telehealth: Payer: Self-pay | Admitting: *Deleted

## 2017-12-13 ENCOUNTER — Ambulatory Visit (INDEPENDENT_AMBULATORY_CARE_PROVIDER_SITE_OTHER): Payer: Medicare Other | Admitting: *Deleted

## 2017-12-13 DIAGNOSIS — Z5181 Encounter for therapeutic drug level monitoring: Secondary | ICD-10-CM

## 2017-12-13 DIAGNOSIS — I4891 Unspecified atrial fibrillation: Secondary | ICD-10-CM | POA: Diagnosis not present

## 2017-12-13 LAB — POCT INR: INR: 1.9

## 2017-12-13 NOTE — Telephone Encounter (Signed)
Pt was seen in the office this afternoon and stated he was going to have Surgery on 12/30/17. Pt did not know the Physician's name at the time of his Anticoagulation Appt. Pt stated he was told he had to stop his Warfarin/ Coumadin.  Advised pt that we need communication regarding how long they would like him to hold and approval from Cardiologist regarding & he verbalized understanding.  Pt called back & stated his Orthopedic Surgeon is Dr. Kandis Nab & the number is [phone]. Called their office and waited over 30 minutes on hold, therefore, hung up & will try them back on Monday morning.

## 2017-12-13 NOTE — Patient Instructions (Addendum)
Description   Today when you get home take 1/2 tablet then continue taking 1 tablet then continue taking 1 tablet everyday except 1/2 tablet on Mondays, Wednesdays and Fridays. Recheck INR in 2 weeks. Coumadin clinic [phone] with concerns.

## 2017-12-16 NOTE — Telephone Encounter (Signed)
Returned call to Dr. Wilhelmenia Blase office & was transferred to [facility] Nurse Coordinator for Dr. Sherlynn Stalls & had to leave a message regarding the issue at hand. Her voicemail stated she would return on Tuesday & to leave a detailed msg. Left her message to callback regarding the pt & the need for a clearance form to hold Warfarin.

## 2017-12-17 ENCOUNTER — Other Ambulatory Visit: Payer: Self-pay | Admitting: Internal Medicine

## 2017-12-17 DIAGNOSIS — G4733 Obstructive sleep apnea (adult) (pediatric): Secondary | ICD-10-CM | POA: Diagnosis not present

## 2017-12-17 DIAGNOSIS — K219 Gastro-esophageal reflux disease without esophagitis: Secondary | ICD-10-CM

## 2017-12-17 DIAGNOSIS — I480 Paroxysmal atrial fibrillation: Secondary | ICD-10-CM | POA: Diagnosis not present

## 2017-12-17 DIAGNOSIS — I10 Essential (primary) hypertension: Secondary | ICD-10-CM | POA: Diagnosis not present

## 2017-12-17 NOTE — Telephone Encounter (Signed)
Dr Eppie Gibson D/C'd Ranitidine 02/07/17 since not effective. Now back on a PPI. H2B does not need refilled today. Dr Eppie Gibson will be back on the 18th to review.

## 2017-12-18 NOTE — Telephone Encounter (Signed)
Patient with diagnosis of Afib on warfarin for anticoagulation.    Procedure: debridement and antibiotic graft placement in left femur due to osteomyelitis Date of procedure: 12/30/17  CHADS2-VASc score of  4 (CHF, HTN, AGE, DM2, stroke/tia x 2, CAD, AGE, male)  Per office protocol, patient can hold warfarin for 5 days prior to procedure.

## 2017-12-18 NOTE — Telephone Encounter (Signed)
Spoke with Otila Kluver nurse with [facility] and she states that Dr Sherlynn Stalls is going to do [place] and Debridement and local antibiotic graft placement of left distal femur due  to left distal femur osteomyelitis Dr Sherlynn Stalls states wants pt to be off coumadin 5 days before surgery and to evaluate if needs bridging . Otila Kluver [facility] phone [phone]  Asked Otila Kluver if they could send clearance form and she states they do not send form that the form is sent from [place] office to them

## 2017-12-24 ENCOUNTER — Ambulatory Visit (INDEPENDENT_AMBULATORY_CARE_PROVIDER_SITE_OTHER): Payer: Medicare Other | Admitting: *Deleted

## 2017-12-24 DIAGNOSIS — Z5181 Encounter for therapeutic drug level monitoring: Secondary | ICD-10-CM

## 2017-12-24 LAB — POCT INR: INR: 2.6

## 2017-12-24 NOTE — Patient Instructions (Addendum)
12/24/17-last dose of Coumadin  12/25/17-Hold Coumadin for procedure on 12/30/17  12/26/17-Hold Coumadin for procedure on 12/30/17  12/27/17-Hold Coumadin for procedure on 12/30/17  12/28/17-Hold Coumadin for procedure on 12/30/17  12/29/17-Hold Coumadin for procedure on 12/30/17  12/30/17-Hold Coumadin for procedure today; you Physician will instruct you when to resume Coumadin. Continue taking Coumadin until next INR in 1 week after procedure. Call Coumadin Clinic with any questions [phone]  Description   Take your last dose today then start holding for procedure on 12/30/17.   Normal dose: 1 tablet then continue taking 1 tablet everyday except 1/2 tablet on Mondays, Wednesdays and Fridays. Recheck INR in 1 week after procedure.  Coumadin clinic [phone] with concerns.

## 2017-12-30 DIAGNOSIS — M1712 Unilateral primary osteoarthritis, left knee: Secondary | ICD-10-CM | POA: Diagnosis present

## 2017-12-30 DIAGNOSIS — M86662 Other chronic osteomyelitis, left tibia and fibula: Secondary | ICD-10-CM | POA: Diagnosis not present

## 2017-12-30 DIAGNOSIS — M6281 Muscle weakness (generalized): Secondary | ICD-10-CM | POA: Diagnosis not present

## 2017-12-30 DIAGNOSIS — R609 Edema, unspecified: Secondary | ICD-10-CM | POA: Diagnosis not present

## 2017-12-30 DIAGNOSIS — M86152 Other acute osteomyelitis, left femur: Secondary | ICD-10-CM | POA: Diagnosis not present

## 2017-12-30 DIAGNOSIS — L02416 Cutaneous abscess of left lower limb: Secondary | ICD-10-CM | POA: Diagnosis not present

## 2017-12-30 DIAGNOSIS — E785 Hyperlipidemia, unspecified: Secondary | ICD-10-CM | POA: Diagnosis not present

## 2017-12-30 DIAGNOSIS — I499 Cardiac arrhythmia, unspecified: Secondary | ICD-10-CM | POA: Diagnosis not present

## 2017-12-30 DIAGNOSIS — K219 Gastro-esophageal reflux disease without esophagitis: Secondary | ICD-10-CM | POA: Diagnosis present

## 2017-12-30 DIAGNOSIS — H409 Unspecified glaucoma: Secondary | ICD-10-CM | POA: Diagnosis not present

## 2017-12-30 DIAGNOSIS — G4733 Obstructive sleep apnea (adult) (pediatric): Secondary | ICD-10-CM | POA: Diagnosis present

## 2017-12-30 DIAGNOSIS — R279 Unspecified lack of coordination: Secondary | ICD-10-CM | POA: Diagnosis not present

## 2017-12-30 DIAGNOSIS — Z888 Allergy status to other drugs, medicaments and biological substances status: Secondary | ICD-10-CM | POA: Diagnosis not present

## 2017-12-30 DIAGNOSIS — E119 Type 2 diabetes mellitus without complications: Secondary | ICD-10-CM | POA: Diagnosis not present

## 2017-12-30 DIAGNOSIS — N189 Chronic kidney disease, unspecified: Secondary | ICD-10-CM | POA: Diagnosis present

## 2017-12-30 DIAGNOSIS — R52 Pain, unspecified: Secondary | ICD-10-CM | POA: Diagnosis not present

## 2017-12-30 DIAGNOSIS — M109 Gout, unspecified: Secondary | ICD-10-CM | POA: Diagnosis present

## 2017-12-30 DIAGNOSIS — I130 Hypertensive heart and chronic kidney disease with heart failure and stage 1 through stage 4 chronic kidney disease, or unspecified chronic kidney disease: Secondary | ICD-10-CM | POA: Diagnosis present

## 2017-12-30 DIAGNOSIS — I10 Essential (primary) hypertension: Secondary | ICD-10-CM | POA: Diagnosis not present

## 2017-12-30 DIAGNOSIS — I480 Paroxysmal atrial fibrillation: Secondary | ICD-10-CM | POA: Diagnosis present

## 2017-12-30 DIAGNOSIS — M86652 Other chronic osteomyelitis, left thigh: Secondary | ICD-10-CM | POA: Diagnosis not present

## 2017-12-30 DIAGNOSIS — R001 Bradycardia, unspecified: Secondary | ICD-10-CM | POA: Diagnosis not present

## 2017-12-30 DIAGNOSIS — I5032 Chronic diastolic (congestive) heart failure: Secondary | ICD-10-CM | POA: Diagnosis present

## 2017-12-30 DIAGNOSIS — I2510 Atherosclerotic heart disease of native coronary artery without angina pectoris: Secondary | ICD-10-CM | POA: Diagnosis not present

## 2017-12-30 DIAGNOSIS — R278 Other lack of coordination: Secondary | ICD-10-CM | POA: Diagnosis not present

## 2017-12-30 DIAGNOSIS — E1122 Type 2 diabetes mellitus with diabetic chronic kidney disease: Secondary | ICD-10-CM | POA: Diagnosis present

## 2017-12-30 DIAGNOSIS — I4891 Unspecified atrial fibrillation: Secondary | ICD-10-CM | POA: Diagnosis not present

## 2017-12-30 DIAGNOSIS — Z6841 Body Mass Index (BMI) 40.0 and over, adult: Secondary | ICD-10-CM | POA: Diagnosis not present

## 2017-12-30 DIAGNOSIS — R5381 Other malaise: Secondary | ICD-10-CM | POA: Diagnosis not present

## 2017-12-30 DIAGNOSIS — M869 Osteomyelitis, unspecified: Secondary | ICD-10-CM | POA: Diagnosis not present

## 2017-12-30 DIAGNOSIS — J449 Chronic obstructive pulmonary disease, unspecified: Secondary | ICD-10-CM | POA: Diagnosis not present

## 2017-12-30 DIAGNOSIS — M8660 Other chronic osteomyelitis, unspecified site: Secondary | ICD-10-CM | POA: Diagnosis not present

## 2017-12-30 DIAGNOSIS — M868X5 Other osteomyelitis, thigh: Secondary | ICD-10-CM | POA: Diagnosis not present

## 2017-12-30 DIAGNOSIS — G894 Chronic pain syndrome: Secondary | ICD-10-CM | POA: Diagnosis present

## 2017-12-30 DIAGNOSIS — Z9889 Other specified postprocedural states: Secondary | ICD-10-CM | POA: Diagnosis not present

## 2018-01-01 ENCOUNTER — Telehealth: Payer: Self-pay | Admitting: *Deleted

## 2018-01-01 NOTE — Telephone Encounter (Signed)
Pt states that he did have surgery on April 29th and he is still in the hospital [facility] and he thinks will go to Rehab upon discharge Pt states he is not discharged today so instructed to call us when he is discharged and tell us what Rehab center he will be going to so we can check to be sure they will check INR and dose coumadin while he is a pt there and he states will do so

## 2018-01-03 DIAGNOSIS — I2510 Atherosclerotic heart disease of native coronary artery without angina pectoris: Secondary | ICD-10-CM | POA: Diagnosis not present

## 2018-01-03 DIAGNOSIS — L02416 Cutaneous abscess of left lower limb: Secondary | ICD-10-CM | POA: Diagnosis not present

## 2018-01-03 DIAGNOSIS — I499 Cardiac arrhythmia, unspecified: Secondary | ICD-10-CM | POA: Diagnosis not present

## 2018-01-03 DIAGNOSIS — M109 Gout, unspecified: Secondary | ICD-10-CM | POA: Diagnosis not present

## 2018-01-03 DIAGNOSIS — K219 Gastro-esophageal reflux disease without esophagitis: Secondary | ICD-10-CM | POA: Diagnosis not present

## 2018-01-03 DIAGNOSIS — M86662 Other chronic osteomyelitis, left tibia and fibula: Secondary | ICD-10-CM | POA: Diagnosis not present

## 2018-01-03 DIAGNOSIS — Z452 Encounter for adjustment and management of vascular access device: Secondary | ICD-10-CM | POA: Diagnosis not present

## 2018-01-03 DIAGNOSIS — R609 Edema, unspecified: Secondary | ICD-10-CM | POA: Diagnosis not present

## 2018-01-03 DIAGNOSIS — E119 Type 2 diabetes mellitus without complications: Secondary | ICD-10-CM | POA: Diagnosis not present

## 2018-01-03 DIAGNOSIS — M6281 Muscle weakness (generalized): Secondary | ICD-10-CM | POA: Diagnosis not present

## 2018-01-03 DIAGNOSIS — J449 Chronic obstructive pulmonary disease, unspecified: Secondary | ICD-10-CM | POA: Diagnosis not present

## 2018-01-03 DIAGNOSIS — Z9889 Other specified postprocedural states: Secondary | ICD-10-CM | POA: Diagnosis not present

## 2018-01-03 DIAGNOSIS — R5381 Other malaise: Secondary | ICD-10-CM | POA: Diagnosis not present

## 2018-01-03 DIAGNOSIS — E785 Hyperlipidemia, unspecified: Secondary | ICD-10-CM | POA: Diagnosis not present

## 2018-01-03 DIAGNOSIS — M86152 Other acute osteomyelitis, left femur: Secondary | ICD-10-CM | POA: Diagnosis not present

## 2018-01-03 DIAGNOSIS — R278 Other lack of coordination: Secondary | ICD-10-CM | POA: Diagnosis not present

## 2018-01-03 DIAGNOSIS — R5383 Other fatigue: Secondary | ICD-10-CM | POA: Diagnosis not present

## 2018-01-03 DIAGNOSIS — Z79899 Other long term (current) drug therapy: Secondary | ICD-10-CM | POA: Diagnosis not present

## 2018-01-03 DIAGNOSIS — I10 Essential (primary) hypertension: Secondary | ICD-10-CM | POA: Diagnosis not present

## 2018-01-03 DIAGNOSIS — R600 Localized edema: Secondary | ICD-10-CM | POA: Diagnosis not present

## 2018-01-03 DIAGNOSIS — R279 Unspecified lack of coordination: Secondary | ICD-10-CM | POA: Diagnosis not present

## 2018-01-03 DIAGNOSIS — R52 Pain, unspecified: Secondary | ICD-10-CM | POA: Diagnosis not present

## 2018-01-03 DIAGNOSIS — I4891 Unspecified atrial fibrillation: Secondary | ICD-10-CM | POA: Diagnosis not present

## 2018-01-03 DIAGNOSIS — H409 Unspecified glaucoma: Secondary | ICD-10-CM | POA: Diagnosis not present

## 2018-01-03 DIAGNOSIS — M868X5 Other osteomyelitis, thigh: Secondary | ICD-10-CM | POA: Diagnosis not present

## 2018-01-10 ENCOUNTER — Ambulatory Visit: Payer: Medicare Other | Admitting: Internal Medicine

## 2018-01-17 DIAGNOSIS — I2510 Atherosclerotic heart disease of native coronary artery without angina pectoris: Secondary | ICD-10-CM | POA: Diagnosis not present

## 2018-01-17 DIAGNOSIS — I4891 Unspecified atrial fibrillation: Secondary | ICD-10-CM | POA: Diagnosis not present

## 2018-01-17 DIAGNOSIS — J449 Chronic obstructive pulmonary disease, unspecified: Secondary | ICD-10-CM | POA: Diagnosis not present

## 2018-01-17 DIAGNOSIS — M868X5 Other osteomyelitis, thigh: Secondary | ICD-10-CM | POA: Diagnosis not present

## 2018-01-17 DIAGNOSIS — Z9889 Other specified postprocedural states: Secondary | ICD-10-CM | POA: Diagnosis not present

## 2018-01-17 DIAGNOSIS — R52 Pain, unspecified: Secondary | ICD-10-CM | POA: Diagnosis not present

## 2018-01-17 DIAGNOSIS — E119 Type 2 diabetes mellitus without complications: Secondary | ICD-10-CM | POA: Diagnosis not present

## 2018-01-23 DIAGNOSIS — M868X5 Other osteomyelitis, thigh: Secondary | ICD-10-CM | POA: Diagnosis not present

## 2018-01-23 DIAGNOSIS — R5383 Other fatigue: Secondary | ICD-10-CM | POA: Diagnosis not present

## 2018-01-23 DIAGNOSIS — R600 Localized edema: Secondary | ICD-10-CM | POA: Diagnosis not present

## 2018-01-28 ENCOUNTER — Telehealth: Payer: Self-pay | Admitting: *Deleted

## 2018-01-28 NOTE — Telephone Encounter (Signed)
Received a voicemail from the pt that he was at [facility] and to call them regarding his INR and Warfarin management. [facility] and spoke with Kia and she stated they do follow his INR and they dose his Warfarin.  She stated INR was 4.5 and they are holding for two days then resuming if MD instructs to do so. Therefore, advised to call once pt is discharged from their facility so we can make an appt for the pt at our practice and she stated they would.

## 2018-02-06 DIAGNOSIS — Z9889 Other specified postprocedural states: Secondary | ICD-10-CM | POA: Diagnosis not present

## 2018-02-06 DIAGNOSIS — J449 Chronic obstructive pulmonary disease, unspecified: Secondary | ICD-10-CM | POA: Diagnosis not present

## 2018-02-06 DIAGNOSIS — I2510 Atherosclerotic heart disease of native coronary artery without angina pectoris: Secondary | ICD-10-CM | POA: Diagnosis not present

## 2018-02-06 DIAGNOSIS — L02416 Cutaneous abscess of left lower limb: Secondary | ICD-10-CM | POA: Diagnosis not present

## 2018-02-06 DIAGNOSIS — M86662 Other chronic osteomyelitis, left tibia and fibula: Secondary | ICD-10-CM | POA: Diagnosis not present

## 2018-02-06 DIAGNOSIS — E119 Type 2 diabetes mellitus without complications: Secondary | ICD-10-CM | POA: Diagnosis not present

## 2018-02-06 DIAGNOSIS — I4891 Unspecified atrial fibrillation: Secondary | ICD-10-CM | POA: Diagnosis not present

## 2018-02-10 ENCOUNTER — Encounter: Payer: Self-pay | Admitting: *Deleted

## 2018-02-12 ENCOUNTER — Other Ambulatory Visit: Payer: Self-pay | Admitting: *Deleted

## 2018-02-12 DIAGNOSIS — G894 Chronic pain syndrome: Secondary | ICD-10-CM

## 2018-02-12 NOTE — Telephone Encounter (Signed)
Call from patient stating he picked up his last percocet rx on 06/10 (confirmed with pharmacy).  Pt attempted to schedule an appt with pcp-but none is currently available at this time and so he requests appt with [facility] to "check his leg" s/p "surgery".  He is also requesting refill request be sent to pcp so he will be able to pick up on 7/10. Pt will be scheduled to see pcp as soon as schedule becomes available around Sept.[place]ston, Darlene Cassady6/12/201910:08 AM

## 2018-02-13 MED ORDER — OXYCODONE-ACETAMINOPHEN 10-325 MG PO TABS: 1.0000 | ORAL_TABLET | Freq: Four times a day (QID) | ORAL | 0 refills | Status: DC | PRN

## 2018-02-20 ENCOUNTER — Ambulatory Visit: Payer: Medicare Other

## 2018-02-25 ENCOUNTER — Encounter: Payer: Self-pay | Admitting: Internal Medicine

## 2018-02-25 ENCOUNTER — Ambulatory Visit (INDEPENDENT_AMBULATORY_CARE_PROVIDER_SITE_OTHER): Payer: Medicare Other | Admitting: Internal Medicine

## 2018-02-25 ENCOUNTER — Other Ambulatory Visit: Payer: Self-pay

## 2018-02-25 VITALS — BP 118/65 | HR 58 | Temp 97.4°F | Ht 72.0 in | Wt 290.4 lb

## 2018-02-25 DIAGNOSIS — M86652 Other chronic osteomyelitis, left thigh: Secondary | ICD-10-CM | POA: Diagnosis not present

## 2018-02-25 DIAGNOSIS — E1140 Type 2 diabetes mellitus with diabetic neuropathy, unspecified: Secondary | ICD-10-CM | POA: Diagnosis not present

## 2018-02-25 DIAGNOSIS — N521 Erectile dysfunction due to diseases classified elsewhere: Secondary | ICD-10-CM

## 2018-02-25 LAB — POCT GLYCOSYLATED HEMOGLOBIN (HGB A1C): HEMOGLOBIN A1C: 6.3 % — AB (ref 4.0–5.6)

## 2018-02-25 LAB — GLUCOSE, CAPILLARY: Glucose-Capillary: 155 mg/dL — ABNORMAL HIGH (ref 70–99)

## 2018-02-25 NOTE — Assessment & Plan Note (Addendum)
His last A1c was 6.9 on 11/22/2017.  He is taking metformin 1000 mg twice daily and reports adherence intolerance. A/P: Repeat A1c today 6.3.  His diabetes is well controlled.  We will have him continue his current metformin at 1000 mg twice daily and follow-up in 3 months.

## 2018-02-25 NOTE — Patient Instructions (Signed)
It was a pleasure to see Juan Calderon.  Your pain medicine was refilled by Dr. Eppie Gibson and should be ready for pickup 03/12/2018.  Please continue your other medications as prescribed and follow-up with the infectious disease and orthopedic surgeons as scheduled.  Please schedule follow-up with Dr. Eppie Gibson for next available appointment in about 3 months or see sooner if needed.

## 2018-02-25 NOTE — Progress Notes (Signed)
CC: Diabetes  HPI:  [name] is a 62 y.o. male with PMH as listed below including CAD, diastolic CHF, HTN, M1DQ, paroxysmal A. fib on Coumadin, gout, GERD, OSA, and chronic pain secondary to chronic osteomyelitis of the left femur (s/p I&D with corticotomy and antibiotic bead placement 12/30/2017) who presents for follow-up management of his diabetes.  Please see problem based charting for status of patient's chronic medical issues.  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 (HCC) His last A1c was 6.9 on 11/22/2017.  He is taking metformin 1000 mg twice daily and reports adherence intolerance. A/P: Repeat A1c today 6.3.  His diabetes is well controlled.  We will have him continue his current metformin at 1000 mg twice daily and follow-up in 3 months.  Chronic osteomyelitis of left femur (HCC) Patient has chronic osteomyelitis with Brodie's abscess of the left femur after a traumatic injury.  He has had chronic pain associated with this condition.  He underwent partial excision encouraged which left femoral osteomyelitis at [facility] on 12/30/2017.  Cultures of intraoperative purulent material grew MSSA.  He was treated with 6 weeks of IV cefazolin through 02/10/2018 via PICC line.  He is now on oral Keflex 500 4 times daily at least until he follows up with his infectious disease specialist at [facility] on 03/13/2018.  Patient states he has been doing fairly well since his surgical procedure and is tolerating his current oral antibiotics well.  He still having continued pain and requesting a refill of his pain medications at this time. A/P: Patient with Brodie's abscess/chronic osteomyelitis of the left femur status post excision and curettage at [facility].  He has completed 6 weeks of IV cefazolin therapy and is now on oral Keflex until ID follow-up in July.  His surgical site is healing well without any obvious sign of superficial infection or abscess.  I recommended he continue  his current medications as prescribed.  I informed him that his pain medications were refilled by his PCP with first available pickup on 03/12/2018.  He will follow-up with ID and orthopedics as scheduled next month with [place] again in about 3 months.    Past Medical History:  Diagnosis Date  . Alcohol abuse    . C6 radiculopathy 01/24/2016   Right upper extremity, mild to moderate electrically by EMG on 01/24/2016  . Cataract    Left eye  . Chronic diastolic heart failure ([place])     with mild left ventricular hypertrophy on Echo 02/2010  . Chronic obstructive pulmonary disease ([place])    . Chronic osteomyelitis of femur ([place]) 04/06/2016  . Chronic osteomyelitis of left femur ([place]) 11/22/2017   Left femur s/p prior trauma  . Chronic pain syndrome     Left arm and leg s/p traumatic injury   . Chronic renal insufficiency    . Coronary artery disease     25% LAD stenosis on cath 2007.  Stable angina.  . Diverticulosis    . Essential hypertension    . Gastroesophageal reflux disease    . Gout    . Hyperlipidemia LDL goal < 100    . Long-term current use of opiate analgesic 09/07/2016  . Mild carpal tunnel syndrome of right wrist 01/24/2016   Mild degree electrically per EMG 01/24/2016   . Morbid obesity with BMI of 40.0-44.9, adult ([place])    . Normocytic anemia    . Obstructive sleep apnea     Moderate, AHI 29.8 per hour with moderately  loud snoring and oxygen desaturation to a nadir of 79%. CPAP titration resulted in a prescription for 17 CWP.    [name] Open-angle glaucoma    . Osteoarthritis cervical spine    . Osteoarthritis of left knee 06/19/2013   Tricompartmental disease.  Treated with double hinged upright knee brace, steroid/xylocaine knee injections, and NSAIDs   . Osteoporosis 05/14/2017   s/p fracture of the right humerus from a fall at ground hight  . Paroxysmal atrial fibrillation ([place]) 10/25/2015  . Persistent atrial fibrillation ([place])    . Right rotator cuff tear     Large  full-thickness tear of the supraspinatus with mild retraction but no atrophy   . Secondary male hypogonadism 02/07/2017   Likely secondary to chronic opioid use  . Subclinical hypothyroidism    . 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 (HCC)     Review of Systems:   Review of Systems  Constitutional: Negative for chills, diaphoresis and fever.  HENT: Negative for nosebleeds.   Respiratory: Negative for hemoptysis and shortness of breath.   Cardiovascular: Negative for chest pain and palpitations.  Gastrointestinal: Negative for blood in stool and melena.  Genitourinary: Negative for hematuria.  Musculoskeletal: Positive for joint pain. Negative for falls.       Left hip and femur pain  Skin:       No swelling, erythema, tenderness, or discharge from surgical wound site  Neurological: Negative for loss of consciousness.     Physical Exam:  Vitals:   02/25/18 1541  BP: 118/65  Pulse: (!) 58  Temp: (!) 97.4 F (36.3 C)  TempSrc: Oral  SpO2: 98%  Weight: 290 lb 6.4 oz (131.7 kg)  Height: 6' (1.829 m)   Physical Exam  Constitutional: He is oriented to person, place, and time. No distress.  HENT:  Head: Normocephalic and atraumatic.  Cardiovascular: Normal rate and regular rhythm.  No murmur heard. Pulmonary/Chest: Effort normal. No respiratory distress. He has no wheezes. He has no rales.  Musculoskeletal:  Antalgic gait  Neurological: He is alert and oriented to person, place, and time.  Skin: Skin is warm. He is not diaphoretic.  Left anterior lateral thigh surgical wound site well, clean, dry, intact without erythema, fluctuance, tenderness to palpation, discharge, or open skin wound     Assessment & Plan:   See Encounters Tab for problem based charting.  Patient discussed with Dr. Angelia Mould

## 2018-02-25 NOTE — Assessment & Plan Note (Signed)
Patient has chronic osteomyelitis with Brodie's abscess of the left femur after a traumatic injury.  He has had chronic pain associated with this condition.  He underwent partial excision encouraged which left femoral osteomyelitis at [facility] on 12/30/2017.  Cultures of intraoperative purulent material grew MSSA.  He was treated with 6 weeks of IV cefazolin through 02/10/2018 via PICC line.  He is now on oral Keflex 500 4 times daily at least until he follows up with his infectious disease specialist at [facility] on 03/13/2018.  Patient states he has been doing fairly well since his surgical procedure and is tolerating his current oral antibiotics well.  He still having continued pain and requesting a refill of his pain medications at this time. A/P: Patient with Brodie's abscess/chronic osteomyelitis of the left femur status post excision and curettage at [facility].  He has completed 6 weeks of IV cefazolin therapy and is now on oral Keflex until ID follow-up in July.  His surgical site is healing well without any obvious sign of superficial infection or abscess.  I recommended he continue his current medications as prescribed.  I informed him that his pain medications were refilled by his PCP with first available pickup on 03/12/2018.  He will follow-up with ID and orthopedics as scheduled next month with [place] again in about 3 months.

## 2018-02-26 NOTE — Progress Notes (Signed)
Internal Medicine Clinic Attending  Case discussed with Dr. Patel at the time of the visit.  We reviewed the resident's history and exam and pertinent patient test results.  I agree with the assessment, diagnosis, and plan of care documented in the resident's note.  

## 2018-02-27 ENCOUNTER — Ambulatory Visit (INDEPENDENT_AMBULATORY_CARE_PROVIDER_SITE_OTHER): Payer: Medicare Other | Admitting: *Deleted

## 2018-02-27 DIAGNOSIS — Z5181 Encounter for therapeutic drug level monitoring: Secondary | ICD-10-CM

## 2018-02-27 LAB — POCT INR: INR: 2.7 (ref 2.0–3.0)

## 2018-02-27 NOTE — Patient Instructions (Signed)
Description   Continue taking 1 tablet everyday except 1/2 tablet on Mondays, Wednesdays and Fridays. Recheck INR in 4 weeks. Coumadin clinic [phone] with concerns.

## 2018-02-28 ENCOUNTER — Other Ambulatory Visit: Payer: Self-pay | Admitting: Internal Medicine

## 2018-02-28 DIAGNOSIS — I4891 Unspecified atrial fibrillation: Secondary | ICD-10-CM

## 2018-03-04 ENCOUNTER — Other Ambulatory Visit: Payer: Self-pay | Admitting: Internal Medicine

## 2018-03-04 DIAGNOSIS — I5032 Chronic diastolic (congestive) heart failure: Secondary | ICD-10-CM

## 2018-03-10 ENCOUNTER — Encounter: Payer: Self-pay | Admitting: Physician Assistant

## 2018-03-12 NOTE — Addendum Note (Signed)
Addended by: Hulan Fray on: 03/12/2018 06:16 PM   Modules accepted: Orders

## 2018-03-13 ENCOUNTER — Encounter: Payer: Self-pay | Admitting: Internal Medicine

## 2018-03-13 DIAGNOSIS — M8660 Other chronic osteomyelitis, unspecified site: Secondary | ICD-10-CM | POA: Diagnosis not present

## 2018-03-18 ENCOUNTER — Other Ambulatory Visit: Payer: Self-pay | Admitting: Internal Medicine

## 2018-03-18 DIAGNOSIS — M1A00X0 Idiopathic chronic gout, unspecified site, without tophus (tophi): Secondary | ICD-10-CM

## 2018-03-24 ENCOUNTER — Encounter: Payer: Self-pay | Admitting: Physician Assistant

## 2018-03-24 NOTE — Progress Notes (Signed)
Cardiology Office Note    Date:  03/25/2018  ID:  Juan Calderon, DOB [birth year], MRN [id number] PCP:  Oval Linsey, MD  Cardiologist:  Lauree Chandler, MD   Chief Complaint: f/u CAD, chronic CHF  History of Present Illness:  Juan Calderon is a 62 y.o. male with history of chronic diastolic CHF, CAD (non-obstructive by cath in 2007), persistent AFib, bradycardia, frequent PVCs, NSVT, COPD, HTN, HLD, gout, GERD, DM, chronic venous stasis (PMD managing), morbid obesity, alcohol use, chronic dyspnea presents for overdue general cardiology follow-up.  He has complex PMH and was previously followed by Dr. Angelena Form (last seen in 2016) but more recently by Dr. [name] and EP APP team. Per notes, he had remotely been followed by Dr. [name] [facility] in [place], [place]. He had a stress myoview 09/14/05 that showed reversible ischemia in the inferior wall which led to a cardiac cath on 10/01/05 which showed 25% mid LAD stenosis per report but no other evidence of CAD. Monitor April 2012 in [place], [place] showed NSR with episodes of sinus brady, rare PVCs, occasional PACs and brief episodes of atrial tachycardia. Per records he has been on Multaq in the past but did not like this medication so it was stopped. In 2015 he was admitted with diastolic CHF and found to have bradycardia so beta blocker was reduced and digoxin was stopped. Stress myoview 11/25/14 done for dyspnea on exertion showed no ischemia. 24 hour monitor showed atrial fib with bradycardia (rates as low as 40 bpm), frequent PVCs (9000 in 24 hours) and non-sustained VT. He was seen by Dr. [name] 12/23/14 and was started on Flecainide. He underwent DCCV on 12/30/14. Last echo 01/2015 showed EF 55-60%, mild LVH, PASP 95mHg. He tried CPAP but did not tolerate. He had a fall in 2018 with arm fracture. His last cardiology visit was with RTommye Standard1/2019 - maintaining NSR, overall euvolemic and doing OK. Last pertinent labs  11/2017 - Cr 0.99, LFTs OK, K 4.0, Cr 0.99, glucose 155, Hgb 12.3, Plt 193.  Earlier this spring he was admitted with osteomyelitis with ID noting indicating possibly chronic even back many decades as he had sustained an old injury to this leg. He has not had any recent chest pain. He has chronic DOE and LEE but states this is actually a little better lately without specific intervention. No recent syncope or falls. It's not clear why he is specifically on warfarin as opposed to NOAC. He reports he has not had any alcohol in 4 months - has significantly reduced his intake.  Past Medical History:  Diagnosis Date  . Alcohol abuse    . Bradycardia   . C6 radiculopathy 01/24/2016   Right upper extremity, mild to moderate electrically by EMG on 01/24/2016  . Cataract    Left eye  . Chronic diastolic heart failure ([place]     with mild left ventricular hypertrophy on Echo 02/2010  . Chronic obstructive pulmonary disease ([place]    . Chronic osteomyelitis of femur ([place] 04/06/2016  . Chronic osteomyelitis of left femur ([place] 11/22/2017   Left femur s/p prior trauma  . Chronic pain syndrome     Left arm and leg s/p traumatic injury   . Chronic renal insufficiency    . Coronary artery disease     25% LAD stenosis on cath 2007.  Stable angina.  . Diverticulosis    . Essential hypertension    . Frequent PVCs   . Gastroesophageal reflux  disease    . Gout    . Hyperlipidemia LDL goal < 100    . Long-term current use of opiate analgesic 09/07/2016  . Mild carpal tunnel syndrome of right wrist 01/24/2016   Mild degree electrically per EMG 01/24/2016   . Morbid obesity with BMI of 40.0-44.9, adult ([place])    . Normocytic anemia    . NSVT (nonsustained ventricular tachycardia) ([place])   . Obstructive sleep apnea     Moderate, AHI 29.8 per hour with moderately loud snoring and oxygen desaturation to a nadir of 79%. CPAP titration resulted in a prescription for 17 CWP.    [name] Open-angle glaucoma    . Osteoarthritis  cervical spine    . Osteoarthritis of left knee 06/19/2013   Tricompartmental disease.  Treated with double hinged upright knee brace, steroid/xylocaine knee injections, and NSAIDs   . Osteoporosis 05/14/2017   s/p fracture of the right humerus from a fall at ground hight  . Persistent atrial fibrillation ([place])   . Right rotator cuff tear     Large full-thickness tear of the supraspinatus with mild retraction but no atrophy   . Secondary male hypogonadism 02/07/2017   Likely secondary to chronic opioid use  . Subclinical hypothyroidism    . 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 [facility])      Past Surgical History:  Procedure Laterality Date  . CARDIOVERSION N/A 12/30/2014   Procedure: CARDIOVERSION;  Surgeon: [name], MD;  Location[facility] ENDOSCOPY;  Service: Cardiovascular;  Laterality: N/A;  . FRACTURE SURGERY Left 1980's   Elbow  . Left arm surgery    . Left leg surgery    . SHOULDER SURGERY     Right    Current Medications: Current Meds  Medication Sig  . albuterol (PROVENTIL HFA;VENTOLIN HFA) 108 (90 Base) MCG/ACT inhaler Inhale 2 puffs into the lungs every 6 (six) hours as needed for wheezing or shortness of breath.  . allopurinol (ZYLOPRIM) 300 MG tablet Take 1 tablet (300 mg total) by mouth daily. Please note the change in the number of tablets to take daily.  [name] atorvastatin (LIPITOR) 20 MG tablet Take 1 tablet (20 mg total) by mouth daily.  . Blood Glucose Monitoring Suppl (ONETOUCH VERIO) w/Device KIT 1 each by Does not apply route daily.  . cephALEXin (KEFLEX) 500 MG capsule Take 500 mg by mouth 4 (four) times daily.  [name] Dextromethorphan-Guaifenesin 20-400 MG/5ML SYRP Take 5 mLs by mouth every 4 (four) hours as needed (cough).  . esomeprazole (NEXIUM) 40 MG capsule Take 1 capsule (40 mg total) by mouth daily at 12 noon.  . flecainide (TAMBOCOR) 100 MG tablet Take 1 tablet (100 mg total) by mouth every 12 (twelve) hours.  [name] glucose blood  (ONETOUCH VERIO) test strip Use as instructed  . latanoprost (XALATAN) 0.005 % ophthalmic solution Place 1 drop into both eyes at bedtime.  . magnesium oxide (MAG-OX) 400 MG tablet Take 2 tablets by mouth 2 (two) times daily.  . metFORMIN (GLUCOPHAGE) 1000 MG tablet Take 1 tablet (1,000 mg total) by mouth 2 (two) times daily with a meal.  . metoprolol tartrate (LOPRESSOR) 25 MG tablet Take 1 tablet (25 mg total) by mouth 2 (two) times daily.  . nitroGLYCERIN (NITROSTAT) 0.4 MG SL tablet Place 0.4 mg under the tongue every 5 (five) minutes as needed for chest pain.  Glory Rosebush DELICA LANCETS 16S MISC Use 1 strip daily  . oxyCODONE (OXYCONTIN) 10 mg 12 hr tablet Take  1 tablet (10 mg total) by mouth every 12 (twelve) hours.  Derrill Memo ON 05/11/2018] oxyCODONE-acetaminophen (PERCOCET) 10-325 MG tablet Take 1 tablet by mouth every 6 (six) hours as needed for pain.  . sildenafil (VIAGRA) 100 MG tablet Take 1 tablet (100 mg total) by mouth daily as needed for erectile dysfunction.  [name] terazosin (HYTRIN) 5 MG capsule Take 1 capsule (5 mg total) by mouth at bedtime.  . torsemide (DEMADEX) 20 MG tablet TAKE 2 TABLETS BY MOUTH 3 TIMES PER WEEK  . valsartan (DIOVAN) 160 MG tablet Take 1 tablet by mouth daily.  [name] warfarin (COUMADIN) 5 MG tablet TAKE AS DIRECTED BY COUMADIN CLINIC   Allergies:   Ramipril and Testosterone   Social History   Socioeconomic History  . Marital status: Widowed    Spouse name: Not on file  . Number of children: Not on file  . Years of education: Not on file  . Highest education level: Not on file  Occupational History  . Not on file  Social Needs  . Financial resource strain: Not on file  . Food insecurity:    Worry: Not on file    Inability: Not on file  . Transportation needs:    Medical: Not on file    Non-medical: Not on file  Tobacco Use  . Smoking status: Never Smoker  . Smokeless tobacco: Never Used  Substance and Sexual Activity  . Alcohol use: Yes  . Drug  use: No  . Sexual activity: Yes  Lifestyle  . Physical activity:    Days per week: Not on file    Minutes per session: Not on file  . Stress: Not on file  Relationships  . Social connections:    Talks on phone: Not on file    Gets together: Not on file    Attends religious service: Not on file    Active member of club or organization: Not on file    Attends meetings of clubs or organizations: Not on file    Relationship status: Not on file  Other Topics Concern  . Not on file  Social History Narrative  . Not on file     Family History:  The patient's family history includes Alzheimer's disease in his father; Early death in his brother; Heart failure in his brother and mother; Hypertension in his brother and sister; Osteoarthritis in his brother; Prostate cancer in his brother. There is no history of Heart attack or Stroke.  ROS:   Please see the history of present illness. No bleeding reported All other systems are reviewed and otherwise negative.    PHYSICAL EXAM:   VS:  BP 124/76   Pulse (!) 56   Ht 6' (1.829 m)   Wt 288 lb (130.6 kg)   SpO2 96%   BMI 39.06 kg/m   BMI: Body mass index is 39.06 kg/m. GEN: Well nourished, well developed AAM in no acute distress HEENT: normocephalic, atraumatic Neck: no JVD, carotid bruits, or masses Cardiac: RRR; no murmurs, rubs, or gallops, trace BLE edema superimposed on large baseline leg habitus; MSK abnormality of LLE r/t prior trauma Respiratory:  clear to auscultation bilaterally, normal work of breathing GI: soft, nontender, nondistended, + BS MS: no deformity or atrophy Skin: warm and dry, no rash Neuro:  Alert and Oriented x 3, Strength and sensation are intact, follows commands Psych: euthymic mood, full affect  Wt Readings from Last 3 Encounters:  03/25/18 288 lb (130.6 kg)  02/25/18 290 lb 6.4 oz (  131.7 kg)  11/22/17 293 lb (132.9 kg)      Studies/Labs Reviewed:   EKG:  EKG was ordered today and personally  reviewed by me and demonstrates sinus bradycardia 56bpm, nospecific TW abnormalities, no acute changes. QTc 443m.   Recent Labs: 08/23/2017: Magnesium 1.6 11/12/2017: ALT 14; BUN 15; Creatinine, Ser 0.99; Hemoglobin 12.3; Platelets 193; Potassium 4.0; Sodium 138   Lipid Panel    Component Value Date/Time   CHOL 108 08/13/2014 1046   TRIG 93 08/13/2014 1046   HDL 55 08/13/2014 1046   CHOLHDL 2.0 08/13/2014 1046   VLDL 19 08/13/2014 1046   LDLCALC 34 08/13/2014 1046    Additional studies/ records that were reviewed today include: Summarized above.   ASSESSMENT & PLAN:   1. Persistent atrial fib - he has been maintained on long-term flecainide by EP. Appears to be clinically be maintaining NSR as well as by EKG today, without interim symptoms. He is doing well on warfarin as well. We did discuss potentially changing to the NOACs as I do not see that this specifically has ever been discussed in prior notes. He has normal renal function and no history of valvular disease. He is satisfied with Coumadin except for the INR checks being a little cumbersome. I'll reach out to Dr. MAngelena Formto see if he has any objection to changing to Eliquis. Will update CBC, BMET, Mg in case changes are made (has historically low Mg in the past but this was also likely when using EtOH). Of note QRS 1115mwhich is stable from prior, previously 98-112. 2. Bradycardia - quiescent, doing well on metoprolol. Follow. 3. PVCs/NSVT - quiescent by EKG today and no acute symptoms. 4. Chronic diastolic CHF - LEE has previously been felt due to venous stasis as well; maintained on diuretics a few times a week. He reports edema is better than usual; follow. 5. Nonobstructive CAD - no anginal symptoms reported today. This was minimal by cath 2007. Should he ever develop worsening coronary disease, flecainide would need to be reconsidered.  Disposition: F/u with ReTommye StandardA-C/Dr. KlCaryl Comesn 3 months (she had recommended f/u in  6 months at 09/2017 visit but since he is seen today, will push out), and f/u Dr. McAngelena Formn 1 year.   Medication Adjustments/Labs and Tests Ordered: Current medicines are reviewed at length with the patient today.  Concerns regarding medicines are outlined above. Medication changes, Labs and Tests ordered today are summarized above and listed in the Patient Instructions accessible in Encounters.   Signed, DaCharlie PitterPA-C  03/25/2018 11:37 AM    [facility] [address]  [id number]: [phone]: [phone]

## 2018-03-25 ENCOUNTER — Telehealth: Payer: Self-pay | Admitting: Physician Assistant

## 2018-03-25 ENCOUNTER — Ambulatory Visit (INDEPENDENT_AMBULATORY_CARE_PROVIDER_SITE_OTHER): Payer: Medicare Other | Admitting: Physician Assistant

## 2018-03-25 ENCOUNTER — Encounter: Payer: Self-pay | Admitting: Physician Assistant

## 2018-03-25 ENCOUNTER — Ambulatory Visit (INDEPENDENT_AMBULATORY_CARE_PROVIDER_SITE_OTHER): Payer: Medicare Other | Admitting: *Deleted

## 2018-03-25 VITALS — BP 124/76 | HR 56 | Ht 72.0 in | Wt 288.0 lb

## 2018-03-25 DIAGNOSIS — Z8679 Personal history of other diseases of the circulatory system: Secondary | ICD-10-CM | POA: Diagnosis not present

## 2018-03-25 DIAGNOSIS — I4819 Other persistent atrial fibrillation: Secondary | ICD-10-CM

## 2018-03-25 DIAGNOSIS — Z5181 Encounter for therapeutic drug level monitoring: Secondary | ICD-10-CM

## 2018-03-25 DIAGNOSIS — I472 Ventricular tachycardia: Secondary | ICD-10-CM

## 2018-03-25 DIAGNOSIS — I4729 Other ventricular tachycardia: Secondary | ICD-10-CM

## 2018-03-25 DIAGNOSIS — Z87898 Personal history of other specified conditions: Secondary | ICD-10-CM

## 2018-03-25 DIAGNOSIS — I481 Persistent atrial fibrillation: Secondary | ICD-10-CM

## 2018-03-25 DIAGNOSIS — I2510 Atherosclerotic heart disease of native coronary artery without angina pectoris: Secondary | ICD-10-CM | POA: Diagnosis not present

## 2018-03-25 DIAGNOSIS — I5032 Chronic diastolic (congestive) heart failure: Secondary | ICD-10-CM | POA: Diagnosis not present

## 2018-03-25 DIAGNOSIS — I493 Ventricular premature depolarization: Secondary | ICD-10-CM

## 2018-03-25 LAB — POCT INR: INR: 2.3 (ref 2.0–3.0)

## 2018-03-25 NOTE — Patient Instructions (Signed)
Medication Instructions:  Your physician recommends that you continue on your current medications as directed. Please refer to the Current Medication list given to you today.   Labwork: TODAY:  BMET, CBC, & MAGNESIUM  Testing/Procedures: None ordered  Follow-Up: Your physician recommends that you schedule a follow-up appointment in: [place] / DR. [name]  Your physician wants you to follow-up in: [place] DR. Angelena Form   You will receive a reminder letter in the mail two months in advance. If you don't receive a letter, please call our office to schedule the follow-up appointment.    Any Other Special Instructions Will Be Listed Below (If Applicable).     If you need a refill on your cardiac medications before your next appointment, please call your pharmacy.

## 2018-03-25 NOTE — Addendum Note (Signed)
Addended by: Gaetano Net on: 03/25/2018 12:08 PM   Modules accepted: Orders

## 2018-03-25 NOTE — Patient Instructions (Signed)
Description   Continue taking 1 tablet everyday except 1/2 tablet on Mondays, Wednesdays and Fridays. Recheck INR in 6 weeks. Coumadin clinic [phone] with concerns or medication changes.

## 2018-03-26 LAB — CBC
Hematocrit: 39.7 % (ref 37.5–51.0)
Hemoglobin: 12.7 g/dL — ABNORMAL LOW (ref 13.0–17.7)
MCH: 29.4 pg (ref 26.6–33.0)
MCHC: 32 g/dL (ref 31.5–35.7)
MCV: 92 fL (ref 79–97)
PLATELETS: 189 10*3/uL (ref 150–450)
RBC: 4.32 x10E6/uL (ref 4.14–5.80)
RDW: 18.3 % — ABNORMAL HIGH (ref 12.3–15.4)
WBC: 6.2 10*3/uL (ref 3.4–10.8)

## 2018-03-26 LAB — BASIC METABOLIC PANEL
BUN/Creatinine Ratio: 15 (ref 10–24)
BUN: 16 mg/dL (ref 8–27)
CALCIUM: 9.8 mg/dL (ref 8.6–10.2)
CO2: 23 mmol/L (ref 20–29)
Chloride: 102 mmol/L (ref 96–106)
Creatinine, Ser: 1.04 mg/dL (ref 0.76–1.27)
GFR calc Af Amer: 88 mL/min/{1.73_m2} (ref >59)
GFR, EST NON AFRICAN AMERICAN: 76 mL/min/{1.73_m2} (ref >59)
Glucose: 91 mg/dL (ref 65–99)
POTASSIUM: 4.8 mmol/L (ref 3.5–5.2)
Sodium: 141 mmol/L (ref 134–144)

## 2018-03-26 LAB — MAGNESIUM: Magnesium: 1.7 mg/dL (ref 1.6–2.3)

## 2018-03-26 NOTE — Telephone Encounter (Addendum)
See result note regarding NOAC. Garnell Phenix PA-C

## 2018-04-02 ENCOUNTER — Telehealth: Payer: Self-pay | Admitting: Internal Medicine

## 2018-04-02 DIAGNOSIS — H04123 Dry eye syndrome of bilateral lacrimal glands: Secondary | ICD-10-CM | POA: Diagnosis not present

## 2018-04-02 DIAGNOSIS — H25813 Combined forms of age-related cataract, bilateral: Secondary | ICD-10-CM | POA: Diagnosis not present

## 2018-04-02 DIAGNOSIS — H40013 Open angle with borderline findings, low risk, bilateral: Secondary | ICD-10-CM | POA: Diagnosis not present

## 2018-04-02 LAB — HM DIABETES EYE EXAM

## 2018-04-02 NOTE — Telephone Encounter (Signed)
Let pt know all his labs were good on 7/23. If his other doctors want to see them they can be seen in the computer system.

## 2018-04-02 NOTE — Telephone Encounter (Signed)
Pt would like Dr Eppie Gibson to call, pt heart dr would like him to look at his results on 07/23. Pt contact# [phone]

## 2018-04-04 NOTE — Telephone Encounter (Signed)
Called pt to let him know labs were good on 7/23 per Dr Beryle Beams. Stated he was told by someone at  his heart doctor "something was low and needed attention". Informed Magnesium was 1.7; I asked about taking his Mag Oxide - stated he takes it 2 tabs twice a day. And they plan to take him off Warfarin. Told him I will inform Dr Eppie Gibson.

## 2018-04-04 NOTE — Telephone Encounter (Signed)
Thank you Dr. Eppie Gibson!

## 2018-04-04 NOTE — Telephone Encounter (Signed)
Thank you for the information.  Mg stable at 1.7.  I am aware of the issue with possible decreased magnesium levels in patients taking PPI.  Please see my extensive notes on this issue under hypomagnesemia.  We tried a trial of high dose H2 blockers and his GERD symptoms remained significant.  We therefore decided to max out the oral magnesium and restart his PPI given his symptoms.  His magnesium has been stable since, with this combination.

## 2018-04-14 ENCOUNTER — Encounter: Payer: Self-pay | Admitting: *Deleted

## 2018-04-28 ENCOUNTER — Other Ambulatory Visit: Payer: Self-pay | Admitting: Internal Medicine

## 2018-04-28 DIAGNOSIS — I10 Essential (primary) hypertension: Secondary | ICD-10-CM

## 2018-05-06 ENCOUNTER — Ambulatory Visit (INDEPENDENT_AMBULATORY_CARE_PROVIDER_SITE_OTHER): Payer: Medicare Other

## 2018-05-06 DIAGNOSIS — Z5181 Encounter for therapeutic drug level monitoring: Secondary | ICD-10-CM | POA: Diagnosis not present

## 2018-05-06 DIAGNOSIS — I480 Paroxysmal atrial fibrillation: Secondary | ICD-10-CM | POA: Diagnosis not present

## 2018-05-06 LAB — POCT INR: INR: 3 (ref 2.0–3.0)

## 2018-05-06 NOTE — Patient Instructions (Signed)
Description   Continue taking 1 tablet everyday except 1/2 tablet on Mondays, Wednesdays and Fridays. Recheck INR in 6 weeks. Per Dr Angelena Form ok to switch to Eliquis 5mg  twice daily.  Pt will check with pharmacy/insurance regarding price of Eliquis then will let us know if he wishes to be transitioned off Coumadin and onto Eliquis.  Coumadin clinic [phone] with concerns or medication changes.

## 2018-05-07 ENCOUNTER — Other Ambulatory Visit: Payer: Self-pay | Admitting: Internal Medicine

## 2018-05-07 DIAGNOSIS — I25119 Atherosclerotic heart disease of native coronary artery with unspecified angina pectoris: Secondary | ICD-10-CM

## 2018-05-08 NOTE — Telephone Encounter (Signed)
Appt has been sch on 06/27/2018 with PCP.

## 2018-05-14 ENCOUNTER — Ambulatory Visit: Payer: Medicare Other | Admitting: Internal Medicine

## 2018-05-29 ENCOUNTER — Other Ambulatory Visit: Payer: Self-pay | Admitting: Cardiovascular Disease

## 2018-05-29 DIAGNOSIS — I4891 Unspecified atrial fibrillation: Secondary | ICD-10-CM

## 2018-05-31 ENCOUNTER — Other Ambulatory Visit: Payer: Self-pay | Admitting: Internal Medicine

## 2018-05-31 DIAGNOSIS — I25119 Atherosclerotic heart disease of native coronary artery with unspecified angina pectoris: Secondary | ICD-10-CM

## 2018-06-02 NOTE — Telephone Encounter (Signed)
Next appt scheduled 10/11 with PCP.

## 2018-06-13 ENCOUNTER — Ambulatory Visit (INDEPENDENT_AMBULATORY_CARE_PROVIDER_SITE_OTHER): Payer: Medicare Other | Admitting: Internal Medicine

## 2018-06-13 VITALS — BP 112/69 | HR 55 | Temp 98.1°F | Wt 302.3 lb

## 2018-06-13 DIAGNOSIS — K219 Gastro-esophageal reflux disease without esophagitis: Secondary | ICD-10-CM | POA: Diagnosis not present

## 2018-06-13 DIAGNOSIS — E1140 Type 2 diabetes mellitus with diabetic neuropathy, unspecified: Secondary | ICD-10-CM

## 2018-06-13 DIAGNOSIS — Z7901 Long term (current) use of anticoagulants: Secondary | ICD-10-CM | POA: Diagnosis not present

## 2018-06-13 DIAGNOSIS — I480 Paroxysmal atrial fibrillation: Secondary | ICD-10-CM

## 2018-06-13 DIAGNOSIS — Z79899 Other long term (current) drug therapy: Secondary | ICD-10-CM | POA: Diagnosis not present

## 2018-06-13 DIAGNOSIS — J449 Chronic obstructive pulmonary disease, unspecified: Secondary | ICD-10-CM | POA: Diagnosis not present

## 2018-06-13 DIAGNOSIS — Z23 Encounter for immunization: Secondary | ICD-10-CM

## 2018-06-13 DIAGNOSIS — E1169 Type 2 diabetes mellitus with other specified complication: Secondary | ICD-10-CM | POA: Diagnosis not present

## 2018-06-13 DIAGNOSIS — T402X5S Adverse effect of other opioids, sequela: Secondary | ICD-10-CM

## 2018-06-13 DIAGNOSIS — M86652 Other chronic osteomyelitis, left thigh: Secondary | ICD-10-CM | POA: Diagnosis not present

## 2018-06-13 DIAGNOSIS — G894 Chronic pain syndrome: Secondary | ICD-10-CM

## 2018-06-13 DIAGNOSIS — I25119 Atherosclerotic heart disease of native coronary artery with unspecified angina pectoris: Secondary | ICD-10-CM

## 2018-06-13 DIAGNOSIS — I25118 Atherosclerotic heart disease of native coronary artery with other forms of angina pectoris: Secondary | ICD-10-CM | POA: Diagnosis not present

## 2018-06-13 DIAGNOSIS — I5032 Chronic diastolic (congestive) heart failure: Secondary | ICD-10-CM | POA: Diagnosis not present

## 2018-06-13 DIAGNOSIS — Z87828 Personal history of other (healed) physical injury and trauma: Secondary | ICD-10-CM

## 2018-06-13 DIAGNOSIS — R635 Abnormal weight gain: Secondary | ICD-10-CM | POA: Diagnosis not present

## 2018-06-13 DIAGNOSIS — N529 Male erectile dysfunction, unspecified: Secondary | ICD-10-CM

## 2018-06-13 DIAGNOSIS — Z79891 Long term (current) use of opiate analgesic: Secondary | ICD-10-CM

## 2018-06-13 DIAGNOSIS — N521 Erectile dysfunction due to diseases classified elsewhere: Secondary | ICD-10-CM

## 2018-06-13 DIAGNOSIS — T402X5A Adverse effect of other opioids, initial encounter: Secondary | ICD-10-CM

## 2018-06-13 DIAGNOSIS — I110 Hypertensive heart disease with heart failure: Secondary | ICD-10-CM

## 2018-06-13 DIAGNOSIS — Z7984 Long term (current) use of oral hypoglycemic drugs: Secondary | ICD-10-CM

## 2018-06-13 DIAGNOSIS — Z6841 Body Mass Index (BMI) 40.0 and over, adult: Secondary | ICD-10-CM | POA: Diagnosis not present

## 2018-06-13 DIAGNOSIS — K5903 Drug induced constipation: Secondary | ICD-10-CM | POA: Diagnosis not present

## 2018-06-13 LAB — POCT GLYCOSYLATED HEMOGLOBIN (HGB A1C): HEMOGLOBIN A1C: 6.5 % — AB (ref 4.0–5.6)

## 2018-06-13 LAB — GLUCOSE, CAPILLARY: GLUCOSE-CAPILLARY: 141 mg/dL — AB (ref 70–99)

## 2018-06-13 MED ORDER — OXYCODONE-ACETAMINOPHEN 10-325 MG PO TABS: 1.0000 | ORAL_TABLET | Freq: Four times a day (QID) | ORAL | 0 refills | Status: DC | PRN

## 2018-06-13 NOTE — Assessment & Plan Note (Signed)
Assessment  His constipation is well controlled on as needed sorbitol.  Plan  We will continue with the as needed sorbitol titrated to the desired number of bowel movements.

## 2018-06-13 NOTE — Assessment & Plan Note (Signed)
Assessment  Over the last several days he has noted some mild dyspnea at night.  He has had to use his albuterol rescue inhaler and this has been effective at managing his symptoms.  Plan  He was encouraged to continue the albuterol as needed for dyspnea.  We will reassess his symptoms at the follow-up visit to assure they are not progressive.

## 2018-06-13 NOTE — Assessment & Plan Note (Signed)
Assessment  He continues with the magnesium oxide 800 mg by mouth twice daily.  Unfortunately, he states his insurance will not pay for this medication and he is having to pay out of pocket.  This is surprising given the importance of maintaining his magnesium levels in the normal range in the setting of paroxysmal atrial fibrillation.  Plan  We will try to contact the insurance company to explain the importance of maintaining his magnesium and see if we can get an authorization for the magnesium oxide 800 mg by mouth twice daily.  In the meantime, he will continue to take the magnesium oxide 800 mg by mouth twice daily.

## 2018-06-13 NOTE — Assessment & Plan Note (Signed)
Assessment  He underwent partial excision and curettage of his left femoral osteomyelitis at [facility] in April 2019.  He states the pain may be improved postoperatively.  He has a follow-up appointment within the next month at [facility] to reassess and determine if he requires chronic antibiotic therapy.  Plan  He will follow-up with [facility] as scheduled and we will reassess after we hear their decision on whether or not he requires chronic antibiotic therapy.

## 2018-06-13 NOTE — Assessment & Plan Note (Signed)
Assessment  He denies any recent symptoms of chest pain consistent with angina.  His current regimen includes metoprolol 25 mg by mouth twice daily and sublingual nitroglycerin which he has not required.  Plan  We will continue the metoprolol 25 mg by mouth twice daily and he has sublingual nitroglycerin available should he require it.  We will reassess the efficacy of this regimen in controlling his angina at the follow-up visit.

## 2018-06-13 NOTE — Assessment & Plan Note (Signed)
Assessment  He has not felt any palpitations and on exam and his heart rhythm sounded regular.  Thus, I suspect that he is currently in sinus rhythm.  This is on flecainide 100 mg by mouth twice daily.  He is on metoprolol 25 mg by mouth twice daily should he flip into atrial fibrillation and he is anticoagulated with warfarin.  He states cardiology is likely to switch him to Eliquis in the near future.  Plan  We will continue with the flecainide 100 mg by mouth every 12 hours and metoprolol 25 mg by mouth every 12 hours should he flip back into atrial fibrillation.  His anticoagulation is being managed by cardiology and he is likely to switch to Eliquis in the next few months.  We will reassess the efficacy of this regimen in controlling his paroxysmal atrial fibrillation and maintaining sinus rhythm as best we can determine.

## 2018-06-13 NOTE — Assessment & Plan Note (Signed)
Assessment  He denies any signs or symptoms consistent with decompensation of his chronic diastolic heart failure.  This includes management of his paroxysmal atrial fibrillation with maintenance in normal sinus rhythm, control of his essential hypertension, glycemic control, and high intensity statin therapy.  Plan  We will continue to address all the chronic medical issues that are responsible for his chronic diastolic heart failure including his atrial fibrillation, essential hypertension, hyperlipidemia, and diabetes.  It is quite likely his obstructive sleep apnea is also contributing to his chronic diastolic heart failure amongst other chronic medical issues.

## 2018-06-13 NOTE — Assessment & Plan Note (Signed)
Assessment  His gastroesophageal reflux symptoms are reasonably well controlled on the esomeprazole 40 mg by mouth daily.  Plan  We will continue the esomeprazole 40 mg by mouth daily and reassess the efficacy of this therapy at the follow-up visit.

## 2018-06-13 NOTE — Patient Instructions (Signed)
It was great to see you again.  You are doing a wonderful job with your diabetes.  1) Keep taking the medications as you are.  2) I refilled your pain medicine.  3) We gave you a flu shot today.  4) I will try to see if I can get your insurance company to cover the magnesium as it is very important that you take it.

## 2018-06-13 NOTE — Assessment & Plan Note (Signed)
He received a flu vaccination today.  He is otherwise up-to-date on his healthcare maintenance.

## 2018-06-13 NOTE — Assessment & Plan Note (Signed)
Assessment  His chronic pain syndrome consists of the left arm and leg pain and left knee pain status post his traumatic injury.  There was also some contribution from his chronic osteomyelitis of the left femur, but this was intervened upon surgically.  He states that pain is not as severe, but his other pain persists.  It has been responsive to the oxycodone-acetaminophen 10-325 mg 1-2 tablets every 6 hours as needed dispense number 100/month.  He recently ran out of this medication and is 2 days overdue for a refill.  Plan  We will continue the oxycodone-acetaminophen 10-325 mg 1 to 2 tablets every 6 hours as needed for severe posttraumatic pain dispense number 100/month.  Prescriptions for the next 3 months were written and electronically submitted to his pharmacy.  We will reassess the efficacy of this therapy at the follow-up visit.

## 2018-06-13 NOTE — Progress Notes (Signed)
   Subjective:    Patient ID: Juan Calderon, male    DOB: 21-Mar-1956, 62 y.o.   MRN: [id number]  HPI  Juan Calderon is here for follow-up of his type 2 diabetes, chronic asthmatic bronchitis, coronary artery disease with stable angina, chronic diastolic heart failure, chronic pain syndrome after traumatic injury, gastroesophageal reflux disease, paroxysmal atrial fibrillation, constipation due to opioid therapy, chronic osteomyelitis of the left femur, and hyponagnesemia. Please see the A&P for the status of the pt's chronic medical problems.  Review of Systems  Constitutional: Positive for unexpected weight change.       Weight up 14 pounds in last 3 months.  Respiratory: Positive for shortness of breath. Negative for chest tightness and wheezing.        Mild shortness of breath over the last several nights that has responded to as needed rescue inhaler.  Cardiovascular: Positive for leg swelling. Negative for chest pain and palpitations.       Chronic leg swelling unchanged.  Gastrointestinal: Negative for abdominal pain, diarrhea, nausea and vomiting.  Genitourinary: Negative for difficulty urinating.  Musculoskeletal: Positive for arthralgias and gait problem. Negative for joint swelling and myalgias.      Objective:   Physical Exam  Constitutional: He is oriented to person, place, and time. He appears well-developed and well-nourished. No distress.  HENT:  Head: Normocephalic and atraumatic.  Eyes: Right eye exhibits no discharge. Left eye exhibits no discharge. No scleral icterus.  Cardiovascular: Normal rate, regular rhythm and normal heart sounds. Exam reveals no gallop and no friction rub.  No murmur heard. Pulmonary/Chest: Effort normal and breath sounds normal. No stridor. No respiratory distress. He has no wheezes. He has no rales.  Musculoskeletal: Normal range of motion. He exhibits edema and deformity.  Neurological: He is alert and oriented to person, place, and time.  He exhibits normal muscle tone.  Skin: Skin is warm and dry. No rash noted. He is not diaphoretic.  Psychiatric: He has a normal mood and affect. His behavior is normal. Judgment and thought content normal.  Nursing note and vitals reviewed.     Assessment & Plan:   Please see problem oriented charting.

## 2018-06-13 NOTE — Assessment & Plan Note (Signed)
Assessment  His glycemic control is excellent with a hemoglobin A1c of 6.5 today.  This is on metformin 1000 mg by mouth twice daily.  Plan  We will continue the metformin 1000 mg by mouth twice daily.  He was praised for his excellent glycemic control.  He is up-to-date on his healthcare maintenance.  We are continuing the as needed Viagra for the erectile dysfunction.

## 2018-06-17 ENCOUNTER — Ambulatory Visit (INDEPENDENT_AMBULATORY_CARE_PROVIDER_SITE_OTHER): Payer: Medicare Other | Admitting: Pharmacist

## 2018-06-17 DIAGNOSIS — Z5181 Encounter for therapeutic drug level monitoring: Secondary | ICD-10-CM | POA: Diagnosis not present

## 2018-06-17 LAB — POCT INR: INR: 2.5 (ref 2.0–3.0)

## 2018-06-17 MED ORDER — APIXABAN 5 MG PO TABS: 5.0000 mg | ORAL_TABLET | Freq: Two times a day (BID) | ORAL | 5 refills | Status: DC

## 2018-06-17 NOTE — Patient Instructions (Signed)
Description   Continue taking 1 tablet everyday except 1/2 tablet on Mondays, Wednesdays and Fridays. Recheck INR in 6 weeks. Per Dr Angelena Form ok to switch to Eliquis 5mg  twice daily.  Pt will check with pharmacy/insurance regarding price of Eliquis then will let us know if he wishes to be transitioned off Coumadin and onto Eliquis.  Coumadin clinic [phone] with concerns or medication changes.

## 2018-06-19 DIAGNOSIS — M86152 Other acute osteomyelitis, left femur: Secondary | ICD-10-CM | POA: Diagnosis not present

## 2018-06-19 DIAGNOSIS — M8660 Other chronic osteomyelitis, unspecified site: Secondary | ICD-10-CM | POA: Diagnosis not present

## 2018-06-23 ENCOUNTER — Ambulatory Visit (INDEPENDENT_AMBULATORY_CARE_PROVIDER_SITE_OTHER): Payer: Medicare Other | Admitting: Internal Medicine

## 2018-06-23 ENCOUNTER — Encounter: Payer: Self-pay | Admitting: Internal Medicine

## 2018-06-23 ENCOUNTER — Telehealth: Payer: Self-pay

## 2018-06-23 VITALS — BP 134/74 | HR 64 | Ht 69.5 in | Wt 305.8 lb

## 2018-06-23 DIAGNOSIS — K219 Gastro-esophageal reflux disease without esophagitis: Secondary | ICD-10-CM | POA: Diagnosis not present

## 2018-06-23 DIAGNOSIS — I2510 Atherosclerotic heart disease of native coronary artery without angina pectoris: Secondary | ICD-10-CM | POA: Diagnosis not present

## 2018-06-23 DIAGNOSIS — Z86010 Personal history of colonic polyps: Secondary | ICD-10-CM | POA: Diagnosis not present

## 2018-06-23 DIAGNOSIS — Z7901 Long term (current) use of anticoagulants: Secondary | ICD-10-CM | POA: Diagnosis not present

## 2018-06-23 MED ORDER — NA SULFATE-K SULFATE-MG SULF 17.5-3.13-1.6 GM/177ML PO SOLN: 1.0000 | Freq: Once | ORAL | 0 refills | Status: AC

## 2018-06-23 NOTE — Patient Instructions (Signed)

## 2018-06-23 NOTE — Telephone Encounter (Signed)
[facility] Pre-operative Risk Assessment     Request for surgical clearance:     Endoscopy Procedure  What type of surgery is being performed?     Colonoscopy  When is this surgery scheduled?     08/12/2018  What type of clearance is required ?   Pharmacy  Are there any medications that need to be held prior to surgery and how long?  Eliquis - 2 days  Practice name and name of physician performing surgery?      [facility]  What is your office phone and fax number?      Phone- [phone]  [phone]  Anesthesia type (None, local, MAC, general) ?       MAC

## 2018-06-23 NOTE — Progress Notes (Signed)
HISTORY OF PRESENT ILLNESS:  Juan Calderon is a 62 y.o. male with multiple medical problems including morbid obesity, sleep apnea, history of congestive heart failure with most recent ejection fraction between 55 and 60%, history of atrial fibrillation (now sinus rhythm) on Eliquis, and diabetes mellitus.  He is sent today by his primary care provider regarding surveillance colonoscopy.  Patient tells me that he had 2 prior colonoscopies in [facility].  He tells me that on the first examination he had "a large polyp".  I do not have that report or pathology but I do have his follow-up examination report dated June 02, 2009.  The examination was performed by Dr. Franchot Erichsen.  The examination was said to be complete with satisfactory bowel prep.  He was noted to have left-sided diverticulosis but no polyps.  No follow-up interval identified on the examination.  Patient tells me that he was having problems with constipation related to pain medication.  However, he has been using sorbitol which helps.  He denies bleeding or family history of colon cancer.  He does have intermittent reflux symptoms for which he takes omeprazole.  For the most part, this works.  His chronic medical problems are stable.  He has had recent cardiology evaluation.  Review of outside blood work from March 25, 2018 finds unremarkable comprehensive metabolic panel and CBC with hemoglobin 12.7.  No relevant x-ray abnormalities noted  REVIEW OF SYSTEMS:  All non-GI ROS negative unless otherwise stated in the HPI except for arthritis, back pain  Past Medical History:  Diagnosis Date  . Alcohol abuse    . Bradycardia   . C6 radiculopathy 01/24/2016   Right upper extremity, mild to moderate electrically by EMG on 01/24/2016  . Cataract    Left eye  . Chronic diastolic heart failure ([place])     with mild left ventricular hypertrophy on Echo 02/2010  . Chronic obstructive pulmonary disease ([place])    . Chronic osteomyelitis  of femur ([place]) 04/06/2016  . Chronic osteomyelitis of left femur ([place]) 11/22/2017   Left femur s/p prior trauma  . Chronic pain syndrome     Left arm and leg s/p traumatic injury   . Chronic renal insufficiency    . Coronary artery disease     25% LAD stenosis on cath 2007.  Stable angina.  . Diverticulosis    . Essential hypertension    . Frequent PVCs   . Gastroesophageal reflux disease    . Gout    . Hyperlipidemia LDL goal < 100    . Long-term current use of opiate analgesic 09/07/2016  . Mild carpal tunnel syndrome of right wrist 01/24/2016   Mild degree electrically per EMG 01/24/2016   . Morbid obesity with BMI of 40.0-44.9, adult ([place])    . Normocytic anemia    . NSVT (nonsustained ventricular tachycardia) ([place])   . Obstructive sleep apnea     Moderate, AHI 29.8 per hour with moderately loud snoring and oxygen desaturation to a nadir of 79%. CPAP titration resulted in a prescription for 17 CWP.    [name] Open-angle glaucoma    . Osteoarthritis cervical spine    . Osteoarthritis of left knee 06/19/2013   Tricompartmental disease.  Treated with double hinged upright knee brace, steroid/xylocaine knee injections, and NSAIDs   . Osteoporosis 05/14/2017   s/p fracture of the right humerus from a fall at ground hight  . Persistent atrial fibrillation   . Right rotator cuff tear  Large full-thickness tear of the supraspinatus with mild retraction but no atrophy   . Secondary male hypogonadism 02/07/2017   Likely secondary to chronic opioid use  . Subclinical hypothyroidism    . 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 [facility])      Past Surgical History:  Procedure Laterality Date  . CARDIOVERSION N/A 12/30/2014   Procedure: CARDIOVERSION;  Surgeon: [name], MD;  Location: [facility] ENDOSCOPY;  Service: Cardiovascular;  Laterality: N/A;  . FRACTURE SURGERY Left 1980's   Elbow  . Left arm surgery    . Left leg surgery    . SHOULDER SURGERY     Right     Social History Juan Calderon  reports that he has never smoked. He has never used smokeless tobacco. He reports that he drinks about 14.0 standard drinks of alcohol per week. He reports that he does not use drugs.  family history includes Alzheimer's disease in his father; Early death in his brother; Heart failure in his brother and mother; Hypertension in his brother and sister; Osteoarthritis in his brother; Prostate cancer in his brother.  Allergies  Allergen Reactions  . Ramipril Swelling  . Testosterone Rash       PHYSICAL EXAMINATION: Vital signs: BP 134/74   Pulse 64   Ht 5' 9.5" (1.765 m) Comment: without shoes  Wt (!) 305 lb 12.8 oz (138.7 kg)   BMI 44.51 kg/m   Constitutional: generally well-appearing, no acute distress Psychiatric: alert and oriented x3, cooperative Eyes: extraocular movements intact, anicteric, conjunctiva pink Mouth: oral pharynx moist, no lesions Neck: supple no lymphadenopathy Cardiovascular: heart regular rate and rhythm, no murmur Lungs: clear to auscultation bilaterally Abdomen: soft, nontender, nondistended, no obvious ascites, no peritoneal signs, normal bowel sounds, no organomegaly Rectal: Deferred until colonoscopy Extremities: no clubbing, cyanosis, or lower extremity edema bilaterally Skin: no lesions on visible extremities Neuro: No focal deficits.  Cranial nerves intact  ASSESSMENT:  1.  History of colon polyp remotely.  No details 2.  Last colonoscopy almost 10 years ago revealing diverticulosis.  Now sent for surveillance by his PCP 3.  Multiple medical problems including morbid obesity, history of atrial fibrillation on chronic anticoagulation, sleep apnea, and diabetes mellitus.  Stable 4.  Chronic constipation 5.  GERD  PLAN:  1.  Surveillance colonoscopy.  The patient is HIGH RISK given his comorbidities and body habitus and the need to address his medications including Eliquis and metformin.The nature of the  procedure, as well as the risks, benefits, and alternatives were carefully and thoroughly reviewed with the patient. Ample time for discussion and questions allowed. The patient understood, was satisfied, and agreed to proceed. 2.  Hold Eliquis 2 days prior to the procedure if deemed reasonable by his cardiologist; we will correspond in this regard. 3.  Hold metformin the day of the examination to avoid unwanted hypoglycemia 4.  Reflux precautions with attention to weight loss 5.  Continue sorbitol for constipation.  May use MiraLAX as an alternative if 2 gaseous and agent as well as its potential effect on blood sugar

## 2018-06-24 NOTE — Telephone Encounter (Signed)
   Primary Cardiologist: Lauree Chandler, MD  Chart reviewed as part of pre-operative protocol coverage. Patient was contacted 06/24/2018 in reference to pre-operative risk assessment for pending surgery as outlined below.  Juan Calderon was last seen 03/25/18 by myself.  Since that day, Juan Calderon has done well without any new cardiac symptoms. I spoke with him on the phone this PM. Therefore, based on ACC/AHA guidelines, the patient would be at acceptable risk for the planned procedure without further cardiovascular testing.   I'll route to pharmacy for input on holding Eliquis (recently switched from Coumadin).  Patient actually sees Dr. [name] tomorrow so pre-op team should follow up his OV to make sure everything went well before finalizing clearance. If stable, OK to proceed.  Charlie Pitter, PA-C 06/24/2018, 4:56 PM

## 2018-06-25 ENCOUNTER — Ambulatory Visit (INDEPENDENT_AMBULATORY_CARE_PROVIDER_SITE_OTHER): Payer: Medicare Other | Admitting: Internal Medicine

## 2018-06-25 ENCOUNTER — Encounter: Payer: Self-pay | Admitting: Internal Medicine

## 2018-06-25 VITALS — BP 118/62 | HR 61 | Ht 69.5 in | Wt 302.2 lb

## 2018-06-25 DIAGNOSIS — I5030 Unspecified diastolic (congestive) heart failure: Secondary | ICD-10-CM | POA: Diagnosis not present

## 2018-06-25 DIAGNOSIS — Z79899 Other long term (current) drug therapy: Secondary | ICD-10-CM | POA: Diagnosis not present

## 2018-06-25 DIAGNOSIS — I5032 Chronic diastolic (congestive) heart failure: Secondary | ICD-10-CM

## 2018-06-25 DIAGNOSIS — I2510 Atherosclerotic heart disease of native coronary artery without angina pectoris: Secondary | ICD-10-CM

## 2018-06-25 DIAGNOSIS — I480 Paroxysmal atrial fibrillation: Secondary | ICD-10-CM

## 2018-06-25 NOTE — Telephone Encounter (Signed)
   Primary Cardiologist: Lauree Chandler, MD  Chart reviewed, pt seen by Dr [name] today in the office.  Given past medical history and based on ACC/AHA guidelines, Juan Calderon would be at acceptable risk for the planned procedure without further cardiovascular testing.   OK to hold Eliquis 48 hrs pre op and resume ASAP post op. .   I will route this recommendation to the requesting party via Epic fax function and remove from pre-op pool.  Please call with questions.  Kerin Ransom, PA-C 06/25/2018, 4:32 PM

## 2018-06-25 NOTE — Telephone Encounter (Signed)
Patient with diagnosis of Afib on Eliquis for anticoagulation.    Procedure: coloscopy Date of procedure: 08/12/2018  CHADS2-VASc score of  3 (CHF, HTN, DM)  CrCl >50 ml/min  Okay to hold Eliquis for 2 days as requested by GI provider. Please resume therapy ASAP after procedure.  Garlon Tuggle Rodriguez-Guzman PharmD, BCPS, [place] [id number] 06/25/2018 3:52 PM

## 2018-06-25 NOTE — Progress Notes (Signed)
Patient Care Team: Oval Linsey, MD as PCP - General (Internal Medicine) Burnell Blanks, MD as PCP - Cardiology (Cardiology) Clent Jacks, MD as Consulting Physician (Ophthalmology)   HPI  Juan Calderon is a 62 y.o. male Seen in follow-up for atrial fibrillation acute hypomagnesemia  he is morbidly obese He was started on flecainide. He has been holding sinus rhythm.  He has significant bradycardia associated with the use of digoxin beta-blockers.  Has been maintaining sinus rhythm on flecainide as best as we know.  Hospitalized 4/19 with osteomyelitis of his leg  He has a history of sleep apnea, but they continue to work on a CPAP system which he can tolerate;  He is working on oral appliance but cant afford it   chrnic dyspnea  Not exercising  Chronic mild edema. No palps   Eating a bunch and weight going up  No   DATE PR interval QRSduration Dose  4/16     96     10/19 196 102 100      DATE TEST EF   1/07 LHC  Nonobstructive  5/16 Echo   55-60 % LA 48            Thromboembolic risk factors (, HTN-1, DM-1) for a CHADSVASc Score of 2   Past Medical History:  Diagnosis Date  . Alcohol abuse    . Bradycardia   . C6 radiculopathy 01/24/2016   Right upper extremity, mild to moderate electrically by EMG on 01/24/2016  . Cataract    Left eye  . Chronic diastolic heart failure ([place])     with mild left ventricular hypertrophy on Echo 02/2010  . Chronic obstructive pulmonary disease ([place])    . Chronic osteomyelitis of femur ([place]) 04/06/2016  . Chronic osteomyelitis of left femur ([place]) 11/22/2017   Left femur s/p prior trauma  . Chronic pain syndrome     Left arm and leg s/p traumatic injury   . Chronic renal insufficiency    . Coronary artery disease     25% LAD stenosis on cath 2007.  Stable angina.  . Diverticulosis    . Essential hypertension    . Frequent PVCs   . Gastroesophageal reflux disease    . Gout    . Hyperlipidemia LDL goal < 100     . Long-term current use of opiate analgesic 09/07/2016  . Mild carpal tunnel syndrome of right wrist 01/24/2016   Mild degree electrically per EMG 01/24/2016   . Morbid obesity with BMI of 40.0-44.9, adult ([place])    . Normocytic anemia    . NSVT (nonsustained ventricular tachycardia) ([place])   . Obstructive sleep apnea     Moderate, AHI 29.8 per hour with moderately loud snoring and oxygen desaturation to a nadir of 79%. CPAP titration resulted in a prescription for 17 CWP.    [name] Open-angle glaucoma    . Osteoarthritis cervical spine    . Osteoarthritis of left knee 06/19/2013   Tricompartmental disease.  Treated with double hinged upright knee brace, steroid/xylocaine knee injections, and NSAIDs   . Osteoporosis 05/14/2017   s/p fracture of the right humerus from a fall at ground hight  . Persistent atrial fibrillation   . Right rotator cuff tear     Large full-thickness tear of the supraspinatus with mild retraction but no atrophy   . Secondary male hypogonadism 02/07/2017   Likely secondary to chronic opioid use  . Subclinical hypothyroidism    . 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 CARDIOVERSION N/A 12/30/2014   Procedure: CARDIOVERSION;  Surgeon: [name], MD;  Location: [facility] ENDOSCOPY;  Service: Cardiovascular;  Laterality: N/A;  . FRACTURE SURGERY Left 1980's   Elbow  . Left arm surgery    . Left leg surgery    . SHOULDER SURGERY     Right    Current Outpatient Medications  Medication Sig Dispense Refill  . albuterol (PROVENTIL HFA;VENTOLIN HFA) 108 (90 Base) MCG/ACT inhaler Inhale 2 puffs into the lungs every 6 (six) hours as needed for wheezing or shortness of breath. 1 Inhaler 0  . allopurinol (ZYLOPRIM) 300 MG tablet Take 1 tablet (300 mg total) by mouth daily. Please note the change in the number of tablets to take daily. 90 tablet 3  . apixaban (ELIQUIS) 5 MG TABS tablet  Take 1 tablet (5 mg total) by mouth 2 (two) times daily. 60 tablet 5  . atorvastatin (LIPITOR) 20 MG tablet Take 1 tablet (20 mg total) by mouth daily. 90 tablet 3  . Blood Glucose Monitoring Suppl (ONETOUCH VERIO) w/Device KIT 1 each by Does not apply route daily. 1 kit 0  . Dextromethorphan-Guaifenesin 20-400 MG/5ML SYRP Take 5 mLs by mouth every 4 (four) hours as needed (cough). 120 mL 0  . esomeprazole (NEXIUM) 40 MG capsule Take 1 capsule (40 mg total) by mouth daily at 12 noon. 90 capsule 3  . flecainide (TAMBOCOR) 100 MG tablet Take 1 tablet (100 mg total) by mouth every 12 (twelve) hours. 180 tablet 3  . glucose blood (ONETOUCH VERIO) test strip Use as instructed 100 each 12  . latanoprost (XALATAN) 0.005 % ophthalmic solution Place 1 drop into both eyes at bedtime. 7.5 mL 2  . magnesium oxide (MAG-OX) 400 MG tablet Take 2 tablets by mouth 2 (two) times daily.  3  . metFORMIN (GLUCOPHAGE) 1000 MG tablet Take 1 tablet (1,000 mg total) by mouth 2 (two) times daily with a meal. 180 tablet 3  . metoprolol tartrate (LOPRESSOR) 25 MG tablet TAKE 1 TABLET BY MOUTH TWICE A DAY 180 tablet 3  . nitroGLYCERIN (NITROSTAT) 0.4 MG SL tablet Place 0.4 mg under the tongue every 5 (five) minutes as needed for chest pain.    Glory Rosebush DELICA LANCETS 22G MISC Use 1 strip daily 100 each 5  . [START ON 08/12/2018] oxyCODONE-acetaminophen (PERCOCET) 10-325 MG tablet Take 1 tablet by mouth every 6 (six) hours as needed for pain. 100 tablet 0  . sildenafil (VIAGRA) 100 MG tablet Take 1 tablet (100 mg total) by mouth daily as needed for erectile dysfunction. 5 tablet 11  . Sorbitol 70 % SOLN Take 15-60 mLs by mouth daily as needed for constipation.    [name] terazosin (HYTRIN) 5 MG capsule Take 1 capsule (5 mg total) by mouth at bedtime. 90 capsule 3  . torsemide (DEMADEX) 20 MG tablet TAKE 2 TABLETS BY MOUTH 3 TIMES PER WEEK 72 tablet 3   No current facility-administered medications for this visit.     Allergies    Allergen Reactions  . Ramipril Swelling  . Testosterone Rash      Review of Systems negative except from HPI and PMH  Physical Exam BP 118/62   Pulse 61   Ht 5' 9.5" (1.765 m)   Wt (!) 302 lb 3.2 oz (137.1 kg)   SpO2 93%   BMI 43.99 kg/m  *  Well developed and Morbidly obese  in no acute distress HENT normal Neck supple with JVP-flat Clear Regular rate and rhythm, no murmurs or gallops Abd-soft with active BS No Clubbing cyanosis edema Skin-warm and dry A & Oriented  Grossly normal sensory and motor function   ECG  Sinus 61  20/10.46   Assessment and  Plan  Atrial fibrillation persistent  HFpEF  Morbid obesity  Obstructive sleep apnea  Preoperative assessme  Blood pressure well controlled  Euvolemic continue current meds  On Anticoagulation;  No bleeding issues just started on apixaban  No obvious interval atrial fibrillation.  Encourage weight loss and exercise.

## 2018-06-25 NOTE — Telephone Encounter (Signed)
Left voicemail for patient to call back. 

## 2018-06-25 NOTE — Patient Instructions (Addendum)
Medication Instructions:  Your physician recommends that you continue on your current medications as directed. Please refer to the Current Medication list given to you today.  Labwork: None ordered.  Testing/Procedures: None ordered.  Follow-Up: Your physician recommends that you schedule a follow-up appointment in:   [place], [place] in one year  Any Other Special Instructions Will Be Listed Below (If Applicable).     If you need a refill on your cardiac medications before your next appointment, please call your pharmacy.

## 2018-06-27 ENCOUNTER — Encounter: Payer: Medicaid Other | Admitting: Internal Medicine

## 2018-07-18 ENCOUNTER — Encounter: Payer: Self-pay | Admitting: Internal Medicine

## 2018-07-18 ENCOUNTER — Ambulatory Visit (INDEPENDENT_AMBULATORY_CARE_PROVIDER_SITE_OTHER): Payer: Medicare Other | Admitting: Internal Medicine

## 2018-07-18 VITALS — BP 119/69 | HR 58 | Temp 97.8°F | Wt 306.6 lb

## 2018-07-18 DIAGNOSIS — Z7901 Long term (current) use of anticoagulants: Secondary | ICD-10-CM

## 2018-07-18 DIAGNOSIS — G4733 Obstructive sleep apnea (adult) (pediatric): Secondary | ICD-10-CM | POA: Diagnosis not present

## 2018-07-18 DIAGNOSIS — I110 Hypertensive heart disease with heart failure: Secondary | ICD-10-CM

## 2018-07-18 DIAGNOSIS — M1A00X0 Idiopathic chronic gout, unspecified site, without tophus (tophi): Secondary | ICD-10-CM | POA: Diagnosis not present

## 2018-07-18 DIAGNOSIS — Z79899 Other long term (current) drug therapy: Secondary | ICD-10-CM

## 2018-07-18 DIAGNOSIS — I10 Essential (primary) hypertension: Secondary | ICD-10-CM

## 2018-07-18 DIAGNOSIS — J069 Acute upper respiratory infection, unspecified: Secondary | ICD-10-CM

## 2018-07-18 DIAGNOSIS — K5903 Drug induced constipation: Secondary | ICD-10-CM

## 2018-07-18 DIAGNOSIS — E669 Obesity, unspecified: Secondary | ICD-10-CM | POA: Diagnosis not present

## 2018-07-18 DIAGNOSIS — Z79891 Long term (current) use of opiate analgesic: Secondary | ICD-10-CM

## 2018-07-18 DIAGNOSIS — X58XXXA Exposure to other specified factors, initial encounter: Secondary | ICD-10-CM

## 2018-07-18 DIAGNOSIS — Z6841 Body Mass Index (BMI) 40.0 and over, adult: Secondary | ICD-10-CM

## 2018-07-18 DIAGNOSIS — N521 Erectile dysfunction due to diseases classified elsewhere: Secondary | ICD-10-CM | POA: Diagnosis not present

## 2018-07-18 DIAGNOSIS — E785 Hyperlipidemia, unspecified: Secondary | ICD-10-CM | POA: Diagnosis not present

## 2018-07-18 DIAGNOSIS — S161XXA Strain of muscle, fascia and tendon at neck level, initial encounter: Secondary | ICD-10-CM | POA: Diagnosis not present

## 2018-07-18 DIAGNOSIS — J4489 Other specified chronic obstructive pulmonary disease: Secondary | ICD-10-CM

## 2018-07-18 DIAGNOSIS — M86652 Other chronic osteomyelitis, left thigh: Secondary | ICD-10-CM | POA: Diagnosis not present

## 2018-07-18 DIAGNOSIS — I25118 Atherosclerotic heart disease of native coronary artery with other forms of angina pectoris: Secondary | ICD-10-CM

## 2018-07-18 DIAGNOSIS — I5032 Chronic diastolic (congestive) heart failure: Secondary | ICD-10-CM

## 2018-07-18 DIAGNOSIS — T402X5A Adverse effect of other opioids, initial encounter: Secondary | ICD-10-CM

## 2018-07-18 DIAGNOSIS — G894 Chronic pain syndrome: Secondary | ICD-10-CM | POA: Diagnosis not present

## 2018-07-18 DIAGNOSIS — Z7984 Long term (current) use of oral hypoglycemic drugs: Secondary | ICD-10-CM | POA: Diagnosis not present

## 2018-07-18 DIAGNOSIS — T402X5S Adverse effect of other opioids, sequela: Secondary | ICD-10-CM | POA: Diagnosis not present

## 2018-07-18 DIAGNOSIS — K219 Gastro-esophageal reflux disease without esophagitis: Secondary | ICD-10-CM

## 2018-07-18 DIAGNOSIS — I25119 Atherosclerotic heart disease of native coronary artery with unspecified angina pectoris: Secondary | ICD-10-CM

## 2018-07-18 DIAGNOSIS — E1140 Type 2 diabetes mellitus with diabetic neuropathy, unspecified: Secondary | ICD-10-CM | POA: Diagnosis not present

## 2018-07-18 DIAGNOSIS — E7800 Pure hypercholesterolemia, unspecified: Secondary | ICD-10-CM

## 2018-07-18 DIAGNOSIS — J449 Chronic obstructive pulmonary disease, unspecified: Secondary | ICD-10-CM

## 2018-07-18 MED ORDER — ALBUTEROL SULFATE HFA 108 (90 BASE) MCG/ACT IN AERS: 1.0000 | INHALATION_SPRAY | Freq: Four times a day (QID) | RESPIRATORY_TRACT | 11 refills | Status: DC | PRN

## 2018-07-18 MED ORDER — SORBITOL 70 % RE SOLN: 15.0000 mL | Freq: Every day | RECTAL | 11 refills | Status: DC | PRN

## 2018-07-18 MED ORDER — SILDENAFIL CITRATE 100 MG PO TABS: 100.0000 mg | ORAL_TABLET | Freq: Every day | ORAL | 11 refills | Status: DC | PRN

## 2018-07-18 MED ORDER — NITROGLYCERIN 0.4 MG SL SUBL: 0.4000 mg | SUBLINGUAL_TABLET | SUBLINGUAL | 1 refills | Status: DC | PRN

## 2018-07-18 NOTE — Progress Notes (Signed)
   Subjective:    Patient ID: Juan Calderon, male    DOB: [birth year], 62 y.o.   MRN: [id number]  HPI  Juan Calderon is here for follow-up of his chronic diastolic heart failure, essential hypertension, chronic pain syndrome, gastroesophageal reflux disease, hyperlipidemia, type 2 diabetes with neuropathy causing erectile dysfunction, coronary artery disease with stable angina, idiopathic gout, constipation due to opioid therapy, hypomagnesemia, and chronic osteomyelitis of left femur. Please see the A&P for the status of the pt's chronic medical problems.  Other than an upper respiratory tract infection with cough and associated neck strain and headache he is without acute complaints.  Review of Systems  Constitutional: Negative for activity change and unexpected weight change.  HENT: Positive for congestion, rhinorrhea and sinus pain.   Respiratory: Positive for cough and shortness of breath. Negative for chest tightness and wheezing.   Cardiovascular: Positive for leg swelling. Negative for chest pain and palpitations.  Gastrointestinal: Positive for constipation. Negative for abdominal pain, diarrhea, nausea and vomiting.  Musculoskeletal: Positive for arthralgias and gait problem. Negative for joint swelling and myalgias.  Skin: Positive for wound.  Neurological: Positive for headaches.  Psychiatric/Behavioral: Negative for dysphoric mood.      Objective:   Physical Exam  Constitutional: He is oriented to person, place, and time. He appears well-developed and well-nourished.  Non-toxic appearance.  HENT:  Head: Normocephalic and atraumatic.  Eyes: No scleral icterus.  Musculoskeletal: He exhibits edema.  Neurological: He is alert and oriented to person, place, and time. He has normal strength.  Skin: Skin is warm and dry. He is not diaphoretic.  Psychiatric: He has a normal mood and affect. His behavior is normal. His mood appears not anxious. He is not agitated.  Nursing note  and vitals reviewed.     Assessment & Plan:   Please see problem oriented charting.

## 2018-07-18 NOTE — Assessment & Plan Note (Signed)
Assessment  He is tolerating the atorvastatin 20 mg by mouth daily without myalgias.  Plan  We will continue this medium intensity statin and reassess for intolerances at the follow-up visit.

## 2018-07-18 NOTE — Assessment & Plan Note (Signed)
Assessment  His blood pressure is well controlled at 119/69.  This is on metoprolol 25 mg by mouth twice daily and terazosin 5 mg by mouth at bedtime.  Plan  We will continue the metoprolol 25 mg by mouth twice daily and terazosin 5 mg by mouth at bedtime and reassess his blood pressure control at the follow-up visit.

## 2018-07-18 NOTE — Assessment & Plan Note (Signed)
Assessment  He has run out of the sorbitol, but when using it his constipation due to opioid therapy was manageable.  Plan  I have renewed the sorbitol and will reassess the efficacy of this therapy in managing his opioid related constipation at the follow-up visit.

## 2018-07-18 NOTE — Assessment & Plan Note (Signed)
Assessment  After drainage of his Brodie's abscess and antibiotic therapy his pain has resolved.  At the last visit at [facility] he was felt to no longer require antibiotics.  Plan  This issue has been addressed surgically at [facility] orthopedics.  No further therapy is necessary at this time.

## 2018-07-18 NOTE — Assessment & Plan Note (Signed)
Assessment  He has not had any acute gout flare on the allopurinol 300 mg by mouth daily.  He is tolerating this medication well.  Plan  We will continue the allopurinol at 300 mg by mouth daily and reassess for evidence of acute gouty flares in the interim at the follow-up visit.

## 2018-07-18 NOTE — Assessment & Plan Note (Addendum)
He is due for a urine for microalbumin but states he is unable to produce a sample for me today.  We will attempt to get a urine sample for microalbumin at the follow-up visit.  He is scheduled for his colonoscopy in December.  He is otherwise up-to-date on his healthcare maintenance.

## 2018-07-18 NOTE — Assessment & Plan Note (Signed)
Assessment  His chronic pain syndrome status post traumatic injury is reasonably well controlled symptomatically on the Percocet 10-325 mg 1 tablet every 6 hours as needed.  He receives 100 tablets/month.  The draining of his Brodie abscess has markedly decreased the left thigh pain.  Plan  We will continue the Percocet 10-325 mg 1 tablet every 6 hours as needed dispense number 100/month.  This allows him to ambulate and improve the quality of life by keeping his chronic pain syndrome in check.  We will reassess the efficacy of this regimen at the follow-up visit.

## 2018-07-18 NOTE — Assessment & Plan Note (Signed)
Assessment  He is taking his magnesium oxide 800 mg by mouth twice daily.  He notes this is not covered by his insurance company and has to pay for it out of pocket.  Plan  Once again we will try to figure out why his insurance company will not pay for the magnesium oxide given the importance of this medication in managing his hypomagnesemia and avoiding the resultant side effects including his atrial arrhythmia.  We will continue the magnesium oxide and her milligrams by mouth twice daily.

## 2018-07-18 NOTE — Assessment & Plan Note (Signed)
Assessment  He has not had any angina on his current antianginal regimen and therefore has not required any sublingual nitroglycerin.  His current antianginal regimen includes metoprolol 25 mg by mouth twice daily, atorvastatin 20 mg by mouth daily, and sublingual nitroglycerin 0.4 mg every 5 minutes as needed for chest pain.  Plan  We will continue the metoprolol at 25 mg by mouth twice daily, atorvastatin 20 mg by mouth daily, and I have renewed his sublingual nitroglycerin 0.4 mg every 5 minutes as needed to make sure they are not expired should he ever require one.  He knows not to take the sublingual nitroglycerin within 24 hours of trying the sildenafil.

## 2018-07-18 NOTE — Assessment & Plan Note (Signed)
Assessment  His chronic diastolic heart failure manifests is mild dyspnea on exertion.  He states that this is been stable on his current regimen.  This regimen includes torsemide 20 mg by mouth 3 times weekly, metoprolol 25 mg by mouth twice daily, and terazosin 5 mg by mouth nightly.  We are also trying to address his obesity and obstructive sleep apnea.  His diabetes is well controlled.  Plan  We will continue to address those factors that impact upon his diastolic heart failure including his obesity, diabetes, and obstructive sleep apnea.  In addition, we will continue torsemide 20 mg by mouth 3 times weekly, metoprolol 25 mg by mouth twice daily, and terazosin 5 mg by mouth each night.  We will reassess the efficacy of this regimen at managing his symptoms of chronic diastolic heart failure at the follow-up visit.

## 2018-07-18 NOTE — Patient Instructions (Addendum)
It was good to see you today.  I am sorry you have the cold recently.  It will have to run its course, but you should notice an improvement.  1) Keep taking the medications as you are.  2) Give the viagra a try.  Your risk is rather low and it may improve the situation.  Your testosterone may be low, with the percocet, but the risks of testosterone replacement in you are real with the sleep apnea especially.  3) We will again call the insurance company and see if we can get this covered as you need it.  4) I renewed the medications you needed refilled.  I will see you back in 6 months, sooner if necessary.

## 2018-07-18 NOTE — Assessment & Plan Note (Signed)
Assessment  He has been taking the esomeprazole 40 mg by mouth daily as needed rather than consistently.  This has resulted in some breakthrough gastroesophageal reflux symptoms.  Plan  He is already decided that he will take the esomeprazole 40 mg by mouth daily rather than as needed.  We will reassess the efficacy of taking the PPI therapy consistently in controlling his symptoms at the follow-up visit.

## 2018-07-18 NOTE — Assessment & Plan Note (Signed)
Assessment  Approximately 1 month ago his hemoglobin A1c was 6.5.  This is on metformin 1000 mg by mouth twice daily.  His erectile dysfunction continues and he has yet to try the sildenafil 100 mg by mouth as needed.  Plan  We will continue the Metformin at 1000 mg by mouth twice daily and he was encouraged to try the sildenafil 100 mg by mouth as needed to see if it is efficacious.  We will reassess his diabetic control with a repeat hemoglobin A1c at the follow-up visit.

## 2018-07-29 ENCOUNTER — Ambulatory Visit (INDEPENDENT_AMBULATORY_CARE_PROVIDER_SITE_OTHER): Payer: Medicare Other | Admitting: *Deleted

## 2018-07-29 DIAGNOSIS — Z5181 Encounter for therapeutic drug level monitoring: Secondary | ICD-10-CM

## 2018-07-29 DIAGNOSIS — Z7901 Long term (current) use of anticoagulants: Secondary | ICD-10-CM

## 2018-07-29 DIAGNOSIS — I480 Paroxysmal atrial fibrillation: Secondary | ICD-10-CM

## 2018-07-29 NOTE — Progress Notes (Signed)
Pt was started on Eliquis for Afib on 06/18/18 with PM dose.    Reviewed patients medication list.  Pt is not currently on any combined P-gp and strong CYP3A4 inhibitors/inducers (ketoconazole, traconazole, ritonavir, carbamazepine, phenytoin, rifampin, St. John's wort).  Reviewed labs.  SCr 1.36, Weight 302.2 lbs, Age 62 yr old.  Dose appropriate based on age, weight, and SCr.  Hgb and HCT 12.2/37.4.   A full discussion of the nature of anticoagulants has been carried out.  A benefit/risk analysis has been presented to the patient, so that they understand the justification for choosing anticoagulation with Eliquis at this time.  The need for compliance is stressed.  Pt is aware to take the medication twice daily.  Side effects of potential bleeding are discussed, including unusual colored urine or stools, coughing up blood or coffee ground emesis, nose bleeds or serious fall or head trauma.  Discussed signs and symptoms of stroke. The patient should avoid any OTC items containing aspirin or ibuprofen.  Avoid alcohol consumption. Call if any signs of abnormal bleeding.  Discussed financial obligations and resolved any difficulty in obtaining medication.  07/30/18 @ 11:30am-Telephoned pt and instructed him to continue taking Eliquis 5mg  BID. Discussed Calling us for any bleeding concerns or refills.  Next lab test in 6 months. Understanding verbalized.

## 2018-07-30 LAB — BASIC METABOLIC PANEL WITH GFR
BUN/Creatinine Ratio: 14 (ref 10–24)
BUN: 19 mg/dL (ref 8–27)
CO2: 23 mmol/L (ref 20–29)
Calcium: 9.5 mg/dL (ref 8.6–10.2)
Chloride: 100 mmol/L (ref 96–106)
Creatinine, Ser: 1.36 mg/dL — ABNORMAL HIGH (ref 0.76–1.27)
GFR calc Af Amer: 63 mL/min/1.73
GFR calc non Af Amer: 55 mL/min/1.73 — ABNORMAL LOW
Glucose: 138 mg/dL — ABNORMAL HIGH (ref 65–99)
Potassium: 3.8 mmol/L (ref 3.5–5.2)
Sodium: 142 mmol/L (ref 134–144)

## 2018-07-30 LAB — CBC
Hematocrit: 37.4 % — ABNORMAL LOW (ref 37.5–51.0)
Hemoglobin: 12.2 g/dL — ABNORMAL LOW (ref 13.0–17.7)
MCH: 29.7 pg (ref 26.6–33.0)
MCHC: 32.6 g/dL (ref 31.5–35.7)
MCV: 91 fL (ref 79–97)
Platelets: 238 x10E3/uL (ref 150–450)
RBC: 4.11 x10E6/uL — ABNORMAL LOW (ref 4.14–5.80)
RDW: 14.1 % (ref 12.3–15.4)
WBC: 8 x10E3/uL (ref 3.4–10.8)

## 2018-08-07 ENCOUNTER — Telehealth: Payer: Self-pay

## 2018-08-07 ENCOUNTER — Telehealth: Payer: Self-pay | Admitting: *Deleted

## 2018-08-07 NOTE — Telephone Encounter (Signed)
Spoke with patient and told him that per Dr. [name], he could hold his Eliquis for 2 days prior to his procedure.  Patient understood

## 2018-08-07 NOTE — Telephone Encounter (Signed)
This note was completed by Philemon Kingdom, CMA

## 2018-08-07 NOTE — Telephone Encounter (Signed)
Left message to call me regarding his eliquis.  I want to determine if he had been called regarding dosage and whether or not to stop it.  I did speak to the patient and he is unaware of whether to stop the eliquis.  I have called Dr. Blanch Media CMA, and we will discuss and call the patient.

## 2018-08-11 ENCOUNTER — Telehealth: Payer: Self-pay | Admitting: Internal Medicine

## 2018-08-11 NOTE — Telephone Encounter (Signed)
Called patient back after questions he had regarding eating corn this over the weekend and taking his Eliquis. Pt did not take his Eiliquis Sunday or Dalene Robards so he was told it was ok to proceed with his Colonoscopy tomorrow. Also reviewed other medications including metformin. Sm

## 2018-08-12 ENCOUNTER — Encounter: Payer: Self-pay | Admitting: Internal Medicine

## 2018-08-12 ENCOUNTER — Ambulatory Visit (AMBULATORY_SURGERY_CENTER): Payer: Medicare Other | Admitting: Internal Medicine

## 2018-08-12 VITALS — BP 118/66 | HR 56 | Temp 97.8°F | Resp 9 | Ht 69.5 in | Wt 302.0 lb

## 2018-08-12 DIAGNOSIS — D122 Benign neoplasm of ascending colon: Secondary | ICD-10-CM

## 2018-08-12 DIAGNOSIS — Z86010 Personal history of colonic polyps: Secondary | ICD-10-CM | POA: Diagnosis not present

## 2018-08-12 DIAGNOSIS — K648 Other hemorrhoids: Secondary | ICD-10-CM

## 2018-08-12 DIAGNOSIS — D124 Benign neoplasm of descending colon: Secondary | ICD-10-CM | POA: Diagnosis not present

## 2018-08-12 DIAGNOSIS — D123 Benign neoplasm of transverse colon: Secondary | ICD-10-CM

## 2018-08-12 HISTORY — DX: Other hemorrhoids: K64.8

## 2018-08-12 MED ORDER — SODIUM CHLORIDE 0.9 % IV SOLN: 500.0000 mL | Freq: Once | INTRAVENOUS | Status: DC

## 2018-08-12 NOTE — Progress Notes (Signed)
Called to room to assist during endoscopic procedure.  Patient ID and intended procedure confirmed with present staff. Received instructions for my participation in the procedure from the performing physician.  

## 2018-08-12 NOTE — Progress Notes (Signed)
PT taken to PACU. Monitors in place. VSS. Report given to RN. 

## 2018-08-12 NOTE — Patient Instructions (Signed)
YOU HAD AN ENDOSCOPIC PROCEDURE TODAY AT [facility]:   Refer to the procedure report that was given to you for any specific questions about what was found during the examination.  If the procedure report does not answer your questions, please call your gastroenterologist to clarify.  If you requested that your care partner not be given the details of your procedure findings, then the procedure report has been included in a sealed envelope for you to review at your convenience later.  YOU SHOULD EXPECT: Some feelings of bloating in the abdomen. Passage of more gas than usual.  Walking can help get rid of the air that was put into your GI tract during the procedure and reduce the bloating. If you had a lower endoscopy (such as a colonoscopy or flexible sigmoidoscopy) you may notice spotting of blood in your stool or on the toilet paper. If you underwent a bowel prep for your procedure, you may not have a normal bowel movement for a few days.  Please Note:  You might notice some irritation and congestion in your nose or some drainage.  This is from the oxygen used during your procedure.  There is no need for concern and it should clear up in a day or so.  SYMPTOMS TO REPORT IMMEDIATELY:   Following lower endoscopy (colonoscopy or flexible sigmoidoscopy):  Excessive amounts of blood in the stool  Significant tenderness or worsening of abdominal pains  Swelling of the abdomen that is new, acute  Fever of 100F or higher   For urgent or emergent issues, a gastroenterologist can be reached at any hour by calling [phone].   DIET:  We do recommend a small meal at first, but then you may proceed to your regular diet.  Drink plenty of fluids but you should avoid alcoholic beverages for 24 hours.  ACTIVITY:  You should plan to take it easy for the rest of today and you should NOT DRIVE or use heavy machinery until tomorrow (because of the sedation medicines used during the test).     FOLLOW UP: Our staff will call the number listed on your records the next business day following your procedure to check on you and address any questions or concerns that you may have regarding the information given to you following your procedure. If we do not reach you, we will leave a message.  However, if you are feeling well and you are not experiencing any problems, there is no need to return our call.  We will assume that you have returned to your regular daily activities without incident.  If any biopsies were taken you will be contacted by phone or by letter within the next 1-3 weeks.  Please call us at [phone] if you have not heard about the biopsies in 3 weeks.    SIGNATURES/CONFIDENTIALITY: You and/or your care partner have signed paperwork which will be entered into your electronic medical record.  These signatures attest to the fact that that the information above on your After Visit Summary has been reviewed and is understood.  Full responsibility of the confidentiality of this discharge information lies with you and/or your care-partner.  Follow up in 3 years-2022  Resume Eliquis today at prior dose.  Diverticulosis and polyp information given.  Hemorrhoid information given,

## 2018-08-12 NOTE — Op Note (Signed)
[facility] Patient Name: Juan Calderon Procedure Date: 08/12/2018 10:56 AM MRN: [id number] Endoscopist: Docia Chuck. Henrene Pastor , MD Age: 62 Referring MD:  Date of Birth: Jul 28, 1956 Gender: Male Account #: [id number] Procedure:                Colonoscopy with cold snare polypectomy x 6 Indications:              High risk colon cancer surveillance: Personal                            history of colonic polyps. Reports previous                            colonoscopies elsewhere. Index exam (no details)                            with "large polyp". Last examination September 2010                            with left-sided diverticulosis ([place]) Medicines:                Monitored Anesthesia Care Procedure:                Pre-Anesthesia Assessment:                           - Prior to the procedure, a History and Physical                            was performed, and patient medications and                            allergies were reviewed. The patient's tolerance of                            previous anesthesia was also reviewed. The risks                            and benefits of the procedure and the sedation                            options and risks were discussed with the patient.                            All questions were answered, and informed consent                            was obtained. Prior Anticoagulants: The patient has                            taken Eliquis (apixaban), last dose was 3 days  prior to procedure. ASA Grade Assessment: III - A                            patient with severe systemic disease. After                            reviewing the risks and benefits, the patient was                            deemed in satisfactory condition to undergo the                            procedure.                           After obtaining informed consent, the colonoscope             was passed under direct vision. Throughout the                            procedure, the patient's blood pressure, pulse, and                            oxygen saturations were monitored continuously. The                            Colonoscope was introduced through the anus and                            advanced to the the cecum, identified by                            appendiceal orifice and ileocecal valve. The                            ileocecal valve, appendiceal orifice, and rectum                            were photographed. The quality of the bowel                            preparation was excellent. The colonoscopy was                            performed without difficulty. The patient tolerated                            the procedure well. The bowel preparation used was                            SUPREP. Scope In: 10:58:07 AM Scope Out: 11:17:14 AM Scope Withdrawal Time: 0 hours 15 minutes 37 seconds  Total Procedure Duration: 0 hours 19 minutes 7 seconds  Findings:                 Six  polyps were found in the descending colon,                            transverse colon and ascending colon. The polyps                            were 3 to 6 mm in size. These polyps were removed                            with a cold snare. Resection and retrieval were                            complete.                           Multiple diverticula were found in the left colon                            and right colon.                           Internal hemorrhoids were found during retroflexion.                           The exam was otherwise without abnormality on                            direct and retroflexion views. Complications:            No immediate complications. Estimated blood loss:                            None. Estimated Blood Loss:     Estimated blood loss: none. Impression:               - Six 3 to 6 mm polyps in the descending colon, in                             the transverse colon and in the ascending colon,                            removed with a cold snare. Resected and retrieved.                           - Diverticulosis in the left colon and in the right                            colon.                           - Internal hemorrhoids.                           - The examination was otherwise normal on direct  and retroflexion views. Recommendation:           - Repeat colonoscopy in 3 years for surveillance.                           - Resume Eliquis (apixaban) today at prior dose.                           - Patient has a contact number available for                            emergencies. The signs and symptoms of potential                            delayed complications were discussed with the                            patient. Return to normal activities tomorrow.                            Written discharge instructions were provided to the                            patient.                           - Resume previous diet.                           - Continue present medications.                           - Await pathology results. Docia Chuck. Henrene Pastor, MD 08/12/2018 11:25:07 AM This report has been signed electronically.

## 2018-08-13 ENCOUNTER — Telehealth: Payer: Self-pay | Admitting: *Deleted

## 2018-08-13 NOTE — Telephone Encounter (Signed)
  Follow up Call-  Call back number 08/12/2018  Post procedure Call Back phone  # [phone]  Permission to leave phone message Yes  Some recent data might be hidden     Patient questions:  Do you have a fever, pain , or abdominal swelling? No. Pain Score  0 *  Have you tolerated food without any problems? Yes.    Have you been able to return to your normal activities? Yes.    Do you have any questions about your discharge instructions: Diet   No. Medications  No. Follow up visit  No.  Do you have questions or concerns about your Care? No.  Actions: * If pain score is 4 or above: No action needed, pain <4.

## 2018-08-14 ENCOUNTER — Encounter: Payer: Self-pay | Admitting: Internal Medicine

## 2018-08-15 ENCOUNTER — Other Ambulatory Visit: Payer: Self-pay | Admitting: Cardiovascular Disease

## 2018-08-15 DIAGNOSIS — I4891 Unspecified atrial fibrillation: Secondary | ICD-10-CM

## 2018-08-21 ENCOUNTER — Encounter: Payer: Self-pay | Admitting: Internal Medicine

## 2018-09-09 ENCOUNTER — Telehealth: Payer: Self-pay

## 2018-09-09 NOTE — Telephone Encounter (Signed)
Pt states the insurance will not covered for magnesium oxide (MAG-OX) 400 MG tablet, please call pt back.

## 2018-09-11 IMAGING — CR DG SHOULDER 2+V*R*
2 series · 2 of 2 positions shown · non-contrast
Comparison: MRI right shoulder 05/31/2012

CLINICAL DATA: Slip and fall injury tonight. Right shoulder and
humeral pain with limited range of motion. Left anterior rib pain.

EXAM:
RIGHT SHOULDER - 2+ VIEW

[shoulder grashey]
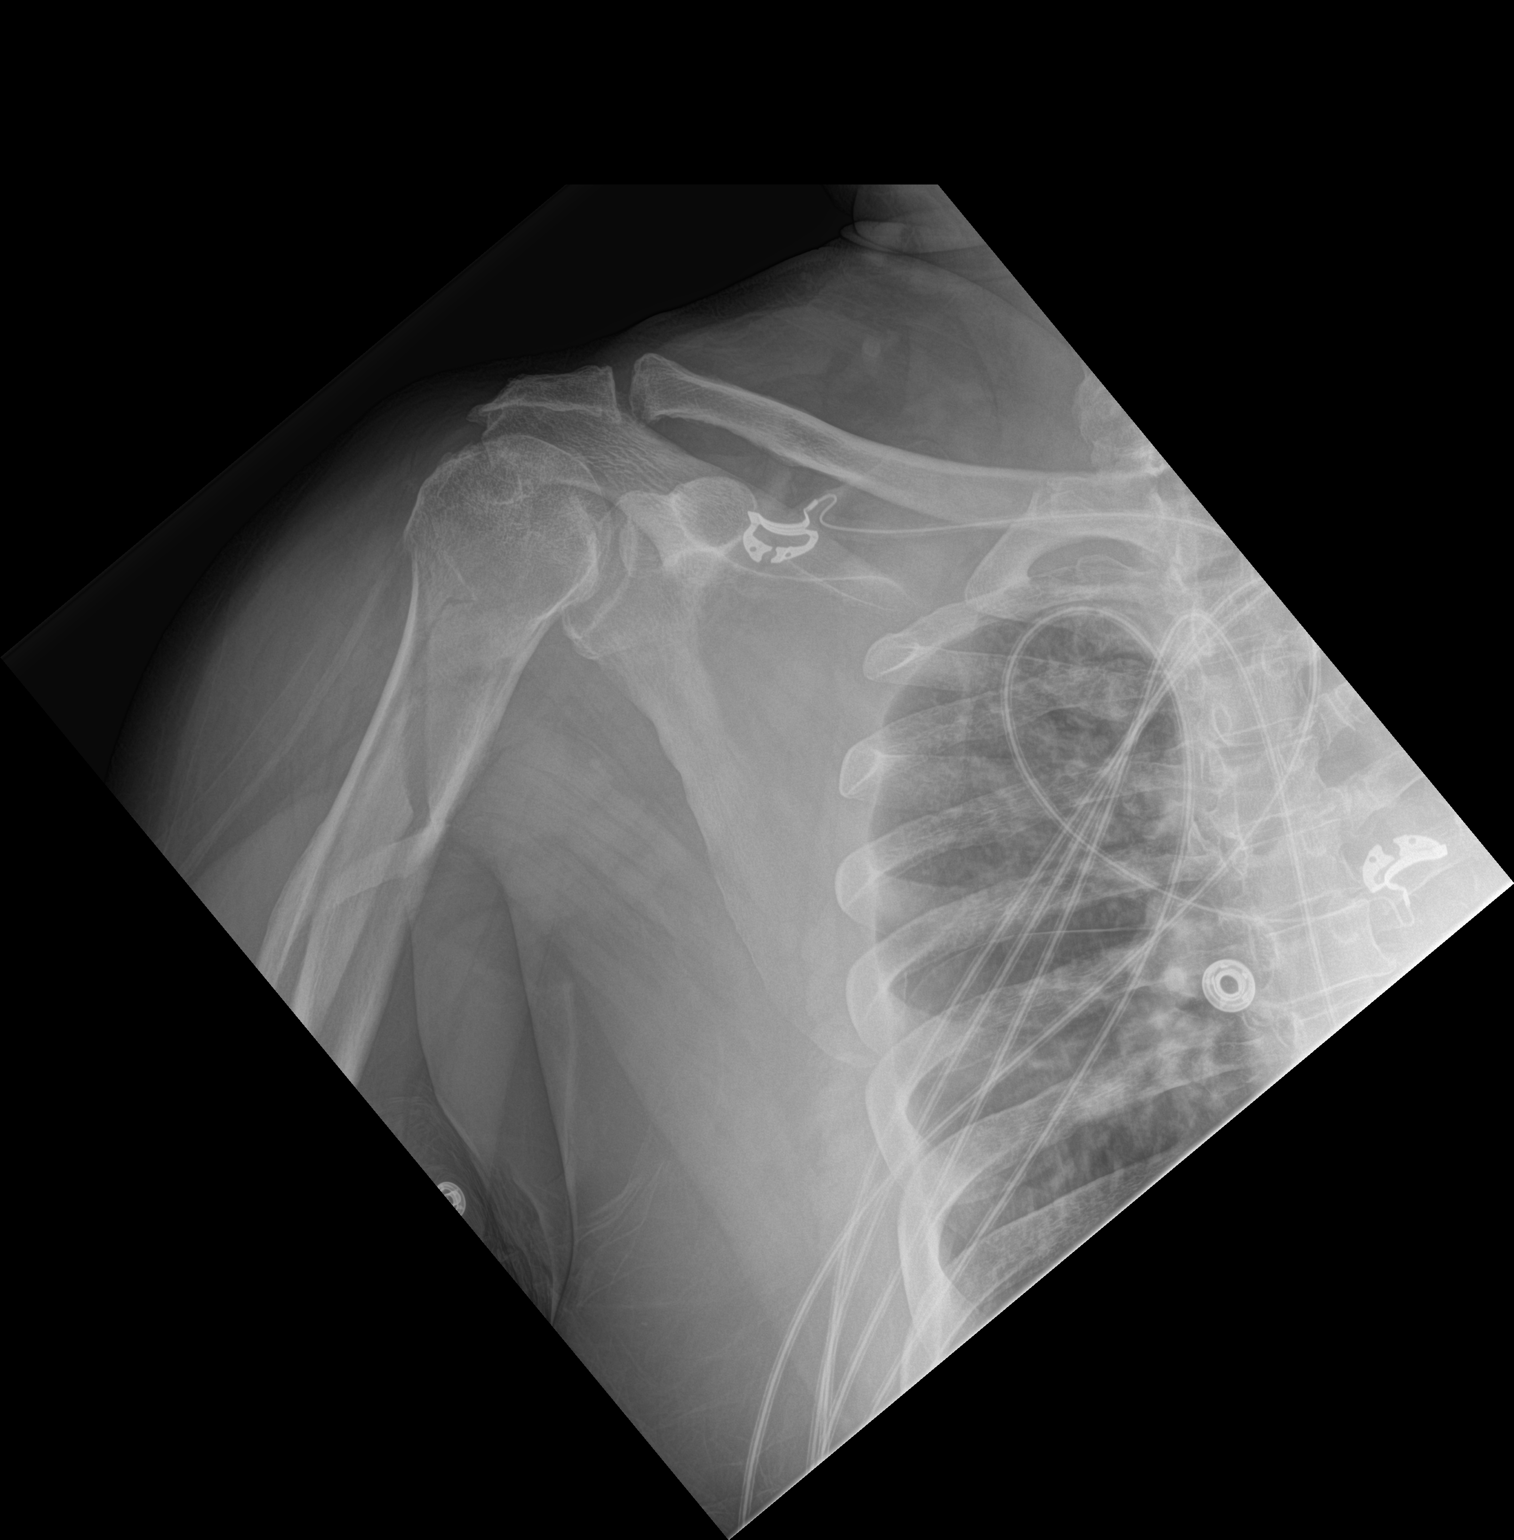

[shoulder y view]
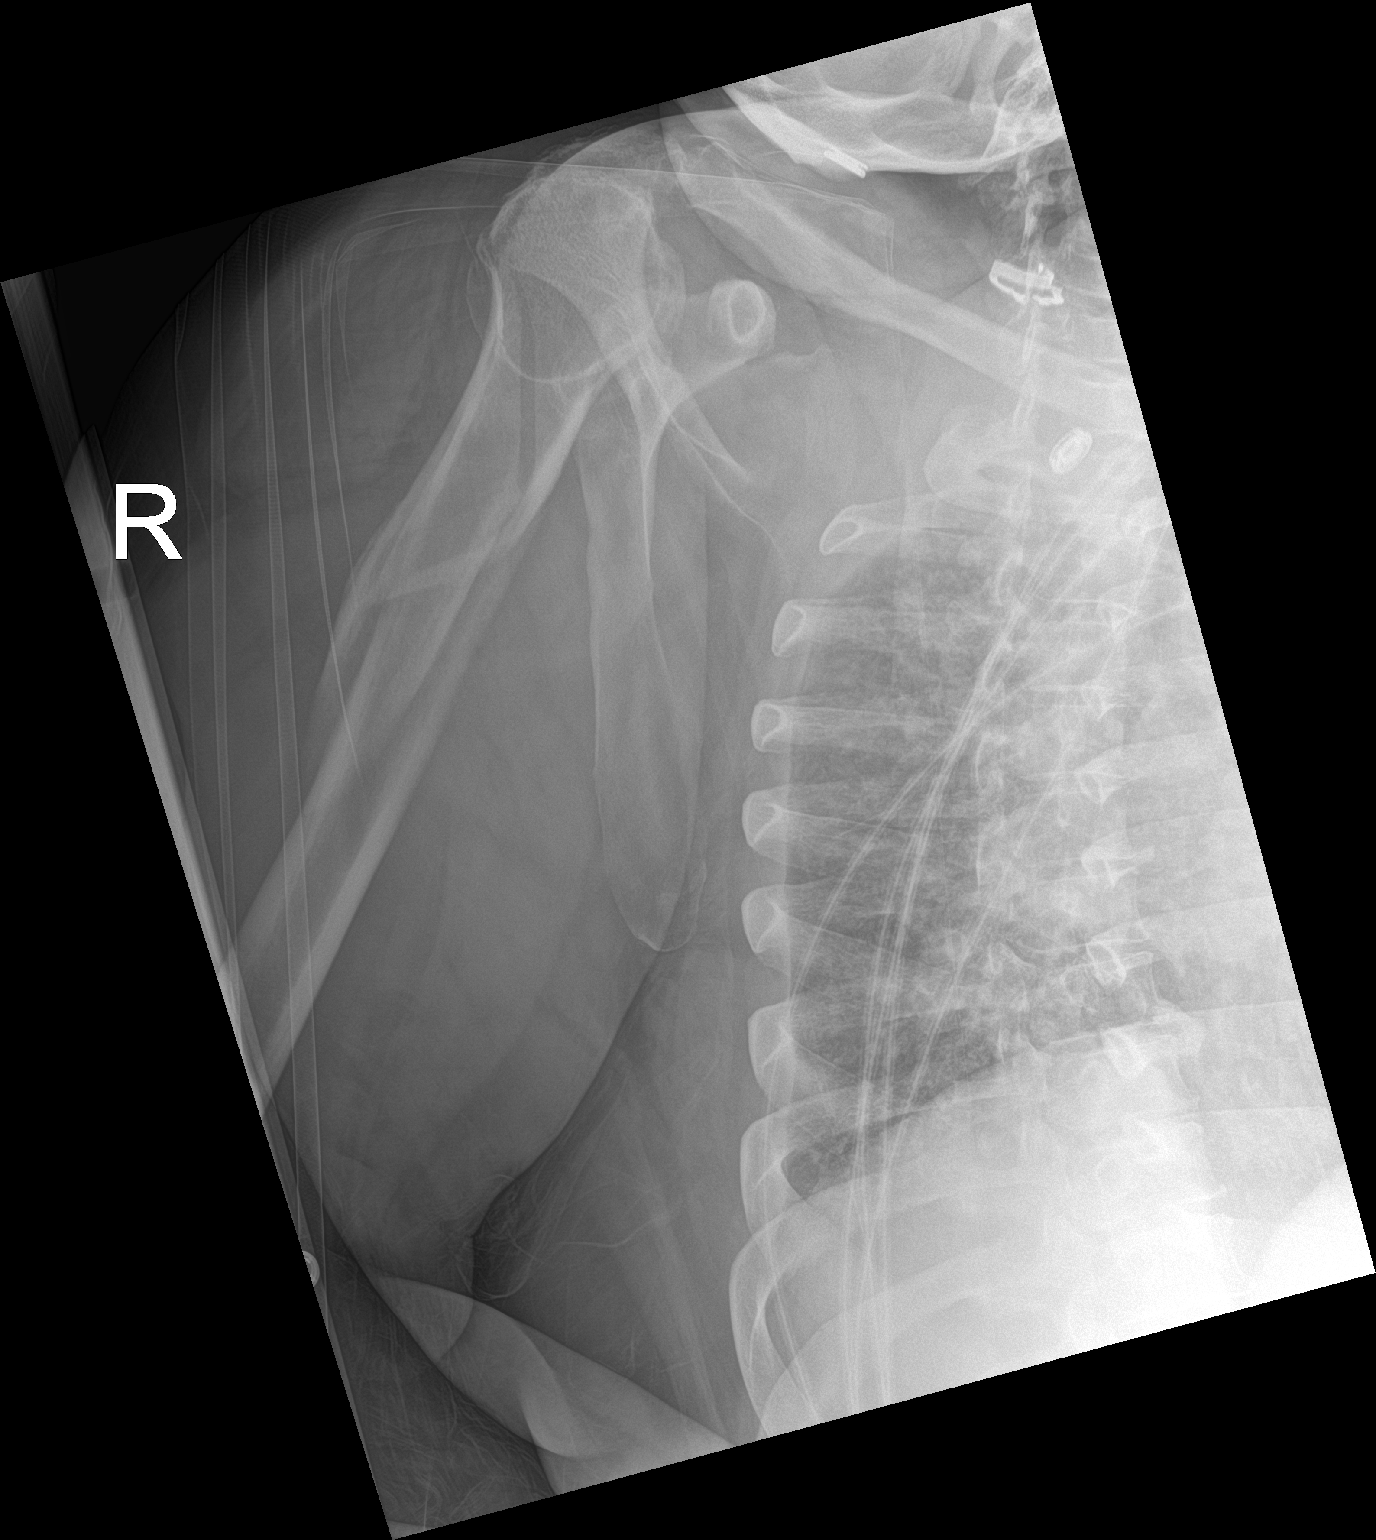

[2 of 2 positions shown; findings below may reference images not displayed]

FINDINGS: Acute comminuted spiral fracture of the proximal right humerus
extending from the lateral aspect of the humeral head down to the
level of the proximal/ mid shaft region. Mild displacement and
overriding of fracture fragments is noted. No evidence of
glenohumeral dislocation. Degenerative changes in the glenohumeral
joint. Soft tissues are unremarkable.
IMPRESSION: Acute comminuted spiral fractures of the proximal right humerus
extending from the lateral aspect of the humeral head down to the
level of the proximal/mid shaft region.

## 2018-09-11 IMAGING — CR DG ELBOW 2V*R*
2 series · 2 of 2 positions shown · non-contrast
Comparison: None.

CLINICAL DATA: Slip and fall injury.  Right humeral pain.

EXAM:
RIGHT ELBOW - 2 VIEW

[elbow ap]
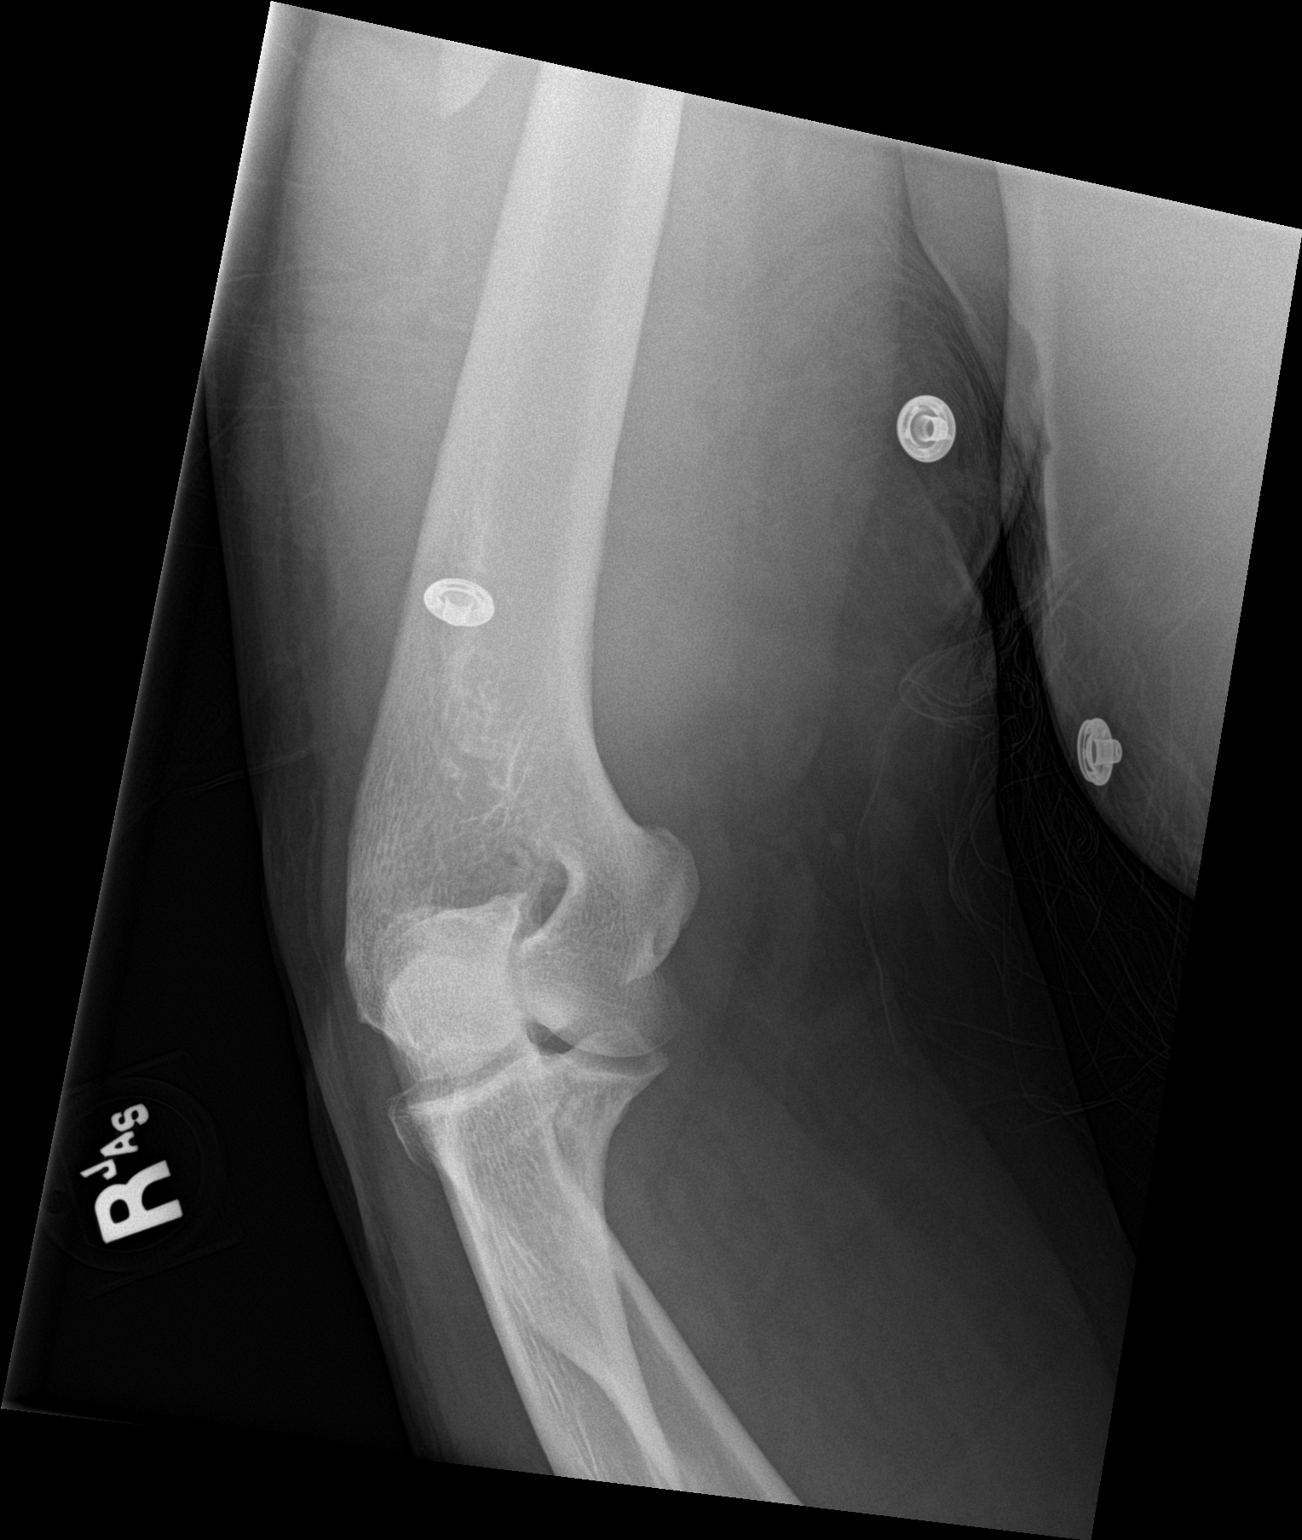

[elbow lat]
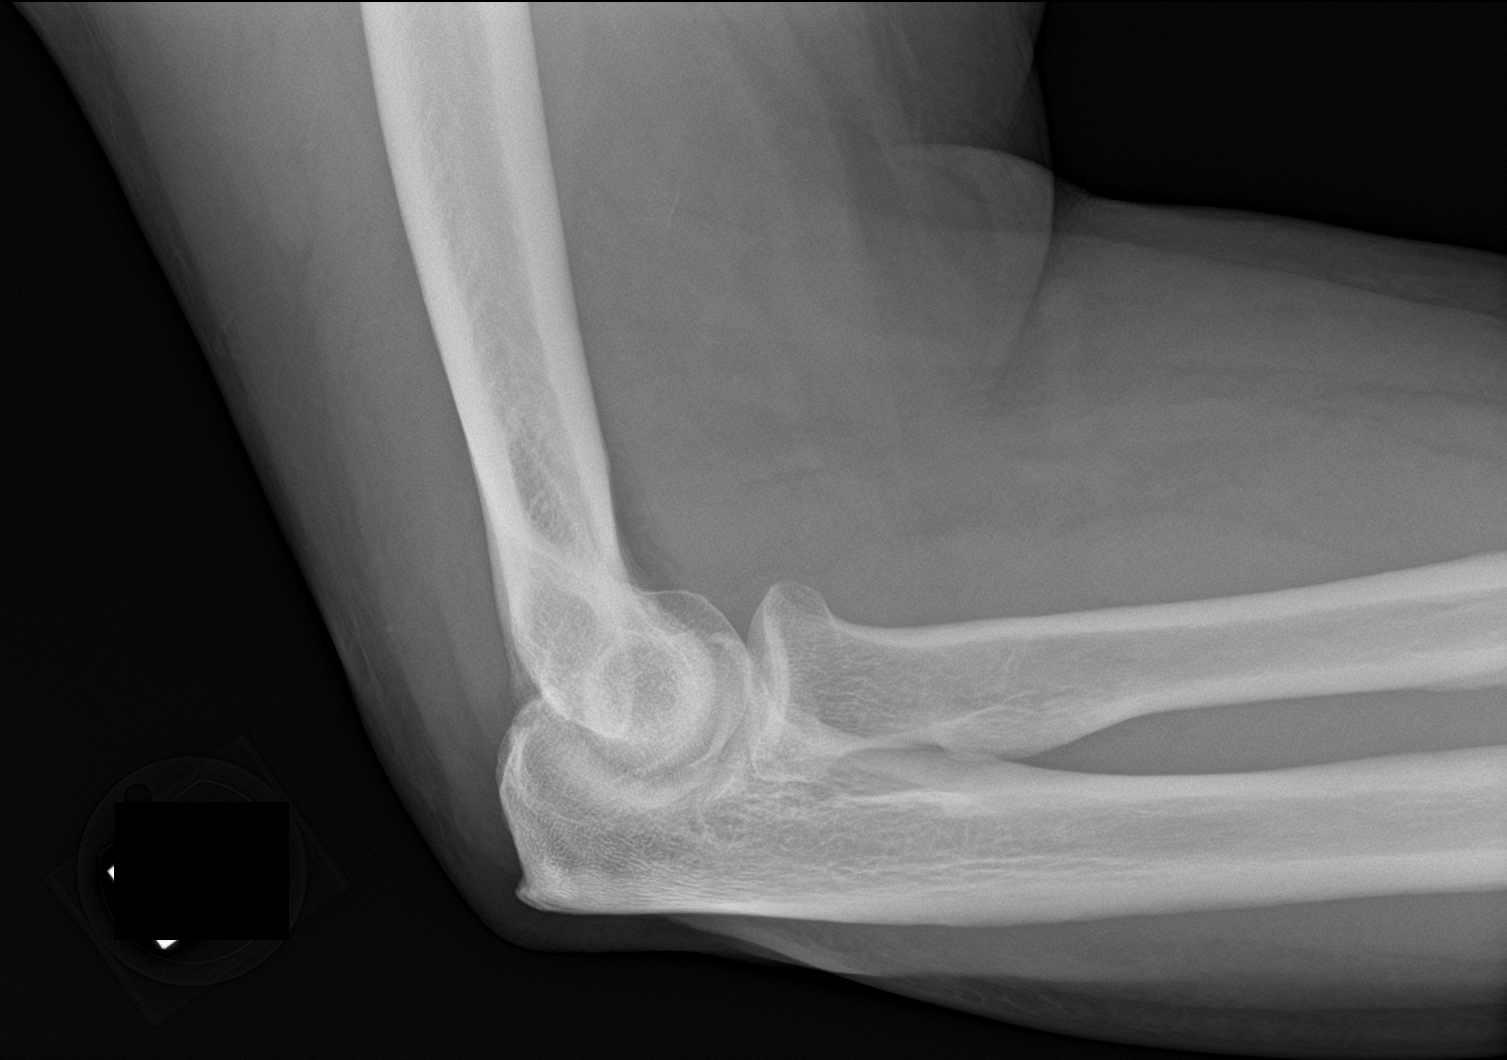

[2 of 2 positions shown; findings below may reference images not displayed]

FINDINGS: There is no evidence of fracture, dislocation, or joint effusion.
There is no evidence of arthropathy or other focal bone abnormality.
Soft tissues are unremarkable.
IMPRESSION: Negative.

## 2018-09-19 ENCOUNTER — Ambulatory Visit (INDEPENDENT_AMBULATORY_CARE_PROVIDER_SITE_OTHER): Payer: Medicare Other | Admitting: Internal Medicine

## 2018-09-19 ENCOUNTER — Encounter: Payer: Self-pay | Admitting: Internal Medicine

## 2018-09-19 VITALS — BP 112/66 | HR 59 | Temp 98.0°F | Wt 307.8 lb

## 2018-09-19 DIAGNOSIS — I110 Hypertensive heart disease with heart failure: Secondary | ICD-10-CM | POA: Diagnosis not present

## 2018-09-19 DIAGNOSIS — T402X5S Adverse effect of other opioids, sequela: Secondary | ICD-10-CM | POA: Diagnosis not present

## 2018-09-19 DIAGNOSIS — E1140 Type 2 diabetes mellitus with diabetic neuropathy, unspecified: Secondary | ICD-10-CM

## 2018-09-19 DIAGNOSIS — K5903 Drug induced constipation: Secondary | ICD-10-CM

## 2018-09-19 DIAGNOSIS — E7800 Pure hypercholesterolemia, unspecified: Secondary | ICD-10-CM

## 2018-09-19 DIAGNOSIS — I480 Paroxysmal atrial fibrillation: Secondary | ICD-10-CM

## 2018-09-19 DIAGNOSIS — G894 Chronic pain syndrome: Secondary | ICD-10-CM | POA: Diagnosis not present

## 2018-09-19 DIAGNOSIS — N521 Erectile dysfunction due to diseases classified elsewhere: Secondary | ICD-10-CM | POA: Diagnosis not present

## 2018-09-19 DIAGNOSIS — I10 Essential (primary) hypertension: Secondary | ICD-10-CM

## 2018-09-19 DIAGNOSIS — E785 Hyperlipidemia, unspecified: Secondary | ICD-10-CM

## 2018-09-19 DIAGNOSIS — J449 Chronic obstructive pulmonary disease, unspecified: Secondary | ICD-10-CM

## 2018-09-19 DIAGNOSIS — Z7901 Long term (current) use of anticoagulants: Secondary | ICD-10-CM

## 2018-09-19 DIAGNOSIS — Z79891 Long term (current) use of opiate analgesic: Secondary | ICD-10-CM

## 2018-09-19 DIAGNOSIS — M1A00X0 Idiopathic chronic gout, unspecified site, without tophus (tophi): Secondary | ICD-10-CM

## 2018-09-19 DIAGNOSIS — Z79899 Other long term (current) drug therapy: Secondary | ICD-10-CM

## 2018-09-19 DIAGNOSIS — I25119 Atherosclerotic heart disease of native coronary artery with unspecified angina pectoris: Secondary | ICD-10-CM | POA: Diagnosis not present

## 2018-09-19 DIAGNOSIS — T402X5A Adverse effect of other opioids, initial encounter: Secondary | ICD-10-CM

## 2018-09-19 DIAGNOSIS — I5032 Chronic diastolic (congestive) heart failure: Secondary | ICD-10-CM | POA: Diagnosis not present

## 2018-09-19 LAB — POCT GLYCOSYLATED HEMOGLOBIN (HGB A1C): Hemoglobin A1C: 6.5 % — AB (ref 4.0–5.6)

## 2018-09-19 LAB — GLUCOSE, CAPILLARY: Glucose-Capillary: 141 mg/dL — ABNORMAL HIGH (ref 70–99)

## 2018-09-19 MED ORDER — OXYCODONE-ACETAMINOPHEN 10-325 MG PO TABS: 1.0000 | ORAL_TABLET | Freq: Four times a day (QID) | ORAL | 0 refills | Status: DC | PRN

## 2018-09-19 NOTE — Assessment & Plan Note (Signed)
Assessment  He denies any symptoms consistent with angina on his current regimen which includes metoprolol 25 mg by mouth twice daily and atorvastatin 20 mg by mouth daily.  Plan  We will continue with the metoprolol at 25 mg by mouth twice daily and atorvastatin at 20 mg by mouth daily and reassess for evidence of angina at the follow-up visit.  We will also continue to aggressively manage his diabetes and hope to improve his weight with diet.

## 2018-09-19 NOTE — Assessment & Plan Note (Signed)
Assessment  His insurance plan would not pay for the magnesium oxide and he has not taken any for the last 2 months.  We did an IT consultant and found over-the-counter magnesium oxide at [employer] called [facility].  There are 300 tablets at 250 mg apiece for $6.98.  He stated he could afford this.  Plan  He was asked to go to [facility] to pick up this bottle of magnesium oxide.  He will need to take 2 tablets 3 times daily.  Therefore, the bottle should last him just under 2 months.  At the follow-up visit we will see if he was able to obtain this medication and had been taking it.  If so, we will reassess the efficacy of this over-the-counter magnesium oxide at maintaining his magnesium level at the follow-up visit.

## 2018-09-19 NOTE — Patient Instructions (Addendum)
It was good to see you today.  You are doing a great job with your health.  1) Keep taking the medications as you are.  2) [facility] has Petra [employer] Made Magnesium Oxide 250 mg tablets, 300 tablets per bottle for $6.98 cents.  If you take 2 tablets three times a day this bottle will last you just under 2 months.  Taking the magnesium is really important because the heart doctor is afraid if it goes low again your atrial fibrillation will act up.  3) Try taking 2 puffs of the albuterol with the spacer before walking to the mailbox to see if that helps with the shortness of breath which I believe is caused by your asthmatic bronchitis.  4) I renewed your percocet for the next 3 months.  Call if you are running out and the pharmacy can not send a notice for a refill.  I will see you back in 6 months, sooner if necessary.

## 2018-09-19 NOTE — Assessment & Plan Note (Signed)
Assessment  He continues to have chronic pain related to his traumatic injury of the left arm and leg.  The pain associated with the prior Brodie's abscess is improved.  His current regimen includes oxycodone-acetaminophen 10-325 mg 1 tablet every 6 hours as needed for pain dispense number 100/month.  This regimen allows him to ambulate and take care of his activities of daily living.  Plan  We will continue the oxycodone-acetaminophen 10-325 mg 1 tablet every 6 hours as needed for pain dispense number 100/month and reassess the efficacy of this regimen at the follow-up visit.

## 2018-09-19 NOTE — Assessment & Plan Note (Signed)
Assessment  His blood pressure today is at target at 112/66.  This is on metoprolol 25 mg by mouth twice daily and terazosin 5 mg by mouth at night.  He is tolerating these medications well.  Plan  We will continue thetrazosint 5 mg by mouth at night and metoprolol at 25 mg by mouth twice daily.  We will reassess the efficacy of this regimen in controlling his blood pressure at the follow-up visit.

## 2018-09-19 NOTE — Assessment & Plan Note (Signed)
Assessment  He notes increased dyspnea with exertion, especially to his mailbox.  He uses the albuterol as needed with some relief although he admits he has not been using the spacer.  I do not believe the increasing dyspnea on exertion is secondary to his chronic diastolic heart failure as he has no orthopnea or paroxysmal nocturnal dyspnea.  Plan  He was provided with another spacer and encouraged to take 2 puffs of the albuterol before walking to his mailbox.  We will then be able to assess if this was efficacious in improving his symptoms of dyspnea on exertion, and if so, would be highly suggestive that the symptoms are secondary to his chronic asthmatic bronchitis.

## 2018-09-19 NOTE — Assessment & Plan Note (Signed)
Assessment  He denies any orthopnea or paroxysmal nocturnal dyspnea.  If anything, these are improved from before.  Thus, the dyspnea on exertion is more likely related to his chronic asthmatic bronchitis rather than decompensation of his chronic diastolic heart failure.  Plan  We will continue risk factor modification by controlling his diabetes, hypertension, and encouraging weight loss in the management of his morbid obesity.  We will reassess for symptoms of acute on chronic diastolic heart failure at the follow-up visit.

## 2018-09-19 NOTE — Progress Notes (Signed)
   Subjective:    Patient ID: [name], male    DOB: Nov 09, 1955, 63 y.o.   MRN: [id number]  HPI  Juan Calderon is here for follow-up of his type 2 diabetes complicated by erectile dysfunction, chronic diastolic heart failure, essential hypertension, hyperlipidemia, chronic pain syndrome, coronary artery disease with stable angina, chronic asthmatic bronchitis, idiopathic chronic gout without tophus, constipation due to opioid therapy, paroxysmal atrial fibrillation, and hypomagensemia. Please see the A&P for the status of the pt's chronic medical problems.  He is without acute complaints at this time.  Review of Systems  Constitutional: Negative for activity change and unexpected weight change.  Respiratory: Positive for shortness of breath. Negative for wheezing.   Cardiovascular: Positive for leg swelling. Negative for chest pain and palpitations.  Gastrointestinal: Positive for constipation. Negative for abdominal pain, diarrhea, nausea and vomiting.  Genitourinary: Negative for difficulty urinating.  Musculoskeletal: Positive for arthralgias, back pain and gait problem. Negative for joint swelling and myalgias.  Skin: Positive for wound.  Psychiatric/Behavioral: Positive for sleep disturbance. Negative for dysphoric mood.      Objective:   Physical Exam Vitals signs and nursing note reviewed.  Constitutional:      General: He is not in acute distress.    Appearance: Normal appearance. He is obese. He is not ill-appearing, toxic-appearing or diaphoretic.  HENT:     Head: Normocephalic and atraumatic.  Eyes:     General: No scleral icterus.       Right eye: No discharge.        Left eye: No discharge.     Conjunctiva/sclera: Conjunctivae normal.  Cardiovascular:     Rate and Rhythm: Normal rate and regular rhythm.     Heart sounds: No murmur. No friction rub. No gallop.   Pulmonary:     Effort: Pulmonary effort is normal. No respiratory distress.     Breath sounds:  Normal breath sounds. No wheezing, rhonchi or rales.  Abdominal:     General: Bowel sounds are normal. There is no distension.     Palpations: Abdomen is soft.     Tenderness: There is no abdominal tenderness. There is no guarding or rebound.  Musculoskeletal:        General: Tenderness and deformity present.     Right lower leg: No edema.     Left lower leg: Edema present.  Skin:    General: Skin is warm and dry.     Coloration: Skin is not jaundiced.     Findings: Lesion present. No bruising or rash.  Neurological:     Mental Status: He is alert and oriented to person, place, and time. Mental status is at baseline.     Gait: Gait abnormal.  Psychiatric:        Mood and Affect: Mood normal.        Behavior: Behavior normal.        Thought Content: Thought content normal.        Judgment: Judgment normal.       Assessment & Plan:   Please see problem oriented charting.

## 2018-09-19 NOTE — Assessment & Plan Note (Signed)
Assessment  The sorbitol has been effective at managing his opioid related constipation.  There is a national back order although he has a supply at home.  Plan  We will continue with the sorbitol for his constipation and reassess the efficacy of this intervention at the follow-up visit.

## 2018-09-19 NOTE — Assessment & Plan Note (Signed)
Assessment  He has had no palpitations on the flecainide 100 mg every 12 hours.  He is also on metoprolol 25 mg by mouth twice daily.  Examination today revealed a regular cardiac rhythm suggesting the paroxysmal atrial fibrillation was not present during today's visit.  Plan  We will continue the flecainide 100 mg by mouth twice daily and metoprolol 25 mg by mouth twice daily should he flip back into atrial fibrillation.  We will reassess the efficacy of this therapy at the follow-up visit.  He will also continue on the Eliquis 5 mg by mouth twice daily for stroke prophylaxis.

## 2018-09-19 NOTE — Assessment & Plan Note (Signed)
Assessment  His diabetes is well controlled on diet alone with a hemoglobin A1c stable at 6.5 today.  Erectile dysfunction has been a longstanding problem and no intervention was added today.  Plan  He was praised for his excellent glycemic control with his diet and was encouraged to continue with his lifestyle modifications that have allowed him to keep his hemoglobin A1c in check.  We will reassess his glycemic control with a repeat hemoglobin A1c at the follow-up visit.

## 2018-09-19 NOTE — Assessment & Plan Note (Signed)
Assessment  He is tolerating the atorvastatin at 20 mg by mouth twice daily without myalgias.  Plan  We will continue with his high intensity statin and reassess for intolerances at the follow-up visit.

## 2018-09-19 NOTE — Assessment & Plan Note (Signed)
He deferred the diabetic foot exam today until the next appointment.  He is otherwise up-to-date on his healthcare maintenance.

## 2018-09-19 NOTE — Assessment & Plan Note (Signed)
Assessment  He has had no gouty flares while on the allopurinol 300 mg by mouth daily.  Plan  We will continue with the allopurinol 300 mg by mouth daily and reassess for interim gouty flares while we manage his hyperuricemia.

## 2018-09-23 NOTE — Telephone Encounter (Signed)
Patient's insurance does not cover OTC medications.  Magnesium Oxide is considered an OTC medication and is not covered.  Sander Nephew, RN 09/23/2018. 10:50 AM

## 2018-09-23 NOTE — Telephone Encounter (Signed)
Addressed in last clinic visit.  Found cheap alternative at [facility] he could afford.  Thank You.

## 2018-10-01 DIAGNOSIS — H2513 Age-related nuclear cataract, bilateral: Secondary | ICD-10-CM | POA: Diagnosis not present

## 2018-10-01 DIAGNOSIS — H40023 Open angle with borderline findings, high risk, bilateral: Secondary | ICD-10-CM | POA: Diagnosis not present

## 2018-10-13 DIAGNOSIS — H2511 Age-related nuclear cataract, right eye: Secondary | ICD-10-CM | POA: Diagnosis not present

## 2018-10-13 DIAGNOSIS — H25811 Combined forms of age-related cataract, right eye: Secondary | ICD-10-CM | POA: Diagnosis not present

## 2018-10-24 ENCOUNTER — Other Ambulatory Visit: Payer: Self-pay | Admitting: Internal Medicine

## 2018-10-24 DIAGNOSIS — K219 Gastro-esophageal reflux disease without esophagitis: Secondary | ICD-10-CM

## 2018-10-26 ENCOUNTER — Other Ambulatory Visit: Payer: Self-pay | Admitting: Internal Medicine

## 2018-10-26 DIAGNOSIS — N521 Erectile dysfunction due to diseases classified elsewhere: Secondary | ICD-10-CM

## 2018-10-26 DIAGNOSIS — E1140 Type 2 diabetes mellitus with diabetic neuropathy, unspecified: Secondary | ICD-10-CM

## 2018-10-26 DIAGNOSIS — I480 Paroxysmal atrial fibrillation: Secondary | ICD-10-CM

## 2018-11-03 DIAGNOSIS — H2512 Age-related nuclear cataract, left eye: Secondary | ICD-10-CM | POA: Diagnosis not present

## 2018-11-10 DIAGNOSIS — H25812 Combined forms of age-related cataract, left eye: Secondary | ICD-10-CM | POA: Diagnosis not present

## 2018-11-10 DIAGNOSIS — H2512 Age-related nuclear cataract, left eye: Secondary | ICD-10-CM | POA: Diagnosis not present

## 2018-11-24 ENCOUNTER — Other Ambulatory Visit: Payer: Self-pay | Admitting: Cardiovascular Disease

## 2018-11-24 NOTE — Telephone Encounter (Signed)
Pt is a 63 yr old male who last saw Dr. [name] on 06/25/18. Last noted weight on 09/19/18 was 139.6Kg. SCr on 07/29/18 was 1.36. Will refill Eliquis 5mg  BID.

## 2018-11-30 ENCOUNTER — Encounter: Payer: Self-pay | Admitting: *Deleted

## 2018-12-01 ENCOUNTER — Telehealth: Payer: Self-pay

## 2018-12-01 NOTE — Telephone Encounter (Signed)
Called pt regarding 4/1 appt in order to set up virtual visit instead. Pt stated he does not have access to do a virtual visit at this time but was agreeable to do a PHONE visit. Pt verbally consented over the phone for the following. Appt remains at 4/1 4pm PHONE visit.      Virtual Visit Pre-Appointment Phone Call  Steps For Call:  1. Confirm consent - "In the setting of the current Covid19 crisis, you are scheduled for a PHONE visit with your provider on 12-03-2018 at 4pm.  Just as we do with many in-office visits, in order for you to participate in this visit, we must obtain consent.  If you'd like, I can send this to your mychart (if signed up) or email for you to review.  Otherwise, I can obtain your verbal consent now.  All virtual visits are billed to your insurance company just like a normal visit would be.  By agreeing to a virtual visit, we'd like you to understand that the technology does not allow for your provider to perform an examination, and thus may limit your provider's ability to fully assess your condition.  Finally, though the technology is pretty good, we cannot assure that it will always work on either your or our end, and in the setting of a video visit, we may have to convert it to a phone-only visit.  In either situation, we cannot ensure that we have a secure connection.  Are you willing to proceed?"  2. Give patient instructions for WebEx download to smartphone as below if video visit  3. Advise patient to be prepared with any vital sign or heart rhythm information, their current medicines, and a piece of paper and pen handy for any instructions they may receive the day of their visit  4. Inform patient they will receive a phone call 15 minutes prior to their appointment time (may be from unknown caller ID) so they should be prepared to answer  5. Confirm that appointment type is correct in Epic appointment notes (video vs telephone)    TELEPHONE CALL NOTE  TIAGO HUMPHREY has been deemed a candidate for a follow-up tele-health visit to limit community exposure during the Covid-19 pandemic. I spoke with the patient via phone to ensure availability of phone/video source, confirm preferred email & phone number, and discuss instructions and expectations.  I reminded LENOX BINK to be prepared with any vital sign and/or heart rhythm information that could potentially be obtained via home monitoring, at the time of his visit. I reminded SHEP PORTER to expect a phone call at the time of his visit if his visit.  Did the patient verbally acknowledge consent to treatment? YES  Wilma Flavin, RN 12/01/2018 12:43 PM

## 2018-12-02 ENCOUNTER — Other Ambulatory Visit: Payer: Self-pay

## 2018-12-02 DIAGNOSIS — G894 Chronic pain syndrome: Secondary | ICD-10-CM

## 2018-12-02 MED ORDER — OXYCODONE-ACETAMINOPHEN 10-325 MG PO TABS: 1.0000 | ORAL_TABLET | Freq: Four times a day (QID) | ORAL | 0 refills | Status: DC | PRN

## 2018-12-02 NOTE — Telephone Encounter (Signed)
oxyCODONE-acetaminophen (PERCOCET) 10-325 MG tablet   Refill request @  [id number] [address], [place] [phone] (Phone) [phone] (Fax)

## 2018-12-02 NOTE — Telephone Encounter (Signed)
Spoke to patient-he is aware that next refill is not due until 12/19/18 (confirmed with pharmacy).  Pt calling ahead to have refills added to profile at pharmacy.  Pt informed his pcp has been changed to [facility]  Will send request to pcp for review.  Can you send rx to pharmacy and add "do not fill prior to 4/17" comment?  Please advise.Juan Hidden Cassady3/31/202010:46 AM

## 2018-12-02 NOTE — Telephone Encounter (Signed)
Ok.  I sent 2 prescriptions to the [facility] on file.  One may be filled after 4/16, and the next may be filled after 5/15.  This should be a 64-month supply.  Hopefully we will be able to do an person visit in late May or early June for next refills.

## 2018-12-03 ENCOUNTER — Telehealth (INDEPENDENT_AMBULATORY_CARE_PROVIDER_SITE_OTHER): Payer: Medicare Other | Admitting: Cardiovascular Disease

## 2018-12-03 ENCOUNTER — Other Ambulatory Visit: Payer: Self-pay

## 2018-12-03 ENCOUNTER — Encounter: Payer: Self-pay | Admitting: Cardiovascular Disease

## 2018-12-03 VITALS — Ht 72.0 in | Wt 305.0 lb

## 2018-12-03 DIAGNOSIS — I110 Hypertensive heart disease with heart failure: Secondary | ICD-10-CM

## 2018-12-03 DIAGNOSIS — I2510 Atherosclerotic heart disease of native coronary artery without angina pectoris: Secondary | ICD-10-CM | POA: Diagnosis not present

## 2018-12-03 DIAGNOSIS — I5032 Chronic diastolic (congestive) heart failure: Secondary | ICD-10-CM

## 2018-12-03 DIAGNOSIS — I493 Ventricular premature depolarization: Secondary | ICD-10-CM | POA: Diagnosis not present

## 2018-12-03 DIAGNOSIS — I480 Paroxysmal atrial fibrillation: Secondary | ICD-10-CM | POA: Diagnosis not present

## 2018-12-03 DIAGNOSIS — I10 Essential (primary) hypertension: Secondary | ICD-10-CM

## 2018-12-03 NOTE — Progress Notes (Signed)
Virtual Visit via Telephone Note    Evaluation Performed:  Follow-up visit  This visit type was conducted due to national recommendations for restrictions regarding the COVID-19 Pandemic (e.g. social distancing).  This format is felt to be most appropriate for this patient at this time.  All issues noted in this document were discussed and addressed.  No physical exam was performed (except for noted visual exam findings with Video Visits).  Please refer to the patient's chart (MyChart message for video visits and phone note for telephone visits) for the patient's consent to telehealth for [facility].  Date:  12/03/2018   ID:  Juan, Calderon 1955/11/03, MRN [id number]  Patient Location:  [facility]  Provider location:   [facility] office  PCP:  Juan Filler, MD  Cardiologist:  No primary care provider on file. [place] Electrophysiologist:  None   Chief Complaint:  Follow up/CAD  History of Present Illness:    Juan Calderon is a 63 y.o. male who presents via audio/video conferencing for a telehealth visit today.   63 yo male with history of chronic diastolic CHF, CAD, persistent atrial fibrillation, bradycardia, frequent PVCs, NSVT, COPD, HTN, HLD, gout, GERD, DM, chronic venous stasis, morbid obesity and chronic dyspnea who is being seen today by e-visit due to the Covid 19 pandemic. He had been followed by Dr. Darron Calderon At [facility] in [place], [place]. Echo June 2011 in [place], [place] with mild LVH, EF of 55%. mild LAE. Monitor April 2012 in [place], [place] showed NSR with episodes of sinus brady, rare PVCs, occasional PACs and brief episodes of atrial tachycardia. Per records he has been on Multaq in the past but did not like this medication so it was stopped. He had a stress myoview 2007 that showed reversible ischemia in the inferior wall. This led to a cardiac cath on 10/01/05 which showed 25% mid LAD stenosis per report but no other evidence  of CAD.  Admitted October 2015 with volume overload and echo showed normal systolic function EF 64%. He diuresed down to his dry weight of 304 pounds. Metoprolol was decreased to 50 mg twice a day because of nocturnal bradycardia with rates in the 30s. Digoxin was discontinued. He was seen in March 2016 and had c/o dyspnea on exertion. Stress myoview 11/25/14 with no ischemia. 24 hour monitor showed atrial fib with bradycardia (rates as low as 40 bpm), frequent PVCs (9000 in 24 hours) and non-sustained VT. He was seen by Dr. Caryl Calderon 12/23/14 and was started on Flecainide. He underwent DCCV on 12/30/14. Echo May 2016 with LVEF=55-60%. He tried CPAP but did not tolerate. He has been followed in our EP clinic by Dr. Caryl Calderon over the past few years and has remained on Flecainide and Eliquis.   He tells me today that he has baseline dyspnea. He has no chest pain. Some weight gain and slight worsening of LE edema.   The patient does not have symptoms concerning for COVID-19 infection (fever, chills, cough, or new shortness of breath).    Prior CV studies:   The following studies were reviewed today  Past Medical History:  Diagnosis Date   Alcohol abuse     Bradycardia    C6 radiculopathy 01/24/2016   Right upper extremity, mild to moderate electrically by EMG on 01/24/2016   Cataract    Left eye   Chronic diastolic heart failure ([place])     with mild left ventricular hypertrophy on Echo  02/2010   Chronic obstructive pulmonary disease (HCC)     Chronic osteomyelitis of femur ([place]) 04/06/2016   Chronic osteomyelitis of left femur ([place]) 11/22/2017   Left femur s/p prior trauma   Chronic pain syndrome     Left arm and leg s/p traumatic injury    Chronic renal insufficiency     Coronary artery disease     25% LAD stenosis on cath 2007.  Stable angina.   Diverticulosis     Essential hypertension     Frequent PVCs    Gastroesophageal reflux disease     Gout     Hyperlipidemia LDL goal < 100       Long-term current use of opiate analgesic 09/07/2016   Mild carpal tunnel syndrome of right wrist 01/24/2016   Mild degree electrically per EMG 01/24/2016    Morbid obesity with BMI of 40.0-44.9, adult (HCC)     Normocytic anemia     NSVT (nonsustained ventricular tachycardia) (HCC)    Obstructive sleep apnea     Moderate, AHI 29.8 per hour with moderately loud snoring and oxygen desaturation to a nadir of 79%. CPAP titration resulted in a prescription for 17 CWP.     Open-angle glaucoma     Osteoarthritis cervical spine     Osteoarthritis of left knee 06/19/2013   Tricompartmental disease.  Treated with double hinged upright knee brace, steroid/xylocaine knee injections, and NSAIDs    Osteoporosis 05/14/2017   s/p fracture of the right humerus from a fall at ground hight   Persistent atrial fibrillation    Right rotator cuff tear     Large full-thickness tear of the supraspinatus with mild retraction but no atrophy    Secondary male hypogonadism 02/07/2017   Likely secondary to chronic opioid use   Subclinical hypothyroidism     Tubular adenoma of colon 11/22/2017   Specifics unknown.  Repeat colonoscopy 08/12/2018 with six 3-6 mm tubular adenomas removed endoscopically.   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 [facility])     Past Surgical History:  Procedure Laterality Date   CARDIOVERSION N/A 12/30/2014   Procedure: CARDIOVERSION;  Surgeon: [name], MD;  Location: [facility] ENDOSCOPY;  Service: Cardiovascular;  Laterality: N/A;   FRACTURE SURGERY Left 1980's   Elbow   Left arm surgery     Left leg surgery     SHOULDER SURGERY     Right     Current Meds  Medication Sig   albuterol (PROVENTIL HFA;VENTOLIN HFA) 108 (90 Base) MCG/ACT inhaler Inhale 1-2 puffs into the lungs every 6 (six) hours as needed for shortness of breath.   allopurinol (ZYLOPRIM) 300 MG tablet Take 1 tablet (300 mg total) by mouth daily. Please note the change in  the number of tablets to take daily.   atorvastatin (LIPITOR) 20 MG tablet Take 1 tablet (20 mg total) by mouth daily.   Blood Glucose Monitoring Suppl (ONETOUCH VERIO) w/Device KIT 1 each by Does not apply route daily.   Dextromethorphan-Guaifenesin 20-400 MG/5ML SYRP Take 5 mLs by mouth every 4 (four) hours as needed (cough).   ELIQUIS 5 MG TABS tablet TAKE 1 TABLET BY MOUTH TWICE A DAY   esomeprazole (NEXIUM) 40 MG capsule Take 1 capsule (40 mg total) by mouth daily.   flecainide (TAMBOCOR) 100 MG tablet Take 1 tablet (100 mg total) by mouth every 12 (twelve) hours.   glucose blood (ONETOUCH VERIO) test strip Use as instructed   latanoprost (XALATAN) 0.005 % ophthalmic solution  Place 1 drop into both eyes at bedtime.   Loteprednol Etabonate (LOTEMAX) 0.5 % GEL Apply 0.5 % to eye daily. 4 times/day in left eye; 2 times/day in right eye   magnesium oxide (MAG-OX) 400 MG tablet Take 2 tablets by mouth 2 (two) times daily.   metFORMIN (GLUCOPHAGE) 1000 MG tablet Take 1 tablet (1,000 mg total) by mouth 2 (two) times daily with a meal.   metoprolol tartrate (LOPRESSOR) 25 MG tablet TAKE 1 TABLET BY MOUTH TWICE A DAY   nitroGLYCERIN (NITROSTAT) 0.4 MG SL tablet Place 1 tablet (0.4 mg total) under the tongue every 5 (five) minutes as needed for chest pain.   ONETOUCH DELICA LANCETS 51W MISC Use 1 strip daily   [START ON 01/16/2019] oxyCODONE-acetaminophen (PERCOCET) 10-325 MG tablet Take 1 tablet by mouth every 6 (six) hours as needed for pain.   Sorbitol 70 % SOLN Take 15-60 mLs by mouth daily as needed.   terazosin (HYTRIN) 5 MG capsule Take 1 capsule (5 mg total) by mouth at bedtime.   torsemide (DEMADEX) 20 MG tablet TAKE 2 TABLETS BY MOUTH 3 TIMES PER WEEK     Allergies:   Ramipril; Tape; and Testosterone   Social History   Tobacco Use   Smoking status: Never Smoker   Smokeless tobacco: Never Used  Substance Use Topics   Alcohol use: Yes    Alcohol/week: 14.0  standard drinks    Types: 14 Cans of beer per week   Drug use: No     Family Hx: The patient's family history includes Alzheimer's disease in his father; Early death in his brother; Heart failure in his brother and mother; Hypertension in his brother and sister; Osteoarthritis in his brother; Prostate cancer in his brother. There is no history of Heart attack, Stroke, Colon cancer, Esophageal cancer, Pancreatic cancer, Stomach cancer, or Liver disease.  ROS:   Please see the history of present illness.    All other systems reviewed and are negative.   Labs/Other Tests and Data Reviewed:    Recent Labs: 03/25/2018: Magnesium 1.7 07/29/2018: BUN 19; Creatinine, Ser 1.36; Hemoglobin 12.2; Platelets 238; Potassium 3.8; Sodium 142   Recent Lipid Panel Lab Results  Component Value Date/Time   CHOL 108 08/13/2014 10:46 AM   TRIG 93 08/13/2014 10:46 AM   HDL 55 08/13/2014 10:46 AM   CHOLHDL 2.0 08/13/2014 10:46 AM   LDLCALC 34 08/13/2014 10:46 AM    Wt Readings from Last 3 Encounters:  12/03/18 (!) 305 lb (138.3 kg)  09/19/18 (!) 307 lb 12.8 oz (139.6 kg)  08/12/18 (!) 302 lb (137 kg)     Objective:    Vital Signs:  Ht 6' (1.829 m)    Wt (!) 305 lb (138.3 kg)    BMI 41.37 kg/m      ASSESSMENT & PLAN:     1. Chronic Diastolic CHF: Weight is overall stable per pt with slight increase in weight and LE edema. He will take an extra Torsemide several days per week. We have attempted more diuresis in the past but he develops renal failure quickly.   2. Atrial Fibrillation: He remains on metoprolol, flecainide and Eliquis. No bleeding issues. No changes today.   3. Hypertension: BP is controlled at home per pt.   4. CAD without angina: Continue beta blocker and statin. No ASA since he is also on Eliquis.   5. Chronic kidney disease  6. Dyspnea: Multifactorial. His morbid obesity most certainly is contributing to his dyspnea. He  is deconditioned. He has underlying lung disease  with PFTs in 2014 showing severe obstructive and severe restrictive airways disease. This is all worsened by chronic diastolic CHF. Extra Torsemide as needed.    COVID-19 Education: The signs and symptoms of COVID-19 were discussed with the patient and how to seek care for testing (follow up with PCP or arrange E-visit).  The importance of social distancing was discussed today.  Patient Risk:   After full review of this patient's clinical status, I feel that they are at least moderate risk at this time.  Time:   Today, I have spent 15 minutes with the patient with telehealth technology discussing his CAD and PAF.     Medication Adjustments/Labs and Tests Ordered: Current medicines are reviewed at length with the patient today.  Concerns regarding medicines are outlined above.  Tests Ordered: No orders of the defined types were placed in this encounter.  Medication Changes: No orders of the defined types were placed in this encounter.   Disposition:  Follow up in 12 month(s)  Follow up EP in 6 months.   Signed, Lauree Chandler, MD  12/03/2018 4:07 PM

## 2018-12-03 NOTE — Patient Instructions (Addendum)
Medication Instructions:  Per Dr. Angelena Form, ok to take one extra torsemide as needed  If you need a refill on your cardiac medications before your next appointment, please call your pharmacy.   Lab work: none If you have labs (blood work) drawn today and your tests are completely normal, you will receive your results only by: [name] MyChart Message (if you have MyChart) OR . A paper copy in the mail If you have any lab test that is abnormal or we need to change your treatment, we will call you to review the results.  Testing/Procedures: none  Follow-Up: At [facility], you and your health needs are our priority.  As part of our continuing mission to provide you with exceptional heart care, we have created designated Provider Care Teams.  These Care Teams include your primary Cardiologist (physician) and Advanced Practice Providers (APPs -  Physician Assistants and Nurse Practitioners) who all work together to provide you with the care you need, when you need it. You will need a follow up appointment in 12 months.  Please call our office 2 months in advance to schedule this appointment.  You may see Lauree Chandler, MD or one of the following Advanced Practice Providers on your designated Care Team:   [place], PA-C Melina Copa, PA-C . Ermalinda Barrios, PA-C   Any Other Special Instructions Will Be Listed Below (If Applicable). We will also arrange for you to have follow up with Dr. [name] or one of the electrophysiology physician assistant or nurse practitioner for in about 6 months.

## 2019-01-27 ENCOUNTER — Telehealth: Payer: Self-pay

## 2019-01-27 NOTE — Telephone Encounter (Signed)

## 2019-02-13 ENCOUNTER — Other Ambulatory Visit: Payer: Self-pay | Admitting: Student in an Organized Health Care Education/Training Program

## 2019-02-13 NOTE — Telephone Encounter (Signed)
Refill Request ? ?oxyCODONE-acetaminophen (PERCOCET) 10-325 MG tablet ? ? ?[id number] - [place], [place] - [address] ?

## 2019-02-15 MED ORDER — OXYCODONE-ACETAMINOPHEN 10-325 MG PO TABS: 1.0000 | ORAL_TABLET | Freq: Four times a day (QID) | ORAL | 0 refills | Status: DC | PRN

## 2019-02-15 NOTE — Telephone Encounter (Signed)
Reviewed database. Approved refill. Please arrange for a follow up appointment with me in July. Thanks!

## 2019-02-19 ENCOUNTER — Other Ambulatory Visit: Payer: Self-pay | Admitting: *Deleted

## 2019-02-19 DIAGNOSIS — I5032 Chronic diastolic (congestive) heart failure: Secondary | ICD-10-CM

## 2019-02-19 MED ORDER — TORSEMIDE 20 MG PO TABS: ORAL_TABLET | ORAL | 3 refills | Status: DC

## 2019-02-19 NOTE — Telephone Encounter (Signed)
Next appt scheduled 7/20 with PCP.

## 2019-03-23 ENCOUNTER — Inpatient Hospital Stay (HOSPITAL_COMMUNITY): Payer: Medicare Other

## 2019-03-23 ENCOUNTER — Encounter: Payer: Self-pay | Admitting: Student in an Organized Health Care Education/Training Program

## 2019-03-23 ENCOUNTER — Ambulatory Visit (HOSPITAL_COMMUNITY)
Admission: RE | Admit: 2019-03-23 | Discharge: 2019-03-23 | Disposition: A | Discharge status: Home / Self Care | Payer: Medicare Other | Source: Ambulatory Visit | Attending: Student in an Organized Health Care Education/Training Program | Admitting: Student in an Organized Health Care Education/Training Program

## 2019-03-23 ENCOUNTER — Inpatient Hospital Stay (HOSPITAL_COMMUNITY)
Admission: AD | Admit: 2019-03-23 | Discharge: 2019-03-25 | DRG: 274 | Disposition: A | Discharge status: Home / Self Care | Payer: Medicare Other | Source: Ambulatory Visit | Attending: Internal Medicine | Admitting: Internal Medicine

## 2019-03-23 ENCOUNTER — Other Ambulatory Visit: Payer: Self-pay

## 2019-03-23 ENCOUNTER — Ambulatory Visit (INDEPENDENT_AMBULATORY_CARE_PROVIDER_SITE_OTHER): Payer: Medicare Other | Admitting: Student in an Organized Health Care Education/Training Program

## 2019-03-23 VITALS — BP 135/80 | HR 85 | Temp 98.0°F | Ht 69.5 in | Wt 319.4 lb

## 2019-03-23 DIAGNOSIS — M47812 Spondylosis without myelopathy or radiculopathy, cervical region: Secondary | ICD-10-CM

## 2019-03-23 DIAGNOSIS — N189 Chronic kidney disease, unspecified: Secondary | ICD-10-CM | POA: Diagnosis present

## 2019-03-23 DIAGNOSIS — E1140 Type 2 diabetes mellitus with diabetic neuropathy, unspecified: Secondary | ICD-10-CM

## 2019-03-23 DIAGNOSIS — R0602 Shortness of breath: Secondary | ICD-10-CM

## 2019-03-23 DIAGNOSIS — I483 Typical atrial flutter: Principal | ICD-10-CM | POA: Diagnosis present

## 2019-03-23 DIAGNOSIS — Z7982 Long term (current) use of aspirin: Secondary | ICD-10-CM | POA: Diagnosis not present

## 2019-03-23 DIAGNOSIS — I441 Atrioventricular block, second degree: Secondary | ICD-10-CM | POA: Insufficient documentation

## 2019-03-23 DIAGNOSIS — M810 Age-related osteoporosis without current pathological fracture: Secondary | ICD-10-CM | POA: Diagnosis present

## 2019-03-23 DIAGNOSIS — Z7901 Long term (current) use of anticoagulants: Secondary | ICD-10-CM

## 2019-03-23 DIAGNOSIS — I5032 Chronic diastolic (congestive) heart failure: Secondary | ICD-10-CM

## 2019-03-23 DIAGNOSIS — N529 Male erectile dysfunction, unspecified: Secondary | ICD-10-CM | POA: Diagnosis present

## 2019-03-23 DIAGNOSIS — R079 Chest pain, unspecified: Secondary | ICD-10-CM | POA: Diagnosis not present

## 2019-03-23 DIAGNOSIS — I25110 Atherosclerotic heart disease of native coronary artery with unstable angina pectoris: Secondary | ICD-10-CM

## 2019-03-23 DIAGNOSIS — Z79899 Other long term (current) drug therapy: Secondary | ICD-10-CM

## 2019-03-23 DIAGNOSIS — R011 Cardiac murmur, unspecified: Secondary | ICD-10-CM

## 2019-03-23 DIAGNOSIS — M1712 Unilateral primary osteoarthritis, left knee: Secondary | ICD-10-CM | POA: Diagnosis present

## 2019-03-23 DIAGNOSIS — N521 Erectile dysfunction due to diseases classified elsewhere: Secondary | ICD-10-CM

## 2019-03-23 DIAGNOSIS — I208 Other forms of angina pectoris: Secondary | ICD-10-CM

## 2019-03-23 DIAGNOSIS — M109 Gout, unspecified: Secondary | ICD-10-CM | POA: Diagnosis present

## 2019-03-23 DIAGNOSIS — R001 Bradycardia, unspecified: Secondary | ICD-10-CM | POA: Diagnosis present

## 2019-03-23 DIAGNOSIS — R0609 Other forms of dyspnea: Secondary | ICD-10-CM

## 2019-03-23 DIAGNOSIS — Z91048 Other nonmedicinal substance allergy status: Secondary | ICD-10-CM

## 2019-03-23 DIAGNOSIS — I480 Paroxysmal atrial fibrillation: Secondary | ICD-10-CM

## 2019-03-23 DIAGNOSIS — I130 Hypertensive heart and chronic kidney disease with heart failure and stage 1 through stage 4 chronic kidney disease, or unspecified chronic kidney disease: Secondary | ICD-10-CM | POA: Diagnosis present

## 2019-03-23 DIAGNOSIS — I4819 Other persistent atrial fibrillation: Secondary | ICD-10-CM | POA: Diagnosis not present

## 2019-03-23 DIAGNOSIS — E039 Hypothyroidism, unspecified: Secondary | ICD-10-CM | POA: Diagnosis present

## 2019-03-23 DIAGNOSIS — M1A00X0 Idiopathic chronic gout, unspecified site, without tophus (tophi): Secondary | ICD-10-CM

## 2019-03-23 DIAGNOSIS — Z872 Personal history of diseases of the skin and subcutaneous tissue: Secondary | ICD-10-CM

## 2019-03-23 DIAGNOSIS — Z8249 Family history of ischemic heart disease and other diseases of the circulatory system: Secondary | ICD-10-CM

## 2019-03-23 DIAGNOSIS — I25118 Atherosclerotic heart disease of native coronary artery with other forms of angina pectoris: Secondary | ICD-10-CM | POA: Diagnosis not present

## 2019-03-23 DIAGNOSIS — J449 Chronic obstructive pulmonary disease, unspecified: Secondary | ICD-10-CM | POA: Diagnosis present

## 2019-03-23 DIAGNOSIS — K219 Gastro-esophageal reflux disease without esophagitis: Secondary | ICD-10-CM | POA: Diagnosis present

## 2019-03-23 DIAGNOSIS — G4733 Obstructive sleep apnea (adult) (pediatric): Secondary | ICD-10-CM | POA: Diagnosis present

## 2019-03-23 DIAGNOSIS — F1010 Alcohol abuse, uncomplicated: Secondary | ICD-10-CM

## 2019-03-23 DIAGNOSIS — E1122 Type 2 diabetes mellitus with diabetic chronic kidney disease: Secondary | ICD-10-CM | POA: Diagnosis present

## 2019-03-23 DIAGNOSIS — Z6841 Body Mass Index (BMI) 40.0 and over, adult: Secondary | ICD-10-CM | POA: Diagnosis not present

## 2019-03-23 DIAGNOSIS — I5030 Unspecified diastolic (congestive) heart failure: Secondary | ICD-10-CM | POA: Diagnosis not present

## 2019-03-23 DIAGNOSIS — I4892 Unspecified atrial flutter: Secondary | ICD-10-CM

## 2019-03-23 DIAGNOSIS — E119 Type 2 diabetes mellitus without complications: Secondary | ICD-10-CM | POA: Diagnosis not present

## 2019-03-23 DIAGNOSIS — I200 Unstable angina: Secondary | ICD-10-CM | POA: Insufficient documentation

## 2019-03-23 DIAGNOSIS — Z888 Allergy status to other drugs, medicaments and biological substances status: Secondary | ICD-10-CM

## 2019-03-23 DIAGNOSIS — E785 Hyperlipidemia, unspecified: Secondary | ICD-10-CM | POA: Diagnosis present

## 2019-03-23 DIAGNOSIS — Z833 Family history of diabetes mellitus: Secondary | ICD-10-CM

## 2019-03-23 DIAGNOSIS — Z1159 Encounter for screening for other viral diseases: Secondary | ICD-10-CM

## 2019-03-23 DIAGNOSIS — I878 Other specified disorders of veins: Secondary | ICD-10-CM | POA: Diagnosis present

## 2019-03-23 DIAGNOSIS — Z79891 Long term (current) use of opiate analgesic: Secondary | ICD-10-CM | POA: Diagnosis not present

## 2019-03-23 DIAGNOSIS — M86652 Other chronic osteomyelitis, left thigh: Secondary | ICD-10-CM | POA: Diagnosis not present

## 2019-03-23 DIAGNOSIS — Z8261 Family history of arthritis: Secondary | ICD-10-CM

## 2019-03-23 DIAGNOSIS — I447 Left bundle-branch block, unspecified: Secondary | ICD-10-CM

## 2019-03-23 DIAGNOSIS — G8929 Other chronic pain: Secondary | ICD-10-CM

## 2019-03-23 DIAGNOSIS — G894 Chronic pain syndrome: Secondary | ICD-10-CM | POA: Diagnosis present

## 2019-03-23 DIAGNOSIS — I4891 Unspecified atrial fibrillation: Secondary | ICD-10-CM | POA: Diagnosis not present

## 2019-03-23 DIAGNOSIS — I484 Atypical atrial flutter: Secondary | ICD-10-CM

## 2019-03-23 DIAGNOSIS — M5412 Radiculopathy, cervical region: Secondary | ICD-10-CM | POA: Diagnosis present

## 2019-03-23 DIAGNOSIS — Z7984 Long term (current) use of oral hypoglycemic drugs: Secondary | ICD-10-CM

## 2019-03-23 DIAGNOSIS — Z7902 Long term (current) use of antithrombotics/antiplatelets: Secondary | ICD-10-CM

## 2019-03-23 DIAGNOSIS — I2510 Atherosclerotic heart disease of native coronary artery without angina pectoris: Secondary | ICD-10-CM | POA: Diagnosis not present

## 2019-03-23 DIAGNOSIS — I110 Hypertensive heart disease with heart failure: Secondary | ICD-10-CM | POA: Diagnosis not present

## 2019-03-23 LAB — CBC WITH DIFFERENTIAL/PLATELET
Abs Immature Granulocytes: 0.02 10*3/uL (ref 0.00–0.07)
Basophils Absolute: 0 10*3/uL (ref 0.0–0.1)
Basophils Relative: 0 %
Eosinophils Absolute: 0.1 10*3/uL (ref 0.0–0.5)
Eosinophils Relative: 1 %
HCT: 41.6 % (ref 39.0–52.0)
Hemoglobin: 13.5 g/dL (ref 13.0–17.0)
Immature Granulocytes: 0 %
Lymphocytes Relative: 19 %
Lymphs Abs: 1.3 10*3/uL (ref 0.7–4.0)
MCH: 31.8 pg (ref 26.0–34.0)
MCHC: 32.5 g/dL (ref 30.0–36.0)
MCV: 97.9 fL (ref 80.0–100.0)
Monocytes Absolute: 0.4 10*3/uL (ref 0.1–1.0)
Monocytes Relative: 7 %
Neutro Abs: 5 10*3/uL (ref 1.7–7.7)
Neutrophils Relative %: 73 %
Platelets: 164 10*3/uL (ref 150–400)
RBC: 4.25 MIL/uL (ref 4.22–5.81)
RDW: 13.3 % (ref 11.5–15.5)
WBC: 6.8 10*3/uL (ref 4.0–10.5)
nRBC: 0 % (ref 0.0–0.2)

## 2019-03-23 LAB — LIPID PANEL
Cholesterol: 95 mg/dL (ref 0–200)
HDL: 48 mg/dL (ref >40)
LDL Cholesterol: 23 mg/dL (ref 0–99)
Total CHOL/HDL Ratio: 2 RATIO
Triglycerides: 119 mg/dL (ref <150)
VLDL: 24 mg/dL (ref 0–40)

## 2019-03-23 LAB — SARS CORONAVIRUS 2 BY RT PCR (HOSPITAL ORDER, PERFORMED IN ~~LOC~~ HOSPITAL LAB): SARS Coronavirus 2: NEGATIVE

## 2019-03-23 LAB — GLUCOSE, CAPILLARY
Glucose-Capillary: 185 mg/dL — ABNORMAL HIGH (ref 70–99)
Glucose-Capillary: 130 mg/dL — ABNORMAL HIGH (ref 70–99)
Glucose-Capillary: 103 mg/dL — ABNORMAL HIGH (ref 70–99)

## 2019-03-23 LAB — COMPREHENSIVE METABOLIC PANEL
ALT: 30 U/L (ref 0–44)
AST: 23 U/L (ref 15–41)
Albumin: 3.6 g/dL (ref 3.5–5.0)
Alkaline Phosphatase: 63 U/L (ref 38–126)
Anion gap: 9 (ref 5–15)
BUN: 14 mg/dL (ref 8–23)
CO2: 27 mmol/L (ref 22–32)
Calcium: 9.1 mg/dL (ref 8.9–10.3)
Chloride: 102 mmol/L (ref 98–111)
Creatinine, Ser: 1.43 mg/dL — ABNORMAL HIGH (ref 0.61–1.24)
GFR calc Af Amer: 60 mL/min — ABNORMAL LOW (ref >60)
GFR calc non Af Amer: 51 mL/min — ABNORMAL LOW (ref >60)
Glucose, Bld: 155 mg/dL — ABNORMAL HIGH (ref 70–99)
Potassium: 4.5 mmol/L (ref 3.5–5.1)
Sodium: 138 mmol/L (ref 135–145)
Total Bilirubin: 0.7 mg/dL (ref 0.3–1.2)
Total Protein: 6.3 g/dL — ABNORMAL LOW (ref 6.5–8.1)

## 2019-03-23 LAB — URIC ACID: Uric Acid, Serum: 5.6 mg/dL (ref 3.7–8.6)

## 2019-03-23 LAB — POCT GLYCOSYLATED HEMOGLOBIN (HGB A1C): Hemoglobin A1C: 6.7 % — AB (ref 4.0–5.6)

## 2019-03-23 LAB — TSH: TSH: 3.974 u[IU]/mL (ref 0.350–4.500)

## 2019-03-23 LAB — MAGNESIUM: Magnesium: 1.8 mg/dL (ref 1.7–2.4)

## 2019-03-23 LAB — TROPONIN I (HIGH SENSITIVITY)
Troponin I (High Sensitivity): 19 ng/L — ABNORMAL HIGH (ref <18)
Troponin I (High Sensitivity): 21 ng/L — ABNORMAL HIGH (ref <18)

## 2019-03-23 MED ORDER — INSULIN ASPART 100 UNIT/ML ~~LOC~~ SOLN
0.0000 [IU] | Freq: Three times a day (TID) | SUBCUTANEOUS | Status: DC
  Administered 2019-03-24: 2 [IU] via SUBCUTANEOUS

## 2019-03-23 MED ORDER — ACETAMINOPHEN 650 MG RE SUPP: 650.0000 mg | Freq: Four times a day (QID) | RECTAL | Status: DC | PRN

## 2019-03-23 MED ORDER — TORSEMIDE 20 MG PO TABS
20.0000 mg | ORAL_TABLET | ORAL | Status: DC
  Administered 2019-03-25: 20 mg via ORAL
  Filled 2019-03-23: qty 1

## 2019-03-23 MED ORDER — OXYCODONE HCL 5 MG PO TABS
5.0000 mg | ORAL_TABLET | Freq: Four times a day (QID) | ORAL | Status: DC | PRN
  Administered 2019-03-24: 08:00:00 5 mg via ORAL
  Filled 2019-03-23: qty 1

## 2019-03-23 MED ORDER — NITROGLYCERIN 0.4 MG SL SUBL: 0.4000 mg | SUBLINGUAL_TABLET | SUBLINGUAL | Status: DC | PRN

## 2019-03-23 MED ORDER — METOPROLOL TARTRATE 12.5 MG HALF TABLET
12.5000 mg | ORAL_TABLET | Freq: Two times a day (BID) | ORAL | Status: DC
  Administered 2019-03-23 – 2019-03-25 (×4): 12.5 mg via ORAL
  Filled 2019-03-23 (×4): qty 1

## 2019-03-23 MED ORDER — SENNOSIDES-DOCUSATE SODIUM 8.6-50 MG PO TABS: 1.0000 | ORAL_TABLET | Freq: Every evening | ORAL | Status: DC | PRN

## 2019-03-23 MED ORDER — OXYCODONE-ACETAMINOPHEN 10-325 MG PO TABS: 1.0000 | ORAL_TABLET | Freq: Four times a day (QID) | ORAL | 0 refills | Status: DC | PRN

## 2019-03-23 MED ORDER — ALBUTEROL SULFATE (2.5 MG/3ML) 0.083% IN NEBU: 2.5000 mg | INHALATION_SOLUTION | Freq: Four times a day (QID) | RESPIRATORY_TRACT | Status: DC | PRN

## 2019-03-23 MED ORDER — APIXABAN 5 MG PO TABS
5.0000 mg | ORAL_TABLET | Freq: Two times a day (BID) | ORAL | Status: DC
  Administered 2019-03-23 – 2019-03-25 (×4): 5 mg via ORAL
  Filled 2019-03-23 (×4): qty 1

## 2019-03-23 MED ORDER — ACETAMINOPHEN 325 MG PO TABS: 650.0000 mg | ORAL_TABLET | Freq: Four times a day (QID) | ORAL | Status: DC | PRN

## 2019-03-23 MED ORDER — PANTOPRAZOLE SODIUM 40 MG PO TBEC
40.0000 mg | DELAYED_RELEASE_TABLET | Freq: Every day | ORAL | Status: DC
  Administered 2019-03-24 – 2019-03-25 (×2): 40 mg via ORAL
  Filled 2019-03-23 (×2): qty 1

## 2019-03-23 MED ORDER — ATORVASTATIN CALCIUM 10 MG PO TABS
20.0000 mg | ORAL_TABLET | Freq: Every day | ORAL | Status: DC
  Administered 2019-03-24 – 2019-03-25 (×2): 20 mg via ORAL
  Filled 2019-03-23 (×2): qty 2

## 2019-03-23 MED ORDER — OXYCODONE-ACETAMINOPHEN 10-325 MG PO TABS: 1.0000 | ORAL_TABLET | Freq: Four times a day (QID) | ORAL | Status: DC | PRN

## 2019-03-23 MED ORDER — ALLOPURINOL 300 MG PO TABS
300.0000 mg | ORAL_TABLET | Freq: Every day | ORAL | Status: DC
  Administered 2019-03-24 – 2019-03-25 (×2): 300 mg via ORAL
  Filled 2019-03-23 (×2): qty 1

## 2019-03-23 MED ORDER — ENOXAPARIN SODIUM 40 MG/0.4ML ~~LOC~~ SOLN
40.0000 mg | SUBCUTANEOUS | Status: DC
  Administered 2019-03-23: 15:00:00 40 mg via SUBCUTANEOUS
  Filled 2019-03-23: qty 0.4

## 2019-03-23 MED ORDER — FLECAINIDE ACETATE 100 MG PO TABS
100.0000 mg | ORAL_TABLET | Freq: Two times a day (BID) | ORAL | Status: DC
  Administered 2019-03-23 – 2019-03-25 (×4): 100 mg via ORAL
  Filled 2019-03-23 (×5): qty 1

## 2019-03-23 MED ORDER — OXYCODONE-ACETAMINOPHEN 5-325 MG PO TABS
1.0000 | ORAL_TABLET | Freq: Four times a day (QID) | ORAL | Status: DC | PRN
  Administered 2019-03-24 – 2019-03-25 (×3): 1 via ORAL
  Filled 2019-03-23 (×3): qty 1

## 2019-03-23 NOTE — Progress Notes (Deleted)
Date: 03/23/2019               Patient Name:  Juan Calderon MRN: [id number]  DOB: 07/22/1956 Age / Sex: 63 y.o., male   PCP: Axel Filler, MD         Medical Service: Internal Medicine Teaching Service         Attending Physician: Dr. Evette Doffing, Mallie Mussel, *    First Contact: Dr. Court Joy Pager: [phone]  Second Contact: Dr. Trilby Drummer Pager: [phone]       After Hours (After 5p/  First Contact Pager: [phone]  weekends / holidays): Second Contact Pager: [phone]   Chief Complaint: ***  History of Present Illness:  Juan Calderon is a 63 y.o male with CAD, diabetes mellitus type 2, OSA, paroxysmal atrial fibrillation, essential hypertension who presented ***  More stable angina  Cp with minimal ambulation  Sees cardioogist dr [name] 1.5 yrs ago-afib hfpef, last myoview 2016 low risk, sinus bradycardia flecanide   Got ekg- new mobitz  v1 with 4 dropped beats  St changes in v3 v4 60mm st elevation  Not stemi as looks good, possibly a resolving?  No cp with rest-gets if walks 157ft  Tele monitoring, stop metop, workup to see if reason for new mobitz, with st changes worried about ischemia  Cardioversion in 2016 and flecanide to maintain hypomag that uses mag supplements  Hx of long qt Troponin   Patient was bradycardic to***current being prescribed metoprolol 25 mg twice daily.  Presented with lightheadedness on minimal ambulation. EKG done in medicine clinic showed a new Mobitz type II heart block.  Meds: *** No outpatient medications have been marked as taking for the 03/23/19 encounter (Office Visit) with Axel Filler, MD.     Allergies: Allergies as of 03/23/2019 - Review Complete 12/03/2018  Allergen Reaction Noted  . Ramipril Swelling 09/18/2011  . Tape  12/03/2018  . Testosterone Rash 12/04/2011   Past Medical History:  Diagnosis Date  . Alcohol abuse    . Bradycardia   . C6 radiculopathy 01/24/2016   Right upper extremity, mild to  moderate electrically by EMG on 01/24/2016  . Cataract    Left eye  . Chronic diastolic heart failure ([place])     with mild left ventricular hypertrophy on Echo 02/2010  . Chronic obstructive pulmonary disease ([place])    . Chronic osteomyelitis of femur ([place]) 04/06/2016  . Chronic osteomyelitis of left femur ([place]) 11/22/2017   Left femur s/p prior trauma  . Chronic osteomyelitis of left femur ([place]) 11/22/2017   Brodie's abscess: left femur s/p prior trauma.  Underwent partial excision and curettage of left femoral osteomyelitis at [facility] 12/30/2017 with grossly purulent material encountered within the medullary canal of the left distal femur.  Cultures grew MSSA.  Post-operatively received 6 weeks of IV antibiotics through 02/10/2018.  CRP elevated at 60.3 at end of IV antibiotic course so continued on Keflex  . Chronic pain syndrome     Left arm and leg s/p traumatic injury   . Chronic renal insufficiency    . Coronary artery disease     25% LAD stenosis on cath 2007.  Stable angina.  . Diverticulosis    . Diverticulosis 11/12/2013  . Essential hypertension    . Frequent PVCs   . Gastroesophageal reflux disease    . Gout    . Hyperlipidemia LDL goal < 100    . Internal hemorrhoids without complication 44/11/4740  . Long-term current use  of opiate analgesic 09/07/2016  . Mild carpal tunnel syndrome of right wrist 01/24/2016   Mild degree electrically per EMG 01/24/2016   . Mild carpal tunnel syndrome of right wrist 01/24/2016   Mild degree electrically per EMG 01/24/2016   . Morbid obesity with BMI of 40.0-44.9, adult ([place])    . Normocytic anemia    . NSVT (nonsustained ventricular tachycardia) ([place])   . Obstructive sleep apnea     Moderate, AHI 29.8 per hour with moderately loud snoring and oxygen desaturation to a nadir of 79%. CPAP titration resulted in a prescription for 17 CWP.    [name] Open-angle glaucoma    . Osteoarthritis cervical spine    . Osteoarthritis of left knee 06/19/2013    Tricompartmental disease.  Treated with double hinged upright knee brace, steroid/xylocaine knee injections, and NSAIDs   . Osteoporosis 05/14/2017   s/p fracture of the right humerus from a fall at ground hight  . Persistent atrial fibrillation   . Right rotator cuff tear     Large full-thickness tear of the supraspinatus with mild retraction but no atrophy   . Right rotator cuff tear 04/25/2013   Large full-thickness tear of the supraspinatus with mild retraction but no atrophy    . Secondary male hypogonadism 02/07/2017   Likely secondary to chronic opioid use  . Secondary male hypogonadism 02/07/2017   Likely secondary to chronic opioid use  . Subclinical hypothyroidism    . Tubular adenoma of colon 11/22/2017   Specifics unknown.  Repeat colonoscopy 08/12/2018 with six 3-6 mm tubular adenomas removed endoscopically.  . 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 ([place])    . Vasomotor rhinitis 04/25/2013    Family History: ***  Social History: ***  Review of Systems: A complete ROS was negative except as per HPI. ***  Physical Exam: Blood pressure 135/80, pulse 85, temperature 98 F (36.7 C), temperature source Oral, height 5' 9.5" (1.765 m), weight (!) 319 lb 6.4 oz (144.9 kg), SpO2 95 %. ***  EKG: personally reviewed my interpretation is***  CXR: personally reviewed my interpretation is***  Assessment & Plan by Problem: Active Problems:   * No active hospital problems. *   Dispo: Admit patient to {STATUS:3044014::"Observation with expected length of stay less than 2 midnights.","Inpatient with expected length of stay greater than 2 midnights."}  Signed: Lars Mage, MD 03/23/2019, 10:06 AM  Pager: [phone]

## 2019-03-23 NOTE — Assessment & Plan Note (Addendum)
Patient has a history of stable angina.  Mild coronary artery disease on left heart catheterization in 2007.  Low risk Myoview stress test in 2017.  Symptoms of progressive chest pain with minimal exertion seem high risk for accelerating angina.  He has no chest pain at rest currently.  EKG shows changes, there are 1 mm ST elevations at V3 and V4, worsened T wave inversions in V3 through V5.  Given that he has no chest pain, completely stable vitals, I do not think this is an ST elevated MI.  It is possible he had an ischemic event a few days ago and these EKG changes are resolving.  Plan is for admission to the internal medicine teaching service.  Monitor on telemetry.  High-sensitivity troponin pending.  Repeat echo.  Would consult with cardiology for an ischemic evaluation.  Start aspirin 81 mg, in addition to the Eliquis he uses for A. fib.  Increase atorvastatin dose to 80 mg daily.  I am checking lipid panel.   Sublingual nitroglycerin as needed for chest pain.

## 2019-03-23 NOTE — Progress Notes (Signed)
65 

## 2019-03-23 NOTE — Assessment & Plan Note (Addendum)
EKG in office today showed numerous non-conducted p waves. I initially thought this was a heart block. But on further review and after consultation with cardiology, it is most consistent with atrial flutter. This is symptomatic atrial flutter given his light headedness and chest pressure with minimal exertion. Patient was directly admitted to the IM teaching service, greatly appreciate their management. Also greatly appreciate cardiology consultation given his history of significant heart disease and this new arrhythmia.

## 2019-03-23 NOTE — Progress Notes (Addendum)
Assessment and Plan:  See Encounters tab for problem-based medical decision making.   __________________________________________________________  HPI:   63 year old person living with chronic heart failure with preserved ejection fraction here for follow-up of diabetes.  This was a regular scheduled visit for management of his chronic medical issues.  He was previously a patient of Dr. Eppie Gibson, now transferred to my care.  He is a retired Chief Executive Officer, worked for over 20 years, currently on disability because of chronic arthritis related pain.  Lives in [place] with a friend, not much family in the area.  I reviewed his previous notes from Dr. Eppie Gibson, from Dr. [name] with cardiology, and from his hospitalization last year for the Brodie abscess in his leg.  He has a very active problem right now.  On review of systems he admitted to having increasing chest pain and lightheadedness with ambulation.  He says that he has been getting some amount of chest pain with exertion for many years.  This is previously been managed as stable angina.  However the last few days he says it has been much worse.  Currently he can walk up only about 3 or 4 steps before feeling symptoms.  He was able to walk to the [facility] from the front of the hospital, but he had walked very slowly.  It did cause chest pressure.  As well as lightheadedness.  He wonders if the lightheadedness is due to A. fib coming back.  However he has not felt any palpitations or fast heart rate.  He says this feeling does feel different than his previous A. fib symptoms.   He has no fevers or chills.  No cough.  No exposures to COVID-19.  __________________________________________________________  Problem List: Patient Active Problem List   Diagnosis Date Noted  . Accelerating angina ([place]) 03/23/2019    Priority: High  . Chronic use of opiate drug for therapeutic purpose 08/23/2017    Priority: High  . Paroxysmal atrial fibrillation (HCC) 10/25/2015     Priority: High  . 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 ([place]) 04/25/2013    Priority: High  . Severe obesity with body mass index (BMI) of 35.0 to 39.9 with comorbidity ([place]) 04/25/2013    Priority: High  . Alcohol use disorder 02/28/2015    Priority: Medium  . Obstructive sleep apnea 06/01/2013    Priority: Medium  . Osteoarthritis cervical spine 04/25/2013    Priority: Medium  . Hyperlipidemia 04/25/2013    Priority: Medium  . Coronary artery disease involving native coronary artery with angina pectoris ([place]) 04/25/2013    Priority: Medium  . Chronic asthmatic bronchitis ([place]) 04/25/2013    Priority: Medium  . Chronic diastolic heart failure ([place]) 02/04/2012    Priority: Medium  . Essential hypertension 09/20/2011    Priority: Medium  . Tubular adenoma of colon 11/22/2017    Priority: Low  . C6 radiculopathy 01/24/2016    Priority: Low  . Constipation due to opioid therapy 12/24/2014    Priority: Low  . Post-traumatic osteoarthritis of left knee 06/19/2013    Priority: Low  . Gastroesophageal reflux disease without esophagitis 04/25/2013    Priority: Low  . Open-angle glaucoma 04/25/2013    Priority: Low  . Idiopathic chronic gout without tophus 04/25/2013    Priority: Low  . Healthcare maintenance 01/15/2013    Priority: Low  . Mobitz type 2 second degree heart block 03/23/2019    Medications: Reconciled today in Epic __________________________________________________________  Physical Exam:  Vital Signs:  Vitals:   03/23/19 0913  BP: 135/80  Pulse: 85  Temp: 98 F (36.7 C)  TempSrc: Oral  SpO2: 95%  Weight: (!) 319 lb 6.4 oz (144.9 kg)  Height: 5' 9.5" (1.765 m)    Gen: Well appearing, NAD Neck: No cervical LAD, No thyromegaly or nodules CV: Regular rate, skipped beats are frequent, 2 out of 6 early systolic murmur at the left upper sternal border, 1+ pitting edema bilateral lower extremities. Pulm: Normal effort, CTA  throughout, no wheezing Abd: Soft, NT, ND   Personally reviewed the EKG which shows numerous nonconducting P waves best seen on lead V1. Atrial rate consistent around 150 bpm. Most consistent with atrial flutter with variable conduction. He has a prolonged PR interval at 250 ms and a prolonged QRS at 114 ms, consistent with an incomplete left bundle branch block.  He has ST changes, there is a 1 mm ST elevation in V3 and V4.  There are T wave inversions in V3 through V6.     I spent 40 minutes with the patient with over 50% of the encounter time dedicated to counseling on the above problems.

## 2019-03-23 NOTE — Consult Note (Addendum)
Cardiology Consultation:   Patient ID: [id number] BAIS; [id number]; 1956/08/02   Admit date: 03/23/2019 Date of Consult: 03/23/2019  Primary Care Provider: Axel Filler, MD Primary Cardiologist: Juan Chandler, MD Primary Electrophysiologist:  Juan Axe, MD   Patient Profile:   Juan Calderon is a 63 y.o. male with a PMH of non-obstructive CAD (25% LAD stenosis on LHC in 2007), chronic diastolic CHF, paroxysmal atrial fibrillation s/p DCCV in 2016 on flecainide, HTN, DM type 2, COPD, OSA, who is being seen today for the evaluation of bradycardia at the request of Dr. Lynnae Calderon.  History of Present Illness:   Juan Calderon presented to his PCP office today for a routine visit. He reported feeling increasing DOE over the past month or two which has been progressively worsening to the point of onset with walking at <180f. An EKG was obtained at that visit and reported to be c/f 2nd degree AV block type 2, as well as ST-T wave abnormalities in anterolateral leads. [facility] these findings[facility]   He was last evaluated by cardiology via a telemedicine visit with Juan Calderon, at which time he had baseline dyspnea, weight gain, and LE edema. He was instructed to take an extra torsemide for several days. He was last seen in person in cardiology clinic  by Juan Calderon at which time he had weight gain, poor dietary habits, and LE edema but no palpitations and was continued on flecainide, metoprolol, and apixaban for management of his atrial fibrillation. Last ischemic evaluation was a NST in 2016 which was normal. He had a cardiac catheterization in 2007 which revealed 25% stenosis of the LAD. Last echo in 2016 with EF 55-60%, mild LVH, and peak PA pressure 353mg.  At the time of this evaluation he reports feeling okay. No complaints of SOB at rest. He denies chest pain with exertion or at rest. He has chronic orthopnea and LE edema  which are unchanged. He does report a weight gain of 30lbs in the past 6 months which he attributes to eating more. He reported having DOE with his diagnosis of atrial fibrillation in 2016. No complaints of dizziness, lightheadedness, or syncope.   Hospital course: , Trop 19, TSH wnl. EKG in the media tab reviewed revealing atrial flutter with variable AV block with rate 71 bpm Patient was admitted to medicine. Cardiology asked to evaluate for AV block.   Past Medical History:  Diagnosis Date  . Alcohol abuse    . Bradycardia   . C6 radiculopathy 01/24/2016   Right upper extremity, mild to moderate electrically by EMG on 01/24/2016  . Cataract    Left eye  . Chronic diastolic heart failure ([place]CA[place]    with mild left ventricular hypertrophy on Echo 02/2010  . Chronic obstructive pulmonary disease (HC[place]   . Chronic osteomyelitis of femur (HC[place]8/12/2015  . Chronic osteomyelitis of left femur ([place]3/22/2019   Left femur s/p prior trauma  . Chronic osteomyelitis of left femur (HCCope3/22/2019   Brodie's abscess: left femur s/p prior trauma.  [facility] with grossly purulent material encountered within the medullary canal of the left distal femur.  Cultures grew MSSA.  Post-operatively received 6 weeks of IV antibiotics through 6/Calderon.  CRP elevated at 60.3 at end of IV antibiotic course so continued on Keflex  . Chronic pain syndrome     Left  arm and leg s/p traumatic injury   . Chronic renal insufficiency    . Coronary artery disease     25% LAD stenosis on cath 2007.  Stable angina.  . Diverticulosis    . Diverticulosis 11/12/2013  . Essential hypertension    . Frequent PVCs   . Gastroesophageal reflux disease    . Gout    . Hyperlipidemia LDL goal < 100    . Internal hemorrhoids without complication 29/52/8413  . Long-term current use of opiate analgesic 09/07/2016  . Mild carpal tunnel syndrome of right wrist  01/24/2016   Mild degree electrically per EMG 01/24/2016   . Mild carpal tunnel syndrome of right wrist 01/24/2016   Mild degree electrically per EMG 01/24/2016   . Morbid obesity with BMI of 40.0-44.9, adult ([place])    . Normocytic anemia    . NSVT (nonsustained ventricular tachycardia) ([place])   . Obstructive sleep apnea     Moderate, AHI 29.8 per hour with moderately loud snoring and oxygen desaturation to a nadir of 79%. CPAP titration resulted in a prescription for 17 CWP.    [name] Open-angle glaucoma    . Osteoarthritis cervical spine    . Osteoarthritis of left knee 06/19/2013   Tricompartmental disease.  Treated with double hinged upright knee brace, steroid/xylocaine knee injections, and NSAIDs   . Osteoporosis 05/14/2017   s/p fracture of the right humerus from a fall at ground hight  . Persistent atrial fibrillation   . Right rotator cuff tear     Large full-thickness tear of the supraspinatus with mild retraction but no atrophy   . Right rotator cuff tear 04/25/2013   Large full-thickness tear of the supraspinatus with mild retraction but no atrophy    . Secondary male hypogonadism 02/07/2017   Likely secondary to chronic opioid use  . Secondary male hypogonadism 02/07/2017   Likely secondary to chronic opioid use  . Subclinical hypothyroidism    . Tubular adenoma of colon 11/22/2017   Specifics unknown.  Repeat colonoscopy 12/Calderon with six 3-6 mm tubular adenomas removed endoscopically.  . 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 ([place])    . Vasomotor rhinitis 04/25/2013    Past Surgical History:  Procedure Laterality Date  . CARDIOVERSION N/A 12/30/2014   Procedure: CARDIOVERSION;  Surgeon: [name], MD;  Location: [facility] ENDOSCOPY;  Service: Cardiovascular;  Laterality: N/A;  . FRACTURE SURGERY Left 1980's   Elbow  . Left arm surgery    . Left leg surgery    . SHOULDER SURGERY     Right     Home Medications:  Prior to Admission medications    Medication Sig Start Date End Date Taking? Authorizing Provider  albuterol (PROVENTIL HFA;VENTOLIN HFA) 108 (90 Base) MCG/ACT inhaler Inhale 1-2 puffs into the lungs every 6 (six) hours as needed for shortness of breath. 07/18/18   Oval Linsey, MD  allopurinol (ZYLOPRIM) 300 MG tablet Take 1 tablet (300 mg total) by mouth daily. Please note the change in the number of tablets to take daily. 03/18/18   Oval Linsey, MD  atorvastatin (LIPITOR) 20 MG tablet Take 1 tablet (20 mg total) by mouth daily. 06/03/18   Oval Linsey, MD  Blood Glucose Monitoring Suppl (ONETOUCH VERIO) w/Device KIT 1 each by Does not apply route daily. 11/04/15   Loleta Chance, MD  ELIQUIS 5 MG TABS tablet TAKE 1 TABLET BY MOUTH TWICE A DAY 11/24/18   Deboraha Sprang, MD  esomeprazole ([place]) 40 MG  capsule Take 1 capsule (40 mg total) by mouth daily. 10/24/18   Oval Linsey, MD  flecainide (TAMBOCOR) 100 MG tablet Take 1 tablet (100 mg total) by mouth every 12 (twelve) hours. 10/27/18   Oval Linsey, MD  glucose blood [facility] VERIO) test strip Use as instructed 05/24/16   Oval Linsey, MD  latanoprost (XALATAN) 0.005 % ophthalmic solution Place 1 drop into both eyes at bedtime. 10/11/16   Oval Linsey, MD  Loteprednol Etabonate (LOTEMAX) 0.5 % GEL Apply 0.5 % to eye daily. 4 times/day in left eye; 2 times/day in right eye    [provider]  magnesium oxide (MAG-OX) 400 MG tablet Take 2 tablets by mouth 2 (two) times daily. 02/24/18   [provider]  metFORMIN (GLUCOPHAGE) 1000 MG tablet Take 1 tablet (1,000 mg total) by mouth 2 (two) times daily with a meal. 10/27/18   Oval Linsey, MD  metoprolol tartrate (LOPRESSOR) 25 MG tablet TAKE 1 TABLET BY MOUTH TWICE A DAY 05/07/18   Oval Linsey, MD  nitroGLYCERIN (NITROSTAT) 0.4 MG SL tablet Place 1 tablet (0.4 mg total) under the tongue every 5 (five) minutes as needed for chest pain. 07/18/18   Oval Linsey, MD  [facility] DELICA LANCETS 27C MISC  Use 1 strip daily 05/24/16   Oval Linsey, MD  oxyCODONE-acetaminophen (PERCOCET) 10-325 MG tablet Take 1 tablet by mouth every 6 (six) hours as needed for pain. 03/23/19 03/22/20  Juan Filler, MD  Sorbitol 70 % SOLN Take 15-60 mLs by mouth daily as needed. 07/18/18   Oval Linsey, MD  terazosin (HYTRIN) 5 MG capsule Take 1 capsule (5 mg total) by mouth at bedtime. 04/28/18   Oval Linsey, MD  torsemide (DEMADEX) 20 MG tablet TAKE 2 TABLETS BY MOUTH 3 TIMES PER WEEK 02/19/19   Juan Filler, MD    Inpatient Medications: Scheduled Meds: . enoxaparin (LOVENOX) injection  40 mg Subcutaneous Q24H   Continuous Infusions:  PRN Meds: acetaminophen **OR** acetaminophen, senna-docusate  Allergies:    Allergies  Allergen Reactions  . Ramipril Swelling  . Tape   . Testosterone Rash    Social History:   Social History   Socioeconomic History  . Marital status: Widowed    Spouse name: Not on file  . Number of children: Not on file  . Years of education: Not on file  . Highest education level: Not on file  Occupational History  . Not on file  Social Needs  . Financial resource strain: Not on file  . Food insecurity    Worry: Not on file    Inability: Not on file  . Transportation needs    Medical: Not on file    Non-medical: Not on file  Tobacco Use  . Smoking status: Never Smoker  . Smokeless tobacco: Never Used  Substance and Sexual Activity  . Alcohol use: Yes    Alcohol/week: 14.0 standard drinks    Types: 14 Cans of beer per week  . Drug use: No  . Sexual activity: Yes  Lifestyle  . Physical activity    Days per week: Not on file    Minutes per session: Not on file  . Stress: Not on file  Relationships  . Social Herbalist on phone: Not on file    Gets together: Not on file    Attends religious service: Not on file    Active member of club or organization: Not on file    Attends meetings of clubs or  organizations: Not on file     Relationship status: Not on file  . Intimate partner violence    Fear of current or ex partner: Not on file    Emotionally abused: Not on file    Physically abused: Not on file    Forced sexual activity: Not on file  Other Topics Concern  . Not on file  Social History Narrative  . Not on file    Family History:    Family History  Problem Relation Age of Onset  . Heart failure Mother   . Alzheimer's disease Father   . Heart failure Brother   . Osteoarthritis Brother   . Prostate cancer Brother   . Early death Brother        Manufacturing systems engineer  . Hypertension Sister   . Hypertension Brother   . Heart attack Neg Hx   . Stroke Neg Hx   . Colon cancer Neg Hx   . Esophageal cancer Neg Hx   . Pancreatic cancer Neg Hx   . Stomach cancer Neg Hx   . Liver disease Neg Hx      ROS:  Please see the history of present illness.   All other ROS reviewed and negative.     Physical Exam/Data:   Vitals:   03/23/19 1055  BP: 123/81  Pulse: 65  Resp: 18  Temp: 97.8 F (36.6 C)  TempSrc: Oral  SpO2: 97%  Weight: (!) 141.1 kg  Height: 6' (1.829 m)   No intake or output data in the 24 hours ending 03/23/19 1331 Filed Weights   03/23/19 1055  Weight: (!) 141.1 kg   Body mass index is 42.18 kg/m.  General:  Well nourished, well developed, in no acute distress HEENT: sclera anicteric  Neck: no JVD Vascular: No carotid bruits; distal pulses 2+ bilaterally Cardiac:  normal S1, S2; IRIR; no murmurs, rubs, or gallops Lungs:  clear to auscultation bilaterally, no wheezing, rhonchi or rales  Abd: NABS, soft, obese, nontender, no hepatomegaly Ext: no pitting edema; compression stockings in place Musculoskeletal:  No deformities, BUE and BLE strength normal and equal Skin: warm and dry  Neuro:  CNs 2-12 intact, no focal abnormalities noted Psych:  Normal affect   EKG:  The EKG was personally reviewed and demonstrates:   in the media tab reviewed revealing atrial flutter with variable  AV block with rate 71, with non-specific ST-T wave abnormalities in anterolateral leads and non-specific IVCD - no significant change on hospital EKG Telemetry:  Telemetry was personally reviewed and demonstrates:  Atrial flutter with variable AV block with CVR  Relevant CV Studies: Echocardiogram 2016: Study Conclusions  - Left ventricle: LVEF is approximately 55 to 60% The cavity size   was normal. Wall thickness was increased in a pattern of mild   LVH.   Laboratory Data:  Chemistry Recent Labs  Lab 03/23/19 1030  NA 138  K 4.5  CL 102  CO2 27  GLUCOSE 155*  BUN 14  CREATININE 1.43*  CALCIUM 9.1  GFRNONAA 51*  GFRAA 60*  ANIONGAP 9    Recent Labs  Lab 03/23/19 1030  PROT 6.3*  ALBUMIN 3.6  AST 23  ALT 30  ALKPHOS 63  BILITOT 0.7   Hematology Recent Labs  Lab 03/23/19 1030  WBC 6.8  RBC 4.25  HGB 13.5  HCT 41.6  MCV 97.9  MCH 31.8  MCHC 32.5  RDW 13.3  PLT 164   Cardiac EnzymesNo results for input(s): TROPONINI in  the last 168 hours. No results for input(s): TROPIPOC in the last 168 hours.  BNPNo results for input(s): BNP, PROBNP in the last 168 hours.  DDimer No results for input(s): DDIMER in the last 168 hours.  Radiology/Studies:  No results found.  Assessment and Plan:   1. New onset atrial flutter: patient has known history of atrial fibrillation s/p DCCV in 2016. Maintained sinus rhythm until now. He has had progressive DOE for the past 1-2 months, similar to atrial fibrillation diagnosis. TSH wnl. EKG with atrial flutter with variable AV block with CVR. He denies any missed doses of eliquis and has not had any bleeding complications.  - Continue eliquis for stroke ppx given CHA2DS2-VASc Score of 4 (CHF, HTN, DM type 2, and Vascular)  - Continue flecainide for now, though would likely benefit from an alternative antiarrhythmic medication given breakthrough atrial flutter - Continue metoprolol tartrate 37m BID for rate control - REviewed  with EP   WIll see patient in AM   Discuss possible ablation  - Will check an echocardiogram  2. DOE: Likely multifactorial due to atrial flutter in setting of chronic diastolic CHF, suspected OHS, unmanaged OSA, and morbid obesity. PFTs in 2014 showed severe obstructive/restrictive airway disease. He also has evidence of pulmHTN on previous echo in 2016 - Will check an echocardiogram - Continue torsemide TIW - patient does not appear volume overloaded on exam.  - Check daily weights and strict I&Os  3. Non-obstructive CAD: No complaints of chest pain. EKG with non-specific ST-T wave abnormalities. Trop 19, no trend yet  - Will check an echo to assess LV function and wall motion abnormaliti - Not convinced of active angina  Symptoms probably from atrial flutter with loss of atrial kick - Not on ASA given need for anticoagulation - Continue statin and metoprolol   4. HTN: BP stable - Continue metoprolol   5. HLD: LDL well controlled at 23 - Continue statin  6. OSA: not on CPAP. He was fitted for an oral appliance but was unable to afford it.   Remainder of care per primary team.   For questions or updates, please contact CLowellPlease consult [email or web address] for contact info under Cardiology/STEMI.   Signed, KAbigail Butts PA-C  03/23/2019 1:31 PM [phone] Pt seen and examined    I have amended note above to reflect my findings  Pt is a 63yo with hx of HTN, nonobstructive CAD, diasotlic CHF, atrial fibrillation   Presents for eval of possible heart block   He has complained of fatigue for the past few months    ON exam, pt is comfortable laying flat Neck is full LUngs are CTA Cardiac exam:  Distant heart sounds    Irreg rate and rhythm   No S3  No signif murmurs Abd Benign   Obese Ext show Tr to 1+ edema   Review of EKG shows atrial flutter with variable ventricular conduction     THis may explain symtopms    He had been in SR previously  EP is aware of  patient    Will be by to see in AM to discuss option for ablation of atirla flutter     Keep on same medicines   Keep NPO after midnight    PDorris CarnesMD

## 2019-03-23 NOTE — H&P (Signed)
Date: 03/23/2019               Patient Name:  Juan Calderon MRN: [id number]  DOB: 05-01-1956 Age / Sex: 63 y.o., male   PCP: Axel Filler, MD         Medical Service: Internal Medicine Teaching Service         Attending Physician: Dr. Bartholomew Crews, MD    First Contact: Dr. Madilyn Fireman Pager: [phone]  Second Contact: Dr. Frederico Hamman Pager: [phone]       After Hours (After 5p/  First Contact Pager: [phone]  weekends / holidays): Second Contact Pager: [phone]   Chief Complaint:   History of Present Illness:  Mr. Garringer is a 63 year old male with CAD (25% LAD stenosis on LHC 2007), HFpEF, diabetes mellitus type 2, OSA, paroxysmal atrial fibrillation s/p dccv, essential hypertension who was admitted from [facility] clinic due to new onset Mobitz type II heart block.  Patient was seen at [facility] today for regularly scheduled continuity visit with new doctor.  The patient mentioned that he has " not been feeling right" for the past couple of months.  He " felt that his heart was going to jump out of rhythm and he was going to lose his breath."  He states that he has shortness of breath and chest pain that lasts a few minutes with exertion less than 120ft.  He denies any dizziness, lightheadedness, palpitations, feelings of a skipped beat, loss of consciousness, nausea/vomiting.  At [facility], the patient was afebrile, normotensive, normal pulse, respirating well on room air. EKG was read by pcp as second degree type II heart block with st changes in v3 and v4 and t wave inversions in v3 through v6. This ekg read prompted admission.   Labs: A1c 6.7 CBC- WBC 6.8, Hb 13.5, HCT 41, 164 CMP- a 138, K4.5, BUN 14, 3T 23, ALT 30, T bili 0.7 Troponin 19  TSH 3.974 Lipid panel cholesterol 95, triglycerides, HDL 48, LDL 23 Mg 1.8 Uric acid 5.6  Meds:  Current Meds  Medication Sig  . albuterol (PROVENTIL HFA;VENTOLIN HFA) 108 (90 Base) MCG/ACT inhaler Inhale 1-2 puffs into the lungs every 6 (six)  hours as needed for shortness of breath.  . allopurinol (ZYLOPRIM) 300 MG tablet Take 1 tablet (300 mg total) by mouth daily. Please note the change in the number of tablets to take daily.  [name] atorvastatin (LIPITOR) 20 MG tablet Take 1 tablet (20 mg total) by mouth daily.  [name] Dextromethorphan-guaiFENesin (MUCINEX DM PO) Take 10 mLs by mouth every 4 (four) hours as needed (cough).  [name] ELIQUIS 5 MG TABS tablet TAKE 1 TABLET BY MOUTH TWICE A DAY (Patient taking differently: Take 5 mg by mouth 2 (two) times daily. )  . esomeprazole (NEXIUM) 40 MG capsule Take 1 capsule (40 mg total) by mouth daily.  . flecainide (TAMBOCOR) 100 MG tablet Take 1 tablet (100 mg total) by mouth every 12 (twelve) hours.  [name] latanoprost (XALATAN) 0.005 % ophthalmic solution Place 1 drop into both eyes at bedtime.  . magnesium oxide (MAG-OX) 400 MG tablet Take 2 tablets by mouth 2 (two) times daily.  . metFORMIN (GLUCOPHAGE) 1000 MG tablet Take 1 tablet (1,000 mg total) by mouth 2 (two) times daily with a meal.  . metoprolol tartrate (LOPRESSOR) 25 MG tablet TAKE 1 TABLET BY MOUTH TWICE A DAY (Patient taking differently: Take 25 mg by mouth 2 (two) times daily. )  . nitroGLYCERIN (NITROSTAT) 0.4 MG  SL tablet Place 1 tablet (0.4 mg total) under the tongue every 5 (five) minutes as needed for chest pain.  [name] oxyCODONE-acetaminophen (PERCOCET) 10-325 MG tablet Take 1 tablet by mouth every 6 (six) hours as needed for pain.  . Sorbitol 70 % SOLN Take 15-60 mLs by mouth daily as needed. (Patient taking differently: Take 60 mLs by mouth daily as needed (constipation). )  . terazosin (HYTRIN) 5 MG capsule Take 1 capsule (5 mg total) by mouth at bedtime.  . torsemide (DEMADEX) 20 MG tablet TAKE 2 TABLETS BY MOUTH 3 TIMES PER WEEK (Patient taking differently: Take 40 mg by mouth every Monday, Wednesday, and Friday. )   Allergies: Allergies as of 03/23/2019 - Review Complete 03/23/2019  Allergen Reaction Noted  . Ramipril Swelling  09/18/2011  . Tape Other (See Comments) 12/03/2018  . Testosterone Rash 12/04/2011   Past Medical History:  Diagnosis Date  . Alcohol abuse    . Bradycardia   . C6 radiculopathy 01/24/2016   Right upper extremity, mild to moderate electrically by EMG on 01/24/2016  . Cataract    Left eye  . Chronic diastolic heart failure ([place])     with mild left ventricular hypertrophy on Echo 02/2010  . Chronic obstructive pulmonary disease ([place])    . Chronic osteomyelitis of femur ([place]) 04/06/2016  . Chronic osteomyelitis of left femur ([place]) 11/22/2017   Left femur s/p prior trauma  . Chronic osteomyelitis of left femur ([place]) 11/22/2017   Brodie's abscess: left femur s/p prior trauma.  Underwent partial excision and curettage of left femoral osteomyelitis at [facility] 12/30/2017 with grossly purulent material encountered within the medullary canal of the left distal femur.  Cultures grew MSSA.  Post-operatively received 6 weeks of IV antibiotics through 02/10/2018.  CRP elevated at 60.3 at end of IV antibiotic course so continued on Keflex  . Chronic pain syndrome     Left arm and leg s/p traumatic injury   . Chronic renal insufficiency    . Coronary artery disease     25% LAD stenosis on cath 2007.  Stable angina.  . Diverticulosis    . Diverticulosis 11/12/2013  . Essential hypertension    . Frequent PVCs   . Gastroesophageal reflux disease    . Gout    . Hyperlipidemia LDL goal < 100    . Internal hemorrhoids without complication 51/10/5850  . Long-term current use of opiate analgesic 09/07/2016  . Mild carpal tunnel syndrome of right wrist 01/24/2016   Mild degree electrically per EMG 01/24/2016   . Mild carpal tunnel syndrome of right wrist 01/24/2016   Mild degree electrically per EMG 01/24/2016   . Morbid obesity with BMI of 40.0-44.9, adult ([place])    . Normocytic anemia    . NSVT (nonsustained ventricular tachycardia) ([place])   . Obstructive sleep apnea     Moderate, AHI 29.8 per hour with moderately loud  snoring and oxygen desaturation to a nadir of 79%. CPAP titration resulted in a prescription for 17 CWP.    [name] Open-angle glaucoma    . Osteoarthritis cervical spine    . Osteoarthritis of left knee 06/19/2013   Tricompartmental disease.  Treated with double hinged upright knee brace, steroid/xylocaine knee injections, and NSAIDs   . Osteoporosis 05/14/2017   s/p fracture of the right humerus from a fall at ground hight  . Persistent atrial fibrillation   . Right rotator cuff tear     Large full-thickness tear of the supraspinatus with mild retraction but  no atrophy   . Right rotator cuff tear 04/25/2013   Large full-thickness tear of the supraspinatus with mild retraction but no atrophy    . Secondary male hypogonadism 02/07/2017   Likely secondary to chronic opioid use  . Secondary male hypogonadism 02/07/2017   Likely secondary to chronic opioid use  . Subclinical hypothyroidism    . Tubular adenoma of colon 11/22/2017   Specifics unknown.  Repeat colonoscopy 08/12/2018 with six 3-6 mm tubular adenomas removed endoscopically.  . 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 ([place])    . Vasomotor rhinitis 04/25/2013    Family History:   Disease brother died of a heart attack in 43s Diabetes mellitus Brothers Hypertension siblings Cancer Brother  Social History:  Currently on disability Lives with a friend in [place], has 1 son Drinks beer, whiskey every now and then  No drug use  Review of Systems: A complete ROS was negative except as per HPI.   Physical Exam: Blood pressure 119/78, pulse (!) 52, temperature 97.8 F (36.6 C), temperature source Oral, resp. rate 18, height 6' (1.829 m), weight (!) 141.1 kg, SpO2 97 %.  Physical Exam  Constitutional: He appears well-developed and well-nourished. No distress.  HENT:  Head: Normocephalic and atraumatic.  Eyes: Conjunctivae are normal.  Cardiovascular: Normal heart sounds. An irregular rhythm present.  Bradycardia present.  Respiratory: Effort normal and breath sounds normal. No respiratory distress. He has no wheezes.  GI: Soft. Bowel sounds are normal. He exhibits no distension. There is no abdominal tenderness.  Musculoskeletal:        General: No edema.  Neurological: He is alert.  Skin: He is not diaphoretic. No erythema.  Psychiatric: He has a normal mood and affect. His behavior is normal. Judgment and thought content normal.   EKG: personally reviewed my interpretation is irregularly iregular rhythm, t wave inversions in leads st depression in lateral leads     CXR: pending  Assessment & Plan by Problem: Principal Problem:   Atrial flutter ([place])  63 year old male with diastolic heart failure, stable angina presenting with worsening exertional chest pain and dyspnea found to have new atrial flutter.  Atrial Flutter  Hx of Paroxysmal atrial fibrillation s/p DCCV in 2016  Patient is currently taking flecainide.  Trigger for patient's a flutter may be due to medication nonadherence, drinking alcohol (whiskey).  We will get a TTE to look for any structural changes that might be contributing to new aflutter.  He has a normal TSH of 3.9.  Patient's chads vasc score is 4.  -Continue Eliquis -Continue flecainide 100 mg twice daily -Continue metoprolol 12.5 mg twice daily -Appreciate cardiology following -Patient will be scheduled for cardioversion after attending approval and will then place n.p.o. orders -Continue cardiac monitoring  Coronary artery disease And last myocardial perfusion imaging was in 2016 which showed normal stress test.  The patient is being prescribed metoprolol 25 mg twice daily and atorvastatin 20 mg daily. Troponin is not significantly elevated.   -holding metoprolol as patient's HR is low  -continue atorvastatin 20mg  qd  -trending troponin  HFpEF Patient's last echo from 2016 showed an EF of 60%, PA peak pressure 39.  -Repeating echo -Monitor  I's/O, daily weights -Continue torsemide 20 mg 3 times weekly   Diabetes Mellitus type 2 Patient's A1c is 6.7.  He is currently on metformin 1000mg  twice daily  -SSI  COPD -Continue albuterol 1 to 2 puffs every 6 hours as needed  OSA -will place on cpap  in hospital   Gout Continue allopurinol 300 mg daily  C6 Radiculopathy -continue percocet 10-325mg  q6hrs prn   Dispo: Admit patient to Inpatient with expected length of stay greater than 2 midnights.  SignedLars Mage, MD 03/23/2019, 5:51 PM  Pager: [phone]

## 2019-03-24 ENCOUNTER — Encounter (HOSPITAL_COMMUNITY)
Admission: AD | Disposition: A | Discharge status: Home / Self Care | Payer: Self-pay | Source: Ambulatory Visit | Attending: Internal Medicine

## 2019-03-24 ENCOUNTER — Inpatient Hospital Stay (HOSPITAL_COMMUNITY): Payer: Medicare Other

## 2019-03-24 DIAGNOSIS — I5030 Unspecified diastolic (congestive) heart failure: Secondary | ICD-10-CM

## 2019-03-24 DIAGNOSIS — I110 Hypertensive heart disease with heart failure: Secondary | ICD-10-CM

## 2019-03-24 DIAGNOSIS — I483 Typical atrial flutter: Secondary | ICD-10-CM

## 2019-03-24 DIAGNOSIS — Z7901 Long term (current) use of anticoagulants: Secondary | ICD-10-CM

## 2019-03-24 DIAGNOSIS — I441 Atrioventricular block, second degree: Secondary | ICD-10-CM

## 2019-03-24 DIAGNOSIS — Z79899 Other long term (current) drug therapy: Secondary | ICD-10-CM

## 2019-03-24 DIAGNOSIS — I2510 Atherosclerotic heart disease of native coronary artery without angina pectoris: Secondary | ICD-10-CM

## 2019-03-24 DIAGNOSIS — R0609 Other forms of dyspnea: Secondary | ICD-10-CM

## 2019-03-24 HISTORY — PX: A-FLUTTER ABLATION: EP1230

## 2019-03-24 LAB — GLUCOSE, CAPILLARY
Glucose-Capillary: 111 mg/dL — ABNORMAL HIGH (ref 70–99)
Glucose-Capillary: 141 mg/dL — ABNORMAL HIGH (ref 70–99)
Glucose-Capillary: 100 mg/dL — ABNORMAL HIGH (ref 70–99)
Glucose-Capillary: 98 mg/dL (ref 70–99)
Glucose-Capillary: 118 mg/dL — ABNORMAL HIGH (ref 70–99)

## 2019-03-24 LAB — CBC
HCT: 39.3 % (ref 39.0–52.0)
Hemoglobin: 12.8 g/dL — ABNORMAL LOW (ref 13.0–17.0)
MCH: 31.4 pg (ref 26.0–34.0)
MCHC: 32.6 g/dL (ref 30.0–36.0)
MCV: 96.3 fL (ref 80.0–100.0)
Platelets: 160 10*3/uL (ref 150–400)
RBC: 4.08 MIL/uL — ABNORMAL LOW (ref 4.22–5.81)
RDW: 13.3 % (ref 11.5–15.5)
WBC: 6.8 10*3/uL (ref 4.0–10.5)
nRBC: 0 % (ref 0.0–0.2)

## 2019-03-24 LAB — BASIC METABOLIC PANEL
Anion gap: 7 (ref 5–15)
BUN: 13 mg/dL (ref 8–23)
CO2: 26 mmol/L (ref 22–32)
Calcium: 9 mg/dL (ref 8.9–10.3)
Chloride: 105 mmol/L (ref 98–111)
Creatinine, Ser: 1.21 mg/dL (ref 0.61–1.24)
GFR calc Af Amer: >60 mL/min (ref >60)
GFR calc non Af Amer: >60 mL/min (ref >60)
Glucose, Bld: 113 mg/dL — ABNORMAL HIGH (ref 70–99)
Potassium: 4.1 mmol/L (ref 3.5–5.1)
Sodium: 138 mmol/L (ref 135–145)

## 2019-03-24 LAB — HIV ANTIBODY (ROUTINE TESTING W REFLEX): HIV Screen 4th Generation wRfx: NONREACTIVE

## 2019-03-24 LAB — ECHOCARDIOGRAM COMPLETE
Height: 72 in
Weight: 4947.2 oz

## 2019-03-24 SURGERY — A-FLUTTER ABLATION

## 2019-03-24 MED ORDER — MIDAZOLAM HCL 5 MG/5ML IJ SOLN
INTRAMUSCULAR | Status: DC | PRN
  Administered 2019-03-24 (×5): 1 mg via INTRAVENOUS

## 2019-03-24 MED ORDER — APIXABAN 5 MG PO TABS: 5.0000 mg | ORAL_TABLET | Freq: Two times a day (BID) | ORAL | Status: DC

## 2019-03-24 MED ORDER — HEPARIN (PORCINE) IN NACL 1000-0.9 UT/500ML-% IV SOLN
INTRAVENOUS | Status: DC | PRN
  Administered 2019-03-24: 500 mL

## 2019-03-24 MED ORDER — BUPIVACAINE HCL (PF) 0.25 % IJ SOLN
INTRAMUSCULAR | Status: DC | PRN
  Administered 2019-03-24: 30 mL

## 2019-03-24 MED ORDER — MIDAZOLAM HCL 5 MG/5ML IJ SOLN
INTRAMUSCULAR | Status: AC
  Filled 2019-03-24: qty 5

## 2019-03-24 MED ORDER — SODIUM CHLORIDE 0.9 % IV SOLN: 250.0000 mL | INTRAVENOUS | Status: DC | PRN

## 2019-03-24 MED ORDER — HEPARIN (PORCINE) IN NACL 1000-0.9 UT/500ML-% IV SOLN
INTRAVENOUS | Status: AC
  Filled 2019-03-24: qty 500

## 2019-03-24 MED ORDER — TERAZOSIN HCL 5 MG PO CAPS
5.0000 mg | ORAL_CAPSULE | Freq: Every day | ORAL | Status: DC
  Administered 2019-03-24: 5 mg via ORAL
  Filled 2019-03-24 (×3): qty 1

## 2019-03-24 MED ORDER — ONDANSETRON HCL 4 MG/2ML IJ SOLN: 4.0000 mg | Freq: Four times a day (QID) | INTRAMUSCULAR | Status: DC | PRN

## 2019-03-24 MED ORDER — FENTANYL CITRATE (PF) 100 MCG/2ML IJ SOLN
INTRAMUSCULAR | Status: AC
  Filled 2019-03-24: qty 2

## 2019-03-24 MED ORDER — FENTANYL CITRATE (PF) 100 MCG/2ML IJ SOLN
INTRAMUSCULAR | Status: DC | PRN
  Administered 2019-03-24: 12.5 ug via INTRAVENOUS
  Administered 2019-03-24: 25 ug via INTRAVENOUS
  Administered 2019-03-24 (×2): 12.5 ug via INTRAVENOUS

## 2019-03-24 MED ORDER — SODIUM CHLORIDE 0.9% FLUSH
3.0000 mL | Freq: Two times a day (BID) | INTRAVENOUS | Status: DC
  Administered 2019-03-24 – 2019-03-25 (×2): 3 mL via INTRAVENOUS

## 2019-03-24 MED ORDER — ACETAMINOPHEN 325 MG PO TABS: 650.0000 mg | ORAL_TABLET | ORAL | Status: DC | PRN

## 2019-03-24 MED ORDER — BUPIVACAINE HCL (PF) 0.25 % IJ SOLN
INTRAMUSCULAR | Status: AC
  Filled 2019-03-24: qty 30

## 2019-03-24 MED ORDER — SODIUM CHLORIDE 0.9 % IV SOLN
INTRAVENOUS | Status: AC | PRN
  Administered 2019-03-24: 50 mL/h via INTRAVENOUS

## 2019-03-24 MED ORDER — SODIUM CHLORIDE 0.9% FLUSH: 3.0000 mL | INTRAVENOUS | Status: DC | PRN

## 2019-03-24 SURGICAL SUPPLY — 10 items
BAG SNAP BAND KOVER 36X36 (MISCELLANEOUS) ×1 IMPLANT
CATH BLAZERPRIME XP (ABLATOR) ×1 IMPLANT
CATH DUODECA HALO/ISMUS 7FR (CATHETERS) ×1 IMPLANT
CATH JOSEPH QUAD ALLRED 6F REP (CATHETERS) ×1 IMPLANT
PACK EP LATEX FREE (CUSTOM PROCEDURE TRAY) ×1
PACK EP LF (CUSTOM PROCEDURE TRAY) ×1 IMPLANT
PAD PRO RADIOLUCENT 2001M-C (PAD) ×2 IMPLANT
SHEATH PINNACLE 6F 10CM (SHEATH) ×1 IMPLANT
SHEATH PINNACLE 8F 10CM (SHEATH) ×2 IMPLANT
SHIELD RADPAD SCOOP 12X17 (MISCELLANEOUS) ×1 IMPLANT

## 2019-03-24 NOTE — Progress Notes (Signed)
  Echocardiogram 2D Echocardiogram has been performed.  Juan Calderon 03/24/2019, 12:38 PM

## 2019-03-24 NOTE — Interval H&P Note (Signed)
History and Physical Interval Note:  03/24/2019 12:59 PM  Juan Calderon  has presented today for surgery, with the diagnosis of aflutter.  The various methods of treatment have been discussed with the patient and family. After consideration of risks, benefits and other options for treatment, the patient has consented to  Procedure(s): A-FLUTTER ABLATION (N/A) as a surgical intervention.  The patient's history has been reviewed, patient examined, no change in status, stable for surgery.  I have reviewed the patient's chart and labs.  Questions were answered to the patient's satisfaction.     Cristopher Peru

## 2019-03-24 NOTE — Addendum Note (Signed)
Addended by: Lalla Brothers T on: 03/24/2019 09:14 AM   Modules accepted: Orders

## 2019-03-24 NOTE — Progress Notes (Signed)
Site Area:  RFV Site prior to removal: Level 0   Pressure applied for:  15 minutes Manual:  yes Pt status during pull:  stable Post pull site:  Level 0 Post pull instructions given:  yes Post pull pulses present:  yes Dressing applied:  Gauze/tegaderm Bedrest begins @:  1440 Comments: Removed by Talmadge Chad B

## 2019-03-24 NOTE — Progress Notes (Signed)
Pt has been seen by EP service for atrial flutter   Plan for ablation today   Will follow  Dorris Carnes MD

## 2019-03-24 NOTE — Consult Note (Addendum)
Cardiology Consultation:   Patient ID: [name] MRN: [id number]; DOB: 04/14/1956  Admit date: 03/23/2019 Date of Consult: 03/24/2019  Primary Care Provider: Axel Filler, MD Primary Cardiologist: Lauree Chandler, MD  Primary Electrophysiologist:  [name], MD    Patient Profile:   Juan Calderon is a 63 y.o. male with a hx of chronic CHF (diastolic), CAD (non-obstructive by cath in 2007, COPD, HTN, HLD, gout, GERD, DM,  Chronic venous stasis (PMD manages), morbid obesity, OSA (untreated),  persistent AFib who is being seen today for the evaluation of AFlutter at the request of Dr. Harrington Challenger.   AFib Hx: DCCV 2016 AAD: Flecainide, 2016 Hx of digoxin stopped with rates 30's and BB was reduced 2016 Intolerant of CPAP pending perhaps an oral device BB was stopped 05/2016 ? Bradycardia resumed in 2017 after a CP episode  History of Present Illness:   [name] last saw Dr. [name] in Oct 2019, at that time by symptoms felt to have been maintaining SR, was still working on finding a tolerable and affordable sleep apnea therapy, mentioned chronic SOB and edema.  Dietary indiscretions with increasing weight.  No changes were made to his tx.  Was in SR that visit with stable intervals. Virtual visit with Dr. Angelena Form with stable symptoms,  Noting the patient taking extra torsemide fairly frequently, mentions he develops renal insufficiency easily with increased rputine diuresis, no changes were made.  The patient was admitted to [facility] via his PMD office with a couple  Months of worsening SOB and DOE, PMD did an EKG with suspect Mobitz II AVblock, ST/T changes and referred to the ER  Cardiology was consulted, EKG done at PMD noted AFlutter with variable conduction/CVR,  Not block.  Not felt to have any anginal symptoms, DOE/SOB likely 2/2 AFlutter, morbid obesity, untreated OSA and p.HTN.  Planned for update on his echo.  EP was consulted to consider a flutter ablation.  LABS  HS Trop 19 > 21  K+ 4.5 >> 4.1 Mag 1.8 BUN/Creat 14/1.43 > 13/1.21 WBC 6.8 H/H 13/41 Plts 164 TSH 3.974  Patient agrees with above, has had less energy, easier SOB with less activity for a couple months.  SOB makes his chest feel a little heavy no CP otherwise.  No syncope or near syncope, no overt palpitations   Heart Pathway Score:     Past Medical History:  Diagnosis Date  . Alcohol abuse    . Bradycardia   . C6 radiculopathy 01/24/2016   Right upper extremity, mild to moderate electrically by EMG on 01/24/2016  . Cataract    Left eye  . Chronic diastolic heart failure ([place])     with mild left ventricular hypertrophy on Echo 02/2010  . Chronic obstructive pulmonary disease ([place])    . Chronic osteomyelitis of femur ([place]) 04/06/2016  . Chronic osteomyelitis of left femur ([place]) 11/22/2017   Left femur s/p prior trauma  . Chronic osteomyelitis of left femur ([place]) 11/22/2017   Brodie's abscess: left femur s/p prior trauma.  Underwent partial excision and curettage of left femoral osteomyelitis at [facility] 12/30/2017 with grossly purulent material encountered within the medullary canal of the left distal femur.  Cultures grew MSSA.  Post-operatively received 6 weeks of IV antibiotics through 02/10/2018.  CRP elevated at 60.3 at end of IV antibiotic course so continued on Keflex  . Chronic pain syndrome     Left arm and leg s/p traumatic injury   . Chronic renal insufficiency    .  Coronary artery disease     25% LAD stenosis on cath 2007.  Stable angina.  . Diverticulosis    . Diverticulosis 11/12/2013  . Essential hypertension    . Frequent PVCs   . Gastroesophageal reflux disease    . Gout    . Hyperlipidemia LDL goal < 100    . Internal hemorrhoids without complication 80/32/1224  . Long-term current use of opiate analgesic 09/07/2016  . Mild carpal tunnel syndrome of right wrist 01/24/2016   Mild degree electrically per EMG 01/24/2016   . Mild carpal tunnel syndrome of right wrist  01/24/2016   Mild degree electrically per EMG 01/24/2016   . Morbid obesity with BMI of 40.0-44.9, adult ([place])    . Normocytic anemia    . NSVT (nonsustained ventricular tachycardia) ([place])   . Obstructive sleep apnea     Moderate, AHI 29.8 per hour with moderately loud snoring and oxygen desaturation to a nadir of 79%. CPAP titration resulted in a prescription for 17 CWP.    [name] Open-angle glaucoma    . Osteoarthritis cervical spine    . Osteoarthritis of left knee 06/19/2013   Tricompartmental disease.  Treated with double hinged upright knee brace, steroid/xylocaine knee injections, and NSAIDs   . Osteoporosis 05/14/2017   s/p fracture of the right humerus from a fall at ground hight  . Persistent atrial fibrillation   . Right rotator cuff tear     Large full-thickness tear of the supraspinatus with mild retraction but no atrophy   . Right rotator cuff tear 04/25/2013   Large full-thickness tear of the supraspinatus with mild retraction but no atrophy    . Secondary male hypogonadism 02/07/2017   Likely secondary to chronic opioid use  . Secondary male hypogonadism 02/07/2017   Likely secondary to chronic opioid use  . Subclinical hypothyroidism    . Tubular adenoma of colon 11/22/2017   Specifics unknown.  Repeat colonoscopy 08/12/2018 with six 3-6 mm tubular adenomas removed endoscopically.  . 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 ([place])    . Vasomotor rhinitis 04/25/2013    Past Surgical History:  Procedure Laterality Date  . CARDIOVERSION N/A 12/30/2014   Procedure: CARDIOVERSION;  Surgeon: [name], MD;  Location: [facility] ENDOSCOPY;  Service: Cardiovascular;  Laterality: N/A;  . FRACTURE SURGERY Left 1980's   Elbow  . Left arm surgery    . Left leg surgery    . SHOULDER SURGERY     Right     Home Medications:  Prior to Admission medications   Medication Sig Start Date End Date Taking? Authorizing Provider  albuterol (PROVENTIL HFA;VENTOLIN HFA)  108 (90 Base) MCG/ACT inhaler Inhale 1-2 puffs into the lungs every 6 (six) hours as needed for shortness of breath. 07/18/18  Yes Oval Linsey, MD  allopurinol (ZYLOPRIM) 300 MG tablet Take 1 tablet (300 mg total) by mouth daily. Please note the change in the number of tablets to take daily. 03/18/18  Yes Oval Linsey, MD  atorvastatin (LIPITOR) 20 MG tablet Take 1 tablet (20 mg total) by mouth daily. 06/03/18  Yes Oval Linsey, MD  Dextromethorphan-guaiFENesin [facility] DM PO) Take 10 mLs by mouth every 4 (four) hours as needed (cough).   Yes [provider]  ELIQUIS 5 MG TABS tablet TAKE 1 TABLET BY MOUTH TWICE A DAY Patient taking differently: Take 5 mg by mouth 2 (two) times daily.  11/24/18  Yes Deboraha Sprang, MD  esomeprazole (NEXIUM) 40 MG capsule Take 1  capsule (40 mg total) by mouth daily. 10/24/18  Yes Oval Linsey, MD  flecainide (TAMBOCOR) 100 MG tablet Take 1 tablet (100 mg total) by mouth every 12 (twelve) hours. 10/27/18  Yes Oval Linsey, MD  latanoprost (XALATAN) 0.005 % ophthalmic solution Place 1 drop into both eyes at bedtime. 10/11/16  Yes Oval Linsey, MD  magnesium oxide (MAG-OX) 400 MG tablet Take 2 tablets by mouth 2 (two) times daily. 02/24/18  Yes [provider]  metFORMIN (GLUCOPHAGE) 1000 MG tablet Take 1 tablet (1,000 mg total) by mouth 2 (two) times daily with a meal. 10/27/18  Yes Oval Linsey, MD  metoprolol tartrate (LOPRESSOR) 25 MG tablet TAKE 1 TABLET BY MOUTH TWICE A DAY Patient taking differently: Take 25 mg by mouth 2 (two) times daily.  05/07/18  Yes Oval Linsey, MD  nitroGLYCERIN (NITROSTAT) 0.4 MG SL tablet Place 1 tablet (0.4 mg total) under the tongue every 5 (five) minutes as needed for chest pain. 07/18/18  Yes Oval Linsey, MD  oxyCODONE-acetaminophen (PERCOCET) 10-325 MG tablet Take 1 tablet by mouth every 6 (six) hours as needed for pain. 03/23/19 03/22/20 Yes Axel Filler, MD  Sorbitol 70 % SOLN Take 15-60  mLs by mouth daily as needed. Patient taking differently: Take 60 mLs by mouth daily as needed (constipation).  07/18/18  Yes Oval Linsey, MD  terazosin (HYTRIN) 5 MG capsule Take 1 capsule (5 mg total) by mouth at bedtime. 04/28/18  Yes Oval Linsey, MD  torsemide (DEMADEX) 20 MG tablet TAKE 2 TABLETS BY MOUTH 3 TIMES PER WEEK Patient taking differently: Take 40 mg by mouth every Monday, Wednesday, and Friday.  02/19/19  Yes Axel Filler, MD  Blood Glucose Monitoring Suppl [facility] VERIO) w/Device KIT 1 each by Does not apply route daily. 11/04/15   Loleta Chance, MD  glucose blood [facility] VERIO) test strip Use as instructed 05/24/16   Oval Linsey, MD  [facility] DELICA LANCETS 00P MISC Use 1 strip daily 05/24/16   Oval Linsey, MD    Inpatient Medications: Scheduled Meds: . allopurinol  300 mg Oral Daily  . apixaban  5 mg Oral BID  . atorvastatin  20 mg Oral Daily  . flecainide  100 mg Oral Q12H  . insulin aspart  0-15 Units Subcutaneous TID WC  . metoprolol tartrate  12.5 mg Oral BID  . pantoprazole  40 mg Oral Daily  . [START ON 03/25/2019] torsemide  20 mg Oral 3 times weekly   Continuous Infusions:  PRN Meds: acetaminophen **OR** acetaminophen, albuterol, nitroGLYCERIN, oxyCODONE-acetaminophen **AND** oxyCODONE, senna-docusate  Allergies:    Allergies  Allergen Reactions  . Ramipril Swelling    Facial swelling  . Tape Other (See Comments)    Irritates skin/ pls use paper tape  . Testosterone Rash    Social History:   Social History   Socioeconomic History  . Marital status: Widowed    Spouse name: Not on file  . Number of children: Not on file  . Years of education: Not on file  . Highest education level: Not on file  Occupational History  . Not on file  Social Needs  . Financial resource strain: Not on file  . Food insecurity    Worry: Not on file    Inability: Not on file  . Transportation needs    Medical: Not on file    Non-medical: Not  on file  Tobacco Use  . Smoking status: Never Smoker  . Smokeless tobacco: Never Used  Substance and Sexual  Activity  . Alcohol use: Yes    Alcohol/week: 14.0 standard drinks    Types: 14 Cans of beer per week  . Drug use: No  . Sexual activity: Yes  Lifestyle  . Physical activity    Days per week: Not on file    Minutes per session: Not on file  . Stress: Not on file  Relationships  . Social Herbalist on phone: Not on file    Gets together: Not on file    Attends religious service: Not on file    Active member of club or organization: Not on file    Attends meetings of clubs or organizations: Not on file    Relationship status: Not on file  . Intimate partner violence    Fear of current or ex partner: Not on file    Emotionally abused: Not on file    Physically abused: Not on file    Forced sexual activity: Not on file  Other Topics Concern  . Not on file  Social History Narrative  . Not on file    Family History:   Family History  Problem Relation Age of Onset  . Heart failure Mother   . Alzheimer's disease Father   . Heart failure Brother   . Osteoarthritis Brother   . Prostate cancer Brother   . Early death Brother        Manufacturing systems engineer  . Hypertension Sister   . Hypertension Brother   . Heart attack Neg Hx   . Stroke Neg Hx   . Colon cancer Neg Hx   . Esophageal cancer Neg Hx   . Pancreatic cancer Neg Hx   . Stomach cancer Neg Hx   . Liver disease Neg Hx      ROS:  Please see the history of present illness.  All other ROS reviewed and negative.     Physical Exam/Data:   Vitals:   03/23/19 1621 03/23/19 1950 03/24/19 0030 03/24/19 0456  BP: 119/78 125/73 (!) 132/91 116/78  Pulse: (!) 52 61 75 72  Resp: _0 Temp: 97.8 F (36.6 C) 98 F (36.7 C) 97.7 F (36.5 C) 98 F (36.7 C)  TempSrc: Oral Oral Oral Oral  SpO2: 97% 97% 96% 95%  Weight:    (!) 140.3 kg  Height:        Intake/Output Summary (Last 24 hours) at 03/24/2019  0808 Last data filed at 03/24/2019 0458 Gross per 24 hour  Intake 1058 ml  Output 1025 ml  Net 33 ml   Last 3 Weights 03/24/2019 03/23/2019 03/23/2019  Weight (lbs) 309 lb 3.2 oz 311 lb 319 lb 6.4 oz  Weight (kg) 140.252 kg 141.069 kg 144.879 kg     Body mass index is 41.94 kg/m.  General:  Well nourished, well developed, in no acute distress HEENT: normal Lymph: no adenopathy Neck: no JVD Endocrine:  No thryomegaly Vascular: No carotid bruits Cardiac:  irreg-irreg; no murmurs, gallops or rubs Lungs:  CTA b/l, no wheezing, rhonchi or rales  Abd: soft, nontender, no hepatomegaly  Ext: no pitting edema Musculoskeletal:  No deformities,  Skin: warm and dry  Neuro:  no focal abnormalities noted Psych:  Normal affect   EKG:  The EKG was personally reviewed and demonstrates:    Yesterday media tab EKG Aflutter 71bpm, no clear ischemic looking changes AFlutter 84bpm, nonspecific ST/T changes, QRS 145m AFlutter 60, no specific  ST/T changes, QRS 1156m Telemetry:  Telemetry was personally reviewed and demonstrates:   AFlutter, 50's-60's  Relevant CV Studies:  Stress myoview 11/25/14: Impression Exercise Capacity: Lexiscan with no exercise. BP Response: Normal blood pressure response. Clinical Symptoms: There is dyspnea and chest pressure ECG Impression: No significant ST segment change suggestive of ischemia. Comparison with Prior Nuclear Study: Compared to 05/10/12, no change. Overall Impression: Normal stress nuclear study. LV Ejection Fraction:Study not gated. . LV Wall Motion: Study not gated due to atrial fibrillation; there appears to be significant LVE.  Echo May 2016: Left ventricle: LVEF is approximately 55 to 60% The cavity size was normal. Wall thickness was increased in a pattern of mild LVH. - Pulmonary arteries: PA peak pressure: 39 mm Hg (S).   Laboratory Data:  High Sensitivity Troponin:   Recent Labs  Lab 03/23/19 1030 03/23/19 1830   TROPONINIHS 19* 21*     Cardiac EnzymesNo results for input(s): TROPONINI in the last 168 hours. No results for input(s): TROPIPOC in the last 168 hours.  Chemistry Recent Labs  Lab 03/23/19 1030 03/24/19 0424  NA 138 138  K 4.5 4.1  CL 102 105  CO2 27 26  GLUCOSE 155* 113*  BUN 14 13  CREATININE 1.43* 1.21  CALCIUM 9.1 9.0  GFRNONAA 51* >60  GFRAA 60* >60  ANIONGAP 9 7    Recent Labs  Lab 03/23/19 1030  PROT 6.3*  ALBUMIN 3.6  AST 23  ALT 30  ALKPHOS 63  BILITOT 0.7   Hematology Recent Labs  Lab 03/23/19 1030 03/24/19 0424  WBC 6.8 6.8  RBC 4.25 4.08*  HGB 13.5 12.8*  HCT 41.6 39.3  MCV 97.9 96.3  MCH 31.8 31.4  MCHC 32.5 32.6  RDW 13.3 13.3  PLT 164 160   BNPNo results for input(s): BNP, PROBNP in the last 168 hours.  DDimer No results for input(s): DDIMER in the last 168 hours.   Radiology/Studies:   Dg Chest 2 View Result Date: 03/23/2019 CLINICAL DATA:  Shortness of breath for 2-3 days, negative COVID-19 test EXAM: CHEST - 2 VIEW COMPARISON:  01/26/2017 FINDINGS: Cardiac shadow is enlarged but stable. Vascular congestion is noted slightly greater than that seen on the prior exam although no edema is seen. No effusion is noted. No bony abnormality is seen. IMPRESSION: Slight increase in vascular congestion without definitive edema. Electronically Signed   By: Inez Catalina M.D.   On: 03/23/2019 20:18    Assessment and Plan:   1. Persistent AFib 2. New Aflutter w/CVR (typical)     CHA2DS2Vasc is 2, on Eliquis, appropriately dosed with no reports missed doses in >4 weeks     Continue Flecainide and metoprolol, has served him well  Dr. Lovena Le has seen and examined the patient The patient would like to pursue ablation for his flutter Pending d/w Dr. Lovena Le and Dr. [name], perhaps today  3. DOE/SOB     Exam does not suggest avert fluid OL     Updated echo is pending     Likely his  flutter     For questions or updates, please contact [facility] Please consult [email or web address] for contact info under   Signed, Baldwin Jamaica, PA-C  03/24/2019 8:08 AM   EP Attending  Patient seen and examined. Agree with above. He remains in what appears to be typical atrial flutter. I have discussed the indications/risks/benefits/goals/expectations of EPS/RFA of atrial flutter and he wishes to proceed.  Mikle Bosworth.D.

## 2019-03-24 NOTE — Progress Notes (Signed)
   Subjective: No acute events overnight. Patient was NPO overnight for his procedure today. Patient reports continued dyspnea with exertion especially walking to the bathroom, but denies associated chest pain/pressure, palpitations, or lightheadedness. Patient denies increased swelling or fluid retention.   There was some concern about patient's alcohol use: he reports drinking alcohol very rarely and denies recent increase.   Objective:  Vital signs in last 24 hours: Vitals:   03/23/19 1621 03/23/19 1950 03/24/19 0030 03/24/19 0456  BP: 119/78 125/73 (!) 132/91 116/78  Pulse: (!) 52 61 75 72  Resp: 18 20 18 18   Temp: 97.8 F (36.6 C) 98 F (36.7 C) 97.7 F (36.5 C) 98 F (36.7 C)  TempSrc: Oral Oral Oral Oral  SpO2: 97% 97% 96% 95%  Weight:    (!) 140.3 kg  Height:       Weight change:   Intake/Output Summary (Last 24 hours) at 03/24/2019 1038 Last data filed at 03/24/2019 0458 Gross per 24 hour  Intake 1058 ml  Output 1025 ml  Net 33 ml   Physical Exam Vitals signs reviewed.  Constitutional:      General: He is not in acute distress.    Appearance: He is obese.  HENT:     Head: Normocephalic.  Eyes:     Extraocular Movements: Extraocular movements intact.  Cardiovascular:     Rate and Rhythm: Normal rate.     Heart sounds: No murmur.  Pulmonary:     Effort: Pulmonary effort is normal. No respiratory distress.     Breath sounds: Normal breath sounds. No rales.  Musculoskeletal:     Right lower leg: Edema present.     Left lower leg: Edema present.  Skin:    General: Skin is warm.  Neurological:     Mental Status: He is alert.     Assessment/Plan:  Active Problems:   * No active hospital problems. *   Juan Calderon is a 63 y.o. with PMHx of atrial flutter, HFpEF, HTN admitted 7/20 for evaluation of new atrial flutter.   #new onset Atrial Flutter 7/20 EKG obtained by cardiology confirmed pattern of typical atrial flutter. Risk factors include use of  antiarrhythmic drug, obesity, OSA, HTN and CHF. Patient is being followed by cardiology, who have scheduled an ablation today. Their recommendation to continue flecainide and metoprolol. Echocardiogram scheduled today will evaluate for any new structural abnormalities.   Today patient is stable, with no new complaints. Previous bradycardia to 50s, which has improved to 70s today. Remaining vitals are within normal limits.    #CHF Patient's 7/20 CXR revealed increased vascular congestion without definitive edema. Patient denies increased swelling, lower extremity or fluid overload. Per chart history, patient's dry weight appears to be ~139 kg; his weight today is 140.3kg. Less concern for hypervolemia or decompensation. Plan to continue home toresmide. Echocardiogram scheduled today to evaluate new changes in function.   #CAD Heart cath in 2007, patient with non obstructive CAD. Presenting symptoms were concerning for ischemia. Cardiology impression is that his dyspnea is multifactorial related to chronic disease history and new onset atrial flutter. Again, echocardiogram today will evaluation for changes in function and new abnormalities.   Plan -manage per cardiology recommendations -continue home medications: flecainide, metoprolol, Eliquis, torsemide, atorvastatin -telemetry -daily weights, I/Os -advance diet post procedure    LOS: 1 day   Mercy Moore, Medical Student 03/24/2019, 11:59 AM Pager [phone]

## 2019-03-24 NOTE — H&P (View-Only) (Signed)
Cardiology Consultation:   Patient ID: [name] MRN: [id number]; DOB: 04/14/1956  Admit date: 03/23/2019 Date of Consult: 03/24/2019  Primary Care Provider: Axel Filler, MD Primary Cardiologist: Lauree Chandler, MD  Primary Electrophysiologist:  [name], MD    Patient Profile:   Juan Calderon is a 63 y.o. male with a hx of chronic CHF (diastolic), CAD (non-obstructive by cath in 2007, COPD, HTN, HLD, gout, GERD, DM,  Chronic venous stasis (PMD manages), morbid obesity, OSA (untreated),  persistent AFib who is being seen today for the evaluation of AFlutter at the request of Dr. Harrington Challenger.   AFib Hx: DCCV 2016 AAD: Flecainide, 2016 Hx of digoxin stopped with rates 30's and BB was reduced 2016 Intolerant of CPAP pending perhaps an oral device BB was stopped 05/2016 ? Bradycardia resumed in 2017 after a CP episode  History of Present Illness:   Juan Calderon last saw Dr. [name] in Oct 2019, at that time by symptoms felt to have been maintaining SR, was still working on finding a tolerable and affordable sleep apnea therapy, mentioned chronic SOB and edema.  Dietary indiscretions with increasing weight.  No changes were made to his tx.  Was in SR that visit with stable intervals. Virtual visit with Dr. Angelena Form with stable symptoms,  Noting the patient taking extra torsemide fairly frequently, mentions he develops renal insufficiency easily with increased rputine diuresis, no changes were made.  The patient was admitted to [facility] via his PMD office with a couple  Months of worsening SOB and DOE, PMD did an EKG with suspect Mobitz II AVblock, ST/T changes and referred to the ER  Cardiology was consulted, EKG done at PMD noted AFlutter with variable conduction/CVR,  Not block.  Not felt to have any anginal symptoms, DOE/SOB likely 2/2 AFlutter, morbid obesity, untreated OSA and p.HTN.  Planned for update on his echo.  EP was consulted to consider a flutter ablation.  LABS  HS Trop 19 > 21  K+ 4.5 >> 4.1 Mag 1.8 BUN/Creat 14/1.43 > 13/1.21 WBC 6.8 H/H 13/41 Plts 164 TSH 3.974  Patient agrees with above, has had less energy, easier SOB with less activity for a couple months.  SOB makes his chest feel a little heavy no CP otherwise.  No syncope or near syncope, no overt palpitations   Heart Pathway Score:     Past Medical History:  Diagnosis Date  . Alcohol abuse    . Bradycardia   . C6 radiculopathy 01/24/2016   Right upper extremity, mild to moderate electrically by EMG on 01/24/2016  . Cataract    Left eye  . Chronic diastolic heart failure ([place])     with mild left ventricular hypertrophy on Echo 02/2010  . Chronic obstructive pulmonary disease ([place])    . Chronic osteomyelitis of femur ([place]) 04/06/2016  . Chronic osteomyelitis of left femur ([place]) 11/22/2017   Left femur s/p prior trauma  . Chronic osteomyelitis of left femur ([place]) 11/22/2017   Brodie's abscess: left femur s/p prior trauma.  Underwent partial excision and curettage of left femoral osteomyelitis at [facility] 12/30/2017 with grossly purulent material encountered within the medullary canal of the left distal femur.  Cultures grew MSSA.  Post-operatively received 6 weeks of IV antibiotics through 02/10/2018.  CRP elevated at 60.3 at end of IV antibiotic course so continued on Keflex  . Chronic pain syndrome     Left arm and leg s/p traumatic injury   . Chronic renal insufficiency    .  Coronary artery disease     25% LAD stenosis on cath 2007.  Stable angina.  . Diverticulosis    . Diverticulosis 11/12/2013  . Essential hypertension    . Frequent PVCs   . Gastroesophageal reflux disease    . Gout    . Hyperlipidemia LDL goal < 100    . Internal hemorrhoids without complication 80/32/1224  . Long-term current use of opiate analgesic 09/07/2016  . Mild carpal tunnel syndrome of right wrist 01/24/2016   Mild degree electrically per EMG 01/24/2016   . Mild carpal tunnel syndrome of right wrist  01/24/2016   Mild degree electrically per EMG 01/24/2016   . Morbid obesity with BMI of 40.0-44.9, adult ([place])    . Normocytic anemia    . NSVT (nonsustained ventricular tachycardia) ([place])   . Obstructive sleep apnea     Moderate, AHI 29.8 per hour with moderately loud snoring and oxygen desaturation to a nadir of 79%. CPAP titration resulted in a prescription for 17 CWP.    [name] Open-angle glaucoma    . Osteoarthritis cervical spine    . Osteoarthritis of left knee 06/19/2013   Tricompartmental disease.  Treated with double hinged upright knee brace, steroid/xylocaine knee injections, and NSAIDs   . Osteoporosis 05/14/2017   s/p fracture of the right humerus from a fall at ground hight  . Persistent atrial fibrillation   . Right rotator cuff tear     Large full-thickness tear of the supraspinatus with mild retraction but no atrophy   . Right rotator cuff tear 04/25/2013   Large full-thickness tear of the supraspinatus with mild retraction but no atrophy    . Secondary male hypogonadism 02/07/2017   Likely secondary to chronic opioid use  . Secondary male hypogonadism 02/07/2017   Likely secondary to chronic opioid use  . Subclinical hypothyroidism    . Tubular adenoma of colon 11/22/2017   Specifics unknown.  Repeat colonoscopy 08/12/2018 with six 3-6 mm tubular adenomas removed endoscopically.  . 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 ([place])    . Vasomotor rhinitis 04/25/2013    Past Surgical History:  Procedure Laterality Date  . CARDIOVERSION N/A 12/30/2014   Procedure: CARDIOVERSION;  Surgeon: [name], MD;  Location: [facility] ENDOSCOPY;  Service: Cardiovascular;  Laterality: N/A;  . FRACTURE SURGERY Left 1980's   Elbow  . Left arm surgery    . Left leg surgery    . SHOULDER SURGERY     Right     Home Medications:  Prior to Admission medications   Medication Sig Start Date End Date Taking? Authorizing Provider  albuterol (PROVENTIL HFA;VENTOLIN HFA)  108 (90 Base) MCG/ACT inhaler Inhale 1-2 puffs into the lungs every 6 (six) hours as needed for shortness of breath. 07/18/18  Yes Oval Linsey, MD  allopurinol (ZYLOPRIM) 300 MG tablet Take 1 tablet (300 mg total) by mouth daily. Please note the change in the number of tablets to take daily. 03/18/18  Yes Oval Linsey, MD  atorvastatin (LIPITOR) 20 MG tablet Take 1 tablet (20 mg total) by mouth daily. 06/03/18  Yes Oval Linsey, MD  Dextromethorphan-guaiFENesin [facility] DM PO) Take 10 mLs by mouth every 4 (four) hours as needed (cough).   Yes [provider]  ELIQUIS 5 MG TABS tablet TAKE 1 TABLET BY MOUTH TWICE A DAY Patient taking differently: Take 5 mg by mouth 2 (two) times daily.  11/24/18  Yes Deboraha Sprang, MD  esomeprazole (NEXIUM) 40 MG capsule Take 1  capsule (40 mg total) by mouth daily. 10/24/18  Yes Oval Linsey, MD  flecainide (TAMBOCOR) 100 MG tablet Take 1 tablet (100 mg total) by mouth every 12 (twelve) hours. 10/27/18  Yes Oval Linsey, MD  latanoprost (XALATAN) 0.005 % ophthalmic solution Place 1 drop into both eyes at bedtime. 10/11/16  Yes Oval Linsey, MD  magnesium oxide (MAG-OX) 400 MG tablet Take 2 tablets by mouth 2 (two) times daily. 02/24/18  Yes [provider]  metFORMIN (GLUCOPHAGE) 1000 MG tablet Take 1 tablet (1,000 mg total) by mouth 2 (two) times daily with a meal. 10/27/18  Yes Oval Linsey, MD  metoprolol tartrate (LOPRESSOR) 25 MG tablet TAKE 1 TABLET BY MOUTH TWICE A DAY Patient taking differently: Take 25 mg by mouth 2 (two) times daily.  05/07/18  Yes Oval Linsey, MD  nitroGLYCERIN (NITROSTAT) 0.4 MG SL tablet Place 1 tablet (0.4 mg total) under the tongue every 5 (five) minutes as needed for chest pain. 07/18/18  Yes Oval Linsey, MD  oxyCODONE-acetaminophen (PERCOCET) 10-325 MG tablet Take 1 tablet by mouth every 6 (six) hours as needed for pain. 03/23/19 03/22/20 Yes Axel Filler, MD  Sorbitol 70 % SOLN Take 15-60  mLs by mouth daily as needed. Patient taking differently: Take 60 mLs by mouth daily as needed (constipation).  07/18/18  Yes Oval Linsey, MD  terazosin (HYTRIN) 5 MG capsule Take 1 capsule (5 mg total) by mouth at bedtime. 04/28/18  Yes Oval Linsey, MD  torsemide (DEMADEX) 20 MG tablet TAKE 2 TABLETS BY MOUTH 3 TIMES PER WEEK Patient taking differently: Take 40 mg by mouth every Monday, Wednesday, and Friday.  02/19/19  Yes Axel Filler, MD  Blood Glucose Monitoring Suppl [facility] VERIO) w/Device KIT 1 each by Does not apply route daily. 11/04/15   Loleta Chance, MD  glucose blood [facility] VERIO) test strip Use as instructed 05/24/16   Oval Linsey, MD  [facility] DELICA LANCETS 00P MISC Use 1 strip daily 05/24/16   Oval Linsey, MD    Inpatient Medications: Scheduled Meds: . allopurinol  300 mg Oral Daily  . apixaban  5 mg Oral BID  . atorvastatin  20 mg Oral Daily  . flecainide  100 mg Oral Q12H  . insulin aspart  0-15 Units Subcutaneous TID WC  . metoprolol tartrate  12.5 mg Oral BID  . pantoprazole  40 mg Oral Daily  . [START ON 03/25/2019] torsemide  20 mg Oral 3 times weekly   Continuous Infusions:  PRN Meds: acetaminophen **OR** acetaminophen, albuterol, nitroGLYCERIN, oxyCODONE-acetaminophen **AND** oxyCODONE, senna-docusate  Allergies:    Allergies  Allergen Reactions  . Ramipril Swelling    Facial swelling  . Tape Other (See Comments)    Irritates skin/ pls use paper tape  . Testosterone Rash    Social History:   Social History   Socioeconomic History  . Marital status: Widowed    Spouse name: Not on file  . Number of children: Not on file  . Years of education: Not on file  . Highest education level: Not on file  Occupational History  . Not on file  Social Needs  . Financial resource strain: Not on file  . Food insecurity    Worry: Not on file    Inability: Not on file  . Transportation needs    Medical: Not on file    Non-medical: Not  on file  Tobacco Use  . Smoking status: Never Smoker  . Smokeless tobacco: Never Used  Substance and Sexual  Activity  . Alcohol use: Yes    Alcohol/week: 14.0 standard drinks    Types: 14 Cans of beer per week  . Drug use: No  . Sexual activity: Yes  Lifestyle  . Physical activity    Days per week: Not on file    Minutes per session: Not on file  . Stress: Not on file  Relationships  . Social Herbalist on phone: Not on file    Gets together: Not on file    Attends religious service: Not on file    Active member of club or organization: Not on file    Attends meetings of clubs or organizations: Not on file    Relationship status: Not on file  . Intimate partner violence    Fear of current or ex partner: Not on file    Emotionally abused: Not on file    Physically abused: Not on file    Forced sexual activity: Not on file  Other Topics Concern  . Not on file  Social History Narrative  . Not on file    Family History:   Family History  Problem Relation Age of Onset  . Heart failure Mother   . Alzheimer's disease Father   . Heart failure Brother   . Osteoarthritis Brother   . Prostate cancer Brother   . Early death Brother        Manufacturing systems engineer  . Hypertension Sister   . Hypertension Brother   . Heart attack Neg Hx   . Stroke Neg Hx   . Colon cancer Neg Hx   . Esophageal cancer Neg Hx   . Pancreatic cancer Neg Hx   . Stomach cancer Neg Hx   . Liver disease Neg Hx      ROS:  Please see the history of present illness.  All other ROS reviewed and negative.     Physical Exam/Data:   Vitals:   03/23/19 1621 03/23/19 1950 03/24/19 0030 03/24/19 0456  BP: 119/78 125/73 (!) 132/91 116/78  Pulse: (!) 52 61 75 72  Resp: _0 Temp: 97.8 F (36.6 C) 98 F (36.7 C) 97.7 F (36.5 C) 98 F (36.7 C)  TempSrc: Oral Oral Oral Oral  SpO2: 97% 97% 96% 95%  Weight:    (!) 140.3 kg  Height:        Intake/Output Summary (Last 24 hours) at 03/24/2019  0808 Last data filed at 03/24/2019 0458 Gross per 24 hour  Intake 1058 ml  Output 1025 ml  Net 33 ml   Last 3 Weights 03/24/2019 03/23/2019 03/23/2019  Weight (lbs) 309 lb 3.2 oz 311 lb 319 lb 6.4 oz  Weight (kg) 140.252 kg 141.069 kg 144.879 kg     Body mass index is 41.94 kg/m.  General:  Well nourished, well developed, in no acute distress HEENT: normal Lymph: no adenopathy Neck: no JVD Endocrine:  No thryomegaly Vascular: No carotid bruits Cardiac:  irreg-irreg; no murmurs, gallops or rubs Lungs:  CTA b/l, no wheezing, rhonchi or rales  Abd: soft, nontender, no hepatomegaly  Ext: no pitting edema Musculoskeletal:  No deformities,  Skin: warm and dry  Neuro:  no focal abnormalities noted Psych:  Normal affect   EKG:  The EKG was personally reviewed and demonstrates:    Yesterday media tab EKG Aflutter 71bpm, no clear ischemic looking changes AFlutter 84bpm, nonspecific ST/T changes, QRS 145m AFlutter 60, no specific  ST/T changes, QRS 1156m Telemetry:  Telemetry was personally reviewed and demonstrates:   AFlutter, 50's-60's  Relevant CV Studies:  Stress myoview 11/25/14: Impression Exercise Capacity: Lexiscan with no exercise. BP Response: Normal blood pressure response. Clinical Symptoms: There is dyspnea and chest pressure ECG Impression: No significant ST segment change suggestive of ischemia. Comparison with Prior Nuclear Study: Compared to 05/10/12, no change. Overall Impression: Normal stress nuclear study. LV Ejection Fraction:Study not gated. . LV Wall Motion: Study not gated due to atrial fibrillation; there appears to be significant LVE.  Echo May 2016: Left ventricle: LVEF is approximately 55 to 60% The cavity size was normal. Wall thickness was increased in a pattern of mild LVH. - Pulmonary arteries: PA peak pressure: 39 mm Hg (S).   Laboratory Data:  High Sensitivity Troponin:   Recent Labs  Lab 03/23/19 1030 03/23/19 1830   TROPONINIHS 19* 21*     Cardiac EnzymesNo results for input(s): TROPONINI in the last 168 hours. No results for input(s): TROPIPOC in the last 168 hours.  Chemistry Recent Labs  Lab 03/23/19 1030 03/24/19 0424  NA 138 138  K 4.5 4.1  CL 102 105  CO2 27 26  GLUCOSE 155* 113*  BUN 14 13  CREATININE 1.43* 1.21  CALCIUM 9.1 9.0  GFRNONAA 51* >60  GFRAA 60* >60  ANIONGAP 9 7    Recent Labs  Lab 03/23/19 1030  PROT 6.3*  ALBUMIN 3.6  AST 23  ALT 30  ALKPHOS 63  BILITOT 0.7   Hematology Recent Labs  Lab 03/23/19 1030 03/24/19 0424  WBC 6.8 6.8  RBC 4.25 4.08*  HGB 13.5 12.8*  HCT 41.6 39.3  MCV 97.9 96.3  MCH 31.8 31.4  MCHC 32.5 32.6  RDW 13.3 13.3  PLT 164 160   BNPNo results for input(s): BNP, PROBNP in the last 168 hours.  DDimer No results for input(s): DDIMER in the last 168 hours.   Radiology/Studies:   Dg Chest 2 View Result Date: 03/23/2019 CLINICAL DATA:  Shortness of breath for 2-3 days, negative COVID-19 test EXAM: CHEST - 2 VIEW COMPARISON:  01/26/2017 FINDINGS: Cardiac shadow is enlarged but stable. Vascular congestion is noted slightly greater than that seen on the prior exam although no edema is seen. No effusion is noted. No bony abnormality is seen. IMPRESSION: Slight increase in vascular congestion without definitive edema. Electronically Signed   By: Inez Catalina M.D.   On: 03/23/2019 20:18    Assessment and Plan:   1. Persistent AFib 2. New Aflutter w/CVR (typical)     CHA2DS2Vasc is 2, on Eliquis, appropriately dosed with no reports missed doses in >4 weeks     Continue Flecainide and metoprolol, has served him well  Dr. Lovena Le has seen and examined the patient The patient would like to pursue ablation for his flutter Pending d/w Dr. Lovena Le and Dr. [name], perhaps today  3. DOE/SOB     Exam does not suggest avert fluid OL     Updated echo is pending     Likely his  flutter     For questions or updates, please contact [facility] Please consult [email or web address] for contact info under   Signed, Baldwin Jamaica, PA-C  03/24/2019 8:08 AM   EP Attending  Patient seen and examined. Agree with above. He remains in what appears to be typical atrial flutter. I have discussed the indications/risks/benefits/goals/expectations of EPS/RFA of atrial flutter and he wishes to proceed.  Mikle Bosworth.D.

## 2019-03-24 NOTE — Discharge Instructions (Addendum)
You were admitted to the hospital due to a new abnormal rhythm of your heart. You were treated by cardiology with an ablation after which a normal heart rhythm was restored. You should follow up with your primary care provider and your cardiologist.  Post procedure care instructions No driving for 4 days. No lifting over 5 lbs for 1 week. No vigorous or sexual activity for 1 week. You may return to work on 03/31/2019. Keep procedure site clean & dry. If you notice increased pain, swelling, bleeding or pus, call/return!  You may shower, but no soaking baths/hot tubs/pools for 1 week.

## 2019-03-24 NOTE — Assessment & Plan Note (Signed)
Stable pain. Good benefit from daily chronic opioid therapy. I have refilled oxycodone 10mg  for another one month supply. I will replace a referral to orthopedics where he is followed for his neck pain. They also do steroid injections into his knees. It has been 7 months since he has been seen, says it is [facility], and that they will want a new referral.

## 2019-03-25 ENCOUNTER — Encounter (HOSPITAL_COMMUNITY): Payer: Self-pay | Admitting: Internal Medicine

## 2019-03-25 ENCOUNTER — Other Ambulatory Visit: Payer: Self-pay

## 2019-03-25 DIAGNOSIS — Z91048 Other nonmedicinal substance allergy status: Secondary | ICD-10-CM

## 2019-03-25 DIAGNOSIS — Z888 Allergy status to other drugs, medicaments and biological substances status: Secondary | ICD-10-CM

## 2019-03-25 LAB — GLUCOSE, CAPILLARY
Glucose-Capillary: 110 mg/dL — ABNORMAL HIGH (ref 70–99)
Glucose-Capillary: 118 mg/dL — ABNORMAL HIGH (ref 70–99)

## 2019-03-25 NOTE — Plan of Care (Signed)
  Problem: Education: Goal: Ability to demonstrate management of disease process will improve Outcome: Adequate for Discharge Goal: Ability to verbalize understanding of medication therapies will improve Outcome: Adequate for Discharge Goal: Individualized Educational Video(s) Outcome: Adequate for Discharge   Problem: Education: Goal: Ability to verbalize understanding of medication therapies will improve Outcome: Adequate for Discharge   Problem: Activity: Goal: Capacity to carry out activities will improve Outcome: Adequate for Discharge   Problem: Cardiac: Goal: Ability to achieve and maintain adequate cardiopulmonary perfusion will improve Outcome: Adequate for Discharge   Problem: Education: Goal: Knowledge of General Education information will improve Description: Including pain rating scale, medication(s)/side effects and non-pharmacologic comfort measures Outcome: Adequate for Discharge

## 2019-03-25 NOTE — Progress Notes (Signed)
   Subjective: No acute events overnight. Patient has no complaints after his ablation. Today he denies lightheadedness or chest pressure with walking to the bathroom.   Objective:  Vital signs in last 24 hours: Vitals:   03/24/19 1950 03/25/19 0523 03/25/19 0854 03/25/19 1000  BP: (!) 141/76 131/72 128/73   Pulse: (!) 58 (!) 57 73   Resp: 18 18 20    Temp: 98.1 F (36.7 C) 97.6 F (36.4 C) 98 F (36.7 C) 98 F (36.7 C)  TempSrc: Oral Oral Oral   SpO2: 100% 91% 95%   Weight:  (!) 140 kg    Height:       Weight change: -1.089 kg  Intake/Output Summary (Last 24 hours) at 03/25/2019 1149 Last data filed at 03/25/2019 0921 Gross per 24 hour  Intake 600 ml  Output 475 ml  Net 125 ml    Physical Exam Vitals signs reviewed.  Constitutional:      Appearance: He is obese.  HENT:     Head: Normocephalic and atraumatic.  Cardiovascular:     Rate and Rhythm: Normal rate and regular rhythm.     Heart sounds: Normal heart sounds.  Pulmonary:     Effort: Pulmonary effort is normal.     Breath sounds: Normal breath sounds. No wheezing.  Skin:    Comments: dressing to procedure site at right groin is clean dry and intact  Neurological:     Mental Status: He is alert.     Assessment/Plan:  Principal Problem:   Atrial flutter ([place])   Juan Calderon is a 63 y.o. man with PMHx of atrial fibrillation admitted two days ago from a routine [facility] visit with complaints of progressively lightheaded, chest pressure and dyspnea on exertion. EKG concerning for AV block.  #Atrial Flutter Patient had a successful ablation yesterday. Echocardiogram revealed normal systolic function with EF 55-60%, no ventricular wall abnormalities. This morning EP notes that patient stable post procedure, in sinus rhythm and appropriate for discharge; EP follow up scheduled for four weeks. Patient is medically stable and reports resolution of his lightheadedness with walking.    Plan -ambulation to discharge    -continue flecainide, metoprolol and Eliquis at home    LOS: 2 days   Mercy Moore, Medical Student 03/25/2019, 12:35 PM

## 2019-03-25 NOTE — Progress Notes (Addendum)
Progress Note  Patient Name: Juan Calderon Date of Encounter: 03/25/2019  Primary Cardiologist: Lauree Chandler, MD   Subjective   Feels well,  No SOB, no CP, no procedure site discomfort, reports he has been up and ambulated without difficulty  Inpatient Medications    Scheduled Meds: . allopurinol  300 mg Oral Daily  . apixaban  5 mg Oral BID  . atorvastatin  20 mg Oral Daily  . flecainide  100 mg Oral Q12H  . insulin aspart  0-15 Units Subcutaneous TID WC  . metoprolol tartrate  12.5 mg Oral BID  . pantoprazole  40 mg Oral Daily  . sodium chloride flush  3 mL Intravenous Q12H  . terazosin  5 mg Oral QHS  . torsemide  20 mg Oral 3 times weekly   Continuous Infusions: . sodium chloride     PRN Meds: sodium chloride, acetaminophen **OR** acetaminophen, albuterol, nitroGLYCERIN, ondansetron (ZOFRAN) IV, oxyCODONE-acetaminophen **AND** oxyCODONE, senna-docusate, sodium chloride flush   Vital Signs    Vitals:   03/24/19 1601 03/24/19 1632 03/24/19 1950 03/25/19 0523  BP: 120/80 (!) 106/95 (!) 141/76 131/72  Pulse: (!) 54 62 (!) 58 (!) 57  Resp:   18 18  Temp:   98.1 F (36.7 C) 97.6 F (36.4 C)  TempSrc:   Oral Oral  SpO2:   100% 91%  Weight:    (!) 140 kg  Height:        Intake/Output Summary (Last 24 hours) at 03/25/2019 0754 Last data filed at 03/25/2019 0748 Gross per 24 hour  Intake 360 ml  Output 475 ml  Net -115 ml   Last 3 Weights 03/25/2019 03/24/2019 03/23/2019  Weight (lbs) 308 lb 9.6 oz 309 lb 3.2 oz 311 lb  Weight (kg) 139.98 kg 140.252 kg 141.069 kg      Telemetry    SB 50's generally - Personally Reviewed  ECG    SB 57bpm, borderline 1st degree AVblock, PR 234ms, QRS 154ms, stable intervals - Personally Reviewed  Physical Exam   GEN: No acute distress.   Neck: No JVD Cardiac: RRR, no murmurs, rubs, or gallops.  Respiratory: CTA b/l. GI: Soft, nontender, non-distended, obese  MS: No edema; No deformity. Neuro:  Nonfocal   Psych: Normal affect   R groin is sft, non-tender, no bleeding or hematoma  Labs    High Sensitivity Troponin:   Recent Labs  Lab 03/23/19 1030 03/23/19 1830  TROPONINIHS 19* 21*      Cardiac EnzymesNo results for input(s): TROPONINI in the last 168 hours. No results for input(s): TROPIPOC in the last 168 hours.   Chemistry Recent Labs  Lab 03/23/19 1030 03/24/19 0424  NA 138 138  K 4.5 4.1  CL 102 105  CO2 27 26  GLUCOSE 155* 113*  BUN 14 13  CREATININE 1.43* 1.21  CALCIUM 9.1 9.0  PROT 6.3*  --   ALBUMIN 3.6  --   AST 23  --   ALT 30  --   ALKPHOS 63  --   BILITOT 0.7  --   GFRNONAA 51* >60  GFRAA 60* >60  ANIONGAP 9 7     Hematology Recent Labs  Lab 03/23/19 1030 03/24/19 0424  WBC 6.8 6.8  RBC 4.25 4.08*  HGB 13.5 12.8*  HCT 41.6 39.3  MCV 97.9 96.3  MCH 31.8 31.4  MCHC 32.5 32.6  RDW 13.3 13.3  PLT 164 160    BNPNo results for input(s): BNP, PROBNP in the last  168 hours.   DDimer No results for input(s): DDIMER in the last 168 hours.   Radiology    Dg Chest 2 View  Result Date: 03/23/2019 CLINICAL DATA:  Shortness of breath for 2-3 days, negative COVID-19 test EXAM: CHEST - 2 VIEW COMPARISON:  01/26/2017 FINDINGS: Cardiac shadow is enlarged but stable. Vascular congestion is noted slightly greater than that seen on the prior exam although no edema is seen. No effusion is noted. No bony abnormality is seen. IMPRESSION: Slight increase in vascular congestion without definitive edema. Electronically Signed   By: Inez Catalina M.D.   On: 03/23/2019 20:18    Cardiac Studies   03/24/2019: TTE IMPRESSIONS  1. The left ventricle has normal systolic function, with an ejection fraction of 55-60%. The cavity size was normal. There is moderately increased left ventricular wall thickness. Left ventricular diastolic function could not be evaluated secondary to  atrial flutter. No evidence of left ventricular regional wall motion abnormalities.  2. The  right ventricle has normal systolic function. The cavity was normal. There is no increase in right ventricular wall thickness.  3. The RV is not well visualized but visually RV function appears reduced.  4. Left atrial size was mild-moderately dilated.  5. The aortic valve was not well visualized.  6. The aortic root and ascending aorta are normal in size and structure.  FINDINGS  Left Ventricle: The left ventricle has normal systolic function, with an ejection fraction of 55-60%. The cavity size was normal. There is moderately increased left ventricular wall thickness. Left ventricular diastolic function could not be evaluated  secondary to atrial fibrillation. No evidence of left ventricular regional wall motion abnormalities..  Right Ventricle: The right ventricle has normal systolic function. The cavity was normal. There is no increase in right ventricular wall thickness. The RV is not well visualized but visually RV function appears reduced.  Left Atrium: Left atrial size was mild-moderately dilated.  Right Atrium: Right atrial size was normal in size. Right atrial pressure is estimated at 10 mmHg.  Interatrial Septum: No atrial level shunt detected by color flow Doppler.  Pericardium: There is no evidence of pericardial effusion.  Mitral Valve: The mitral valve is normal in structure. Mitral valve regurgitation is not visualized by color flow Doppler.  Tricuspid Valve: The tricuspid valve is normal in structure. Tricuspid valve regurgitation was not visualized by color flow Doppler.  Aortic Valve: The aortic valve was not well visualized Aortic valve regurgitation was not visualized by color flow Doppler.  Pulmonic Valve: The pulmonic valve was normal in structure. Pulmonic valve regurgitation is not visualized by color flow Doppler.  Aorta: The aortic root and ascending aorta are normal in size and structure.  Venous: The inferior vena cava is normal in size with  greater than 50% respiratory variability.  Patient Profile     63 y.o. male with a hx of chronic CHF (diastolic), CAD (non-obstructive by cath in 2007, COPD, HTN, HLD, gout, GERD, DM,  Chronic venous stasis (PMD manages), morbid obesity, OSA (untreated),  persistent AFib admitted with increasing SOB/SOE found in AFlutter felt to be the etiology of his symptoms (in conjunction with his morbid obesity, untreated OSA and p.HTN   AFib Hx: DCCV 2016 EXB:[id number], 2016 Hx of digoxin stopped with rates 30's and BB was reduced 2016 Intolerant of CPAP pending perhaps an oral device BB was stopped 05/2016 ? Bradycardia resumed in 2017 after a CP episode 03/2019: AFlutter ablated  Assessment & Plan    1.  Persistent AFib 2. New Aflutter w/CVR (typical)     CHA2DS2Vasc is 2, on Eliquis, appropriately dosed with no reports missed doses in >4 weeks     Continue Flecainide and metoprolol, has served him well  He is in SR this AM s/p EPS/ablation yesterday He feels much improved No site discomfort Procedure site is stable Post procedure activity instructions/restrictions were discussed with the patient Continue his metoprolol and Flecainide unchanged No interruption in his Eliquis post ablation for 4 weeks EP follow up is in place   3. DOE/SOB     Exam does not suggest avert fluid OL     Echo noted above     Likely his  flutter    Ok to discharge from our perspective   For questions or updates, please contact [place] Please consult [email or web address] for contact info under        Signed, Baldwin Jamaica, PA-C  03/25/2019, 7:54 AM    EP Attending  Patient seen and examined. Agree with the findings as above. He is maintaining NSR/SB. Ok for DC home with usual followup with Dr. Renaldo Reel in 4-6 weeks. Continue flecainide and beta blocker for atrial fib.   Mikle Bosworth.D.

## 2019-03-25 NOTE — Discharge Summary (Addendum)
Name: Juan Calderon MRN: [id number] DOB: 1956-03-01 63 y.o. PCP: Axel Filler, MD  Date of Admission: 03/23/2019 10:48 AM Date of Discharge: 03/25/2019 Attending Physician: Bartholomew Crews, MD  Discharge Diagnosis: 1. New Atrial flutter   Discharge Medications: Allergies as of 03/25/2019      Reactions   Ramipril Swelling   Facial swelling   Tape Other (See Comments)   Irritates skin/ pls use paper tape   Testosterone Rash      Medication List    TAKE these medications   albuterol 108 (90 Base) MCG/ACT inhaler Commonly known as: VENTOLIN HFA Inhale 1-2 puffs into the lungs every 6 (six) hours as needed for shortness of breath.   allopurinol 300 MG tablet Commonly known as: ZYLOPRIM Take 1 tablet (300 mg total) by mouth daily. Please note the change in the number of tablets to take daily.   atorvastatin 20 MG tablet Commonly known as: LIPITOR Take 1 tablet (20 mg total) by mouth daily.   Eliquis 5 MG Tabs tablet Generic drug: apixaban TAKE 1 TABLET BY MOUTH TWICE A DAY What changed: how much to take   esomeprazole 40 MG capsule Commonly known as: NEXIUM Take 1 capsule (40 mg total) by mouth daily.   flecainide 100 MG tablet Commonly known as: TAMBOCOR Take 1 tablet (100 mg total) by mouth every 12 (twelve) hours.   glucose blood test strip Commonly known as: OneTouch Verio Use as instructed   latanoprost 0.005 % ophthalmic solution Commonly known as: XALATAN Place 1 drop into both eyes at bedtime.   magnesium oxide 400 MG tablet Commonly known as: MAG-OX Take 2 tablets by mouth 2 (two) times daily.   metFORMIN 1000 MG tablet Commonly known as: GLUCOPHAGE Take 1 tablet (1,000 mg total) by mouth 2 (two) times daily with a meal.   metoprolol tartrate 25 MG tablet Commonly known as: LOPRESSOR TAKE 1 TABLET BY MOUTH TWICE A DAY   MUCINEX DM PO Take 10 mLs by mouth every 4 (four) hours as needed (cough).   nitroGLYCERIN 0.4 MG SL  tablet Commonly known as: NITROSTAT Place 1 tablet (0.4 mg total) under the tongue every 5 (five) minutes as needed for chest pain.   OneTouch Delica Lancets 32T Misc Use 1 strip daily   OneTouch Verio w/Device Kit 1 each by Does not apply route daily.   oxyCODONE-acetaminophen 10-325 MG tablet Commonly known as: Percocet Take 1 tablet by mouth every 6 (six) hours as needed for pain.   Sorbitol 70 % Soln Take 15-60 mLs by mouth daily as needed. What changed:   how much to take  reasons to take this   terazosin 5 MG capsule Commonly known as: HYTRIN Take 1 capsule (5 mg total) by mouth at bedtime.   torsemide 20 MG tablet Commonly known as: DEMADEX TAKE 2 TABLETS BY MOUTH 3 TIMES PER WEEK What changed:   how much to take  how to take this  when to take this  additional instructions       Disposition and follow-up:   [name] was discharged from [facility] in Good condition.  At the hospital follow up visit please address:  1.  Resolution of symptoms present on admission including chest pain, dyspnea and fatigue.   2.  Labs / imaging needed at time of follow-up: none  3.  Pending labs/ test needing follow-up: none  Follow-up Appointments: Follow-up Information    Baldwin Jamaica, PA-C Follow up.  Specialty: Cardiology Why: 04/27/2019 @ 10:15AM (for Dr. [name]) Contact information: [address] [place] [address] [phone]        Axel Filler, MD Follow up.   Specialty: Internal Medicine Why: Please follow up with Dr. Evette Doffing on July 27 at 8:45 AM. Contact information: [place] [address] [phone]        Burnell Blanks, MD Follow up on 06/25/2019.   Specialty: Cardiology Why: 11:00 am for follow up after ablation Contact information: [address] [place]  [address] [phone]           Hospital Course by problem list: 1.  Atrial flutter -admitted directly from the clinic with symptoms of exertional chest pain and dyspnea with EKG changes -troponin was unremarkable, no concern for ACS but cards consulted and found a new atrial flutter -cards recommended ablation with EP and continuation of metoprolol, flecainide and eliquis for known afib  -pt ablated with no arrhythmias or complications afterwards -discharged in sinus rhythm, with resolution of sx from admission, with continuation of home meds and EP fu  Discharge Vitals:   BP 128/73   Pulse 73   Temp 98 F (36.7 C) (Oral)   Resp 20   Ht 6' (1.829 m)   Wt (!) 140 kg Comment: scale a  SpO2 95%   BMI 41.85 kg/m   Pertinent Labs, Studies, and Procedures:  EKG on admission: Aflutter 71 bpm, no clear ischemic looking changes  EKG on discharge: SB 57 bmp, borderline 1st degree AV block, stable intervals  Echo: LVEF 55-60% Moderately increased LV wall thickness. No LV WMA. Left atria mild-moderately dilated.   EP Study: Isthmus-dependent right atrial flutter upon presentation. Successful radiofrequency ablation of atrial flutter along the cavotricuspid isthmus with complete bidirectional isthmus block achieved. No inducible arrhythmias following ablation. No early apparent complications.   Discharge Instructions: Discharge Instructions    Call MD for:  persistant dizziness or light-headedness   Complete by: As directed    Diet - low sodium heart healthy   Complete by: As directed    Diet Carb Modified   Complete by: As directed    Increase activity slowly   Complete by: As directed       Signed: Al Decant, MD 03/25/2019, 9:41 AM   Pager: [phone]

## 2019-03-26 ENCOUNTER — Telehealth: Payer: Self-pay | Admitting: Internal Medicine

## 2019-03-26 NOTE — Telephone Encounter (Signed)
New Message   Patient is calling in to see if he is able to remove the bandaging from his procedure on Tuesday 03/24/19. Please give patient a call back.

## 2019-03-26 NOTE — Telephone Encounter (Signed)
Spoke to pt regarding concerns, informed him that it is okay to remove occlusive dressing 24 hours after procedure.

## 2019-03-30 ENCOUNTER — Ambulatory Visit (HOSPITAL_COMMUNITY)
Admission: RE | Admit: 2019-03-30 | Discharge: 2019-03-30 | Disposition: A | Discharge status: Home / Self Care | Payer: Medicare Other | Source: Ambulatory Visit | Attending: Student in an Organized Health Care Education/Training Program | Admitting: Student in an Organized Health Care Education/Training Program

## 2019-03-30 ENCOUNTER — Encounter: Payer: Self-pay | Admitting: Student in an Organized Health Care Education/Training Program

## 2019-03-30 ENCOUNTER — Ambulatory Visit (INDEPENDENT_AMBULATORY_CARE_PROVIDER_SITE_OTHER): Payer: Medicare Other | Admitting: Student in an Organized Health Care Education/Training Program

## 2019-03-30 ENCOUNTER — Other Ambulatory Visit: Payer: Self-pay

## 2019-03-30 VITALS — BP 143/67 | HR 61 | Temp 97.9°F | Ht 69.5 in | Wt 314.1 lb

## 2019-03-30 DIAGNOSIS — I447 Left bundle-branch block, unspecified: Secondary | ICD-10-CM

## 2019-03-30 DIAGNOSIS — Z6841 Body Mass Index (BMI) 40.0 and over, adult: Secondary | ICD-10-CM | POA: Diagnosis not present

## 2019-03-30 DIAGNOSIS — M170 Bilateral primary osteoarthritis of knee: Secondary | ICD-10-CM

## 2019-03-30 DIAGNOSIS — I4892 Unspecified atrial flutter: Secondary | ICD-10-CM | POA: Insufficient documentation

## 2019-03-30 DIAGNOSIS — G8929 Other chronic pain: Secondary | ICD-10-CM

## 2019-03-30 DIAGNOSIS — Z7901 Long term (current) use of anticoagulants: Secondary | ICD-10-CM

## 2019-03-30 DIAGNOSIS — I440 Atrioventricular block, first degree: Secondary | ICD-10-CM | POA: Insufficient documentation

## 2019-03-30 DIAGNOSIS — E669 Obesity, unspecified: Secondary | ICD-10-CM | POA: Diagnosis not present

## 2019-03-30 DIAGNOSIS — I480 Paroxysmal atrial fibrillation: Secondary | ICD-10-CM

## 2019-03-30 DIAGNOSIS — R9431 Abnormal electrocardiogram [ECG] [EKG]: Secondary | ICD-10-CM | POA: Insufficient documentation

## 2019-03-30 DIAGNOSIS — Z79891 Long term (current) use of opiate analgesic: Secondary | ICD-10-CM | POA: Diagnosis not present

## 2019-03-30 DIAGNOSIS — G4733 Obstructive sleep apnea (adult) (pediatric): Secondary | ICD-10-CM

## 2019-03-30 MED ORDER — OXYCODONE-ACETAMINOPHEN 10-325 MG PO TABS: 1.0000 | ORAL_TABLET | Freq: Four times a day (QID) | ORAL | 0 refills | Status: DC | PRN

## 2019-03-30 NOTE — Assessment & Plan Note (Signed)
Patient with symptomatic atrial flutter, admitted to the hospital on 7/20 and underwent a flutter ablation procedure.  This seems to have been successful.  His symptoms are resolved.  EKG in office shows normal sinus rhythm.  We talked about sleep apnea and will work on controlling that better in the future.  He can follow-up with the A. fib clinic in about 1 month.  He is continuing anticoagulation with Eliquis 5 mg twice daily.

## 2019-03-30 NOTE — Assessment & Plan Note (Signed)
Patient with at least moderate sleep apnea diagnosed around 2014, however he has been unable to tolerate CPAP mask.  Says it did not stay on his face because of how he sleeps.  Not comfortable.  Not interested in trying it again.  2 years ago they tried to get him a mouthguard, went to the dentist to have his measurements taken.  However he did not follow through on this because of financial implications.  The necessity for some treatment of his OSA is now more important given his recent episode of atrial flutter.  He is going to follow back up with his dentist, likely will need new measurements, and try the oral device.
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
## 2019-03-30 NOTE — Patient Instructions (Signed)
Today we talked about your recent atrial flutter.  This was the heart rhythm that was making you feel tired and short of breath with walking.  You had a procedure in the hospital to get your heart back into normal rhythm.  An EKG in our office today shows a heart is still in that normal rhythm, which is good news.  Continue taking all your medications as prescribed.  We talked about sleep apnea today.  The sleep apnea if untreated can drive your heart into strange rhythms like atrial flutter or atrial fibrillation.  We talked about your history of difficulty wearing a CPAP mask.  We set up plan for you to follow-up with your dentist to have the oral mouthguard made to help with your sleep apnea.  I think this is more important now to try to prevent recurrent heart arrhythmia.  Follow-up with me as usual in 3 months.  I have referred you to orthopedics for your chronic knee pain.  I will continue to refill your pain medications.

## 2019-03-31 DIAGNOSIS — M170 Bilateral primary osteoarthritis of knee: Secondary | ICD-10-CM | POA: Diagnosis not present

## 2019-04-07 ENCOUNTER — Other Ambulatory Visit: Payer: Self-pay | Admitting: *Deleted

## 2019-04-07 DIAGNOSIS — M1A00X0 Idiopathic chronic gout, unspecified site, without tophus (tophi): Secondary | ICD-10-CM

## 2019-04-07 MED ORDER — ALLOPURINOL 300 MG PO TABS: 300.0000 mg | ORAL_TABLET | Freq: Every day | ORAL | 3 refills | Status: DC

## 2019-04-20 ENCOUNTER — Other Ambulatory Visit: Payer: Self-pay | Admitting: *Deleted

## 2019-04-20 DIAGNOSIS — I25119 Atherosclerotic heart disease of native coronary artery with unspecified angina pectoris: Secondary | ICD-10-CM

## 2019-04-20 MED ORDER — METOPROLOL TARTRATE 25 MG PO TABS: 25.0000 mg | ORAL_TABLET | Freq: Two times a day (BID) | ORAL | 3 refills | Status: DC

## 2019-04-20 NOTE — Progress Notes (Signed)
Cardiology Office Note Date:  04/20/2019  Patient ID:  Juan Calderon, Juan Calderon May 22, 1956, MRN [id number] PCP:  Axel Filler, MD  Cardiologist:  Dr. Angelena Form Electrophysiologist: Dr. [name]   Chief Complaint: post CTI ablation visit  History of Present Illness: JB DULWORTH is a 63 y.o. male with history of chronic CHF (Diastolic), CAD (non-obstructive by cath in 2007), persistent AFib, COPD, HTN, HLD, gout, GERD, DM, chronic venous stasis (PMD is managing), morbid obesity, OSA (untreated, trying to find an afordable therapy, dental appliance option).  Mr. Kracht last saw Dr. [name] in Oct 2019, at that time by symptoms felt to have been maintaining SR, was still working on finding a tolerable and affordable sleep apnea therapy, mentioned chronic SOB and edema.  Dietary indiscretions with increasing weight.  No changes were made to his tx.  Was in SR that visit with stable intervals. Virtual visit with Dr. Angelena Form with stable symptoms,  Noting the patient taking extra torsemide fairly frequently, mentions he develops renal insufficiency easily with increased rputine diuresis, no changes were made.  The patient was admitted to [facility] 03/24/2019 via his PMD office with a couple  Months of worsening SOB and DOE, PMD did an EKG with suspect Mobitz II AVblock, ST/T changes and referred to the ER.  Cardiology was consulted, EKG done at PMD noted AFlutter with variable conduction/CVR,  Not block and EP was asked to consider flutter ablation.  He underwent EPS/ablation 03/24/2019 with Dr.Taylor, maintained on his Metoprolol and flecainide for his h/o AFib.  He comes in today to f/u post ablation.  He has done well since his hospital stay.  Back to his baseline exertional capacity that is mainly limited by orthopedic pain LLE from an old injury.  He denies any groin/procedure site complications.  No pain, bleeding, swelling.   He denies any CP, no rest SOB, does not exercise or do much in the way of  physical work (2./2 his bad leg), denies any DOE or difficulty with his ADLs.  No dizzy spells, no near syncope or syncope.  No bleeding or signs of bleeding with his Eliquis, reports compliance. Denies nay need for any additional/PRN use of his torsemide   AFib Hx: DCCV 2016 AAD: Flecainide, 2016 (current) Hx of digoxin stopped with rates 30's and BB was reduced 2016 Intolerant of CPAP pending perhaps an oral device BB was stopped 05/2016 ? Bradycardia resumed in 2017 after a CP episode 03/24/2019 AFlutter ablation (CTI)  Past Medical History:  Diagnosis Date  . Alcohol abuse    . Bradycardia   . C6 radiculopathy 01/24/2016   Right upper extremity, mild to moderate electrically by EMG on 01/24/2016  . Cataract    Left eye  . Chronic diastolic heart failure ([place])     with mild left ventricular hypertrophy on Echo 02/2010  . Chronic obstructive pulmonary disease ([place])    . Chronic osteomyelitis of femur ([place]) 04/06/2016  . Chronic osteomyelitis of left femur ([place]) 11/22/2017   Left femur s/p prior trauma  . Chronic osteomyelitis of left femur ([place]) 11/22/2017   Brodie's abscess: left femur s/p prior trauma.  Underwent partial excision and curettage of left femoral osteomyelitis at [facility] 12/30/2017 with grossly purulent material encountered within the medullary canal of the left distal femur.  Cultures grew MSSA.  Post-operatively received 6 weeks of IV antibiotics through 02/10/2018.  CRP elevated at 60.3 at end of IV antibiotic course so continued on Keflex  . Chronic pain syndrome  Left arm and leg s/p traumatic injury   . Chronic renal insufficiency    . Coronary artery disease     25% LAD stenosis on cath 2007.  Stable angina.  . Diverticulosis    . Diverticulosis 11/12/2013  . Essential hypertension    . Frequent PVCs   . Gastroesophageal reflux disease    . Gout    . Hyperlipidemia LDL goal < 100    . Internal hemorrhoids without complication 37/06/6268  . Long-term current use of  opiate analgesic 09/07/2016  . Mild carpal tunnel syndrome of right wrist 01/24/2016   Mild degree electrically per EMG 01/24/2016   . Mild carpal tunnel syndrome of right wrist 01/24/2016   Mild degree electrically per EMG 01/24/2016   . Morbid obesity with BMI of 40.0-44.9, adult ([place])    . Normocytic anemia    . NSVT (nonsustained ventricular tachycardia) ([place])   . Obstructive sleep apnea     Moderate, AHI 29.8 per hour with moderately loud snoring and oxygen desaturation to a nadir of 79%. CPAP titration resulted in a prescription for 17 CWP.    [name] Open-angle glaucoma    . Osteoarthritis cervical spine    . Osteoarthritis of left knee 06/19/2013   Tricompartmental disease.  Treated with double hinged upright knee brace, steroid/xylocaine knee injections, and NSAIDs   . Osteoporosis 05/14/2017   s/p fracture of the right humerus from a fall at ground hight  . Persistent atrial fibrillation   . Right rotator cuff tear     Large full-thickness tear of the supraspinatus with mild retraction but no atrophy   . Right rotator cuff tear 04/25/2013   Large full-thickness tear of the supraspinatus with mild retraction but no atrophy    . Secondary male hypogonadism 02/07/2017   Likely secondary to chronic opioid use  . Secondary male hypogonadism 02/07/2017   Likely secondary to chronic opioid use  . Subclinical hypothyroidism    . Tubular adenoma of colon 11/22/2017   Specifics unknown.  Repeat colonoscopy 08/12/2018 with six 3-6 mm tubular adenomas removed endoscopically.  . 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 ([place])    . Vasomotor rhinitis 04/25/2013    Past Surgical History:  Procedure Laterality Date  . A-FLUTTER ABLATION N/A 03/24/2019   Procedure: A-FLUTTER ABLATION;  Surgeon: [name], MD;  Location: [facility];  Service: Cardiovascular;  Laterality: N/A;  . CARDIOVERSION N/A 12/30/2014   Procedure: CARDIOVERSION;  Surgeon: [name], MD;   Location: [facility] ENDOSCOPY;  Service: Cardiovascular;  Laterality: N/A;  . FRACTURE SURGERY Left 1980's   Elbow  . Left arm surgery    . Left leg surgery    . SHOULDER SURGERY     Right    Current Outpatient Medications  Medication Sig Dispense Refill  . albuterol (PROVENTIL HFA;VENTOLIN HFA) 108 (90 Base) MCG/ACT inhaler Inhale 1-2 puffs into the lungs every 6 (six) hours as needed for shortness of breath. 1 Inhaler 11  . allopurinol (ZYLOPRIM) 300 MG tablet Take 1 tablet (300 mg total) by mouth daily. Please note the change in the number of tablets to take daily. 90 tablet 3  . atorvastatin (LIPITOR) 20 MG tablet Take 1 tablet (20 mg total) by mouth daily. 90 tablet 3  . Blood Glucose Monitoring Suppl (ONETOUCH VERIO) w/Device KIT 1 each by Does not apply route daily. 1 kit 0  . ELIQUIS 5 MG TABS tablet TAKE 1 TABLET BY MOUTH TWICE A DAY (  Patient taking differently: Take 5 mg by mouth 2 (two) times daily. ) 60 tablet 9  . esomeprazole (NEXIUM) 40 MG capsule Take 1 capsule (40 mg total) by mouth daily. 90 capsule 3  . flecainide (TAMBOCOR) 100 MG tablet Take 1 tablet (100 mg total) by mouth every 12 (twelve) hours. 180 tablet 3  . glucose blood (ONETOUCH VERIO) test strip Use as instructed 100 each 12  . latanoprost (XALATAN) 0.005 % ophthalmic solution Place 1 drop into both eyes at bedtime. 7.5 mL 2  . magnesium oxide (MAG-OX) 400 MG tablet Take 2 tablets by mouth 2 (two) times daily.  3  . metFORMIN (GLUCOPHAGE) 1000 MG tablet Take 1 tablet (1,000 mg total) by mouth 2 (two) times daily with a meal. 180 tablet 3  . metoprolol tartrate (LOPRESSOR) 25 MG tablet TAKE 1 TABLET BY MOUTH TWICE A DAY (Patient taking differently: Take 25 mg by mouth 2 (two) times daily. ) 180 tablet 3  . nitroGLYCERIN (NITROSTAT) 0.4 MG SL tablet Place 1 tablet (0.4 mg total) under the tongue every 5 (five) minutes as needed for chest pain. 25 tablet 1  . ONETOUCH DELICA LANCETS 31S MISC Use 1 strip daily 100 each 5   . oxyCODONE-acetaminophen (PERCOCET) 10-325 MG tablet Take 1 tablet by mouth every 6 (six) hours as needed for pain. 100 tablet 0  . Sorbitol 70 % SOLN Take 15-60 mLs by mouth daily as needed. (Patient taking differently: Take 60 mLs by mouth daily as needed (constipation). ) 473 mL 11  . terazosin (HYTRIN) 5 MG capsule Take 1 capsule (5 mg total) by mouth at bedtime. 90 capsule 3  . torsemide (DEMADEX) 20 MG tablet TAKE 2 TABLETS BY MOUTH 3 TIMES PER WEEK (Patient taking differently: Take 40 mg by mouth every Monday, Wednesday, and Friday. ) 72 tablet 3   No current facility-administered medications for this visit.     Allergies:   Ramipril, Tape, and Testosterone   Social History:  The patient  reports that he has never smoked. He has never used smokeless tobacco. He reports current alcohol use of about 14.0 standard drinks of alcohol per week. He reports that he does not use drugs.   Family History:  The patient's family history includes Alzheimer's disease in his father; Early death in his brother; Heart failure in his brother and mother; Hypertension in his brother and sister; Osteoarthritis in his brother; Prostate cancer in his brother.  ROS:  Please see the history of present illness.  All other systems are reviewed and otherwise negative.   PHYSICAL EXAM:  VS:  There were no vitals taken for this visit. BMI: There is no height or weight on file to calculate BMI. Well nourished, well developed, in no acute distress  HEENT: normocephalic, atraumatic  Neck: no JVD, carotid bruits or masses Cardiac:  RRR; no significant murmurs, no rubs, or gallops Lungs:  CTA b/l, no wheezing, rhonchi or rales  Abd: soft, nontender MS: no deformity or atrophy Ext:   R groin is soft, nontender. no edema is noted, LLE notable with marked scarring 2/2 reconstructive surgery many years ago Skin: warm and dry, no rash Neuro:  No gross deficits appreciated Psych: euthymic mood, full affect   EKG:   Done today and reviewed by myself: SR66bpm, stable intervals, PR 255m, QRS 1029m QTc 43627m7/21/2020: EPS/Ablation CONCLUSIONS:  1. Isthmus-dependent right atrial flutter upon presentation.  2. Successful radiofrequency ablation of atrial flutter along the cavotricuspid isthmus with complete  bidirectional isthmus block achieved.  3. No inducible arrhythmias following ablation.  4. No early apparent complications.   03/24/2019: TTE IMPRESSIONS 1. The left ventricle has normal systolic function, with an ejection fraction of 55-60%. The cavity size was normal. There is moderately increased left ventricular wall thickness. Left ventricular diastolic function could not be evaluated secondary to  atrial flutter. No evidence of left ventricular regional wall motion abnormalities. 2. The right ventricle has normal systolic function. The cavity was normal. There is no increase in right ventricular wall thickness. 3. The RV is not well visualized but visually RV function appears reduced. 4. Left atrial size was mild-moderately dilated. 5. The aortic valve was not well visualized. 6. The aortic root and ascending aorta are normal in size and structure.   Stress myoview 11/25/14: Impression Exercise Capacity: Lexiscan with no exercise. BP Response: Normal blood pressure response. Clinical Symptoms: There is dyspnea and chest pressure ECG Impression: No significant ST segment change suggestive of ischemia. Comparison with Prior Nuclear Study: Compared to 05/10/12, no change. Overall Impression: Normal stress nuclear study. LV Ejection Fraction:Study not gated. . LV Wall Motion: Study not gated due to atrial fibrillation; there appears to be significant LVE.  Echo May 2016: Left ventricle: LVEF is approximately 55 to 60% The cavity size was normal. Wall thickness was increased in a pattern of mild LVH. - Pulmonary arteries: PA peak pressure: 39 mm Hg (S).  Recent Labs:  03/23/2019: ALT 30; Magnesium 1.8; TSH 3.974 03/24/2019: BUN 13; Creatinine, Ser 1.21; Hemoglobin 12.8; Platelets 160; Potassium 4.1; Sodium 138  03/23/2019: Cholesterol 95; HDL 48; LDL Cholesterol 23; Total CHOL/HDL Ratio 2.0; Triglycerides 119; VLDL 24   CrCl cannot be calculated (Patient's most recent lab result is older than the maximum 21 days allowed.).   Wt Readings from Last 3 Encounters:  03/30/19 (!) 314 lb 1.6 oz (142.5 kg)  03/25/19 (!) 308 lb 9.6 oz (140 kg)  03/23/19 (!) 319 lb 6.4 oz (144.9 kg)     Other studies reviewed: Additional studies/records reviewed today include: summarized above  ASSESSMENT AND PLAN:  1. Paroxysmal AFib 2. AFlutter s/p CTI ablation 03/24/2019     SR today     CHA2DS2Vasc is 2, on Eliquis, appropriately dosed     Flecainide/Metoprolol     EKG intervals today remain stable  3. Diastolic CHF     Exam is euvolemic, weight is down     He reports for years unable to lay flat, this unchanged, still working on OSA treatment options           4. HTN     Looks OK, no changes    Disposition: no changes today.  He sees Dr. Angelena Form in Oct.  EP service will see him in 39mo sooner if needed   Current medicines are reviewed at length with the patient today.  The patient did not have any concerns regarding medicines.  SHaywood Lasso PA-C 04/20/2019 3:33 PM     [place] [phone])  [phone](fax)

## 2019-04-20 NOTE — Telephone Encounter (Signed)
Next appt scheduled  10/26 with PCP. 

## 2019-04-27 ENCOUNTER — Encounter: Payer: Self-pay | Admitting: Physician Assistant

## 2019-04-27 ENCOUNTER — Other Ambulatory Visit: Payer: Self-pay

## 2019-04-27 ENCOUNTER — Ambulatory Visit (INDEPENDENT_AMBULATORY_CARE_PROVIDER_SITE_OTHER): Payer: Medicare Other | Admitting: Physician Assistant

## 2019-04-27 VITALS — BP 126/74 | HR 66 | Ht 69.5 in | Wt 312.0 lb

## 2019-04-27 DIAGNOSIS — I5032 Chronic diastolic (congestive) heart failure: Secondary | ICD-10-CM | POA: Diagnosis not present

## 2019-04-27 DIAGNOSIS — I10 Essential (primary) hypertension: Secondary | ICD-10-CM

## 2019-04-27 DIAGNOSIS — I483 Typical atrial flutter: Secondary | ICD-10-CM

## 2019-04-27 DIAGNOSIS — I480 Paroxysmal atrial fibrillation: Secondary | ICD-10-CM

## 2019-04-27 NOTE — Patient Instructions (Addendum)
Medication Instructions:  Your physician recommends that you continue on your current medications as directed. Please refer to the Current Medication list given to you today.  If you need a refill on your cardiac medications before your next appointment, please call your pharmacy.   Lab work: NONE ORDERED  TODAY   If you have labs (blood work) drawn today and your tests are completely normal, you will receive your results only by: [name] MyChart Message (if you have MyChart) OR . A paper copy in the mail If you have any lab test that is abnormal or we need to change your treatment, we will call you to review the results.  Testing/Procedures: NONE ORDERED  TODAY'  Follow-Up: At [facility], you and your health needs are our priority.  As part of our continuing mission to provide you with exceptional heart care, we have created designated Provider Care Teams.  These Care Teams include your primary Cardiologist (physician) and Advanced Practice Providers (APPs -  Physician Assistants and Nurse Practitioners) who all work together to provide you with the care you need, when you need it. You will need a follow up appointment in 6 months.  Please call our office 2 months in advance to schedule this appointment.  You may see [name], MD or one of the following Advanced Practice Providers on your designated Care Team:   Chanetta Marshall, NP . Tommye Standard, PA-C . Youlanda Roys PA-C   Any Other Special Instructions Will Be Listed Below (If Applicable).

## 2019-05-05 ENCOUNTER — Other Ambulatory Visit: Payer: Self-pay | Admitting: *Deleted

## 2019-05-05 DIAGNOSIS — I10 Essential (primary) hypertension: Secondary | ICD-10-CM

## 2019-05-06 MED ORDER — TERAZOSIN HCL 5 MG PO CAPS: 5.0000 mg | ORAL_CAPSULE | Freq: Every day | ORAL | 3 refills | Status: DC

## 2019-05-21 ENCOUNTER — Other Ambulatory Visit: Payer: Self-pay | Admitting: Student in an Organized Health Care Education/Training Program

## 2019-05-21 ENCOUNTER — Other Ambulatory Visit: Payer: Self-pay | Admitting: *Deleted

## 2019-05-21 DIAGNOSIS — I25119 Atherosclerotic heart disease of native coronary artery with unspecified angina pectoris: Secondary | ICD-10-CM

## 2019-05-21 MED ORDER — ATORVASTATIN CALCIUM 20 MG PO TABS: 20.0000 mg | ORAL_TABLET | Freq: Every day | ORAL | 3 refills | Status: DC

## 2019-05-21 MED ORDER — OXYCODONE-ACETAMINOPHEN 10-325 MG PO TABS: 1.0000 | ORAL_TABLET | Freq: Four times a day (QID) | ORAL | 0 refills | Status: DC | PRN

## 2019-06-24 NOTE — Progress Notes (Signed)
Date:  06/25/2019   ID:  Juan Calderon, DOB July 11, 1956, MRN [id number]  PCP:  Axel Filler, MD  Cardiologist:  Lauree Chandler, MD [place] Electrophysiologist:  [name], MD   Chief Complaint:   Chief Complaint  Patient presents with  . Follow-up    CAD    History of Present Illness:    63 yo male with history of chronic diastolic CHF, CAD, persistent atrial fibrillation, bradycardia, frequent PVCs, NSVT, COPD, HTN, HLD, gout, GERD, DM, chronic venous stasis, morbid obesity and chronic dyspnea who is here today for cardiac follow up. He had been followed by Dr. [name] At [facility] in [name], [place]. Echo June 2011 in [name], [place] with mild LVH, EF of 55%. mild LAE. Monitor April 2012 in [place], [place] showed NSR with episodes of sinus brady, rare PVCs, occasional PACs and brief episodes of atrial tachycardia. Per records he has been on Multaq in the past but did not like this medication so it was stopped. He had a stress myoview 2007 that showed reversible ischemia in the inferior wall. This led to a cardiac cath on 10/01/05 which showed 25% mid LAD stenosis per report but no other evidence of CAD.  Admitted October 2015 with volume overload and echo showed normal systolic function EF 48%. He diuresed down to his dry weight of 304 pounds. Metoprolol was decreased to 50 mg twice a day because of nocturnal bradycardia with rates in the 30s. Digoxin was discontinued. He was seen in March 2016 and had c/o dyspnea on exertion. Stress myoview 11/25/14 with no ischemia. 24 hour monitor showed atrial fib with bradycardia (rates as low as 40 bpm), frequent PVCs (9000 in 24 hours) and non-sustained VT. He was seen by Dr. [name] 12/23/14 and was started on Flecainide. He underwent DCCV on 12/30/14. Echo May 2016 with LVEF=55-60%. He tried CPAP but did not tolerate. He has been followed in our EP clinic by Dr. [name] over the past few years and has remained on  Flecainide and Eliquis.   He is here today for follow up. The patient denies any chest pain, dyspnea, palpitations, lower extremity edema, orthopnea, PND, dizziness, near syncope or syncope.     Past Medical History:  Diagnosis Date  . Alcohol abuse    . Bradycardia   . C6 radiculopathy 01/24/2016   Right upper extremity, mild to moderate electrically by EMG on 01/24/2016  . Cataract    Left eye  . Chronic diastolic heart failure ([place])     with mild left ventricular hypertrophy on Echo 02/2010  . Chronic obstructive pulmonary disease ([place])    . Chronic osteomyelitis of femur ([place]) 04/06/2016  . Chronic osteomyelitis of left femur ([place]) 11/22/2017   Left femur s/p prior trauma  . Chronic osteomyelitis of left femur ([place]) 11/22/2017   Brodie's abscess: left femur s/p prior trauma.  Underwent partial excision and curettage of left femoral osteomyelitis at [facility] 12/30/2017 with grossly purulent material encountered within the medullary canal of the left distal femur.  Cultures grew MSSA.  Post-operatively received 6 weeks of IV antibiotics through 02/10/2018.  CRP elevated at 60.3 at end of IV antibiotic course so continued on Keflex  . Chronic pain syndrome     Left arm and leg s/p traumatic injury   . Chronic renal insufficiency    . Coronary artery disease     25% LAD stenosis on cath 2007.  Stable angina.  . Diverticulosis    .  Diverticulosis 11/12/2013  . Essential hypertension    . Frequent PVCs   . Gastroesophageal reflux disease    . Gout    . Hyperlipidemia LDL goal < 100    . Internal hemorrhoids without complication 38/25/0539  . Long-term current use of opiate analgesic 09/07/2016  . Mild carpal tunnel syndrome of right wrist 01/24/2016   Mild degree electrically per EMG 01/24/2016   . Mild carpal tunnel syndrome of right wrist 01/24/2016   Mild degree electrically per EMG 01/24/2016   . Morbid obesity with BMI of 40.0-44.9, adult ([place])    . Normocytic anemia    . NSVT (nonsustained  ventricular tachycardia) ([place])   . Obstructive sleep apnea     Moderate, AHI 29.8 per hour with moderately loud snoring and oxygen desaturation to a nadir of 79%. CPAP titration resulted in a prescription for 17 CWP.    [name] Open-angle glaucoma    . Osteoarthritis cervical spine    . Osteoarthritis of left knee 06/19/2013   Tricompartmental disease.  Treated with double hinged upright knee brace, steroid/xylocaine knee injections, and NSAIDs   . Osteoporosis 05/14/2017   s/p fracture of the right humerus from a fall at ground hight  . Persistent atrial fibrillation ([place])   . Right rotator cuff tear     Large full-thickness tear of the supraspinatus with mild retraction but no atrophy   . Right rotator cuff tear 04/25/2013   Large full-thickness tear of the supraspinatus with mild retraction but no atrophy    . Secondary male hypogonadism 02/07/2017   Likely secondary to chronic opioid use  . Secondary male hypogonadism 02/07/2017   Likely secondary to chronic opioid use  . Subclinical hypothyroidism    . Tubular adenoma of colon 11/22/2017   Specifics unknown.  Repeat colonoscopy 08/12/2018 with six 3-6 mm tubular adenomas removed endoscopically.  . 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 ([place])    . Vasomotor rhinitis 04/25/2013   Past Surgical History:  Procedure Laterality Date  . A-FLUTTER ABLATION N/A 03/24/2019   Procedure: A-FLUTTER ABLATION;  Surgeon: [name], MD;  Location: [facility];  Service: Cardiovascular;  Laterality: N/A;  . CARDIOVERSION N/A 12/30/2014   Procedure: CARDIOVERSION;  Surgeon: [name], MD;  Location: [facility] ENDOSCOPY;  Service: Cardiovascular;  Laterality: N/A;  . FRACTURE SURGERY Left 1980's   Elbow  . Left arm surgery    . Left leg surgery    . SHOULDER SURGERY     Right     Current Meds  Medication Sig  . albuterol (PROVENTIL HFA;VENTOLIN HFA) 108 (90 Base) MCG/ACT inhaler Inhale 1-2 puffs into the lungs  every 6 (six) hours as needed for shortness of breath.  . allopurinol (ZYLOPRIM) 300 MG tablet Take 1 tablet (300 mg total) by mouth daily. Please note the change in the number of tablets to take daily.  [name] atorvastatin (LIPITOR) 20 MG tablet Take 1 tablet (20 mg total) by mouth daily.  . Blood Glucose Monitoring Suppl (ONETOUCH VERIO) w/Device KIT 1 each by Does not apply route daily.  [name] ELIQUIS 5 MG TABS tablet TAKE 1 TABLET BY MOUTH TWICE A DAY  . esomeprazole (NEXIUM) 40 MG capsule Take 1 capsule (40 mg total) by mouth daily.  . flecainide (TAMBOCOR) 100 MG tablet Take 1 tablet (100 mg total) by mouth every 12 (twelve) hours.  [name] glucose blood (ONETOUCH VERIO) test strip Use as instructed  . latanoprost (XALATAN) 0.005 % ophthalmic solution  Place 1 drop into both eyes at bedtime.  . magnesium oxide (MAG-OX) 400 MG tablet Take 2 tablets by mouth 2 (two) times daily.  . metFORMIN (GLUCOPHAGE) 1000 MG tablet Take 1 tablet (1,000 mg total) by mouth 2 (two) times daily with a meal.  . metoprolol tartrate (LOPRESSOR) 25 MG tablet Take 1 tablet (25 mg total) by mouth 2 (two) times daily.  . nitroGLYCERIN (NITROSTAT) 0.4 MG SL tablet Place 1 tablet (0.4 mg total) under the tongue every 5 (five) minutes as needed for chest pain.  Glory Rosebush DELICA LANCETS 36R MISC Use 1 strip daily  . oxyCODONE-acetaminophen (PERCOCET) 10-325 MG tablet Take 1 tablet by mouth every 6 (six) hours as needed for pain.  . Sorbitol 70 % SOLN Take 15-60 mLs by mouth daily as needed.  . terazosin (HYTRIN) 5 MG capsule Take 1 capsule (5 mg total) by mouth at bedtime.  . torsemide (DEMADEX) 20 MG tablet TAKE 2 TABLETS BY MOUTH 3 TIMES PER WEEK     Allergies:   Ramipril, Tape, and Testosterone   Social History   Tobacco Use  . Smoking status: Never Smoker  . Smokeless tobacco: Never Used  Substance Use Topics  . Alcohol use: Yes    Alcohol/week: 14.0 standard drinks    Types: 14 Cans of beer per week  . Drug use: No      Family Hx: The patient's family history includes Alzheimer's disease in his father; Early death in his brother; Heart failure in his brother and mother; Hypertension in his brother and sister; Osteoarthritis in his brother; Prostate cancer in his brother. There is no history of Heart attack, Stroke, Colon cancer, Esophageal cancer, Pancreatic cancer, Stomach cancer, or Liver disease.  ROS:   Please see the history of present illness.    All other systems reviewed and are negative.   Labs/Other Tests and Data Reviewed:    Recent Labs: 03/23/2019: ALT 30; Magnesium 1.8; TSH 3.974 03/24/2019: BUN 13; Creatinine, Ser 1.21; Hemoglobin 12.8; Platelets 160; Potassium 4.1; Sodium 138   Recent Lipid Panel Lab Results  Component Value Date/Time   CHOL 95 03/23/2019 10:30 AM   TRIG 119 03/23/2019 10:30 AM   HDL 48 03/23/2019 10:30 AM   CHOLHDL 2.0 03/23/2019 10:30 AM   LDLCALC 23 03/23/2019 10:30 AM    Wt Readings from Last 3 Encounters:  06/25/19 (!) 314 lb 1.9 oz (142.5 kg)  04/27/19 (!) 312 lb (141.5 kg)  03/30/19 (!) 314 lb 1.6 oz (142.5 kg)     Objective:    Vital Signs:  BP 104/64   Pulse (!) 58   Ht 5' 9.5" (1.765 m)   Wt (!) 314 lb 1.9 oz (142.5 kg)   SpO2 96%   BMI 45.72 kg/m     General: Well developed, well nourished, NAD  HEENT: OP clear, mucus membranes moist  SKIN: warm, dry. No rashes. Neuro: No focal deficits  Musculoskeletal: Muscle strength 5/5 all ext  Psychiatric: Mood and affect normal  Neck: No JVD, no carotid bruits, no thyromegaly, no lymphadenopathy.  Lungs:Clear bilaterally, no wheezes, rhonci, crackles Cardiovascular: Regular rate and rhythm. No murmurs, gallops or rubs. Abdomen:Soft. Bowel sounds present. Non-tender.  Extremities: No lower extremity edema. Pulses are 2 + in the bilateral DP/PT.  EKG is not performed today   ASSESSMENT & PLAN:     1. Chronic Diastolic CHF: Weight is stable. Continue torsemide.    2. Paroxysmal Atrial  Fibrillation: Appears to be in sinus today. Continue  metoprolol, flecainide and Eliquis.    3. Hypertension: BP is well controlled at home. No changes  4. CAD without angina: No chest pain. Continue statin and beta blocker. No ASA since he is also on Eliquis.   Medication Adjustments/Labs and Tests Ordered: Current medicines are reviewed at length with the patient today.  Concerns regarding medicines are outlined above.  Tests Ordered: No orders of the defined types were placed in this encounter.  Medication Changes: No orders of the defined types were placed in this encounter.   Disposition:  Follow up in 12 month(s)  Signed, Lauree Chandler, MD  06/25/2019 11:47 AM

## 2019-06-25 ENCOUNTER — Ambulatory Visit (INDEPENDENT_AMBULATORY_CARE_PROVIDER_SITE_OTHER): Payer: Medicare Other | Admitting: Cardiovascular Disease

## 2019-06-25 ENCOUNTER — Other Ambulatory Visit: Payer: Self-pay

## 2019-06-25 ENCOUNTER — Encounter: Payer: Self-pay | Admitting: Cardiovascular Disease

## 2019-06-25 VITALS — BP 104/64 | HR 58 | Ht 69.5 in | Wt 314.1 lb

## 2019-06-25 DIAGNOSIS — I2510 Atherosclerotic heart disease of native coronary artery without angina pectoris: Secondary | ICD-10-CM | POA: Diagnosis not present

## 2019-06-25 DIAGNOSIS — I480 Paroxysmal atrial fibrillation: Secondary | ICD-10-CM | POA: Diagnosis not present

## 2019-06-25 DIAGNOSIS — I10 Essential (primary) hypertension: Secondary | ICD-10-CM

## 2019-06-25 DIAGNOSIS — I5032 Chronic diastolic (congestive) heart failure: Secondary | ICD-10-CM

## 2019-06-25 NOTE — Patient Instructions (Signed)

## 2019-06-29 ENCOUNTER — Encounter: Payer: Self-pay | Admitting: *Deleted

## 2019-06-29 ENCOUNTER — Ambulatory Visit (INDEPENDENT_AMBULATORY_CARE_PROVIDER_SITE_OTHER): Payer: Medicare Other | Admitting: Student in an Organized Health Care Education/Training Program

## 2019-06-29 ENCOUNTER — Other Ambulatory Visit: Payer: Self-pay

## 2019-06-29 ENCOUNTER — Encounter: Payer: Self-pay | Admitting: Student in an Organized Health Care Education/Training Program

## 2019-06-29 VITALS — BP 138/68 | HR 56 | Temp 98.1°F | Wt 319.0 lb

## 2019-06-29 DIAGNOSIS — J449 Chronic obstructive pulmonary disease, unspecified: Secondary | ICD-10-CM | POA: Diagnosis not present

## 2019-06-29 DIAGNOSIS — M25562 Pain in left knee: Secondary | ICD-10-CM | POA: Diagnosis not present

## 2019-06-29 DIAGNOSIS — Z79899 Other long term (current) drug therapy: Secondary | ICD-10-CM

## 2019-06-29 DIAGNOSIS — Z79891 Long term (current) use of opiate analgesic: Secondary | ICD-10-CM

## 2019-06-29 DIAGNOSIS — G4733 Obstructive sleep apnea (adult) (pediatric): Secondary | ICD-10-CM

## 2019-06-29 DIAGNOSIS — Z6841 Body Mass Index (BMI) 40.0 and over, adult: Secondary | ICD-10-CM

## 2019-06-29 DIAGNOSIS — J410 Simple chronic bronchitis: Secondary | ICD-10-CM

## 2019-06-29 DIAGNOSIS — Z23 Encounter for immunization: Secondary | ICD-10-CM | POA: Diagnosis not present

## 2019-06-29 DIAGNOSIS — I5030 Unspecified diastolic (congestive) heart failure: Secondary | ICD-10-CM

## 2019-06-29 DIAGNOSIS — G8929 Other chronic pain: Secondary | ICD-10-CM | POA: Diagnosis not present

## 2019-06-29 DIAGNOSIS — J4489 Other specified chronic obstructive pulmonary disease: Secondary | ICD-10-CM

## 2019-06-29 DIAGNOSIS — Z9119 Patient's noncompliance with other medical treatment and regimen: Secondary | ICD-10-CM

## 2019-06-29 MED ORDER — OXYCODONE-ACETAMINOPHEN 10-325 MG PO TABS: 1.0000 | ORAL_TABLET | Freq: Four times a day (QID) | ORAL | 0 refills | Status: DC | PRN

## 2019-06-29 MED ORDER — SPIRIVA HANDIHALER 18 MCG IN CAPS: 18.0000 ug | ORAL_CAPSULE | Freq: Every day | RESPIRATORY_TRACT | 5 refills | Status: DC

## 2019-06-29 MED ORDER — ALBUTEROL SULFATE HFA 108 (90 BASE) MCG/ACT IN AERS: 1.0000 | INHALATION_SPRAY | Freq: Four times a day (QID) | RESPIRATORY_TRACT | 5 refills | Status: DC | PRN

## 2019-06-29 NOTE — Assessment & Plan Note (Signed)
Patient with known chronic obstructive pulmonary disease, last PFTs in 2014 showed a ratio of 69, FEV1 of 1.7 L which is 55% predicted.  He also had a reduction in his DLCO at the time.  He is essentially a never smoker.  Differential does include primary COPD, chronic bronchitis, and asthmatic bronchitis.  He is having significant symptoms of dyspnea on exertion, can walk only about 200 feet before he has to stop to rest.  He finds this very limiting to his functioning.  Is used albuterol in the past with some benefit, currently out of this medicine.  I think it is also likely that his other comorbidities are playing a role including his morbid obesity, untreated sleep apnea, heart failure with preserved ejection fraction.  I was hopeful that treating the atrial flutter with ablation and cardioversion with have helped his symptoms, but it only improved this modestly.  Plan is to start Spiriva once daily for the obstructive symptoms.  I hope this can improve his functional status.  Also continue with albuterol as needed.

## 2019-06-29 NOTE — Progress Notes (Signed)
   Assessment and Plan:  See Encounters tab for problem-based medical decision making.   __________________________________________________________  HPI:   63 year old man here for follow-up of chronic knee pain and shortness of breath.  He reports a stable functional status over the last few months.  Says is doing okay.  He lives independently and completes his own ADLs.  He still struggles with dyspnea on exertion, can walk about 200 feet before he has to rest.  He had to stop to rest one time while walking to our clinic from the parking lot.  This is improved from when I saw him earlier this year and he was in atrial flutter.  However he still describes his problem with breathing is being his most limiting factor.  Previously has been treated with albuterol as needed for the shortness of breath which she has found benefit with.  He denies any chest pain with exertion.  He denies any productive cough.  No recent fevers or chills.  He denies any significant history of smoking tobacco.  Says that he is bothered by some spell of chemicals like cleaning supplies, but does not otherwise get seasonal allergies.  __________________________________________________________  Problem List: Patient Active Problem List   Diagnosis Date Noted  . Chronic use of opiate drug for therapeutic purpose 08/23/2017    Priority: High  . Paroxysmal atrial fibrillation (HCC) 10/25/2015    Priority: High  . 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 ([place]) 04/25/2013    Priority: High  . Severe obesity with body mass index (BMI) of 35.0 to 39.9 with comorbidity ([place]) 04/25/2013    Priority: High  . Alcohol use disorder 02/28/2015    Priority: Medium  . Obstructive sleep apnea 06/01/2013    Priority: Medium  . Osteoarthritis cervical spine 04/25/2013    Priority: Medium  . Hyperlipidemia 04/25/2013    Priority: Medium  . Coronary artery disease involving native coronary artery with  angina pectoris ([place]) 04/25/2013    Priority: Medium  . COPD (chronic obstructive pulmonary disease) ([place]) 04/25/2013    Priority: Medium  . Chronic diastolic heart failure ([place]) 02/04/2012    Priority: Medium  . Essential hypertension 09/20/2011    Priority: Medium  . Tubular adenoma of colon 11/22/2017    Priority: Low  . C6 radiculopathy 01/24/2016    Priority: Low  . Constipation due to opioid therapy 12/24/2014    Priority: Low  . Post-traumatic osteoarthritis of left knee 06/19/2013    Priority: Low  . Gastroesophageal reflux disease without esophagitis 04/25/2013    Priority: Low  . Open-angle glaucoma 04/25/2013    Priority: Low  . Idiopathic chronic gout without tophus 04/25/2013    Priority: Low  . Healthcare maintenance 01/15/2013    Priority: Low    Medications: Reconciled today in Epic __________________________________________________________  Physical Exam:  Vital Signs: Vitals:   06/29/19 1042  BP: 138/68  Pulse: (!) 56  Temp: 98.1 F (36.7 C)  TempSrc: Oral  SpO2: 97%  Weight: (!) 319 lb (144.7 kg)    Gen: Well appearing, NAD Neck: No cervical LAD, No thyromegaly or nodules CV: RRR, no murmurs Pulm: Normal effort, CTA throughout, no wheezing Ext: Warm, 1+ pitting edema of the left leg, trace nonpitting edema in the right

## 2019-06-29 NOTE — Assessment & Plan Note (Signed)
Stable symptoms.  Unfortunately not able to tolerate CPAP, so currently nonadherent to these treatments.  I do wonder if this untreated sleep apnea is contributing to his dyspnea on exertion during the day.  We are going to continue to monitor for now as the usual treatments have already been tried.

## 2019-06-29 NOTE — Assessment & Plan Note (Addendum)
Chronic pain generators are stable.  Doing fairly well.  Mostly limited by left knee pain, had a glucocorticoid injection a few months ago with some improvement.  We will continue with oxycodone 10 mg every 6 hours as needed, I sent another refill today.  He has had no adverse side effects from this medication.  I reviewed him in the database which was appropriate.

## 2019-06-29 NOTE — Progress Notes (Signed)

## 2019-06-29 NOTE — Progress Notes (Signed)
Things That May Be Affecting Your Health: X Alcohol  Hearing loss  Pain    Depression  Home Safety  Sexual Health   Diabetes  Lack of physical activity  Stress   Difficulty with daily activities  Loneliness  Tiredness   Drug use  Medicines  Tobacco use   Falls  Motor Vehicle Safety X Weight   Food choices  Oral Health  Other    YOUR PERSONALIZED HEALTH PLAN : 1. Schedule your next subsequent Medicare Wellness visit in one year 2. Attend all of your regular appointments to address your medical issues 3. Complete the preventative screenings and services   Annual Wellness Visit   Medicare Covered Preventative Screenings and [place] Men and Women Who How Often Need? Date of Last Service Action  Abdominal Aortic Aneurysm Adults with AAA risk factors Once     Alcohol Misuse and Counseling All Adults Screening once a year if no alcohol misuse. Counseling up to 4 face to face sessions. Yes    Bone Density Measurement  Adults at risk for osteoporosis Once every 2 yrs     Lipid Panel Z13.6 All adults without CV disease Once every 5 yrs     Colorectal Cancer   Stool sample or  Colonoscopy All adults 69 and older   Once every year  Every 10 years     Depression All Adults Once a year  Today   Diabetes Screening Blood glucose, post glucose load, or GTT Z13.1  All adults at risk  Pre-diabetics  Once per year  Twice per year     Diabetes  Self-Management Training All adults Diabetics 10 hrs first year; 2 hours subsequent years. Requires Copay     Glaucoma  Diabetics  Family history of glaucoma  African Americans 81 yrs +  Hispanic Americans 71 yrs + Annually - requires coppay     Hepatitis C Z72.89 or F19.20  High Risk for HCV  Born between 1945 and 1965  Annually  Once     HIV Z11.4 All adults based on risk  Annually btw ages 18 & 70 regardless of risk  Annually > 65 yrs if at increased risk     Lung Cancer Screening Asymptomatic adults  aged 56-77 with 30 pack yr history and current smoker OR quit within the last 15 yrs Annually Must have counseling and shared decision making documentation before first screen     Medical Nutrition Therapy Adults with   Diabetes  Renal disease  Kidney transplant within past 3 yrs 3 hours first year; 2 hours subsequent years     Obesity and Counseling All adults Screening once a year Counseling if BMI 30 or higher  Today   Tobacco Use Counseling Adults who use tobacco  Up to 8 visits in one year     Vaccines Z23  Hepatitis B  Influenza   Pneumonia  Adults   Once  Once every flu season  Two different vaccines separated by one year Yes  See if he needs Shingrix please  Next Annual Wellness Visit People with Medicare Every year  Today     Services & Screenings Women Who How Often Need  Date of Last Service Action  Mammogram  Z12.31 Women over 45 One baseline ages 25-39. Annually ager 40 yrs+     Pap tests All women Annually if high risk. Every 2 yrs for normal risk women     Screening for cervical cancer with   Pap (Z01.419  nl or Z01.411abnl) &  HPV Z11.51 Women aged 69 to 68 Once every 5 yrs     Screening pelvic and breast exams All women Annually if high risk. Every 2 yrs for normal risk women     Sexually Transmitted Diseases  Chlamydia  Gonorrhea  Syphilis All at risk adults Annually for non pregnant females at increased risk         [place] Men Who How Ofter Need  Date of Last Service Action  Prostate Cancer - DRE & PSA Men over 50 Annually.  DRE might require a copay.     Sexually Transmitted Diseases  Syphilis All at risk adults Annually for men at increased risk

## 2019-06-29 NOTE — Patient Instructions (Signed)
Is great seeing you today.  We talked about your shortness of breath and how it is affecting her life.  Based on your lung testing along 2014 think that you have some amount of COPD, in addition to the obstructive sleep apnea.  Your heart is also likely playing a role normal in your shortness of breath.  I have refilled the albuterol which you can use as you need for shortness of breath.  But because you are having symptoms every day I also added a medicine called Spiriva which I want you to use every morning once a day.  I am hopeful that this will help with your shortness of breath and improved functioning.

## 2019-07-01 ENCOUNTER — Ambulatory Visit (INDEPENDENT_AMBULATORY_CARE_PROVIDER_SITE_OTHER): Payer: Medicare Other | Admitting: Student in an Organized Health Care Education/Training Program

## 2019-07-01 ENCOUNTER — Other Ambulatory Visit: Payer: Self-pay

## 2019-07-01 ENCOUNTER — Encounter: Payer: Self-pay | Admitting: Student in an Organized Health Care Education/Training Program

## 2019-07-01 VITALS — Ht 72.0 in | Wt 309.0 lb

## 2019-07-01 DIAGNOSIS — Z0000 Encounter for general adult medical examination without abnormal findings: Secondary | ICD-10-CM | POA: Diagnosis not present

## 2019-07-01 NOTE — Progress Notes (Signed)
Internal Medicine Clinic Attending  Case discussed with Howell Rucks, RN and I agree with the plan from this wellness visit as documented in her note.

## 2019-07-01 NOTE — Progress Notes (Signed)
This AWV is being conducted by [place] only. The patient was located at home and I was located in [facility]. The patient's identity was confirmed using their DOB and current address. The patient or his/her legal guardian has consented to being evaluated through a telephone encounter and understands the associated risks (an examination cannot be done and the patient may need to come in for an appointment) / benefits (allows the patient to remain at home, decreasing exposure to coronavirus). I personally spent 41 minutes conducting the AWV.  Subjective:   Juan Calderon is a 63 y.o. male who presents for a Medicare Annual Wellness Visit.  The following items have been reviewed and updated today in the appropriate area in the EMR.   Health Risk Assessment  Height, weight, BMI, and BP Visual acuity if needed Depression screen Fall risk / safety level Advance directive discussion Medical and family history were reviewed and updated Updating list of other providers & suppliers Medication reconciliation, including over the counter medicines Cognitive screen Written screening schedule Risk Factor list Personalized health advice, risky behaviors, and treatment advice  Social History   Social History Narrative   Current Social History 07/01/2019        Patient lives with friends in a home which is 1 story. There are 4 steps with handrails up to the entrance the patient uses.       Patient's method of transportation is personal car.      The highest level of education was high school diploma.      The patient currently disabled.      Identified important Relationships are "My son and my sister"       Pets : None       Interests / Fun: "Watch TV"       Current Stressors: "Not being able to get around like I used to."       Religious / Personal Beliefs: [employer]       L. Cederick Broadnax, BSN, RN-BC             Objective:    Vitals: Ht 6' (1.829 m)   Wt (!) 309 lb (140.2 kg)    BMI 41.91 kg/m  Vitals are unable to obtained due to [id number] public health emergency  Activities of Daily Living In your present state of health, do you have any difficulty performing the following activities: 07/01/2019 06/29/2019  Hearing? N N  Vision? N N  Difficulty concentrating or making decisions? N N  Walking or climbing stairs? Y Y  Dressing or bathing? N N  Doing errands, shopping? N N  Some recent data might be hidden    Goals Goals    . Blood Pressure < 130/80    . Exercise 2-3x per week (5 min per time)     Seated and standing exercises with exercise band    . HEMOGLOBIN A1C < 7.0    . Weight < 294 lb (133.4 kg)     5% weight loss       Fall Risk Fall Risk  07/01/2019 06/29/2019 03/30/2019 03/23/2019 09/19/2018  Falls in the past year? 1 0 1 0 1  Comment - - - - -  Number falls in past yr: 1 - 0 0 1  Injury with Fall? 1 - - - 1  Comment - - - - -  Risk Factor Category  - - - - -  Risk for fall due to : Impaired balance/gait;Impaired mobility;History of fall(s) - - -  History of fall(s)  Risk for fall due to: Comment - - - - -  Follow up Education provided;Falls prevention discussed Falls evaluation completed - - Falls prevention discussed   CDC Handout on Fall Prevention and Handout on Home Exercise Program, Access codes [id number] and [id number] mailed to patient. He has his own exercise bands.   Depression Screen PHQ 2/9 Scores 07/01/2019 09/19/2018 07/18/2018 06/13/2018  PHQ - 2 Score 2 2 1 1   PHQ- 9 Score 5 7 7 7      Cognitive Testing Six-Item Cognitive Screener   "I would like to ask you some questions that ask you to use your memory. I am going to name three objects. Please wait until I say all three words, then repeat them. Remember what they are  because I am going to ask you to name them again in a few minutes. Please repeat these words for me: APPLE-TABLE-PENNY." (Interviewer may repeat names 3 times if necessary but repetition not scored.)  Did  patient correctly repeat all three words? Yes - may proceed with screen  What year is this? Correct What month is this? Correct What day of the week is this? Correct  What were the three objects I asked you to remember? . Apple Correct . Table Unable to state . Penny Correct  Score one point for each incorrect answer.  A score of 2 or more points warrants additional investigation.  Patient's score 1   Assessment and Plan:     Patient made a 5% weight loss goal which will put him below 294 lbs. He will begin seated and standing exercises with exercise band 2-3 days per week 5 minutes per time He will ask Dr. Katy Fitch to send [place] his most recent eye exam. He will consider receiving the Shingrix vaccine at his local pharmacy.   During the course of the visit the patient was educated and counseled about appropriate screening and preventive services as documented in the assessment and plan.  The printed AVS was given to the patient and included an updated screening schedule, a list of risk factors, and personalized health advice.        Velora Heckler, RN  07/01/2019

## 2019-07-01 NOTE — Patient Instructions (Addendum)
Things That May Be Affecting Your Health: X Alcohol  Hearing loss  Pain    Depression  Home Safety  Sexual Health   Diabetes  Lack of physical activity  Stress   Difficulty with daily activities  Loneliness  Tiredness   Drug use  Medicines  Tobacco use   Falls  Motor Vehicle Safety X Weight   Food choices  Oral Health  Other    YOUR PERSONALIZED HEALTH PLAN : 1. Schedule your next subsequent Medicare Wellness visit in one year 2. Attend all of your regular appointments to address your medical issues 3. Complete the preventative screenings and services 4. Congratulations on your 5% weight loss goal!! This will put you below 294 lbs. 5. Begin seated and standing exercises with exercise band. 6. Please ask Dr. Katy Fitch to send [place] your most recent eye exam. 7. Consider receiving the Shingrix vaccine at your local pharmacy to help prevent shingles   Annual Wellness Visit                       Medicare Covered Preventative Screenings and [place] Men and Women Who How Often Need? Date of Last Service Action  Abdominal Aortic Aneurysm Adults with AAA risk factors Once     Alcohol Misuse and Counseling All Adults Screening once a year if no alcohol misuse. Counseling up to 4 face to face sessions. Yes    Bone Density Measurement  Adults at risk for osteoporosis Once every 2 yrs     Lipid Panel Z13.6 All adults without CV disease Once every 5 yrs     Colorectal Cancer   Stool sample or  Colonoscopy All adults 37 and older   Once every year  Every 10 years     Depression All Adults Once a year  Today   Diabetes Screening Blood glucose, post glucose load, or GTT Z13.1  All adults at risk  Pre-diabetics  Once per year  Twice per year     Diabetes  Self-Management Training All adults Diabetics 10 hrs first year; 2 hours subsequent years. Requires Copay     Glaucoma  Diabetics  Family history of glaucoma   African Americans 58 yrs +  Hispanic Americans 73 yrs + Annually - requires coppay     Hepatitis C Z72.89 or F19.20  High Risk for HCV  Born between 1945 and 1965  Annually  Once     HIV Z11.4 All adults based on risk  Annually btw ages 75 & 78 regardless of risk  Annually > 65 yrs if at increased risk     Lung Cancer Screening Asymptomatic adults aged 45-77 with 30 pack yr history and current smoker OR quit within the last 15 yrs Annually Must have counseling and shared decision making documentation before first screen     Medical Nutrition Therapy Adults with   Diabetes  Renal disease  Kidney transplant within past 3 yrs 3 hours first year; 2 hours subsequent years     Obesity and Counseling All adults Screening once a year Counseling if BMI 30 or higher  Today   Tobacco Use Counseling Adults who use tobacco  Up to 8 visits in one year     Vaccines Z23  Hepatitis B  Influenza   Pneumonia  Adults   Once  Once every flu season  Two different vaccines separated by one year Yes  See if he needs Shingrix please  Next Annual Wellness Visit People  with Medicare Every year  Today     [place] Women Who How Often Need  Date of Last Service Action  Mammogram  Z12.31 Women over 9 One baseline ages 43-39. Annually ager 40 yrs+     Pap tests All women Annually if high risk. Every 2 yrs for normal risk women     Screening for cervical cancer with   Pap (Z01.419 nl or Z01.411abnl) &  HPV Z11.51 Women aged 25 to 75 Once every 5 yrs     Screening pelvic and breast exams All women Annually if high risk. Every 2 yrs for normal risk women     Sexually Transmitted Diseases  Chlamydia  Gonorrhea  Syphilis All at risk adults Annually for non pregnant females at increased risk         [place] Men Who How Ofter Need  Date of Last Service Action  Prostate Cancer - DRE & PSA Men over 50 Annually.   DRE might require a copay.     Sexually Transmitted Diseases  Syphilis All at risk adults Annually for men at increased risk         Diabetes Mellitus and [place] care is an important part of your health, especially when you have diabetes. Diabetes may cause you to have problems because of poor blood flow (circulation) to your feet and legs, which can cause your skin to:  Become thinner and drier.  Break more easily.  Heal more slowly.  Peel and crack. You may also have nerve damage (neuropathy) in your legs and feet, causing decreased feeling in them. This means that you may not notice minor injuries to your feet that could lead to more serious problems. Noticing and addressing any potential problems early is the best way to prevent future foot problems. How to care for your feet Foot hygiene  Wash your feet daily with warm water and mild soap. Do not use hot water. Then, pat your feet and the areas between your toes until they are completely dry. Do not soak your feet as this can dry your skin.  Trim your toenails straight across. Do not dig under them or around the cuticle. File the edges of your nails with an emery board or nail file.  Apply a moisturizing lotion or petroleum jelly to the skin on your feet and to dry, brittle toenails. Use lotion that does not contain alcohol and is unscented. Do not apply lotion between your toes. Shoes and socks  Wear clean socks or stockings every day. Make sure they are not too tight. Do not wear knee-high stockings since they may decrease blood flow to your legs.  Wear shoes that fit properly and have enough cushioning. Always look in your shoes before you put them on to be sure there are no objects inside.  To break in new shoes, wear them for just a few hours a day. This prevents injuries on your feet. Wounds, scrapes, corns, and calluses  Check your feet daily for blisters, cuts, bruises, sores, and redness. If you  cannot see the bottom of your feet, use a mirror or ask someone for help.  Do not cut corns or calluses or try to remove them with medicine.  If you find a minor scrape, cut, or break in the skin on your feet, keep it and the skin around it clean and dry. You may clean these areas with mild soap and water. Do not clean the area with peroxide,  alcohol, or iodine.  If you have a wound, scrape, corn, or callus on your foot, look at it several times a day to make sure it is healing and not infected. Check for: ? Redness, swelling, or pain. ? Fluid or blood. ? Warmth. ? Pus or a bad smell. General instructions  Do not cross your legs. This may decrease blood flow to your feet.  Do not use heating pads or hot water bottles on your feet. They may burn your skin. If you have lost feeling in your feet or legs, you may not know this is happening until it is too late.  Protect your feet from hot and cold by wearing shoes, such as at the beach or on hot pavement.  Schedule a complete foot exam at least once a year (annually) or more often if you have foot problems. If you have foot problems, report any cuts, sores, or bruises to your health care provider immediately. Contact a health care provider if:  You have a medical condition that increases your risk of infection and you have any cuts, sores, or bruises on your feet.  You have an injury that is not healing.  You have redness on your legs or feet.  You feel burning or tingling in your legs or feet.  You have pain or cramps in your legs and feet.  Your legs or feet are numb.  Your feet always feel cold.  You have pain around a toenail. Get help right away if:  You have a wound, scrape, corn, or callus on your foot and: ? You have pain, swelling, or redness that gets worse. ? You have fluid or blood coming from the wound, scrape, corn, or callus. ? Your wound, scrape, corn, or callus feels warm to the touch. ? You have pus or a bad  smell coming from the wound, scrape, corn, or callus. ? You have a fever. ? You have a red line going up your leg. Summary  Check your feet every day for cuts, sores, red spots, swelling, and blisters.  Moisturize feet and legs daily.  Wear shoes that fit properly and have enough cushioning.  If you have foot problems, report any cuts, sores, or bruises to your health care provider immediately.  Schedule a complete foot exam at least once a year (annually) or more often if you have foot problems. This information is not intended to replace advice given to you by your health care provider. Make sure you discuss any questions you have with your health care provider. Document Released: 08/17/2000 Document Revised: 10/02/2017 Document Reviewed: 09/21/2016 Elsevier Patient Education  2020 [place] in the Home, Adult Falls can cause injuries. They can happen to people of all ages. There are many things you can do to make your home safe and to help prevent falls. Ask for help when making these changes, if needed. What actions can I take to prevent falls? General Instructions  Use good lighting in all rooms. Replace any light bulbs that burn out.  Turn on the lights when you go into a dark area. Use night-lights.  Keep items that you use often in easy-to-reach places. Lower the shelves around your home if necessary.  Set up your furniture so you have a clear path. Avoid moving your furniture around.  Do not have throw rugs and other things on the floor that can make you trip.  Avoid walking on wet floors.  If any of your floors  are uneven, fix them.  Add color or contrast paint or tape to clearly mark and help you see: ? Any grab bars or handrails. ? First and last steps of stairways. ? Where the edge of each step is.  If you use a stepladder: ? Make sure that it is fully opened. Do not climb a closed stepladder. ? Make sure that both sides of the  stepladder are locked into place. ? Ask someone to hold the stepladder for you while you use it.  If there are any pets around you, be aware of where they are. What can I do in the bathroom?      Keep the floor dry. Clean up any water that spills onto the floor as soon as it happens.  Remove soap buildup in the tub or shower regularly.  Use non-skid mats or decals on the floor of the tub or shower.  Attach bath mats securely with double-sided, non-slip rug tape.  If you need to sit down in the shower, use a plastic, non-slip stool.  Install grab bars by the toilet and in the tub and shower. Do not use towel bars as grab bars. What can I do in the bedroom?  Make sure that you have a light by your bed that is easy to reach.  Do not use any sheets or blankets that are too big for your bed. They should not hang down onto the floor.  Have a firm chair that has side arms. You can use this for support while you get dressed. What can I do in the kitchen?  Clean up any spills right away.  If you need to reach something above you, use a strong step stool that has a grab bar.  Keep electrical cords out of the way.  Do not use floor polish or wax that makes floors slippery. If you must use wax, use non-skid floor wax. What can I do with my stairs?  Do not leave any items on the stairs.  Make sure that you have a light switch at the top of the stairs and the bottom of the stairs. If you do not have them, ask someone to add them for you.  Make sure that there are handrails on both sides of the stairs, and use them. Fix handrails that are broken or loose. Make sure that handrails are as long as the stairways.  Install non-slip stair treads on all stairs in your home.  Avoid having throw rugs at the top or bottom of the stairs. If you do have throw rugs, attach them to the floor with carpet tape.  Choose a carpet that does not hide the edge of the steps on the stairway.  Check any  carpeting to make sure that it is firmly attached to the stairs. Fix any carpet that is loose or worn. What can I do on the outside of my home?  Use bright outdoor lighting.  Regularly fix the edges of walkways and driveways and fix any cracks.  Remove anything that might make you trip as you walk through a door, such as a raised step or threshold.  Trim any bushes or trees on the path to your home.  Regularly check to see if handrails are loose or broken. Make sure that both sides of any steps have handrails.  Install guardrails along the edges of any raised decks and porches.  Clear walking paths of anything that might make someone trip, such as tools or rocks.  Have any leaves, snow, or ice cleared regularly.  Use sand or salt on walking paths during winter.  Clean up any spills in your garage right away. This includes grease or oil spills. What other actions can I take?  Wear shoes that: ? Have a low heel. Do not wear high heels. ? Have rubber bottoms. ? Are comfortable and fit you well. ? Are closed at the toe. Do not wear open-toe sandals.  Use tools that help you move around (mobility aids) if they are needed. These include: ? Canes. ? Walkers. ? Scooters. ? Crutches.  Review your medicines with your doctor. Some medicines can make you feel dizzy. This can increase your chance of falling. Ask your doctor what other things you can do to help prevent falls. Where to find more information  Centers for Disease Control and Prevention, STEADI: https://garcia.biz/  [employer] on Aging: BrainJudge.co.uk Contact a doctor if:  You are afraid of falling at home.  You feel weak, drowsy, or dizzy at home.  You fall at home. Summary  There are many simple things that you can do to make your home safe and to help prevent falls.  Ways to make your home safe include removing tripping hazards and installing grab bars in the bathroom.  Ask for help when  making these changes in your home. This information is not intended to replace advice given to you by your health care provider. Make sure you discuss any questions you have with your health care provider. Document Released: 06/16/2009 Document Revised: 12/11/2018 Document Reviewed: 04/04/2017 Elsevier Patient Education  2020 [place], Male Adopting a healthy lifestyle and getting preventive care are important in promoting health and wellness. Ask your health care provider about:  The right schedule for you to have regular tests and exams.  Things you can do on your own to prevent diseases and keep yourself healthy. What should I know about diet, weight, and exercise? Eat a healthy diet   Eat a diet that includes plenty of vegetables, fruits, low-fat dairy products, and lean protein.  Do not eat a lot of foods that are high in solid fats, added sugars, or sodium. Maintain a healthy weight Body mass index (BMI) is a measurement that can be used to identify possible weight problems. It estimates body fat based on height and weight. Your health care provider can help determine your BMI and help you achieve or maintain a healthy weight. Get regular exercise Get regular exercise. This is one of the most important things you can do for your health. Most adults should:  Exercise for at least 150 minutes each week. The exercise should increase your heart rate and make you sweat (moderate-intensity exercise).  Do strengthening exercises at least twice a week. This is in addition to the moderate-intensity exercise.  Spend less time sitting. Even light physical activity can be beneficial. Watch cholesterol and blood lipids Have your blood tested for lipids and cholesterol at 64 years of age, then have this test every 5 years. You may need to have your cholesterol levels checked more often if:  Your lipid or cholesterol levels are high.  You are older than 65 years  of age.  You are at high risk for heart disease. What should I know about cancer screening? Many types of cancers can be detected early and may often be prevented. Depending on your health history and family history, you may need to have cancer screening at various ages.  This may include screening for:  Colorectal cancer.  Prostate cancer.  Skin cancer.  Lung cancer. What should I know about heart disease, diabetes, and high blood pressure? Blood pressure and heart disease  High blood pressure causes heart disease and increases the risk of stroke. This is more likely to develop in people who have high blood pressure readings, are of African descent, or are overweight.  Talk with your health care provider about your target blood pressure readings.  Have your blood pressure checked: ? Every 3-5 years if you are 55-2 years of age. ? Every year if you are 56 years old or older.  If you are between the ages of 47 and 16 and are a current or former smoker, ask your health care provider if you should have a one-time screening for abdominal aortic aneurysm (AAA). Diabetes Have regular diabetes screenings. This checks your fasting blood sugar level. Have the screening done:  Once every three years after age 37 if you are at a normal weight and have a low risk for diabetes.  More often and at a younger age if you are overweight or have a high risk for diabetes. What should I know about preventing infection? Hepatitis B If you have a higher risk for hepatitis B, you should be screened for this virus. Talk with your health care provider to find out if you are at risk for hepatitis B infection. Hepatitis C Blood testing is recommended for:  Everyone born from [birth year] through 1967.  Anyone with known risk factors for hepatitis C. Sexually transmitted infections (STIs)  You should be screened each year for STIs, including gonorrhea and chlamydia, if: ? You are sexually active and are  younger than 65 years of age. ? You are older than 65 years of age and your health care provider tells you that you are at risk for this type of infection. ? Your sexual activity has changed since you were last screened, and you are at increased risk for chlamydia or gonorrhea. Ask your health care provider if you are at risk.  Ask your health care provider about whether you are at high risk for HIV. Your health care provider may recommend a prescription medicine to help prevent HIV infection. If you choose to take medicine to prevent HIV, you should first get tested for HIV. You should then be tested every 3 months for as long as you are taking the medicine. Follow these instructions at home: Lifestyle  Do not use any products that contain nicotine or tobacco, such as cigarettes, e-cigarettes, and chewing tobacco. If you need help quitting, ask your health care provider.  Do not use street drugs.  Do not share needles.  Ask your health care provider for help if you need support or information about quitting drugs. Alcohol use  Do not drink alcohol if your health care provider tells you not to drink.  If you drink alcohol: ? Limit how much you have to 0-2 drinks a day. ? Be aware of how much alcohol is in your drink. In the [place] one drink equals one 12 oz bottle of beer (355 mL), one 5 oz glass of wine (148 mL), or one 1 oz glass of hard liquor (44 mL). General instructions  Schedule regular health, dental, and eye exams.  Stay current with your vaccines.  Tell your health care provider if: ? You often feel depressed. ? You have ever been abused or do not feel safe at home. Summary  Adopting  a healthy lifestyle and getting preventive care are important in promoting health and wellness.  Follow your health care provider's instructions about healthy diet, exercising, and getting tested or screened for diseases.  Follow your health care provider's instructions on monitoring your  cholesterol and blood pressure. This information is not intended to replace advice given to you by your health care provider. Make sure you discuss any questions you have with your health care provider. Document Released: 02/16/2008 Document Revised: 08/13/2018 Document Reviewed: 08/13/2018 Elsevier Patient Education  2020 [employer].

## 2019-08-12 ENCOUNTER — Encounter: Payer: Self-pay | Admitting: Student in an Organized Health Care Education/Training Program

## 2019-08-12 ENCOUNTER — Other Ambulatory Visit: Payer: Self-pay | Admitting: Student in an Organized Health Care Education/Training Program

## 2019-08-12 MED ORDER — OXYCODONE-ACETAMINOPHEN 10-325 MG PO TABS: 1.0000 | ORAL_TABLET | Freq: Four times a day (QID) | ORAL | 0 refills | Status: DC | PRN

## 2019-08-12 MED ORDER — PANTOPRAZOLE SODIUM 40 MG PO TBEC: 40.0000 mg | DELAYED_RELEASE_TABLET | Freq: Every day | ORAL | 2 refills | Status: DC

## 2019-08-17 DIAGNOSIS — H40013 Open angle with borderline findings, low risk, bilateral: Secondary | ICD-10-CM | POA: Diagnosis not present

## 2019-08-17 DIAGNOSIS — Z961 Presence of intraocular lens: Secondary | ICD-10-CM | POA: Diagnosis not present

## 2019-08-17 DIAGNOSIS — E119 Type 2 diabetes mellitus without complications: Secondary | ICD-10-CM | POA: Diagnosis not present

## 2019-08-17 DIAGNOSIS — H04123 Dry eye syndrome of bilateral lacrimal glands: Secondary | ICD-10-CM | POA: Diagnosis not present

## 2019-08-17 LAB — HM DIABETES EYE EXAM

## 2019-08-18 ENCOUNTER — Encounter: Payer: Self-pay | Admitting: *Deleted

## 2019-09-03 ENCOUNTER — Other Ambulatory Visit: Payer: Self-pay | Admitting: Student in an Organized Health Care Education/Training Program

## 2019-09-08 ENCOUNTER — Other Ambulatory Visit: Payer: Self-pay | Admitting: Student in an Organized Health Care Education/Training Program

## 2019-09-10 ENCOUNTER — Encounter: Payer: Self-pay | Admitting: *Deleted

## 2019-09-12 ENCOUNTER — Other Ambulatory Visit: Payer: Self-pay | Admitting: Student in an Organized Health Care Education/Training Program

## 2019-09-14 MED ORDER — OXYCODONE-ACETAMINOPHEN 10-325 MG PO TABS: 1.0000 | ORAL_TABLET | Freq: Four times a day (QID) | ORAL | 0 refills | Status: DC | PRN

## 2019-09-14 NOTE — Telephone Encounter (Signed)
Last rx written 08/12/19. Last OV 07/01/19. Next OV 12/07/19. UDS 08/23/17.

## 2019-10-01 ENCOUNTER — Other Ambulatory Visit: Payer: Self-pay | Admitting: Student in an Organized Health Care Education/Training Program

## 2019-10-12 ENCOUNTER — Ambulatory Visit: Payer: Medicare Other | Admitting: Student in an Organized Health Care Education/Training Program

## 2019-10-13 ENCOUNTER — Encounter: Payer: Self-pay | Admitting: Student in an Organized Health Care Education/Training Program

## 2019-10-13 ENCOUNTER — Other Ambulatory Visit: Payer: Self-pay | Admitting: Student in an Organized Health Care Education/Training Program

## 2019-10-13 MED ORDER — OXYCODONE-ACETAMINOPHEN 10-325 MG PO TABS: 1.0000 | ORAL_TABLET | Freq: Four times a day (QID) | ORAL | 0 refills | Status: DC | PRN

## 2019-10-16 ENCOUNTER — Other Ambulatory Visit: Payer: Self-pay | Admitting: *Deleted

## 2019-10-16 DIAGNOSIS — I480 Paroxysmal atrial fibrillation: Secondary | ICD-10-CM

## 2019-10-16 DIAGNOSIS — E1140 Type 2 diabetes mellitus with diabetic neuropathy, unspecified: Secondary | ICD-10-CM

## 2019-10-16 MED ORDER — METFORMIN HCL 1000 MG PO TABS: 1000.0000 mg | ORAL_TABLET | Freq: Two times a day (BID) | ORAL | 3 refills | Status: DC

## 2019-10-16 MED ORDER — FLECAINIDE ACETATE 100 MG PO TABS: 100.0000 mg | ORAL_TABLET | Freq: Two times a day (BID) | ORAL | 3 refills | Status: DC

## 2019-11-09 DIAGNOSIS — H04123 Dry eye syndrome of bilateral lacrimal glands: Secondary | ICD-10-CM | POA: Diagnosis not present

## 2019-11-09 DIAGNOSIS — H1045 Other chronic allergic conjunctivitis: Secondary | ICD-10-CM | POA: Diagnosis not present

## 2019-11-10 ENCOUNTER — Other Ambulatory Visit: Payer: Self-pay | Admitting: Student in an Organized Health Care Education/Training Program

## 2019-11-10 MED ORDER — OXYCODONE-ACETAMINOPHEN 10-325 MG PO TABS: 1.0000 | ORAL_TABLET | Freq: Four times a day (QID) | ORAL | 0 refills | Status: DC | PRN

## 2019-11-10 NOTE — Telephone Encounter (Signed)
Need refill on oxyCODONE-acetaminophen (PERCOCET) 10-325 MG tablet   [id number] - [place], [place] - [place]

## 2019-11-10 NOTE — Telephone Encounter (Signed)
Last rx written 10/13/19. Last OV 07/01/19. Next OV 12/07/19. UDS 08/23/17.

## 2019-12-07 ENCOUNTER — Ambulatory Visit (INDEPENDENT_AMBULATORY_CARE_PROVIDER_SITE_OTHER): Payer: Medicare Other | Admitting: Student in an Organized Health Care Education/Training Program

## 2019-12-07 ENCOUNTER — Encounter: Payer: Self-pay | Admitting: Student in an Organized Health Care Education/Training Program

## 2019-12-07 VITALS — BP 144/67 | HR 62 | Temp 98.2°F | Ht 72.0 in | Wt 320.4 lb

## 2019-12-07 DIAGNOSIS — R0600 Dyspnea, unspecified: Secondary | ICD-10-CM

## 2019-12-07 DIAGNOSIS — M1A00X0 Idiopathic chronic gout, unspecified site, without tophus (tophi): Secondary | ICD-10-CM | POA: Diagnosis not present

## 2019-12-07 DIAGNOSIS — N521 Erectile dysfunction due to diseases classified elsewhere: Secondary | ICD-10-CM

## 2019-12-07 DIAGNOSIS — Z79899 Other long term (current) drug therapy: Secondary | ICD-10-CM

## 2019-12-07 DIAGNOSIS — R1310 Dysphagia, unspecified: Secondary | ICD-10-CM

## 2019-12-07 DIAGNOSIS — J449 Chronic obstructive pulmonary disease, unspecified: Secondary | ICD-10-CM | POA: Diagnosis not present

## 2019-12-07 DIAGNOSIS — J410 Simple chronic bronchitis: Secondary | ICD-10-CM

## 2019-12-07 DIAGNOSIS — K219 Gastro-esophageal reflux disease without esophagitis: Secondary | ICD-10-CM | POA: Diagnosis not present

## 2019-12-07 DIAGNOSIS — Z79891 Long term (current) use of opiate analgesic: Secondary | ICD-10-CM | POA: Diagnosis not present

## 2019-12-07 DIAGNOSIS — Z7951 Long term (current) use of inhaled steroids: Secondary | ICD-10-CM

## 2019-12-07 DIAGNOSIS — G8929 Other chronic pain: Secondary | ICD-10-CM

## 2019-12-07 DIAGNOSIS — M159 Polyosteoarthritis, unspecified: Secondary | ICD-10-CM

## 2019-12-07 DIAGNOSIS — E1140 Type 2 diabetes mellitus with diabetic neuropathy, unspecified: Secondary | ICD-10-CM

## 2019-12-07 DIAGNOSIS — Z7984 Long term (current) use of oral hypoglycemic drugs: Secondary | ICD-10-CM

## 2019-12-07 DIAGNOSIS — R6881 Early satiety: Secondary | ICD-10-CM

## 2019-12-07 LAB — POCT GLYCOSYLATED HEMOGLOBIN (HGB A1C): Hemoglobin A1C: 6.9 % — AB (ref 4.0–5.6)

## 2019-12-07 LAB — GLUCOSE, CAPILLARY: Glucose-Capillary: 167 mg/dL — ABNORMAL HIGH (ref 70–99)

## 2019-12-07 MED ORDER — OXYCODONE-ACETAMINOPHEN 10-325 MG PO TABS: 1.0000 | ORAL_TABLET | Freq: Four times a day (QID) | ORAL | 0 refills | Status: DC | PRN

## 2019-12-07 NOTE — Progress Notes (Signed)
Assessment and Plan:  See Encounters tab for problem-based medical decision making.   __________________________________________________________  HPI:   64 year old person here for follow-up of diabetes and dyspnea on exertion.  Patient reports being very limited by shortness of breath at this point.  He can walk about 60 feet or he has to rest.  He came to the clinic in a wheelchair today.  He goes grocery shopping just 1 time a month, avoids it other weeks because of his breathing difficulties.  Denies any significant cough.  This has been progressively getting worse.  Denies any significant chest pain with exertion.  He had 1 self-limited episode of chest pain a couple days ago.  No recent fevers or chills or other illness.  We started Spiriva at our last visit because of his obstructive pulmonary function testing and he reports no benefit to that.  Denies orthopnea.  No other changes in his medications recently.  He lives on his own and is independent in his activities of daily living.  He does feel more disabled because of his breathing difficulties.    Also reporting some difficulties with swallowing food and water.  Feels like it gets stuck in the middle of his chest.  He does not regurgitate.  He drinks of warm water, and slowly over time feels like it is passed.  Sometimes he feels a sensation of swelling in his abdomen, and early satiety.  __________________________________________________________  Problem List: Patient Active Problem List   Diagnosis Date Noted  . Chronic use of opiate drug for therapeutic purpose 08/23/2017    Priority: High  . Paroxysmal atrial fibrillation (HCC) 10/25/2015    Priority: High  . 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 ([place]) 04/25/2013    Priority: High  . Severe obesity with body mass index (BMI) of 35.0 to 39.9 with comorbidity ([place]) 04/25/2013    Priority: High  . Alcohol use disorder 02/28/2015    Priority:  Medium  . Obstructive sleep apnea 06/01/2013    Priority: Medium  . Osteoarthritis cervical spine 04/25/2013    Priority: Medium  . Hyperlipidemia 04/25/2013    Priority: Medium  . Coronary artery disease involving native coronary artery with angina pectoris ([place]) 04/25/2013    Priority: Medium  . COPD (chronic obstructive pulmonary disease) ([place]) 04/25/2013    Priority: Medium  . Chronic diastolic heart failure ([place]) 02/04/2012    Priority: Medium  . Essential hypertension 09/20/2011    Priority: Medium  . Tubular adenoma of colon 11/22/2017    Priority: Low  . C6 radiculopathy 01/24/2016    Priority: Low  . Constipation due to opioid therapy 12/24/2014    Priority: Low  . Post-traumatic osteoarthritis of left knee 06/19/2013    Priority: Low  . Gastroesophageal reflux disease without esophagitis 04/25/2013    Priority: Low  . Open-angle glaucoma 04/25/2013    Priority: Low  . Idiopathic chronic gout without tophus 04/25/2013    Priority: Low  . Healthcare maintenance 01/15/2013    Priority: Low    Medications: Reconciled today in Epic __________________________________________________________  Physical Exam:  Vital Signs: Vitals:   12/07/19 0942  BP: (!) 144/67  Pulse: 62  Temp: 98.2 F (36.8 C)  TempSrc: Oral  SpO2: 94%  Weight: (!) 320 lb 6.4 oz (145.3 kg)  Height: 6' (1.829 m)    Gen: Well appearing, NAD ENT: OP clear without erythema or exudate.  Neck: No cervical LAD, No thyromegaly or nodules, No JVD. CV: RRR, no  murmurs Pulm: Normal effort, CTA throughout, no wheezing, no crackles Abd: Soft, NT, ND Ext: Warm, trace bilateral pitting edema

## 2019-12-07 NOTE — Assessment & Plan Note (Signed)
Patient with a history of gastroesophageal reflux disease treated with long-term PPI.  Last year he has had worsening obstructive symptoms, feels like food and water gets stuck in his mid chest.  He reports a upper endoscopy many years ago before coming to the [facility] system.  Does not sound like he is required esophageal dilation in the past.  Seems low risk for esophageal malignancy given timing.  Plan for referral to GI for upper endoscopy to rule out esophageal stricture, web, or malignancy.

## 2019-12-07 NOTE — Assessment & Plan Note (Addendum)
Patient with a history of gold stage II COPD PFTs were in 2014.  A little unusual as he is a never smoker.  Symptoms of dyspnea on exertion are increasing.  He was able to walk about 100 feet in our clinic and maintain his oxygen at 96% but was very dyspneic by the end.  I think this is becoming function limiting.  Exam is reassuring with no crackles, no signs of volume overload today.  I do anticipate this is probably multifactorial related to obstructive disease, obesity, heart failure.  Plan is to repeat pulmonary function testing to look for a change since the 2014 values.  If there is a change will refer to pulmonology.  For now we will continue with Spiriva 18 mcg daily, and torsemide 20 mg daily to keep him euvolemic.  Will check CBC to rule out anemia.

## 2019-12-07 NOTE — Assessment & Plan Note (Signed)
Hemoglobin A1c 6.9% today.  Under good control.  Plan to continue Metformin 1000 mg twice daily.

## 2019-12-07 NOTE — Assessment & Plan Note (Signed)
Stable.  Continue with allopurinol 3 mg daily.  Will check renal function and uric acid level today.

## 2019-12-07 NOTE — Assessment & Plan Note (Signed)
Stable chronic use of oxycodone 10 mg 4 times daily for chronic pain due to osteoarthritis of the low back as well as bilateral knees.  No adverse effects to oxycodone so far.  Plan to check a tox assure today.  I reviewed the database which was appropriate.  I refilled 30-day supply for Percocet today.

## 2019-12-08 LAB — CBC
Hematocrit: 39.6 % (ref 37.5–51.0)
Hemoglobin: 13.5 g/dL (ref 13.0–17.7)
MCH: 32.1 pg (ref 26.6–33.0)
MCHC: 34.1 g/dL (ref 31.5–35.7)
MCV: 94 fL (ref 79–97)
Platelets: 178 10*3/uL (ref 150–450)
RBC: 4.21 x10E6/uL (ref 4.14–5.80)
RDW: 14 % (ref 11.6–15.4)
WBC: 7.5 10*3/uL (ref 3.4–10.8)

## 2019-12-08 LAB — BMP8+ANION GAP
Anion Gap: 16 mmol/L (ref 10.0–18.0)
BUN/Creatinine Ratio: 17 (ref 10–24)
BUN: 19 mg/dL (ref 8–27)
CO2: 22 mmol/L (ref 20–29)
Calcium: 9.5 mg/dL (ref 8.6–10.2)
Chloride: 104 mmol/L (ref 96–106)
Creatinine, Ser: 1.14 mg/dL (ref 0.76–1.27)
GFR calc Af Amer: 78 mL/min/{1.73_m2} (ref >59)
GFR calc non Af Amer: 67 mL/min/{1.73_m2} (ref >59)
Glucose: 150 mg/dL — ABNORMAL HIGH (ref 65–99)
Potassium: 4.5 mmol/L (ref 3.5–5.2)
Sodium: 142 mmol/L (ref 134–144)

## 2019-12-08 LAB — URIC ACID: Uric Acid: 4 mg/dL (ref 3.8–8.4)

## 2019-12-09 LAB — TOXASSURE SELECT,+ANTIDEPR,UR

## 2019-12-09 NOTE — Addendum Note (Signed)
Addended by: Marcelino Duster on: 12/09/2019 04:38 PM   Modules accepted: Orders

## 2019-12-10 ENCOUNTER — Encounter: Payer: Self-pay | Admitting: Student in an Organized Health Care Education/Training Program

## 2019-12-10 ENCOUNTER — Telehealth: Payer: Self-pay | Admitting: *Deleted

## 2019-12-10 NOTE — Telephone Encounter (Signed)
COVID TESTING 04/13 @ [address] THRU TESTING ([id number]) PFT TESTING @ [facility] ARRIVE 12:45 (ADMITTING) FOR 1PM APPT  PT AWARE

## 2019-12-12 DIAGNOSIS — I4891 Unspecified atrial fibrillation: Secondary | ICD-10-CM | POA: Insufficient documentation

## 2019-12-14 ENCOUNTER — Ambulatory Visit (INDEPENDENT_AMBULATORY_CARE_PROVIDER_SITE_OTHER): Payer: Medicare Other | Admitting: Internal Medicine

## 2019-12-14 ENCOUNTER — Encounter: Payer: Self-pay | Admitting: Internal Medicine

## 2019-12-14 ENCOUNTER — Other Ambulatory Visit: Payer: Self-pay

## 2019-12-14 VITALS — BP 122/84 | HR 60 | Ht 72.0 in | Wt 317.0 lb

## 2019-12-14 DIAGNOSIS — I4819 Other persistent atrial fibrillation: Secondary | ICD-10-CM

## 2019-12-14 DIAGNOSIS — I5032 Chronic diastolic (congestive) heart failure: Secondary | ICD-10-CM

## 2019-12-14 NOTE — Progress Notes (Signed)
Patient Care Team: Axel Filler, MD as PCP - General (Internal Medicine) Burnell Blanks, MD as PCP - Cardiology (Cardiology) Deboraha Sprang, MD as PCP - Electrophysiology (Cardiology) Clent Jacks, MD as Consulting Physician (Ophthalmology) Irene Shipper, MD as Consulting Physician (Gastroenterology) Ninetta Lights, MD (Inactive) as Consulting Physician (Orthopedic Surgery)   HPI  Juan Calderon is a 64 y.o. male Seen in follow-up for atrial fibrillation acute hypomagnesemia  he is morbidly obese He was started on flecainide.   He has significant bradycardia associated with the use of digoxin beta-block   Anticoagulation w apixoban no bleeding  No atrial fibrillation of which he is aware   Hospitalized 4/19 with osteomyelitis of his leg; chronic left lower extremity edema.  No right-sided edema  No chest pain or palpitations.    DATE PR interval QRSduration Dose-flec  4/16     96       10/19 196 102 100  4/21 212 108 100      DATE TEST EF   1/07 LHC  Nonobstructive  5/16 Echo   55-60 % LA 48         Date Cr K Hgb  4/21 0.14 4.5 13.5            Thromboembolic risk factors (, HTN-1, DM-1) for a CHADSVASc Score of 2   Past Medical History:  Diagnosis Date  . Alcohol abuse    . Bradycardia   . C6 radiculopathy 01/24/2016   Right upper extremity, mild to moderate electrically by EMG on 01/24/2016  . Cataract    Left eye  . Chronic diastolic heart failure ([place])     with mild left ventricular hypertrophy on Echo 02/2010  . Chronic obstructive pulmonary disease ([place])    . Chronic osteomyelitis of femur ([place]) 04/06/2016  . Chronic osteomyelitis of left femur ([place]) 11/22/2017   Left femur s/p prior trauma  . Chronic osteomyelitis of left femur ([place]) 11/22/2017   Brodie's abscess: left femur s/p prior trauma.  Underwent partial excision and curettage of left femoral osteomyelitis at [facility] 12/30/2017 with grossly purulent material encountered  within the medullary canal of the left distal femur.  Cultures grew MSSA.  Post-operatively received 6 weeks of IV antibiotics through 02/10/2018.  CRP elevated at 60.3 at end of IV antibiotic course so continued on Keflex  . Chronic pain syndrome     Left arm and leg s/p traumatic injury   . Chronic renal insufficiency    . Coronary artery disease     25% LAD stenosis on cath 2007.  Stable angina.  . Diverticulosis    . Diverticulosis 11/12/2013  . Essential hypertension    . Frequent PVCs   . Gastroesophageal reflux disease    . Gout    . Hyperlipidemia LDL goal < 100    . Internal hemorrhoids without complication 04/59/9774  . Long-term current use of opiate analgesic 09/07/2016  . Mild carpal tunnel syndrome of right wrist 01/24/2016   Mild degree electrically per EMG 01/24/2016   . Mild carpal tunnel syndrome of right wrist 01/24/2016   Mild degree electrically per EMG 01/24/2016   . Morbid obesity with BMI of 40.0-44.9, adult ([place])    . Normocytic anemia    . NSVT (nonsustained ventricular tachycardia) ([place])   . Obstructive sleep apnea     Moderate, AHI 29.8 per hour with moderately loud snoring and oxygen desaturation to a nadir of 79%. CPAP titration resulted in  a prescription for 17 CWP.    [name] Open-angle glaucoma    . Osteoarthritis cervical spine    . Osteoarthritis of left knee 06/19/2013   Tricompartmental disease.  Treated with double hinged upright knee brace, steroid/xylocaine knee injections, and NSAIDs   . Osteoporosis 05/14/2017   s/p fracture of the right humerus from a fall at ground hight  . Persistent atrial fibrillation ([place])   . Right rotator cuff tear     Large full-thickness tear of the supraspinatus with mild retraction but no atrophy   . Right rotator cuff tear 04/25/2013   Large full-thickness tear of the supraspinatus with mild retraction but no atrophy    . Secondary male hypogonadism 02/07/2017   Likely secondary to chronic opioid use  . Secondary male  hypogonadism 02/07/2017   Likely secondary to chronic opioid use  . Subclinical hypothyroidism    . Tubular adenoma of colon 11/22/2017   Specifics unknown.  Repeat colonoscopy 08/12/2018 with six 3-6 mm tubular adenomas removed endoscopically.  . 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 ([place])    . Vasomotor rhinitis 04/25/2013    Past Surgical History:  Procedure Laterality Date  . A-FLUTTER ABLATION N/A 03/24/2019   Procedure: A-FLUTTER ABLATION;  Surgeon: [name], MD;  Location: [facility];  Service: Cardiovascular;  Laterality: N/A;  . CARDIOVERSION N/A 12/30/2014   Procedure: CARDIOVERSION;  Surgeon: [name], MD;  Location: [facility] ENDOSCOPY;  Service: Cardiovascular;  Laterality: N/A;  . FRACTURE SURGERY Left 1980's   Elbow  . Left arm surgery    . Left leg surgery    . SHOULDER SURGERY     Right    Current Outpatient Medications  Medication Sig Dispense Refill  . albuterol (VENTOLIN HFA) 108 (90 Base) MCG/ACT inhaler Inhale 1-2 puffs into the lungs every 6 (six) hours as needed for shortness of breath. 18 g 5  . allopurinol (ZYLOPRIM) 300 MG tablet Take 1 tablet (300 mg total) by mouth daily. Please note the change in the number of tablets to take daily. 90 tablet 3  . atorvastatin (LIPITOR) 20 MG tablet Take 1 tablet (20 mg total) by mouth daily. 90 tablet 3  . Blood Glucose Monitoring Suppl (ONETOUCH VERIO) w/Device KIT 1 each by Does not apply route daily. 1 kit 0  . ELIQUIS 5 MG TABS tablet TAKE 1 TABLET BY MOUTH TWICE A DAY 60 tablet 9  . flecainide (TAMBOCOR) 100 MG tablet Take 1 tablet (100 mg total) by mouth every 12 (twelve) hours. 180 tablet 3  . glucose blood (ONETOUCH VERIO) test strip Use as instructed 100 each 12  . latanoprost (XALATAN) 0.005 % ophthalmic solution Place 1 drop into both eyes at bedtime. 7.5 mL 2  . metFORMIN (GLUCOPHAGE) 1000 MG tablet Take 1 tablet (1,000 mg total) by mouth 2 (two) times daily with a  meal. 180 tablet 3  . metoprolol tartrate (LOPRESSOR) 25 MG tablet Take 1 tablet (25 mg total) by mouth 2 (two) times daily. 180 tablet 3  . nitroGLYCERIN (NITROSTAT) 0.4 MG SL tablet Place 1 tablet (0.4 mg total) under the tongue every 5 (five) minutes as needed for chest pain. 25 tablet 1  . ONETOUCH DELICA LANCETS 41Y MISC Use 1 strip daily 100 each 5  . oxyCODONE-acetaminophen (PERCOCET) 10-325 MG tablet Take 1 tablet by mouth every 6 (six) hours as needed for pain. 100 tablet 0  . pantoprazole (PROTONIX) 40 MG tablet TAKE 1 TABLET BY MOUTH  EVERY DAY 90 tablet 1  . Sorbitol 70 % SOLN Take 15-60 mLs by mouth daily as needed. 473 mL 11  . terazosin (HYTRIN) 5 MG capsule Take 1 capsule (5 mg total) by mouth at bedtime. 90 capsule 3  . tiotropium (SPIRIVA HANDIHALER) 18 MCG inhalation capsule Place 1 capsule (18 mcg total) into inhaler and inhale daily. 30 capsule 5  . torsemide (DEMADEX) 20 MG tablet TAKE 2 TABLETS BY MOUTH 3 TIMES PER WEEK 72 tablet 3   No current facility-administered medications for this visit.    Allergies  Allergen Reactions  . Ramipril Swelling    Facial swelling  . Tape Other (See Comments)    Irritates skin/ pls use paper tape  . Testosterone Rash      Review of Systems negative except from HPI and PMH  Physical Exam BP 122/84   Pulse 60   Ht 6' (1.829 m)   Wt (!) 317 lb (143.8 kg)   SpO2 97%   BMI 42.99 kg/m  Well developed and nourished in no acute distress HENT normal Neck supple with JVP-  8 Clear Regular rate and rhythm, no murmurs or gallops Abd-soft with active BS No Clubbing cyanosis edema Skin-warm and dry A & Oriented  Grossly normal sensory and motor function  ECG sinus at 60 Interval 21/11/49    Assessment and  Plan  Atrial fibrillation persistent  HFpEF  Morbid obesity  Obstructive sleep apnea     mild volume overload,  Encouraged lower salt intake-- continue diuretic  Not yet treated for OSA  Looking at mmouthpiece   No interval AF   On Anticoagulation;  No bleeding issues

## 2019-12-14 NOTE — Patient Instructions (Signed)
Medication Instructions:  Your physician recommends that you continue on your current medications as directed. Please refer to the Current Medication list given to you today.  *If you need a refill on your cardiac medications before your next appointment, please call your pharmacy*   Lab Work: None ordered.  If you have labs (blood work) drawn today and your tests are completely normal, you will receive your results only by: [name] MyChart Message (if you have MyChart) OR . A paper copy in the mail If you have any lab test that is abnormal or we need to change your treatment, we will call you to review the results.   Testing/Procedures: None ordered.    Follow-Up: At [facility], you and your health needs are our priority.  As part of our continuing mission to provide you with exceptional heart care, we have created designated Provider Care Teams.  These Care Teams include your primary Cardiologist (physician) and Advanced Practice Providers (APPs -  Physician Assistants and Nurse Practitioners) who all work together to provide you with the care you need, when you need it.  We recommend signing up for the patient portal called "MyChart".  Sign up information is provided on this After Visit Summary.  MyChart is used to connect with patients for Virtual Visits (Telemedicine).  Patients are able to view lab/test results, encounter notes, upcoming appointments, etc.  Non-urgent messages can be sent to your provider as well.   To learn more about what you can do with MyChart, go to [email or web address].    Your next appointment:   6 month(s)  The format for your next appointment:   In Person  Provider:   You may see [name], MD or one of the following Advanced Practice Providers on your designated Care Team:    Chanetta Marshall, NP  Tommye Standard, PA-C  Legrand Como "[place]" [place], [place]

## 2019-12-15 ENCOUNTER — Other Ambulatory Visit (HOSPITAL_COMMUNITY)
Admission: RE | Admit: 2019-12-15 | Discharge: 2019-12-15 | Disposition: A | Discharge status: Home / Self Care | Payer: Medicare Other | Source: Ambulatory Visit | Attending: Student in an Organized Health Care Education/Training Program | Admitting: Student in an Organized Health Care Education/Training Program

## 2019-12-15 DIAGNOSIS — Z01812 Encounter for preprocedural laboratory examination: Secondary | ICD-10-CM | POA: Insufficient documentation

## 2019-12-15 DIAGNOSIS — Z20822 Contact with and (suspected) exposure to covid-19: Secondary | ICD-10-CM | POA: Insufficient documentation

## 2019-12-15 LAB — SARS CORONAVIRUS 2 (TAT 6-24 HRS): SARS Coronavirus 2: NEGATIVE

## 2019-12-18 ENCOUNTER — Other Ambulatory Visit: Payer: Self-pay

## 2019-12-18 ENCOUNTER — Ambulatory Visit (HOSPITAL_COMMUNITY)
Admission: RE | Admit: 2019-12-18 | Discharge: 2019-12-18 | Disposition: A | Discharge status: Home / Self Care | Payer: Medicare Other | Source: Ambulatory Visit | Attending: Student in an Organized Health Care Education/Training Program | Admitting: Student in an Organized Health Care Education/Training Program

## 2019-12-18 DIAGNOSIS — J410 Simple chronic bronchitis: Secondary | ICD-10-CM | POA: Insufficient documentation

## 2019-12-18 LAB — PULMONARY FUNCTION TEST
DL/VA % pred: 100 %
DL/VA: 4.11 ml/min/mmHg/L
DLCO cor % pred: 48 %
DLCO cor: 13.7 ml/min/mmHg
DLCO unc % pred: 46 %
DLCO unc: 13.26 ml/min/mmHg
FEF 25-75 Post: 1.35 L/sec
FEF 25-75 Pre: 1.13 L/sec
FEF2575-%Change-Post: 19 %
FEF2575-%Pred-Post: 46 %
FEF2575-%Pred-Pre: 39 %
FEV1-%Change-Post: 5 %
FEV1-%Pred-Post: 45 %
FEV1-%Pred-Pre: 43 %
FEV1-Post: 1.48 L
FEV1-Pre: 1.41 L
FEV1FVC-%Change-Post: 0 %
FEV1FVC-%Pred-Pre: 97 %
FEV6-%Change-Post: 6 %
FEV6-%Pred-Post: 48 %
FEV6-%Pred-Pre: 45 %
FEV6-Post: 1.97 L
FEV6-Pre: 1.85 L
FEV6FVC-%Pred-Post: 104 %
FEV6FVC-%Pred-Pre: 104 %
FVC-%Change-Post: 6 %
FVC-%Pred-Post: 46 %
FVC-%Pred-Pre: 43 %
FVC-Post: 1.97 L
FVC-Pre: 1.86 L
Post FEV1/FVC ratio: 75 %
Post FEV6/FVC ratio: 100 %
Pre FEV1/FVC ratio: 76 %
Pre FEV6/FVC Ratio: 100 %
RV % pred: 116 %
RV: 2.86 L
TLC % pred: 67 %
TLC: 5 L

## 2019-12-18 MED ORDER — ALBUTEROL SULFATE (2.5 MG/3ML) 0.083% IN NEBU
2.5000 mg | INHALATION_SOLUTION | Freq: Once | RESPIRATORY_TRACT | Status: AC
  Administered 2019-12-18: 13:00:00 2.5 mg via RESPIRATORY_TRACT

## 2019-12-28 ENCOUNTER — Telehealth: Payer: Self-pay | Admitting: Student in an Organized Health Care Education/Training Program

## 2019-12-28 DIAGNOSIS — J410 Simple chronic bronchitis: Secondary | ICD-10-CM

## 2019-12-28 DIAGNOSIS — T402X5A Adverse effect of other opioids, initial encounter: Secondary | ICD-10-CM

## 2019-12-28 DIAGNOSIS — K5903 Drug induced constipation: Secondary | ICD-10-CM

## 2019-12-28 MED ORDER — SORBITOL 70 % RE SOLN: 15.0000 mL | Freq: Every day | RECTAL | 11 refills | Status: DC | PRN

## 2019-12-28 NOTE — Telephone Encounter (Signed)
Spoke with patient by phone regarding recent PFTs. He has worsening obstructive lung disease, not responsive to bronchodilators, and worsening diffusion defect. He remains very symptomatic, having dyspnea with any light exertion which is becoming function limiting. I anticipate this is multifactorial, but because the symptoms are worsening despite bronchodilator therapy we will refer him to pulmonology for further testing and treatment.

## 2019-12-31 ENCOUNTER — Ambulatory Visit (INDEPENDENT_AMBULATORY_CARE_PROVIDER_SITE_OTHER): Payer: Medicare Other | Admitting: Internal Medicine

## 2019-12-31 ENCOUNTER — Encounter: Payer: Self-pay | Admitting: Internal Medicine

## 2019-12-31 VITALS — BP 130/72 | HR 64 | Temp 98.2°F | Ht 72.0 in | Wt 313.0 lb

## 2019-12-31 DIAGNOSIS — Z7901 Long term (current) use of anticoagulants: Secondary | ICD-10-CM | POA: Diagnosis not present

## 2019-12-31 DIAGNOSIS — K219 Gastro-esophageal reflux disease without esophagitis: Secondary | ICD-10-CM | POA: Diagnosis not present

## 2019-12-31 DIAGNOSIS — R1310 Dysphagia, unspecified: Secondary | ICD-10-CM | POA: Diagnosis not present

## 2019-12-31 DIAGNOSIS — R140 Abdominal distension (gaseous): Secondary | ICD-10-CM

## 2019-12-31 MED ORDER — PANTOPRAZOLE SODIUM 40 MG PO TBEC: 40.0000 mg | DELAYED_RELEASE_TABLET | Freq: Two times a day (BID) | ORAL | 3 refills | Status: DC

## 2019-12-31 NOTE — Progress Notes (Signed)
HISTORY OF PRESENT ILLNESS:  Juan Calderon is a 64 y.o. male with multiple significant medical problems including morbid obesity, sleep apnea, congestive heart failure with ejection fraction between 55 and 60%, history of atrial fibrillation for which she is on Eliquis, and diabetes mellitus.  He also has a history of adenomatous colon polyps.  Last colonoscopy December 2019 revealed 6 adenomatous colon polyps and pandiverticulosis as well as internal hemorrhoids.  Follow-up in 3 years recommended.  Patient presents now with complaints of reflux, dysphagia to cold liquids, bloating, and concerns over possible gastric ulcers.  Patient tells me that he requires pantoprazole 40 mg daily for his reflux.  Off medication, significant symptoms.  On medication, symptoms controlled, though he has experienced some significant breakthrough recently.  His dysphagia is quite vague.  He describes occasional pressure sensation or slow transit of cold liquids only.  No problems with solids.  No weight loss.  No nausea or vomiting.  He also describes bloating abdominal discomfort.  He does have a history of gastric ulcers and is concerned that this may represent the same.  Review of outside blood work from December 07, 2019 shows unremarkable basic metabolic panel.  Normal CBC with hemoglobin 13.5.  He has completed his Covid vaccination series  REVIEW OF SYSTEMS:  All non-GI ROS negative unless otherwise stated in the HPI except for lower extremity swelling and shortness of breath  Past Medical History:  Diagnosis Date  . Alcohol abuse    . Bradycardia   . C6 radiculopathy 01/24/2016   Right upper extremity, mild to moderate electrically by EMG on 01/24/2016  . Cataract    Left eye  . Chronic diastolic heart failure ([place])     with mild left ventricular hypertrophy on Echo 02/2010  . Chronic obstructive pulmonary disease ([place])    . Chronic osteomyelitis of femur ([place]) 04/06/2016  . Chronic osteomyelitis of left femur  ([place]) 11/22/2017   Left femur s/p prior trauma  . Chronic osteomyelitis of left femur ([place]) 11/22/2017   Brodie's abscess: left femur s/p prior trauma.  Underwent partial excision and curettage of left femoral osteomyelitis at [facility] 12/30/2017 with grossly purulent material encountered within the medullary canal of the left distal femur.  Cultures grew MSSA.  Post-operatively received 6 weeks of IV antibiotics through 02/10/2018.  CRP elevated at 60.3 at end of IV antibiotic course so continued on Keflex  . Chronic pain syndrome     Left arm and leg s/p traumatic injury   . Chronic renal insufficiency    . Coronary artery disease     25% LAD stenosis on cath 2007.  Stable angina.  . Diverticulosis    . Diverticulosis 11/12/2013  . Essential hypertension    . Frequent PVCs   . Gastroesophageal reflux disease    . Gout    . Hyperlipidemia LDL goal < 100    . Internal hemorrhoids without complication [id number]  . Long-term current use of opiate analgesic 09/07/2016  . Mild carpal tunnel syndrome of right wrist 01/24/2016   Mild degree electrically per EMG 01/24/2016   . Mild carpal tunnel syndrome of right wrist 01/24/2016   Mild degree electrically per EMG 01/24/2016   . Morbid obesity with BMI of 40.0-44.9, adult ([place])    . Normocytic anemia    . NSVT (nonsustained ventricular tachycardia) ([place])   . Obstructive sleep apnea     Moderate, AHI 29.8 per hour with moderately loud snoring and oxygen desaturation to a nadir of 79%.  CPAP titration resulted in a prescription for 17 CWP.    [name] Open-angle glaucoma    . Osteoarthritis cervical spine    . Osteoarthritis of left knee 06/19/2013   Tricompartmental disease.  Treated with double hinged upright knee brace, steroid/xylocaine knee injections, and NSAIDs   . Osteoporosis 05/14/2017   s/p fracture of the right humerus from a fall at ground hight  . Persistent atrial fibrillation ([place])   . Right rotator cuff tear     Large full-thickness tear of the  supraspinatus with mild retraction but no atrophy   . Right rotator cuff tear 04/25/2013   Large full-thickness tear of the supraspinatus with mild retraction but no atrophy    . Secondary male hypogonadism 02/07/2017   Likely secondary to chronic opioid use  . Secondary male hypogonadism 02/07/2017   Likely secondary to chronic opioid use  . Subclinical hypothyroidism    . Tubular adenoma of colon 11/22/2017   Specifics unknown.  Repeat colonoscopy 08/12/2018 with six 3-6 mm tubular adenomas removed endoscopically.  . 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 ([place])    . Vasomotor rhinitis 04/25/2013    Past Surgical History:  Procedure Laterality Date  . A-FLUTTER ABLATION N/A 03/24/2019   Procedure: A-FLUTTER ABLATION;  Surgeon: [name], MD;  Location: [facility];  Service: Cardiovascular;  Laterality: N/A;  . CARDIOVERSION N/A 12/30/2014   Procedure: CARDIOVERSION;  Surgeon: [name], MD;  Location: [facility] ENDOSCOPY;  Service: Cardiovascular;  Laterality: N/A;  . FRACTURE SURGERY Left 1980's   Elbow  . Left arm surgery    . Left leg surgery    . SHOULDER SURGERY     Right    Social History [name]  reports that he has never smoked. He has never used smokeless tobacco. He reports current alcohol use of about 14.0 standard drinks of alcohol per week. He reports that he does not use drugs.  family history includes Alzheimer's disease in his father; Asthma in his mother; Cancer in his brother and brother; Early death in his brother; Heart failure in his mother; Hypertension in his sister, sister, and sister; Prostate cancer in his brother.  Allergies  Allergen Reactions  . Ramipril Swelling    Facial swelling  . Tape Other (See Comments)    Irritates skin/ pls use paper tape  . Testosterone Rash       PHYSICAL EXAMINATION: Vital signs: BP 130/72   Pulse 64   Temp 98.2 F (36.8 C)   Ht 6' (1.829 m)   Wt (!) 313 lb (142 kg)    BMI 42.45 kg/m   Constitutional: generally well-appearing, no acute distress Psychiatric: alert and oriented x3, cooperative Eyes: extraocular movements intact, anicteric, conjunctiva pink Mouth: oral pharynx moist, no lesions Neck: supple no lymphadenopathy Cardiovascular: heart regular rate and rhythm, no murmur Lungs: clear to auscultation bilaterally Abdomen: soft, obese, nontender, nondistended, no obvious ascites, no peritoneal signs, normal bowel sounds, no organomegaly Rectal: Omitted Extremities: no clubbing or cyanosis.  1+ lower extremity edema bilaterally Skin: no lesions on visible extremities Neuro: No focal deficits.  Cranial nerves intact  ASSESSMENT:  1.  GERD.  Experiencing breakthrough symptoms on once daily pantoprazole 2.  Vague dysphagia.  Sounds like spasm or dysmotility.  Exclusive to cold liquids 3.  Abdominal bloating discomfort.  Vague.  No alarm features such as weight loss or bleeding. 4.  Multiple significant medical problems.  Obesity.  On Eliquis.  Diabetes. 5.  History of adenomatous colon polyps.  Multiple.  Surveillance up-to-date  PLAN:  1.  Reflux precautions with attention to weight loss 2.  Prescribe pantoprazole 40 mg twice daily.  Medication risks reviewed 3.  Schedule upper endoscopy to evaluate reflux symptoms refractory to once daily PPI and vague dysphagia.  The patient is HIGH RISK given his comorbidities and the need to address his anticoagulation and diabetic therapies.  Patient will stay ON his Eliquis.  Pros and cons of this strategy reviewed.  He understands.The nature of the procedure, as well as the risks, benefits, and alternatives were carefully and thoroughly reviewed with the patient. Ample time for discussion and questions allowed. The patient understood, was satisfied, and agreed to proceed. 4.  Surveillance colonoscopy around December 2022 5.  Ongoing general medical care with Dr. Evette Doffing

## 2019-12-31 NOTE — Patient Instructions (Signed)
We have sent the following medications to your pharmacy for you to pick up at your convenience:  Protonix  You have been scheduled for an endoscopy. Please follow written instructions given to you at your visit today. If you use inhalers (even only as needed), please bring them with you on the day of your procedure.   

## 2020-01-04 ENCOUNTER — Other Ambulatory Visit: Payer: Self-pay | Admitting: Student in an Organized Health Care Education/Training Program

## 2020-01-04 MED ORDER — OXYCODONE-ACETAMINOPHEN 10-325 MG PO TABS: 1.0000 | ORAL_TABLET | Freq: Four times a day (QID) | ORAL | 0 refills | Status: DC | PRN

## 2020-01-05 ENCOUNTER — Other Ambulatory Visit: Payer: Self-pay

## 2020-01-05 ENCOUNTER — Encounter: Payer: Self-pay | Admitting: Internal Medicine

## 2020-01-05 ENCOUNTER — Encounter: Payer: Self-pay | Admitting: *Deleted

## 2020-01-05 ENCOUNTER — Ambulatory Visit (AMBULATORY_SURGERY_CENTER): Payer: Medicare Other | Admitting: Internal Medicine

## 2020-01-05 VITALS — BP 137/85 | HR 58 | Temp 97.7°F | Resp 13 | Ht 72.0 in | Wt 313.0 lb

## 2020-01-05 DIAGNOSIS — R1310 Dysphagia, unspecified: Secondary | ICD-10-CM

## 2020-01-05 DIAGNOSIS — R1013 Epigastric pain: Secondary | ICD-10-CM | POA: Diagnosis not present

## 2020-01-05 DIAGNOSIS — K219 Gastro-esophageal reflux disease without esophagitis: Secondary | ICD-10-CM

## 2020-01-05 MED ORDER — SODIUM CHLORIDE 0.9 % IV SOLN: 500.0000 mL | Freq: Once | INTRAVENOUS | Status: DC

## 2020-01-05 NOTE — Progress Notes (Signed)
Called to room to assist during endoscopic procedure.  Patient ID and intended procedure confirmed with present staff. Received instructions for my participation in the procedure from the performing physician.  

## 2020-01-05 NOTE — Progress Notes (Signed)
Temp by JB Vitals by DT  Pt's states no medical or surgical changes since previsit or office visit. 

## 2020-01-05 NOTE — Op Note (Signed)
[facility] Patient Name: Juan Calderon Procedure Date: 01/05/2020 1:03 PM MRN: [id number] Endoscopist: Docia Chuck. Henrene Pastor , MD Age: 64 Referring MD:  Date of Birth: [birth year] Gender: Male Account #: [id number] Procedure:                Upper GI endoscopy Indications:              Dyspepsia, Dysphagia (atypical), Esophageal reflux,                            personal hx PUD Medicines:                Monitored Anesthesia Care Procedure:                Pre-Anesthesia Assessment:                           - Prior to the procedure, a History and Physical                            was performed, and patient medications and                            allergies were reviewed. The patient's tolerance of                            previous anesthesia was also reviewed. The risks                            and benefits of the procedure and the sedation                            options and risks were discussed with the patient.                            All questions were answered, and informed consent                            was obtained. Prior Anticoagulants: The patient has                            taken Eliquis (apixaban), last dose was day of                            procedure. ASA Grade Assessment: III - A patient                            with severe systemic disease. After reviewing the                            risks and benefits, the patient was deemed in                            satisfactory condition to undergo the procedure.  After obtaining informed consent, the endoscope was                            passed under direct vision. Throughout the                            procedure, the patient's blood pressure, pulse, and                            oxygen saturations were monitored continuously. The                            Endoscope was introduced through the mouth, and                            advanced to the second part of  duodenum. The upper                            GI endoscopy was accomplished without difficulty.                            The patient tolerated the procedure well. Scope In: Scope Out: Findings:                 The esophagus was normal.                           The stomach was normal. Biopsies were taken with a                            cold forceps for Helicobacter pylori testing using                            CLOtest.                           The examined duodenum was normal.                           The cardia and gastric fundus were normal on                            retroflexion. Complications:            No immediate complications. Estimated Blood Loss:     Estimated blood loss: none. Impression:               - Normal esophagus.                           - Normal stomach. Biopsied.                           - Normal examined duodenum. Recommendation:           - Patient has a contact number available for  emergencies. The signs and symptoms of potential                            delayed complications were discussed with the                            patient. Return to normal activities tomorrow.                            Written discharge instructions were provided to the                            patient.                           - Resume previous diet.                           - Continue present medications, including Eliquis.                           - Reflux precautions with attention to weight loss Docia Chuck. Henrene Pastor, MD 01/05/2020 1:41:29 PM This report has been signed electronically.

## 2020-01-05 NOTE — Progress Notes (Signed)
pt tolerated well. VSS. awake and to recovery. Report given to RN. Bite block placed and removed with ease. Atraumatic. 

## 2020-01-05 NOTE — Patient Instructions (Signed)
Normal exam. Biopsies taken. Continue Eliquis.   Await pathology for final recommendations.      YOU HAD AN ENDOSCOPIC PROCEDURE TODAY AT [facility]:   Refer to the procedure report that was given to you for any specific questions about what was found during the examination.  If the procedure report does not answer your questions, please call your gastroenterologist to clarify.  If you requested that your care partner not be given the details of your procedure findings, then the procedure report has been included in a sealed envelope for you to review at your convenience later.  YOU SHOULD EXPECT: Some feelings of bloating in the abdomen. Passage of more gas than usual.  Walking can help get rid of the air that was put into your GI tract during the procedure and reduce the bloating. If you had a lower endoscopy (such as a colonoscopy or flexible sigmoidoscopy) you may notice spotting of blood in your stool or on the toilet paper. If you underwent a bowel prep for your procedure, you may not have a normal bowel movement for a few days.  Please Note:  You might notice some irritation and congestion in your nose or some drainage.  This is from the oxygen used during your procedure.  There is no need for concern and it should clear up in a day or so.  SYMPTOMS TO REPORT IMMEDIATELY:   Following upper endoscopy (EGD)  Vomiting of blood or coffee ground material  New chest pain or pain under the shoulder blades  Painful or persistently difficult swallowing  New shortness of breath  Fever of 100F or higher  Black, tarry-looking stools  For urgent or emergent issues, a gastroenterologist can be reached at any hour by calling [phone]. Do not use MyChart messaging for urgent concerns.    DIET:  We do recommend a small meal at first, but then you may proceed to your regular diet.  Drink plenty of fluids but you should avoid alcoholic beverages for 24 hours.  ACTIVITY:  You  should plan to take it easy for the rest of today and you should NOT DRIVE or use heavy machinery until tomorrow (because of the sedation medicines used during the test).    FOLLOW UP: Our staff will call the number listed on your records 48-72 hours following your procedure to check on you and address any questions or concerns that you may have regarding the information given to you following your procedure. If we do not reach you, we will leave a message.  We will attempt to reach you two times.  During this call, we will ask if you have developed any symptoms of COVID 19. If you develop any symptoms (ie: fever, flu-like symptoms, shortness of breath, cough etc.) before then, please call [phone].  If you test positive for Covid 19 in the 2 weeks post procedure, please call and report this information to [place].    If any biopsies were taken you will be contacted by phone or by letter within the next 1-3 weeks.  Please call us at [phone] if you have not heard about the biopsies in 3 weeks.    SIGNATURES/CONFIDENTIALITY: You and/or your care partner have signed paperwork which will be entered into your electronic medical record.  These signatures attest to the fact that that the information above on your After Visit Summary has been reviewed and is understood.  Full responsibility of the confidentiality of this discharge information lies with  you and/or your care-partner.

## 2020-01-07 ENCOUNTER — Telehealth: Payer: Self-pay

## 2020-01-07 LAB — HELICOBACTER PYLORI SCREEN-BIOPSY: UREASE: NEGATIVE

## 2020-01-07 NOTE — Telephone Encounter (Signed)
  Follow up Call-  Call back number 01/05/2020 08/12/2018  Post procedure Call Back phone  # [phone]  Permission to leave phone message Yes Yes  Some recent data might be hidden     Patient questions:  Do you have a fever, pain , or abdominal swelling? Yes.   Pain Score  5  pt c/o lower abdominal pain.  Says he has already discussed with the MD.  Encouraged pt to follow reflux precautions to reduce occurrence of pain.  Have you tolerated food without any problems? Yes  Have you been able to return to your normal activities? Yes  Do you have any questions about your discharge instructions: Diet   No. Medications  No. Follow up visit  No.  Do you have questions or concerns about your Care? No.  Actions: * If pain score is 4 or above: Physician/ provider Notified : Scarlette Shorts, MD. 01/07/20 @ 0725  1. Have you developed a fever since your procedure? no  2.   Have you had an respiratory symptoms (SOB or cough) since your procedure? no  3.   Have you tested positive for COVID 19 since your procedure no  4.   Have you had any family members/close contacts diagnosed with the COVID 19 since your procedure?  no   If yes to any of these questions please route to Joylene John, RN and Erenest Rasher, RN

## 2020-01-07 NOTE — Telephone Encounter (Signed)
Noted.  This is not new.  He complained of this yesterday, new onset, with a completely benign exam.  Thanks

## 2020-01-30 ENCOUNTER — Other Ambulatory Visit: Payer: Self-pay | Admitting: Student in an Organized Health Care Education/Training Program

## 2020-02-03 ENCOUNTER — Other Ambulatory Visit: Payer: Self-pay | Admitting: Internal Medicine

## 2020-02-03 MED ORDER — OXYCODONE-ACETAMINOPHEN 10-325 MG PO TABS: 1.0000 | ORAL_TABLET | Freq: Four times a day (QID) | ORAL | 0 refills | Status: DC | PRN

## 2020-02-04 ENCOUNTER — Other Ambulatory Visit: Payer: Self-pay | Admitting: Student in an Organized Health Care Education/Training Program

## 2020-02-04 ENCOUNTER — Encounter: Payer: Self-pay | Admitting: Critical Care Medicine

## 2020-02-04 ENCOUNTER — Ambulatory Visit (INDEPENDENT_AMBULATORY_CARE_PROVIDER_SITE_OTHER): Payer: Medicare Other | Admitting: Critical Care Medicine

## 2020-02-04 ENCOUNTER — Other Ambulatory Visit: Payer: Self-pay

## 2020-02-04 DIAGNOSIS — R0609 Other forms of dyspnea: Secondary | ICD-10-CM

## 2020-02-04 DIAGNOSIS — R0600 Dyspnea, unspecified: Secondary | ICD-10-CM | POA: Diagnosis not present

## 2020-02-04 DIAGNOSIS — R5381 Other malaise: Secondary | ICD-10-CM | POA: Diagnosis not present

## 2020-02-04 DIAGNOSIS — J4540 Moderate persistent asthma, uncomplicated: Secondary | ICD-10-CM | POA: Diagnosis not present

## 2020-02-04 DIAGNOSIS — I5032 Chronic diastolic (congestive) heart failure: Secondary | ICD-10-CM

## 2020-02-04 MED ORDER — FLUTICASONE-SALMETEROL 250-50 MCG/DOSE IN AEPB
1.0000 | INHALATION_SPRAY | Freq: Two times a day (BID) | RESPIRATORY_TRACT | 11 refills | Status: DC
Start: 2020-02-04 — End: 2020-03-15

## 2020-02-04 NOTE — Progress Notes (Signed)
Synopsis: Referred in June 2021 for chronic bronchitis by Axel Filler,*.  Subjective:   PATIENT ID: Juan Calderon GENDER: male DOB: 1955/10/26, MRN: [id number]  Chief Complaint  Patient presents with  . Consult    SOB increased     Juan Calderon is a 64 y/o gentleman who presents for evaluation of chronic shortness of breath.  This has been progressively worsening over several years.  He has no shortness of breath at rest.  He has previously been evaluated by cardiology but was felt that his dyspnea is not fully explained by cardiac disease.  He has been on Spiriva for about 6 months and has used albuterol as needed for years without noticeable improvement.  Nothing improves his symptoms, but his symptoms are worse with walking, bending over, and around strong chemical smells.  He has occasional wheezing, cough, but no sputum production.  He has seasonal allergies with eye watering, but no sinus symptoms.  He has a former infrequent smoker-pipes, cigars, occasionally cigarettes, but he quit many years ago.  He has a family history of asthma in his mother.  Medical history is notable for OSA, which was diagnosed in 2014.  He was previously managed by Dr. Annamaria Boots.  He has a history of atrial fibrillation, for which he takes chronic flecainide.  He is disabled due to a leg injury many years ago, but formerly worked as a Horticulturist, commercial.  He has been around bricks being cut and thinks he has been around silica and asbestos.     Past Medical History:  Diagnosis Date  . Alcohol abuse    . Bradycardia   . C6 radiculopathy 01/24/2016   Right upper extremity, mild to moderate electrically by EMG on 01/24/2016  . Cataract    Left eye  . Chronic diastolic heart failure ([place])     with mild left ventricular hypertrophy on Echo 02/2010  . Chronic obstructive pulmonary disease ([place])    . Chronic osteomyelitis of femur ([place]) 04/06/2016  . Chronic osteomyelitis of left femur ([place]) 11/22/2017   Left  femur s/p prior trauma  . Chronic osteomyelitis of left femur ([place]) 11/22/2017   Brodie's abscess: left femur s/p prior trauma.  Underwent partial excision and curettage of left femoral osteomyelitis at [facility] 12/30/2017 with grossly purulent material encountered within the medullary canal of the left distal femur.  Cultures grew MSSA.  Post-operatively received 6 weeks of IV antibiotics through 02/10/2018.  CRP elevated at 60.3 at end of IV antibiotic course so continued on Keflex  . Chronic pain syndrome     Left arm and leg s/p traumatic injury   . Chronic renal insufficiency    . Coronary artery disease     25% LAD stenosis on cath 2007.  Stable angina.  . Diverticulosis    . Diverticulosis 11/12/2013  . Essential hypertension    . Frequent PVCs   . Gastroesophageal reflux disease    . Gout    . Hyperlipidemia LDL goal < 100    . Internal hemorrhoids without complication 78/24/2353  . Long-term current use of opiate analgesic 09/07/2016  . Mild carpal tunnel syndrome of right wrist 01/24/2016   Mild degree electrically per EMG 01/24/2016   . Mild carpal tunnel syndrome of right wrist 01/24/2016   Mild degree electrically per EMG 01/24/2016   . Morbid obesity with BMI of 40.0-44.9, adult ([place])    . Normocytic anemia    . NSVT (nonsustained ventricular tachycardia) ([place])   . Obstructive  sleep apnea     Moderate, AHI 29.8 per hour with moderately loud snoring and oxygen desaturation to a nadir of 79%. CPAP titration resulted in a prescription for 17 CWP.    [name] Open-angle glaucoma    . Osteoarthritis cervical spine    . Osteoarthritis of left knee 06/19/2013   Tricompartmental disease.  Treated with double hinged upright knee brace, steroid/xylocaine knee injections, and NSAIDs   . Osteoporosis 05/14/2017   s/p fracture of the right humerus from a fall at ground hight  . Persistent atrial fibrillation ([place])   . Right rotator cuff tear     Large full-thickness tear of the supraspinatus with mild  retraction but no atrophy   . Right rotator cuff tear 04/25/2013   Large full-thickness tear of the supraspinatus with mild retraction but no atrophy    . Secondary male hypogonadism 02/07/2017   Likely secondary to chronic opioid use  . Secondary male hypogonadism 02/07/2017   Likely secondary to chronic opioid use  . Subclinical hypothyroidism    . Tubular adenoma of colon 11/22/2017   Specifics unknown.  Repeat colonoscopy 08/12/2018 with six 3-6 mm tubular adenomas removed endoscopically.  . 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 ([place])    . Vasomotor rhinitis 04/25/2013     Family History  Problem Relation Age of Onset  . Heart failure Mother   . Asthma Mother   . Alzheimer's disease Father   . Hypertension Sister   . Hypertension Sister   . Hypertension Sister   . Cancer Brother        unknown  . Early death Brother        Manufacturing systems engineer  . Cancer Brother        prostate  . Prostate cancer Brother   . Heart attack Neg Hx   . Stroke Neg Hx   . Colon cancer Neg Hx   . Esophageal cancer Neg Hx   . Pancreatic cancer Neg Hx   . Stomach cancer Neg Hx   . Liver disease Neg Hx      Past Surgical History:  Procedure Laterality Date  . A-FLUTTER ABLATION N/A 03/24/2019   Procedure: A-FLUTTER ABLATION;  Surgeon: [name], MD;  Location: [facility];  Service: Cardiovascular;  Laterality: N/A;  . CARDIOVERSION N/A 12/30/2014   Procedure: CARDIOVERSION;  Surgeon: [name], MD;  [facility];  Service: Cardiovascular;  Laterality: N/A;  . FRACTURE SURGERY Left 1980's   Elbow  . Left arm surgery    . Left leg surgery    . SHOULDER SURGERY     Right    Social History   Socioeconomic History  . Marital status: Widowed    Spouse name: Not on file  . Number of children: Not on file  . Years of education: Not on file  . Highest education level: Not on file  Occupational History  . Occupation: Disabled  Tobacco Use    . Smoking status: Never Smoker  . Smokeless tobacco: Never Used  Substance and Sexual Activity  . Alcohol use: Yes    Alcohol/week: 14.0 standard drinks    Types: 14 Cans of beer per week  . Drug use: No  . Sexual activity: Not Currently  Other Topics Concern  . Not on file  Social History Narrative   Current Social History 07/01/2019        Patient lives with friends in a home which is 1 story. There  are 4 steps with handrails up to the entrance the patient uses.       Patient's method of transportation is personal car.      The highest level of education was high school diploma.      The patient currently disabled.      Identified important Relationships are "My son and my sister"       Pets : None       Interests / Fun: "Watch TV"       Current Stressors: "Not being able to get around like I used to."       Religious / Personal Beliefs: Baptist       L. [place]atte, BSN, RN-BC       Social Determinants of Health   Financial Resource Strain:   . Difficulty of Paying Living Expenses:   Food Insecurity:   . Worried About Charity fundraiser in the Last Year:   . Arboriculturist in the Last Year:   Transportation Needs:   . Film/video editor (Medical):   [name] Lack of Transportation (Non-Medical):   Physical Activity:   . Days of Exercise per Week:   . Minutes of Exercise per Session:   Stress:   . Feeling of Stress :   Social Connections:   . Frequency of Communication with Friends and Family:   . Frequency of Social Gatherings with Friends and Family:   . Attends Religious Services:   . Active Member of Clubs or Organizations:   . Attends Archivist Meetings:   [name] Marital Status:   Intimate Partner Violence:   . Fear of Current or Ex-Partner:   . Emotionally Abused:   [name] Physically Abused:   . Sexually Abused:      Allergies  Allergen Reactions  . Ramipril Swelling    Facial swelling  . Tape Other (See Comments)    Irritates skin/ pls use  paper tape  . Testosterone Rash     Immunization History  Administered Date(s) Administered  . Influenza,inj,Quad PF,6+ Mos 06/19/2013, 05/13/2014, 05/24/2016, 05/24/2017, 06/13/2018, 06/29/2019  . PFIZER SARS-COV-2 Vaccination 10/17/2019, 11/11/2019  . Pneumococcal Polysaccharide-23 08/07/2013  . Tdap 06/19/2013    Outpatient Medications Prior to Visit  Medication Sig Dispense Refill  . allopurinol (ZYLOPRIM) 300 MG tablet Take 1 tablet (300 mg total) by mouth daily. Please note the change in the number of tablets to take daily. 90 tablet 3  . atorvastatin (LIPITOR) 20 MG tablet Take 1 tablet (20 mg total) by mouth daily. 90 tablet 3  . Blood Glucose Monitoring Suppl (ONETOUCH VERIO) w/Device KIT 1 each by Does not apply route daily. 1 kit 0  . ELIQUIS 5 MG TABS tablet TAKE 1 TABLET BY MOUTH TWICE A DAY 60 tablet 9  . flecainide (TAMBOCOR) 100 MG tablet Take 1 tablet (100 mg total) by mouth every 12 (twelve) hours. 180 tablet 3  . glucose blood (ONETOUCH VERIO) test strip Use as instructed 100 each 12  . latanoprost (XALATAN) 0.005 % ophthalmic solution Place 1 drop into both eyes at bedtime. 7.5 mL 2  . magnesium oxide (MAG-OX) 400 MG tablet Take 400 mg by mouth 2 (two) times daily.    . metFORMIN (GLUCOPHAGE) 1000 MG tablet Take 1 tablet (1,000 mg total) by mouth 2 (two) times daily with a meal. 180 tablet 3  . metoprolol tartrate (LOPRESSOR) 25 MG tablet Take 1 tablet (25 mg total) by mouth 2 (two) times daily. 180 tablet 3  .  nitroGLYCERIN (NITROSTAT) 0.4 MG SL tablet Place 1 tablet (0.4 mg total) under the tongue every 5 (five) minutes as needed for chest pain. 25 tablet 1  . ONETOUCH DELICA LANCETS 38L MISC Use 1 strip daily 100 each 5  . oxyCODONE-acetaminophen (PERCOCET) 10-325 MG tablet Take 1 tablet by mouth every 6 (six) hours as needed for pain. 100 tablet 0  . pantoprazole (PROTONIX) 40 MG tablet Take 1 tablet (40 mg total) by mouth 2 (two) times daily. 180 tablet 3  .  Sorbitol 70 % SOLN Take 15-60 mLs by mouth daily as needed. 473 mL 11  . SPIRIVA HANDIHALER 18 MCG inhalation capsule INHALE 1 CAPSULE VIA HANDIHALER ONCE DAILY AT THE SAME TIME EVERY DAY 30 capsule 5  . terazosin (HYTRIN) 5 MG capsule Take 1 capsule (5 mg total) by mouth at bedtime. 90 capsule 3  . torsemide (DEMADEX) 20 MG tablet TAKE 2 TABLETS BY MOUTH 3 TIMES PER WEEK 72 tablet 1  . albuterol (VENTOLIN HFA) 108 (90 Base) MCG/ACT inhaler Inhale 1-2 puffs into the lungs every 6 (six) hours as needed for shortness of breath. (Patient not taking: Reported on 02/04/2020) 18 g 5   Facility-Administered Medications Prior to Visit  Medication Dose Route Frequency Provider Last Rate Last Admin  . 0.9 %  sodium chloride infusion  500 mL Intravenous Once Irene Shipper, MD        Review of Systems  Constitutional: Negative for chills and fever.  Respiratory: Positive for cough, shortness of breath and wheezing.   Cardiovascular: Negative for chest pain and leg swelling.  Skin: Negative for rash.  Endo/Heme/Allergies: Positive for environmental allergies.     Objective:   Vitals:   02/04/20 1347  BP: (!) 142/68  Pulse: 97  Temp: 98.2 F (36.8 C)  TempSrc: Oral  SpO2: 98%  Weight: (!) 314 lb 12.8 oz (142.8 kg)  Height: 6' (1.829 m)   98% on  RA BMI Readings from Last 3 Encounters:  02/04/20 42.69 kg/m  01/05/20 42.45 kg/m  12/31/19 42.45 kg/m   Wt Readings from Last 3 Encounters:  02/04/20 (!) 314 lb 12.8 oz (142.8 kg)  01/05/20 (!) 313 lb (142 kg)  12/31/19 (!) 313 lb (142 kg)    Physical Exam Constitutional:      General: He is not in acute distress.    Appearance: Normal appearance. He is obese.  HENT:     Head: Normocephalic and atraumatic.  Eyes:     General: No scleral icterus. Cardiovascular:     Rate and Rhythm: Normal rate and regular rhythm.     Heart sounds: No murmur.  Pulmonary:     Comments: Breathing comfortably on room air, no conversational dyspnea.   Decreased basilar breath sounds, otherwise clear to auscultation bilaterally.  No observed coughing. Abdominal:     General: There is no distension.     Palpations: Abdomen is soft.     Tenderness: There is no abdominal tenderness.  Musculoskeletal:        General: Swelling present.     Cervical back: Neck supple.  Lymphadenopathy:     Cervical: No cervical adenopathy.  Skin:    General: Skin is warm and dry.     Findings: No rash.  Neurological:     General: No focal deficit present.     Mental Status: He is alert.     Coordination: Coordination normal.  Psychiatric:        Mood and Affect: Mood normal.  Behavior: Behavior normal.      CBC    Component Value Date/Time   WBC 7.5 12/07/2019 1029   WBC 6.8 03/24/2019 0424   RBC 4.21 12/07/2019 1029   RBC 4.08 (L) 03/24/2019 0424   HGB 13.5 12/07/2019 1029   HCT 39.6 12/07/2019 1029   PLT 178 12/07/2019 1029   MCV 94 12/07/2019 1029   MCH 32.1 12/07/2019 1029   MCH 31.4 03/24/2019 0424   MCHC 34.1 12/07/2019 1029   MCHC 32.6 03/24/2019 0424   RDW 14.0 12/07/2019 1029   LYMPHSABS 1.3 03/23/2019 1030   LYMPHSABS 1.7 12/05/2016 1320   MONOABS 0.4 03/23/2019 1030   EOSABS 0.1 03/23/2019 1030   EOSABS 0.1 12/05/2016 1320   BASOSABS 0.0 03/23/2019 1030   BASOSABS 0.0 12/05/2016 1320    CHEMISTRY No results for input(s): NA, K, CL, CO2, GLUCOSE, BUN, CREATININE, CALCIUM, MG, PHOS in the last 168 hours. CrCl cannot be calculated (Patient's most recent lab result is older than the maximum 21 days allowed.).   Chest Imaging- films reviewed:  CXR, 2 view 03/23/2019-increased lung markings bilaterally centrally and in the bases suspicious for pulmonary edema.  Increased vascular protocol  Pulmonary Functions Testing Results: PFT Results Latest Ref Rng & Units 12/18/2019 08/07/2013  FVC-Pre L 1.86 2.55  FVC-Predicted Pre % 43 63  FVC-Post L 1.97 2.49  FVC-Predicted Post % 46 61  Pre FEV1/FVC % % 76 69  Post  FEV1/FCV % % 75 75  FEV1-Pre L 1.41 1.76  FEV1-Predicted Pre % 43 55  FEV1-Post L 1.48 1.85  DLCO UNC% % 46 58  DLCO COR %Predicted % 100 105  TLC L 5.00 4.83  TLC % Predicted % 67 69  RV % Predicted % 116 115  2021-no significant obstruction or bronchodilator reversibility.  Mild restriction, moderate diffusion impairment.  Severely reduced FEF 25-75 a significant bronchodilator reversibility and high airway resistance suggesting small airways disease.   Echocardiogram 03/24/2019: 60%, moderate LVH.  Unable to assess diastolic function due to atrial flutter.  Normal RV size with reduced RV function.  Mild to moderately dilated LA.  Normal valves.     Assessment & Plan:     ICD-10-CM   1. Morbid obesity due to excess calories (HCC)  E66.01   2. DOE (dyspnea on exertion)  R06.00 CT Chest High Resolution  3. Moderate persistent asthma without complication  W46.65   4. Physical deconditioning  R53.81     Chronic dyspnea on exertion for several years, likely multifactorial.  Chronic cardiac disease likely contributed, as have his obesity and deconditioning given the duration of his symptoms.  Given his occupational history, I am concerned for possible ILD related to his silica or asbestos exposure.  -HRCT chest  Possible asthma based on PFTs, although lack of response to LABA-ICS over time suggests against this. -Discontinue Spiriva.  Start Advair 250-50 twice daily.  Instructed to rinse his mouth after every use.   RTC in 1 month.   Current Outpatient Medications:  .  allopurinol (ZYLOPRIM) 300 MG tablet, Take 1 tablet (300 mg total) by mouth daily. Please note the change in the number of tablets to take daily., Disp: 90 tablet, Rfl: 3 .  atorvastatin (LIPITOR) 20 MG tablet, Take 1 tablet (20 mg total) by mouth daily., Disp: 90 tablet, Rfl: 3 .  Blood Glucose Monitoring Suppl (ONETOUCH VERIO) w/Device KIT, 1 each by Does not apply route daily., Disp: 1 kit, Rfl: 0 .  ELIQUIS 5 MG  TABS tablet, TAKE 1 TABLET BY MOUTH TWICE A DAY, Disp: 60 tablet, Rfl: 9 .  flecainide (TAMBOCOR) 100 MG tablet, Take 1 tablet (100 mg total) by mouth every 12 (twelve) hours., Disp: 180 tablet, Rfl: 3 .  glucose blood (ONETOUCH VERIO) test strip, Use as instructed, Disp: 100 each, Rfl: 12 .  latanoprost (XALATAN) 0.005 % ophthalmic solution, Place 1 drop into both eyes at bedtime., Disp: 7.5 mL, Rfl: 2 .  magnesium oxide (MAG-OX) 400 MG tablet, Take 400 mg by mouth 2 (two) times daily., Disp: , Rfl:  .  metFORMIN (GLUCOPHAGE) 1000 MG tablet, Take 1 tablet (1,000 mg total) by mouth 2 (two) times daily with a meal., Disp: 180 tablet, Rfl: 3 .  metoprolol tartrate (LOPRESSOR) 25 MG tablet, Take 1 tablet (25 mg total) by mouth 2 (two) times daily., Disp: 180 tablet, Rfl: 3 .  nitroGLYCERIN (NITROSTAT) 0.4 MG SL tablet, Place 1 tablet (0.4 mg total) under the tongue every 5 (five) minutes as needed for chest pain., Disp: 25 tablet, Rfl: 1 .  ONETOUCH DELICA LANCETS 98X MISC, Use 1 strip daily, Disp: 100 each, Rfl: 5 .  oxyCODONE-acetaminophen (PERCOCET) 10-325 MG tablet, Take 1 tablet by mouth every 6 (six) hours as needed for pain., Disp: 100 tablet, Rfl: 0 .  pantoprazole (PROTONIX) 40 MG tablet, Take 1 tablet (40 mg total) by mouth 2 (two) times daily., Disp: 180 tablet, Rfl: 3 .  Sorbitol 70 % SOLN, Take 15-60 mLs by mouth daily as needed., Disp: 473 mL, Rfl: 11 .  SPIRIVA HANDIHALER 18 MCG inhalation capsule, INHALE 1 CAPSULE VIA HANDIHALER ONCE DAILY AT THE SAME TIME EVERY DAY, Disp: 30 capsule, Rfl: 5 .  terazosin (HYTRIN) 5 MG capsule, Take 1 capsule (5 mg total) by mouth at bedtime., Disp: 90 capsule, Rfl: 3 .  torsemide (DEMADEX) 20 MG tablet, TAKE 2 TABLETS BY MOUTH 3 TIMES PER WEEK, Disp: 72 tablet, Rfl: 1 .  albuterol (VENTOLIN HFA) 108 (90 Base) MCG/ACT inhaler, Inhale 1-2 puffs into the lungs every 6 (six) hours as needed for shortness of breath. (Patient not taking: Reported on 02/04/2020),  Disp: 18 g, Rfl: 5 .  Fluticasone-Salmeterol (ADVAIR DISKUS) 250-50 MCG/DOSE AEPB, Inhale 1 puff into the lungs 2 (two) times daily., Disp: 60 each, Rfl: 11  Current Facility-Administered Medications:  .  0.9 %  sodium chloride infusion, 500 mL, Intravenous, Once, Irene Shipper, MD     [name], DO [facility] 02/04/2020 3:45 PM

## 2020-02-04 NOTE — Patient Instructions (Addendum)
Thank you for visiting Dr. Carlis Abbott at [facility]. We recommend the following: Orders Placed This Encounter  Procedures  . CT Chest High Resolution   Orders Placed This Encounter  Procedures  . CT Chest High Resolution    First available    Standing Status:   Future    Standing Expiration Date:   02/03/2021    Order Specific Question:   ** REASON FOR EXAM (FREE TEXT)    Answer:   history of silica, asbestos occupational exposure    Order Specific Question:   Preferred imaging location?    Answer:   [place]    Order Specific Question:   Radiology Contrast Protocol - do NOT remove file path    Answer:   [email or web address]    Meds ordered this encounter  Medications  . Fluticasone-Salmeterol (ADVAIR DISKUS) 250-50 MCG/DOSE AEPB    Sig: Inhale 1 puff into the lungs 2 (two) times daily.    Dispense:  60 each    Refill:  11    Stop Spiriva. Remember to rinse your mouth after using Advair twice daily.   Return in about 4 weeks (around 03/03/2020).    Please do your part to reduce the spread of COVID-19.

## 2020-02-15 DIAGNOSIS — H1045 Other chronic allergic conjunctivitis: Secondary | ICD-10-CM | POA: Diagnosis not present

## 2020-02-15 DIAGNOSIS — H04123 Dry eye syndrome of bilateral lacrimal glands: Secondary | ICD-10-CM | POA: Diagnosis not present

## 2020-02-15 DIAGNOSIS — H40013 Open angle with borderline findings, low risk, bilateral: Secondary | ICD-10-CM | POA: Diagnosis not present

## 2020-02-17 ENCOUNTER — Ambulatory Visit (HOSPITAL_COMMUNITY)
Admission: RE | Admit: 2020-02-17 | Discharge: 2020-02-17 | Disposition: A | Discharge status: Home / Self Care | Payer: Medicare Other | Source: Ambulatory Visit | Attending: Critical Care Medicine | Admitting: Critical Care Medicine

## 2020-02-17 ENCOUNTER — Other Ambulatory Visit: Payer: Self-pay

## 2020-02-17 DIAGNOSIS — R0609 Other forms of dyspnea: Secondary | ICD-10-CM

## 2020-02-17 DIAGNOSIS — R0602 Shortness of breath: Secondary | ICD-10-CM | POA: Diagnosis not present

## 2020-02-17 DIAGNOSIS — R0600 Dyspnea, unspecified: Secondary | ICD-10-CM

## 2020-02-19 ENCOUNTER — Telehealth: Payer: Self-pay | Admitting: Critical Care Medicine

## 2020-02-19 NOTE — Telephone Encounter (Signed)
Called and spoke with pt. Pt is requesting to know the results of recent procedure that he had performed. Dr. Carlis Abbott, please advise.

## 2020-02-22 NOTE — Telephone Encounter (Signed)
Ok, thanks for clarifying. I understand. He does have some scarring on his CT scan in the bottom of his lungs. We will review his scan during his visit on 7/13. Thanks!  LPC

## 2020-02-22 NOTE — Telephone Encounter (Signed)
Called and spoke with pt letting him know the results and info stated by Dr. Carlis Abbott and he verbalized understanding. Nothing further needed.

## 2020-02-22 NOTE — Telephone Encounter (Signed)
Juan Calderon, I just looked in his chart and he hasn't had a procedure in the [facility] system since May and that was with GI. If he has a question about that procedure, he should call GI further clarification. If this was at an outside system, I can't see their records and we would need to get these.   [name], DO 02/22/20 7:03 AM [facility]

## 2020-02-22 NOTE — Telephone Encounter (Signed)
Called and spoke with pt to further gather more information as I stated to him that we were not finding any procedure that he had done that was ordered by [place]. Pt stated he had been sent by Dr. Carlis Abbott to [place] further in pt's chart, I see that pt had a HRCT performed 6/16 and this is what pt was talking about.  Dr. Carlis Abbott, please advise on results.

## 2020-02-23 ENCOUNTER — Other Ambulatory Visit: Payer: Self-pay

## 2020-02-23 ENCOUNTER — Emergency Department (HOSPITAL_COMMUNITY): Payer: Medicare Other

## 2020-02-23 ENCOUNTER — Emergency Department (HOSPITAL_COMMUNITY)
Admission: EM | Admit: 2020-02-23 | Discharge: 2020-02-23 | Disposition: A | Discharge status: Home / Self Care | Payer: Medicare Other | Attending: Emergency Medicine | Admitting: Emergency Medicine

## 2020-02-23 DIAGNOSIS — J90 Pleural effusion, not elsewhere classified: Secondary | ICD-10-CM | POA: Diagnosis not present

## 2020-02-23 DIAGNOSIS — J9811 Atelectasis: Secondary | ICD-10-CM | POA: Diagnosis not present

## 2020-02-23 DIAGNOSIS — R0789 Other chest pain: Secondary | ICD-10-CM | POA: Insufficient documentation

## 2020-02-23 DIAGNOSIS — E119 Type 2 diabetes mellitus without complications: Secondary | ICD-10-CM | POA: Insufficient documentation

## 2020-02-23 DIAGNOSIS — Z79899 Other long term (current) drug therapy: Secondary | ICD-10-CM | POA: Diagnosis not present

## 2020-02-23 DIAGNOSIS — Z6835 Body mass index (BMI) 35.0-35.9, adult: Secondary | ICD-10-CM | POA: Insufficient documentation

## 2020-02-23 DIAGNOSIS — E669 Obesity, unspecified: Secondary | ICD-10-CM | POA: Insufficient documentation

## 2020-02-23 DIAGNOSIS — Z7901 Long term (current) use of anticoagulants: Secondary | ICD-10-CM | POA: Diagnosis not present

## 2020-02-23 DIAGNOSIS — I2510 Atherosclerotic heart disease of native coronary artery without angina pectoris: Secondary | ICD-10-CM | POA: Insufficient documentation

## 2020-02-23 DIAGNOSIS — I509 Heart failure, unspecified: Secondary | ICD-10-CM | POA: Diagnosis not present

## 2020-02-23 DIAGNOSIS — Z7984 Long term (current) use of oral hypoglycemic drugs: Secondary | ICD-10-CM | POA: Insufficient documentation

## 2020-02-23 DIAGNOSIS — I5031 Acute diastolic (congestive) heart failure: Secondary | ICD-10-CM | POA: Insufficient documentation

## 2020-02-23 DIAGNOSIS — I4891 Unspecified atrial fibrillation: Secondary | ICD-10-CM | POA: Insufficient documentation

## 2020-02-23 DIAGNOSIS — J449 Chronic obstructive pulmonary disease, unspecified: Secondary | ICD-10-CM | POA: Diagnosis not present

## 2020-02-23 DIAGNOSIS — I110 Hypertensive heart disease with heart failure: Secondary | ICD-10-CM | POA: Diagnosis not present

## 2020-02-23 DIAGNOSIS — R079 Chest pain, unspecified: Secondary | ICD-10-CM | POA: Diagnosis not present

## 2020-02-23 DIAGNOSIS — R0602 Shortness of breath: Secondary | ICD-10-CM | POA: Insufficient documentation

## 2020-02-23 LAB — TROPONIN I (HIGH SENSITIVITY)
Troponin I (High Sensitivity): 46 ng/L — ABNORMAL HIGH (ref <18)
Troponin I (High Sensitivity): 45 ng/L — ABNORMAL HIGH (ref <18)

## 2020-02-23 LAB — HEPATIC FUNCTION PANEL
ALT: 22 U/L (ref 0–44)
AST: 22 U/L (ref 15–41)
Albumin: 3.9 g/dL (ref 3.5–5.0)
Alkaline Phosphatase: 63 U/L (ref 38–126)
Bilirubin, Direct: 0.2 mg/dL (ref 0.0–0.2)
Indirect Bilirubin: 0.7 mg/dL (ref 0.3–0.9)
Total Bilirubin: 0.9 mg/dL (ref 0.3–1.2)
Total Protein: 7 g/dL (ref 6.5–8.1)

## 2020-02-23 LAB — BRAIN NATRIURETIC PEPTIDE: B Natriuretic Peptide: 143.2 pg/mL — ABNORMAL HIGH (ref 0.0–100.0)

## 2020-02-23 LAB — CBC
HCT: 40.2 % (ref 39.0–52.0)
Hemoglobin: 12.7 g/dL — ABNORMAL LOW (ref 13.0–17.0)
MCH: 31.2 pg (ref 26.0–34.0)
MCHC: 31.6 g/dL (ref 30.0–36.0)
MCV: 98.8 fL (ref 80.0–100.0)
Platelets: 207 10*3/uL (ref 150–400)
RBC: 4.07 MIL/uL — ABNORMAL LOW (ref 4.22–5.81)
RDW: 14.3 % (ref 11.5–15.5)
WBC: 7.3 10*3/uL (ref 4.0–10.5)
nRBC: 0 % (ref 0.0–0.2)

## 2020-02-23 LAB — BASIC METABOLIC PANEL
Anion gap: 9 (ref 5–15)
BUN: 11 mg/dL (ref 8–23)
CO2: 27 mmol/L (ref 22–32)
Calcium: 8.7 mg/dL — ABNORMAL LOW (ref 8.9–10.3)
Chloride: 105 mmol/L (ref 98–111)
Creatinine, Ser: 1.23 mg/dL (ref 0.61–1.24)
GFR calc Af Amer: >60 mL/min (ref >60)
GFR calc non Af Amer: >60 mL/min (ref >60)
Glucose, Bld: 162 mg/dL — ABNORMAL HIGH (ref 70–99)
Potassium: 3.5 mmol/L (ref 3.5–5.1)
Sodium: 141 mmol/L (ref 135–145)

## 2020-02-23 LAB — LIPASE, BLOOD: Lipase: 41 U/L (ref 11–51)

## 2020-02-23 MED ORDER — FUROSEMIDE 10 MG/ML IJ SOLN
40.0000 mg | Freq: Once | INTRAMUSCULAR | Status: AC
  Administered 2020-02-23: 40 mg via INTRAVENOUS
  Filled 2020-02-23: qty 4

## 2020-02-23 MED ORDER — SODIUM CHLORIDE 0.9% FLUSH
3.0000 mL | Freq: Once | INTRAVENOUS | Status: AC
  Administered 2020-02-23: 3 mL via INTRAVENOUS

## 2020-02-23 NOTE — ED Provider Notes (Signed)
[facility] EMERGENCY DEPARTMENT Provider Note   CSN: [id number] Arrival date & time: 02/23/20  5573     History Chief Complaint  Patient presents with  . Shortness of Breath  . Flank Pain    Juan Calderon is a 64 y.o. male.  HPI    Patient presents with dyspnea, left-sided chest pain. Pain is been present for at least the past few days, possibly longer, is present essentially always, but worse episodically.  Pain is sharp, severe, not noticeably changed with his home Percocet use. Patient has been seen and evaluated by his pulmonologist recently, has been speaking with his physicians, as he has had dyspnea for some time. He takes torsemide, 3 times weekly. No report of new fever, no vomiting, no abdominal pain.  He notes that his weight fluctuates, and it is currently going up.  Past Medical History:  Diagnosis Date  . Alcohol abuse    . Bradycardia   . C6 radiculopathy 01/24/2016   Right upper extremity, mild to moderate electrically by EMG on 01/24/2016  . Cataract    Left eye  . Chronic diastolic heart failure ([place])     with mild left ventricular hypertrophy on Echo 02/2010  . Chronic obstructive pulmonary disease ([place])    . Chronic osteomyelitis of femur ([place]) 04/06/2016  . Chronic osteomyelitis of left femur ([place]) 11/22/2017   Left femur s/p prior trauma  . Chronic osteomyelitis of left femur ([place]) 11/22/2017   Brodie's abscess: left femur s/p prior trauma.  Underwent partial excision and curettage of left femoral osteomyelitis at [facility] 12/30/2017 with grossly purulent material encountered within the medullary canal of the left distal femur.  Cultures grew MSSA.  Post-operatively received 6 weeks of IV antibiotics through 02/10/2018.  CRP elevated at 60.3 at end of IV antibiotic course so continued on Keflex  . Chronic pain syndrome     Left arm and leg s/p traumatic injury   . Chronic renal insufficiency    . Coronary artery disease     25% LAD stenosis on  cath 2007.  Stable angina.  . Diverticulosis    . Diverticulosis 11/12/2013  . Essential hypertension    . Frequent PVCs   . Gastroesophageal reflux disease    . Gout    . Hyperlipidemia LDL goal < 100    . Internal hemorrhoids without complication 22/10/5425  . Long-term current use of opiate analgesic 09/07/2016  . Mild carpal tunnel syndrome of right wrist 01/24/2016   Mild degree electrically per EMG 01/24/2016   . Mild carpal tunnel syndrome of right wrist 01/24/2016   Mild degree electrically per EMG 01/24/2016   . Morbid obesity with BMI of 40.0-44.9, adult ([place])    . Normocytic anemia    . NSVT (nonsustained ventricular tachycardia) ([place])   . Obstructive sleep apnea     Moderate, AHI 29.8 per hour with moderately loud snoring and oxygen desaturation to a nadir of 79%. CPAP titration resulted in a prescription for 17 CWP.    [name] Open-angle glaucoma    . Osteoarthritis cervical spine    . Osteoarthritis of left knee 06/19/2013   Tricompartmental disease.  Treated with double hinged upright knee brace, steroid/xylocaine knee injections, and NSAIDs   . Osteoporosis 05/14/2017   s/p fracture of the right humerus from a fall at ground hight  . Persistent atrial fibrillation ([place])   . Right rotator cuff tear     Large full-thickness tear of the supraspinatus with mild retraction  but no atrophy   . Right rotator cuff tear 04/25/2013   Large full-thickness tear of the supraspinatus with mild retraction but no atrophy    . Secondary male hypogonadism 02/07/2017   Likely secondary to chronic opioid use  . Secondary male hypogonadism 02/07/2017   Likely secondary to chronic opioid use  . Subclinical hypothyroidism    . Tubular adenoma of colon 11/22/2017   Specifics unknown.  Repeat colonoscopy 08/12/2018 with six 3-6 mm tubular adenomas removed endoscopically.  . 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 ([place])    . Vasomotor rhinitis 04/25/2013    Patient Active  Problem List   Diagnosis Date Noted  . A-fib ([place]) 12/12/2019  . Tubular adenoma of colon 11/22/2017  . Chronic use of opiate drug for therapeutic purpose 08/23/2017  . C6 radiculopathy 01/24/2016  . Paroxysmal atrial fibrillation ([place]) 10/25/2015  . Alcohol use disorder 02/28/2015  . Constipation due to opioid therapy 12/24/2014  . Post-traumatic osteoarthritis of left knee 06/19/2013  . Obstructive sleep apnea 06/01/2013  . Osteoarthritis cervical spine 04/25/2013  . Gastroesophageal reflux disease without esophagitis 04/25/2013  . Open-angle glaucoma 04/25/2013  . Hyperlipidemia 04/25/2013  . 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 ([place]) 04/25/2013  . Coronary artery disease involving native coronary artery with angina pectoris ([place]) 04/25/2013  . COPD (chronic obstructive pulmonary disease) ([place]) 04/25/2013  . Idiopathic chronic gout without tophus 04/25/2013  . Severe obesity with body mass index (BMI) of 35.0 to 39.9 with comorbidity ([place]) 04/25/2013  . Healthcare maintenance 01/15/2013  . Chronic diastolic heart failure ([place]) 02/04/2012  . Essential hypertension 09/20/2011    Past Surgical History:  Procedure Laterality Date  . A-FLUTTER ABLATION N/A 03/24/2019   Procedure: A-FLUTTER ABLATION;  Surgeon: [name], MD;  Location: [facility] CV LAB;  Service: Cardiovascular;  Laterality: N/A;  . CARDIOVERSION N/A 12/30/2014   Procedure: CARDIOVERSION;  Surgeon: [name], MD;  Location: [facility] ENDOSCOPY;  Service: Cardiovascular;  Laterality: N/A;  . FRACTURE SURGERY Left 1980's   [place]  . Left arm surgery    . Left leg surgery    . SHOULDER SURGERY     Right       Family History  Problem Relation Age of Onset  . Heart failure Mother   . Asthma Mother   . Alzheimer's disease Father   . Hypertension Sister   . Hypertension Sister   . Hypertension Sister   . Cancer Brother        unknown  . Early death Brother        Geographical information systems officer  . Cancer Brother        prostate  . Prostate cancer Brother   . Heart attack Neg Hx   . Stroke Neg Hx   . Colon cancer Neg Hx   . Esophageal cancer Neg Hx   . Pancreatic cancer Neg Hx   . Stomach cancer Neg Hx   . Liver disease Neg Hx     Social History   Tobacco Use  . Smoking status: Never Smoker  . Smokeless tobacco: Never Used  Vaping Use  . Vaping Use: Never used  Substance Use Topics  . Alcohol use: Yes    Alcohol/week: 14.0 standard drinks    Types: 14 Cans of beer per week  . Drug use: No    Home Medications Prior to Admission medications   Medication Sig Start Date End Date Taking? Authorizing Provider  albuterol (  VENTOLIN HFA) 108 (90 Base) MCG/ACT inhaler Inhale 1-2 puffs into the lungs every 6 (six) hours as needed for shortness of breath. Patient not taking: Reported on 02/04/2020 06/29/19   Axel Filler, MD  allopurinol (ZYLOPRIM) 300 MG tablet Take 1 tablet (300 mg total) by mouth daily. Please note the change in the number of tablets to take daily. 04/07/19   Axel Filler, MD  atorvastatin (LIPITOR) 20 MG tablet Take 1 tablet (20 mg total) by mouth daily. 05/21/19   Axel Filler, MD  Blood Glucose Monitoring Suppl [facility] VERIO) w/Device KIT 1 each by Does not apply route daily. 11/04/15   Loleta Chance, MD  ELIQUIS 5 MG TABS tablet TAKE 1 TABLET BY MOUTH TWICE A DAY 09/08/19   Axel Filler, MD  flecainide (TAMBOCOR) 100 MG tablet Take 1 tablet (100 mg total) by mouth every 12 (twelve) hours. 10/16/19   Axel Filler, MD  Fluticasone-Salmeterol (ADVAIR DISKUS) 250-50 MCG/DOSE AEPB Inhale 1 puff into the lungs 2 (two) times daily. 02/04/20   [name], DO  glucose blood (ONETOUCH VERIO) test strip Use as instructed 05/24/16   Oval Linsey, MD  latanoprost (XALATAN) 0.005 % ophthalmic solution Place 1 drop into both eyes at bedtime. 10/11/16   Oval Linsey, MD  magnesium oxide (MAG-OX) 400 MG tablet Take  400 mg by mouth 2 (two) times daily.    [provider]  metFORMIN (GLUCOPHAGE) 1000 MG tablet Take 1 tablet (1,000 mg total) by mouth 2 (two) times daily with a meal. 10/16/19   Axel Filler, MD  metoprolol tartrate (LOPRESSOR) 25 MG tablet Take 1 tablet (25 mg total) by mouth 2 (two) times daily. 04/20/19   Axel Filler, MD  nitroGLYCERIN (NITROSTAT) 0.4 MG SL tablet Place 1 tablet (0.4 mg total) under the tongue every 5 (five) minutes as needed for chest pain. 07/18/18   Oval Linsey, MD  [facility] DELICA LANCETS 70Y MISC Use 1 strip daily 05/24/16   Oval Linsey, MD  oxyCODONE-acetaminophen (PERCOCET) 10-325 MG tablet Take 1 tablet by mouth every 6 (six) hours as needed for pain. 02/03/20 02/02/21  Axel Filler, MD  pantoprazole (PROTONIX) 40 MG tablet Take 1 tablet (40 mg total) by mouth 2 (two) times daily. 12/31/19   Irene Shipper, MD  Sorbitol 70 % SOLN Take 15-60 mLs by mouth daily as needed. 12/28/19   Axel Filler, MD  SPIRIVA HANDIHALER 18 MCG inhalation capsule INHALE 1 CAPSULE VIA HANDIHALER ONCE DAILY AT THE SAME TIME EVERY DAY 02/02/20   Axel Filler, MD  terazosin (HYTRIN) 5 MG capsule Take 1 capsule (5 mg total) by mouth at bedtime. 05/06/19   Axel Filler, MD  torsemide (DEMADEX) 20 MG tablet TAKE 2 TABLETS BY MOUTH 3 TIMES PER WEEK 02/04/20   Axel Filler, MD    Allergies    Ramipril, Tape, and Testosterone  Review of Systems   Review of Systems  Constitutional:       Per HPI, otherwise negative  HENT:       Per HPI, otherwise negative  Respiratory:       Per HPI, otherwise negative  Cardiovascular:       Per HPI, otherwise negative  Gastrointestinal: Negative for vomiting.  Endocrine:       Negative aside from HPI  Genitourinary:       Neg aside from HPI   Musculoskeletal:       Per HPI, otherwise negative  Skin: Negative.   Neurological: Negative for syncope.    Physical Exam Updated Vital  Signs BP (!) 158/82 (BP Location: Right Arm)   Pulse (!) 56   Temp 98 F (36.7 C) (Oral)   Resp 20   SpO2 98%   Physical Exam Vitals and nursing note reviewed.  Constitutional:      General: He is not in acute distress.    Appearance: He is well-developed. He is obese.  HENT:     Head: Normocephalic and atraumatic.  Eyes:     Conjunctiva/sclera: Conjunctivae normal.  Cardiovascular:     Rate and Rhythm: Normal rate and regular rhythm.  Pulmonary:     Effort: Pulmonary effort is normal. No respiratory distress.     Breath sounds: No stridor. Decreased breath sounds present.  Abdominal:     General: There is no distension.  Skin:    General: Skin is warm and dry.     Comments: No obvious skin changes consistent with shingles  Neurological:     Mental Status: He is alert and oriented to person, place, and time.     ED Results / Procedures / Treatments   Labs (all labs ordered are listed, but only abnormal results are displayed) Labs Reviewed  BASIC METABOLIC PANEL - Abnormal; Notable for the following components:      Result Value   Glucose, Bld 162 (*)    Calcium 8.7 (*)    All other components within normal limits  CBC - Abnormal; Notable for the following components:   RBC 4.07 (*)    Hemoglobin 12.7 (*)    All other components within normal limits  TROPONIN I (HIGH SENSITIVITY) - Abnormal; Notable for the following components:   Troponin I (High Sensitivity) 46 (*)    All other components within normal limits  TROPONIN I (HIGH SENSITIVITY) - Abnormal; Notable for the following components:   Troponin I (High Sensitivity) 45 (*)    All other components within normal limits    EKG None  Radiology DG Chest 2 View  Result Date: 02/23/2020 CLINICAL DATA:  Chest pain extending to the back. Personal history of congestive heart failure, coronary artery disease, and diabetes. EXAM: CHEST - 2 VIEW COMPARISON:  Two-view chest x-ray 03/23/2019. FINDINGS: The heart is  enlarged. Mild edema is evident bilaterally. Small effusions are present. Bibasilar airspace opacities likely reflect atelectasis. No significant airspace consolidation is present. Axial skeleton is unremarkable. IMPRESSION: 1. Congestive heart failure. 2. Bibasilar airspace disease likely reflects atelectasis. Electronically Signed   By: San Morelle M.D.   On: 02/23/2020 04:25    Procedures Procedures (including critical care time)  Medications Ordered in ED Medications  sodium chloride flush (NS) 0.9 % injection 3 mL (has no administration in time range)    ED Course  I have reviewed the triage vital signs and the nursing notes.  Pertinent labs & imaging results that were available during my care of the patient were reviewed by me and considered in my medical decision making (see chart for details).     Notes from pulmonology visit 2 weeks ago as below: Chronic dyspnea on exertion for several years, likely multifactorial.  Chronic cardiac disease likely contributed, as have his obesity and deconditioning given the duration of his symptoms.  Given his occupational history, I am concerned for possible ILD related to his silica or asbestos exposure.  -HRCT chest   Possible asthma based on PFTs, although lack of response to LABA-ICS over time suggests against  this. -Discontinue Spiriva.  Start Advair 250-50 twice daily.  Instructed to rinse his mouth after every use.  With consideration of heart failure exacerbation versus ACS versus interstitial lung disease versus COPD exacerbation the patient had labs, x-ray ordered.  Given his description of weight gain, CHF, patient received IV Lasix here.  Update:, Patient has been ambulatory, without supplemental oxygen.   I discussed the patient's case with our hospital at home program, the patient is being enrolled will receive additional care at home.  4:17 PM Patient awake and alert, sitting upright, aware of all findings, including  concern for worsening heart failure, likely complicated by his history of COPD/ILD. Patient has no new oxygen requirement, though his troponin is slightly elevated, there is no appreciable delta, given the duration of symptoms, low suspicion for atypical ACS.  In addition patient has unremarkable EKG. Patient amenable to, appropriate for discharge with close outpatient follow-up.   MDM Rules/Calculators/A&P                          MDM Number of Diagnoses or Management Options SOB (shortness of breath): new, needed workup   Amount and/or Complexity of Data Reviewed Clinical lab tests: reviewed Tests in the radiology section of CPT: reviewed Tests in the medicine section of CPT: reviewed Decide to obtain previous medical records or to obtain history from someone other than the patient: yes Obtain history from someone other than the patient: yes Review and summarize past medical records: yes Discuss the patient with other providers: yes Independent visualization of images, tracings, or specimens: yes  Risk of Complications, Morbidity, and/or Mortality Presenting problems: high Diagnostic procedures: high Management options: high  Critical Care Total time providing critical care: < 30 minutes  Patient Progress Patient progress: stable  Final Clinical Impression(s) / ED Diagnoses Final diagnoses:  SOB (shortness of breath)     Carmin Muskrat, MD 02/23/20 1620

## 2020-02-23 NOTE — Discharge Instructions (Signed)
As discussed, you have been diagnosed with shortness of breath. Your progression of breathing difficulty is likely combination of your congestive heart failure and COPD.  Please be sure to follow-up with our hospital at home program, with whom you have spoken today in the emergency department. They will advise you on additional medication changes to improve your condition.  Return here for concerning changes in your condition.

## 2020-02-23 NOTE — Plan of Care (Signed)
[facility]  Consult Note  Chief Complaint: MSK pain, worsening dyspnea  History of Present Illness: Patient reports chronic dyspnea which has worsened over the last week.  He reports increased orthopnea but denies PND.  He has chronic diastolic chf, COPD and is being worked up for ILD with a recent HRCT scan which was inconclusive. He also presents with worsening left sided back/side pain.    Meds:  Current Facility-Administered Medications for the 02/23/20 encounter [facility] Encounter)  Medication  . 0.9 %  sodium chloride infusion   Current Meds  Medication Sig  . albuterol (VENTOLIN HFA) 108 (90 Base) MCG/ACT inhaler Inhale 1-2 puffs into the lungs every 6 (six) hours as needed for shortness of breath.  . allopurinol (ZYLOPRIM) 300 MG tablet Take 1 tablet (300 mg total) by mouth daily. Please note the change in the number of tablets to take daily.  [name] atorvastatin (LIPITOR) 20 MG tablet Take 1 tablet (20 mg total) by mouth daily.  [name] ELIQUIS 5 MG TABS tablet TAKE 1 TABLET BY MOUTH TWICE A DAY (Patient taking differently: Take 5 mg by mouth 2 (two) times daily. )  . flecainide (TAMBOCOR) 100 MG tablet Take 1 tablet (100 mg total) by mouth every 12 (twelve) hours.  . Fluticasone-Salmeterol (ADVAIR DISKUS) 250-50 MCG/DOSE AEPB Inhale 1 puff into the lungs 2 (two) times daily.  [name] latanoprost (XALATAN) 0.005 % ophthalmic solution Place 1 drop into both eyes at bedtime.  . magnesium oxide (MAG-OX) 400 MG tablet Take 400 mg by mouth daily.   . metFORMIN (GLUCOPHAGE) 1000 MG tablet Take 1 tablet (1,000 mg total) by mouth 2 (two) times daily with a meal.  . metoprolol tartrate (LOPRESSOR) 25 MG tablet Take 1 tablet (25 mg total) by mouth 2 (two) times daily.  . Olopatadine HCl 0.2 % SOLN Place 1 drop into both eyes daily.  [name] oxyCODONE-acetaminophen (PERCOCET) 10-325 MG tablet Take 1 tablet by mouth every 6 (six) hours as needed for pain.  . pantoprazole (PROTONIX) 40 MG tablet Take 1  tablet (40 mg total) by mouth 2 (two) times daily.  [name] Propylene Glycol (SYSTANE BALANCE OP) Place 1 drop into both eyes daily.  . Sorbitol 70 % SOLN Take 15-60 mLs by mouth daily as needed. (Patient taking differently: Take 15-60 mLs by mouth daily as needed (for constipation). )  . terazosin (HYTRIN) 5 MG capsule Take 1 capsule (5 mg total) by mouth at bedtime.  . torsemide (DEMADEX) 20 MG tablet TAKE 2 TABLETS BY MOUTH 3 TIMES PER WEEK (Patient taking differently: Take 40 mg by mouth every Monday, Wednesday, and Friday. )    Physical Exam: Blood pressure (!) 162/80, pulse 60, temperature 98.1 F (36.7 C), temperature source Oral, resp. rate 18, SpO2 98 %. Cardiac: JVP 10cm , positive hepatojugular reflex, normal rate and rhythm, clear s1 and s2, no murmurs, rubs or gallops, 1+ LE edema bilaterally Pulmonary: decreased bibasilar breath sounds, not in distress Abdominal:  soft and nontender MSK: extremely tender in left latissimus dorsi posterior lower ribs on palpation and with movement.   Psych: Alert, conversant, in good spirits   Clinical Screening: (ALL ANSWERS MUST BE NO)  Based on current presentation is the patient likely to require: advanced diagnostics, advanced imaging (CT, MRI, nuclear stress testing), cardiac catheterization, EGD/colonoscopy, or lab monitoring not amendable to home monitoring (troponin, >q12 hour labs): NO.  Based on current presentation is the patient is likely to require: mechanical ventilation (invasive and noninvasive, history of  intubation) and/or vasoactive medications: NO.  Based on current presentation is the patient likely to require a surgical or IR procedure including but not limited to intraabdominal abscess drainage, percutaneous nephrostomy tube placement, thoracentesis for parapneumonic effusion, surgical wound debridement: NO.  Based on current presentation is the patient is likely to require: daily specialty consultation, blood transfusions,  respiratory isolation/airborne precautions, active adjustments of opiates or IV pain medications: NO.  Does the patient have barriers that would make it unsafe to provide care in the home including but not limited to severe AMS, active substance use disorder, history of or high risk of noncompliance with primary treatment plan: NO.  Has the patient ever been intubated or do they have a new tracheostomy: NO.  Does the patient have an unstable arrhythmia: NO.  Is hemodialysis likely to be required (i.e. already on HD or newly anuric/sever ATN): NO.  Is there risk for inability to obtain IV access: NO.]  Social Screening: (ALL QUESTIONS MUST BE YES) Does the patient have a home recovery environment? YES.  Is the patient's home recovery environment in an eligible geography [facility])? YES.  Does the patient's home have water, electricity, bathroom, heat/ac, refrigerator? YES.  Does the patient feel safe at home? YES.  Are family/caregivers willing to participate, as needed, while the patient participates [facility].  Is there a person in the home (patient or other) willing/able to take vital signs and answer phone calls? YES.  Is the patient willing to put pets in a secure area while [facility] and affiliated staff are in the home? YES.  Is patient willing/able to participate in the [facility] (this includes [facility] affiliated staff entering the home, and associated services)? YES.  Is the patient/patient's HCP willing/able to sign consent? YES.  Assessment / Plan:   Worsening Dyspnea on exertion Acute on Chronic diastolic CHF  Likely the patient's dyspnea on exertion is multifactorial he is obese and deconditioned, he has significant lung disease as well as cardiac disease.  I do not appreciate much wheezing and he does not seem to respond well to bronchodilator therapy.  Today he is volume overloaded on examination.  BNP  elevated which may be partially suppressed by his body habitus, chest x-ray showing some edema and effusions.   He does not weigh himself at home.  He does not note any major changes at home as far as dietary/medication compliance.    -IV Lasix 60 twice daily, may only need 1 to 2 days, will try to give his second dose this evening -Observe home use of Advair Diskus and educate if needed -Daily BMP -Strict I's and O's and daily weights -Consider some type of exercise program pulmonary rehab versus cardiac rehab -Consider adding SGLT2 inhibitor and keeping torsemide at current dose vs altering torsemide maintenance dose -Continue following with pulmonology, PCP and cardiology  MSK pain  Reproducible on exam, no rib fracture seen on imaging.  Could represent worsening muscle strain or tension.  -Consider addition of muscle relaxer      Based on the HPI and information obtained the patient is a candidate for the [facility]. Consent has been signed and the patient has been provided with a copy.   The patient has been enrolled in the [facility] has been notified and will present to the patient's house at  02/23/20.   Home health / DME needs: bedside commode  Medication needs: IV lasix, possibly oral K  Other needs: none  Patient's contact information:  Phone: [place]  [place] [address]  Please do not hesitate to call with questions/concerns.   Katherine Roan, MD 02/23/2020, 4:40 PM  Pager: [phone]

## 2020-02-23 NOTE — ED Triage Notes (Signed)
Pt is here with left under chest area pain that radiates to back and then down to buttock area, hurts with movement and deep breath.  Patient also states it wraps around to his right under chest area.

## 2020-02-25 DIAGNOSIS — Z7689 Persons encountering health services in other specified circumstances: Secondary | ICD-10-CM | POA: Diagnosis not present

## 2020-02-26 DIAGNOSIS — I5032 Chronic diastolic (congestive) heart failure: Secondary | ICD-10-CM | POA: Diagnosis not present

## 2020-02-29 DIAGNOSIS — I4891 Unspecified atrial fibrillation: Secondary | ICD-10-CM | POA: Diagnosis not present

## 2020-02-29 DIAGNOSIS — I5032 Chronic diastolic (congestive) heart failure: Secondary | ICD-10-CM | POA: Diagnosis not present

## 2020-03-03 ENCOUNTER — Other Ambulatory Visit: Payer: Self-pay | Admitting: Student in an Organized Health Care Education/Training Program

## 2020-03-03 MED ORDER — OXYCODONE-ACETAMINOPHEN 10-325 MG PO TABS: 1.0000 | ORAL_TABLET | Freq: Four times a day (QID) | ORAL | 0 refills | Status: DC | PRN

## 2020-03-04 ENCOUNTER — Other Ambulatory Visit: Payer: Self-pay | Admitting: Student in an Organized Health Care Education/Training Program

## 2020-03-04 DIAGNOSIS — M1A00X0 Idiopathic chronic gout, unspecified site, without tophus (tophi): Secondary | ICD-10-CM

## 2020-03-14 ENCOUNTER — Encounter: Payer: Self-pay | Admitting: Student in an Organized Health Care Education/Training Program

## 2020-03-14 ENCOUNTER — Ambulatory Visit: Payer: Medicare Other | Admitting: Student in an Organized Health Care Education/Training Program

## 2020-03-14 VITALS — BP 139/78 | HR 56 | Temp 98.1°F | Ht 72.0 in | Wt 313.4 lb

## 2020-03-14 DIAGNOSIS — I10 Essential (primary) hypertension: Secondary | ICD-10-CM

## 2020-03-14 DIAGNOSIS — Z79891 Long term (current) use of opiate analgesic: Secondary | ICD-10-CM

## 2020-03-14 DIAGNOSIS — G4733 Obstructive sleep apnea (adult) (pediatric): Secondary | ICD-10-CM

## 2020-03-14 DIAGNOSIS — E1140 Type 2 diabetes mellitus with diabetic neuropathy, unspecified: Secondary | ICD-10-CM

## 2020-03-14 DIAGNOSIS — I480 Paroxysmal atrial fibrillation: Secondary | ICD-10-CM

## 2020-03-14 DIAGNOSIS — I5032 Chronic diastolic (congestive) heart failure: Secondary | ICD-10-CM

## 2020-03-14 LAB — POCT GLYCOSYLATED HEMOGLOBIN (HGB A1C): Hemoglobin A1C: 6.8 % — AB (ref 4.0–5.6)

## 2020-03-14 LAB — GLUCOSE, CAPILLARY: Glucose-Capillary: 211 mg/dL — ABNORMAL HIGH (ref 70–99)

## 2020-03-14 MED ORDER — OXYCODONE-ACETAMINOPHEN 10-325 MG PO TABS: 1.0000 | ORAL_TABLET | Freq: Four times a day (QID) | ORAL | 0 refills | Status: DC | PRN

## 2020-03-14 MED ORDER — TORSEMIDE 20 MG PO TABS: 20.0000 mg | ORAL_TABLET | Freq: Every day | ORAL | 3 refills | Status: DC

## 2020-03-14 NOTE — Assessment & Plan Note (Signed)
Hemoglobin A1c 6.8%, at goal.  Plan to continue with Metformin 1000 mg twice daily.  Plan to continue with atorvastatin for primary prevention of ischemic vascular disease.  Patient declined foot exam today.  No history of microalbuminuria but need to recheck at next visit.

## 2020-03-14 NOTE — Progress Notes (Signed)
   Assessment and Plan:  See Encounters tab for problem-based medical decision making.   __________________________________________________________  HPI:   64 year old person living with morbid obesity, heart failure with preserved ejection fraction, COPD here for follow-up of diabetes and hypertension.  Reports feeling stable today.  Continues to have significant shortness of breath that limits his physical activity most days.  He came to our clinic in a wheelchair today, unable to walk the hall down to the [facility].  Is able to complete all of his activities of daily living, as well as activities like grocery shopping.  Does not exercise, limited by low back and knee pain.  No chest pain, no dyspnea at rest recently.  Did have an emergency department visit in late June for worsening dyspnea.  He reported that he had a little bit of fluid on him and he was referred to the heart failure at home program which increased his diuretic dose to every day instead of every other day.  Says he feels much better than he did when he went to the emergency department.  No fevers or chills.  Good adherence with his medications.  Reports his mood is stable, no depression.  No recent falls.  __________________________________________________________  Problem List: Patient Active Problem List   Diagnosis Date Noted  . Chronic use of opiate drug for therapeutic purpose 08/23/2017    Priority: High  . Paroxysmal atrial fibrillation (HCC) 10/25/2015    Priority: High  . 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 ([place]) 04/25/2013    Priority: High  . Severe obesity with body mass index (BMI) of 35.0 to 39.9 with comorbidity ([place]) 04/25/2013    Priority: High  . Alcohol use disorder 02/28/2015    Priority: Medium  . Obstructive sleep apnea 06/01/2013    Priority: Medium  . Osteoarthritis cervical spine 04/25/2013    Priority: Medium  . Hyperlipidemia 04/25/2013    Priority: Medium    . Coronary artery disease involving native coronary artery with angina pectoris ([place]) 04/25/2013    Priority: Medium  . COPD (chronic obstructive pulmonary disease) ([place]) 04/25/2013    Priority: Medium  . Chronic diastolic heart failure ([place]) 02/04/2012    Priority: Medium  . Essential hypertension 09/20/2011    Priority: Medium  . Tubular adenoma of colon 11/22/2017    Priority: Low  . C6 radiculopathy 01/24/2016    Priority: Low  . Constipation due to opioid therapy 12/24/2014    Priority: Low  . Post-traumatic osteoarthritis of left knee 06/19/2013    Priority: Low  . Gastroesophageal reflux disease without esophagitis 04/25/2013    Priority: Low  . Open-angle glaucoma 04/25/2013    Priority: Low  . Idiopathic chronic gout without tophus 04/25/2013    Priority: Low  . Healthcare maintenance 01/15/2013    Priority: Low    Medications: Reconciled today in Epic __________________________________________________________  Physical Exam:  Vital Signs: Vitals:   03/14/20 0925  BP: 139/78  Pulse: (!) 56  Temp: 98.1 F (36.7 C)  TempSrc: Oral  SpO2: 96%  Weight: (!) 313 lb 6.4 oz (142.2 kg)  Height: 6' (1.829 m)    Gen: Well appearing, NAD Neck: No cervical LAD, No thyromegaly or nodules CV: RRR, 2 out of 6 early systolic murmur at the left upper sternal border Pulm: Normal effort, CTA throughout, no wheezing Ext: Warm, chronic 1+ nonpitting edema bilateral lower extremities

## 2020-03-14 NOTE — Assessment & Plan Note (Signed)
Osteoarthritis of the lower back and bilateral knees are chronic pain generators.  Doing well on oxycodone 10 mg taking about 3 tablets daily.  I reviewed the database today which was appropriate.  Last tox assure in April was appropriate.  No falls or other adverse effects.  He reports the medicine is helpful in improving his functional status.  Plan to continue with 100 tablets/month.  I sent a 1 month well into his pharmacy which should get him through August.

## 2020-03-14 NOTE — Assessment & Plan Note (Signed)
Chronic heart failure with preserved ejection fraction.  1 visit to the emergency department in late June for dyspnea at rest, now improved with escalated dose of torsemide after course of the heart failure at home program.  Plan to continue with torsemide 20 mg once daily.  NYHA class II-III symptoms, mix of lung disease and heart disease likely leading to the symptoms.  May consider SGLT2 inhibitor at next visit given some suggestive benefits in this heart failure population.

## 2020-03-14 NOTE — Assessment & Plan Note (Signed)
Blood pressure historically well controlled without dedicated antihypertensive, blood pressure little elevated today.  I think if this continues to be elevated we should start an ACE inhibitor given the comorbid diabetes.  We will follow-up at next visit.

## 2020-03-14 NOTE — Assessment & Plan Note (Signed)
Paroxysmal atrial fibrillation seems stable, history of atrial flutter treated with cardioversion does not seem to have recurred.  No palpitations or other symptoms.  Doing well on anticoagulation with apixaban 5 mg twice daily, no adverse bleeding events.  We will continue with flecainide 100 mg every 12 hours for rhythm control.  He will follow up with cardiology around October.

## 2020-03-14 NOTE — Assessment & Plan Note (Signed)
Moderate obstructive sleep apnea continues to go untreated as he has been intolerant of CPAP mask.  Was interested in a mouthpiece, but follow-up for this issue seems to have been lost.  I advised that he discuss this with the pulmonology physician tomorrow at his follow-up appointment.  Given the combination of his heart failure with preserved EF and COPD and morbid obesity, I think management of the obstructive sleep apnea will be important for his long-term functional status.

## 2020-03-15 ENCOUNTER — Ambulatory Visit (INDEPENDENT_AMBULATORY_CARE_PROVIDER_SITE_OTHER): Payer: Medicare Other | Admitting: Critical Care Medicine

## 2020-03-15 ENCOUNTER — Other Ambulatory Visit: Payer: Self-pay

## 2020-03-15 ENCOUNTER — Encounter: Payer: Self-pay | Admitting: Critical Care Medicine

## 2020-03-15 DIAGNOSIS — G4733 Obstructive sleep apnea (adult) (pediatric): Secondary | ICD-10-CM | POA: Diagnosis not present

## 2020-03-15 DIAGNOSIS — R0609 Other forms of dyspnea: Secondary | ICD-10-CM

## 2020-03-15 DIAGNOSIS — R0600 Dyspnea, unspecified: Secondary | ICD-10-CM

## 2020-03-15 NOTE — Patient Instructions (Addendum)
Thank you for visiting Dr. Carlis Abbott at [facility]. We recommend the following: Orders Placed This Encounter  Procedures  . Ambulatory referral to Pulmonology   Orders Placed This Encounter  Procedures  . Ambulatory referral to Pulmonology    Referral Priority:   Routine    Referral Type:   Consultation    Referral Reason:   Specialty Services Required    Requested Specialty:   Pulmonary Disease    Number of Visits Requested:   1   Stop taking Advair. Unless your symptoms get worse when you stop it, you do not need to be on any inhalers.    Return in about 4 weeks (around 04/12/2020). with sleep medicine doctor- Drs. Arn Medal, [place], [place] in 30 minute slot..    Please do your part to reduce the spread of COVID-19.

## 2020-03-15 NOTE — Progress Notes (Signed)
Synopsis: Referred in June 2021 for chronic bronchitis by Axel Filler,*.  Subjective:   PATIENT ID: Juan Calderon GENDER: male DOB: [birth year], MRN: [id number]  Chief Complaint  Patient presents with  . Follow-up    Patient feels the same since last visit. Was in the ED on 6/22 for shortness of breath. It is worse with exertion. Denies cough    Juan Calderon is a 64 year old gentleman with a history of chronic dyspnea on exertion and HFpEF.  At his last visit he was switched from Spiriva to Advair after his PFTs did not show obstruction.  Since his last visit he has had no change in symptoms-he remains short of breath.  No cough.  He had an ED visit on 6/22 for shortness of breath, when his diuretics were increased from 3 days/week to daily.  He has not noticed improvement in his breathing since that time.  His primary care physician is concerned about his untreated sleep apnea contributing to his shortness of breath.  He was diagnosed by PSG in 2014 (AHI 59--> 13 on PAP during titration study), but he has not ever been able to use CPAP.  He attributes this to sleeping face down.  He has a mouth and nose breather and does not know that he would benefit from nasal pillows.  He thinks he has gained weight since his study thousand 14.  He was fitted for an oral appliance to treat sleep apnea since his study was done, but never picked it up.  He has not been seen by sleep medicine doctor.    OV 02/04/20: [name] is a 64 y/o gentleman who presents for evaluation of chronic shortness of breath.  This has been progressively worsening over several years.  He has no shortness of breath at rest.  He has previously been evaluated by cardiology but was felt that his dyspnea is not fully explained by cardiac disease.  He has been on Spiriva for about 6 months and has used albuterol as needed for years without noticeable improvement.  Nothing improves his symptoms, but his symptoms are worse with  walking, bending over, and around strong chemical smells.  He has occasional wheezing, cough, but no sputum production.  He has seasonal allergies with eye watering, but no sinus symptoms.  He has a former infrequent smoker-pipes, cigars, occasionally cigarettes, but he quit many years ago.  He has a family history of asthma in his mother.  Medical history is notable for OSA, which was diagnosed in 2014.  He was previously managed by Dr. Annamaria Boots.  He has a history of atrial fibrillation, for which he takes chronic flecainide.  He is disabled due to a leg injury many years ago, but formerly worked as a Horticulturist, commercial.  He has been around bricks being cut and thinks he has been around silica and asbestos.   Past Medical History:  Diagnosis Date  . Alcohol abuse    . Bradycardia   . C6 radiculopathy 01/24/2016   Right upper extremity, mild to moderate electrically by EMG on 01/24/2016  . Cataract    Left eye  . Chronic diastolic heart failure ([place])     with mild left ventricular hypertrophy on Echo 02/2010  . Chronic obstructive pulmonary disease ([place])    . Chronic osteomyelitis of femur ([place]) 04/06/2016  . Chronic osteomyelitis of left femur ([place]) 11/22/2017   Left femur s/p prior trauma  . Chronic osteomyelitis of left femur ([place]) 11/22/2017   Brodie's abscess:  left femur s/p prior trauma.  Underwent partial excision and curettage of left femoral osteomyelitis at [facility] 12/30/2017 with grossly purulent material encountered within the medullary canal of the left distal femur.  Cultures grew MSSA.  Post-operatively received 6 weeks of IV antibiotics through 02/10/2018.  CRP elevated at 60.3 at end of IV antibiotic course so continued on Keflex  . Chronic pain syndrome     Left arm and leg s/p traumatic injury   . Chronic renal insufficiency    . Coronary artery disease     25% LAD stenosis on cath 2007.  Stable angina.  . Diverticulosis    . Diverticulosis 11/12/2013  . Essential hypertension    . Frequent  PVCs   . Gastroesophageal reflux disease    . Gout    . Hyperlipidemia LDL goal < 100    . Internal hemorrhoids without complication 50/27/7412  . Long-term current use of opiate analgesic 09/07/2016  . Mild carpal tunnel syndrome of right wrist 01/24/2016   Mild degree electrically per EMG 01/24/2016   . Mild carpal tunnel syndrome of right wrist 01/24/2016   Mild degree electrically per EMG 01/24/2016   . Morbid obesity with BMI of 40.0-44.9, adult ([place])    . Normocytic anemia    . NSVT (nonsustained ventricular tachycardia) ([place])   . Obstructive sleep apnea     Moderate, AHI 29.8 per hour with moderately loud snoring and oxygen desaturation to a nadir of 79%. CPAP titration resulted in a prescription for 17 CWP.    [name] Open-angle glaucoma    . Osteoarthritis cervical spine    . Osteoarthritis of left knee 06/19/2013   Tricompartmental disease.  Treated with double hinged upright knee brace, steroid/xylocaine knee injections, and NSAIDs   . Osteoporosis 05/14/2017   s/p fracture of the right humerus from a fall at ground hight  . Persistent atrial fibrillation ([place])   . Right rotator cuff tear     Large full-thickness tear of the supraspinatus with mild retraction but no atrophy   . Right rotator cuff tear 04/25/2013   Large full-thickness tear of the supraspinatus with mild retraction but no atrophy    . Secondary male hypogonadism 02/07/2017   Likely secondary to chronic opioid use  . Secondary male hypogonadism 02/07/2017   Likely secondary to chronic opioid use  . Subclinical hypothyroidism    . Tubular adenoma of colon 11/22/2017   Specifics unknown.  Repeat colonoscopy 08/12/2018 with six 3-6 mm tubular adenomas removed endoscopically.  . 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 ([place])    . Vasomotor rhinitis 04/25/2013     Family History  Problem Relation Age of Onset  . Heart failure Mother   . Asthma Mother   . Alzheimer's disease Father   .  Hypertension Sister   . Hypertension Sister   . Hypertension Sister   . Cancer Brother        unknown  . Early death Brother        Manufacturing systems engineer  . Cancer Brother        prostate  . Prostate cancer Brother   . Heart attack Neg Hx   . Stroke Neg Hx   . Colon cancer Neg Hx   . Esophageal cancer Neg Hx   . Pancreatic cancer Neg Hx   . Stomach cancer Neg Hx   . Liver disease Neg Hx      Past Surgical History:  Procedure Laterality Date  . A-FLUTTER ABLATION  N/A 03/24/2019   Procedure: A-FLUTTER ABLATION;  Surgeon: [name], MD;  Location: [facility];  Service: Cardiovascular;  Laterality: N/A;  . CARDIOVERSION N/A 12/30/2014   Procedure: CARDIOVERSION;  Surgeon: [name], MD;  Location: [facility] ENDOSCOPY;  Service: Cardiovascular;  Laterality: N/A;  . FRACTURE SURGERY Left 1980's   Elbow  . Left arm surgery    . Left leg surgery    . SHOULDER SURGERY     Right    Social History   Socioeconomic History  . Marital status: Widowed    Spouse name: Not on file  . Number of children: Not on file  . Years of education: Not on file  . Highest education level: Not on file  Occupational History  . Occupation: Disabled  Tobacco Use  . Smoking status: Never Smoker  . Smokeless tobacco: Never Used  Vaping Use  . Vaping Use: Never used  Substance and Sexual Activity  . Alcohol use: Yes    Alcohol/week: 14.0 standard drinks    Types: 14 Cans of beer per week  . Drug use: No  . Sexual activity: Not Currently  Other Topics Concern  . Not on file  Social History Narrative   Current Social History 07/01/2019        Patient lives with friends in a home which is 1 story. There are 4 steps with handrails up to the entrance the patient uses.       Patient's method of transportation is personal car.      The highest level of education was high school diploma.      The patient currently disabled.      Identified important Relationships are "My son and my sister"        Pets : None       Interests / Fun: "Watch TV"       Current Stressors: "Not being able to get around like I used to."       Religious / Personal Beliefs: Baptist       L. [place]atte, BSN, RN-BC       Social Determinants of Health   Financial Resource Strain:   . Difficulty of Paying Living Expenses:   Food Insecurity:   . Worried About Charity fundraiser in the Last Year:   . Arboriculturist in the Last Year:   Transportation Needs:   . Film/video editor (Medical):   [name] Lack of Transportation (Non-Medical):   Physical Activity:   . Days of Exercise per Week:   . Minutes of Exercise per Session:   Stress:   . Feeling of Stress :   Social Connections:   . Frequency of Communication with Friends and Family:   . Frequency of Social Gatherings with Friends and Family:   . Attends Religious Services:   . Active Member of Clubs or Organizations:   . Attends Archivist Meetings:   [name] Marital Status:   Intimate Partner Violence:   . Fear of Current or Ex-Partner:   . Emotionally Abused:   [name] Physically Abused:   . Sexually Abused:      Allergies  Allergen Reactions  . Ramipril Swelling    Facial swelling  . Tape Other (See Comments)    Irritates skin/ pls use paper tape  . Testosterone Rash     Immunization History  Administered Date(s) Administered  . Influenza,inj,Quad PF,6+ Mos 06/19/2013, 05/13/2014, 05/24/2016, 05/24/2017, 06/13/2018, 06/29/2019  . PFIZER SARS-COV-2 Vaccination  10/17/2019, 11/11/2019  . Pneumococcal Polysaccharide-23 08/07/2013  . Tdap 06/19/2013    Outpatient Medications Prior to Visit  Medication Sig Dispense Refill  . albuterol (VENTOLIN HFA) 108 (90 Base) MCG/ACT inhaler Inhale 1-2 puffs into the lungs every 6 (six) hours as needed for shortness of breath. 18 g 5  . allopurinol (ZYLOPRIM) 300 MG tablet TAKE 1 TABLET (300 MG TOTAL) BY MOUTH DAILY. PLEASE NOTE THE CHANGE IN THE NUMBER OF TABLETS TO TAKE DAILY. 30 tablet 11    . atorvastatin (LIPITOR) 20 MG tablet Take 1 tablet (20 mg total) by mouth daily. 90 tablet 3  . Blood Glucose Monitoring Suppl (ONETOUCH VERIO) w/Device KIT 1 each by Does not apply route daily. 1 kit 0  . ELIQUIS 5 MG TABS tablet TAKE 1 TABLET BY MOUTH TWICE A DAY (Patient taking differently: Take 5 mg by mouth 2 (two) times daily. ) 60 tablet 9  . flecainide (TAMBOCOR) 100 MG tablet Take 1 tablet (100 mg total) by mouth every 12 (twelve) hours. 180 tablet 3  . Fluticasone-Salmeterol (ADVAIR DISKUS) 250-50 MCG/DOSE AEPB Inhale 1 puff into the lungs 2 (two) times daily. 60 each 11  . glucose blood (ONETOUCH VERIO) test strip Use as instructed 100 each 12  . latanoprost (XALATAN) 0.005 % ophthalmic solution Place 1 drop into both eyes at bedtime. 7.5 mL 2  . magnesium oxide (MAG-OX) 400 MG tablet Take 400 mg by mouth daily.     . metFORMIN (GLUCOPHAGE) 1000 MG tablet Take 1 tablet (1,000 mg total) by mouth 2 (two) times daily with a meal. 180 tablet 3  . metoprolol tartrate (LOPRESSOR) 25 MG tablet Take 1 tablet (25 mg total) by mouth 2 (two) times daily. 180 tablet 3  . nitroGLYCERIN (NITROSTAT) 0.4 MG SL tablet Place 1 tablet (0.4 mg total) under the tongue every 5 (five) minutes as needed for chest pain. 25 tablet 1  . Olopatadine HCl 0.2 % SOLN Place 1 drop into both eyes daily.    Glory Rosebush DELICA LANCETS 51Z MISC Use 1 strip daily 100 each 5  . oxyCODONE-acetaminophen (PERCOCET) 10-325 MG tablet Take 1 tablet by mouth every 6 (six) hours as needed for pain. 100 tablet 0  . pantoprazole (PROTONIX) 40 MG tablet Take 1 tablet (40 mg total) by mouth 2 (two) times daily. 180 tablet 3  . Propylene Glycol (SYSTANE BALANCE OP) Place 1 drop into both eyes daily.    . Sorbitol 70 % SOLN Take 15-60 mLs by mouth daily as needed. (Patient taking differently: Take 15-60 mLs by mouth daily as needed (for constipation). ) 473 mL 11  . terazosin (HYTRIN) 5 MG capsule Take 1 capsule (5 mg total) by mouth at  bedtime. 90 capsule 3  . torsemide (DEMADEX) 20 MG tablet Take 1 tablet (20 mg total) by mouth daily. 90 tablet 3   No facility-administered medications prior to visit.    Review of Systems  Constitutional: Negative for chills and fever.  Respiratory: Positive for cough, shortness of breath and wheezing.   Cardiovascular: Negative for chest pain and leg swelling.  Skin: Negative for rash.  Endo/Heme/Allergies: Positive for environmental allergies.     Objective:   Vitals:   03/15/20 1118  BP: 140/88  Pulse: 64  Temp: 98.3 F (36.8 C)  TempSrc: Oral  SpO2: 92%  Weight: (!) 310 lb 9.6 oz (140.9 kg)  Height: 6' (1.829 m)   92% on  RA BMI Readings from Last 3 Encounters:  03/15/20 42.12  kg/m  03/14/20 42.50 kg/m  02/04/20 42.69 kg/m   Wt Readings from Last 3 Encounters:  03/15/20 (!) 310 lb 9.6 oz (140.9 kg)  03/14/20 (!) 313 lb 6.4 oz (142.2 kg)  02/04/20 (!) 314 lb 12.8 oz (142.8 kg)    Physical Exam Vitals reviewed.  Constitutional:      General: He is not in acute distress.    Appearance: He is obese. He is not ill-appearing.  HENT:     Head: Normocephalic and atraumatic.  Eyes:     General: No scleral icterus. Cardiovascular:     Rate and Rhythm: Regular rhythm.     Heart sounds: No murmur heard.   Pulmonary:     Comments: Sitting comfortably in room air, no conversational dyspnea.  Reduced basilar breath sounds. Abdominal:     Palpations: Abdomen is soft.     Tenderness: There is no abdominal tenderness.  Musculoskeletal:        General: Swelling present. No deformity.     Cervical back: Neck supple.  Lymphadenopathy:     Cervical: No cervical adenopathy.  Skin:    General: Skin is dry.     Findings: No rash.  Neurological:     General: No focal deficit present.     Mental Status: He is alert.     Coordination: Coordination normal.  Psychiatric:        Mood and Affect: Mood normal.        Behavior: Behavior normal.      CBC      Component Value Date/Time   WBC 7.3 02/23/2020 0354   RBC 4.07 (L) 02/23/2020 0354   HGB 12.7 (L) 02/23/2020 0354   HGB 13.5 12/07/2019 1029   HCT 40.2 02/23/2020 0354   HCT 39.6 12/07/2019 1029   PLT 207 02/23/2020 0354   PLT 178 12/07/2019 1029   MCV 98.8 02/23/2020 0354   MCV 94 12/07/2019 1029   MCH 31.2 02/23/2020 0354   MCHC 31.6 02/23/2020 0354   RDW 14.3 02/23/2020 0354   RDW 14.0 12/07/2019 1029   LYMPHSABS 1.3 03/23/2019 1030   LYMPHSABS 1.7 12/05/2016 1320   MONOABS 0.4 03/23/2019 1030   EOSABS 0.1 03/23/2019 1030   EOSABS 0.1 12/05/2016 1320   BASOSABS 0.0 03/23/2019 1030   BASOSABS 0.0 12/05/2016 1320    CHEMISTRY No results for input(s): NA, K, CL, CO2, GLUCOSE, BUN, CREATININE, CALCIUM, MG, PHOS in the last 168 hours. CrCl cannot be calculated (Patient's most recent lab result is older than the maximum 21 days allowed.).   Chest Imaging- films reviewed: CXR, 2 view 03/23/2019-increased lung markings bilaterally centrally and in the bases suspicious for pulmonary edema.  Increased vascular pedicle.  HRCT chest 02/17/2020- bibasilar linear scars, no ILD.  Cardiomegaly with dilated PA.  No pleural abnormalities.  No adenopathy.  Pulmonary Functions Testing Results: PFT Results Latest Ref Rng & Units 12/18/2019 08/07/2013  FVC-Pre L 1.86 2.55  FVC-Predicted Pre % 43 63  FVC-Post L 1.97 2.49  FVC-Predicted Post % 46 61  Pre FEV1/FVC % % 76 69  Post FEV1/FCV % % 75 75  FEV1-Pre L 1.41 1.76  FEV1-Predicted Pre % 43 55  FEV1-Post L 1.48 1.85  DLCO UNC% % 46 58  DLCO COR %Predicted % 100 105  TLC L 5.00 4.83  TLC % Predicted % 67 69  RV % Predicted % 116 115   2021-no significant obstruction or bronchodilator reversibility.  Mild restriction, moderate diffusion impairment.  Severely reduced FEF 25-75  a significant bronchodilator reversibility and high airway resistance suggesting small airways disease.   Echocardiogram 03/24/2019: LVEF 60%, moderate LVH.   Unable to assess diastolic function due to atrial flutter.  Normal RV size with reduced RV function.  Mild to moderately dilated LA.  Normal valves.     Assessment & Plan:     ICD-10-CM   1. Morbid obesity due to excess calories (HCC)  E66.01   2. OSA (obstructive sleep apnea)  G47.33   3. DOE (dyspnea on exertion)  R06.00     Chronic dyspnea on exertion for several years, likely multifactoial.  He has obesity related restriction.  He also has chronic HFpEF contributing as well as untreated OSA.  Based on dilated PA on CT scan, I suspect he has [place].  He does not have ILD or COPD. -HRCT chest reviewed during the visit.  No indication for follow-up imaging. -Walk in the office to ensure no significant desaturations (remained >94%)  No obstructive lung disease on PFTs, no benefit from LABA-ICS or LAMA inhalers. -Discontinue inhalers.  If he has worsening symptoms at discontinuation, can restart Advair.  OSA, at risk for OHS, morbid obesity -Discussed the risk of untreated OSA.  He needs to establish with a sleep medicine doctor. -Recommend weight loss as a long-term goal   Follow-up with Dr. Trinidad Curet, or [place] to establish care for sleep medicine.    Current Outpatient Medications:  .  albuterol (VENTOLIN HFA) 108 (90 Base) MCG/ACT inhaler, Inhale 1-2 puffs into the lungs every 6 (six) hours as needed for shortness of breath., Disp: 18 g, Rfl: 5 .  allopurinol (ZYLOPRIM) 300 MG tablet, TAKE 1 TABLET (300 MG TOTAL) BY MOUTH DAILY. PLEASE NOTE THE CHANGE IN THE NUMBER OF TABLETS TO TAKE DAILY., Disp: 30 tablet, Rfl: 11 .  atorvastatin (LIPITOR) 20 MG tablet, Take 1 tablet (20 mg total) by mouth daily., Disp: 90 tablet, Rfl: 3 .  Blood Glucose Monitoring Suppl (ONETOUCH VERIO) w/Device KIT, 1 each by Does not apply route daily., Disp: 1 kit, Rfl: 0 .  ELIQUIS 5 MG TABS tablet, TAKE 1 TABLET BY MOUTH TWICE A DAY (Patient taking differently: Take 5 mg by mouth 2 (two) times daily.  ), Disp: 60 tablet, Rfl: 9 .  flecainide (TAMBOCOR) 100 MG tablet, Take 1 tablet (100 mg total) by mouth every 12 (twelve) hours., Disp: 180 tablet, Rfl: 3 .  Fluticasone-Salmeterol (ADVAIR DISKUS) 250-50 MCG/DOSE AEPB, Inhale 1 puff into the lungs 2 (two) times daily., Disp: 60 each, Rfl: 11 .  glucose blood (ONETOUCH VERIO) test strip, Use as instructed, Disp: 100 each, Rfl: 12 .  latanoprost (XALATAN) 0.005 % ophthalmic solution, Place 1 drop into both eyes at bedtime., Disp: 7.5 mL, Rfl: 2 .  magnesium oxide (MAG-OX) 400 MG tablet, Take 400 mg by mouth daily. , Disp: , Rfl:  .  metFORMIN (GLUCOPHAGE) 1000 MG tablet, Take 1 tablet (1,000 mg total) by mouth 2 (two) times daily with a meal., Disp: 180 tablet, Rfl: 3 .  metoprolol tartrate (LOPRESSOR) 25 MG tablet, Take 1 tablet (25 mg total) by mouth 2 (two) times daily., Disp: 180 tablet, Rfl: 3 .  nitroGLYCERIN (NITROSTAT) 0.4 MG SL tablet, Place 1 tablet (0.4 mg total) under the tongue every 5 (five) minutes as needed for chest pain., Disp: 25 tablet, Rfl: 1 .  Olopatadine HCl 0.2 % SOLN, Place 1 drop into both eyes daily., Disp: , Rfl:  .  ONETOUCH DELICA LANCETS 50N MISC,  Use 1 strip daily, Disp: 100 each, Rfl: 5 .  oxyCODONE-acetaminophen (PERCOCET) 10-325 MG tablet, Take 1 tablet by mouth every 6 (six) hours as needed for pain., Disp: 100 tablet, Rfl: 0 .  pantoprazole (PROTONIX) 40 MG tablet, Take 1 tablet (40 mg total) by mouth 2 (two) times daily., Disp: 180 tablet, Rfl: 3 .  Propylene Glycol (SYSTANE BALANCE OP), Place 1 drop into both eyes daily., Disp: , Rfl:  .  Sorbitol 70 % SOLN, Take 15-60 mLs by mouth daily as needed. (Patient taking differently: Take 15-60 mLs by mouth daily as needed (for constipation). ), Disp: 473 mL, Rfl: 11 .  terazosin (HYTRIN) 5 MG capsule, Take 1 capsule (5 mg total) by mouth at bedtime., Disp: 90 capsule, Rfl: 3 .  torsemide (DEMADEX) 20 MG tablet, Take 1 tablet (20 mg total) by mouth daily., Disp: 90  tablet, Rfl: 3     [name], DO [facility] 03/15/2020 11:37 AM

## 2020-03-24 ENCOUNTER — Other Ambulatory Visit: Payer: Self-pay | Admitting: Student in an Organized Health Care Education/Training Program

## 2020-04-06 ENCOUNTER — Other Ambulatory Visit: Payer: Self-pay | Admitting: Student in an Organized Health Care Education/Training Program

## 2020-04-06 DIAGNOSIS — I10 Essential (primary) hypertension: Secondary | ICD-10-CM

## 2020-04-06 DIAGNOSIS — I25119 Atherosclerotic heart disease of native coronary artery with unspecified angina pectoris: Secondary | ICD-10-CM

## 2020-04-29 ENCOUNTER — Encounter: Payer: Self-pay | Admitting: Pulmonary Disease

## 2020-04-29 ENCOUNTER — Other Ambulatory Visit: Payer: Self-pay

## 2020-04-29 ENCOUNTER — Ambulatory Visit (INDEPENDENT_AMBULATORY_CARE_PROVIDER_SITE_OTHER): Payer: Medicare Other | Admitting: Pulmonary Disease

## 2020-04-29 VITALS — BP 120/64 | HR 60 | Temp 98.5°F | Ht 72.0 in | Wt 312.8 lb

## 2020-04-29 DIAGNOSIS — G4733 Obstructive sleep apnea (adult) (pediatric): Secondary | ICD-10-CM | POA: Diagnosis not present

## 2020-04-29 NOTE — Progress Notes (Signed)
Juan Calderon    [id number]    May 26, 1956  Primary Care Physician:Vincent, Mallie Mussel, MD  Referring Physician: Axel Filler, MD [address] [place],  [place] [address]  Chief complaint:   History of snoring, past history of obstructive sleep apnea  HPI:  Was diagnosed with sleep apnea a few years back, was not able to tolerate CPAP Use CPAP for about 2 months Was having difficulty keeping the mask on  He has dryness of his mouth in the mornings, no night sweats, no headache, memory is fair Usually goes to bed about 11:00 About 1-2 awakenings Final wake up time about 613 Previous sleep study was in 2014  Has a history of hypertension, diastolic heart failure, A. fib, obstructive lung disease  No family history of obstructive sleep apnea  Outpatient Encounter Medications as of 04/29/2020  Medication Sig  . albuterol (VENTOLIN HFA) 108 (90 Base) MCG/ACT inhaler Inhale 1-2 puffs into the lungs every 6 (six) hours as needed for shortness of breath.  . allopurinol (ZYLOPRIM) 300 MG tablet TAKE 1 TABLET (300 MG TOTAL) BY MOUTH DAILY. PLEASE NOTE THE CHANGE IN THE NUMBER OF TABLETS TO TAKE DAILY.  [name] atorvastatin (LIPITOR) 20 MG tablet Take 1 tablet (20 mg total) by mouth daily.  . Blood Glucose Monitoring Suppl (ONETOUCH VERIO) w/Device KIT 1 each by Does not apply route daily.  [name] ELIQUIS 5 MG TABS tablet TAKE 1 TABLET BY MOUTH TWICE A DAY (Patient taking differently: Take 5 mg by mouth 2 (two) times daily. )  . flecainide (TAMBOCOR) 100 MG tablet Take 1 tablet (100 mg total) by mouth every 12 (twelve) hours.  [name] glucose blood (ONETOUCH VERIO) test strip Use as instructed  . latanoprost (XALATAN) 0.005 % ophthalmic solution Place 1 drop into both eyes at bedtime.  . magnesium oxide (MAG-OX) 400 MG tablet Take 400 mg by mouth daily.   . metFORMIN (GLUCOPHAGE) 1000 MG tablet Take 1 tablet (1,000 mg total) by mouth 2 (two) times daily with a meal.  .  metoprolol tartrate (LOPRESSOR) 25 MG tablet TAKE 1 TABLET BY MOUTH TWICE A DAY  . nitroGLYCERIN (NITROSTAT) 0.4 MG SL tablet Place 1 tablet (0.4 mg total) under the tongue every 5 (five) minutes as needed for chest pain.  [name] Olopatadine HCl 0.2 % SOLN Place 1 drop into both eyes daily.  Glory Rosebush DELICA LANCETS 65B MISC Use 1 strip daily  . oxyCODONE-acetaminophen (PERCOCET) 10-325 MG tablet Take 1 tablet by mouth every 6 (six) hours as needed for pain.  . pantoprazole (PROTONIX) 40 MG tablet Take 1 tablet (40 mg total) by mouth 2 (two) times daily.  [name] Propylene Glycol (SYSTANE BALANCE OP) Place 1 drop into both eyes daily.  . Sorbitol 70 % SOLN Take 15-60 mLs by mouth daily as needed. (Patient taking differently: Take 15-60 mLs by mouth daily as needed (for constipation). )  . terazosin (HYTRIN) 5 MG capsule TAKE 1 CAPSULE (5 MG TOTAL) BY MOUTH AT BEDTIME.  [name] torsemide (DEMADEX) 20 MG tablet Take 1 tablet (20 mg total) by mouth daily.   No facility-administered encounter medications on file as of 04/29/2020.    Allergies as of 04/29/2020 - Review Complete 04/29/2020  Allergen Reaction Noted  . Ramipril Swelling 09/18/2011  . Tape Other (See Comments) 12/03/2018  . Testosterone Rash 12/04/2011    Past Medical History:  Diagnosis Date  . Alcohol abuse    . Bradycardia   .  C6 radiculopathy 01/24/2016   Right upper extremity, mild to moderate electrically by EMG on 01/24/2016  . Cataract    Left eye  . Chronic diastolic heart failure ([place])     with mild left ventricular hypertrophy on Echo 02/2010  . Chronic obstructive pulmonary disease ([place])    . Chronic osteomyelitis of femur ([place]) 04/06/2016  . Chronic osteomyelitis of left femur ([place]) 11/22/2017   Left femur s/p prior trauma  . Chronic osteomyelitis of left femur ([place]) 11/22/2017   Brodie's abscess: left femur s/p prior trauma.  Underwent partial excision and curettage of left femoral osteomyelitis at [facility] 12/30/2017 with grossly  purulent material encountered within the medullary canal of the left distal femur.  Cultures grew MSSA.  Post-operatively received 6 weeks of IV antibiotics through 02/10/2018.  CRP elevated at 60.3 at end of IV antibiotic course so continued on Keflex  . Chronic pain syndrome     Left arm and leg s/p traumatic injury   . Chronic renal insufficiency    . Coronary artery disease     25% LAD stenosis on cath 2007.  Stable angina.  . Diverticulosis    . Diverticulosis 11/12/2013  . Essential hypertension    . Frequent PVCs   . Gastroesophageal reflux disease    . Gout    . Hyperlipidemia LDL goal < 100    . Internal hemorrhoids without complication 19/62/2297  . Long-term current use of opiate analgesic 09/07/2016  . Mild carpal tunnel syndrome of right wrist 01/24/2016   Mild degree electrically per EMG 01/24/2016   . Mild carpal tunnel syndrome of right wrist 01/24/2016   Mild degree electrically per EMG 01/24/2016   . Morbid obesity with BMI of 40.0-44.9, adult ([place])    . Normocytic anemia    . NSVT (nonsustained ventricular tachycardia) ([place])   . Obstructive sleep apnea     Moderate, AHI 29.8 per hour with moderately loud snoring and oxygen desaturation to a nadir of 79%. CPAP titration resulted in a prescription for 17 CWP.    [name] Open-angle glaucoma    . Osteoarthritis cervical spine    . Osteoarthritis of left knee 06/19/2013   Tricompartmental disease.  Treated with double hinged upright knee brace, steroid/xylocaine knee injections, and NSAIDs   . Osteoporosis 05/14/2017   s/p fracture of the right humerus from a fall at ground hight  . Persistent atrial fibrillation ([place])   . Right rotator cuff tear     Large full-thickness tear of the supraspinatus with mild retraction but no atrophy   . Right rotator cuff tear 04/25/2013   Large full-thickness tear of the supraspinatus with mild retraction but no atrophy    . Secondary male hypogonadism 02/07/2017   Likely secondary to chronic opioid  use  . Secondary male hypogonadism 02/07/2017   Likely secondary to chronic opioid use  . Subclinical hypothyroidism    . Tubular adenoma of colon 11/22/2017   Specifics unknown.  Repeat colonoscopy 08/12/2018 with six 3-6 mm tubular adenomas removed endoscopically.  . 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 ([place])    . Vasomotor rhinitis 04/25/2013    Past Surgical History:  Procedure Laterality Date  . A-FLUTTER ABLATION N/A 03/24/2019   Procedure: A-FLUTTER ABLATION;  Surgeon: [name], MD;  Location: [place] [facility] [facility];  Service: Cardiovascular;  Laterality: N/A;  . CARDIOVERSION N/A 12/30/2014   Procedure: CARDIOVERSION;  Surgeon: [name], MD;  Location: [place] [facility];  Service: Cardiovascular;  Laterality:  N/A;  . FRACTURE SURGERY Left 1980's   Elbow  . Left arm surgery    . Left leg surgery    . SHOULDER SURGERY     Right    Family History  Problem Relation Age of Onset  . Heart failure Mother   . Asthma Mother   . Alzheimer's disease Father   . Hypertension Sister   . Hypertension Sister   . Hypertension Sister   . Cancer Brother        unknown  . Early death Brother        Manufacturing systems engineer  . Cancer Brother        prostate  . Prostate cancer Brother   . Heart attack Neg Hx   . Stroke Neg Hx   . Colon cancer Neg Hx   . Esophageal cancer Neg Hx   . Pancreatic cancer Neg Hx   . Stomach cancer Neg Hx   . Liver disease Neg Hx     Social History   Socioeconomic History  . Marital status: Widowed    Spouse name: Not on file  . Number of children: Not on file  . Years of education: Not on file  . Highest education level: Not on file  Occupational History  . Occupation: Disabled  Tobacco Use  . Smoking status: Never Smoker  . Smokeless tobacco: Never Used  Vaping Use  . Vaping Use: Never used  Substance and Sexual Activity  . Alcohol use: Yes    Alcohol/week: 14.0 standard drinks    Types: 14 Cans of beer per week    . Drug use: No  . Sexual activity: Not Currently  Other Topics Concern  . Not on file  Social History Narrative   Current Social History 07/01/2019        Patient lives with friends in a home which is 1 story. There are 4 steps with handrails up to the entrance the patient uses.       Patient's method of transportation is personal car.      The highest level of education was high school diploma.      The patient currently disabled.      Identified important Relationships are "My son and my sister"       Pets : None       Interests / Fun: "Watch TV"       Current Stressors: "Not being able to get around like I used to."       Religious / Personal Beliefs: Baptist       L. [place], BSN, RN-BC       Social Determinants of Health   Financial Resource Strain:   . Difficulty of Paying Living Expenses: Not on file  Food Insecurity:   . Worried About Charity fundraiser in the Last Year: Not on file  . Ran Out of Food in the Last Year: Not on file  Transportation Needs:   . Lack of Transportation (Medical): Not on file  . Lack of Transportation (Non-Medical): Not on file  Physical Activity:   . Days of Exercise per Week: Not on file  . Minutes of Exercise per Session: Not on file  Stress:   . Feeling of Stress : Not on file  Social Connections:   . Frequency of Communication with Friends and Family: Not on file  . Frequency of Social Gatherings with Friends and Family: Not on file  . Attends Religious Services: Not on file  .  Active Member of Clubs or Organizations: Not on file  . Attends Archivist Meetings: Not on file  . Marital Status: Not on file  Intimate Partner Violence:   . Fear of Current or Ex-Partner: Not on file  . Emotionally Abused: Not on file  . Physically Abused: Not on file  . Sexually Abused: Not on file    Review of Systems  Constitutional: Positive for fatigue.  Respiratory: Positive for apnea and shortness of breath.    Psychiatric/Behavioral: Positive for sleep disturbance.    Vitals:   04/29/20 1357  BP: 120/64  Pulse: 60  Temp: 98.5 F (36.9 C)  SpO2: 96%     Physical Exam Constitutional:      Appearance: He is obese.  HENT:     Mouth/Throat:     Mouth: Mucous membranes are moist.     Comments: Mallampati 4, crowded oropharynx Eyes:     General:        Right eye: No discharge.        Left eye: No discharge.  Cardiovascular:     Rate and Rhythm: Normal rate and regular rhythm.     Heart sounds: No murmur heard.  No friction rub.  Pulmonary:     Effort: Pulmonary effort is normal. No respiratory distress.     Breath sounds: Normal breath sounds. No stridor. No wheezing or rhonchi.  Musculoskeletal:     Cervical back: No rigidity or tenderness.  Neurological:     General: No focal deficit present.     Mental Status: He is alert.  Psychiatric:        Mood and Affect: Mood normal.    Results of the Epworth flowsheet 04/29/2020  Sitting and reading 2  Watching TV 2  Sitting, inactive in a public place (e.g. a theatre or a meeting) 0  As a passenger in a car for an hour without a break 0  Lying down to rest in the afternoon when circumstances permit 0  Sitting and talking to someone 0  Sitting quietly after a lunch without alcohol 0  In a car, while stopped for a few minutes in traffic 0  Total score 4    Data Reviewed: Previous sleep study reviewed showing that he was titrated to a CPAP of 16  Assessment:  Obstructive lung disease  High probability of significant obstructive sleep apnea -Past history of obstructive sleep apnea -Did not tolerate CPAP in the past  Pathophysiology of sleep disordered breathing discussed with the patient Treatment options for sleep disordered breathing discussed with the patient  Has significant underlying lung disease and heart disease   Plan/Recommendations: Risk of not treating sleep disordered breathing discussed with the  patient  We will schedule him for home sleep study  He will consider using CPAP  Weight loss encouraged   Sherrilyn Rist MD [facility] 04/29/2020, 2:18 PM  CC: Axel Filler,*

## 2020-04-29 NOTE — Progress Notes (Signed)
Home

## 2020-04-29 NOTE — Patient Instructions (Signed)
History of obstructive sleep apnea  We will schedule you for a home sleep study Treatment options as we discussed  Update you with results as soon as reviewed  Continue weight loss efforts  I will see you back in the office in about 3 months

## 2020-05-03 ENCOUNTER — Other Ambulatory Visit: Payer: Self-pay | Admitting: Student in an Organized Health Care Education/Training Program

## 2020-05-03 DIAGNOSIS — Z79891 Long term (current) use of opiate analgesic: Secondary | ICD-10-CM

## 2020-05-03 DIAGNOSIS — M170 Bilateral primary osteoarthritis of knee: Secondary | ICD-10-CM | POA: Diagnosis not present

## 2020-05-03 MED ORDER — OXYCODONE-ACETAMINOPHEN 10-325 MG PO TABS: 1.0000 | ORAL_TABLET | Freq: Four times a day (QID) | ORAL | 0 refills | Status: DC | PRN

## 2020-05-05 ENCOUNTER — Other Ambulatory Visit: Payer: Self-pay | Admitting: Student in an Organized Health Care Education/Training Program

## 2020-05-05 DIAGNOSIS — I25119 Atherosclerotic heart disease of native coronary artery with unspecified angina pectoris: Secondary | ICD-10-CM

## 2020-05-06 ENCOUNTER — Telehealth: Payer: Self-pay | Admitting: *Deleted

## 2020-05-06 NOTE — Telephone Encounter (Signed)
Pt called /stated he had called on the 8/31 for Oxycodone refill and ran out on 9/1. Informed pt it was refilled on 8/31. Stated he did not received a call from the pharmacy. I called [facility] - stated it was filled [place] waiting for pt to pick up. Called pt back - informed of the above. Stated he's currently out of town ( brother's death). Informed next time to call the pharmacy if they do not call him. Stated they have always called/sent a text him of any refills. Stated he will have to wait until he returns home.

## 2020-06-02 ENCOUNTER — Other Ambulatory Visit: Payer: Self-pay | Admitting: Student in an Organized Health Care Education/Training Program

## 2020-06-02 DIAGNOSIS — Z79891 Long term (current) use of opiate analgesic: Secondary | ICD-10-CM

## 2020-06-02 MED ORDER — OXYCODONE-ACETAMINOPHEN 10-325 MG PO TABS: 1.0000 | ORAL_TABLET | Freq: Four times a day (QID) | ORAL | 0 refills | Status: DC | PRN

## 2020-06-02 NOTE — Telephone Encounter (Signed)
Last office visit: 03/14/20 Last UDS: 12/07/19 Last written 05/02/20 Next Appt: With Internal Medicine Damita Dunnings Lorenda Ishihara, MD) 06/06/2020 at 9:45 AM

## 2020-06-03 DIAGNOSIS — M1732 Unilateral post-traumatic osteoarthritis, left knee: Secondary | ICD-10-CM | POA: Diagnosis not present

## 2020-06-03 DIAGNOSIS — E119 Type 2 diabetes mellitus without complications: Secondary | ICD-10-CM | POA: Diagnosis not present

## 2020-06-03 DIAGNOSIS — M1711 Unilateral primary osteoarthritis, right knee: Secondary | ICD-10-CM | POA: Diagnosis not present

## 2020-06-03 DIAGNOSIS — M1712 Unilateral primary osteoarthritis, left knee: Secondary | ICD-10-CM | POA: Diagnosis not present

## 2020-06-03 DIAGNOSIS — M1611 Unilateral primary osteoarthritis, right hip: Secondary | ICD-10-CM | POA: Diagnosis not present

## 2020-06-03 DIAGNOSIS — I10 Essential (primary) hypertension: Secondary | ICD-10-CM | POA: Diagnosis not present

## 2020-06-03 DIAGNOSIS — J449 Chronic obstructive pulmonary disease, unspecified: Secondary | ICD-10-CM | POA: Diagnosis not present

## 2020-06-06 ENCOUNTER — Other Ambulatory Visit: Payer: Self-pay

## 2020-06-06 ENCOUNTER — Ambulatory Visit (INDEPENDENT_AMBULATORY_CARE_PROVIDER_SITE_OTHER): Payer: Medicare Other | Admitting: Student in an Organized Health Care Education/Training Program

## 2020-06-06 ENCOUNTER — Encounter: Payer: Self-pay | Admitting: Student in an Organized Health Care Education/Training Program

## 2020-06-06 VITALS — BP 164/84 | HR 55 | Temp 98.3°F | Ht 72.0 in | Wt 309.5 lb

## 2020-06-06 DIAGNOSIS — N521 Erectile dysfunction due to diseases classified elsewhere: Secondary | ICD-10-CM

## 2020-06-06 DIAGNOSIS — Z79891 Long term (current) use of opiate analgesic: Secondary | ICD-10-CM

## 2020-06-06 DIAGNOSIS — E1140 Type 2 diabetes mellitus with diabetic neuropathy, unspecified: Secondary | ICD-10-CM | POA: Diagnosis not present

## 2020-06-06 DIAGNOSIS — Z23 Encounter for immunization: Secondary | ICD-10-CM

## 2020-06-06 DIAGNOSIS — I10 Essential (primary) hypertension: Secondary | ICD-10-CM

## 2020-06-06 LAB — POCT GLYCOSYLATED HEMOGLOBIN (HGB A1C): Hemoglobin A1C: 7.2 % — AB (ref 4.0–5.6)

## 2020-06-06 LAB — GLUCOSE, CAPILLARY: Glucose-Capillary: 224 mg/dL — ABNORMAL HIGH (ref 70–99)

## 2020-06-06 MED ORDER — LOSARTAN POTASSIUM 50 MG PO TABS: 50.0000 mg | ORAL_TABLET | Freq: Every day | ORAL | 3 refills | Status: DC

## 2020-06-06 MED ORDER — OXYCODONE-ACETAMINOPHEN 10-325 MG PO TABS: 1.0000 | ORAL_TABLET | Freq: Four times a day (QID) | ORAL | 0 refills | Status: DC | PRN

## 2020-06-06 NOTE — Progress Notes (Signed)
   Assessment and Plan:  See Encounters tab for problem-based medical decision making.   __________________________________________________________  HPI:   64 year old person here for follow-up of diabetes and hypertension.  He is living with severe obesity and posttraumatic osteoarthritis of both of his knees and right hip, being managed with serial steroid injections through orthopedics.  He also has heart failure with preserved ejection fraction complicated by paroxysmal atrial fibrillation, one ED visit with a heart failure at home admission last summer.  Doing pretty well last few months at home.  Functionally limited by his chronic pain from the osteoarthritis, in addition to shortness of breath from the heart failure.  Reports good adherence with his medications, denies any adverse side effects.  Independent in his activities of daily living.  Lives by himself.  No recent illnesses, no fevers or chills, no chest pain, dyspnea on exertion is stable.  No orthopnea, no new lower extremity edema.  __________________________________________________________  Problem List: Patient Active Problem List   Diagnosis Date Noted  . Chronic use of opiate drug for therapeutic purpose 08/23/2017    Priority: High  . Paroxysmal atrial fibrillation ([facility]) 10/25/2015    Priority: High  . 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 ([place]) 04/25/2013    Priority: High  . Severe obesity with body mass index (BMI) of 35.0 to 39.9 with comorbidity ([place]) 04/25/2013    Priority: High  . Alcohol use disorder 02/28/2015    Priority: Medium  . Obstructive sleep apnea 06/01/2013    Priority: Medium  . Osteoarthritis cervical spine 04/25/2013    Priority: Medium  . Hyperlipidemia 04/25/2013    Priority: Medium  . Coronary artery disease involving native coronary artery with angina pectoris ([place]) 04/25/2013    Priority: Medium  . COPD (chronic obstructive pulmonary disease) ([place])  04/25/2013    Priority: Medium  . Chronic diastolic heart failure ([place]) 02/04/2012    Priority: Medium  . Essential hypertension 09/20/2011    Priority: Medium  . Tubular adenoma of colon 11/22/2017    Priority: Low  . C6 radiculopathy 01/24/2016    Priority: Low  . Constipation due to opioid therapy 12/24/2014    Priority: Low  . Post-traumatic osteoarthritis of left knee 06/19/2013    Priority: Low  . Gastroesophageal reflux disease without esophagitis 04/25/2013    Priority: Low  . Open-angle glaucoma 04/25/2013    Priority: Low  . Idiopathic chronic gout without tophus 04/25/2013    Priority: Low  . Healthcare maintenance 01/15/2013    Priority: Low    Medications: Reconciled today in Epic __________________________________________________________  Physical Exam:  Vital Signs: Vitals:   06/06/20 0930  BP: (!) 164/84  Pulse: (!) 55  Temp: 98.3 F (36.8 C)  TempSrc: Oral  SpO2: 96%  Weight: (!) 309 lb 8 oz (140.4 kg)  Height: 6' (1.829 m)    Gen: Well appearing, NAD CV: RRR, no murmurs, distant heart sounds Pulm: Normal effort, CTA throughout, no wheezing Ext: Warm, chronic nonpitting edema of the left lower leg, no edema on the right

## 2020-06-06 NOTE — Patient Instructions (Signed)
Is great seeing you today in clinic.  I am adding 1 blood pressure medication, it is in the same family of medicines as one that you were taking up until 2018.  Please follow-up with me in 6 weeks so that we can recheck your blood pressure and your blood work.  Please measure your own blood pressure at home using your wrist cuff, check it a couple times a week and write it down and please bring that into your next visit.

## 2020-06-06 NOTE — Assessment & Plan Note (Signed)
Stable, has chronic pain generators with bilateral knee osteoarthritis and right hip osteoarthritis.  Doing well on oxycodone 3-4 tablets/day.  This is helping with his functional capacity.  No adverse side effects.  No at risk features.  Plan to continue with these chronic opioids indefinitely as there will be no easy solution to his osteoarthritis.

## 2020-06-06 NOTE — Assessment & Plan Note (Signed)
Blood pressure elevated on this visit and the last visit.  Historically was well controlled with valsartan 160 mg daily, this was discontinued in 2018 as he had a period of much better blood pressure control.  I am going to start losartan 50 mg daily.  This will be important given his diabetes as well.  Kidney function was normal 3 months ago.  Plan to follow-up in 6 weeks for repeat blood pressure check and labs.  He has a blood pressure cuff at home and I asked him to keep record of a few blood pressure readings per week.

## 2020-06-06 NOTE — Assessment & Plan Note (Signed)
Good control with A1c of 7.2%.  Plan to continue with Metformin 1000 mg twice daily, adding losartan today for BP managements and will be renal protective.

## 2020-06-13 ENCOUNTER — Ambulatory Visit: Payer: Medicare Other

## 2020-06-13 ENCOUNTER — Other Ambulatory Visit: Payer: Self-pay

## 2020-06-13 DIAGNOSIS — G4733 Obstructive sleep apnea (adult) (pediatric): Secondary | ICD-10-CM

## 2020-06-16 ENCOUNTER — Telehealth: Payer: Self-pay | Admitting: Pulmonary Disease

## 2020-06-16 DIAGNOSIS — G4733 Obstructive sleep apnea (adult) (pediatric): Secondary | ICD-10-CM | POA: Diagnosis not present

## 2020-06-16 NOTE — Telephone Encounter (Signed)
Call patient  Sleep study result  Date of study: 06/13/2020  Impression: Severe obstructive sleep apnea Moderately severe oxygen desaturations  Recommendation: DME referral  Recommend CPAP therapy for severe obstructive sleep apnea  Auto titrating CPAP with pressure settings of 10-20 will be appropriate  Encourage weight loss measures  Follow-up in the office 4 to 6 weeks following initiation of treatment

## 2020-06-16 NOTE — Telephone Encounter (Signed)
Dr. Ander Slade has reviewed the home sleep test this showed severe.   Recommendations   Treatment options are CPAP with the settings auto 10 to 20.    Weight loss measures .   Advise against driving while sleepy & against medication with sedative side effects.    Make appointment for 3 months for compliance with download with Dr. Ander Slade.   Pt was called given sleep study results and recommendations but states cpap comes off face at night can't sleep with cpap without mask coming off face states will need a better plan. Because cpap will not work for him

## 2020-06-21 ENCOUNTER — Telehealth: Payer: Self-pay | Admitting: Pulmonary Disease

## 2020-06-21 DIAGNOSIS — G4733 Obstructive sleep apnea (adult) (pediatric): Secondary | ICD-10-CM

## 2020-06-21 NOTE — Telephone Encounter (Signed)
DME will usually work with patient to find the most suitable

## 2020-06-21 NOTE — Telephone Encounter (Signed)
Spoke with pt. He is aware of Dr. Judson Roch response. Order has been placed for CPAP. Nothing further was needed.

## 2020-06-21 NOTE — Telephone Encounter (Signed)
Called and spoke with patient who states that he would like to proceed with CPAP. His only issue is that he sleeps on his stomach and states that mask always comes off and would need something that works for that.   Dr. Ander Slade is there a mask preference that you suggest for this patient and we can put in order for CPAP

## 2020-07-02 ENCOUNTER — Other Ambulatory Visit: Payer: Self-pay | Admitting: Student in an Organized Health Care Education/Training Program

## 2020-07-04 ENCOUNTER — Other Ambulatory Visit: Payer: Self-pay

## 2020-07-04 ENCOUNTER — Encounter: Payer: Self-pay | Admitting: Cardiovascular Disease

## 2020-07-04 ENCOUNTER — Ambulatory Visit (INDEPENDENT_AMBULATORY_CARE_PROVIDER_SITE_OTHER): Payer: Medicare Other | Admitting: Cardiovascular Disease

## 2020-07-04 VITALS — BP 114/66 | HR 63 | Ht 72.0 in | Wt 304.0 lb

## 2020-07-04 DIAGNOSIS — I480 Paroxysmal atrial fibrillation: Secondary | ICD-10-CM | POA: Diagnosis not present

## 2020-07-04 DIAGNOSIS — I10 Essential (primary) hypertension: Secondary | ICD-10-CM

## 2020-07-04 DIAGNOSIS — I5032 Chronic diastolic (congestive) heart failure: Secondary | ICD-10-CM

## 2020-07-04 DIAGNOSIS — I2510 Atherosclerotic heart disease of native coronary artery without angina pectoris: Secondary | ICD-10-CM

## 2020-07-04 NOTE — Patient Instructions (Signed)
Medication Instructions:  Your provider recommends that you continue on your current medications as directed. Please refer to the Current Medication list given to you today.   *If you need a refill on your cardiac medications before your next appointment, please call your pharmacy*   Follow-Up: At [facility], you and your health needs are our priority.  As part of our continuing mission to provide you with exceptional heart care, we have created designated Provider Care Teams.  These Care Teams include your primary Cardiologist (physician) and Advanced Practice Providers (APPs -  Physician Assistants and Nurse Practitioners) who all work together to provide you with the care you need, when you need it.  Your next appointment:   12 month(s) The format for your next appointment:   In Person Provider:   You may see Christopher McAlhany, MD or one of the following Advanced Practice Providers on your designated Care Team:    Dayna Dunn, PA-C  Michele Lenze, PA-C  

## 2020-07-04 NOTE — Progress Notes (Signed)
Date:  07/04/2020   ID:  STEDMAN SUMMERVILLE, DOB 1955/12/22, MRN [id number]  PCP:  Axel Filler, MD  Cardiologist:  Lauree Chandler, MD [facility] Electrophysiologist:  [name], MD   Chief Complaint:   No chief complaint on file.   History of Present Illness:    64 yo male with history of chronic diastolic CHF, CAD, persistent atrial fibrillation, bradycardia, frequent PVCs, NSVT, COPD, HTN, HLD, gout, GERD, DM, chronic venous stasis, morbid obesity and chronic dyspnea who is here today for cardiac follow up. He had been followed by Dr. [name] [facility] in [place], [place]. Echo June 2011 in [place], [place] with mild LVH, EF of 55%. mild LAE. Monitor April 2012 in [place], [place] showed NSR with episodes of sinus brady, rare PVCs, occasional PACs and brief episodes of atrial tachycardia. Per records he has been on Multaq in the past but did not like this medication so it was stopped. He had a stress myoview 2007 that showed reversible ischemia in the inferior wall. This led to a cardiac cath on 10/01/05 which showed 25% mid LAD stenosis per report but no other evidence of CAD.  Admitted October 2015 with volume overload and echo showed normal systolic function EF 09%. He diuresed down to his dry weight of 304 pounds. Metoprolol was decreased to 50 mg twice a day because of nocturnal bradycardia with rates in the 30s. Digoxin was discontinued. He was seen in March 2016 and had c/o dyspnea on exertion. Stress myoview 11/25/14 with no ischemia. 24 hour monitor showed atrial fib with bradycardia (rates as low as 40 bpm), frequent PVCs (9000 in 24 hours) and non-sustained VT. He was seen by Dr. [name] 12/23/14 and was started on Flecainide. He underwent DCCV on 12/30/14. Echo May 2016 with LVEF=55-60%. He tried CPAP but did not tolerate. He has been followed in our EP clinic by Dr. [name] over the past few years and has remained on Flecainide and Eliquis. Echo July 2020  with LVEF=55-60%. No valve disease.    He is here today for follow up. The patient denies any chest pain, dyspnea, palpitations, lower extremity edema, orthopnea, PND, dizziness, near syncope or syncope.     Past Medical History:  Diagnosis Date  . Alcohol abuse    . Bradycardia   . C6 radiculopathy 01/24/2016   Right upper extremity, mild to moderate electrically by EMG on 01/24/2016  . Cataract    Left eye  . Chronic diastolic heart failure ([place])     with mild left ventricular hypertrophy on Echo 02/2010  . Chronic obstructive pulmonary disease ([place])    . Chronic osteomyelitis of femur ([place]) 04/06/2016  . Chronic osteomyelitis of left femur ([place]) 11/22/2017   Left femur s/p prior trauma  . Chronic osteomyelitis of left femur ([place]) 11/22/2017   Brodie's abscess: left femur s/p prior trauma.  Underwent partial excision and curettage of left femoral osteomyelitis at [facility] 12/30/2017 with grossly purulent material encountered within the medullary canal of the left distal femur.  Cultures grew MSSA.  Post-operatively received 6 weeks of IV antibiotics through 02/10/2018.  CRP elevated at 60.3 at end of IV antibiotic course so continued on Keflex  . Chronic pain syndrome     Left arm and leg s/p traumatic injury   . Chronic renal insufficiency    . Coronary artery disease     25% LAD stenosis on cath 2007.  Stable angina.  . Diverticulosis    .  Diverticulosis 11/12/2013  . Essential hypertension    . Frequent PVCs   . Gastroesophageal reflux disease    . Gout    . Hyperlipidemia LDL goal < 100    . Internal hemorrhoids without complication 99/24/2683  . Long-term current use of opiate analgesic 09/07/2016  . Mild carpal tunnel syndrome of right wrist 01/24/2016   Mild degree electrically per EMG 01/24/2016   . Mild carpal tunnel syndrome of right wrist 01/24/2016   Mild degree electrically per EMG 01/24/2016   . Morbid obesity with BMI of 40.0-44.9, adult ([place])    . Normocytic anemia    . NSVT  (nonsustained ventricular tachycardia) ([place])   . Obstructive sleep apnea     Moderate, AHI 29.8 per hour with moderately loud snoring and oxygen desaturation to a nadir of 79%. CPAP titration resulted in a prescription for 17 CWP.    [name] Open-angle glaucoma    . Osteoarthritis cervical spine    . Osteoarthritis of left knee 06/19/2013   Tricompartmental disease.  Treated with double hinged upright knee brace, steroid/xylocaine knee injections, and NSAIDs   . Osteoporosis 05/14/2017   s/p fracture of the right humerus from a fall at ground hight  . Persistent atrial fibrillation ([place])   . Right rotator cuff tear     Large full-thickness tear of the supraspinatus with mild retraction but no atrophy   . Right rotator cuff tear 04/25/2013   Large full-thickness tear of the supraspinatus with mild retraction but no atrophy    . Secondary male hypogonadism 02/07/2017   Likely secondary to chronic opioid use  . Secondary male hypogonadism 02/07/2017   Likely secondary to chronic opioid use  . Subclinical hypothyroidism    . Tubular adenoma of colon 11/22/2017   Specifics unknown.  Repeat colonoscopy 08/12/2018 with six 3-6 mm tubular adenomas removed endoscopically.  . 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 ([place])    . Vasomotor rhinitis 04/25/2013   Past Surgical History:  Procedure Laterality Date  . A-FLUTTER ABLATION N/A 03/24/2019   Procedure: A-FLUTTER ABLATION;  Surgeon: [name], MD;  Location: [facility];  Service: Cardiovascular;  Laterality: N/A;  . CARDIOVERSION N/A 12/30/2014   Procedure: CARDIOVERSION;  Surgeon: [name], MD;  Location: [facility] ENDOSCOPY;  Service: Cardiovascular;  Laterality: N/A;  . FRACTURE SURGERY Left 1980's   Elbow  . Left arm surgery    . Left leg surgery    . SHOULDER SURGERY     Right     Current Meds  Medication Sig  . albuterol (VENTOLIN HFA) 108 (90 Base) MCG/ACT inhaler Inhale 1-2 puffs into the lungs  every 6 (six) hours as needed for shortness of breath.  . allopurinol (ZYLOPRIM) 300 MG tablet TAKE 1 TABLET (300 MG TOTAL) BY MOUTH DAILY. PLEASE NOTE THE CHANGE IN THE NUMBER OF TABLETS TO TAKE DAILY.  [name] atorvastatin (LIPITOR) 20 MG tablet TAKE 1 TABLET BY MOUTH EVERY DAY  . Blood Glucose Monitoring Suppl (ONETOUCH VERIO) w/Device KIT 1 each by Does not apply route daily.  [name] ELIQUIS 5 MG TABS tablet TAKE 1 TABLET BY MOUTH TWICE A DAY (Patient taking differently: Take 5 mg by mouth 2 (two) times daily. )  . flecainide (TAMBOCOR) 100 MG tablet Take 1 tablet (100 mg total) by mouth every 12 (twelve) hours.  [name] glucose blood (ONETOUCH VERIO) test strip Use as instructed  . latanoprost (XALATAN) 0.005 % ophthalmic solution Place 1 drop into both eyes  at bedtime.  [name] losartan (COZAAR) 50 MG tablet Take 1 tablet (50 mg total) by mouth daily.  . magnesium oxide (MAG-OX) 400 MG tablet Take 400 mg by mouth daily.   . metFORMIN (GLUCOPHAGE) 1000 MG tablet Take 1 tablet (1,000 mg total) by mouth 2 (two) times daily with a meal.  . metoprolol tartrate (LOPRESSOR) 25 MG tablet TAKE 1 TABLET BY MOUTH TWICE A DAY  . nitroGLYCERIN (NITROSTAT) 0.4 MG SL tablet Place 1 tablet (0.4 mg total) under the tongue every 5 (five) minutes as needed for chest pain.  [name] Olopatadine HCl 0.2 % SOLN Place 1 drop into both eyes daily.  Glory Rosebush DELICA LANCETS 23F MISC Use 1 strip daily  . oxyCODONE-acetaminophen (PERCOCET) 10-325 MG tablet Take 1 tablet by mouth every 6 (six) hours as needed for pain.  . pantoprazole (PROTONIX) 40 MG tablet Take 1 tablet (40 mg total) by mouth 2 (two) times daily.  [name] Propylene Glycol (SYSTANE BALANCE OP) Place 1 drop into both eyes daily.  . Sorbitol 70 % SOLN Take 15-60 mLs by mouth daily as needed. (Patient taking differently: Take 15-60 mLs by mouth daily as needed (for constipation). )  . terazosin (HYTRIN) 5 MG capsule TAKE 1 CAPSULE (5 MG TOTAL) BY MOUTH AT BEDTIME.  [name] torsemide (DEMADEX)  20 MG tablet Take 1 tablet (20 mg total) by mouth daily.     Allergies:   Ramipril, Tape, and Testosterone   Social History   Tobacco Use  . Smoking status: Never Smoker  . Smokeless tobacco: Never Used  Vaping Use  . Vaping Use: Never used  Substance Use Topics  . Alcohol use: Yes    Alcohol/week: 14.0 standard drinks    Types: 14 Cans of beer per week  . Drug use: No     Family Hx: The patient's family history includes Alzheimer's disease in his father; Asthma in his mother; Cancer in his brother and brother; Early death in his brother; Heart failure in his mother; Hypertension in his sister, sister, and sister; Prostate cancer in his brother. There is no history of Heart attack, Stroke, Colon cancer, Esophageal cancer, Pancreatic cancer, Stomach cancer, or Liver disease.  ROS:   Please see the history of present illness.    All other systems reviewed and are negative.   Labs/Other Tests and Data Reviewed:    Recent Labs: 02/23/2020: ALT 22; B Natriuretic Peptide 143.2; BUN 11; Creatinine, Ser 1.23; Hemoglobin 12.7; Platelets 207; Potassium 3.5; Sodium 141   Recent Lipid Panel Lab Results  Component Value Date/Time   CHOL 95 03/23/2019 10:30 AM   TRIG 119 03/23/2019 10:30 AM   HDL 48 03/23/2019 10:30 AM   CHOLHDL 2.0 03/23/2019 10:30 AM   LDLCALC 23 03/23/2019 10:30 AM    Wt Readings from Last 3 Encounters:  07/04/20 (!) 304 lb (137.9 kg)  06/06/20 (!) 309 lb 8 oz (140.4 kg)  04/29/20 (!) 312 lb 12.8 oz (141.9 kg)     Objective:    Vital Signs:  BP 114/66   Pulse 63   Ht 6' (1.829 m)   Wt (!) 304 lb (137.9 kg)   SpO2 96%   BMI 41.23 kg/m     General: Well developed, well nourished, NAD  HEENT: OP clear, mucus membranes moist  SKIN: warm, dry. No rashes. Neuro: No focal deficits  Musculoskeletal: Muscle strength 5/5 all ext  Psychiatric: Mood and affect normal  Neck: No JVD, no carotid bruits, no thyromegaly, no lymphadenopathy.  Lungs:Clear  bilaterally, no wheezes, rhonci, crackles Cardiovascular: Regular rate and rhythm. No murmurs, gallops or rubs. Abdomen:Soft. Bowel sounds present. Non-tender.  Extremities: No lower extremity edema. Pulses are 2 + in the bilateral DP/PT.  EKG is not performed today  The EKG is personally reviewed and shows  ASSESSMENT & PLAN:     1. Chronic Diastolic CHF: Weight is stable. No volume overload on exam. Continue torsemide.   2. Paroxysmal Atrial Fibrillation: Ssnus today. Will continue beta blocker, Flecainide and Eliquis. He is followed in the EP clinic by Dr. [name].     3. Hypertension: BP is controlled. No changes today  4. CAD without angina: He has no chest pain. No ASA since he is also on Eliquis. Continue statin and beta blocker.   Medication Adjustments/Labs and Tests Ordered: Current medicines are reviewed at length with the patient today.  Concerns regarding medicines are outlined above.  Tests Ordered: No orders of the defined types were placed in this encounter.  Medication Changes: No orders of the defined types were placed in this encounter.   Disposition:  Follow up in 12 month(s)  Signed, Lauree Chandler, MD  07/04/2020 2:03 PM

## 2020-07-21 DIAGNOSIS — M1611 Unilateral primary osteoarthritis, right hip: Secondary | ICD-10-CM | POA: Diagnosis not present

## 2020-07-21 DIAGNOSIS — M25551 Pain in right hip: Secondary | ICD-10-CM | POA: Diagnosis not present

## 2020-07-22 IMAGING — CR CHEST - 2 VIEW
2 series · 2 of 2 positions shown · non-contrast
Comparison: 01/26/2017

CLINICAL DATA: Shortness of breath for 2-3 days, negative [id number]
test

EXAM:
CHEST - 2 VIEW

[chest pa]
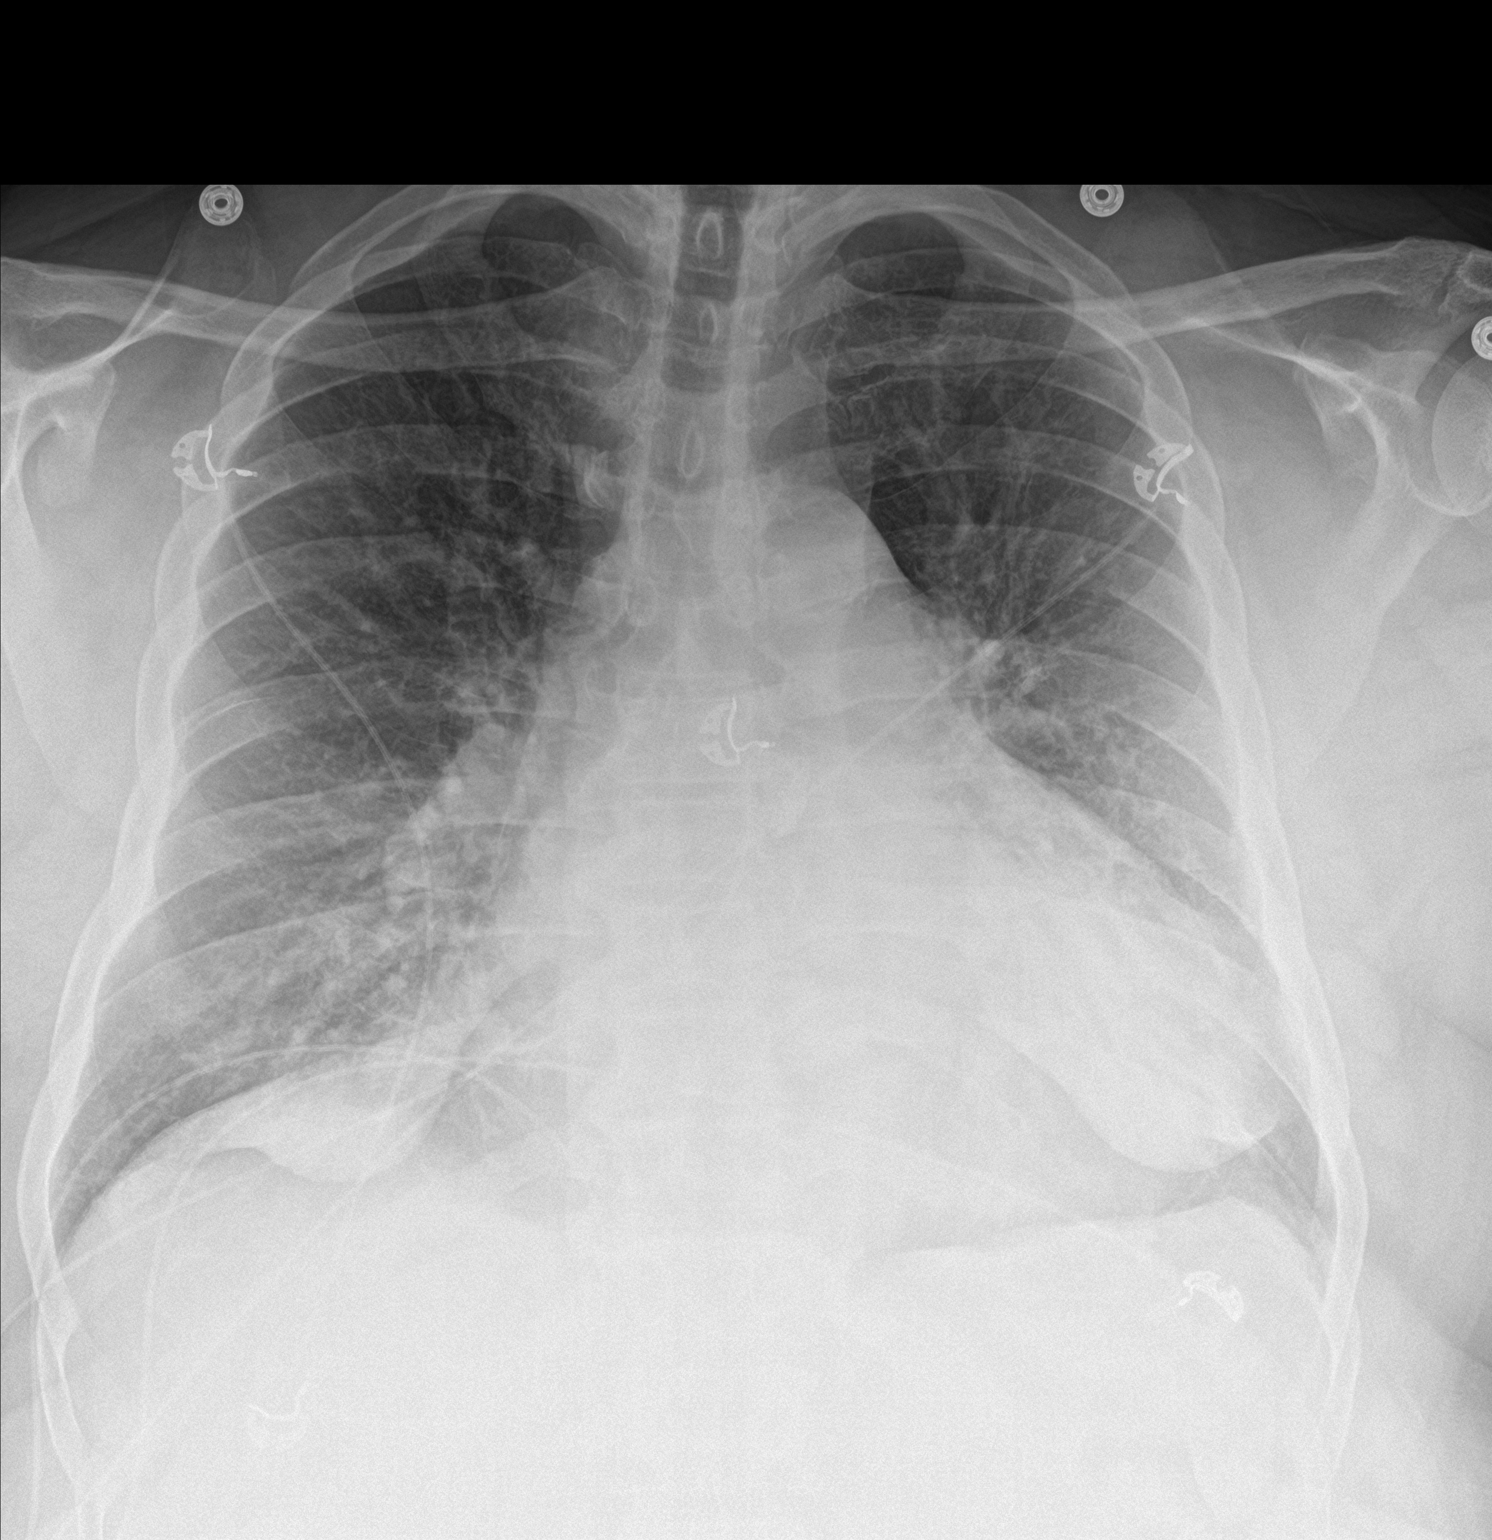

[chest lat]
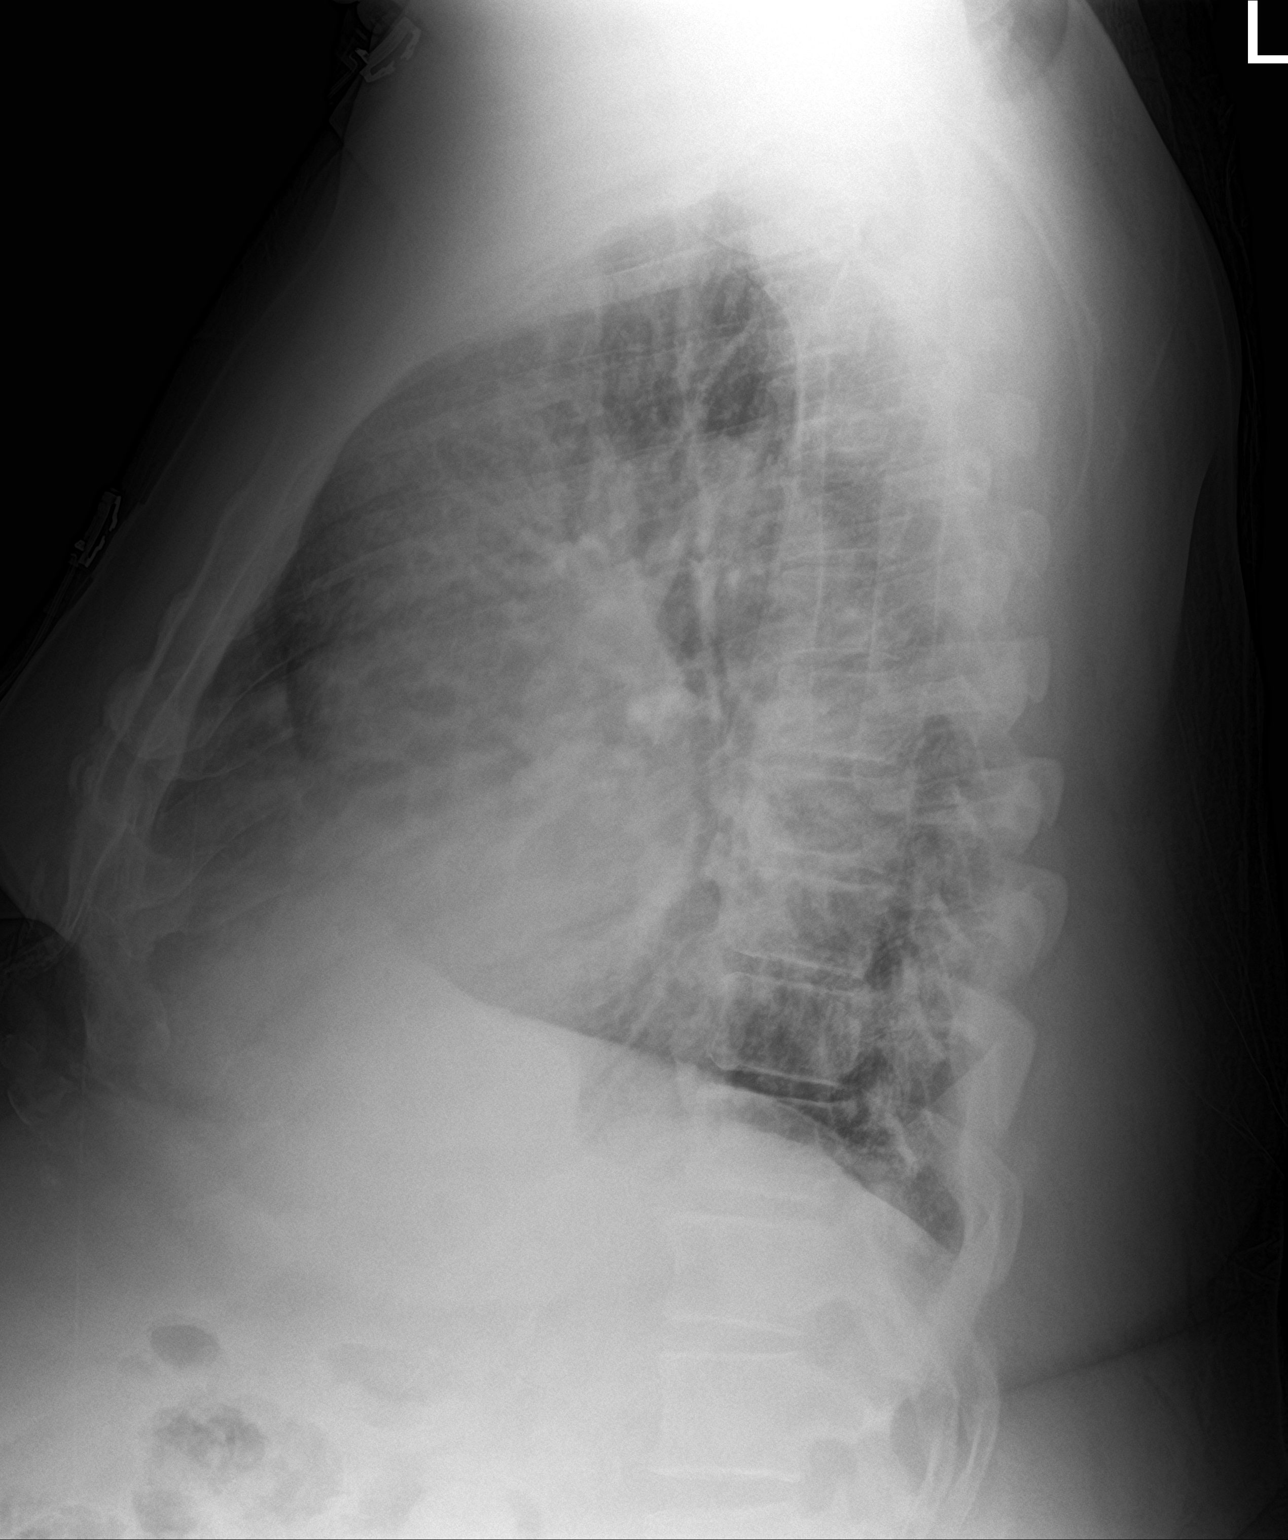

[2 of 2 positions shown; findings below may reference images not displayed]

FINDINGS: Cardiac shadow is enlarged but stable. Vascular congestion is noted
slightly greater than that seen on the prior exam although no edema
is seen. No effusion is noted. No bony abnormality is seen.
IMPRESSION: Slight increase in vascular congestion without definitive edema.

## 2020-07-25 ENCOUNTER — Encounter: Payer: Self-pay | Admitting: Student in an Organized Health Care Education/Training Program

## 2020-07-25 ENCOUNTER — Ambulatory Visit (INDEPENDENT_AMBULATORY_CARE_PROVIDER_SITE_OTHER): Payer: Medicare Other | Admitting: Student in an Organized Health Care Education/Training Program

## 2020-07-25 ENCOUNTER — Other Ambulatory Visit: Payer: Self-pay

## 2020-07-25 VITALS — BP 125/66 | HR 55 | Temp 97.8°F | Wt 310.9 lb

## 2020-07-25 DIAGNOSIS — I10 Essential (primary) hypertension: Secondary | ICD-10-CM

## 2020-07-25 DIAGNOSIS — E1140 Type 2 diabetes mellitus with diabetic neuropathy, unspecified: Secondary | ICD-10-CM | POA: Diagnosis not present

## 2020-07-25 DIAGNOSIS — Z23 Encounter for immunization: Secondary | ICD-10-CM | POA: Diagnosis not present

## 2020-07-25 DIAGNOSIS — Z79891 Long term (current) use of opiate analgesic: Secondary | ICD-10-CM | POA: Diagnosis not present

## 2020-07-25 DIAGNOSIS — N521 Erectile dysfunction due to diseases classified elsewhere: Secondary | ICD-10-CM

## 2020-07-25 MED ORDER — OXYCODONE-ACETAMINOPHEN 10-325 MG PO TABS: 1.0000 | ORAL_TABLET | Freq: Four times a day (QID) | ORAL | 0 refills | Status: DC | PRN

## 2020-07-25 NOTE — Progress Notes (Signed)
   Assessment and Plan:  See Encounters tab for problem-based medical decision making.   __________________________________________________________  HPI:   64 year old person living with diabetes, atrial fibrillation, diffuse osteoarthritis, and obesity here for follow-up of hypertension.  He has had some elevated blood pressures at our recent visits, but 6 weeks ago we started losartan 50 mg daily.  He was able to start this medication without difficulty.  Has had no adverse side effects from meds.  Not checking any blood pressures at home.  Denies any orthostatic symptoms.  No chest pain or shortness of breath.  Reports good adherence with all his other medications.  He had a home sleep study completed in early October and says that the sleep apnea clinic has ordered him a CPAP machine to start using soon.  Reports some increasing pain in his hip, managed by orthopedics.  Doing well with oxycodone a few times a day to improve his functional status.  __________________________________________________________  Problem List: Patient Active Problem List   Diagnosis Date Noted  . Chronic use of opiate drug for therapeutic purpose 08/23/2017    Priority: High  . Paroxysmal atrial fibrillation (HCC) 10/25/2015    Priority: High  . 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 ([place]) 04/25/2013    Priority: High  . Severe obesity with body mass index (BMI) of 35.0 to 39.9 with comorbidity ([place]) 04/25/2013    Priority: High  . Alcohol use disorder 02/28/2015    Priority: Medium  . Obstructive sleep apnea 06/01/2013    Priority: Medium  . Osteoarthritis cervical spine 04/25/2013    Priority: Medium  . Hyperlipidemia 04/25/2013    Priority: Medium  . Coronary artery disease involving native coronary artery with angina pectoris ([place]) 04/25/2013    Priority: Medium  . COPD (chronic obstructive pulmonary disease) ([place]) 04/25/2013    Priority: Medium  . Chronic  diastolic heart failure ([place]) 02/04/2012    Priority: Medium  . Essential hypertension 09/20/2011    Priority: Medium  . Tubular adenoma of colon 11/22/2017    Priority: Low  . C6 radiculopathy 01/24/2016    Priority: Low  . Constipation due to opioid therapy 12/24/2014    Priority: Low  . Post-traumatic osteoarthritis of left knee 06/19/2013    Priority: Low  . Gastroesophageal reflux disease without esophagitis 04/25/2013    Priority: Low  . Open-angle glaucoma 04/25/2013    Priority: Low  . Idiopathic chronic gout without tophus 04/25/2013    Priority: Low  . Healthcare maintenance 01/15/2013    Priority: Low    Medications: Reconciled today in Epic __________________________________________________________  Physical Exam:  Vital Signs: Vitals:   07/25/20 1020  BP: 125/66  Pulse: (!) 55  Temp: 97.8 F (36.6 C)  TempSrc: Oral  SpO2: 98%  Weight: (!) 310 lb 14.4 oz (141 kg)    Gen: Well appearing, NAD CV: RRR, no murmurs Pulm: Normal effort, CTA throughout, no wheezing Ext: Warm, trace pitting edema bilaterally

## 2020-07-25 NOTE — Assessment & Plan Note (Signed)
Blood pressure much improved since starting losartan 50 mg daily, will check a BMP today and as long as renal function and potassium are okay can continue with this medication.

## 2020-07-25 NOTE — Assessment & Plan Note (Signed)
Currently well controlled.  We will get a foot exam today.  Also will give a Prevnar.

## 2020-07-25 NOTE — Assessment & Plan Note (Signed)
Chronic pain generators are stable.  Doing well on current dosing of oxycodone, no adverse side effects, seems to be improving his functional status well.  He asked about a upright walker to help with his mobility, unfortunately in our experience usually not covered by insurance, so we talked about strategies that he could purchase 1 of these from a retailer.  We will continue with the oxycodone, last tox assure from April was appropriate.  I reviewed the PDMP which was appropriate.  I sent 3 months of refills of the oxycodone to his pharmacy.

## 2020-07-26 LAB — BMP8+ANION GAP
Anion Gap: 14 mmol/L (ref 10.0–18.0)
BUN/Creatinine Ratio: 17 (ref 10–24)
BUN: 23 mg/dL (ref 8–27)
CO2: 27 mmol/L (ref 20–29)
Calcium: 9.6 mg/dL (ref 8.6–10.2)
Chloride: 102 mmol/L (ref 96–106)
Creatinine, Ser: 1.33 mg/dL — ABNORMAL HIGH (ref 0.76–1.27)
GFR calc Af Amer: 64 mL/min/{1.73_m2} (ref >59)
GFR calc non Af Amer: 55 mL/min/{1.73_m2} — ABNORMAL LOW (ref >59)
Glucose: 136 mg/dL — ABNORMAL HIGH (ref 65–99)
Potassium: 4.7 mmol/L (ref 3.5–5.2)
Sodium: 143 mmol/L (ref 134–144)

## 2020-07-26 LAB — LIPID PANEL
Chol/HDL Ratio: 2.2 ratio (ref 0.0–5.0)
Cholesterol, Total: 111 mg/dL (ref 100–199)
HDL: 51 mg/dL (ref >39)
LDL Chol Calc (NIH): 36 mg/dL (ref 0–99)
Triglycerides: 142 mg/dL (ref 0–149)
VLDL Cholesterol Cal: 24 mg/dL (ref 5–40)

## 2020-08-11 DIAGNOSIS — G4733 Obstructive sleep apnea (adult) (pediatric): Secondary | ICD-10-CM | POA: Diagnosis not present

## 2020-08-16 ENCOUNTER — Encounter: Payer: Self-pay | Admitting: *Deleted

## 2020-08-16 DIAGNOSIS — E119 Type 2 diabetes mellitus without complications: Secondary | ICD-10-CM | POA: Diagnosis not present

## 2020-08-16 DIAGNOSIS — H1045 Other chronic allergic conjunctivitis: Secondary | ICD-10-CM | POA: Diagnosis not present

## 2020-08-16 DIAGNOSIS — H40013 Open angle with borderline findings, low risk, bilateral: Secondary | ICD-10-CM | POA: Diagnosis not present

## 2020-08-16 DIAGNOSIS — H04123 Dry eye syndrome of bilateral lacrimal glands: Secondary | ICD-10-CM | POA: Diagnosis not present

## 2020-08-16 LAB — HM DIABETES EYE EXAM

## 2020-08-28 ENCOUNTER — Emergency Department (HOSPITAL_COMMUNITY): Payer: Medicare Other

## 2020-08-28 ENCOUNTER — Emergency Department (HOSPITAL_COMMUNITY)
Admission: EM | Admit: 2020-08-28 | Discharge: 2020-08-28 | Disposition: A | Discharge status: Home / Self Care | Payer: Medicare Other | Attending: Emergency Medicine | Admitting: Emergency Medicine

## 2020-08-28 ENCOUNTER — Encounter (HOSPITAL_COMMUNITY): Payer: Self-pay

## 2020-08-28 ENCOUNTER — Other Ambulatory Visit: Payer: Self-pay

## 2020-08-28 DIAGNOSIS — Z79899 Other long term (current) drug therapy: Secondary | ICD-10-CM | POA: Insufficient documentation

## 2020-08-28 DIAGNOSIS — N179 Acute kidney failure, unspecified: Secondary | ICD-10-CM

## 2020-08-28 DIAGNOSIS — R202 Paresthesia of skin: Secondary | ICD-10-CM | POA: Diagnosis not present

## 2020-08-28 DIAGNOSIS — I5032 Chronic diastolic (congestive) heart failure: Secondary | ICD-10-CM | POA: Diagnosis not present

## 2020-08-28 DIAGNOSIS — I25118 Atherosclerotic heart disease of native coronary artery with other forms of angina pectoris: Secondary | ICD-10-CM | POA: Diagnosis not present

## 2020-08-28 DIAGNOSIS — J449 Chronic obstructive pulmonary disease, unspecified: Secondary | ICD-10-CM | POA: Diagnosis not present

## 2020-08-28 DIAGNOSIS — R079 Chest pain, unspecified: Secondary | ICD-10-CM | POA: Diagnosis not present

## 2020-08-28 DIAGNOSIS — E039 Hypothyroidism, unspecified: Secondary | ICD-10-CM | POA: Diagnosis not present

## 2020-08-28 DIAGNOSIS — E1140 Type 2 diabetes mellitus with diabetic neuropathy, unspecified: Secondary | ICD-10-CM | POA: Diagnosis not present

## 2020-08-28 DIAGNOSIS — R0602 Shortness of breath: Secondary | ICD-10-CM | POA: Diagnosis not present

## 2020-08-28 DIAGNOSIS — R001 Bradycardia, unspecified: Secondary | ICD-10-CM | POA: Diagnosis not present

## 2020-08-28 DIAGNOSIS — R42 Dizziness and giddiness: Secondary | ICD-10-CM | POA: Diagnosis not present

## 2020-08-28 DIAGNOSIS — R200 Anesthesia of skin: Secondary | ICD-10-CM | POA: Diagnosis not present

## 2020-08-28 DIAGNOSIS — Z7984 Long term (current) use of oral hypoglycemic drugs: Secondary | ICD-10-CM | POA: Insufficient documentation

## 2020-08-28 DIAGNOSIS — I110 Hypertensive heart disease with heart failure: Secondary | ICD-10-CM | POA: Insufficient documentation

## 2020-08-28 LAB — CBC
HCT: 37.1 % — ABNORMAL LOW (ref 39.0–52.0)
Hemoglobin: 11.9 g/dL — ABNORMAL LOW (ref 13.0–17.0)
MCH: 31.3 pg (ref 26.0–34.0)
MCHC: 32.1 g/dL (ref 30.0–36.0)
MCV: 97.6 fL (ref 80.0–100.0)
Platelets: 186 10*3/uL (ref 150–400)
RBC: 3.8 MIL/uL — ABNORMAL LOW (ref 4.22–5.81)
RDW: 14.4 % (ref 11.5–15.5)
WBC: 8.2 10*3/uL (ref 4.0–10.5)
nRBC: 0 % (ref 0.0–0.2)

## 2020-08-28 LAB — TROPONIN I (HIGH SENSITIVITY)
Troponin I (High Sensitivity): 27 ng/L — ABNORMAL HIGH (ref <18)
Troponin I (High Sensitivity): 24 ng/L — ABNORMAL HIGH (ref <18)

## 2020-08-28 LAB — BASIC METABOLIC PANEL
Anion gap: 13 (ref 5–15)
BUN: 28 mg/dL — ABNORMAL HIGH (ref 8–23)
CO2: 21 mmol/L — ABNORMAL LOW (ref 22–32)
Calcium: 7.2 mg/dL — ABNORMAL LOW (ref 8.9–10.3)
Chloride: 106 mmol/L (ref 98–111)
Creatinine, Ser: 1.96 mg/dL — ABNORMAL HIGH (ref 0.61–1.24)
GFR, Estimated: 37 mL/min — ABNORMAL LOW (ref >60)
Glucose, Bld: 127 mg/dL — ABNORMAL HIGH (ref 70–99)
Potassium: 3.5 mmol/L (ref 3.5–5.1)
Sodium: 140 mmol/L (ref 135–145)

## 2020-08-28 LAB — PROTIME-INR
INR: 1.3 — ABNORMAL HIGH (ref 0.8–1.2)
Prothrombin Time: 15.5 seconds — ABNORMAL HIGH (ref 11.4–15.2)

## 2020-08-28 LAB — FOLATE: Folate: 13.2 ng/mL (ref >5.9)

## 2020-08-28 MED ORDER — FOLIC ACID 1 MG PO TABS
1.0000 mg | ORAL_TABLET | Freq: Once | ORAL | Status: AC
  Administered 2020-08-28: 1 mg via ORAL
  Filled 2020-08-28: qty 1

## 2020-08-28 MED ORDER — THIAMINE HCL 100 MG PO TABS
100.0000 mg | ORAL_TABLET | Freq: Every day | ORAL | Status: DC
  Administered 2020-08-28: 100 mg via ORAL
  Filled 2020-08-28: qty 1

## 2020-08-28 MED ORDER — LACTATED RINGERS IV BOLUS
1000.0000 mL | Freq: Once | INTRAVENOUS | Status: AC
  Administered 2020-08-28: 1000 mL via INTRAVENOUS

## 2020-08-28 NOTE — ED Triage Notes (Signed)
Patient complains of several days of bilateral arm and leg numbness with dizziness and chest pain. On arrival no cp. Patient also complains of intermittent headaches. NAD

## 2020-08-28 NOTE — Discharge Instructions (Addendum)
Stop taking the Losartan 50mg  until you see your doctor either tomorrow or Tuesday.  Continue using the cpap machine

## 2020-08-28 NOTE — ED Provider Notes (Signed)
[facility] EMERGENCY DEPARTMENT Provider Note   CSN: [id number] Arrival date & time: 08/28/20  0935     History No chief complaint on file.   Juan Calderon is a 64 y.o. male.  Patient is a 64 year old male with a history of diabetes, atrial fibrillation on eliquis, diffuse osteoarthritis on oxycodone, COPD, CAD, HTN and obesity who approximately 2 months ago was started on a new blood pressure medicine losartan 50 mg and last week started using CPAP for the first time.  Patient presents today with multiple complaints.  He reports that for about a week he has had worsening dizziness with standing when he tries to walk and do anything, occasional chest pain that moves around in his chest but seems to be most significant when he is laying down at night with the CPAP machine on and neck pain and headaches.  He reports the neck pain and headaches come and go and he does not have one right now but they have seen more frequent since starting the CPAP.  He reports that intermittently he feels tingling in his hands and feet bilaterally but does not have any numbness up in his legs arms chest or abdomen.  He has been eating and drinking normally.  He has been taking his medication as prescribed.  He denies any nausea, vomiting, cough or fever.  He has had no diarrhea.  He denies any numbness or chest pain at this time.  He reports that he does not check his sugar daily and he has not really been checking his blood pressures at home since starting the new medication.  He is still drinking beer daily and denies any tobacco use or drug use.        Past Medical History:  Diagnosis Date  . Alcohol abuse    . Bradycardia   . C6 radiculopathy 01/24/2016   Right upper extremity, mild to moderate electrically by EMG on 01/24/2016  . Cataract    Left eye  . Chronic diastolic heart failure ([place])     with mild left ventricular hypertrophy on Echo 02/2010  . Chronic obstructive pulmonary  disease ([place])    . Chronic osteomyelitis of femur ([place]) 04/06/2016  . Chronic osteomyelitis of left femur ([place]) 11/22/2017   Left femur s/p prior trauma  . Chronic osteomyelitis of left femur ([place]) 11/22/2017   Brodie's abscess: left femur s/p prior trauma.  Underwent partial excision and curettage of left femoral osteomyelitis at [facility] 12/30/2017 with grossly purulent material encountered within the medullary canal of the left distal femur.  Cultures grew MSSA.  Post-operatively received 6 weeks of IV antibiotics through 02/10/2018.  CRP elevated at 60.3 at end of IV antibiotic course so continued on Keflex  . Chronic pain syndrome     Left arm and leg s/p traumatic injury   . Chronic renal insufficiency    . Coronary artery disease     25% LAD stenosis on cath 2007.  Stable angina.  . Diverticulosis    . Diverticulosis 11/12/2013  . Essential hypertension    . Frequent PVCs   . Gastroesophageal reflux disease    . Gout    . Hyperlipidemia LDL goal < 100    . Internal hemorrhoids without complication 75/17/0017  . Long-term current use of opiate analgesic 09/07/2016  . Mild carpal tunnel syndrome of right wrist 01/24/2016   Mild degree electrically per EMG 01/24/2016   . Mild carpal tunnel syndrome of right wrist 01/24/2016  Mild degree electrically per EMG 01/24/2016   . Morbid obesity with BMI of 40.0-44.9, adult (HCC)    . Normocytic anemia    . NSVT (nonsustained ventricular tachycardia) (HCC)   . Obstructive sleep apnea     Moderate, AHI 29.8 per hour with moderately loud snoring and oxygen desaturation to a nadir of 79%. CPAP titration resulted in a prescription for 17 CWP.    [name] Open-angle glaucoma    . Osteoarthritis cervical spine    . Osteoarthritis of left knee 06/19/2013   Tricompartmental disease.  Treated with double hinged upright knee brace, steroid/xylocaine knee injections, and NSAIDs   . Osteoporosis 05/14/2017   s/p fracture of the right humerus from a fall at ground hight  .  Persistent atrial fibrillation (HCC)   . Right rotator cuff tear     Large full-thickness tear of the supraspinatus with mild retraction but no atrophy   . Right rotator cuff tear 04/25/2013   Large full-thickness tear of the supraspinatus with mild retraction but no atrophy    . Secondary male hypogonadism 02/07/2017   Likely secondary to chronic opioid use  . Secondary male hypogonadism 02/07/2017   Likely secondary to chronic opioid use  . Subclinical hypothyroidism    . Tubular adenoma of colon 11/22/2017   Specifics unknown.  Repeat colonoscopy 08/12/2018 with six 3-6 mm tubular adenomas removed endoscopically.  . 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 (HCC)    . Vasomotor rhinitis 04/25/2013    Patient Active Problem List   Diagnosis Date Noted  . Tubular adenoma of colon 11/22/2017  . Chronic use of opiate drug for therapeutic purpose 08/23/2017  . C6 radiculopathy 01/24/2016  . Paroxysmal atrial fibrillation (HCC) 10/25/2015  . Alcohol use disorder 02/28/2015  . Constipation due to opioid therapy 12/24/2014  . Post-traumatic osteoarthritis of left knee 06/19/2013  . Obstructive sleep apnea 06/01/2013  . Osteoarthritis cervical spine 04/25/2013  . Gastroesophageal reflux disease without esophagitis 04/25/2013  . Open-angle glaucoma 04/25/2013  . Hyperlipidemia 04/25/2013  . 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 (HCC) 04/25/2013  . Coronary artery disease involving native coronary artery with angina pectoris (HCC) 04/25/2013  . COPD (chronic obstructive pulmonary disease) (HCC) 04/25/2013  . Idiopathic chronic gout without tophus 04/25/2013  . Severe obesity with body mass index (BMI) of 35.0 to 39.9 with comorbidity (HCC) 04/25/2013  . Healthcare maintenance 01/15/2013  . Chronic diastolic heart failure (HCC) 02/04/2012  . Essential hypertension 09/20/2011    Past Surgical History:  Procedure Laterality Date  .  A-FLUTTER ABLATION N/A 03/24/2019   Procedure: A-FLUTTER ABLATION;  Surgeon: Marinus Maw, MD;  Location: [facility] INVASIVE CV LAB;  Service: Cardiovascular;  Laterality: N/A;  . CARDIOVERSION N/A 12/30/2014   Procedure: CARDIOVERSION;  Surgeon: Chrystie Nose, MD;  Location: [facility] ENDOSCOPY;  Service: Cardiovascular;  Laterality: N/A;  . FRACTURE SURGERY Left 1980's   Elbow  . Left arm surgery    . Left leg surgery    . SHOULDER SURGERY     Right       Family History  Problem Relation Age of Onset  . Heart failure Mother   . Asthma Mother   . Alzheimer's disease Father   . Hypertension Sister   . Hypertension Sister   . Hypertension Sister   . Cancer Brother        unknown  . Early death Brother        Child psychotherapist  .  Cancer Brother        prostate  . Prostate cancer Brother   . Heart attack Neg Hx   . Stroke Neg Hx   . Colon cancer Neg Hx   . Esophageal cancer Neg Hx   . Pancreatic cancer Neg Hx   . Stomach cancer Neg Hx   . Liver disease Neg Hx     Social History   Tobacco Use  . Smoking status: Never Smoker  . Smokeless tobacco: Never Used  Vaping Use  . Vaping Use: Never used  Substance Use Topics  . Alcohol use: Yes    Alcohol/week: 14.0 standard drinks    Types: 14 Cans of beer per week  . Drug use: No    Home Medications Prior to Admission medications   Medication Sig Start Date End Date Taking? Authorizing Provider  albuterol (VENTOLIN HFA) 108 (90 Base) MCG/ACT inhaler Inhale 1-2 puffs into the lungs every 6 (six) hours as needed for shortness of breath. 06/29/19   Axel Filler, MD  allopurinol (ZYLOPRIM) 300 MG tablet TAKE 1 TABLET (300 MG TOTAL) BY MOUTH DAILY. PLEASE NOTE THE CHANGE IN THE NUMBER OF TABLETS TO TAKE DAILY. 03/04/20   Axel Filler, MD  atorvastatin (LIPITOR) 20 MG tablet TAKE 1 TABLET BY MOUTH EVERY DAY 05/05/20   Axel Filler, MD  Blood Glucose Monitoring Suppl  [facility] VERIO) w/Device KIT 1 each by Does not  apply route daily. 11/04/15   Loleta Chance, MD  ELIQUIS 5 MG TABS tablet TAKE 1 TABLET BY MOUTH TWICE A DAY 07/04/20   Axel Filler, MD  flecainide (TAMBOCOR) 100 MG tablet Take 1 tablet (100 mg total) by mouth every 12 (twelve) hours. 10/16/19   Axel Filler, MD  glucose blood [facility] VERIO) test strip Use as instructed 05/24/16   Oval Linsey, MD  latanoprost (XALATAN) 0.005 % ophthalmic solution Place 1 drop into both eyes at bedtime. 10/11/16   Oval Linsey, MD  losartan (COZAAR) 50 MG tablet Take 1 tablet (50 mg total) by mouth daily. 06/06/20   Axel Filler, MD  magnesium oxide (MAG-OX) 400 MG tablet Take 400 mg by mouth daily.     [provider]  metFORMIN (GLUCOPHAGE) 1000 MG tablet Take 1 tablet (1,000 mg total) by mouth 2 (two) times daily with a meal. 10/16/19   Axel Filler, MD  metoprolol tartrate (LOPRESSOR) 25 MG tablet TAKE 1 TABLET BY MOUTH TWICE A DAY 04/06/20   Axel Filler, MD  nitroGLYCERIN (NITROSTAT) 0.4 MG SL tablet Place 1 tablet (0.4 mg total) under the tongue every 5 (five) minutes as needed for chest pain. 07/18/18   Oval Linsey, MD  Olopatadine HCl 0.2 % SOLN Place 1 drop into both eyes daily.    [provider]  Jonetta Speak LANCETS 24M MISC Use 1 strip daily 05/24/16   Oval Linsey, MD  oxyCODONE-acetaminophen (PERCOCET) 10-325 MG tablet Take 1 tablet by mouth every 6 (six) hours as needed for pain. 07/25/20 07/25/21  Axel Filler, MD  pantoprazole (PROTONIX) 40 MG tablet Take 1 tablet (40 mg total) by mouth 2 (two) times daily. 12/31/19   Irene Shipper, MD  Propylene Glycol (SYSTANE BALANCE OP) Place 1 drop into both eyes daily.    [provider]  Sorbitol 70 % SOLN Take 15-60 mLs by mouth daily as needed. Patient taking differently: Take 15-60 mLs by mouth daily as needed (for constipation).  12/28/19   Axel Filler, MD  terazosin (HYTRIN) 5 MG capsule TAKE 1  CAPSULE (5 MG TOTAL) BY MOUTH AT BEDTIME. 04/06/20   Axel Filler, MD  torsemide (DEMADEX) 20 MG tablet Take 1 tablet (20 mg total) by mouth daily. 03/14/20   Axel Filler, MD    Allergies    Ramipril, Tape, and Testosterone  Review of Systems   Review of Systems  All other systems reviewed and are negative.   Physical Exam Updated Vital Signs BP 99/64   Pulse (!) 55   Temp 98.2 F (36.8 C)   Resp 13   SpO2 98%   Physical Exam Vitals and nursing note reviewed.  Constitutional:      General: He is not in acute distress.    Appearance: Normal appearance. He is well-developed and well-nourished. He is obese.  HENT:     Head: Normocephalic and atraumatic.     Right Ear: Tympanic membrane normal.     Left Ear: Tympanic membrane normal.     Nose: Nose normal.     Mouth/Throat:     Mouth: Oropharynx is clear and moist.  Eyes:     Extraocular Movements: EOM normal.     Conjunctiva/sclera: Conjunctivae normal.     Pupils: Pupils are equal, round, and reactive to light.  Cardiovascular:     Rate and Rhythm: Normal rate. Rhythm regularly irregular.     Pulses: Intact distal pulses.     Heart sounds: No murmur heard.   Pulmonary:     Effort: Pulmonary effort is normal. No respiratory distress.     Breath sounds: Normal breath sounds. No wheezing or rales.  Chest:     Chest wall: No tenderness.  Abdominal:     General: There is no distension.     Palpations: Abdomen is soft.     Tenderness: There is no abdominal tenderness. There is no guarding or rebound.  Musculoskeletal:        General: No tenderness or edema. Normal range of motion.     Cervical back: Normal range of motion and neck supple. No tenderness.     Right lower leg: No edema.     Comments: Multiple scars present over the left lower extremity with minimal edema and chronic skin changes  Skin:    General: Skin is warm and dry.     Capillary Refill: Capillary refill takes less than 2 seconds.      Findings: No erythema or rash.  Neurological:     General: No focal deficit present.     Mental Status: He is alert and oriented to person, place, and time. Mental status is at baseline.     Sensory: No sensory deficit.     Motor: No weakness.     Gait: Gait normal.     Comments: Currently normal sensation in the hands and the feet  Psychiatric:        Mood and Affect: Mood and affect and mood normal.        Behavior: Behavior normal.        Thought Content: Thought content normal.     ED Results / Procedures / Treatments   Labs (all labs ordered are listed, but only abnormal results are displayed) Labs Reviewed  BASIC METABOLIC PANEL - Abnormal; Notable for the following components:      Result Value   CO2 21 (*)    Glucose, Bld 127 (*)    BUN 28 (*)    Creatinine, Ser 1.96 (*)    Calcium  7.2 (*)    GFR, Estimated 37 (*)    All other components within normal limits  CBC - Abnormal; Notable for the following components:   RBC 3.80 (*)    Hemoglobin 11.9 (*)    HCT 37.1 (*)    All other components within normal limits  PROTIME-INR - Abnormal; Notable for the following components:   Prothrombin Time 15.5 (*)    INR 1.3 (*)    All other components within normal limits  TROPONIN I (HIGH SENSITIVITY) - Abnormal; Notable for the following components:   Troponin I (High Sensitivity) 27 (*)    All other components within normal limits  TROPONIN I (HIGH SENSITIVITY) - Abnormal; Notable for the following components:   Troponin I (High Sensitivity) 24 (*)    All other components within normal limits  FOLATE  VITAMIN B1    EKG EKG Interpretation  Date/Time:  Sunday August 28 2020 09:41:37 EST Ventricular Rate:  64 PR Interval:  210 QRS Duration: 110 QT Interval:  478 QTC Calculation: 493 R Axis:   -2 Text Interpretation: Sinus rhythm with 1st degree A-V block Nonspecific ST and T wave abnormality Prolonged QT twave inversion more pronounced lateral compared with  EKG on 7/20 Confirmed by Blanchie Dessert ([id number]) on 08/28/2020 3:28:58 PM   Radiology DG Chest 2 View  Result Date: 08/28/2020 CLINICAL DATA:  Shortness of breath, left-sided chest pain EXAM: CHEST - 2 VIEW COMPARISON:  02/23/2020 FINDINGS: Cardiac shadow is enlarged. Aortic calcifications are noted. Lungs are well aerated bilaterally. No focal infiltrate or sizable effusion is seen. Previously seen vascular congestion has resolved. No bony abnormality is seen. IMPRESSION: No acute abnormality noted. Electronically Signed   By: Inez Catalina M.D.   On: 08/28/2020 10:18   CT Head Wo Contrast  Result Date: 08/28/2020 CLINICAL DATA:  Numbness and tingles.  Paresthesias EXAM: CT HEAD WITHOUT CONTRAST TECHNIQUE: Contiguous axial images were obtained from the base of the skull through the vertex without intravenous contrast. COMPARISON:  None. FINDINGS: Brain: No acute intracranial hemorrhage. No focal mass lesion. No CT evidence of acute infarction. No midline shift or mass effect. No hydrocephalus. Basilar cisterns are patent. Vascular: No hyperdense vessel or unexpected calcification. Skull: Normal. Negative for fracture or focal lesion. Sinuses/Orbits: Paranasal sinuses and mastoid air cells are clear. Orbits are clear. Other: None. IMPRESSION: No acute intracranial findings. Electronically Signed   By: Suzy Bouchard M.D.   On: 08/28/2020 16:58    Procedures Procedures (including critical care time)  Medications Ordered in ED Medications - No data to display  ED Course  I have reviewed the triage vital signs and the nursing notes.  Pertinent labs & imaging results that were available during my care of the patient were reviewed by me and considered in my medical decision making (see chart for details).    MDM Rules/Calculators/A&P                          Patient is a 63 year old male presenting today with multiple vague complaints.  He is complaining of a chest pain last night for  approximately an hour that moves between his left and right chest and was worse depending what side he was laying on while he had the CPAP machine on.  He did not have associated symptoms.  The pain did not radiate and he has not had any pain today.  Unclear the cause of this pain but lower suspicion for ACS  at this time.  Patient has not noticed recent chest pain with exertion but has noticed more dizziness with standing in the last week since starting his CPAP.  He was started on losartan 50 mg approximately 8 weeks ago and per his doctor's note blood pressures had improved however here patient's blood pressure is in the high 90s and suspect he may be having a component of hypotension which may be causing the dizziness and headaches.  He denies any infectious symptoms and is otherwise well-appearing.  He also is having some numbness intermittently in the stocking glove distribution.  Patient reports his A1c's are usually less than 7 but he does not check his blood sugar regularly.  Also patient continues to drink beer daily and concern for possible vitamin deficiency.  A focally acid and thiamine level were sent.  Patient was given oral replacement.  He was given an IV bolus of fluid given lower blood pressures.  On labs today blood sugar is relatively controlled at 127, he has new elevation of his creatinine at 1.96 from 1.3 about 1 month ago, CBC with stable hemoglobin and normal white count.  Troponin today is elevated at 27 which is improved from prior numbers in the 40s and PT/INR without acute findings.  Chest x-ray also within normal limits.  Because patient is complaining of a headache, dizziness with standing, intermittent numbness we will do a CT to ensure no recent stroke or bleed.  However feel that this is most likely blood pressure related also could be related to vitamin deficiencies.  Once remaining imaging and labs return will discuss with his PCP which is in the internal medicine resident  clinic.  7:07 PM Thiamine is still pending but folic acid is normal.  Head CT neg and delta trop is flat at 24.  Pt well appearing and BP last was 107/71.  Spoke with pt's PCP internal med teaching service and they will call the pt tomorrow for follow in a day or two.  He will stop losartan at this time until further evaluation.  MDM Number of Diagnoses or Management Options   Amount and/or Complexity of Data Reviewed Clinical lab tests: ordered and reviewed Tests in the radiology section of CPT: ordered and reviewed Tests in the medicine section of CPT: ordered and reviewed Decide to obtain previous medical records or to obtain history from someone other than the patient: yes Obtain history from someone other than the patient: yes Review and summarize past medical records: yes Discuss the patient with other providers: yes Independent visualization of images, tracings, or specimens: yes  Risk of Complications, Morbidity, and/or Mortality Presenting problems: moderate Diagnostic procedures: low Management options: low  Patient Progress Patient progress: stable   Final Clinical Impression(s) / ED Diagnoses Final diagnoses:  AKI (acute kidney injury) ([place])  Paresthesias  Dizziness    Rx / DC Orders ED Discharge Orders    None       Blanchie Dessert, MD 08/28/20 1909

## 2020-08-29 ENCOUNTER — Telehealth: Payer: Self-pay

## 2020-08-29 NOTE — Telephone Encounter (Signed)
Pt has a follow appt from an ED  Visit

## 2020-08-30 ENCOUNTER — Other Ambulatory Visit: Payer: Self-pay | Admitting: Student in an Organized Health Care Education/Training Program

## 2020-08-30 ENCOUNTER — Encounter: Payer: Self-pay | Admitting: Internal Medicine

## 2020-08-30 ENCOUNTER — Ambulatory Visit (INDEPENDENT_AMBULATORY_CARE_PROVIDER_SITE_OTHER): Payer: Medicare Other | Admitting: Internal Medicine

## 2020-08-30 VITALS — BP 110/69 | HR 62 | Temp 98.0°F | Ht 72.0 in | Wt 300.2 lb

## 2020-08-30 DIAGNOSIS — N521 Erectile dysfunction due to diseases classified elsewhere: Secondary | ICD-10-CM

## 2020-08-30 DIAGNOSIS — E1140 Type 2 diabetes mellitus with diabetic neuropathy, unspecified: Secondary | ICD-10-CM

## 2020-08-30 DIAGNOSIS — I480 Paroxysmal atrial fibrillation: Secondary | ICD-10-CM

## 2020-08-30 DIAGNOSIS — G609 Hereditary and idiopathic neuropathy, unspecified: Secondary | ICD-10-CM | POA: Diagnosis not present

## 2020-08-30 DIAGNOSIS — N179 Acute kidney failure, unspecified: Secondary | ICD-10-CM | POA: Diagnosis not present

## 2020-08-30 DIAGNOSIS — R200 Anesthesia of skin: Secondary | ICD-10-CM | POA: Diagnosis not present

## 2020-08-30 DIAGNOSIS — R202 Paresthesia of skin: Secondary | ICD-10-CM | POA: Diagnosis not present

## 2020-08-30 DIAGNOSIS — G629 Polyneuropathy, unspecified: Secondary | ICD-10-CM | POA: Insufficient documentation

## 2020-08-30 DIAGNOSIS — Z7689 Persons encountering health services in other specified circumstances: Secondary | ICD-10-CM | POA: Diagnosis not present

## 2020-08-30 LAB — POCT GLYCOSYLATED HEMOGLOBIN (HGB A1C): Hemoglobin A1C: 6.4 % — AB (ref 4.0–5.6)

## 2020-08-30 LAB — GLUCOSE, CAPILLARY: Glucose-Capillary: 112 mg/dL — ABNORMAL HIGH (ref 70–99)

## 2020-08-30 NOTE — Patient Instructions (Signed)
Dear Juan Calderon,  Thank you for allowing [place] to provide your care today. Today we discussed your numbness and tingling    I have ordered bmp, hgb a1c, tsh, vitamin b12 and urine analysis labs for you. I will call if any are abnormal.    Today we made no changes to your medications:    Please follow-up in 4 weeks.    Please call the internal medicine center clinic if you have any questions or concerns, we may be able to help and keep you from a long and expensive emergency room wait. Our clinic and after hours phone number is [phone], the best time to call is Monday through Friday 9 am to 4 pm but there is always someone available 24/7 if you have an emergency. If you need medication refills please notify your pharmacy one week in advance and they will send [place] a request.    If you have not gotten the COVID vaccine, I recommend doing so:  You may get it at your local [facility] or [facility] OR To schedule an appointment for a COVID vaccine or be added to the vaccine wait list: Go to [email or web address]   OR Go to [email or web address]                  OR Call [phone]                                     OR Call [phone] and select Option 2  Thank you for choosing [facility]   Peripheral Neuropathy Peripheral neuropathy is a type of nerve damage. It affects nerves that carry signals between the spinal cord and the arms, legs, and the rest of the body (peripheral nerves). It does not affect nerves in the spinal cord or brain. In peripheral neuropathy, one nerve or a group of nerves may be damaged. Peripheral neuropathy is a broad category that includes many specific nerve disorders, like diabetic neuropathy, hereditary neuropathy, and carpal tunnel syndrome. What are the causes? This condition may be caused by:  Diabetes. This is the most common cause of peripheral neuropathy.  Nerve injury.  Pressure or stress on a nerve that lasts a long time.  Lack  (deficiency) of B vitamins. This can result from alcoholism, poor diet, or a restricted diet.  Infections.  Autoimmune diseases, such as rheumatoid arthritis and systemic lupus erythematosus.  Nerve diseases that are passed from parent to child (inherited).  Some medicines, such as cancer medicines (chemotherapy).  Poisonous (toxic) substances, such as lead and mercury.  Too little blood flowing to the legs.  Kidney disease.  Thyroid disease. In some cases, the cause of this condition is not known. What are the signs or symptoms? Symptoms of this condition depend on which of your nerves is damaged. Common symptoms include:  Loss of feeling (numbness) in the feet, hands, or both.  Tingling in the feet, hands, or both.  Burning pain.  Very sensitive skin.  Weakness.  Not being able to move a part of the body (paralysis).  Muscle twitching.  Clumsiness or poor coordination.  Loss of balance.  Not being able to control your bladder.  Feeling dizzy.  Sexual problems. How is this diagnosed? Diagnosing and finding the cause of peripheral neuropathy can be difficult. Your health care provider will take your medical history and do a physical exam. A neurological exam will also be done. This  involves checking things that are affected by your brain, spinal cord, and nerves (nervous system). For example, your health care provider will check your reflexes, how you move, and what you can feel. You may have other tests, such as:  Blood tests.  Electromyogram (EMG) and nerve conduction tests. These tests check nerve function and how well the nerves are controlling the muscles.  Imaging tests, such as CT scans or MRI to rule out other causes of your symptoms.  Removing a small piece of nerve to be examined in a lab (nerve biopsy). This is rare.  Removing and examining a small amount of the fluid that surrounds the brain and spinal cord (lumbar puncture). This is rare. How is  this treated? Treatment for this condition may involve:  Treating the underlying cause of the neuropathy, such as diabetes, kidney disease, or vitamin deficiencies.  Stopping medicines that can cause neuropathy, such as chemotherapy.  Medicine to relieve pain. Medicines may include: ? Prescription or over-the-counter pain medicine. ? Antiseizure medicine. ? Antidepressants. ? Pain-relieving patches that are applied to painful areas of skin.  Surgery to relieve pressure on a nerve or to destroy a nerve that is causing pain.  Physical therapy to help improve movement and balance.  Devices to help you move around (assistive devices). Follow these instructions at home: Medicines  Take over-the-counter and prescription medicines only as told by your health care provider. Do not take any other medicines without first asking your health care provider.  Do not drive or use heavy machinery while taking prescription pain medicine. Lifestyle   Do not use any products that contain nicotine or tobacco, such as cigarettes and e-cigarettes. Smoking keeps blood from reaching damaged nerves. If you need help quitting, ask your health care provider.  Avoid or limit alcohol. Too much alcohol can cause a vitamin B deficiency, and vitamin B is needed for healthy nerves.  Eat a healthy diet. This includes: ? Eating foods that are high in fiber, such as fresh fruits and vegetables, whole grains, and beans. ? Limiting foods that are high in fat and processed sugars, such as fried or sweet foods. General instructions   If you have diabetes, work closely with your health care provider to keep your blood sugar under control.  If you have numbness in your feet: ? Check every day for signs of injury or infection. Watch for redness, warmth, and swelling. ? Wear padded socks and comfortable shoes. These help protect your feet.  Develop a good support system. Living with peripheral neuropathy can be  stressful. Consider talking with a mental health specialist or joining a support group.  Use assistive devices and attend physical therapy as told by your health care provider. This may include using a walker or a cane.  Keep all follow-up visits as told by your health care provider. This is important. Contact a health care provider if:  You have new signs or symptoms of peripheral neuropathy.  You are struggling emotionally from dealing with peripheral neuropathy.  Your pain is not well-controlled. Get help right away if:  You have an injury or infection that is not healing normally.  You develop new weakness in an arm or leg.  You fall frequently. Summary  Peripheral neuropathy is when the nerves in the arms, or legs are damaged, resulting in numbness, weakness, or pain.  There are many causes of peripheral neuropathy, including diabetes, pinched nerves, vitamin deficiencies, autoimmune disease, and hereditary conditions.  Diagnosing and finding the cause  of peripheral neuropathy can be difficult. Your health care provider will take your medical history, do a physical exam, and do tests, including blood tests and nerve function tests.  Treatment involves treating the underlying cause of the neuropathy and taking medicines to help control pain. Physical therapy and assistive devices may also help. This information is not intended to replace advice given to you by your health care provider. Make sure you discuss any questions you have with your health care provider. Document Revised: 08/02/2017 Document Reviewed: 10/29/2016 Elsevier Patient Education  2020 [employer].

## 2020-08-30 NOTE — Assessment & Plan Note (Signed)
Last hgb a1c checked early October at goal at 7.2. Denies any polyuria, polydipsia, polyphagia  - Hgb a1c today - C/w metformin

## 2020-08-30 NOTE — Assessment & Plan Note (Addendum)
Lab Results  Component Value Date   CREATININE 1.96 (H) 08/28/2020   On ED visit yesterday noted to have elevated creatinine at 1.96 from baselin of~1.2/1.3 Felt to be due to dehydration by ED provider. Treated with LR bolus. Also advised to stop losartan in ED. Denies significant change in urination. Mentions feeling weak after starting use of CPAP. Possibly from insensible losses with CPAP use. Alternatively could be due to worsening diabetic nephropathy. Will re-check today to assess for resolution as well as further work-up with UA  - BMP today - UA

## 2020-08-30 NOTE — Assessment & Plan Note (Addendum)
Juan Calderon is a 64 yo M w/ PMH of T2DM, PAF on eliquis, DJD, chronic pain on oxycodone, COPD, CAD, HTN, and OSA presenting with peripheral numbness and tingling. He was in his usual state of health until couple weeks ago when he developed intermittent numbness in his hands. He mentions onset of symptoms coincide with starting use of his CPAP machine. Denies similar symptoms int he past. He mentions this sensation is intermittent and has no obvious exacerbating factors. About a week ago symptoms worsened with associated chest pain, light-headedness and numbness and tingling in his legs that radiates down. He went to ED for evaluation and was given fluids with discharged with recommendation to f/u with PCP. He mentions that at the moment his symptoms are mostly gone but not completely disappeared.  A/P Present with gradual onset peripheral numbness / tingling. Possible differential includes diabetic neuropathy, vitamin deficiency, medication side effects although no obvious cause on current med list. Diabetes has been well controlled. Folate wnl. Thiamine level pending from ED visit. Does take metformin which is rarely associated with B12 deficiency. Will also get further work-up to rule out thyroid dysfunction.  - TSH, B12 - F/u thiamine level - Advised to monitor symptoms and report if worsening  Addendum: Lab values showing persistent hypocalcemia as well as low B12. Neuropathy likely due to symptomatic hypocalcemia and vitamin b12 deficiency. Will send scripts for repletion and advise to pause metformin until deficiencies have resolved. Also need further work-up for cause of hypocalcemia. Spoke with Juan Calderon and advised to return to clinic for lab visit.

## 2020-08-30 NOTE — Progress Notes (Signed)
CC: Numbness  HPI: [name] is a 64 y.o. with PMH listed below presenting with complaint of numbness. Please see problem based assessment and plan for further details.  Past Medical History:  Diagnosis Date  . Alcohol abuse    . Bradycardia   . C6 radiculopathy 01/24/2016   Right upper extremity, mild to moderate electrically by EMG on 01/24/2016  . Cataract    Left eye  . Chronic diastolic heart failure (HCC)     with mild left ventricular hypertrophy on Echo 02/2010  . Chronic obstructive pulmonary disease (HCC)    . Chronic osteomyelitis of femur (HCC) 04/06/2016  . Chronic osteomyelitis of left femur (HCC) 11/22/2017   Left femur s/p prior trauma  . Chronic osteomyelitis of left femur (HCC) 11/22/2017   Brodie's abscess: left femur s/p prior trauma.  Under[facility] with grossly purulent material encountered within the medullary canal of the left distal femur.  Cultures grew MSSA.  Post-operatively received 6 weeks of IV antibiotics through 02/10/2018.  CRP elevated at 60.3 at end of IV antibiotic course so continued on Keflex  . Chronic pain syndrome     Left arm and leg s/p traumatic injury   . Chronic renal insufficiency    . Coronary artery disease     25% LAD stenosis on cath 2007.  Stable angina.  . Diverticulosis    . Diverticulosis 11/12/2013  . Essential hypertension    . Frequent PVCs   . Gastroesophageal reflux disease    . Gout    . Hyperlipidemia LDL goal < 100    . Internal hemorrhoids without complication 08/12/2018  . Long-term current use of opiate analgesic 09/07/2016  . Mild carpal tunnel syndrome of right wrist 01/24/2016   Mild degree electrically per EMG 01/24/2016   . Mild carpal tunnel syndrome of right wrist 01/24/2016   Mild degree electrically per EMG 01/24/2016   . Morbid obesity with BMI of 40.0-44.9, adult (HCC)    . Normocytic anemia    . NSVT (nonsustained ventricular  tachycardia) (HCC)   . Obstructive sleep apnea     Moderate, AHI 29.8 per hour with moderately loud snoring and oxygen desaturation to a nadir of 79%. CPAP titration resulted in a prescription for 17 CWP.    [name]. Open-angle glaucoma    . Osteoarthritis cervical spine    . Osteoarthritis of left knee 06/19/2013   Tricompartmental disease.  Treated with double hinged upright knee brace, steroid/xylocaine knee injections, and NSAIDs   . Osteoporosis 05/14/2017   s/p fracture of the right humerus from a fall at ground hight  . Persistent atrial fibrillation (HCC)   . Right rotator cuff tear     Large full-thickness tear of the supraspinatus with mild retraction but no atrophy   . Right rotator cuff tear 04/25/2013   Large full-thickness tear of the supraspinatus with mild retraction but no atrophy    . Secondary male hypogonadism 02/07/2017   Likely secondary to chronic opioid use  . Secondary male hypogonadism 02/07/2017   Likely secondary to chronic opioid use  . Subclinical hypothyroidism    . Tubular adenoma of colon 11/22/2017   Specifics unknown.  Repeat colonoscopy 08/12/2018 with six 3-6 mm tubular adenomas removed endoscopically.  . 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 (HCC)    . Vasomotor rhinitis 04/25/2013    Review of Systems: Review of Systems  Constitutional: Negative for chills,  fever and malaise/fatigue.  Eyes: Negative for blurred vision.  Respiratory: Negative for shortness of breath.   Cardiovascular: Positive for chest pain. Negative for palpitations and leg swelling.  Gastrointestinal: Negative for constipation, diarrhea, nausea and vomiting.  Genitourinary: Negative for dysuria.  Musculoskeletal: Positive for back pain and joint pain. Negative for falls.  Skin: Negative for itching and rash.  Neurological: Positive for tingling and sensory change. Negative for dizziness, focal weakness and headaches.  All other systems reviewed and are  negative.    Physical Exam: Vitals:   08/30/20 1331  BP: 110/69  Pulse: 62  Temp: 98 F (36.7 C)  TempSrc: Oral  SpO2: 95%  Weight: (!) 300 lb 3.2 oz (136.2 kg)  Height: 6' (1.829 m)    Gen: Well-developed, well nourished, NAD HEENT: NCAT head, hearing intact CV: RRR, S1, S2 normal Pulm: CTAB, No rales, no wheezes Extm: ROM intact, Peripheral pulses intact, No peripheral edema Skin: Dry, Warm, normal turgor, no wounds, no rashes Neurologic exam: Mental status: A&Ox3 Cranial Nerves:             II: PERRL             III, IV, VI: Extra-occular motions intact bilaterally             V, VII: Face symmetric, sensation intact in all 3 divisions               VIII: hearing normal to rubbing fingers bilaterally               IX, X: palate rises symmetrically             XI: Head turn and shoulder shrug normal bilaterally               XII: tongue midline    Motor: Strength 4/5 on LUE, 5/5 on RUE, , bulk muscle and tone are normal  Deep Tendon Reflexes: 2+ symmetric  Gait: Atalgic gait Sensory: Light touch diminished on R finger tips, otherwise normal. Coordination: There is no dysmetria on finger-to-nose. Rapid alternating movement test normal. Heel to shin test normal. Psychiatric: Normal mood and affect   Assessment & Plan:   AKI (acute kidney injury) ([place]) Lab Results  Component Value Date   CREATININE 1.96 (H) 08/28/2020   On ED visit yesterday noted to have elevated creatinine at 1.96 from baselin of~1.2/1.3 Felt to be due to dehydration by ED provider. Treated with LR bolus. Also advised to stop losartan in ED. Denies significant change in urination. Mentions feeling weak after starting use of CPAP. Possibly from insensible losses with CPAP use. Alternatively could be due to worsening diabetic nephropathy. Will re-check today to assess for resolution as well as further work-up with UA  - BMP today - UA  Type II diabetes mellitus with neuropathy causing erectile  dysfunction (HCC) Last hgb a1c checked early October at goal at 7.2. Denies any polyuria, polydipsia, polyphagia  - Hgb a1c today - C/w metformin  Peripheral neuropathy Juan Calderon is a 64 yo M w/ PMH of T2DM, PAF on eliquis, DJD, chronic pain on oxycodone, COPD, CAD, HTN, and OSA presenting with peripheral numbness and tingling. He was in his usual state of health until couple weeks ago when he developed intermittent numbness in his hands. He mentions onset of symptoms coincide with starting use of his CPAP machine. Denies similar symptoms int he past. He mentions this sensation is intermittent and has no obvious exacerbating factors. About a week ago symptoms  worsened with associated chest pain, light-headedness and numbness and tingling in his legs that radiates down. He went to ED for evaluation and was given fluids with discharged with recommendation to f/u with PCP. He mentions that at the moment his symptoms are mostly gone but not completely disappeared.  A/P Present with gradual onset peripheral numbness / tingling. Possible differential includes diabetic neuropathy, vitamin deficiency, medication side effects although no obvious cause on current med list. Diabetes has been well controlled. Folate wnl. Thiamine level pending from ED visit. Does take metformin which is rarely associated with B12 deficiency. Will also get further work-up to rule out thyroid dysfunction.  - TSH, B12 - F/u thiamine level - Advised to monitor symptoms and report if worsening    Patient discussed with Dr. Criselda Peaches  -Judeth Cornfield, PGY3 [facility] Internal Medicine Pager: [phone]

## 2020-08-31 LAB — BMP8+ANION GAP
Anion Gap: 18 mmol/L (ref 10.0–18.0)
BUN/Creatinine Ratio: 13 (ref 10–24)
BUN: 23 mg/dL (ref 8–27)
CO2: 21 mmol/L (ref 20–29)
Calcium: 7 mg/dL — ABNORMAL LOW (ref 8.6–10.2)
Chloride: 104 mmol/L (ref 96–106)
Creatinine, Ser: 1.73 mg/dL — ABNORMAL HIGH (ref 0.76–1.27)
GFR calc Af Amer: 47 mL/min/{1.73_m2} — ABNORMAL LOW (ref >59)
GFR calc non Af Amer: 40 mL/min/{1.73_m2} — ABNORMAL LOW (ref >59)
Glucose: 101 mg/dL — ABNORMAL HIGH (ref 65–99)
Potassium: 3.9 mmol/L (ref 3.5–5.2)
Sodium: 143 mmol/L (ref 134–144)

## 2020-08-31 LAB — URINALYSIS, ROUTINE W REFLEX MICROSCOPIC
Bilirubin, UA: NEGATIVE
Glucose, UA: NEGATIVE
Ketones, UA: NEGATIVE
Leukocytes,UA: NEGATIVE
Nitrite, UA: NEGATIVE
Protein,UA: NEGATIVE
RBC, UA: NEGATIVE
Specific Gravity, UA: 1.019 (ref 1.005–1.030)
Urobilinogen, Ur: 0.2 mg/dL (ref 0.2–1.0)
pH, UA: 6 (ref 5.0–7.5)

## 2020-08-31 LAB — VITAMIN B12: Vitamin B-12: 213 pg/mL — ABNORMAL LOW (ref 232–1245)

## 2020-08-31 LAB — TSH: TSH: 2.32 u[IU]/mL (ref 0.450–4.500)

## 2020-08-31 NOTE — Progress Notes (Signed)
Internal Medicine Clinic Attending  Case discussed with Dr. Lee  At the time of the visit.  We reviewed the resident's history and exam and pertinent patient test results.  I agree with the assessment, diagnosis, and plan of care documented in the resident's note.    

## 2020-08-31 NOTE — Addendum Note (Signed)
Addended by: Theotis Barrio on: 08/31/2020 09:25 AM   Modules accepted: Orders

## 2020-09-01 LAB — HEPATIC FUNCTION PANEL
ALT: 11 IU/L (ref 0–44)
AST: 16 IU/L (ref 0–40)
Albumin: 4.1 g/dL (ref 3.8–4.8)
Alkaline Phosphatase: 70 IU/L (ref 44–121)
Bilirubin Total: 0.5 mg/dL (ref 0.0–1.2)
Bilirubin, Direct: 0.2 mg/dL (ref 0.00–0.40)
Total Protein: 6.6 g/dL (ref 6.0–8.5)

## 2020-09-01 LAB — VITAMIN B1: Vitamin B1 (Thiamine): 124 nmol/L (ref 66.5–200.0)

## 2020-09-01 LAB — SPECIMEN STATUS REPORT

## 2020-09-01 MED ORDER — CALCIUM CITRATE-VITAMIN D 315-200 MG-UNIT PO TABS
2.0000 | ORAL_TABLET | Freq: Two times a day (BID) | ORAL | 3 refills | Status: DC
Start: 2020-09-01 — End: 2020-11-22

## 2020-09-01 MED ORDER — VITAMIN B-12 1000 MCG PO TABS
1000.0000 ug | ORAL_TABLET | Freq: Every day | ORAL | 1 refills | Status: DC
Start: 2020-09-01 — End: 2020-12-23

## 2020-09-01 NOTE — Addendum Note (Signed)
Addended by: Theotis Barrio on: 09/01/2020 11:41 AM   Modules accepted: Orders

## 2020-09-06 ENCOUNTER — Encounter: Payer: Medicare Other | Admitting: Student

## 2020-09-08 ENCOUNTER — Telehealth: Payer: Self-pay

## 2020-09-08 NOTE — Telephone Encounter (Signed)
RTC to patient, states his protonix is not working and he wants to try a different medication.  He c/o heartburn, fullness, and bloating right after eating.  Also states heartburn is worse when he is laying down.  Recommended appt with MD for evaluation, he is in agreement and appt was made for 09/15/19 @ 2:15 / yellow team/ Dr. Mathews Argyle, RN,BSN

## 2020-09-08 NOTE — Telephone Encounter (Signed)
Pt states  pantoprazole (PROTONIX) 40 MG tablet is not working. Please call pt back.

## 2020-09-11 DIAGNOSIS — G4733 Obstructive sleep apnea (adult) (pediatric): Secondary | ICD-10-CM | POA: Diagnosis not present

## 2020-09-14 ENCOUNTER — Other Ambulatory Visit: Payer: Self-pay

## 2020-09-14 ENCOUNTER — Encounter: Payer: Self-pay | Admitting: Acute Care

## 2020-09-14 ENCOUNTER — Encounter: Payer: Medicare Other | Admitting: Internal Medicine

## 2020-09-14 ENCOUNTER — Ambulatory Visit (INDEPENDENT_AMBULATORY_CARE_PROVIDER_SITE_OTHER): Payer: Medicare Other | Admitting: Acute Care

## 2020-09-14 VITALS — BP 130/78 | HR 59 | Temp 98.2°F | Ht 72.0 in | Wt 292.0 lb

## 2020-09-14 DIAGNOSIS — I159 Secondary hypertension, unspecified: Secondary | ICD-10-CM

## 2020-09-14 DIAGNOSIS — G4733 Obstructive sleep apnea (adult) (pediatric): Secondary | ICD-10-CM

## 2020-09-14 DIAGNOSIS — Z9989 Dependence on other enabling machines and devices: Secondary | ICD-10-CM | POA: Diagnosis not present

## 2020-09-14 NOTE — Progress Notes (Signed)
History of Present Illness Juan Calderon is a 65 y.o. male remote sometime  smoker with OSA ( PSG in 2014 (AHI 59--> 13 on PAP during titration study), but he has not ever been able to use CPAP.  He attributes this to sleeping face down. He also was fitted for a mouth piece, but never picked it up. He is followed by Dr. Ander Calderon. Past Medical History:  Diagnosis Date  . Alcohol abuse    . Bradycardia   . C6 radiculopathy 01/24/2016   Right upper extremity, mild to moderate electrically by EMG on 01/24/2016  . Cataract    Left eye  . Chronic diastolic heart failure ([place])     with mild left ventricular hypertrophy on Echo 02/2010  . Chronic obstructive pulmonary disease ([place])    . Chronic osteomyelitis of femur ([place]) 04/06/2016  . Chronic osteomyelitis of left femur ([place]) 11/22/2017   Left femur s/p prior trauma  . Chronic osteomyelitis of left femur ([place]) 11/22/2017   Brodie's abscess: left femur s/p prior trauma.  Underwent partial excision and curettage of left femoral osteomyelitis at [facility] 12/30/2017 with grossly purulent material encountered within the medullary canal of the left distal femur.  Cultures grew MSSA.  Post-operatively received 6 weeks of IV antibiotics through 02/10/2018.  CRP elevated at 60.3 at end of IV antibiotic course so continued on Keflex  . Chronic pain syndrome     Left arm and leg s/p traumatic injury   . Chronic renal insufficiency    . Coronary artery disease     25% LAD stenosis on cath 2007.  Stable angina.  . Diverticulosis    . Diverticulosis 11/12/2013  . Essential hypertension    . Frequent PVCs   . Gastroesophageal reflux disease    . Gout    . Hyperlipidemia LDL goal < 100    . Internal hemorrhoids without complication 16/06/9603  . Long-term current use of opiate analgesic 09/07/2016  . Mild carpal tunnel syndrome of right wrist 01/24/2016   Mild degree electrically per EMG 01/24/2016   . Mild carpal tunnel syndrome of right wrist 01/24/2016   Mild degree  electrically per EMG 01/24/2016   . Morbid obesity with BMI of 40.0-44.9, adult ([place])    . Normocytic anemia    . NSVT (nonsustained ventricular tachycardia) ([place])   . Obstructive sleep apnea     Moderate, AHI 29.8 per hour with moderately loud snoring and oxygen desaturation to a nadir of 79%. CPAP titration resulted in a prescription for 17 CWP.    [name] Open-angle glaucoma    . Osteoarthritis cervical spine    . Osteoarthritis of left knee 06/19/2013   Tricompartmental disease.  Treated with double hinged upright knee brace, steroid/xylocaine knee injections, and NSAIDs   . Osteoporosis 05/14/2017   s/p fracture of the right humerus from a fall at ground hight  . Persistent atrial fibrillation ([place])   . Right rotator cuff tear     Large full-thickness tear of the supraspinatus with mild retraction but no atrophy   . Right rotator cuff tear 04/25/2013   Large full-thickness tear of the supraspinatus with mild retraction but no atrophy    . Secondary male hypogonadism 02/07/2017   Likely secondary to chronic opioid use  . Secondary male hypogonadism 02/07/2017   Likely secondary to chronic opioid use  . Subclinical hypothyroidism    . Tubular adenoma of colon 11/22/2017   Specifics unknown.  Repeat colonoscopy 08/12/2018 with six 3-6 mm tubular  adenomas removed endoscopically.  . 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 ([place])    . Vasomotor rhinitis 04/25/2013    Synopsis OV 02/04/20: Juan Calderon is a 65 y/o gentleman who presents for evaluation of chronic shortness of breath.  This has been progressively worsening over several years.  He has no shortness of breath at rest.  He has previously been evaluated by cardiology but was felt that his dyspnea is not fully explained by cardiac disease.  He has been on Spiriva for about 6 months and has used albuterol as needed for years without noticeable improvement.  Nothing improves his symptoms, but his symptoms are worse with  walking, bending over, and around strong chemical smells.  He has occasional wheezing, cough, but no sputum production.  He has seasonal allergies with eye watering, but no sinus symptoms.  He has a former infrequent smoker-pipes, cigars, occasionally cigarettes, but he quit many years ago.  He has a family history of asthma in his mother.  He was switched from Spiriva to Advair after his PFTs did not show obstruction.  Since his last visit he has had no change in symptoms-he remains short of breath.  No cough.  He had an ED visit on 6/22 for shortness of breath, when his diuretics were increased from 3 days/week to daily.  He has not noticed improvement in his breathing since that time.  His primary care physician is concerned about his untreated sleep apnea contributing to his shortness of breath.  He was diagnosed by PSG in 2014 (AHI 59--> 13 on PAP during titration study), but he has not ever been able to use CPAP.  He attributes this to sleeping face down.  He has a mouth and nose breather and does not know that he would benefit from nasal pillows.  He thinks he has gained weight since his study in 2014.  He was fitted for an oral appliance to treat sleep apnea since his study was done, but never picked it up.  He was seen by Dr. Ander Calderon 04/2020. He was scheduled for a Home Sleep Study and PFT's.   Pt. Presents for follow up of his OSA, with down Load. He has compliance of 83%, his AHI is 0.8. Excellent benefit of therapy. He is managing well with therapy. We discussed that he has adequate equipment, and is not having any issues with his device. I assured him that using CPAP is controlling his OSA, and will directly benefit his heart and brain health.  Patient does complain of headache some mornings upon awakening. He also has various complaints of paraesthesias and dizziness. He was recently in the ED on   12/26 with complaints of dizziness and parasthesias. At the time the patient had just been started  on new blood pressure medicine losartan 50 mg, and had just started his CPAP therapy. BP was low, 99/64, and pulse was 55. He was afebrile, with sats of 98% on RA. Labs did reveal creatinine of 1.96. He was  also on metformin. HGB was stable, EKG was reviewed, IVF were given for low BP. He was told to stop Losartan and follow up with his PCP, which he has done. EKG was done and reviewed in the ED.  After follow up labs with PCP  it was determined he had hypocalcemia and Vitamin b12 deficiency. Orders were written for Vitamin b12 and calcium.  He was also asked to stop his Metformin, and Losartin has been stopped.    Down  Load 09/14/2020      09/14/2020  Test Results:                   06/2020 Home Sleep Study>> AHI 63.1>> Severe OSA, Moderate severe oxygen desaturations. Recommended CPAP therapy with auto set 10-20 cm H2O per Dr. Ander Calderon.                                                              PSG in 2014 (AHI 59) CXR, 2 view 03/23/2019-increased lung markings bilaterally centrally and in the bases suspicious for pulmonary edema.  Increased vascular pedicle.  HRCT chest 02/17/2020- bibasilar linear scars, no ILD.  Cardiomegaly with dilated PA.  No pleural abnormalities.  No adenopathy.  Pulmonary Functions Testing Results: PFT Results Latest Ref Rng & Units 12/18/2019 08/07/2013  FVC-Pre L 1.86 2.55  FVC-Predicted Pre % 43 63  FVC-Post L 1.97 2.49  FVC-Predicted Post % 46 61  Pre FEV1/FVC % % 76 69  Post FEV1/FCV % % 75 75  FEV1-Pre L 1.41 1.76  FEV1-Predicted Pre % 43 55  FEV1-Post L 1.48 1.85  DLCO UNC% % 46 58  DLCO COR %Predicted % 100 105  TLC L 5.00 4.83  TLC % Predicted % 67 69  RV % Predicted % 116 115   2021-no significant obstruction or bronchodilator reversibility.  Mild restriction, moderate diffusion impairment.  Severely reduced FEF 25-75 a significant bronchodilator reversibility and high airway resistance suggesting small airways  disease.   Echocardiogram 03/24/2019: LVEF 60%, moderate LVH.  Unable to assess diastolic function due to atrial flutter.  Normal RV size with reduced RV function.  Mild to moderately dilated LA.  Normal valves.   CBC Latest Ref Rng & Units 08/28/2020 02/23/2020 12/07/2019  WBC 4.0 - 10.5 K/uL 8.2 7.3 7.5  Hemoglobin 13.0 - 17.0 g/dL 11.9(L) 12.7(L) 13.5  Hematocrit 39.0 - 52.0 % 37.1(L) 40.2 39.6  Platelets 150 - 400 K/uL 186 207 178    BMP Latest Ref Rng & Units 08/30/2020 08/28/2020 07/25/2020  Glucose 65 - 99 mg/dL 101(H) 127(H) 136(H)  BUN 8 - 27 mg/dL 23 28(H) 23  Creatinine 0.76 - 1.27 mg/dL 1.73(H) 1.96(H) 1.33(H)  BUN/Creat Ratio 10 - 24 13 - 17  Sodium 134 - 144 mmol/L 143 140 143  Potassium 3.5 - 5.2 mmol/L 3.9 3.5 4.7  Chloride 96 - 106 mmol/L 104 106 102  CO2 20 - 29 mmol/L 21 21(L) 27  Calcium 8.6 - 10.2 mg/dL 7.0(L) 7.2(L) 9.6    BNP    Component Value Date/Time   BNP 143.2 (H) 02/23/2020 1251    ProBNP    Component Value Date/Time   PROBNP 1,148.0 (H) 06/18/2014 0534    PFT    Component Value Date/Time   FEV1PRE 1.41 12/18/2019 1233   FEV1POST 1.48 12/18/2019 1233   FVCPRE 1.86 12/18/2019 1233   FVCPOST 1.97 12/18/2019 1233   TLC 5.00 12/18/2019 1233   DLCOUNC 13.26 12/18/2019 1233   PREFEV1FVCRT 76 12/18/2019 1233   PSTFEV1FVCRT 75 12/18/2019 1233    DG Chest 2 View  Result Date: 08/28/2020 CLINICAL DATA:  Shortness of breath, left-sided chest pain EXAM: CHEST - 2 VIEW COMPARISON:  02/23/2020 FINDINGS: Cardiac shadow is enlarged. Aortic calcifications are noted. Lungs are well aerated bilaterally. No  focal infiltrate or sizable effusion is seen. Previously seen vascular congestion has resolved. No bony abnormality is seen. IMPRESSION: No acute abnormality noted. Electronically Signed   By: Inez Catalina M.D.   On: 08/28/2020 10:18   CT Head Wo Contrast  Result Date: 08/28/2020 CLINICAL DATA:  Numbness and tingles.  Paresthesias EXAM: CT HEAD  WITHOUT CONTRAST TECHNIQUE: Contiguous axial images were obtained from the base of the skull through the vertex without intravenous contrast. COMPARISON:  None. FINDINGS: Brain: No acute intracranial hemorrhage. No focal mass lesion. No CT evidence of acute infarction. No midline shift or mass effect. No hydrocephalus. Basilar cisterns are patent. Vascular: No hyperdense vessel or unexpected calcification. Skull: Normal. Negative for fracture or focal lesion. Sinuses/Orbits: Paranasal sinuses and mastoid air cells are clear. Orbits are clear. Other: None. IMPRESSION: No acute intracranial findings. Electronically Signed   By: Suzy Bouchard M.D.   On: 08/28/2020 16:58     Past medical hx Past Medical History:  Diagnosis Date  . Alcohol abuse    . Bradycardia   . C6 radiculopathy 01/24/2016   Right upper extremity, mild to moderate electrically by EMG on 01/24/2016  . Cataract    Left eye  . Chronic diastolic heart failure ([place])     with mild left ventricular hypertrophy on Echo 02/2010  . Chronic obstructive pulmonary disease ([place])    . Chronic osteomyelitis of femur ([place]) 04/06/2016  . Chronic osteomyelitis of left femur ([place]) 11/22/2017   Left femur s/p prior trauma  . Chronic osteomyelitis of left femur ([place]) 11/22/2017   Brodie's abscess: left femur s/p prior trauma.  Underwent partial excision and curettage of left femoral osteomyelitis at [facility] 12/30/2017 with grossly purulent material encountered within the medullary canal of the left distal femur.  Cultures grew MSSA.  Post-operatively received 6 weeks of IV antibiotics through 02/10/2018.  CRP elevated at 60.3 at end of IV antibiotic course so continued on Keflex  . Chronic pain syndrome     Left arm and leg s/p traumatic injury   . Chronic renal insufficiency    . Coronary artery disease     25% LAD stenosis on cath 2007.  Stable angina.  . Diverticulosis    . Diverticulosis 11/12/2013  . Essential hypertension    . Frequent PVCs   .  Gastroesophageal reflux disease    . Gout    . Hyperlipidemia LDL goal < 100    . Internal hemorrhoids without complication 42/70/6237  . Long-term current use of opiate analgesic 09/07/2016  . Mild carpal tunnel syndrome of right wrist 01/24/2016   Mild degree electrically per EMG 01/24/2016   . Mild carpal tunnel syndrome of right wrist 01/24/2016   Mild degree electrically per EMG 01/24/2016   . Morbid obesity with BMI of 40.0-44.9, adult ([place])    . Normocytic anemia    . NSVT (nonsustained ventricular tachycardia) ([place])   . Obstructive sleep apnea     Moderate, AHI 29.8 per hour with moderately loud snoring and oxygen desaturation to a nadir of 79%. CPAP titration resulted in a prescription for 17 CWP.    [name] Open-angle glaucoma    . Osteoarthritis cervical spine    . Osteoarthritis of left knee 06/19/2013   Tricompartmental disease.  Treated with double hinged upright knee brace, steroid/xylocaine knee injections, and NSAIDs   . Osteoporosis 05/14/2017   s/p fracture of the right humerus from a fall at ground hight  . Persistent atrial fibrillation ([place])   . Right  rotator cuff tear     Large full-thickness tear of the supraspinatus with mild retraction but no atrophy   . Right rotator cuff tear 04/25/2013   Large full-thickness tear of the supraspinatus with mild retraction but no atrophy    . Secondary male hypogonadism 02/07/2017   Likely secondary to chronic opioid use  . Secondary male hypogonadism 02/07/2017   Likely secondary to chronic opioid use  . Subclinical hypothyroidism    . Tubular adenoma of colon 11/22/2017   Specifics unknown.  Repeat colonoscopy 08/12/2018 with six 3-6 mm tubular adenomas removed endoscopically.  . 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 ([place])    . Vasomotor rhinitis 04/25/2013     Social History   Tobacco Use  . Smoking status: Never Smoker  . Smokeless tobacco: Never Used  Vaping Use  . Vaping Use: Never used   Substance Use Topics  . Alcohol use: Yes    Alcohol/week: 14.0 standard drinks    Types: 14 Cans of beer per week  . Drug use: No    [place] reports that he has never smoked. He has never used smokeless tobacco. He reports current alcohol use of about 14.0 standard drinks of alcohol per week. He reports that he does not use drugs.  Tobacco Cessation: Never smoker  Past surgical hx, Family hx, Social hx all reviewed.  Current Outpatient Medications on File Prior to Visit  Medication Sig  . albuterol (VENTOLIN HFA) 108 (90 Base) MCG/ACT inhaler Inhale 1-2 puffs into the lungs every 6 (six) hours as needed for shortness of breath.  . allopurinol (ZYLOPRIM) 300 MG tablet TAKE 1 TABLET (300 MG TOTAL) BY MOUTH DAILY. PLEASE NOTE THE CHANGE IN THE NUMBER OF TABLETS TO TAKE DAILY.  [name] atorvastatin (LIPITOR) 20 MG tablet TAKE 1 TABLET BY MOUTH EVERY DAY  . Blood Glucose Monitoring Suppl (ONETOUCH VERIO) w/Device KIT 1 each by Does not apply route daily.  . calcium citrate-vitamin D (CITRACAL+D) 315-200 MG-UNIT tablet Take 2 tablets by mouth 2 (two) times daily.  [name] ELIQUIS 5 MG TABS tablet TAKE 1 TABLET BY MOUTH TWICE A DAY  . flecainide (TAMBOCOR) 100 MG tablet TAKE 1 TABLET BY MOUTH EVERY 12 HOURS  . glucose blood (ONETOUCH VERIO) test strip Use as instructed  . latanoprost (XALATAN) 0.005 % ophthalmic solution Place 1 drop into both eyes at bedtime.  [name] losartan (COZAAR) 50 MG tablet Take 1 tablet (50 mg total) by mouth daily.  . magnesium oxide (MAG-OX) 400 MG tablet Take 400 mg by mouth daily.   . metFORMIN (GLUCOPHAGE) 1000 MG tablet TAKE 1 TABLET (1,000 MG TOTAL) BY MOUTH 2 (TWO) TIMES DAILY WITH A MEAL.  . metoprolol tartrate (LOPRESSOR) 25 MG tablet TAKE 1 TABLET BY MOUTH TWICE A DAY  . nitroGLYCERIN (NITROSTAT) 0.4 MG SL tablet Place 1 tablet (0.4 mg total) under the tongue every 5 (five) minutes as needed for chest pain.  [name] Olopatadine HCl 0.2 % SOLN Place 1 drop into both eyes  daily.  Glory Rosebush DELICA LANCETS 21H MISC Use 1 strip daily  . pantoprazole (PROTONIX) 40 MG tablet Take 1 tablet (40 mg total) by mouth 2 (two) times daily.  . Sorbitol 70 % SOLN Take 15-60 mLs by mouth daily as needed. (Patient taking differently: Take 15-60 mLs by mouth daily as needed (for constipation).)  . terazosin (HYTRIN) 5 MG capsule TAKE 1 CAPSULE (5 MG TOTAL) BY MOUTH AT BEDTIME.  [name] torsemide (DEMADEX) 20 MG tablet Take  1 tablet (20 mg total) by mouth daily.  . vitamin B-12 (CYANOCOBALAMIN) 1000 MCG tablet Take 1 tablet (1,000 mcg total) by mouth daily.   No current facility-administered medications on file prior to visit.     Allergies  Allergen Reactions  . Ramipril Swelling    Facial swelling  . Tape Other (See Comments)    Irritates skin/ pls use paper tape  . Testosterone Rash    Review Of Systems:  Constitutional:   No  weight loss, night sweats,  Fevers, chills, + fatigue, or  lassitude.  HEENT:   Occasional  headaches, No  Difficulty swallowing,  Tooth/dental problems, or  Sore throat,                No sneezing, itching, ear ache, nasal congestion, post nasal drip,   CV:  No chest pain at present, has had some in the past which has been worked up, EKG done,  No Orthopnea, PND, No swelling in lower extremities, anasarca, dizziness, palpitations, syncope.   GI  No heartburn, indigestion, abdominal pain, nausea, vomiting, diarrhea, change in bowel habits, loss of appetite, bloody stools.   Resp: Occasional  shortness of breath with exertion none  at rest.  No excess mucus, no productive cough,  No non-productive cough,  No coughing up of blood.  No change in color of mucus.  No wheezing.  No chest wall deformity  Skin: no rash or lesions.  GU: no dysuria, change in color of urine, no urgency or frequency.  No flank pain, no hematuria   MS:  No joint pain or swelling.  No decreased range of motion.  No back pain.  Psych:  No change in mood or affect. No  depression or anxiety.  No memory loss.   Vital Signs BP 130/78 (BP Location: Left Arm, Cuff Size: Large)   Pulse (!) 59   Temp 98.2 F (36.8 C) (Other (Comment)) Comment (Src): wrist  Ht 6' (1.829 m)   Wt 292 lb (132.5 kg)   SpO2 99%   BMI 39.60 kg/m    Physical Exam:  General- No distress,  A&Ox3, pleasant ENT: No sinus tenderness, TM clear, pale nasal mucosa, no oral exudate,no post nasal drip, no LAN Cardiac: S1, S2, regular rate and rhythm, no murmur Chest: No wheeze/ rales/ dullness; no accessory muscle use, no nasal flaring, no sternal retractions Abd.: Soft Non-tender, ND, BS +,Body mass index is 39.6 kg/m. Ext: No clubbing cyanosis, edema Neuro:  normal strength, MAE x 4, A&O x 3 Skin: No rashes, warm and dry, no lesions Psych: normal mood and behavior   Assessment/Plan OSA with AHI of AHI 63.1>> Severe OSA, Moderate severe oxygen desaturations.  CPAP recommended 10-20 cm Pressure Consider Inspire Device if unable to tolerate CPAP therapy Plan Continue on CPAP at bedtime. You appear to be benefiting from the treatment  Goal is to wear for at least 6 hours each night for maximal clinical benefit. Continue to work on weight loss, as the link between excess weight  and sleep apnea is well established.   Remember to establish a good bedtime routine, and work on sleep hygiene.  Limit daytime naps , avoid stimulants such as caffeine and nicotine close to bedtime, exercise daily to promote sleep quality, avoid heavy , spicy, fried , or rich foods before bed. Ensure adequate exposure to natural light during the day,establish a relaxing bedtime routine with a pleasant sleep environment ( Bedroom between 60 and 67 degrees, turn off bright lights ,  TV or device screens screens , consider black out curtains or white noise machines) Do not drive if sleepy. Remember to clean mask, tubing, filter, and reservoir once weekly with soapy water.  Follow up with 3 months with Dr. Ander Calderon    or before as needed.  Please try Biotene mouth rinse to see if this helps with your mouth dryness. This ia available at the drug store or on [employer]. Also try saline mist for your nasal dryness.       Magdalen Spatz, NP 09/14/2020  4:40 PM

## 2020-09-14 NOTE — Patient Instructions (Signed)
It is good to see you today. Continue on CPAP at bedtime. You appear to be benefiting from the treatment  Goal is to wear for at least 6 hours each night for maximal clinical benefit. Continue to work on weight loss, as the link between excess weight  and sleep apnea is well established.   Remember to establish a good bedtime routine, and work on sleep hygiene.  Limit daytime naps , avoid stimulants such as caffeine and nicotine close to bedtime, exercise daily to promote sleep quality, avoid heavy , spicy, fried , or rich foods before bed. Ensure adequate exposure to natural light during the day,establish a relaxing bedtime routine with a pleasant sleep environment ( Bedroom between 60 and 67 degrees, turn off bright lights , TV or device screens screens , consider black out curtains or white noise machines) Do not drive if sleepy. Remember to clean mask, tubing, filter, and reservoir once weekly with soapy water.  Follow up with 3 months with Dr. Ander Slade   or before as needed.  Please try Biotene mouth rinse to see if this helps with your mouth dryness. This ia available at the drug store or on [employer]. Also try saline mist for your nasal dryness.

## 2020-09-16 DIAGNOSIS — G4733 Obstructive sleep apnea (adult) (pediatric): Secondary | ICD-10-CM | POA: Diagnosis not present

## 2020-09-21 DIAGNOSIS — G4733 Obstructive sleep apnea (adult) (pediatric): Secondary | ICD-10-CM | POA: Diagnosis not present

## 2020-10-12 DIAGNOSIS — G4733 Obstructive sleep apnea (adult) (pediatric): Secondary | ICD-10-CM | POA: Diagnosis not present

## 2020-10-24 ENCOUNTER — Ambulatory Visit (INDEPENDENT_AMBULATORY_CARE_PROVIDER_SITE_OTHER): Payer: Medicare Other | Admitting: Student in an Organized Health Care Education/Training Program

## 2020-10-24 ENCOUNTER — Encounter: Payer: Self-pay | Admitting: Student in an Organized Health Care Education/Training Program

## 2020-10-24 VITALS — BP 152/77 | HR 51 | Temp 97.9°F | Ht 72.0 in | Wt 299.5 lb

## 2020-10-24 DIAGNOSIS — I480 Paroxysmal atrial fibrillation: Secondary | ICD-10-CM | POA: Diagnosis not present

## 2020-10-24 DIAGNOSIS — S81811A Laceration without foreign body, right lower leg, initial encounter: Secondary | ICD-10-CM | POA: Insufficient documentation

## 2020-10-24 DIAGNOSIS — E538 Deficiency of other specified B group vitamins: Secondary | ICD-10-CM | POA: Insufficient documentation

## 2020-10-24 DIAGNOSIS — Z79891 Long term (current) use of opiate analgesic: Secondary | ICD-10-CM

## 2020-10-24 DIAGNOSIS — E1140 Type 2 diabetes mellitus with diabetic neuropathy, unspecified: Secondary | ICD-10-CM

## 2020-10-24 DIAGNOSIS — N521 Erectile dysfunction due to diseases classified elsewhere: Secondary | ICD-10-CM

## 2020-10-24 MED ORDER — OXYCODONE-ACETAMINOPHEN 10-325 MG PO TABS: 1.0000 | ORAL_TABLET | Freq: Three times a day (TID) | ORAL | 0 refills | Status: DC | PRN

## 2020-10-24 NOTE — Progress Notes (Signed)
   Assessment and Plan:  See Encounters tab for problem-based medical decision making.   __________________________________________________________  HPI:   65 year old person osteoarthritis, atrial fibrillation, diabetes, and hypertension here for follow-up of his chronic medical conditions.  Reports good adherence with his medications without side effects.  He had 1 visit to the emergency department in December for some dizziness, found to have a mild acute kidney injury but was able to be treated as an out patient.  On follow-up visit he was found to have a persistent hypocalcemia and vitamin B12 deficiency.  Started on supplementation.  Reports his pain generators are stable.  Still has pain in both of his knees related to osteoarthritis as well as hip and low back.  Neuropathy is improved.  No recent falls.  Did complain of a skin tear on his right shin about a week ago.  Came from scratching and sitting outside in the sun.  Says it might have been a blister.  Denies any other bug bites or local trauma.  __________________________________________________________  Problem List: Patient Active Problem List   Diagnosis Date Noted  . Chronic use of opiate drug for therapeutic purpose 08/23/2017    Priority: High  . Paroxysmal atrial fibrillation (HCC) 10/25/2015    Priority: High  . 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 ([place]) 04/25/2013    Priority: High  . Severe obesity with body mass index (BMI) of 35.0 to 39.9 with comorbidity ([place]) 04/25/2013    Priority: High  . Alcohol use disorder 02/28/2015    Priority: Medium  . Obstructive sleep apnea 06/01/2013    Priority: Medium  . Osteoarthritis cervical spine 04/25/2013    Priority: Medium  . Hyperlipidemia 04/25/2013    Priority: Medium  . Coronary artery disease involving native coronary artery with angina pectoris ([place]) 04/25/2013    Priority: Medium  . COPD (chronic obstructive pulmonary disease)  ([place]) 04/25/2013    Priority: Medium  . Chronic diastolic heart failure ([place]) 02/04/2012    Priority: Medium  . Essential hypertension 09/20/2011    Priority: Medium  . Tubular adenoma of colon 11/22/2017    Priority: Low  . C6 radiculopathy 01/24/2016    Priority: Low  . Constipation due to opioid therapy 12/24/2014    Priority: Low  . Post-traumatic osteoarthritis of left knee 06/19/2013    Priority: Low  . Gastroesophageal reflux disease without esophagitis 04/25/2013    Priority: Low  . Open-angle glaucoma 04/25/2013    Priority: Low  . Idiopathic chronic gout without tophus 04/25/2013    Priority: Low  . Healthcare maintenance 01/15/2013    Priority: Low  . Noninfected skin tear of right leg 10/24/2020    Medications: Reconciled today in Epic __________________________________________________________  Physical Exam:  Vital Signs: Vitals:   10/24/20 1055  BP: (!) 152/77  Pulse: (!) 51  Temp: 97.9 F (36.6 C)  TempSrc: Oral  SpO2: 97%  Weight: 299 lb 8 oz (135.9 kg)  Height: 6' (1.829 m)    Gen: Well appearing, NAD Neck: No cervical LAD, No thyromegaly or nodules CV: RRR, no murmurs Pulm: Normal effort, CTA throughout, no wheezing.  Skin: Right shin has a 2 x 2 centimeter erosion, appears consistent with a skin tear.  No surrounding erythema, no purulent drainage, no granulation tissue.

## 2020-10-24 NOTE — Assessment & Plan Note (Signed)
He has a 2 x 2 centimeter simple skin tear on his anterior lower leg, likely related to local trauma or may be scratching.  No need for antibiotics.  I talked to him to minutes about wound care, avoid peroxide to clean bandage to promote healing.

## 2020-10-24 NOTE — Assessment & Plan Note (Signed)
Hemoglobin A1c 6.8% 2 months ago.  Seems to be under good control.  Planning to continue with Metformin 1000 mg twice daily.

## 2020-10-24 NOTE — Assessment & Plan Note (Signed)
Vitamin B12 deficient with a level of under 250.  He has been on oral supplementation with B12, will recheck a B12 level today.  His symptoms of the neuropathy are improving.  If B12 is not improving with oral supplementation we can work on arranging for IM supplementation.

## 2020-10-24 NOTE — Assessment & Plan Note (Addendum)
Patient found to be hypocalcemic to 7.0, normal albumin during ED visit in December.  This has been a problem dating back to at least 2017 usually associated with hypomagnesemia.  He has been treated with calcium supplementation, will recheck a calcium today along with magnesium, vitamin D, and parathyroid hormone.  Addendum: labs show the calcium is up to normal with an oral calcium supplement. The PTH is even suppressed. Labs did also show a very low magnesium at 0.8, which has been an intermittent problem for a few years. I asked him to restart magnesium oxide at 400mg  daily, then increase to 800 mg daily if no diarrhea.

## 2020-10-24 NOTE — Assessment & Plan Note (Signed)
Stable, no symptomatic tachyarrhythmias.  Plan to continue with Eliquis 5 mg twice daily for primary prophylaxis of a stroke.  Follows with cardiology and is prescribed flecainide.  Working on sleep apnea as well.

## 2020-10-24 NOTE — Assessment & Plan Note (Signed)
Chronic pain due to musculoskeletal disease and osteoarthritis.  Doing well on current dose of oxycodone, in removes his functional status.  Has had no side effects.  Is on a strong bowel regimen.  Plan to continue with oxycodone 10 mg 3 times daily for his pain.  I refilled a 53-month supply with #100 tablets to last 30 days.  I reviewed the database which showed appropriate dispensing.  Will obtain a tox assure today.

## 2020-10-25 ENCOUNTER — Encounter: Payer: Self-pay | Admitting: *Deleted

## 2020-10-25 DIAGNOSIS — G4733 Obstructive sleep apnea (adult) (pediatric): Secondary | ICD-10-CM | POA: Diagnosis not present

## 2020-10-25 LAB — PTH, INTACT AND CALCIUM
Calcium: 10.5 mg/dL — ABNORMAL HIGH (ref 8.6–10.2)
PTH: 10 pg/mL — ABNORMAL LOW (ref 15–65)

## 2020-10-25 MED ORDER — MAGNESIUM OXIDE 400 MG PO TABS
400.0000 mg | ORAL_TABLET | Freq: Two times a day (BID) | ORAL | 3 refills | Status: DC
Start: 2020-10-25 — End: 2021-11-24

## 2020-10-25 NOTE — Addendum Note (Signed)
Addended by: Lalla Brothers T on: 10/25/2020 03:19 PM   Modules accepted: Orders

## 2020-10-26 LAB — TOXASSURE SELECT,+ANTIDEPR,UR

## 2020-10-27 ENCOUNTER — Telehealth: Payer: Self-pay | Admitting: *Deleted

## 2020-10-27 NOTE — Telephone Encounter (Signed)
Call from Tehaleh, NP from [employer] - stated they see pt once a year for preventive screening. She did a PAD screening which was abnormal, left leg moderation; she wanted to  Report findings  to pt's doctor.

## 2020-10-28 NOTE — Telephone Encounter (Signed)
Ok. Thank you. He is on medical management for PAD.

## 2020-10-31 LAB — BMP8+ANION GAP
Anion Gap: 17 mmol/L (ref 10.0–18.0)
BUN/Creatinine Ratio: 8 — ABNORMAL LOW (ref 10–24)
BUN: 13 mg/dL (ref 8–27)
CO2: 25 mmol/L (ref 20–29)
Calcium: 10.3 mg/dL — ABNORMAL HIGH (ref 8.6–10.2)
Chloride: 101 mmol/L (ref 96–106)
Creatinine, Ser: 1.53 mg/dL — ABNORMAL HIGH (ref 0.76–1.27)
GFR calc Af Amer: 54 mL/min/{1.73_m2} — ABNORMAL LOW (ref >59)
GFR calc non Af Amer: 47 mL/min/{1.73_m2} — ABNORMAL LOW (ref >59)
Glucose: 129 mg/dL — ABNORMAL HIGH (ref 65–99)
Potassium: 4.3 mmol/L (ref 3.5–5.2)
Sodium: 143 mmol/L (ref 134–144)

## 2020-10-31 LAB — VITAMIN D 1,25 DIHYDROXY
Vitamin D 1, 25 (OH)2 Total: 67 pg/mL — ABNORMAL HIGH
Vitamin D2 1, 25 (OH)2: <10 pg/mL
Vitamin D3 1, 25 (OH)2: 65 pg/mL

## 2020-10-31 LAB — VITAMIN B12: Vitamin B-12: >2000 pg/mL — ABNORMAL HIGH (ref 232–1245)

## 2020-10-31 LAB — VITAMIN D 25 HYDROXY (VIT D DEFICIENCY, FRACTURES): Vit D, 25-Hydroxy: 30.8 ng/mL (ref 30.0–100.0)

## 2020-10-31 LAB — MAGNESIUM: Magnesium: 0.8 mg/dL — CL (ref 1.6–2.3)

## 2020-11-02 ENCOUNTER — Encounter: Payer: Self-pay | Admitting: Internal Medicine

## 2020-11-02 ENCOUNTER — Ambulatory Visit (INDEPENDENT_AMBULATORY_CARE_PROVIDER_SITE_OTHER): Payer: Medicare Other | Admitting: Internal Medicine

## 2020-11-02 ENCOUNTER — Other Ambulatory Visit: Payer: Self-pay

## 2020-11-02 VITALS — BP 154/76 | HR 59 | Temp 97.6°F | Ht 72.0 in | Wt 300.8 lb

## 2020-11-02 DIAGNOSIS — S81811D Laceration without foreign body, right lower leg, subsequent encounter: Secondary | ICD-10-CM

## 2020-11-02 MED ORDER — SORBSAN WOUND DRESSING EX PADS: 10.0000 | MEDICATED_PAD | Freq: Every day | CUTANEOUS | 0 refills | Status: AC

## 2020-11-02 NOTE — Assessment & Plan Note (Addendum)
Juan Calderon presents for f/u assessment of traumatic ulcer of his right leg. He was seen by Dr.Vincent on 10/24/20 for anterior lower leg skin tear, thought to be due to local trauma. Was advised on wound care and dressing at that time. He mentions no significant changes until about a week ago when he began to feel swelling and redness around the wound with increased itching. States he wanted to be evaluated in person since he is diabetic and at high risk for complications. Denies any systemic symptoms such as fever, chills, nausea, vomiting.  A/P Healing ulcer of anterior shin. In-office debridement performed.  Sharp Debridement Procedure Note  Pre-operative Diagnosis: Traumatic wound of right leg Locations:R proximal anterior shin  Procedure Details  Patient informed of the risks (including bleeding and infection) and benefits of the  procedure and verbal informed consent obtained.  Area of adherent fibrinous exudate removed via #15 scalpel with no significant exudate or bleeding to reveal clean granulation tissue underneath. Mediplex dressing applied after completion. The patient tolerated the procedure well.  Condition: Stable  Complications: None  Plan: 1. Instructed to keep the wound clean with dressing changes 2. Warning signs of infection were reviewed.

## 2020-11-02 NOTE — Patient Instructions (Addendum)
Dear Juan Calderon,  Thank you for allowing [place] to provide your care today. Today we discussed your leg wound    I have ordered no labs for you. I will call if any are abnormal.    Today we made no changes to your medications:    Please follow-up as needed.    Please call the internal medicine center clinic if you have any questions or concerns, we may be able to help and keep you from a long and expensive emergency room wait. Our clinic and after hours phone number is [phone], the best time to call is Monday through Friday 9 am to 4 pm but there is always someone available 24/7 if you have an emergency. If you need medication refills please notify your pharmacy one week in advance and they will send [place] a request.    If you have not gotten the COVID vaccine, I recommend doing so:  You may get it at your local [facility] or [facility] OR To schedule an appointment for a COVID vaccine or be added to the vaccine wait list: Go to [email or web address]   OR Go to [email or web address]                  OR Call [phone]                                     OR Call [phone] and select Option 2  Thank you for choosing [facility]   How to Change Your Wound Dressing A dressing is a material that is placed in and over a wound. A dressing helps your wound heal by protecting it from:  Germs (bacteria).  Another injury.  Getting too dry or too wet. There are several types of wound dressings. Some examples are:  Bandages.  Gauze pads.  Foam pads.  Antimicrobial dressings. These prevent or treat infection and may contain something that kills germs (an antiseptic), such as silver or iodine.  Calcium alginate. These are general guidelines. Follow your doctor's instructions about how to care for your wound. What are the risks? It is usually safe to change your dressing. The sticky (adhesive) tape that is used with a dressing may make your skin sore or irritated, or it may  cause a rash. These are the most common problems. However, more serious problems can happen, such as:  Bleeding.  Infection. Supplies needed: Set up a clean area for wound care. You will need:  A trash bag that you can throw away. Have it open and ready to use.  Hand sanitizer.  Cleaning solution as told by your doctor. This may include: ? Germ-free (sterile) water. ? Germ-free salt water (saline). ? Wound cleanser. ? New wound dressing material. Make sure to open the dressing package so the dressing stays on the inside of the package. You may also need the following in your clean area:  A box of vinyl gloves.  Tape.  Adhesive remover.  Skin protectant. This may be a wipe, film, or spray.  Clean or germ-free scissors.  A cotton-tipped applicator. How to change your dressing How often you change your dressing will depend on your wound. Change the dressing as often as told by your doctor. Your doctor may change your dressing, or a family member, friend, or caregiver may be shown how to do it. It is important to:  Wash your hands before and after each dressing  change. If you cannot use soap and water, use hand sanitizer.  Wear gloves and protective clothing while changing a dressing. This may include eye protection.  Never let anyone change your dressing if he or she has an infection, a skin problem, or a skin wound or cut of any size. Preparing to change your dressing  Take a shower before you do the first dressing change of the day. Before you shower, ask your doctor if you should put plastic leak-proof sealing wrap over your dressing to protect it.  If needed, take pain medicine 30 minutes before you change your dressing as told by your doctor. Taking off your old dressing  Wash your hands with soap and water. Dry your hands with a clean towel. If you cannot use soap and water, use hand sanitizer.  Go to the clean area that you have set up with all the supplies you will  need.  If you are using gloves, put the gloves on before you take off the dressing.  Gently take off any adhesive or tape by pulling it off in the direction of your hair growth. Only touch the outside edges of the dressing. ? If you are told to use an adhesive remover to loosen the edges of the dressing, make sure to avoid the wound area.  Take off the dressing. If the dressing sticks to your skin, wet the dressing with the cleaner that your doctor says to use. This helps it come off more easily.  Take off any gauze or packing in your wound.  Throw the old dressing supplies into the trash bag.  Take off your gloves. To take off each glove, grab the cuff with your other hand and turn the glove inside out. Put the gloves in the trash right away.  Wash your hands with soap and water. Dry your hands with a clean towel. If you cannot use soap and water, use hand sanitizer.   Cleaning your wound  Follow instructions from your doctor about how to clean your wound. This may include using the cleaner that your doctor recommends. ? You may need to use germ-free water to clean your wound if you are putting on a dressing that has silver in it.  Do not use over-the-counter medicated or antiseptic creams, sprays, liquids, or dressings unless your doctor tells you to do that.  Use a clean gauze pad to clean the area fully with the cleaner that your doctor recommends.  Throw the gauze pad into the trash bag.  Wash your hands with soap and water. Dry your hands with a clean towel. If you cannot use soap and water, use hand sanitizer. Putting on the dressing  If your doctor recommended a skin protectant, put it on the skin around the wound.  Gently pack the wound if told by your doctor.  Cover the wound with the recommended dressing. Make sure to touch only the outside edges of the dressing. Do not touch the inside of the dressing.  Attach the dressing so all sides stay in place. You may do this  with medical adhesive, roll gauze, or tape. If you use tape, do not wrap the tape all the way around your arm or leg.  Take off your gloves. Put them in the trash bag with the old dressing. Tie the bag shut and throw it away.  Wash your hands with soap and water. Dry your hands with a clean towel. If you cannot use soap and water, use hand sanitizer.  Follow these instructions at home: Wound care  Check your wound every day for signs of infection, or as often as told by your doctor. Check for: ? More redness, swelling, or pain. ? More fluid or blood. ? Warmth. ? Pus or a bad smell.   General instructions  Take over-the-counter and prescription medicines only as told by your doctor.  Ask your doctor if the medicine prescribed to you: ? Requires you to avoid driving or using heavy machinery. ? Can cause trouble pooping (constipation). You may need to take steps to prevent or treat trouble pooping:  Drink enough fluid to keep your pee (urine) pale yellow.  Take over-the-counter or prescription medicines.  Eat foods that are high in fiber. These include beans, whole grains, and fresh fruits and vegetables.  Limit foods that are high in fat and sugar. These include fried or sweet foods.  Keep all follow-up visits as told by your doctor. This is important. Contact a doctor if:  You have new pain.  You have irritation, a rash, or itching around the wound or dressing.  It hurts to change your dressing.  Changing your dressing causes a lot of bleeding. Get help right away if:  You have very bad pain.  You have signs of infection, such as: ? More redness, swelling, or pain. ? More fluid or blood. ? Warmth. ? Pus or a bad smell. ? Red streaks leading from the wound. ? A fever. Summary  A dressing is a material that is placed in and over a wound.  A dressing helps your wound heal.  Wash your hands before and after each dressing change.  Follow your doctor's instructions  about how to care for your wound.  Check your wound for signs of infection. This information is not intended to replace advice given to you by your health care provider. Make sure you discuss any questions you have with your health care provider. Document Revised: 04/17/2018 Document Reviewed: 04/17/2018 Elsevier Patient Education  [place].

## 2020-11-02 NOTE — Progress Notes (Signed)
CC: Leg wound  HPI: JuanJuan Calderon is a 65 y.o. with PMH listed below presenting with complaint of leg wound. Please see problem based assessment and plan for further details.  Past Medical History:  Diagnosis Date  . Alcohol abuse    . Bradycardia   . C6 radiculopathy 01/24/2016   Right upper extremity, mild to moderate electrically by EMG on 01/24/2016  . Cataract    Left eye  . Chronic diastolic heart failure ([place])     with mild left ventricular hypertrophy on Echo 02/2010  . Chronic obstructive pulmonary disease ([place])    . Chronic osteomyelitis of femur ([place]) 04/06/2016  . Chronic osteomyelitis of left femur ([place]) 11/22/2017   Left femur s/p prior trauma  . Chronic osteomyelitis of left femur ([place]) 11/22/2017   Brodie's abscess: left femur s/p prior trauma.  Underwent partial excision and curettage of left femoral osteomyelitis at [facility] 12/30/2017 with grossly purulent material encountered within the medullary canal of the left distal femur.  Cultures grew MSSA.  Post-operatively received 6 weeks of IV antibiotics through 02/10/2018.  CRP elevated at 60.3 at end of IV antibiotic course so continued on Keflex  . Chronic pain syndrome     Left arm and leg s/p traumatic injury   . Chronic renal insufficiency    . Coronary artery disease     25% LAD stenosis on cath 2007.  Stable angina.  . Diverticulosis    . Diverticulosis 11/12/2013  . Essential hypertension    . Frequent PVCs   . Gastroesophageal reflux disease    . Gout    . Hyperlipidemia LDL goal < 100    . Internal hemorrhoids without complication 50/27/7412  . Long-term current use of opiate analgesic 09/07/2016  . Mild carpal tunnel syndrome of right wrist 01/24/2016   Mild degree electrically per EMG 01/24/2016   . Mild carpal tunnel syndrome of right wrist 01/24/2016   Mild degree electrically per EMG 01/24/2016   . Morbid obesity with BMI of 40.0-44.9, adult ([place])    . Normocytic anemia    . NSVT (nonsustained ventricular  tachycardia) ([place])   . Obstructive sleep apnea     Moderate, AHI 29.8 per hour with moderately loud snoring and oxygen desaturation to a nadir of 79%. CPAP titration resulted in a prescription for 17 CWP.    [name] Open-angle glaucoma    . Osteoarthritis cervical spine    . Osteoarthritis of left knee 06/19/2013   Tricompartmental disease.  Treated with double hinged upright knee brace, steroid/xylocaine knee injections, and NSAIDs   . Osteoporosis 05/14/2017   s/p fracture of the right humerus from a fall at ground hight  . Persistent atrial fibrillation ([place])   . Right rotator cuff tear     Large full-thickness tear of the supraspinatus with mild retraction but no atrophy   . Right rotator cuff tear 04/25/2013   Large full-thickness tear of the supraspinatus with mild retraction but no atrophy    . Secondary male hypogonadism 02/07/2017   Likely secondary to chronic opioid use  . Secondary male hypogonadism 02/07/2017   Likely secondary to chronic opioid use  . Subclinical hypothyroidism    . Tubular adenoma of colon 11/22/2017   Specifics unknown.  Repeat colonoscopy 08/12/2018 with six 3-6 mm tubular adenomas removed endoscopically.  . 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 ([place])    . Vasomotor rhinitis 04/25/2013    Review of Systems: Review of Systems  Constitutional: Negative  for chills, fever and malaise/fatigue.  Eyes: Negative for blurred vision.  Respiratory: Negative for shortness of breath.   Cardiovascular: Negative for chest pain.  Gastrointestinal: Negative for constipation, diarrhea, nausea and vomiting.  Neurological: Negative for dizziness.  All other systems reviewed and are negative.    Physical Exam: Vitals:   11/02/20 1314  BP: (!) 154/76  Pulse: (!) 59  Temp: 97.6 F (36.4 C)  TempSrc: Oral  SpO2: 92%  Weight: (!) 300 lb 12.8 oz (136.4 kg)  Height: 6' (1.829 m)   Gen: Well-developed, well nourished, NAD HEENT: NCAT head,  hearing intact CV: RRR, S1, S2 normal Pulm: CTAB, No rales, no wheezes Extm: ROM intact, Peripheral pulses intact, trace ankle edema bilaterally Skin: Dry, Warm, normal turgor, 2x2cm healing ulcer on R proximal anterior shin with fibrinous exudate. Pictured below        Assessment & Plan:   Noninfected skin tear of right leg JuanCalderon presents for f/u assessment of traumatic ulcer of his right leg. He was seen by Dr.Vincent on 10/24/20 for anterior lower leg skin tear, thought to be due to local trauma. Was advised on wound care and dressing at that time. He mentions no significant changes until about a week ago when he began to feel swelling and redness around the wound with increased itching. States he wanted to be evaluated in person since he is diabetic and at high risk for complications. Denies any systemic symptoms such as fever, chills, nausea, vomiting.  A/P Healing ulcer of anterior shin. In-office debridement performed.  Sharp Debridement Procedure Note  Pre-operative Diagnosis: Traumatic wound of right leg Locations:R proximal anterior shin  Procedure Details  Patient informed of the risks (including bleeding and infection) and benefits of the  procedure and verbal informed consent obtained.  Area of adherent fibrinous exudate removed via #15 scalpel with no significant exudate or bleeding to reveal clean granulation tissue underneath. Mediplex dressing applied after completion. The patient tolerated the procedure well.  Condition: Stable  Complications: None  Plan: 1. Instructed to keep the wound clean with dressing changes 2. Warning signs of infection were reviewed.      Patient seen with Dr. Jimmye Norman  -Gilberto Better, [facility] Pager: [phone]

## 2020-11-09 DIAGNOSIS — G4733 Obstructive sleep apnea (adult) (pediatric): Secondary | ICD-10-CM | POA: Diagnosis not present

## 2020-11-14 NOTE — Progress Notes (Signed)
Internal Medicine Clinic Attending  I saw and evaluated the patient.  I personally confirmed the key portions of the history and exam documented by Dr. Truman Hayward and I reviewed pertinent patient test results.  Sharp debridement of fibrinous slough was supervised. The assessment, diagnosis, and plan were formulated together and I agree with the documentation in the resident's note.

## 2020-11-21 ENCOUNTER — Other Ambulatory Visit: Payer: Self-pay | Admitting: Internal Medicine

## 2020-11-21 DIAGNOSIS — G609 Hereditary and idiopathic neuropathy, unspecified: Secondary | ICD-10-CM

## 2020-11-24 DIAGNOSIS — G4733 Obstructive sleep apnea (adult) (pediatric): Secondary | ICD-10-CM | POA: Diagnosis not present

## 2020-12-01 ENCOUNTER — Encounter: Payer: Self-pay | Admitting: *Deleted

## 2020-12-01 NOTE — Progress Notes (Signed)

## 2020-12-06 NOTE — Progress Notes (Signed)
Things That May Be Affecting Your Health:  Alcohol  Hearing loss  Pain   x Depression  Home Safety  Sexual Health   Diabetes  Lack of physical activity  Stress   Difficulty with daily activities x Loneliness  Tiredness   Drug use  Medicines  Tobacco use   Falls  Motor Vehicle Safety x Weight   Food choices  Oral Health  Other    YOUR PERSONALIZED HEALTH PLAN : 1. Schedule your next subsequent Medicare Wellness visit in one year 2. Attend all of your regular appointments to address your medical issues 3. Complete the preventative screenings and services   Annual Wellness Visit   Medicare Covered Preventative Screenings and [place] Men and Women Who How Often Need? Date of Last Service Action  Abdominal Aortic Aneurysm Adults with AAA risk factors Once      Alcohol Misuse and Counseling All Adults Screening once a year if no alcohol misuse. Counseling up to 4 face to face sessions.     Bone Density Measurement  Adults at risk for osteoporosis Once every 2 yrs      Lipid Panel Z13.6 All adults without CV disease Once every 5 yrs       Colorectal Cancer   Stool sample or  Colonoscopy All adults 64 and older   Once every year  Every 10 years        Depression All Adults Once a year  Today   Diabetes Screening Blood glucose, post glucose load, or GTT Z13.1  All adults at risk  Pre-diabetics  Once per year  Twice per year      Diabetes  Self-Management Training All adults Diabetics 10 hrs first year; 2 hours subsequent years. Requires Copay     Glaucoma  Diabetics  Family history of glaucoma  African Americans 35 yrs +  Hispanic Americans 53 yrs + Annually - requires coppay      Hepatitis C Z72.89 or F19.20  High Risk for HCV  Born between 1945 and 1965  Annually  Once      HIV Z11.4 All adults based on risk  Annually btw ages 95 & 81 regardless of risk  Annually > 65 yrs if at increased risk      Lung Cancer Screening  Asymptomatic adults aged 61-77 with 30 pack yr history and current smoker OR quit within the last 15 yrs Annually Must have counseling and shared decision making documentation before first screen      Medical Nutrition Therapy Adults with   Diabetes  Renal disease  Kidney transplant within past 3 yrs 3 hours first year; 2 hours subsequent years     Obesity and Counseling All adults Screening once a year Counseling if BMI 30 or higher  Today   Tobacco Use Counseling Adults who use tobacco  Up to 8 visits in one year     Vaccines Z23  Hepatitis B  Influenza   Pneumonia  Adults   Once  Once every flu season  Two different vaccines separated by one year     Next Annual Wellness Visit People with Medicare Every year  Today     Services & Screenings Women Who How Often Need  Date of Last Service Action  Mammogram  Z12.31 Women over 4 One baseline ages 37-39. Annually ager 40 yrs+      Pap tests All women Annually if high risk. Every 2 yrs for normal risk women  Screening for cervical cancer with   Pap (Z01.419 nl or Z01.411abnl) &  HPV Z11.51 Women aged 10 to 54 Once every 5 yrs     Screening pelvic and breast exams All women Annually if high risk. Every 2 yrs for normal risk women     Sexually Transmitted Diseases  Chlamydia  Gonorrhea  Syphilis All at risk adults Annually for non pregnant females at increased risk         [place] Men Who How Ofter Need  Date of Last Service Action  Prostate Cancer - DRE & PSA Men over 50 Annually.  DRE might require a copay.  Yes     2014    Sexually Transmitted Diseases  Syphilis All at risk adults Annually for men at increased risk      Health Maintenance List Health Maintenance  Topic Date Due  . HEMOGLOBIN A1C  11/28/2020  . INFLUENZA VACCINE  04/03/2021  . FOOT EXAM  07/25/2021  . LIPID PANEL  07/25/2021  . PNA vac Low Risk Adult (2 of 2 - PPSV23) 07/25/2021  . COLONOSCOPY (Pts 45-5yrs  Insurance coverage will need to be confirmed)  08/12/2021  . OPHTHALMOLOGY EXAM  08/16/2021  . TETANUS/TDAP  06/20/2023  . COVID-19 Vaccine  Completed  . Hepatitis C Screening  Completed  . HPV VACCINES  Aged Out

## 2020-12-10 DIAGNOSIS — G4733 Obstructive sleep apnea (adult) (pediatric): Secondary | ICD-10-CM | POA: Diagnosis not present

## 2020-12-14 ENCOUNTER — Other Ambulatory Visit: Payer: Self-pay

## 2020-12-14 ENCOUNTER — Encounter: Payer: Self-pay | Admitting: Internal Medicine

## 2020-12-14 ENCOUNTER — Ambulatory Visit (INDEPENDENT_AMBULATORY_CARE_PROVIDER_SITE_OTHER): Payer: Medicare Other | Admitting: Internal Medicine

## 2020-12-14 DIAGNOSIS — N521 Erectile dysfunction due to diseases classified elsewhere: Secondary | ICD-10-CM

## 2020-12-14 DIAGNOSIS — Z0000 Encounter for general adult medical examination without abnormal findings: Secondary | ICD-10-CM | POA: Diagnosis not present

## 2020-12-14 DIAGNOSIS — E1140 Type 2 diabetes mellitus with diabetic neuropathy, unspecified: Secondary | ICD-10-CM | POA: Diagnosis not present

## 2020-12-14 NOTE — Progress Notes (Addendum)
This AWV is being conducted by [name] only. The patient was located at home and I was located in [facility]. The patient's identity was confirmed using their DOB and current address. The patient or his/her legal guardian has consented to being evaluated through a telephone encounter and understands the associated risks (an examination cannot be done and the patient may need to come in for an appointment) / benefits (allows the patient to remain at home, decreasing exposure to coronavirus). I personally spent 38 minutes conducting the AWV.  Subjective:   Juan Calderon is a 65 y.o. male who presents for a Medicare Annual Wellness Visit.  The following items have been reviewed and updated today in the appropriate area in the EMR.   Health Risk Assessment  Height, weight, BMI, and BP Visual acuity if needed Depression screen Fall risk / safety level Advance directive discussion Medical and family history were reviewed and updated Updating list of other providers & suppliers Medication reconciliation, including over the counter medicines Cognitive screen Written screening schedule Risk Factor list Personalized health advice, risky behaviors, and treatment advice  Social History   Social History Narrative   Current Social History 12/14/2020      Patient lives with a friend in a home which is 1 story. There are 4 steps with handrails up to the entrance the patient uses.       Patient's method of transportation is personal car.      The highest level of education was high school diploma.      The patient currently disabled.      Identified important Relationships are "Family"       Pets : None       Interests / Fun: "Watch TV"       Current Stressors: "None"      Religious / Personal Beliefs: Baptist       SChaplin, RN,BSN               Objective:    Vitals: There were no vitals taken for this visit. Vitals are unable to obtained due to [id number] public health  emergency  Activities of Daily Living In your present state of health, do you have any difficulty performing the following activities: 12/14/2020 11/02/2020  Hearing? N N  Vision? N N  Difficulty concentrating or making decisions? N N  Walking or climbing stairs? Y Y  Comment impaired mobility -  Dressing or bathing? Y N  Comment hard for him to reach his feet, friend helps him -  Doing errands, shopping? N N  Some recent data might be hidden    Goals Goals    . Blood Pressure < 130/80    . Exercise 2-3x per week (5 min per time)     Seated and standing exercises with exercise band    . HEMOGLOBIN A1C < 7.0    . Weight < 294 lb (133.4 kg)     5% weight loss       Fall Risk Fall Risk  12/14/2020 11/02/2020 10/24/2020 08/30/2020 07/25/2020  Falls in the past year? 0 0 0 1 1  Comment - - - - almost a year ago  Number falls in past yr: - 0 0 0 0  Injury with Fall? - 0 0 1 0  Comment - - - - right shoulder  Risk Factor Category  - - - - -  Risk for fall due to : Impaired mobility - - - Impaired balance/gait  Risk for fall due to: Comment - - - - -  Follow up Falls evaluation completed - - - Falls evaluation completed    Depression Screen PHQ 2/9 Scores 12/14/2020 11/02/2020 08/30/2020 07/25/2020  PHQ - 2 Score 0 0 0 0  PHQ- 9 Score 6 - 3 3     Cognitive Testing Six-Item Cognitive Screener   "I would like to ask you some questions that ask you to use your memory. I am going to name three objects. Please wait until I say all three words, then repeat them. Remember what they are  because I am going to ask you to name them again in a few minutes. Please repeat these words for me: APPLE--TABLE--PENNY." (Interviewer may repeat names 3 times if necessary but repetition not scored.)  Did patient correctly repeat all three words? Yes - may proceed with screen  What year is this? Incorrect What month is this? Correct What day of the week is this? Correct  What were the three objects  I asked you to remember? . Apple Correct . Table Incorrect . Penny Correct  Score one point for each incorrect answer.  A score of 2 or more points warrants additional investigation.  Patient's score 2   Opioid Screen:  0     Assessment and Plan:     Patient was scheduled for a f/u-RLE wound appointment with Dr. Truman Hayward on Monday, 12/19/20.  Pt states he is having white drainage from the wound and performing Q3day dressing changes.  Patient would like to establish care with a podiatrist, please place referral.  Patient does not want to see Dr. Kendell Bane.  Patient scored a 6 on the PHQ9, [facility] referral offered and pt declined.  Patient would like to continue his previous goal of increasing his activity level by continuing  seated and standing exercises with exercise band to increase strength and balance.  He was offered an appointment for nutritional and obesity counseling with Butch Penny, which he declined.  During the course of the visit the patient was educated and counseled about appropriate screening and preventive services as documented in the assessment and plan.  The printed AVS was given to the patient and included an updated screening schedule, a list of risk factors, and personalized health advice.        Juan Roger, RN  12/14/2020   I discussed the AWV findings with the RN who conducted the visit. I was present in the office suite and immediately available to provide assistance and direction throughout the time the service was provided.  Juan Anis, MD 12/14/2020, 3:01 PM Pager: [phone] After 5pm on weekdays and 1pm on weekends: On Call Pager: [phone]

## 2020-12-14 NOTE — Patient Instructions (Addendum)
All Notes   Progress Notes by Axel Filler, MD at 12/01/2020 11:13 AM  Author: Axel Filler, MD Author Type: Physician Filed: 12/06/2020 2:32 PM  Note Status: Signed Cosign: Cosign Not Required Encounter Date: 12/01/2020  Editor: Axel Filler, MD (Physician)             Things That May Be Affecting Your Health:  Alcohol  Hearing loss  Pain   x Depression  Home Safety  Sexual Health   Diabetes  Lack of physical activity  Stress   Difficulty with daily activities x Loneliness  Tiredness   Drug use  Medicines  Tobacco use   Falls  Motor Vehicle Safety x Weight   Food choices  Oral Health  Other    [place] : 1. Schedule your next subsequent Medicare Wellness visit in one year 2. Attend all of your regular appointments to address your medical issues 3. Complete the preventative screenings and services 4.  Continue on your goal of seated and standing exercises with exercise band to increase strength and balance, 2-3 times per week.   Annual Wellness Visit                       Medicare Covered Preventative Screenings and Services  Services & Screenings Men and Women Who How Often Need? Date of Last Service Action  Abdominal Aortic Aneurysm Adults with AAA risk factors Once      Alcohol Misuse and Counseling All Adults Screening once a year if no alcohol misuse. Counseling up to 4 face to face sessions.     Bone Density Measurement  Adults at risk for osteoporosis Once every 2 yrs      Lipid Panel Z13.6 All adults without CV disease Once every 5 yrs       Colorectal Cancer   Stool sample or  Colonoscopy All adults 30 and older   Once every year  Every 10 years        Depression All Adults Once a year  Today   Diabetes Screening Blood glucose, post glucose load, or GTT Z13.1  All adults at risk  Pre-diabetics  Once per year  Twice per year      Diabetes   Self-Management Training All adults Diabetics 10 hrs first year; 2 hours subsequent years. Requires Copay     Glaucoma  Diabetics  Family history of glaucoma  African Americans 56 yrs +  Hispanic Americans 75 yrs + Annually - requires coppay      Hepatitis C Z72.89 or F19.20  High Risk for HCV  Born between 1945 and 1965  Annually  Once      HIV Z11.4 All adults based on risk  Annually btw ages 71 & 31 regardless of risk  Annually > 65 yrs if at increased risk      Lung Cancer Screening Asymptomatic adults aged 7-77 with 30 pack yr history and current smoker OR quit within the last 15 yrs Annually Must have counseling and shared decision making documentation before first screen      Medical Nutrition Therapy Adults with   Diabetes  Renal disease  Kidney transplant within past 3 yrs 3 hours first year; 2 hours subsequent years     Obesity and Counseling All adults Screening once a year Counseling if BMI 30 or higher  Today   Tobacco Use Counseling Adults who use tobacco  Up to 8 visits in one year  Vaccines Z23  Hepatitis B  Influenza   Pneumonia  Adults   Once  Once every flu season  Two different vaccines separated by one year     Next Annual Wellness Visit People with Medicare Every year  Today     [place] Women Who How Often Need  Date of Last Service Action  Mammogram  Z12.31 Women over 88 One baseline ages 70-39. Annually ager 40 yrs+      Pap tests All women Annually if high risk. Every 2 yrs for normal risk women      Screening for cervical cancer with   Pap (Z01.419 nl or Z01.411abnl) &  HPV Z11.51 Women aged 59 to 63 Once every 5 yrs     Screening pelvic and breast exams All women Annually if high risk. Every 2 yrs for normal risk women     Sexually Transmitted Diseases  Chlamydia  Gonorrhea  Syphilis All at risk adults Annually for non pregnant females at increased  risk         [place] Men Who How Ofter Need  Date of Last Service Action  Prostate Cancer - DRE & PSA Men over 50 Annually.  DRE might require a copay.  Yes     2014    Sexually Transmitted Diseases  Syphilis All at risk adults Annually for men at increased risk             Diabetes Mellitus and [place] care is an important part of your health, especially when you have diabetes. Diabetes may cause you to have problems because of poor blood flow (circulation) to your feet and legs, which can cause your skin to:  Become thinner and drier.  Break more easily.  Heal more slowly.  Peel and crack. You may also have nerve damage (neuropathy) in your legs and feet, causing decreased feeling in them. This means that you may not notice minor injuries to your feet that could lead to more serious problems. Noticing and addressing any potential problems early is the best way to prevent future foot problems. How to care for your feet Foot hygiene  Wash your feet daily with warm water and mild soap. Do not use hot water. Then, pat your feet and the areas between your toes until they are completely dry. Do not soak your feet as this can dry your skin.  Trim your toenails straight across. Do not dig under them or around the cuticle. File the edges of your nails with an emery board or nail file.  Apply a moisturizing lotion or petroleum jelly to the skin on your feet and to dry, brittle toenails. Use lotion that does not contain alcohol and is unscented. Do not apply lotion between your toes.   Shoes and socks  Wear clean socks or stockings every day. Make sure they are not too tight. Do not wear knee-high stockings since they may decrease blood flow to your legs.  Wear shoes that fit properly and have enough cushioning. Always look in your shoes before you put them on to be sure there are no objects inside.  To break in new shoes, wear them for just a  few hours a day. This prevents injuries on your feet. Wounds, scrapes, corns, and calluses  Check your feet daily for blisters, cuts, bruises, sores, and redness. If you cannot see the bottom of your feet, use a mirror or ask someone for help.  Do not cut corns or  calluses or try to remove them with medicine.  If you find a minor scrape, cut, or break in the skin on your feet, keep it and the skin around it clean and dry. You may clean these areas with mild soap and water. Do not clean the area with peroxide, alcohol, or iodine.  If you have a wound, scrape, corn, or callus on your foot, look at it several times a day to make sure it is healing and not infected. Check for: ? Redness, swelling, or pain. ? Fluid or blood. ? Warmth. ? Pus or a bad smell.   General tips  Do not cross your legs. This may decrease blood flow to your feet.  Do not use heating pads or hot water bottles on your feet. They may burn your skin. If you have lost feeling in your feet or legs, you may not know this is happening until it is too late.  Protect your feet from hot and cold by wearing shoes, such as at the beach or on hot pavement.  Schedule a complete foot exam at least once a year (annually) or more often if you have foot problems. Report any cuts, sores, or bruises to your health care provider immediately. Where to find more information  American Diabetes Association: www.diabetes.org  Association of Diabetes Care & Education Specialists: www.diabeteseducator.org Contact a health care provider if:  You have a medical condition that increases your risk of infection and you have any cuts, sores, or bruises on your feet.  You have an injury that is not healing.  You have redness on your legs or feet.  You feel burning or tingling in your legs or feet.  You have pain or cramps in your legs and feet.  Your legs or feet are numb.  Your feet always feel cold.  You have pain around any  toenails. Get help right away if:  You have a wound, scrape, corn, or callus on your foot and: ? You have pain, swelling, or redness that gets worse. ? You have fluid or blood coming from the wound, scrape, corn, or callus. ? Your wound, scrape, corn, or callus feels warm to the touch. ? You have pus or a bad smell coming from the wound, scrape, corn, or callus. ? You have a fever. ? You have a red line going up your leg. Summary  Check your feet every day for blisters, cuts, bruises, sores, and redness.  Apply a moisturizing lotion or petroleum jelly to the skin on your feet and to dry, brittle toenails.  Wear shoes that fit properly and have enough cushioning.  If you have foot problems, report any cuts, sores, or bruises to your health care provider immediately.  Schedule a complete foot exam at least once a year (annually) or more often if you have foot problems. This information is not intended to replace advice given to you by your health care provider. Make sure you discuss any questions you have with your health care provider. Document Revised: 03/10/2020 Document Reviewed: 03/10/2020 Elsevier Patient Education  [place] Maintenance, Male Adopting a healthy lifestyle and getting preventive care are important in promoting health and wellness. Ask your health care provider about:  The right schedule for you to have regular tests and exams.  Things you can do on your own to prevent diseases and keep yourself healthy. What should I know about diet, weight, and exercise? Eat a healthy diet  Eat a diet that  includes plenty of vegetables, fruits, low-fat dairy products, and lean protein.  Do not eat a lot of foods that are high in solid fats, added sugars, or sodium.   Maintain a healthy weight Body mass index (BMI) is a measurement that can be used to identify possible weight problems. It estimates body fat based on height and weight. Your health care  provider can help determine your BMI and help you achieve or maintain a healthy weight. Get regular exercise Get regular exercise. This is one of the most important things you can do for your health. Most adults should:  Exercise for at least 150 minutes each week. The exercise should increase your heart rate and make you sweat (moderate-intensity exercise).  Do strengthening exercises at least twice a week. This is in addition to the moderate-intensity exercise.  Spend less time sitting. Even light physical activity can be beneficial. Watch cholesterol and blood lipids Have your blood tested for lipids and cholesterol at 67 years of age, then have this test every 5 years. You may need to have your cholesterol levels checked more often if:  Your lipid or cholesterol levels are high.  You are older than 67 years of age.  You are at high risk for heart disease. What should I know about cancer screening? Many types of cancers can be detected early and may often be prevented. Depending on your health history and family history, you may need to have cancer screening at various ages. This may include screening for:  Colorectal cancer.  Prostate cancer.  Skin cancer.  Lung cancer. What should I know about heart disease, diabetes, and high blood pressure? Blood pressure and heart disease  High blood pressure causes heart disease and increases the risk of stroke. This is more likely to develop in people who have high blood pressure readings, are of African descent, or are overweight.  Talk with your health care provider about your target blood pressure readings.  Have your blood pressure checked: ? Every 3-5 years if you are 67-58 years of age. ? Every year if you are 57 years old or older.  If you are between the ages of 24 and 49 and are a current or former smoker, ask your health care provider if you should have a one-time screening for abdominal aortic aneurysm  (AAA). Diabetes Have regular diabetes screenings. This checks your fasting blood sugar level. Have the screening done:  Once every three years after age 34 if you are at a normal weight and have a low risk for diabetes.  More often and at a younger age if you are overweight or have a high risk for diabetes. What should I know about preventing infection? Hepatitis B If you have a higher risk for hepatitis B, you should be screened for this virus. Talk with your health care provider to find out if you are at risk for hepatitis B infection. Hepatitis C Blood testing is recommended for:  Everyone born from [birth year] through 1965.  Anyone with known risk factors for hepatitis C. Sexually transmitted infections (STIs)  You should be screened each year for STIs, including gonorrhea and chlamydia, if: ? You are sexually active and are younger than 67 years of age. ? You are older than 67 years of age and your health care provider tells you that you are at risk for this type of infection. ? Your sexual activity has changed since you were last screened, and you are at increased risk for chlamydia or gonorrhea.  Ask your health care provider if you are at risk.  Ask your health care provider about whether you are at high risk for HIV. Your health care provider may recommend a prescription medicine to help prevent HIV infection. If you choose to take medicine to prevent HIV, you should first get tested for HIV. You should then be tested every 3 months for as long as you are taking the medicine. Follow these instructions at home: Lifestyle  Do not use any products that contain nicotine or tobacco, such as cigarettes, e-cigarettes, and chewing tobacco. If you need help quitting, ask your health care provider.  Do not use street drugs.  Do not share needles.  Ask your health care provider for help if you need support or information about quitting drugs. Alcohol use  Do not drink alcohol if your health  care provider tells you not to drink.  If you drink alcohol: ? Limit how much you have to 0-2 drinks a day. ? Be aware of how much alcohol is in your drink. In the [place] one drink equals one 12 oz bottle of beer (355 mL), one 5 oz glass of wine (148 mL), or one 1 oz glass of hard liquor (44 mL). General instructions  Schedule regular health, dental, and eye exams.  Stay current with your vaccines.  Tell your health care provider if: ? You often feel depressed. ? You have ever been abused or do not feel safe at home. Summary  Adopting a healthy lifestyle and getting preventive care are important in promoting health and wellness.  Follow your health care provider's instructions about healthy diet, exercising, and getting tested or screened for diseases.  Follow your health care provider's instructions on monitoring your cholesterol and blood pressure. This information is not intended to replace advice given to you by your health care provider. Make sure you discuss any questions you have with your health care provider. Document Revised: 08/13/2018 Document Reviewed: 08/13/2018 Elsevier Patient Education  2021 [employer].

## 2020-12-15 NOTE — Progress Notes (Signed)
Internal Medicine Clinic Attending  Case discussed with Dr. Lee at the time of the visit.  We reviewed the AWV findings.  I agree with the assessment, diagnosis, and plan of care documented in the AWV note.      

## 2020-12-19 ENCOUNTER — Ambulatory Visit (INDEPENDENT_AMBULATORY_CARE_PROVIDER_SITE_OTHER): Payer: Medicare Other | Admitting: Internal Medicine

## 2020-12-19 ENCOUNTER — Encounter: Payer: Self-pay | Admitting: Internal Medicine

## 2020-12-19 ENCOUNTER — Other Ambulatory Visit: Payer: Self-pay

## 2020-12-19 VITALS — BP 108/63 | HR 54 | Temp 98.0°F | Ht 72.0 in | Wt 301.0 lb

## 2020-12-19 DIAGNOSIS — S81811D Laceration without foreign body, right lower leg, subsequent encounter: Secondary | ICD-10-CM | POA: Diagnosis not present

## 2020-12-19 DIAGNOSIS — E1140 Type 2 diabetes mellitus with diabetic neuropathy, unspecified: Secondary | ICD-10-CM | POA: Diagnosis not present

## 2020-12-19 DIAGNOSIS — N521 Erectile dysfunction due to diseases classified elsewhere: Secondary | ICD-10-CM | POA: Diagnosis not present

## 2020-12-19 LAB — POCT GLYCOSYLATED HEMOGLOBIN (HGB A1C): Hemoglobin A1C: 5.6 % (ref 4.0–5.6)

## 2020-12-19 LAB — GLUCOSE, CAPILLARY: Glucose-Capillary: 107 mg/dL — ABNORMAL HIGH (ref 70–99)

## 2020-12-19 NOTE — Patient Instructions (Addendum)
Thank you for allowing [place] to provide your care today. Today we discussed your elbow    I have ordered bmp, mag labs for you. I will call if any are abnormal.    Today we made no changes to your medications.    Please follow-up 3 months.    Should you have any questions or concerns please call the internal medicine clinic at [phone].     Cervical Radiculopathy  Cervical radiculopathy means that a nerve in the neck (a cervical nerve) is pinched or bruised. This can happen because of an injury to the cervical spine (vertebrae) in the neck, or as a normal part of getting older. This can cause pain or loss of feeling (numbness) that runs from your neck all the way down to your arm and fingers. Often, this condition gets better with rest. Treatment may be needed if the condition does not get better. What are the causes?  A neck injury.  A bulging disk in your spine.  Muscle movements that you cannot control (muscle spasms).  Tight muscles in your neck due to overuse.  Arthritis.  Breakdown in the bones and joints of the spine (spondylosis) due to getting older.  Bone spurs that form near the nerves in the neck. What are the signs or symptoms?  Pain. The pain may: ? Run from the neck to the arm and hand. ? Be very bad or irritating. ? Be worse when you move your neck.  Loss of feeling or tingling in your arm or hand.  Weakness in your arm or hand, in very bad cases. How is this treated? In many cases, treatment is not needed for this condition. With rest, the condition often gets better over time. If treatment is needed, options may include:  Wearing a soft neck collar (cervical collar) for short periods of time, as told by your doctor.  Doing exercises (physical therapy) to strengthen your neck muscles.  Taking medicines.  Having shots (injections) in your spine, in very bad cases.  Having surgery. This may be needed if other treatments do not help. The type of  surgery that is used depends on the cause of your condition. Follow these instructions at home: If you have a soft neck collar:  Wear it as told by your doctor. Remove it only as told by your doctor.  Ask your doctor if you can remove the collar for cleaning and bathing. If you are allowed to remove the collar for cleaning or bathing: ? Follow instructions from your doctor about how to remove the collar safely. ? Clean the collar by wiping it with mild soap and water and drying it completely. ? Take out any removable pads in the collar every 1-2 days. Wash them by hand with soap and water. Let them air-dry completely before you put them back in the collar. ? Check your skin under the collar for redness or sores. If you see any, tell your doctor. Managing pain  Take over-the-counter and prescription medicines only as told by your doctor.  If told, put ice on the painful area. ? If you have a soft neck collar, remove it as told by your doctor. ? Put ice in a plastic bag. ? Place a towel between your skin and the bag. ? Leave the ice on for 20 minutes, 2-3 times a day.  If using ice does not help, you can try using heat. Use the heat source that your doctor recommends, such as a moist heat pack  or a heating pad. ? Place a towel between your skin and the heat source. ? Leave the heat on for 20-30 minutes. ? Remove the heat if your skin turns bright red. This is very important if you are unable to feel pain, heat, or cold. You may have a greater risk of getting burned.  You may try a gentle neck and shoulder rub (massage).      Activity  Rest as needed.  Return to your normal activities as told by your doctor. Ask your doctor what activities are safe for you.  Do exercises as told by your doctor or physical therapist.  Do not lift anything that is heavier than 10 lb (4.5 kg) until your doctor tells you that it is safe. General instructions  Use a flat pillow when you sleep.  Do  not drive while wearing a soft neck collar. If you do not have a soft neck collar, ask your doctor if it is safe to drive while your neck heals.  Ask your doctor if the medicine prescribed to you requires you to avoid driving or using heavy machinery.  Do not use any products that contain nicotine or tobacco, such as cigarettes, e-cigarettes, and chewing tobacco. These can delay healing. If you need help quitting, ask your doctor.  Keep all follow-up visits as told by your doctor. This is important. Contact a doctor if:  Your condition does not get better with treatment. Get help right away if:  Your pain gets worse and is not helped with medicine.  You lose feeling or feel weak in your hand, arm, face, or leg.  You have a high fever.  You have a stiff neck.  You cannot control when you poop or pee (have incontinence).  You have trouble with walking, balance, or talking. Summary  Cervical radiculopathy means that a nerve in the neck is pinched or bruised.  A nerve can get pinched from a bulging disk, arthritis, an injury to the neck, or other causes.  Symptoms include pain, tingling, or loss of feeling that goes from the neck into the arm or hand.  Weakness in your arm or hand can happen in very bad cases.  Treatment may include resting, wearing a soft neck collar, and doing exercises. You might need to take medicines for pain. In very bad cases, shots or surgery may be needed. This information is not intended to replace advice given to you by your health care provider. Make sure you discuss any questions you have with your health care provider. Document Revised: 07/11/2018 Document Reviewed: 07/11/2018 Elsevier Patient Education  2021 [employer].

## 2020-12-19 NOTE — Progress Notes (Signed)
CC: leg wound  HPI: [name] is a 65 y.o. with PMH listed below presenting with complaint of leg wound. Please see problem based assessment and plan for further details.  Past Medical History:  Diagnosis Date  . Alcohol abuse    . Bradycardia   . C6 radiculopathy 01/24/2016   Right upper extremity, mild to moderate electrically by EMG on 01/24/2016  . Cataract    Left eye  . Chronic diastolic heart failure ([place])     with mild left ventricular hypertrophy on Echo 02/2010  . Chronic obstructive pulmonary disease ([place])    . Chronic osteomyelitis of femur ([place]) 04/06/2016  . Chronic osteomyelitis of left femur ([place]) 11/22/2017   Left femur s/p prior trauma  . Chronic osteomyelitis of left femur ([place]) 11/22/2017   Brodie's abscess: left femur s/p prior trauma.  Underwent partial excision and curettage of left femoral osteomyelitis at [facility] 12/30/2017 with grossly purulent material encountered within the medullary canal of the left distal femur.  Cultures grew MSSA.  Post-operatively received 6 weeks of IV antibiotics through 02/10/2018.  CRP elevated at 60.3 at end of IV antibiotic course so continued on Keflex  . Chronic pain syndrome     Left arm and leg s/p traumatic injury   . Chronic renal insufficiency    . Coronary artery disease     25% LAD stenosis on cath 2007.  Stable angina.  . Diverticulosis    . Diverticulosis 11/12/2013  . Essential hypertension    . Frequent PVCs   . Gastroesophageal reflux disease    . Gout    . Hyperlipidemia LDL goal < 100    . Internal hemorrhoids without complication 39/11/90  . Long-term current use of opiate analgesic 09/07/2016  . Mild carpal tunnel syndrome of right wrist 01/24/2016   Mild degree electrically per EMG 01/24/2016   . Mild carpal tunnel syndrome of right wrist 01/24/2016   Mild degree electrically per EMG 01/24/2016   . Morbid obesity with BMI of 40.0-44.9, adult ([place])    . Normocytic anemia    . NSVT (nonsustained ventricular  tachycardia) ([place])   . Obstructive sleep apnea     Moderate, AHI 29.8 per hour with moderately loud snoring and oxygen desaturation to a nadir of 79%. CPAP titration resulted in a prescription for 17 CWP.    [name] Open-angle glaucoma    . Osteoarthritis cervical spine    . Osteoarthritis of left knee 06/19/2013   Tricompartmental disease.  Treated with double hinged upright knee brace, steroid/xylocaine knee injections, and NSAIDs   . Osteoporosis 05/14/2017   s/p fracture of the right humerus from a fall at ground hight  . Persistent atrial fibrillation ([place])   . Right rotator cuff tear     Large full-thickness tear of the supraspinatus with mild retraction but no atrophy   . Right rotator cuff tear 04/25/2013   Large full-thickness tear of the supraspinatus with mild retraction but no atrophy    . Secondary male hypogonadism 02/07/2017   Likely secondary to chronic opioid use  . Secondary male hypogonadism 02/07/2017   Likely secondary to chronic opioid use  . Subclinical hypothyroidism    . Tubular adenoma of colon 11/22/2017   Specifics unknown.  Repeat colonoscopy 08/12/2018 with six 3-6 mm tubular adenomas removed endoscopically.  . 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 ([place])    . Vasomotor rhinitis 04/25/2013   Review of Systems: Review of Systems  Constitutional: Negative for  chills, fever and malaise/fatigue.  Eyes: Negative for blurred vision.  Respiratory: Negative for shortness of breath.   Cardiovascular: Negative for chest pain, palpitations and leg swelling.  Gastrointestinal: Negative for constipation, diarrhea, nausea and vomiting.  Musculoskeletal: Positive for joint pain and neck pain.  Neurological: Positive for tingling. Negative for dizziness and focal weakness.  All other systems reviewed and are negative.   Physical Exam: Vitals:   12/19/20 1336  BP: 108/63  Pulse: (!) 54  Temp: 98 F (36.7 C)  TempSrc: Oral  SpO2: 97%  Weight:  (!) 301 lb (136.5 kg)  Height: 6' (1.829 m)   Gen: Well-developed, well nourished, NAD HEENT: NCAT head, hearing intact CV: RRR, S1, S2 normal Pulm: CTAB, No rales, no wheezes Extm: Limited passive and active inversion of L elbow to 90 degrees, extension, flexion, eversion ROM intact. Skin: Dry, Warm, normal turgor, well-healed surgical scars on L elbow, Well healing wound ~1cm in diameter on anterior knee with clean base without surrounding edema, warmth, or drainage  Assessment & Plan:   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 ([place]) Lab Results  Component Value Date   HGBA1C 5.6 12/19/2020   Improved with metformin and diet. Tolerating metformin well without significant side effects. Diabetic foot exam performed today.  - C/w metformin 1000mg  bid  Noninfected skin tear of right leg [name] is a 65 yo M w/ PMH of T2DM presenting for f/u management of traumatic ulcer of his right leg. He mentions since the debridement, the wound appears to be improving. Denies any significant pain, swelling or drainage. Mentions using dressings at home without difficulty. Denies any fevers, chills, nausea, vomiting, diarrhea.  A/P Significant improvement on visual inspection. Advised on keeping the wound site clean and leave to air to allow to scab - Monitor  Hypomagnesemia Noted to have hx of recurrent hypomagnesemia. Found on recent lab. Started on oral mag repletion without significant GI side effects. Denies chest pain, palpitations  A/P Present w/ recurrent hypomagnesemia. Thought to be multifactorial due to diuretic + PPI use. Started on oral repletion 2 months prior. Will re-check this visit. - Bmp, Mag level  Hypocalcemia Found to be hypocalcemic in December. Treated w/ vitamin D, calcium oral supplement. Recent lab show very mild / upper normal hypercalcemia. Will recheck this visit to assess for inappropriately high calcium level  - BMP   Patient  discussed with Dr. Evette Doffing  -Gilberto Better, [facility] Pager: [phone]

## 2020-12-19 NOTE — Assessment & Plan Note (Addendum)
Noted to have hx of recurrent hypomagnesemia. Found on recent lab. Started on oral mag repletion without significant GI side effects. Denies chest pain, palpitations  A/P Present w/ recurrent hypomagnesemia. Thought to be multifactorial due to diuretic + PPI use. Started on oral repletion 2 months prior. Will re-check this visit. - Bmp, Mag level  Addendum: Mag 1.3, Improving but not replete completely. C/w oral repletion

## 2020-12-19 NOTE — Assessment & Plan Note (Signed)
Lab Results  Component Value Date   HGBA1C 5.6 12/19/2020   Improved with metformin and diet. Tolerating metformin well without significant side effects. Diabetic foot exam performed today.  - C/w metformin 1000mg  bid

## 2020-12-19 NOTE — Assessment & Plan Note (Addendum)
Found to be hypocalcemic in December. Treated w/ vitamin D, calcium oral supplement. Recent lab show very mild / upper normal hypercalcemia. Will recheck this visit to assess for inappropriately high calcium level  - BMP  Addendum: Calcium level wnl at 10.1

## 2020-12-19 NOTE — Assessment & Plan Note (Signed)
Mr.Scogin is a 65 yo M w/ PMH of T2DM presenting for f/u management of traumatic ulcer of his right leg. He mentions since the debridement, the wound appears to be improving. Denies any significant pain, swelling or drainage. Mentions using dressings at home without difficulty. Denies any fevers, chills, nausea, vomiting, diarrhea.  A/P Significant improvement on visual inspection. Advised on keeping the wound site clean and leave to air to allow to scab - Monitor

## 2020-12-20 LAB — BMP8+ANION GAP
Anion Gap: 19 mmol/L — ABNORMAL HIGH (ref 10.0–18.0)
BUN/Creatinine Ratio: 14 (ref 10–24)
BUN: 26 mg/dL (ref 8–27)
CO2: 23 mmol/L (ref 20–29)
Calcium: 10.1 mg/dL (ref 8.6–10.2)
Chloride: 99 mmol/L (ref 96–106)
Creatinine, Ser: 1.81 mg/dL — ABNORMAL HIGH (ref 0.76–1.27)
Glucose: 109 mg/dL — ABNORMAL HIGH (ref 65–99)
Potassium: 5 mmol/L (ref 3.5–5.2)
Sodium: 141 mmol/L (ref 134–144)
eGFR: 41 mL/min/{1.73_m2} — ABNORMAL LOW (ref >59)

## 2020-12-20 LAB — MAGNESIUM: Magnesium: 1.3 mg/dL — ABNORMAL LOW (ref 1.6–2.3)

## 2020-12-20 NOTE — Progress Notes (Signed)
Internal Medicine Clinic Attending  Case discussed with Dr. Lee  At the time of the visit.  We reviewed the resident's history and exam and pertinent patient test results.  I agree with the assessment, diagnosis, and plan of care documented in the resident's note.    

## 2020-12-21 ENCOUNTER — Telehealth: Payer: Self-pay | Admitting: Internal Medicine

## 2020-12-21 NOTE — Telephone Encounter (Signed)
Spoke with and Juan Calderon regarding his lab results. Mr.Mckone expressed understanding. All other question and concerns addressed.

## 2020-12-22 ENCOUNTER — Other Ambulatory Visit: Payer: Self-pay

## 2020-12-22 ENCOUNTER — Ambulatory Visit (INDEPENDENT_AMBULATORY_CARE_PROVIDER_SITE_OTHER): Payer: Medicare Other | Admitting: Podiatry

## 2020-12-22 DIAGNOSIS — E1140 Type 2 diabetes mellitus with diabetic neuropathy, unspecified: Secondary | ICD-10-CM | POA: Diagnosis not present

## 2020-12-22 DIAGNOSIS — B351 Tinea unguium: Secondary | ICD-10-CM | POA: Diagnosis not present

## 2020-12-22 DIAGNOSIS — M79675 Pain in left toe(s): Secondary | ICD-10-CM

## 2020-12-22 DIAGNOSIS — M79674 Pain in right toe(s): Secondary | ICD-10-CM

## 2020-12-22 DIAGNOSIS — N521 Erectile dysfunction due to diseases classified elsewhere: Secondary | ICD-10-CM | POA: Diagnosis not present

## 2020-12-23 ENCOUNTER — Other Ambulatory Visit: Payer: Self-pay | Admitting: Internal Medicine

## 2020-12-23 DIAGNOSIS — G609 Hereditary and idiopathic neuropathy, unspecified: Secondary | ICD-10-CM

## 2020-12-27 ENCOUNTER — Encounter: Payer: Self-pay | Admitting: Podiatry

## 2020-12-27 NOTE — Progress Notes (Signed)
  Subjective:  Patient ID: Juan Calderon, male    DOB: 1956-01-19,  MRN: [id number]  Chief Complaint  Patient presents with  . Nail Problem    Nail trim    65 y.o. male returns for the above complaint.  Patient presents with thickened elongated dystrophic toenails x10.  Painful to touch.  He would like to have them debrided down as he is not able to do it himself.  He states that he is a well-controlled diabetic with last A1c of 5.4%.  He would like to debride the nails down.  Is painful to walk on.  Objective:  There were no vitals filed for this visit. Podiatric Exam: Vascular: dorsalis pedis and posterior tibial pulses are palpable bilateral. Capillary return is immediate. Temperature gradient is WNL. Skin turgor WNL  Sensorium: Normal Semmes Weinstein monofilament test. Normal tactile sensation bilaterally. Nail Exam: Pt has thick disfigured discolored nails with subungual debris noted bilateral entire nail hallux through fifth toenails.  Pain on palpation to the nails. Ulcer Exam: There is no evidence of ulcer or pre-ulcerative changes or infection. Orthopedic Exam: Muscle tone and strength are WNL. No limitations in general ROM. No crepitus or effusions noted. HAV  B/L.  Hammer toes 2-5  B/L. Skin: No Porokeratosis. No infection or ulcers    Assessment & Plan:   1. Pain due to onychomycosis of toenails of both feet   2. 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 (HCC)     Patient was evaluated and treated and all questions answered.  Onychomycosis with pain  -Nails palliatively debrided as below. -Educated on self-care  Procedure: Nail Debridement Rationale: pain  Type of Debridement: manual, sharp debridement. Instrumentation: Nail nipper, rotary burr. Number of Nails: 10  Procedures and Treatment: Consent by patient was obtained for treatment procedures. The patient understood the discussion of treatment and procedures well. All questions  were answered thoroughly reviewed. Debridement of mycotic and hypertrophic toenails, 1 through 5 bilateral and clearing of subungual debris. No ulceration, no infection noted.  Return Visit-Office Procedure: Patient instructed to return to the office for a follow up visit 3 months for continued evaluation and treatment.  Boneta Lucks, DPM    No follow-ups on file.

## 2020-12-28 ENCOUNTER — Other Ambulatory Visit: Payer: Self-pay | Admitting: Student in an Organized Health Care Education/Training Program

## 2020-12-30 ENCOUNTER — Telehealth: Payer: Self-pay | Admitting: Internal Medicine

## 2020-12-30 NOTE — Telephone Encounter (Signed)
Spoke with [name] and discussed his lab results, including his low magnesium and need to c/w repletion. [name] expressed understanding.

## 2021-01-08 ENCOUNTER — Other Ambulatory Visit: Payer: Self-pay | Admitting: Internal Medicine

## 2021-01-09 DIAGNOSIS — G4733 Obstructive sleep apnea (adult) (pediatric): Secondary | ICD-10-CM | POA: Diagnosis not present

## 2021-01-23 ENCOUNTER — Ambulatory Visit (INDEPENDENT_AMBULATORY_CARE_PROVIDER_SITE_OTHER): Payer: Medicare Other | Admitting: Student in an Organized Health Care Education/Training Program

## 2021-01-23 ENCOUNTER — Encounter: Payer: Self-pay | Admitting: Student in an Organized Health Care Education/Training Program

## 2021-01-23 VITALS — BP 143/73 | HR 52 | Temp 97.8°F | Ht 72.0 in | Wt 299.0 lb

## 2021-01-23 DIAGNOSIS — N529 Male erectile dysfunction, unspecified: Secondary | ICD-10-CM | POA: Diagnosis not present

## 2021-01-23 DIAGNOSIS — Z79891 Long term (current) use of opiate analgesic: Secondary | ICD-10-CM | POA: Diagnosis not present

## 2021-01-23 MED ORDER — OXYCODONE-ACETAMINOPHEN 10-325 MG PO TABS: 1.0000 | ORAL_TABLET | Freq: Three times a day (TID) | ORAL | 0 refills | Status: DC | PRN

## 2021-01-23 MED ORDER — TADALAFIL 10 MG PO TABS: 10.0000 mg | ORAL_TABLET | ORAL | 1 refills | Status: AC | PRN

## 2021-01-23 NOTE — Assessment & Plan Note (Signed)
History of erectile dysfunction, chronic, stable but not satisfied with current condition.  Did not find much benefit with sildenafil.  He uses a manual pump which has minimal benefit.  We talked about trial of tadalafil which is a little easier dosing.  He is interested in other devices like an automatic pump.  I advised that if the tadalafil is not effective we should refer him to urology for consideration of devices that would be right for him.

## 2021-01-23 NOTE — Assessment & Plan Note (Signed)
Current weight is 299 pounds, BMI of 40.  This is stable.  Patient has struggled to lose weight for many years.  We talked about medications, could be eligible for GLP-1 agonist but unwilling to do self injections.  Not a good candidate for other medications given hypertension and heart disease.  Patient is here to continue to consider, I think he could be a good candidate for once weekly Ozempic, will discuss more at future visits.

## 2021-01-23 NOTE — Assessment & Plan Note (Signed)
Chronic hypomagnesemia, on repletion with magnesium oxide 400 mg daily.  Unable to tolerate twice daily due to diarrhea.  Last magnesium was 1.3 one month ago.  I will see much benefit in checking again as he is on the maximum tolerated magnesium supplement and has not had any further symptoms.  We will check again at next routine blood draw.

## 2021-01-23 NOTE — Assessment & Plan Note (Signed)
Chronic, stable.  Pain generators include osteoarthritis of his low back, hip, and left elbow.  No issues with chronic oxycodone, he finds this medication helpful, improves his functional status.  Last tox assure in February was appropriate.  I reviewed the database which is appropriate.  Plan to continue with oxycodone 10 mg, #100 for a 1 month supply, I sent 3 months worth to his pharmacy.  Follow-up with me in 3 months.
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
## 2021-01-27 ENCOUNTER — Other Ambulatory Visit: Payer: Self-pay | Admitting: Student in an Organized Health Care Education/Training Program

## 2021-01-27 DIAGNOSIS — I5032 Chronic diastolic (congestive) heart failure: Secondary | ICD-10-CM

## 2021-02-09 DIAGNOSIS — G4733 Obstructive sleep apnea (adult) (pediatric): Secondary | ICD-10-CM | POA: Diagnosis not present

## 2021-02-22 DIAGNOSIS — G4733 Obstructive sleep apnea (adult) (pediatric): Secondary | ICD-10-CM | POA: Diagnosis not present

## 2021-03-07 ENCOUNTER — Encounter: Payer: Self-pay | Admitting: *Deleted

## 2021-03-11 DIAGNOSIS — G4733 Obstructive sleep apnea (adult) (pediatric): Secondary | ICD-10-CM | POA: Diagnosis not present

## 2021-03-22 ENCOUNTER — Other Ambulatory Visit: Payer: Self-pay | Admitting: Student in an Organized Health Care Education/Training Program

## 2021-03-22 DIAGNOSIS — M1A00X0 Idiopathic chronic gout, unspecified site, without tophus (tophi): Secondary | ICD-10-CM

## 2021-03-27 ENCOUNTER — Other Ambulatory Visit: Payer: Self-pay | Admitting: Student in an Organized Health Care Education/Training Program

## 2021-03-27 DIAGNOSIS — G609 Hereditary and idiopathic neuropathy, unspecified: Secondary | ICD-10-CM

## 2021-03-29 ENCOUNTER — Other Ambulatory Visit: Payer: Self-pay | Admitting: Student in an Organized Health Care Education/Training Program

## 2021-03-29 DIAGNOSIS — I25119 Atherosclerotic heart disease of native coronary artery with unspecified angina pectoris: Secondary | ICD-10-CM

## 2021-03-29 DIAGNOSIS — I10 Essential (primary) hypertension: Secondary | ICD-10-CM

## 2021-04-11 DIAGNOSIS — G4733 Obstructive sleep apnea (adult) (pediatric): Secondary | ICD-10-CM | POA: Diagnosis not present

## 2021-04-12 ENCOUNTER — Other Ambulatory Visit: Payer: Self-pay | Admitting: *Deleted

## 2021-04-12 DIAGNOSIS — K5903 Drug induced constipation: Secondary | ICD-10-CM

## 2021-04-12 MED ORDER — SORBITOL 70 % RE SOLN: 15.0000 mL | Freq: Every day | RECTAL | 5 refills | Status: DC | PRN

## 2021-04-24 ENCOUNTER — Ambulatory Visit (INDEPENDENT_AMBULATORY_CARE_PROVIDER_SITE_OTHER): Payer: Medicare Other | Admitting: Student in an Organized Health Care Education/Training Program

## 2021-04-24 ENCOUNTER — Other Ambulatory Visit: Payer: Self-pay

## 2021-04-24 ENCOUNTER — Encounter: Payer: Self-pay | Admitting: Student in an Organized Health Care Education/Training Program

## 2021-04-24 VITALS — BP 134/70 | HR 50 | Temp 97.8°F | Ht 72.0 in | Wt 298.8 lb

## 2021-04-24 DIAGNOSIS — N521 Erectile dysfunction due to diseases classified elsewhere: Secondary | ICD-10-CM

## 2021-04-24 DIAGNOSIS — I480 Paroxysmal atrial fibrillation: Secondary | ICD-10-CM

## 2021-04-24 DIAGNOSIS — G4733 Obstructive sleep apnea (adult) (pediatric): Secondary | ICD-10-CM

## 2021-04-24 DIAGNOSIS — E1140 Type 2 diabetes mellitus with diabetic neuropathy, unspecified: Secondary | ICD-10-CM | POA: Diagnosis not present

## 2021-04-24 DIAGNOSIS — N529 Male erectile dysfunction, unspecified: Secondary | ICD-10-CM | POA: Diagnosis not present

## 2021-04-24 DIAGNOSIS — Z79891 Long term (current) use of opiate analgesic: Secondary | ICD-10-CM | POA: Diagnosis not present

## 2021-04-24 LAB — POCT GLYCOSYLATED HEMOGLOBIN (HGB A1C): Hemoglobin A1C: 6.3 % — AB (ref 4.0–5.6)

## 2021-04-24 LAB — GLUCOSE, CAPILLARY: Glucose-Capillary: 204 mg/dL — ABNORMAL HIGH (ref 70–99)

## 2021-04-24 MED ORDER — OXYCODONE-ACETAMINOPHEN 10-325 MG PO TABS: 1.0000 | ORAL_TABLET | Freq: Three times a day (TID) | ORAL | 0 refills | Status: DC | PRN

## 2021-04-24 NOTE — Assessment & Plan Note (Signed)
Symptomatically stable, he followed through on referral to urology.  Unfortunately they did not have any solutions for his question about an automated pump.  They offered him penile injections but he was not interested at this time.  We will plan to continue with Cialis as needed.

## 2021-04-24 NOTE — Assessment & Plan Note (Addendum)
Symptomatically doing well.  Tolerating anticoagulation with Eliquis well, no bleeding events.  Renal function stable.  He is being comanaged with cardiology, they prescribed flecainide which she is continuing.  Pretty good exertional capacity, a little limited by shortness of breath which is likely multifactorial.  No symptoms of palpitations.  Doing well on CPAP for the last 1 year.  Good adherence with magnesium oxide which she uses for chronically low magnesium levels.

## 2021-04-24 NOTE — Assessment & Plan Note (Signed)
Under great control, hemoglobin A1c under 7%.  Plan to continue with metformin 1000 mg twice daily.  Continue with losartan for renal protection.

## 2021-04-24 NOTE — Assessment & Plan Note (Signed)
Tolerating CPAP fairly well.  He reports using it consistently.  Has been using it for about 1 year.  We will plan to continue, reports that equipment is working well.

## 2021-04-24 NOTE — Assessment & Plan Note (Addendum)
Chronic pain generators are osteoarthritis in knees, hips, and low back.  Currently stable, function is limited but overall doing okay.  Finding good benefit with oxycodone, no recent dose increases.  Uses about 4 tablets daily.  No adverse effects, no falls, no drowsiness.  I reviewed the database which was appropriate.  Urine toxicology was appropriate earlier this year as well.  We will continue with current plan.  I refilled a 23-monthsupply of oxycodone 10 mg #100.  He is having some opioid related constipation, uses sorbitol on an as-needed basis with good effect.

## 2021-04-24 NOTE — Progress Notes (Signed)
   Assessment and Plan:  See Encounters tab for problem-based medical decision making.   __________________________________________________________  HPI:   65 year old person living with obesity, hypertension, diabetes, and atrial fibrillation here for follow-up of those issues and chronic pain.  Reports doing fairly well over the last few months.  Stable exertional capacity, was able to walk down to our clinic, only had to rest 1 time which is an improvement for him.  Reports good benefit from chronic pain medication, uses it to improve his functional status.  Denies any adverse side effects.  No recent illness.  No recent hospitalizations or ED visits.  Good adherence with his medications, denies any adverse side effects.  No recent changes in his medications.  __________________________________________________________  Problem List: Patient Active Problem List   Diagnosis Date Noted   Chronic use of opiate drug for therapeutic purpose 08/23/2017    Priority: High   Paroxysmal atrial fibrillation ([place]) 10/25/2015    Priority: High   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 ([place]) 04/25/2013    Priority: High   Severe obesity with body mass index (BMI) of 35.0 to 39.9 with comorbidity ([place]) 04/25/2013    Priority: High   Erectile dysfunction 01/23/2021    Priority: Medium   B12 deficiency 10/24/2020    Priority: Medium   Hypomagnesemia 12/28/2016    Priority: Medium   Obstructive sleep apnea 06/01/2013    Priority: Medium   Osteoarthritis cervical spine 04/25/2013    Priority: Medium   Hyperlipidemia 04/25/2013    Priority: Medium   Coronary artery disease involving native coronary artery with angina pectoris ([place]) 04/25/2013    Priority: Medium   COPD (chronic obstructive pulmonary disease) ([place]) 04/25/2013    Priority: Medium   Chronic diastolic heart failure ([place]) 02/04/2012    Priority: Medium   Essential hypertension 09/20/2011     Priority: Medium   Tubular adenoma of colon 11/22/2017    Priority: Low   C6 radiculopathy 01/24/2016    Priority: Low   Constipation due to opioid therapy 12/24/2014    Priority: Low   Post-traumatic osteoarthritis of left knee 06/19/2013    Priority: Low   Gastroesophageal reflux disease without esophagitis 04/25/2013    Priority: Low   Open-angle glaucoma 04/25/2013    Priority: Low   Idiopathic chronic gout without tophus 04/25/2013    Priority: [place] maintenance 01/15/2013    Priority: Low    Medications: Reconciled today in Epic __________________________________________________________  Physical Exam:  Vital Signs: Vitals:   04/24/21 1053  BP: 134/70  Pulse: (!) 50  Temp: 97.8 F (36.6 C)  TempSrc: Oral  SpO2: 97%  Weight: 298 lb 12.8 oz (135.5 kg)  Height: 6' (1.829 m)    Gen: Well appearing, NAD Neck: No cervical LAD, No thyromegaly or nodules, No JVD. CV: RRR, no murmurs Pulm: Normal effort, CTA throughout, no wheezing Ext: Warm, chronic nonpitting edema, left worse than right.

## 2021-05-03 ENCOUNTER — Other Ambulatory Visit: Payer: Self-pay | Admitting: Student in an Organized Health Care Education/Training Program

## 2021-05-03 DIAGNOSIS — I25119 Atherosclerotic heart disease of native coronary artery with unspecified angina pectoris: Secondary | ICD-10-CM

## 2021-05-12 DIAGNOSIS — G4733 Obstructive sleep apnea (adult) (pediatric): Secondary | ICD-10-CM | POA: Diagnosis not present

## 2021-05-24 DIAGNOSIS — G4733 Obstructive sleep apnea (adult) (pediatric): Secondary | ICD-10-CM | POA: Diagnosis not present

## 2021-06-02 ENCOUNTER — Other Ambulatory Visit: Payer: Self-pay | Admitting: Internal Medicine

## 2021-06-02 DIAGNOSIS — E1140 Type 2 diabetes mellitus with diabetic neuropathy, unspecified: Secondary | ICD-10-CM

## 2021-06-02 DIAGNOSIS — I480 Paroxysmal atrial fibrillation: Secondary | ICD-10-CM

## 2021-06-02 DIAGNOSIS — N521 Erectile dysfunction due to diseases classified elsewhere: Secondary | ICD-10-CM

## 2021-06-05 NOTE — Telephone Encounter (Signed)
Next appt scheduled 07/24/21 with PCP.

## 2021-06-11 DIAGNOSIS — G4733 Obstructive sleep apnea (adult) (pediatric): Secondary | ICD-10-CM | POA: Diagnosis not present

## 2021-06-16 ENCOUNTER — Other Ambulatory Visit: Payer: Self-pay | Admitting: Student in an Organized Health Care Education/Training Program

## 2021-06-16 DIAGNOSIS — G609 Hereditary and idiopathic neuropathy, unspecified: Secondary | ICD-10-CM

## 2021-06-23 ENCOUNTER — Ambulatory Visit: Payer: Medicare Other | Admitting: Podiatry

## 2021-07-02 ENCOUNTER — Other Ambulatory Visit: Payer: Self-pay | Admitting: Student in an Organized Health Care Education/Training Program

## 2021-07-07 ENCOUNTER — Other Ambulatory Visit: Payer: Self-pay | Admitting: Internal Medicine

## 2021-07-07 DIAGNOSIS — G609 Hereditary and idiopathic neuropathy, unspecified: Secondary | ICD-10-CM

## 2021-07-17 ENCOUNTER — Encounter: Payer: Self-pay | Admitting: Internal Medicine

## 2021-07-24 ENCOUNTER — Encounter: Payer: Self-pay | Admitting: Student in an Organized Health Care Education/Training Program

## 2021-07-24 ENCOUNTER — Other Ambulatory Visit: Payer: Self-pay | Admitting: Internal Medicine

## 2021-07-24 ENCOUNTER — Ambulatory Visit (INDEPENDENT_AMBULATORY_CARE_PROVIDER_SITE_OTHER): Payer: Medicare Other | Admitting: Student in an Organized Health Care Education/Training Program

## 2021-07-24 VITALS — BP 140/64 | HR 57 | Temp 98.0°F | Ht 72.0 in | Wt 303.9 lb

## 2021-07-24 DIAGNOSIS — Z23 Encounter for immunization: Secondary | ICD-10-CM | POA: Diagnosis not present

## 2021-07-24 DIAGNOSIS — I10 Essential (primary) hypertension: Secondary | ICD-10-CM | POA: Diagnosis not present

## 2021-07-24 DIAGNOSIS — M1A00X0 Idiopathic chronic gout, unspecified site, without tophus (tophi): Secondary | ICD-10-CM

## 2021-07-24 DIAGNOSIS — N521 Erectile dysfunction due to diseases classified elsewhere: Secondary | ICD-10-CM | POA: Diagnosis not present

## 2021-07-24 DIAGNOSIS — E1140 Type 2 diabetes mellitus with diabetic neuropathy, unspecified: Secondary | ICD-10-CM | POA: Diagnosis not present

## 2021-07-24 DIAGNOSIS — E538 Deficiency of other specified B group vitamins: Secondary | ICD-10-CM | POA: Diagnosis not present

## 2021-07-24 DIAGNOSIS — Z79891 Long term (current) use of opiate analgesic: Secondary | ICD-10-CM | POA: Diagnosis not present

## 2021-07-24 DIAGNOSIS — M7582 Other shoulder lesions, left shoulder: Secondary | ICD-10-CM | POA: Diagnosis not present

## 2021-07-24 LAB — POCT GLYCOSYLATED HEMOGLOBIN (HGB A1C): Hemoglobin A1C: 6.5 % — AB (ref 4.0–5.6)

## 2021-07-24 LAB — GLUCOSE, CAPILLARY: Glucose-Capillary: 169 mg/dL — ABNORMAL HIGH (ref 70–99)

## 2021-07-24 MED ORDER — OXYCODONE-ACETAMINOPHEN 10-325 MG PO TABS: 1.0000 | ORAL_TABLET | Freq: Three times a day (TID) | ORAL | 0 refills | Status: DC | PRN

## 2021-07-24 MED ORDER — PANTOPRAZOLE SODIUM 40 MG PO TBEC: 40.0000 mg | DELAYED_RELEASE_TABLET | Freq: Every day | ORAL | 3 refills | Status: DC

## 2021-07-24 NOTE — Progress Notes (Signed)
Assessment and Plan:  See Encounters tab for problem-based medical decision making.   __________________________________________________________  HPI:   65 year old person living with diabetes, hypertension, obesity here for follow-up of these chronic conditions and with acute concern of pain in his left shoulder.  Reports being in relatively stable health, no recent hospitalizations or emergency department visits.  Denies any changes in his medications, no adverse side effects.  Reports stable functional status, still limited by pain in his low back, hips, and knees.  Good adherence with opioids, no falls or other side effects.  Reports 1 month of pain in his left shoulder, bothers him mostly at night, and makes it difficult to raise his hand above his head.  No falls or trauma to the area.  No fevers or chills.  __________________________________________________________  Problem List: Patient Active Problem List   Diagnosis Date Noted   Chronic use of opiate drug for therapeutic purpose 08/23/2017    Priority: High   Paroxysmal atrial fibrillation ([place]) 10/25/2015    Priority: High   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 ([place]) 04/25/2013    Priority: High   Severe obesity with body mass index (BMI) of 35.0 to 39.9 with comorbidity ([place]) 04/25/2013    Priority: High   Erectile dysfunction 01/23/2021    Priority: Medium    B12 deficiency 10/24/2020    Priority: Medium    Hypomagnesemia 12/28/2016    Priority: Medium    Obstructive sleep apnea 06/01/2013    Priority: Medium    Osteoarthritis cervical spine 04/25/2013    Priority: Medium    Hyperlipidemia 04/25/2013    Priority: Medium    Coronary artery disease involving native coronary artery with angina pectoris ([place]) 04/25/2013    Priority: Medium    COPD (chronic obstructive pulmonary disease) ([place]) 04/25/2013    Priority: Medium    Chronic diastolic heart failure ([place]) 02/04/2012     Priority: Medium    Essential hypertension 09/20/2011    Priority: Medium    Tubular adenoma of colon 11/22/2017    Priority: Low   C6 radiculopathy 01/24/2016    Priority: Low   Constipation due to opioid therapy 12/24/2014    Priority: Low   Post-traumatic osteoarthritis of left knee 06/19/2013    Priority: Low   Gastroesophageal reflux disease without esophagitis 04/25/2013    Priority: Low   Open-angle glaucoma 04/25/2013    Priority: Low   Idiopathic chronic gout without tophus 04/25/2013    Priority: [place] maintenance 01/15/2013    Priority: Low    Medications: Reconciled today in Epic __________________________________________________________  Physical Exam:  Vital Signs: Vitals:   07/24/21 0943  BP: 140/64  Pulse: (!) 57  Temp: 98 F (36.7 C)  TempSrc: Oral  SpO2: 97%  Weight: (!) 303 lb 14.4 oz (137.8 kg)  Height: 6' (1.829 m)    Gen: Well appearing, NAD Neck: No cervical LAD, No thyromegaly or nodules CV: Distant heart sounds, RRR, no murmurs Pulm: Normal effort, CTA throughout, no wheezing Ext: Warm, 1+ chronic nonpitting lower extremity edema with the left a little worse than the right.  On inspection the left shoulder appears normal, no joint effusion or deformity.  Good passive range of motion without much tenderness.  He has full strength with abduction, external and internal rotation, most significant pain with abduction on empty can test.  He can abduct his arm about 60 degrees.  Good range of motion of the neck with no tenderness on  [place].

## 2021-07-24 NOTE — Assessment & Plan Note (Signed)
Blood pressure well controlled today, no orthostatic symptoms.  Plan to continue with losartan 50 mg daily.  Will check BMP today.

## 2021-07-24 NOTE — Addendum Note (Signed)
Addended by: Silverio Decamp C on: 07/24/2021 02:58 PM   Modules accepted: Orders

## 2021-07-24 NOTE — Assessment & Plan Note (Signed)
Continues to have excellent control, A1c 6.5% today.  No significant complications.  Plan to check urine microalbumin today.  Continue with metformin 1000 mg twice daily.  Continue losartan for renal protection.

## 2021-07-24 NOTE — Assessment & Plan Note (Addendum)
Symptoms and exam are most consistent with rotator cuff tendinitis on the left side.  No signs of joint effusion, low suspicion for osteoarthritis.  Pretty good abduction around 60 degrees, symptoms do not seem to be function limiting at this time.  Will need to avoid NSAIDs due to CKD 3b status and other comorbidities. He has had benefit from steroid injections to his knees and hips in the past. Diabetes is well controlled, so can tolerate low dose steroid injection to the subacromial space. I offered it in our clinic, he would prefer to use his orthopedist at [facility]. I will place a referral given this is a new problem.

## 2021-07-24 NOTE — Assessment & Plan Note (Signed)
Chronic pain generators include osteoarthritis of his lumbar spine, bilateral hips, and bilateral knees.  Pain is function limiting, he benefits from the use of chronic opioids to improve functional status.  Denies any recent falls, no other side effects.  Reviewed the database which shows appropriate usage.  Last urine tox assure earlier this year was appropriate.  We will continue with oxycodone 10 mg, prescribed #100 for 1 month supply, I sent 3 months of prescriptions to his pharmacy.  Follow-up with me in 3 months.

## 2021-07-25 LAB — URIC ACID: Uric Acid: 5 mg/dL (ref 3.8–8.4)

## 2021-07-25 LAB — BMP8+ANION GAP
Anion Gap: 13 mmol/L (ref 10.0–18.0)
BUN/Creatinine Ratio: 13 (ref 10–24)
BUN: 23 mg/dL (ref 8–27)
CO2: 28 mmol/L (ref 20–29)
Calcium: 9.3 mg/dL (ref 8.6–10.2)
Chloride: 98 mmol/L (ref 96–106)
Creatinine, Ser: 1.73 mg/dL — ABNORMAL HIGH (ref 0.76–1.27)
Glucose: 144 mg/dL — ABNORMAL HIGH (ref 70–99)
Potassium: 4.1 mmol/L (ref 3.5–5.2)
Sodium: 139 mmol/L (ref 134–144)
eGFR: 43 mL/min/{1.73_m2} — ABNORMAL LOW (ref >59)

## 2021-07-25 LAB — MICROALBUMIN / CREATININE URINE RATIO
Creatinine, Urine: 188.6 mg/dL
Microalb/Creat Ratio: 3 mg/g creat (ref 0–29)
Microalbumin, Urine: 6.3 ug/mL

## 2021-07-25 LAB — VITAMIN B12: Vitamin B-12: >2000 pg/mL — ABNORMAL HIGH (ref 232–1245)

## 2021-07-25 LAB — MAGNESIUM: Magnesium: 1 mg/dL — ABNORMAL LOW (ref 1.6–2.3)

## 2021-07-25 NOTE — Addendum Note (Signed)
Addended by: Lalla Brothers T on: 07/25/2021 10:24 AM   Modules accepted: Orders

## 2021-08-16 DIAGNOSIS — H04123 Dry eye syndrome of bilateral lacrimal glands: Secondary | ICD-10-CM | POA: Diagnosis not present

## 2021-08-16 DIAGNOSIS — H26493 Other secondary cataract, bilateral: Secondary | ICD-10-CM | POA: Diagnosis not present

## 2021-08-16 DIAGNOSIS — G4730 Sleep apnea, unspecified: Secondary | ICD-10-CM | POA: Diagnosis not present

## 2021-08-16 DIAGNOSIS — E119 Type 2 diabetes mellitus without complications: Secondary | ICD-10-CM | POA: Diagnosis not present

## 2021-08-16 DIAGNOSIS — Z961 Presence of intraocular lens: Secondary | ICD-10-CM | POA: Diagnosis not present

## 2021-08-16 DIAGNOSIS — H40013 Open angle with borderline findings, low risk, bilateral: Secondary | ICD-10-CM | POA: Diagnosis not present

## 2021-08-16 DIAGNOSIS — H1045 Other chronic allergic conjunctivitis: Secondary | ICD-10-CM | POA: Diagnosis not present

## 2021-08-16 LAB — HM DIABETES EYE EXAM

## 2021-08-22 DIAGNOSIS — G4733 Obstructive sleep apnea (adult) (pediatric): Secondary | ICD-10-CM | POA: Diagnosis not present

## 2021-09-07 ENCOUNTER — Encounter: Payer: Self-pay | Admitting: Dietician

## 2021-09-19 DIAGNOSIS — M25512 Pain in left shoulder: Secondary | ICD-10-CM | POA: Diagnosis not present

## 2021-09-19 DIAGNOSIS — M170 Bilateral primary osteoarthritis of knee: Secondary | ICD-10-CM | POA: Diagnosis not present

## 2021-09-22 ENCOUNTER — Other Ambulatory Visit: Payer: Self-pay | Admitting: Student in an Organized Health Care Education/Training Program

## 2021-09-22 DIAGNOSIS — Z79891 Long term (current) use of opiate analgesic: Secondary | ICD-10-CM

## 2021-09-22 MED ORDER — OXYCODONE-ACETAMINOPHEN 10-325 MG PO TABS: 1.0000 | ORAL_TABLET | Freq: Three times a day (TID) | ORAL | 0 refills | Status: DC | PRN

## 2021-09-22 NOTE — Telephone Encounter (Signed)
Refill Request   oxyCODONE-acetaminophen (PERCOCET) 10-325 MG tablet   [id number] [address], [place] (Ph: [phone])

## 2021-09-28 ENCOUNTER — Other Ambulatory Visit: Payer: Self-pay | Admitting: Student in an Organized Health Care Education/Training Program

## 2021-09-28 ENCOUNTER — Other Ambulatory Visit: Payer: Self-pay | Admitting: Critical Care Medicine

## 2021-09-28 DIAGNOSIS — J449 Chronic obstructive pulmonary disease, unspecified: Secondary | ICD-10-CM

## 2021-10-05 ENCOUNTER — Encounter: Payer: Self-pay | Admitting: Internal Medicine

## 2021-10-05 ENCOUNTER — Ambulatory Visit (INDEPENDENT_AMBULATORY_CARE_PROVIDER_SITE_OTHER): Payer: Medicare Other | Admitting: Internal Medicine

## 2021-10-05 VITALS — BP 126/82 | HR 91 | Ht 72.0 in | Wt 305.8 lb

## 2021-10-05 DIAGNOSIS — K219 Gastro-esophageal reflux disease without esophagitis: Secondary | ICD-10-CM

## 2021-10-05 DIAGNOSIS — Z86010 Personal history of colonic polyps: Secondary | ICD-10-CM | POA: Diagnosis not present

## 2021-10-05 DIAGNOSIS — Z7901 Long term (current) use of anticoagulants: Secondary | ICD-10-CM

## 2021-10-05 MED ORDER — PLENVU 140 G PO SOLR: 1.0000 | Freq: Once | ORAL | 0 refills | Status: AC

## 2021-10-05 NOTE — Patient Instructions (Signed)
If you are age 68 or older, your body mass index should be between 23-30. Your Body mass index is 41.47 kg/m. If this is out of the aforementioned range listed, please consider follow up with your Primary Care Provider.  If you are age 12 or younger, your body mass index should be between 19-25. Your Body mass index is 41.47 kg/m. If this is out of the aformentioned range listed, please consider follow up with your Primary Care Provider.   ________________________________________________________  The [facility] providers would like to encourage you to use [facility] to communicate with providers for non-urgent requests or questions.  Due to long hold times on the telephone, sending your provider a message by [facility] may be a faster and more efficient way to get a response.  Please allow 48 business hours for a response.  Please remember that this is for non-urgent requests.  _______________________________________________________  Juan Calderon have been scheduled for a colonoscopy. Please follow written instructions given to you at your visit today.  Please pick up your prep supplies at the pharmacy within the next 1-3 days. If you use inhalers (even only as needed), please bring them with you on the day of your procedure.
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
## 2021-10-10 DIAGNOSIS — M25512 Pain in left shoulder: Secondary | ICD-10-CM | POA: Diagnosis not present

## 2021-10-12 DIAGNOSIS — M25512 Pain in left shoulder: Secondary | ICD-10-CM | POA: Diagnosis not present

## 2021-10-13 ENCOUNTER — Other Ambulatory Visit: Payer: Self-pay | Admitting: Student in an Organized Health Care Education/Training Program

## 2021-10-13 ENCOUNTER — Telehealth: Payer: Self-pay | Admitting: *Deleted

## 2021-10-13 DIAGNOSIS — G609 Hereditary and idiopathic neuropathy, unspecified: Secondary | ICD-10-CM

## 2021-10-13 NOTE — Telephone Encounter (Signed)
° °  Pre-operative Risk Assessment    Patient Name: Juan Calderon  DOB: 1956-06-16 MRN: [id number]      Request for Surgical Clearance    Procedure:   LEFT SHOULDER SCOPE, ROTATOR CUFF REPAIR  Date of Surgery:  Clearance TBD                                 Surgeon:  DR. Ophelia Charter Surgeon's Group or Practice Name:  [facility] Phone number:  [phone] Fax number:  [phone] ATTN: Derek Jack   Type of Clearance Requested:   - Medical  - Pharmacy:  Hold Apixaban (Eliquis)     Type of Anesthesia:   CHOICE   Additional requests/questions:    Jiles Prows   10/13/2021, 5:29 PM

## 2021-10-15 NOTE — Telephone Encounter (Signed)
Clinical pharmacist to review Eliquis 

## 2021-10-15 NOTE — Telephone Encounter (Signed)
° °  Name: [name]  DOB: [birth year]  MRN: [id number]  Primary Cardiologist: Lauree Chandler, MD  Chart reviewed as part of pre-operative protocol coverage. Because of Juan Calderon's past medical history and time since last visit, he will require a follow-up visit in order to better assess preoperative cardiovascular risk.  Pre-op covering staff: - Please schedule appointment and call patient to inform them. If patient already had an upcoming appointment within acceptable timeframe, please add "pre-op clearance" to the appointment notes so provider is aware. - Please contact requesting surgeon's office via preferred method (i.e, phone, fax) to inform them of need for appointment prior to surgery.  If applicable, this message will also be routed to pharmacy pool and/or primary cardiologist for input on holding anticoagulant/antiplatelet agent as requested below so that this information is available to the clearing provider at time of patient's appointment.   [place], [place]  10/15/2021, 4:29 PM

## 2021-10-16 DIAGNOSIS — Z79899 Other long term (current) drug therapy: Secondary | ICD-10-CM | POA: Diagnosis not present

## 2021-10-16 DIAGNOSIS — N1832 Chronic kidney disease, stage 3b: Secondary | ICD-10-CM | POA: Insufficient documentation

## 2021-10-16 DIAGNOSIS — R0789 Other chest pain: Principal | ICD-10-CM | POA: Insufficient documentation

## 2021-10-16 DIAGNOSIS — I25119 Atherosclerotic heart disease of native coronary artery with unspecified angina pectoris: Secondary | ICD-10-CM | POA: Diagnosis not present

## 2021-10-16 DIAGNOSIS — E1140 Type 2 diabetes mellitus with diabetic neuropathy, unspecified: Secondary | ICD-10-CM | POA: Diagnosis not present

## 2021-10-16 DIAGNOSIS — Z20822 Contact with and (suspected) exposure to covid-19: Secondary | ICD-10-CM | POA: Diagnosis not present

## 2021-10-16 DIAGNOSIS — I517 Cardiomegaly: Secondary | ICD-10-CM | POA: Diagnosis not present

## 2021-10-16 DIAGNOSIS — I130 Hypertensive heart and chronic kidney disease with heart failure and stage 1 through stage 4 chronic kidney disease, or unspecified chronic kidney disease: Secondary | ICD-10-CM | POA: Insufficient documentation

## 2021-10-16 DIAGNOSIS — J449 Chronic obstructive pulmonary disease, unspecified: Secondary | ICD-10-CM | POA: Insufficient documentation

## 2021-10-16 DIAGNOSIS — R059 Cough, unspecified: Secondary | ICD-10-CM | POA: Diagnosis not present

## 2021-10-16 DIAGNOSIS — Z7984 Long term (current) use of oral hypoglycemic drugs: Secondary | ICD-10-CM | POA: Diagnosis not present

## 2021-10-16 DIAGNOSIS — I480 Paroxysmal atrial fibrillation: Secondary | ICD-10-CM | POA: Insufficient documentation

## 2021-10-16 DIAGNOSIS — Z85038 Personal history of other malignant neoplasm of large intestine: Secondary | ICD-10-CM | POA: Insufficient documentation

## 2021-10-16 DIAGNOSIS — I5032 Chronic diastolic (congestive) heart failure: Secondary | ICD-10-CM | POA: Insufficient documentation

## 2021-10-16 DIAGNOSIS — Z7901 Long term (current) use of anticoagulants: Secondary | ICD-10-CM | POA: Insufficient documentation

## 2021-10-16 DIAGNOSIS — E039 Hypothyroidism, unspecified: Secondary | ICD-10-CM | POA: Insufficient documentation

## 2021-10-16 DIAGNOSIS — R079 Chest pain, unspecified: Secondary | ICD-10-CM | POA: Diagnosis not present

## 2021-10-16 NOTE — Telephone Encounter (Signed)
I have tried to reach the pt to schedule a sooner appt for pre op clearance, though I am unable to get through to pt's phone. Pt also needs lab work for pre op clearance. I will send notes to requesting office in hopes that they may s/w the pt and let him know he needs to call his cardiologist office.

## 2021-10-16 NOTE — Telephone Encounter (Signed)
Patient with diagnosis of atrial fibrillation on Eliquis for anticoagulation.    Procedure: left shoulder scope, rotator cuff repair Date of procedure: TBD   CHA2DS2-VASc Score = 5   This indicates a 7.2% annual risk of stroke. The patient's score is based upon: CHF History: 1 HTN History: 1 Diabetes History: 1 Stroke History: 0 Vascular Disease History: 1 Age Score: 1 Gender Score: 0   CrCl 60 Platelet count NEEDS CBC DRAW  Patient last CBC 2021.  Will need updated platelet count for final approval.

## 2021-10-17 ENCOUNTER — Encounter (HOSPITAL_COMMUNITY): Payer: Self-pay | Admitting: *Deleted

## 2021-10-17 ENCOUNTER — Observation Stay (HOSPITAL_BASED_OUTPATIENT_CLINIC_OR_DEPARTMENT_OTHER): Payer: Medicare Other

## 2021-10-17 ENCOUNTER — Emergency Department (HOSPITAL_COMMUNITY): Payer: Medicare Other

## 2021-10-17 ENCOUNTER — Other Ambulatory Visit: Payer: Self-pay

## 2021-10-17 ENCOUNTER — Observation Stay (HOSPITAL_COMMUNITY)
Admission: EM | Admit: 2021-10-17 | Discharge: 2021-10-18 | Disposition: A | Discharge status: Home / Self Care | Payer: Medicare Other | Attending: Internal Medicine | Admitting: Internal Medicine

## 2021-10-17 DIAGNOSIS — I10 Essential (primary) hypertension: Secondary | ICD-10-CM | POA: Diagnosis present

## 2021-10-17 DIAGNOSIS — I5030 Unspecified diastolic (congestive) heart failure: Secondary | ICD-10-CM | POA: Diagnosis present

## 2021-10-17 DIAGNOSIS — N521 Erectile dysfunction due to diseases classified elsewhere: Secondary | ICD-10-CM | POA: Diagnosis present

## 2021-10-17 DIAGNOSIS — I25119 Atherosclerotic heart disease of native coronary artery with unspecified angina pectoris: Secondary | ICD-10-CM | POA: Diagnosis present

## 2021-10-17 DIAGNOSIS — R072 Precordial pain: Secondary | ICD-10-CM | POA: Diagnosis not present

## 2021-10-17 DIAGNOSIS — D696 Thrombocytopenia, unspecified: Secondary | ICD-10-CM | POA: Diagnosis not present

## 2021-10-17 DIAGNOSIS — R059 Cough, unspecified: Secondary | ICD-10-CM | POA: Diagnosis not present

## 2021-10-17 DIAGNOSIS — E785 Hyperlipidemia, unspecified: Secondary | ICD-10-CM | POA: Diagnosis present

## 2021-10-17 DIAGNOSIS — I517 Cardiomegaly: Secondary | ICD-10-CM | POA: Diagnosis not present

## 2021-10-17 DIAGNOSIS — R0789 Other chest pain: Secondary | ICD-10-CM | POA: Diagnosis not present

## 2021-10-17 DIAGNOSIS — R079 Chest pain, unspecified: Secondary | ICD-10-CM | POA: Diagnosis present

## 2021-10-17 DIAGNOSIS — I5032 Chronic diastolic (congestive) heart failure: Secondary | ICD-10-CM | POA: Diagnosis present

## 2021-10-17 DIAGNOSIS — N183 Chronic kidney disease, stage 3 unspecified: Secondary | ICD-10-CM

## 2021-10-17 DIAGNOSIS — K219 Gastro-esophageal reflux disease without esophagitis: Secondary | ICD-10-CM | POA: Diagnosis present

## 2021-10-17 DIAGNOSIS — E1140 Type 2 diabetes mellitus with diabetic neuropathy, unspecified: Secondary | ICD-10-CM | POA: Diagnosis present

## 2021-10-17 LAB — ECHOCARDIOGRAM COMPLETE
Area-P 1/2: 3.03 cm2
Calc EF: 54.8 %
Height: 72 in
S' Lateral: 2.9 cm
Single Plane A2C EF: 58.5 %
Single Plane A4C EF: 53.2 %
Weight: 4880 oz

## 2021-10-17 LAB — CBC
HCT: 40.2 % (ref 39.0–52.0)
Hemoglobin: 12.8 g/dL — ABNORMAL LOW (ref 13.0–17.0)
MCH: 31.8 pg (ref 26.0–34.0)
MCHC: 31.8 g/dL (ref 30.0–36.0)
MCV: 99.8 fL (ref 80.0–100.0)
Platelets: 121 10*3/uL — ABNORMAL LOW (ref 150–400)
RBC: 4.03 MIL/uL — ABNORMAL LOW (ref 4.22–5.81)
RDW: 14.3 % (ref 11.5–15.5)
WBC: 7.9 10*3/uL (ref 4.0–10.5)
nRBC: 0 % (ref 0.0–0.2)

## 2021-10-17 LAB — RESP PANEL BY RT-PCR (FLU A&B, COVID) ARPGX2
Influenza A by PCR: NEGATIVE
Influenza B by PCR: NEGATIVE
SARS Coronavirus 2 by RT PCR: NEGATIVE

## 2021-10-17 LAB — BASIC METABOLIC PANEL
Anion gap: 10 (ref 5–15)
BUN: 23 mg/dL (ref 8–23)
CO2: 26 mmol/L (ref 22–32)
Calcium: 9 mg/dL (ref 8.9–10.3)
Chloride: 102 mmol/L (ref 98–111)
Creatinine, Ser: 1.84 mg/dL — ABNORMAL HIGH (ref 0.61–1.24)
GFR, Estimated: 40 mL/min — ABNORMAL LOW (ref >60)
Glucose, Bld: 156 mg/dL — ABNORMAL HIGH (ref 70–99)
Potassium: 4 mmol/L (ref 3.5–5.1)
Sodium: 138 mmol/L (ref 135–145)

## 2021-10-17 LAB — TROPONIN I (HIGH SENSITIVITY)
Troponin I (High Sensitivity): 31 ng/L — ABNORMAL HIGH (ref <18)
Troponin I (High Sensitivity): 33 ng/L — ABNORMAL HIGH (ref <18)
Troponin I (High Sensitivity): 30 ng/L — ABNORMAL HIGH (ref <18)

## 2021-10-17 LAB — MAGNESIUM: Magnesium: 1.1 mg/dL — ABNORMAL LOW (ref 1.7–2.4)

## 2021-10-17 MED ORDER — ALUM & MAG HYDROXIDE-SIMETH 200-200-20 MG/5ML PO SUSP
30.0000 mL | Freq: Once | ORAL | Status: AC
  Administered 2021-10-17: 30 mL via ORAL
  Filled 2021-10-17: qty 30

## 2021-10-17 MED ORDER — IPRATROPIUM-ALBUTEROL 0.5-2.5 (3) MG/3ML IN SOLN: 3.0000 mL | RESPIRATORY_TRACT | Status: DC | PRN

## 2021-10-17 MED ORDER — DICLOFENAC SODIUM 1 % EX GEL
2.0000 g | Freq: Four times a day (QID) | CUTANEOUS | Status: DC
  Administered 2021-10-17 (×2): 2 g via TOPICAL
  Filled 2021-10-17: qty 100

## 2021-10-17 MED ORDER — LOSARTAN POTASSIUM 50 MG PO TABS
50.0000 mg | ORAL_TABLET | Freq: Every day | ORAL | Status: DC
  Administered 2021-10-17 – 2021-10-18 (×2): 50 mg via ORAL
  Filled 2021-10-17 (×2): qty 1

## 2021-10-17 MED ORDER — ALLOPURINOL 300 MG PO TABS
300.0000 mg | ORAL_TABLET | Freq: Every day | ORAL | Status: DC
  Administered 2021-10-17 – 2021-10-18 (×2): 300 mg via ORAL
  Filled 2021-10-17 (×2): qty 1

## 2021-10-17 MED ORDER — PANTOPRAZOLE SODIUM 40 MG PO TBEC
40.0000 mg | DELAYED_RELEASE_TABLET | Freq: Every day | ORAL | Status: DC
  Administered 2021-10-17 – 2021-10-18 (×2): 40 mg via ORAL
  Filled 2021-10-17 (×2): qty 1

## 2021-10-17 MED ORDER — LIDOCAINE 5 % EX PTCH
1.0000 | MEDICATED_PATCH | CUTANEOUS | Status: DC
  Administered 2021-10-17 – 2021-10-18 (×2): 1 via TRANSDERMAL
  Filled 2021-10-17 (×2): qty 1

## 2021-10-17 MED ORDER — ACETAMINOPHEN 500 MG PO TABS
1000.0000 mg | ORAL_TABLET | Freq: Three times a day (TID) | ORAL | Status: DC
  Administered 2021-10-17 – 2021-10-18 (×4): 1000 mg via ORAL
  Filled 2021-10-17 (×4): qty 2

## 2021-10-17 MED ORDER — ALBUTEROL SULFATE HFA 108 (90 BASE) MCG/ACT IN AERS
1.0000 | INHALATION_SPRAY | Freq: Four times a day (QID) | RESPIRATORY_TRACT | Status: DC | PRN
  Filled 2021-10-17: qty 6.7

## 2021-10-17 MED ORDER — FLECAINIDE ACETATE 100 MG PO TABS
100.0000 mg | ORAL_TABLET | Freq: Two times a day (BID) | ORAL | Status: DC
  Administered 2021-10-17 – 2021-10-18 (×2): 100 mg via ORAL
  Filled 2021-10-17 (×3): qty 1

## 2021-10-17 MED ORDER — METOPROLOL TARTRATE 25 MG PO TABS: 25.0000 mg | ORAL_TABLET | Freq: Two times a day (BID) | ORAL | Status: DC

## 2021-10-17 MED ORDER — METFORMIN HCL 500 MG PO TABS
1000.0000 mg | ORAL_TABLET | Freq: Two times a day (BID) | ORAL | Status: DC
  Administered 2021-10-17 – 2021-10-18 (×3): 1000 mg via ORAL
  Filled 2021-10-17 (×3): qty 2

## 2021-10-17 MED ORDER — APIXABAN 5 MG PO TABS
5.0000 mg | ORAL_TABLET | Freq: Two times a day (BID) | ORAL | Status: DC
  Administered 2021-10-17 – 2021-10-18 (×3): 5 mg via ORAL
  Filled 2021-10-17 (×3): qty 1

## 2021-10-17 MED ORDER — METOPROLOL TARTRATE 25 MG PO TABS
25.0000 mg | ORAL_TABLET | Freq: Two times a day (BID) | ORAL | Status: DC
  Administered 2021-10-17 – 2021-10-18 (×2): 25 mg via ORAL
  Filled 2021-10-17 (×2): qty 1

## 2021-10-17 MED ORDER — TERAZOSIN HCL 5 MG PO CAPS
5.0000 mg | ORAL_CAPSULE | Freq: Every day | ORAL | Status: DC
  Administered 2021-10-17: 5 mg via ORAL
  Filled 2021-10-17 (×2): qty 1

## 2021-10-17 MED ORDER — MAGNESIUM SULFATE 2 GM/50ML IV SOLN
2.0000 g | Freq: Once | INTRAVENOUS | Status: AC
  Administered 2021-10-17: 2 g via INTRAVENOUS
  Filled 2021-10-17: qty 50

## 2021-10-17 MED ORDER — OXYCODONE-ACETAMINOPHEN 5-325 MG PO TABS: 2.0000 | ORAL_TABLET | Freq: Three times a day (TID) | ORAL | Status: DC | PRN

## 2021-10-17 MED ORDER — NITROGLYCERIN 0.4 MG SL SUBL
0.4000 mg | SUBLINGUAL_TABLET | SUBLINGUAL | Status: DC | PRN
  Administered 2021-10-17 (×2): 0.4 mg via SUBLINGUAL
  Filled 2021-10-17: qty 1

## 2021-10-17 MED ORDER — METOPROLOL TARTRATE 25 MG PO TABS: 50.0000 mg | ORAL_TABLET | Freq: Two times a day (BID) | ORAL | Status: DC

## 2021-10-17 MED ORDER — TORSEMIDE 20 MG PO TABS
20.0000 mg | ORAL_TABLET | Freq: Every day | ORAL | Status: DC
  Administered 2021-10-17 – 2021-10-18 (×2): 20 mg via ORAL
  Filled 2021-10-17 (×2): qty 1

## 2021-10-17 MED ORDER — FLECAINIDE ACETATE 100 MG PO TABS
100.0000 mg | ORAL_TABLET | Freq: Two times a day (BID) | ORAL | Status: DC
  Filled 2021-10-17: qty 1

## 2021-10-17 MED ORDER — ATORVASTATIN CALCIUM 10 MG PO TABS
20.0000 mg | ORAL_TABLET | Freq: Every day | ORAL | Status: DC
  Administered 2021-10-17 – 2021-10-18 (×2): 20 mg via ORAL
  Filled 2021-10-17 (×2): qty 2

## 2021-10-17 NOTE — Progress Notes (Signed)
°  Echocardiogram 2D Echocardiogram has been performed.  Fidel Levy 10/17/2021, 11:56 AM

## 2021-10-17 NOTE — H&P (Addendum)
Date: 10/17/2021               Patient Name:  Juan Calderon MRN: [id number]  DOB: [birth year] Age / Sex: 66 y.o., male   PCP: Axel Filler, MD         Medical Service: Internal Medicine Teaching Service         Attending Physician: Dr. Jimmye Norman, Elaina Pattee, MD    First Contact: Dr. Posey Pronto Pager: [phone]  Second Contact: Dr. Eulas Post Pager: [phone]       After Hours (After 5p/  First Contact Pager: [phone]  weekends / holidays): Second Contact Pager: [phone]   Chief Complaint: chest pain  History of Present Illness:  Juan Calderon 66 year old African-American male who follows with [facility], Dr. Evette Doffing, with past medical history of obesity, CAD, chronic diastolic heart failure, hypertension, hyperlipidemia, GERD, bradycardia, frequent PVCs, NSVT, paroxysmal atrial fibrillation, OSA, chronic osteomyelitis of LLE, chronic pain on opioid medication, gout, who presents to [facility] ED for chest pain.  Patient presents with his daughter who is also at bedside.  History provided by patient.  He reports that 11 PM last night he had laid down in bed and rolled over and experienced acute onset dull left-sided chest pain that then radiated across his chest and has been persistent since.  He reports he gets this chest pain usually when he is in A-fib with associated sweating and dizziness.  Denies chest pain on exertion.  Currently he still endorses this chest pain unchanged from earlier.  Currently, he denies worsening shortness of breath from baseline, sweating, lightheadedness, palpitations, swelling of the lower extremities, nausea, vomiting, abdominal pain.  Endorses mild SOB on exertion at baseline and orthopnea which he reports has been a problem for a long time.  Denies recent falls. Patient is not sure which medications he takes for A-fib though reports he has been adherent to his pill pack which he receives in the mail and has not had any missed doses.  Denies heavy dinner, reports ate  peanut butter with crackers.  Reports nothing has improved his chest pain, reports Maalox received in the ED did not improve chest pain.  ED: On arrival, afebrile, bradycardic to 59, initially hypertensive 403K systolic now down to 742V systolic, otherwise hemodynamically stable satting well on room air.  EKG with bradycardia HR 60, regular rhythm low voltage EKG.  Troponins flat, 31 then 33.  Patient received Maalox in the ED and no other medications.  Medicine was called for admission.   Meds:  Current Meds  Medication Sig   albuterol (VENTOLIN HFA) 108 (90 Base) MCG/ACT inhaler INHALE 1-2 PUFFS INTO THE LUNGS EVERY 6 (SIX) HOURS AS NEEDED FOR SHORTNESS OF BREATH.   allopurinol (ZYLOPRIM) 300 MG tablet TAKE 1 TABLET BY MOUTH EVERY DAY (Patient taking differently: Take 300 mg by mouth daily.)   atorvastatin (LIPITOR) 20 MG tablet TAKE 1 TABLET BY MOUTH EVERY DAY (Patient taking differently: Take 20 mg by mouth daily.)   Calcium Citrate-Vitamin D 315-5 MG-MCG TABS Take 2 tablets by mouth daily.   ELIQUIS 5 MG TABS tablet TAKE 1 TABLET BY MOUTH TWICE A DAY (Patient taking differently: Take 5 mg by mouth 2 (two) times daily.)   flecainide (TAMBOCOR) 100 MG tablet TAKE 1 TABLET BY MOUTH EVERY 12 HOURS (Patient taking differently: Take 100 mg by mouth 2 (two) times daily.)   losartan (COZAAR) 50 MG tablet Take 1 tablet (50 mg total) by mouth daily.  magnesium oxide (MAG-OX) 400 MG tablet Take 1 tablet (400 mg total) by mouth 2 (two) times daily.   metFORMIN (GLUCOPHAGE) 1000 MG tablet TAKE 1 TABLET (1,000 MG TOTAL) BY MOUTH 2 (TWO) TIMES DAILY WITH A MEAL.   metoprolol tartrate (LOPRESSOR) 25 MG tablet TAKE 1 TABLET BY MOUTH TWICE A DAY (Patient taking differently: Take 25 mg by mouth 2 (two) times daily.)   oxyCODONE-acetaminophen (PERCOCET) 10-325 MG tablet Take 1 tablet by mouth every 8 (eight) hours as needed for pain.   pantoprazole (PROTONIX) 40 MG tablet Take 1 tablet (40 mg total) by mouth  daily.   Sorbitol 70 % SOLN Take 15-60 mLs by mouth daily as needed (for constipation).   terazosin (HYTRIN) 5 MG capsule TAKE 1 CAPSULE (5 MG TOTAL) BY MOUTH AT BEDTIME.   torsemide (DEMADEX) 20 MG tablet TAKE 1 TABLET BY MOUTH EVERY DAY (Patient taking differently: Take 20 mg by mouth daily.)  Unable to confirm medications with patient since he does not know the names of his medications.  Allergies: Allergies as of 10/16/2021 - Review Complete 10/05/2021  Allergen Reaction Noted   Ramipril Swelling 09/18/2011   Tape Other (See Comments) 12/03/2018   Testosterone Rash 12/04/2011   Past Medical History:  Diagnosis Date   Alcohol abuse     Bradycardia    C6 radiculopathy 01/24/2016   Right upper extremity, mild to moderate electrically by EMG on 01/24/2016   Cataract    Left eye   Chronic diastolic heart failure ([place])     with mild left ventricular hypertrophy on Echo 02/2010   Chronic obstructive pulmonary disease (HCC)     Chronic osteomyelitis of femur ([place]) 04/06/2016   Chronic osteomyelitis of left femur ([place]) 11/22/2017   Left femur s/p prior trauma   Chronic osteomyelitis of left femur ([place]) 11/22/2017   Brodie's abscess: left femur s/p prior trauma.  Underwent partial excision and curettage of left femoral osteomyelitis at [facility] 12/30/2017 with grossly purulent material encountered within the medullary canal of the left distal femur.  Cultures grew MSSA.  Post-operatively received 6 weeks of IV antibiotics through 02/10/2018.  CRP elevated at 60.3 at end of IV antibiotic course so continued on Keflex   Chronic pain syndrome     Left arm and leg s/p traumatic injury    Chronic renal insufficiency     Coronary artery disease     25% LAD stenosis on cath 2007.  Stable angina.   Diverticulosis     Diverticulosis 11/12/2013   Essential hypertension     Frequent PVCs    Gastroesophageal reflux disease     Gout     Hyperlipidemia LDL goal < 100     Internal hemorrhoids without  complication 66/59/9357   Long-term current use of opiate analgesic 09/07/2016   Mild carpal tunnel syndrome of right wrist 01/24/2016   Mild degree electrically per EMG 01/24/2016    Mild carpal tunnel syndrome of right wrist 01/24/2016   Mild degree electrically per EMG 01/24/2016    Morbid obesity with BMI of 40.0-44.9, adult (HCC)     Normocytic anemia     NSVT (nonsustained ventricular tachycardia)    Obstructive sleep apnea     Moderate, AHI 29.8 per hour with moderately loud snoring and oxygen desaturation to a nadir of 79%. CPAP titration resulted in a prescription for 17 CWP.     Open-angle glaucoma     Osteoarthritis cervical spine     Osteoarthritis of left knee 06/19/2013  Tricompartmental disease.  Treated with double hinged upright knee brace, steroid/xylocaine knee injections, and NSAIDs    Osteoporosis 05/14/2017   s/p fracture of the right humerus from a fall at ground hight   Persistent atrial fibrillation (HCC)    Right rotator cuff tear     Large full-thickness tear of the supraspinatus with mild retraction but no atrophy    Right rotator cuff tear 04/25/2013   Large full-thickness tear of the supraspinatus with mild retraction but no atrophy     Secondary male hypogonadism 02/07/2017   Likely secondary to chronic opioid use   Secondary male hypogonadism 02/07/2017   Likely secondary to chronic opioid use   Subclinical hypothyroidism     Tubular adenoma of colon 11/22/2017   Specifics unknown.  Repeat colonoscopy 08/12/2018 with six 3-6 mm tubular adenomas removed endoscopically.   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 (HCC)     Vasomotor rhinitis 04/25/2013    Family History:  Family History  Problem Relation Age of Onset   Heart failure Mother    Asthma Mother    Alzheimer's disease Father    Arthritis Son    Hypertension Sister    Hypertension Sister    Hypertension Sister    Cancer Brother    Cancer Brother        unknown    Early death Brother        Gun Shot Wound   Cancer Brother        prostate   Prostate cancer Brother    Heart attack Neg Hx    Stroke Neg Hx    Colon cancer Neg Hx    Esophageal cancer Neg Hx    Pancreatic cancer Neg Hx    Stomach cancer Neg Hx    Liver disease Neg Hx    Social History: Patient currently lives with his daughter in [place].  Manages his own medications, uses pillbox through delivery system.  Unemployed.  Occasionally ambulates with a walker, suspect patient has relatively sedentary lifestyle.  Denies tobacco use, denies current alcohol use, has remote history of alcohol abuse "way back", denies other illicit drug use.  PCP Dr. Evette Doffing.  Review of Systems: A complete ROS was negative except as per HPI.   Physical Exam: Blood pressure (!) 145/78, pulse (!) 59, temperature 98 F (36.7 C), resp. rate 17, SpO2 98 %. Physical Exam: General: Well appearing obese African-American male, NAD HENT: normocephalic, atraumatic EYES: conjunctiva non-erythematous, no scleral icterus CV: Bradycardia, normal rhythm, no murmurs, rubs, gallops.  Chest pain not reproducible on palpation of chest wall.  Trace lower extremity edema bilaterally.  Distal lower extremities warm and appear well-perfused, 2+ pedal pulse RLE, unable to palpate pedal pulse LLE  Pulmonary: normal work of breathing on RA, lungs clear to auscultation, no rales, wheezes, rhonchi, transmitted upper airway sounds Abdominal: non-distended, soft, non-tender to palpation, normal BS Skin: Warm and dry, significant scarring evident on LLE from prior surgery. Neurological: MS: awake, alert and oriented x3, normal speech and fund of knowledge Motor: moves all extremities antigravity Psych: normal affect  CBC    Component Value Date/Time   WBC 7.9 10/17/2021 0022   RBC 4.03 (L) 10/17/2021 0022   HGB 12.8 (L) 10/17/2021 0022   HGB 13.5 12/07/2019 1029   HCT 40.2 10/17/2021 0022   HCT 39.6 12/07/2019 1029   PLT 121  (L) 10/17/2021 0022   PLT 178 12/07/2019 1029   MCV 99.8 10/17/2021 0022   MCV 94  12/07/2019 1029   MCH 31.8 10/17/2021 0022   MCHC 31.8 10/17/2021 0022   RDW 14.3 10/17/2021 0022   RDW 14.0 12/07/2019 1029   LYMPHSABS 1.3 03/23/2019 1030   LYMPHSABS 1.7 12/05/2016 1320   MONOABS 0.4 03/23/2019 1030   EOSABS 0.1 03/23/2019 1030   EOSABS 0.1 12/05/2016 1320   BASOSABS 0.0 03/23/2019 1030   BASOSABS 0.0 12/05/2016 1320   CMP     Component Value Date/Time   NA 138 10/17/2021 0022   NA 139 07/24/2021 1018   K 4.0 10/17/2021 0022   CL 102 10/17/2021 0022   CO2 26 10/17/2021 0022   GLUCOSE 156 (H) 10/17/2021 0022   BUN 23 10/17/2021 0022   BUN 23 07/24/2021 1018   CREATININE 1.84 (H) 10/17/2021 0022   CREATININE 1.24 10/08/2014 1047   CALCIUM 9.0 10/17/2021 0022   PROT 6.6 08/30/2020 1422   ALBUMIN 4.1 08/30/2020 1422   AST 16 08/30/2020 1422   ALT 11 08/30/2020 1422   ALKPHOS 70 08/30/2020 1422   BILITOT 0.5 08/30/2020 1422   GFRNONAA 40 (L) 10/17/2021 0022   GFRNONAA 63 10/08/2014 1047   GFRAA 54 (L) 10/24/2020 1138   GFRAA 73 10/08/2014 1047   Resp Panel by RT-PCR (Flu A&B, Covid) Nasopharyngeal Swab [[id number]] Collected: 10/17/21 0315  Specimen: Nasopharyngeal(NP) swabs in vial transport medium from Nasopharyngeal Swab Updated: 10/17/21 0418   SARS Coronavirus 2 by RT PCR NEGATIVE   Influenza A by PCR NEGATIVE   Influenza B by PCR NEGATIVE   Troponin I (High Sensitivity) <18 ng/L 33 High   31 High  CM    EKG: personally reviewed my interpretation is EKG with mild bradycardia HR 60, regular rhythm, low voltage EKG, minimal STE in precordial leads and some T wave inversions also seen in prior EKGs  CXR: personally reviewed my interpretation is clear lung spaces , cardiomegaly, similar to prior CXR  Assessment & Plan by Problem: Principal Problem:   Chest pain  Juan Calderon 76-year-old African-American male with past medical history of obesity, CAD, chronic  diastolic heart failure, hypertension, hyperlipidemia, GERD, bradycardia, frequent PVCs, NSVT, paroxysmal atrial fibrillation, OSA, chronic osteomyelitis of LLE, chronic pain on opioid medication, gout, who presents to [facility] MD for chest pain and admitted for ACS rule out.  #Atypical chest pain #Paroxysmal atrial fibrillation #Nonsustained V. Tach, bradycardia, PVCs #CAD #Chronic diastolic heart failure #GERD Patient presents for acute onset chest pain at rest that is substernal.  EKG without ST elevations or other signs of ischemic changes, bradycardic and regular rhythm.  Troponins flat so far, 31, 33.  Regular rhythm on auscultation.  Chest pain nonreproducible to palpation.  No epigastric tenderness.  Maalox did not improve his pain.  Will give sublingual nitroglycerin and monitor his response.  Currently low concern for ACS/cardiac chest pain though given his risk factors will admit for observation.  Suspect chest pain may be GI related given history of GERD and onset after lying down.  Follows with EP clinic, Dr. [name] and cardiology, Dr Angelena Form.  Last stress Myoview 2016 with no ischemia.  Last 24-hour heart monitor showed atrial fibs, bradycardia with rates as low as 40 bpm, frequent PVCs and nonsustained VT.  Last echo July 2020 with LVEF 55-60%.  Euvolemic on exam.  Endorses chronic orthopnea and mild SOB on exertion at baseline which is chronic and unchanged. Plan: -Repeat troponin until downtrending -SL nitroglycerin, did not improve patient's symptoms -Continuous telemetry -Continue home Eliquis 5 mg twice daily -  Continue flecainide 100 mg every 12hr -Continue home Lopressor 25 mg twice daily -Continue home terazosin 5 mg daily (seems he is on this for BP?) -Continue home torsemide 20 mg daily -Continue home Protonix 40 mg daily -Repeat echo, can dc if we don't feel strongly about it -Tylenol  #HTN #HLD Patient initially hypertensive with BP 173/73 arrival, blood pressures now  normotensive.  Last lipid profile November 2021 with LDL 36. Plan: -Continue home Lipitor 20 mg -Continue home losartan 50 mg daily -Continue home Lopressor and torsemide as above  #History of gout No current flare. Plan: -Continue allopurinol 300 mg daily  #Severe OSA on CPAP #Obesity related restriction Diagnosed by PSG in 2014 with AHI 59.  Seen by Dr. Carlis Abbott with pulmonology July 2021 who notes he does not have ILD or COPD, active lung disease on PFTs, no benefit from LABA-ICS or LAMA inhalers, discontinue inhalers aside from Advair which he can restart if he has worsening symptoms.  Reports is taking inhaler twice a day for past several days. Plan: -CPAP nightly -Albuterol as needed for now, need to verify which inhaler he has been using.  #Chronic pain 2/2 OA of multiple joints Patient's pain is functional limiting, he benefits from the use of chronic opioids to improve functional status.  Denies recent falls.  PDMP reviewed and appropriate.  Seems he may be getting left shoulder repair soon with orthopedics. Plan: -Oxycodone 10 mg continued -Tylenol  #Type 2 diabetes, well controlled A1c 07/2021 6.5%.  On metformin 1000 mg twice daily.  Blood sugars appropriate this admission.   Plan: -Continued on metformin 1000 mg twice daily  #History of hypomagnesemia On chronic magnesium supplementation.  Last magnesium level 1.0 (low) on 07/24/21.  Potential etiologies include diuretic use, PPI use, history of alcohol abuse, CKD.  Unable to tolerate twice daily magnesium supplement dosing 2/2 diarrhea.  Currently on maximum tolerated magnesium supplement.  Plan: -Recheck magnesium  #CKD stage IIIb On arrival, BUN 23, creatinine 1.84, GFR 40 which is stable from previous. Plan: -Avoid nephrotoxic agents  Diet: Heart VTE: Eliquis IVF: none Code: Full  Dispo: Admit patient to Observation with expected length of stay less than 2 midnights.  Portions of this report may have been  transcribed using voice recognition software. Every effort was made to ensure accuracy; however, inadvertent computerized transcription errors may be present.   Signed: Wayland Denis, MD 10/17/2021, 4:49 AM  Pager: [phone] After 5pm on weekdays and 1pm on weekends: On Call pager: [phone]

## 2021-10-17 NOTE — ED Provider Notes (Signed)
[facility] Emergency Department Provider Note MRN:  [id number]  Arrival date & time: 10/17/21     Chief Complaint   Chest Pain   History of Present Illness   Juan Calderon is a 66 y.o. year-old male with a history of diabetes, A-fib presenting to the ED with chief complaint of chest pain.  Central and left-sided chest pain described as an ache starting suddenly at 11 PM.  Associated with some lightheadedness.  Denies nausea vomiting, no shortness of breath.  No recent leg pain or swelling.  No fever or cough.  Pain is persistent.  Review of Systems  A thorough review of systems was obtained and all systems are negative except as noted in the HPI and PMH.   Patient's Health History    Past Medical History:  Diagnosis Date   Alcohol abuse     Bradycardia    C6 radiculopathy 01/24/2016   Right upper extremity, mild to moderate electrically by EMG on 01/24/2016   Cataract    Left eye   Chronic diastolic heart failure ([place])     with mild left ventricular hypertrophy on Echo 02/2010   Chronic obstructive pulmonary disease (HCC)     Chronic osteomyelitis of femur ([place]) 04/06/2016   Chronic osteomyelitis of left femur ([place]) 11/22/2017   Left femur s/p prior trauma   Chronic osteomyelitis of left femur ([place]) 11/22/2017   Brodie's abscess: left femur s/p prior trauma.  Underwent partial excision and curettage of left femoral osteomyelitis at [facility] 12/30/2017 with grossly purulent material encountered within the medullary canal of the left distal femur.  Cultures grew MSSA.  Post-operatively received 6 weeks of IV antibiotics through 02/10/2018.  CRP elevated at 60.3 at end of IV antibiotic course so continued on Keflex   Chronic pain syndrome     Left arm and leg s/p traumatic injury    Chronic renal insufficiency     Coronary artery disease     25% LAD stenosis on cath 2007.  Stable angina.   Diverticulosis     Diverticulosis 11/12/2013   Essential hypertension      Frequent PVCs    Gastroesophageal reflux disease     Gout     Hyperlipidemia LDL goal < 100     Internal hemorrhoids without complication 51/88/4166   Long-term current use of opiate analgesic 09/07/2016   Mild carpal tunnel syndrome of right wrist 01/24/2016   Mild degree electrically per EMG 01/24/2016    Mild carpal tunnel syndrome of right wrist 01/24/2016   Mild degree electrically per EMG 01/24/2016    Morbid obesity with BMI of 40.0-44.9, adult (HCC)     Normocytic anemia     NSVT (nonsustained ventricular tachycardia)    Obstructive sleep apnea     Moderate, AHI 29.8 per hour with moderately loud snoring and oxygen desaturation to a nadir of 79%. CPAP titration resulted in a prescription for 17 CWP.     Open-angle glaucoma     Osteoarthritis cervical spine     Osteoarthritis of left knee 06/19/2013   Tricompartmental disease.  Treated with double hinged upright knee brace, steroid/xylocaine knee injections, and NSAIDs    Osteoporosis 05/14/2017   s/p fracture of the right humerus from a fall at ground hight   Persistent atrial fibrillation (HCC)    Right rotator cuff tear     Large full-thickness tear of the supraspinatus with mild retraction but no atrophy    Right rotator cuff tear 04/25/2013  Large full-thickness tear of the supraspinatus with mild retraction but no atrophy     Secondary male hypogonadism 02/07/2017   Likely secondary to chronic opioid use   Secondary male hypogonadism 02/07/2017   Likely secondary to chronic opioid use   Subclinical hypothyroidism     Tubular adenoma of colon 11/22/2017   Specifics unknown.  Repeat colonoscopy 08/12/2018 with six 3-6 mm tubular adenomas removed endoscopically.   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 (HCC)     Vasomotor rhinitis 04/25/2013    Past Surgical History:  Procedure Laterality Date   A-FLUTTER ABLATION N/A 03/24/2019   Procedure: A-FLUTTER ABLATION;  Surgeon: [name], MD;   Location: [facility];  Service: Cardiovascular;  Laterality: N/A;   CARDIOVERSION N/A 12/30/2014   Procedure: CARDIOVERSION;  Surgeon: [name], MD;  Location: [facility] ENDOSCOPY;  Service: Cardiovascular;  Laterality: N/A;   FRACTURE SURGERY Left 1980's   Elbow   Left arm surgery     Left leg surgery     SHOULDER SURGERY     Right    Family History  Problem Relation Age of Onset   Heart failure Mother    Asthma Mother    Alzheimer's disease Father    Arthritis Son    Hypertension Sister    Hypertension Sister    Hypertension Sister    Cancer Brother    Cancer Brother        unknown   Early death Brother        Gun Shot Wound   Cancer Brother        prostate   Prostate cancer Brother    Heart attack Neg Hx    Stroke Neg Hx    Colon cancer Neg Hx    Esophageal cancer Neg Hx    Pancreatic cancer Neg Hx    Stomach cancer Neg Hx    Liver disease Neg Hx     Social History   Socioeconomic History   Marital status: Widowed    Spouse name: Not on file   Number of children: Not on file   Years of education: Not on file   Highest education level: Not on file  Occupational History   Occupation: Disabled  Tobacco Use   Smoking status: Never   Smokeless tobacco: Never  Vaping Use   Vaping Use: Never used  Substance and Sexual Activity   Alcohol use: Yes    Alcohol/week: 14.0 standard drinks    Types: 14 Cans of beer per week   Drug use: No   Sexual activity: Not Currently  Other Topics Concern   Not on file  Social History Narrative   Current Social History 12/14/2020      Patient lives with a friend in a home which is 1 story. There are 4 steps with handrails up to the entrance the patient uses.       Patient's method of transportation is personal car.      The highest level of education was high school diploma.      The patient currently disabled.      Identified important Relationships are "Family"       Pets : None       Interests / Fun: "Watch  TV"       Current Stressors: "None"      Religious / Personal Beliefs: Beverly Sessions, RN,BSN         Social Determinants of  Health   Financial Resource Strain: Not on file  Food Insecurity: Not on file  Transportation Needs: Not on file  Physical Activity: Not on file  Stress: Not on file  Social Connections: Not on file  Intimate Partner Violence: Not on file     Physical Exam   Vitals:   10/17/21 0332 10/17/21 0500  BP: (!) 145/78 (!) 157/89  Pulse: (!) 59 (!) 58  Resp: 17 18  Temp: 98 F (36.7 C)   SpO2: 98% 97%    CONSTITUTIONAL: Well-appearing, NAD NEURO/PSYCH:  Alert and oriented x 3, no focal deficits EYES:  eyes equal and reactive ENT/NECK:  no LAD, no JVD CARDIO: Regular rate, well-perfused, normal S1 and S2 PULM:  CTAB no wheezing or rhonchi GI/GU:  non-distended, non-tender MSK/SPINE:  No gross deformities, no edema SKIN:  no rash, atraumatic   *Additional and/or pertinent findings included in MDM below  Diagnostic and Interventional Summary    EKG Interpretation  Date/Time:  Tuesday October 17 2021 00:08:22 EST Ventricular Rate:  60 PR Interval:    QRS Duration: 126 QT Interval:  496 QTC Calculation: 496 R Axis:   38 Text Interpretation: Wide QRS rhythm Non-specific intra-ventricular conduction block Nonspecific T wave abnormality Abnormal ECG When compared with ECG of 28-Aug-2020 09:41, PREVIOUS ECG IS PRESENT Confirmed by Gerlene Fee [phone]) on 10/17/2021 2:31:12 AM       Labs Reviewed  BASIC METABOLIC PANEL - Abnormal; Notable for the following components:      Result Value   Glucose, Bld 156 (*)    Creatinine, Ser 1.84 (*)    GFR, Estimated 40 (*)    All other components within normal limits  CBC - Abnormal; Notable for the following components:   RBC 4.03 (*)    Hemoglobin 12.8 (*)    Platelets 121 (*)    All other components within normal limits  TROPONIN I (HIGH SENSITIVITY) - Abnormal; Notable for the following  components:   Troponin I (High Sensitivity) 31 (*)    All other components within normal limits  TROPONIN I (HIGH SENSITIVITY) - Abnormal; Notable for the following components:   Troponin I (High Sensitivity) 33 (*)    All other components within normal limits  RESP PANEL BY RT-PCR (FLU A&B, COVID) ARPGX2  MAGNESIUM  TROPONIN I (HIGH SENSITIVITY)    DG Chest 2 View  Final Result      Medications  nitroGLYCERIN (NITROSTAT) SL tablet 0.4 mg (0.4 mg Sublingual Given 10/17/21 0547)  allopurinol (ZYLOPRIM) tablet 300 mg (has no administration in time range)  atorvastatin (LIPITOR) tablet 20 mg (has no administration in time range)  apixaban (ELIQUIS) tablet 5 mg (has no administration in time range)  flecainide (TAMBOCOR) tablet 100 mg (has no administration in time range)  losartan (COZAAR) tablet 50 mg (has no administration in time range)  metoprolol tartrate (LOPRESSOR) tablet 25 mg (has no administration in time range)  oxyCODONE-acetaminophen (PERCOCET/ROXICET) 5-325 MG per tablet 2 tablet (has no administration in time range)  pantoprazole (PROTONIX) EC tablet 40 mg (has no administration in time range)  terazosin (HYTRIN) capsule 5 mg (has no administration in time range)  torsemide (DEMADEX) tablet 20 mg (has no administration in time range)  albuterol (VENTOLIN HFA) 108 (90 Base) MCG/ACT inhaler 1-2 puff (has no administration in time range)  metFORMIN (GLUCOPHAGE) tablet 1,000 mg (has no administration in time range)  alum & mag hydroxide-simeth (MAALOX/MYLANTA) 200-200-20 MG/5ML suspension 30 mL (30 mLs Oral Given 10/17/21 0345)  Procedures  /  Critical Care Procedures  ED Course and Medical Decision Making  Initial Impression and Ddx Concern for possible ACS.  Also considering GERD related pain given the onset when getting in bed and rolling around laying flat.  Vital signs are reassuring, exam largely noncontributory.  No evidence of DVT, no shortness of breath, doubt  PE  Past medical/surgical history that increases complexity of ED encounter: A-fib, diabetes  Interpretation of Diagnostics I personally reviewed the EKG and my interpretation is as follows: Sinus rhythm with nonspecific findings    Troponin is minimally elevated, awaiting repeat  Patient Reassessment and Ultimate Disposition/Management Suspect patient will need admission for chest pain rule out.  Patient management required discussion with the following services or consulting groups:  Hospitalist Service  Complexity of Problems Addressed Acute illness or injury that poses threat of life of bodily function  Additional Data Reviewed and Analyzed Further history obtained from: Further history from spouse/family member  Additional Factors Impacting ED Encounter Risk Consideration of hospitalization  Barth Kirks. [place]onia Small, [place] [email or web address]  Final Clinical Impressions(s) / ED Diagnoses     ICD-10-CM   1. Chest pain, unspecified type  R07.9       ED Discharge Orders     None        Discharge Instructions Discussed with and Provided to Patient:   Discharge Instructions   None      Maudie Flakes, MD 10/17/21 [phone]

## 2021-10-17 NOTE — ED Notes (Signed)
Pt ambulated to bathroom 

## 2021-10-17 NOTE — Progress Notes (Signed)
Subjective: No acute overnight events.   Patient was seen at bedside during rounds today. His girlfriend is at bedside with him. He reports ongoing left sided chest pain. CP is constant, dull, and non-radiating. It is not pleuritic. He reports that movement makes the pain worse.  Objective:  Vital signs in last 24 hours: Vitals:   10/17/21 1015 10/17/21 1100 10/17/21 1145 10/17/21 1230  BP: 140/73 (!) 147/74 (!) 156/78 (!) 147/79  Pulse: (!) 58 (!) 56 62 60  Resp: 19 20 17 15   Temp:      TempSrc:      SpO2: 96% 96% 96% 95%  Weight:      Height:       Constitutional: alert, well-appearing, in NAD HENT: normocephalic, atraumatic, mucous membranes moist Eyes: conjunctiva non-erythematous, EOMI Cardiovascular: reproducible chest pain with palpation of chest wall; non-edematous bilateral LE. CP does not appear to be pleuritic.  Pulmonary/Chest: normal work of breathing on room air Neurological: A&O x 3, follows commands  Skin: warm and dry  Assessment/Plan:  Principal Problem:   Chest pain Active Problems:   Essential hypertension   Chronic diastolic heart failure (HCC)   Gastroesophageal reflux disease without esophagitis   Hyperlipidemia   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 (HCC)   Coronary artery disease involving native coronary artery with angina pectoris (HCC)   Chronic kidney disease (CKD), stage III (moderate) (HCC)  #Atypical chest pain, likely MSK chest pain  #CAD #Chronic diastolic heart failure #GERD Ongoing substernal chest pain. ACS/cardiac chest pain ruled out, with flat trops 31 -> 33 -> 30 and EKG without acute ST changes. Echo without any acute changes, EF is 50-55% (EF 55-60% on 2020 echo) and no regional wall abnormalities. I suspect this is less likely GERD given no improvement with Maalox and Protonix. Hx and exam findings point to MSK-related chest pain; will treat for such. Trivial pericardial effusion noted on  echo; less likely to be pericarditis given exam findings and lack of pleuritic CP.  - Tylenol 1g TID  - Voltaren gel - Lidocaine patch   - Continue home Protonix 40 mg daily - Continue home terazosin 5 mg daily  - Continue home torsemide 20 mg daily - Continuous telemetry   #Paroxysmal atrial fibrillation #Nonsustained V. Tach, bradycardia, PVCs On lopressor, Flecainide, and Eliquis. Has hx of sinus brady, and given HR's in the 50's this admission, we are holding his Flecainide and Lopressor and consulted cardiology for assistance.  - Cardiology consulted, appreciate assistance  - Holding home flecainide 100 mg every 12hr - Holding home Lopressor 25 mg twice daily - Continue home Eliquis 5 mg twice daily  #HTN Normotensive at this time -Continue home losartan 50 mg daily -Continue home Lopressor and torsemide as above  #HLD LDL 36 (Nov 2021)  -Continue home Lipitor 20 mg  #History of gout No current flare -Continue allopurinol 300 mg daily   #Severe OSA on CPAP #Obesity related restriction -CPAP nightly -Albuterol prn    #Chronic pain 2/2 OA of multiple joints -Continued Oxycodone 10 mg  -Tylenol   #Type 2 diabetes, well controlled A1c 07/2021 6.5%.  On metformin 1000 mg twice daily.  Blood sugars appropriate this admission. -Continued on metformin 1000 mg twice daily   #History of hypomagnesemia On chronic magnesium supplementation. Currently on maximum tolerated magnesium supplement. Repeat Mag 1.1 today. - IV Mag 2g given    #CKD stage IIIb Chronic and stable -Avoid nephrotoxic agents   Diet: Heart VTE:  Eliquis IVF: none Code: Full   Lajean Manes, MD  Internal Medicine Resident, PGY-1 Pager: [phone] After 5pm on weekdays and 1pm on weekends: On Call pager [phone]

## 2021-10-17 NOTE — ED Triage Notes (Signed)
Pain in the left side of the chest, started about 45 minutes ago. Has had a cough on and off for about a month.

## 2021-10-17 NOTE — Consult Note (Addendum)
Cardiology Consultation:   Patient ID: JALEAL SCHLIEP MRN: [id number]; DOB: [birth year]  Admit date: 10/17/2021 Date of Consult: 10/17/2021  PCP:  Axel Filler, MD   [facility] Providers Cardiologist:  Lauree Chandler, MD  Electrophysiologist:  [name], MD  {   Patient Profile:   GEARALD STONEBRAKER is a 66 y.o. male with a hx of chronic CHF (Diastolic), CAD (non-obstructive by cath in 2007, neg stress test 2016), persistent AFib, AFlutter (ablated 2020), COPD, HTN, HLD, gout, GERD, DM, chronic venous stasis, morbid obesity, CKD (III), OSA recently started CPAP who is being seen 10/17/2021 for the evaluation of CP, bradycardia at the request of Dr. Jimmye Norman.  Has has of recurrent osteomyelitis, 2017, 2019 (L femur) (MSSA) Chronic pain, chronic opioid use BMI 41.37  Afib Hx Diagnosed 2016 2020 developed AFlutter > ablated (CTI)  AAD  [place] remotely, poorly tolerated  Hx of digoxin stopped with rates 30's and BB was reduced 2016 BB was stopped 05/2016 ? Bradycardia >> resumed in 2017 after a CP episode Flecainide, 2016 (current)  History of Present Illness:   Mr. Imel last saw Dr. [name] April 2021, at that time the pt based on lack of symptom did not think he had had more Afib/flutter.  Mildly volume OL and urged low sodium diet.    He saw Dr. Angelena Form most recently for [facility] Nov 2021, he mentions also known to have PVCs and NSVT, and AT perhaps remotely).  He was doing well, without cardiac symptoms  He came to the ER with a number of complaints, admitted for further evaluation, but apparently mostly CP  LABS K+ 4.0 Mag 1.1 BUN/Creat 23/1.84 (baseline looks about 1.5-1.8) HS Trop 31, 33, 30 WBC 7.9 H/H 12/40 Plts 121  He tells me that last evening when he rolled over onto his L side he developed a sudden L sided CP, it persisted so he came in.  No radiation, no unusual SOB, no N/V. He reports that after here, seemed to settle towards the R  side. Raining his L arm up can provoke the pain and turning over in bed seems to make it worse as well. Ambulating from the bathroom did not exacerbate his pain, also noted that his HR went up nicely when ambulating to the 70's  Past Medical History:  Diagnosis Date   Alcohol abuse     Bradycardia    C6 radiculopathy 01/24/2016   Right upper extremity, mild to moderate electrically by EMG on 01/24/2016   Cataract    Left eye   Chronic diastolic heart failure ([place])     with mild left ventricular hypertrophy on Echo 02/2010   Chronic obstructive pulmonary disease (HCC)     Chronic osteomyelitis of femur ([place]) 04/06/2016   Chronic osteomyelitis of left femur ([place]) 11/22/2017   Left femur s/p prior trauma   Chronic osteomyelitis of left femur ([place]) 11/22/2017   Brodie's abscess: left femur s/p prior trauma.  Underwent partial excision and curettage of left femoral osteomyelitis at [facility] 12/30/2017 with grossly purulent material encountered within the medullary canal of the left distal femur.  Cultures grew MSSA.  Post-operatively received 6 weeks of IV antibiotics through 02/10/2018.  CRP elevated at 60.3 at end of IV antibiotic course so continued on Keflex   Chronic pain syndrome     Left arm and leg s/p traumatic injury    Chronic renal insufficiency     Coronary artery disease     25% LAD stenosis on cath 2007.  Stable angina.   Diverticulosis     Diverticulosis 11/12/2013   Essential hypertension     Frequent PVCs    Gastroesophageal reflux disease     Gout     Hyperlipidemia LDL goal < 100     Internal hemorrhoids without complication 23/53/6144   Long-term current use of opiate analgesic 09/07/2016   Mild carpal tunnel syndrome of right wrist 01/24/2016   Mild degree electrically per EMG 01/24/2016    Mild carpal tunnel syndrome of right wrist 01/24/2016   Mild degree electrically per EMG 01/24/2016    Morbid obesity with BMI of 40.0-44.9, adult (HCC)     Normocytic anemia     NSVT  (nonsustained ventricular tachycardia)    Obstructive sleep apnea     Moderate, AHI 29.8 per hour with moderately loud snoring and oxygen desaturation to a nadir of 79%. CPAP titration resulted in a prescription for 17 CWP.     Open-angle glaucoma     Osteoarthritis cervical spine     Osteoarthritis of left knee 06/19/2013   Tricompartmental disease.  Treated with double hinged upright knee brace, steroid/xylocaine knee injections, and NSAIDs    Osteoporosis 05/14/2017   s/p fracture of the right humerus from a fall at ground hight   Persistent atrial fibrillation (HCC)    Right rotator cuff tear     Large full-thickness tear of the supraspinatus with mild retraction but no atrophy    Right rotator cuff tear 04/25/2013   Large full-thickness tear of the supraspinatus with mild retraction but no atrophy     Secondary male hypogonadism 02/07/2017   Likely secondary to chronic opioid use   Secondary male hypogonadism 02/07/2017   Likely secondary to chronic opioid use   Subclinical hypothyroidism     Tubular adenoma of colon 11/22/2017   Specifics unknown.  Repeat colonoscopy 08/12/2018 with six 3-6 mm tubular adenomas removed endoscopically.   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 (HCC)     Vasomotor rhinitis 04/25/2013    Past Surgical History:  Procedure Laterality Date   A-FLUTTER ABLATION N/A 03/24/2019   Procedure: A-FLUTTER ABLATION;  Surgeon: [name], MD;  Location: [place] [facility];  Service: Cardiovascular;  Laterality: N/A;   CARDIOVERSION N/A 12/30/2014   Procedure: CARDIOVERSION;  Surgeon: [name], MD;  Location: [facility] ENDOSCOPY;  Service: Cardiovascular;  Laterality: N/A;   FRACTURE SURGERY Left 1980's   Elbow   Left arm surgery     Left leg surgery     SHOULDER SURGERY     Right     Home Medications:  Prior to Admission medications   Medication Sig Start Date End Date Taking? Authorizing Provider  albuterol (VENTOLIN HFA) 108  (90 Base) MCG/ACT inhaler INHALE 1-2 PUFFS INTO THE LUNGS EVERY 6 (SIX) HOURS AS NEEDED FOR SHORTNESS OF BREATH. 09/28/21  Yes Axel Filler, MD  allopurinol (ZYLOPRIM) 300 MG tablet TAKE 1 TABLET BY MOUTH EVERY DAY Patient taking differently: Take 300 mg by mouth daily. 03/23/21  Yes Axel Filler, MD  atorvastatin (LIPITOR) 20 MG tablet TAKE 1 TABLET BY MOUTH EVERY DAY Patient taking differently: Take 20 mg by mouth daily. 05/03/21  Yes Axel Filler, MD  Calcium Citrate-Vitamin D 315-5 MG-MCG TABS Take 2 tablets by mouth daily. 10/16/21  Yes Axel Filler, MD  ELIQUIS 5 MG TABS tablet TAKE 1 TABLET BY MOUTH TWICE A DAY Patient taking differently: Take 5 mg by mouth 2 (two) times daily.  07/04/21  Yes Axel Filler, MD  flecainide (TAMBOCOR) 100 MG tablet TAKE 1 TABLET BY MOUTH EVERY 12 HOURS Patient taking differently: Take 100 mg by mouth 2 (two) times daily. 06/05/21  Yes Sid Falcon, MD  losartan (COZAAR) 50 MG tablet Take 1 tablet (50 mg total) by mouth daily. 06/06/20  Yes Axel Filler, MD  magnesium oxide (MAG-OX) 400 MG tablet Take 1 tablet (400 mg total) by mouth 2 (two) times daily. 10/25/20  Yes Axel Filler, MD  metFORMIN (GLUCOPHAGE) 1000 MG tablet TAKE 1 TABLET (1,000 MG TOTAL) BY MOUTH 2 (TWO) TIMES DAILY WITH A MEAL. 06/05/21  Yes Sid Falcon, MD  metoprolol tartrate (LOPRESSOR) 25 MG tablet TAKE 1 TABLET BY MOUTH TWICE A DAY Patient taking differently: Take 25 mg by mouth 2 (two) times daily. 03/30/21  Yes Aldine Contes, MD  oxyCODONE-acetaminophen (PERCOCET) 10-325 MG tablet Take 1 tablet by mouth every 8 (eight) hours as needed for pain. 09/22/21  Yes Axel Filler, MD  pantoprazole (PROTONIX) 40 MG tablet Take 1 tablet (40 mg total) by mouth daily. 07/24/21  Yes Axel Filler, MD  Sorbitol 70 % SOLN Take 15-60 mLs by mouth daily as needed (for constipation). 04/12/21  Yes Axel Filler,  MD  terazosin (HYTRIN) 5 MG capsule TAKE 1 CAPSULE (5 MG TOTAL) BY MOUTH AT BEDTIME. 03/30/21  Yes Aldine Contes, MD  torsemide (DEMADEX) 20 MG tablet TAKE 1 TABLET BY MOUTH EVERY DAY Patient taking differently: Take 20 mg by mouth daily. 01/27/21  Yes Axel Filler, MD  Blood Glucose Monitoring Suppl [facility] VERIO) w/Device KIT 1 each by Does not apply route daily. 11/04/15   Loleta Chance, MD  [employer] VITAMIN B12 1000 MCG tablet TAKE 1 TABLET BY MOUTH EVERY DAY Patient taking differently: Take 1,000 mcg by mouth daily. 06/16/21   Axel Filler, MD  glucose blood [facility] VERIO) test strip Use as instructed 05/24/16   Oval Linsey, MD  latanoprost (XALATAN) 0.005 % ophthalmic solution Place 1 drop into both eyes at bedtime. Patient not taking: Reported on 10/17/2021 10/11/16   Oval Linsey, MD  nitroGLYCERIN (NITROSTAT) 0.4 MG SL tablet Place 1 tablet (0.4 mg total) under the tongue every 5 (five) minutes as needed for chest pain. 07/18/18   Oval Linsey, MD  Olopatadine HCl 0.2 % SOLN Place 1 drop into both eyes daily. Patient not taking: Reported on 10/17/2021    [provider]  Jonetta Speak LANCETS 61P MISC Use 1 strip daily 05/24/16   Oval Linsey, MD  tadalafil (CIALIS) 10 MG tablet Take 1 tablet (10 mg total) by mouth as needed for erectile dysfunction. Patient not taking: Reported on 10/17/2021 01/23/21 01/23/22  Axel Filler, MD  Wound Dressings [facility] WOUND DRESSING) PADS Apply 10 each topically daily. 11/02/20   [name], MD    Inpatient Medications: Scheduled Meds:  acetaminophen  1,000 mg Oral TID   allopurinol  300 mg Oral Daily   apixaban  5 mg Oral BID   atorvastatin  20 mg Oral Daily   diclofenac Sodium  2 g Topical QID   lidocaine  1 patch Transdermal Q24H   losartan  50 mg Oral Daily   metFORMIN  1,000 mg Oral BID WC   pantoprazole  40 mg Oral Daily   terazosin  5 mg Oral QHS   torsemide  20 mg Oral Daily   Continuous  Infusions:  PRN Meds: albuterol, nitroGLYCERIN, oxyCODONE-acetaminophen  Allergies:  Allergies  Allergen Reactions   Ramipril Swelling    Facial swelling   Tape Other (See Comments)    Irritates skin/ pls use paper tape   Testosterone Rash    Social History:   Social History   Socioeconomic History   Marital status: Widowed    Spouse name: Not on file   Number of children: Not on file   Years of education: Not on file   Highest education level: Not on file  Occupational History   Occupation: Disabled  Tobacco Use   Smoking status: Never   Smokeless tobacco: Never  Vaping Use   Vaping Use: Never used  Substance and Sexual Activity   Alcohol use: Yes    Alcohol/week: 14.0 standard drinks    Types: 14 Cans of beer per week   Drug use: No   Sexual activity: Not Currently  Other Topics Concern   Not on file  Social History Narrative   Current Social History 12/14/2020      Patient lives with a friend in a home which is 1 story. There are 4 steps with handrails up to the entrance the patient uses.       Patient's method of transportation is personal car.      The highest level of education was high school diploma.      The patient currently disabled.      Identified important Relationships are "Family"       Pets : None       Interests / Fun: "Watch TV"       Current Stressors: "None"      Religious / Personal Beliefs: Forensic scientist, RN,BSN         Social Determinants of Health   Financial Resource Strain: Not on file  Food Insecurity: Not on file  Transportation Needs: Not on file  Physical Activity: Not on file  Stress: Not on file  Social Connections: Not on file  Intimate Partner Violence: Not on file    Family History:   Family History  Problem Relation Age of Onset   Heart failure Mother    Asthma Mother    Alzheimer's disease Father    Arthritis Son    Hypertension Sister    Hypertension Sister    Hypertension Sister     Cancer Brother    Cancer Brother        unknown   Early death Brother        Horticulturist, commercial Wound   Cancer Brother        prostate   Prostate cancer Brother    Heart attack Neg Hx    Stroke Neg Hx    Colon cancer Neg Hx    Esophageal cancer Neg Hx    Pancreatic cancer Neg Hx    Stomach cancer Neg Hx    Liver disease Neg Hx      ROS:  Please see the history of present illness.  All other ROS reviewed and negative.     Physical Exam/Data:   Vitals:   10/17/21 1100 10/17/21 1145 10/17/21 1230 10/17/21 1400  BP: (!) 147/74 (!) 156/78 (!) 147/79 137/66  Pulse: (!) 56 62 60 (!) 58  Resp: 20 17 15  (!) 22  Temp:      TempSrc:      SpO2: 96% 96% 95% 95%  Weight:      Height:        Intake/Output Summary (Last 24 hours) at  10/17/2021 1511 Last data filed at 10/17/2021 1343 Gross per 24 hour  Intake 48.94 ml  Output --  Net 48.94 ml   Last 3 Weights 10/17/2021 10/05/2021 07/24/2021  Weight (lbs) 305 lb 305 lb 12.8 oz 303 lb 14.4 oz  Weight (kg) 138.347 kg 138.71 kg 137.848 kg     Body mass index is 41.37 kg/m.  General:  Well nourished, well developed, in no acute distress HEENT: normal Neck: no JVD Vascular: No carotid bruits; Distal pulses 2+ bilaterally Cardiac:  RRR; no murmurs, gallops or rubs Lungs:  CTA b/l, no wheezing, rhonchi or rales  Abd: soft, nontender, obese  Ext: no edema, brawny LE  Musculoskeletal:  No deformities,large scars LLE below the knee Skin: warm and dry  Neuro: no focal abnormalities noted Psych:  Normal affect   EKG:  The EKG was personally reviewed and demonstrates:    SR 60bpm, 1st degree Avblock 255m, QRS 1235m Old 08/28/20: SR 64bpm, PR 21039mQRS 110m73m12/21: SR 60bpm, PR 212ms48mS 108ms 24memetry:  Telemetry was personally reviewed and demonstrates:   SB/SR 55-60's, up to the 70's with ambulation No arrhythmias, no high AV block, no significant bradycardia  Relevant CV Studies:  10/17/21 TTE IMPRESSIONS   1. Left  ventricular ejection fraction, by estimation, is 50 to 55%. The  left ventricle has low normal function. Left ventricular endocardial  border not optimally defined to evaluate regional wall motion. There is  mild concentric left ventricular  hypertrophy. Left ventricular diastolic parameters were normal.   2. Right ventricular systolic function is normal. The right ventricular  size is mildly enlarged. Tricuspid regurgitation signal is inadequate for  assessing PA pressure.   3. The mitral valve is grossly normal. No evidence of mitral valve  regurgitation. No evidence of mitral stenosis.   4. The aortic valve is tricuspid. Aortic valve regurgitation is not  visualized. No aortic stenosis is present.   5. Aortic dilatation noted. There is moderate dilatation of the ascending  aorta, measuring 42 mm.   03/24/2019: EPS/ablation (Dr. TaylorLovena LeLUSIONS:  1. Isthmus-dependent right atrial flutter upon presentation.  2. Successful radiofrequency ablation of atrial flutter along the cavotricuspid isthmus with complete bidirectional isthmus block achieved.  3. No inducible arrhythmias following ablation.  4. No early apparent complications.   11/30/2014 lexiscan myoview Impression Exercise Capacity:  Lexiscan with no exercise. BP Response:  Normal blood pressure response. Clinical Symptoms:  There is dyspnea and chest pressure ECG Impression:  No significant ST segment change suggestive of ischemia. Comparison with Prior Nuclear Study: Compared to 05/10/12, no change.   Overall Impression:  Normal stress nuclear study.   LV Ejection Fraction:Study not gated. .  LV Wall Motion:  Study not gated due to atrial fibrillation; there appears to be significant LVE.   Laboratory Data:  High Sensitivity Troponin:   Recent Labs  Lab 10/17/21 0022 10/17/21 0221 10/17/21 0503  TROPONINIHS 31* 33* 30*     Chemistry Recent Labs  Lab 10/17/21 0022 10/17/21 0503  NA 138  --   K 4.0  --   CL  102  --   CO2 26  --   GLUCOSE 156*  --   BUN 23  --   CREATININE 1.84*  --   CALCIUM 9.0  --   MG  --  1.1*  GFRNONAA 40*  --   ANIONGAP 10  --     No results for input(s): PROT, ALBUMIN, AST, ALT, ALKPHOS, BILITOT in the  last 168 hours. Lipids No results for input(s): CHOL, TRIG, HDL, LABVLDL, LDLCALC, CHOLHDL in the last 168 hours.  Hematology Recent Labs  Lab 10/17/21 0022  WBC 7.9  RBC 4.03*  HGB 12.8*  HCT 40.2  MCV 99.8  MCH 31.8  MCHC 31.8  RDW 14.3  PLT 121*   Thyroid No results for input(s): TSH, FREET4 in the last 168 hours.  BNPNo results for input(s): BNP, PROBNP in the last 168 hours.  DDimer No results for input(s): DDIMER in the last 168 hours.   Radiology/Studies:  DG Chest 2 View Result Date: 10/17/2021 CLINICAL DATA:  Cough, chest pain EXAM: CHEST - 2 VIEW COMPARISON:  08/28/2020 FINDINGS: Cardiomegaly. No confluent airspace opacities or effusions. No edema. No acute bony abnormality. IMPRESSION: Cardiomegaly.  No active disease. Electronically Signed   By: Rolm Baptise M.D.   On: 10/17/2021 00:38    Assessment and Plan:   Paroxysmal Afib CHA2DS2Vasc is 4, on Eliquis, appropriately dosed Maintained for years on lopressor and flecainide Stable intervals Continue his metoprolol and flecainide  CP HS Trop are Low flat, probably 2/2 CKD No ischemic EKG changes Neg myoview 2016 No obstructive disease cath 2007 By his description musculoskeletal, rasing up his L arm made the pain worse, turning from one side to the other in bed/stretcher also provokes Palpation of the CW did not  Hypomagnesemia Home med list report mag ox 462m BID, he reports one of his PO doctors reduced his home dose, was on 8055mhe believes at one time BID Replacement has been ordered here, may need more then 2g though will defer to attending team Dr. TaLovena Leas seen the patient I have reached out to the office scheduler to make follow up with the office   Risk  Assessment/Risk Scores:    For questions or updates, please contact [facility] Please consult [email or web address] for contact info under   Signed, ReBaldwin JamaicaPA-C  10/17/2021 3:11 PM  EP Attending  Patient seen and examined. I saw him remotely a couple of years ago for atrial flutter s/p ablation. He presents today with non-cardiac chest pain. He has sinus brady and has been on flecainide. His ECG is unremarkable. His labs demonstrated hypomagnesemia. He will continue his flecainide and beta blocker and be admitted for observation. I suspect that his chest pain is musculoskeletal though I cannot reproduce by palpation. His exam demonstrates a regular brady with clear lungs and soft abdomen and no significant edema.   GrCarleene Overlieaylor,MD

## 2021-10-18 DIAGNOSIS — R072 Precordial pain: Secondary | ICD-10-CM | POA: Diagnosis not present

## 2021-10-18 DIAGNOSIS — R0789 Other chest pain: Secondary | ICD-10-CM | POA: Diagnosis not present

## 2021-10-18 DIAGNOSIS — K219 Gastro-esophageal reflux disease without esophagitis: Secondary | ICD-10-CM | POA: Diagnosis not present

## 2021-10-18 DIAGNOSIS — D696 Thrombocytopenia, unspecified: Secondary | ICD-10-CM

## 2021-10-18 DIAGNOSIS — I25118 Atherosclerotic heart disease of native coronary artery with other forms of angina pectoris: Secondary | ICD-10-CM | POA: Diagnosis not present

## 2021-10-18 DIAGNOSIS — R079 Chest pain, unspecified: Secondary | ICD-10-CM

## 2021-10-18 DIAGNOSIS — N183 Chronic kidney disease, stage 3 unspecified: Secondary | ICD-10-CM | POA: Diagnosis not present

## 2021-10-18 DIAGNOSIS — I5032 Chronic diastolic (congestive) heart failure: Secondary | ICD-10-CM | POA: Diagnosis not present

## 2021-10-18 DIAGNOSIS — E1122 Type 2 diabetes mellitus with diabetic chronic kidney disease: Secondary | ICD-10-CM | POA: Diagnosis not present

## 2021-10-18 DIAGNOSIS — E1140 Type 2 diabetes mellitus with diabetic neuropathy, unspecified: Secondary | ICD-10-CM | POA: Diagnosis not present

## 2021-10-18 DIAGNOSIS — I130 Hypertensive heart and chronic kidney disease with heart failure and stage 1 through stage 4 chronic kidney disease, or unspecified chronic kidney disease: Secondary | ICD-10-CM | POA: Diagnosis not present

## 2021-10-18 DIAGNOSIS — E785 Hyperlipidemia, unspecified: Secondary | ICD-10-CM | POA: Diagnosis not present

## 2021-10-18 LAB — BASIC METABOLIC PANEL
Anion gap: 10 (ref 5–15)
BUN: 23 mg/dL (ref 8–23)
CO2: 26 mmol/L (ref 22–32)
Calcium: 9.1 mg/dL (ref 8.9–10.3)
Chloride: 101 mmol/L (ref 98–111)
Creatinine, Ser: 1.67 mg/dL — ABNORMAL HIGH (ref 0.61–1.24)
GFR, Estimated: 45 mL/min — ABNORMAL LOW (ref >60)
Glucose, Bld: 139 mg/dL — ABNORMAL HIGH (ref 70–99)
Potassium: 4.3 mmol/L (ref 3.5–5.1)
Sodium: 137 mmol/L (ref 135–145)

## 2021-10-18 LAB — MAGNESIUM: Magnesium: 1.6 mg/dL — ABNORMAL LOW (ref 1.7–2.4)

## 2021-10-18 LAB — MRSA NEXT GEN BY PCR, NASAL: MRSA by PCR Next Gen: NOT DETECTED

## 2021-10-18 MED ORDER — DICLOFENAC SODIUM 1 % EX GEL: 2.0000 g | Freq: Four times a day (QID) | CUTANEOUS | 0 refills | Status: DC

## 2021-10-18 MED ORDER — ACETAMINOPHEN 500 MG PO TABS: 1000.0000 mg | ORAL_TABLET | Freq: Three times a day (TID) | ORAL | 0 refills | Status: DC | PRN

## 2021-10-18 MED ORDER — LIDOCAINE 5 % EX PTCH: 1.0000 | MEDICATED_PATCH | CUTANEOUS | 0 refills | Status: DC

## 2021-10-18 MED ORDER — MAGNESIUM SULFATE 4 GM/100ML IV SOLN
4.0000 g | Freq: Once | INTRAVENOUS | Status: AC
  Administered 2021-10-18: 4 g via INTRAVENOUS
  Filled 2021-10-18: qty 100

## 2021-10-18 NOTE — Plan of Care (Signed)
°  Problem: Education: Goal: Knowledge of General Education information will improve Description: Including pain rating scale, medication(s)/side effects and non-pharmacologic comfort measures 10/18/2021 0745 by Jae Dire, RN Outcome: Progressing 10/18/2021 0745 by Jae Dire, RN Outcome: Progressing   Problem: Health Behavior/Discharge Planning: Goal: Ability to manage health-related needs will improve Outcome: Progressing   Problem: Clinical Measurements: Goal: Ability to maintain clinical measurements within normal limits will improve Outcome: Progressing

## 2021-10-18 NOTE — Assessment & Plan Note (Signed)
Note to have asymptomatic mild thrombocytopenia during 10/17/21 hospital admission. Platelets were 121, previously 186 1 year ago. No purpura, petechiae or bleeding noted. Will follow up with a CBC in clinic.

## 2021-10-18 NOTE — Discharge Summary (Signed)
Name: Juan Calderon MRN: [id number] DOB: 16-Dec-1955 66 y.o. PCP: Axel Filler, MD  Date of Admission: 10/17/2021 12:03 AM Date of Discharge:  10/18/21 Attending Physician: Dr. Jimmye Norman  DISCHARGE DIAGNOSIS:  Primary Problem: Atypical chest pain   Hospital Problems: Principal Problem:   Chest pain Active Problems:   Essential hypertension   Chronic diastolic heart failure (HCC)   Gastroesophageal reflux disease without esophagitis   Hyperlipidemia   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 (HCC)   Coronary artery disease involving native coronary artery with angina pectoris (HCC)   Chronic kidney disease (CKD), stage III (moderate) (HCC)    DISCHARGE MEDICATIONS:   Allergies as of 10/18/2021       Reactions   Ramipril Swelling   Facial swelling   Tape Other (See Comments)   Irritates skin/ pls use paper tape   Testosterone Rash        Medication List     TAKE these medications    acetaminophen 500 MG tablet Commonly known as: TYLENOL Take 2 tablets (1,000 mg total) by mouth 3 (three) times daily as needed.   albuterol 108 (90 Base) MCG/ACT inhaler Commonly known as: VENTOLIN HFA INHALE 1-2 PUFFS INTO THE LUNGS EVERY 6 (SIX) HOURS AS NEEDED FOR SHORTNESS OF BREATH.   allopurinol 300 MG tablet Commonly known as: ZYLOPRIM TAKE 1 TABLET BY MOUTH EVERY DAY   atorvastatin 20 MG tablet Commonly known as: LIPITOR TAKE 1 TABLET BY MOUTH EVERY DAY   Calcium Citrate-Vitamin D 315-5 MG-MCG Tabs Take 2 tablets by mouth daily.   [facility] VITAMIN B12 1000 MCG tablet Generic drug: cyanocobalamin TAKE 1 TABLET BY MOUTH EVERY DAY What changed: how much to take   diclofenac Sodium 1 % Gel Commonly known as: VOLTAREN Apply 2 g topically 4 (four) times daily.   Eliquis 5 MG Tabs tablet Generic drug: apixaban TAKE 1 TABLET BY MOUTH TWICE A DAY What changed: how much to take   flecainide 100 MG tablet Commonly known as:  TAMBOCOR TAKE 1 TABLET BY MOUTH EVERY 12 HOURS What changed: when to take this   glucose blood test strip Commonly known as: OneTouch Verio Use as instructed   latanoprost 0.005 % ophthalmic solution Commonly known as: XALATAN Place 1 drop into both eyes at bedtime.   lidocaine 5 % Commonly known as: LIDODERM Place 1 patch onto the skin daily. Remove & Discard patch within 12 hours or as directed by MD   losartan 50 MG tablet Commonly known as: COZAAR Take 1 tablet (50 mg total) by mouth daily.   magnesium oxide 400 MG tablet Commonly known as: MAG-OX Take 1 tablet (400 mg total) by mouth 2 (two) times daily.   metFORMIN 1000 MG tablet Commonly known as: GLUCOPHAGE TAKE 1 TABLET (1,000 MG TOTAL) BY MOUTH 2 (TWO) TIMES DAILY WITH A MEAL.   metoprolol tartrate 25 MG tablet Commonly known as: LOPRESSOR TAKE 1 TABLET BY MOUTH TWICE A DAY   nitroGLYCERIN 0.4 MG SL tablet Commonly known as: NITROSTAT Place 1 tablet (0.4 mg total) under the tongue every 5 (five) minutes as needed for chest pain.   Olopatadine HCl 0.2 % Soln Place 1 drop into both eyes daily.   OneTouch Delica Lancets 86P Misc Use 1 strip daily   OneTouch Verio w/Device Kit 1 each by Does not apply route daily.   oxyCODONE-acetaminophen 10-325 MG tablet Commonly known as: PERCOCET Take 1 tablet by mouth every 8 (eight) hours as needed for  pain.   pantoprazole 40 MG tablet Commonly known as: PROTONIX Take 1 tablet (40 mg total) by mouth daily.   Sorbitol 70 % Soln Take 15-60 mLs by mouth daily as needed (for constipation).   Sorbsan Wound Dressing Pads Apply 10 each topically daily.   tadalafil 10 MG tablet Commonly known as: Cialis Take 1 tablet (10 mg total) by mouth as needed for erectile dysfunction.   terazosin 5 MG capsule Commonly known as: HYTRIN TAKE 1 CAPSULE (5 MG TOTAL) BY MOUTH AT BEDTIME.   torsemide 20 MG tablet Commonly known as: DEMADEX TAKE 1 TABLET BY MOUTH EVERY DAY         DISPOSITION AND FOLLOW-UP:  [name] How was discharged from [facility] in stable condition. At the hospital follow up visit please address:  Follow-up Recommendations: Consults: none  Labs: Magnesium  Studies: none  Medications: Tylenol, Voltaren and lidocaine patches added. Continued all prior pre-admission medications.    Follow-up Appointments:  Follow-up Information     Marilynn Rail Jossie Ng, NP Follow up.   Specialty: Cardiology Why: 11/14/21 @ 10:00AM for Dr. Mariella Saa information: Juan Calderon [phone]         Axel Filler, MD. Go in 12 day(s).   Specialty: Internal Medicine Why: for post hospital follow up visit on 2/27 at 1045 am Contact information: [address] [place] [address] [name]:  Patient Summary: #Atypical chest pain, likely MSK chest pain  #CAD #Chronic diastolic heart failure #GERD Presenting with dull 7/10 substernal chest pain that started suddenly when he rolled over in bed. CP radiated across his chest but did not go to his back or down either arm. ACS/cardiac chest pain ruled out, with flat trops 31 -> 33 -> 30, EKG without acute ST changes, and no improvement in CP with nitro. Echo without any acute changes, EF is 50-55% (EF 55-60% on 2020 echo) and no regional wall abnormalities. Less likely to be GERD given no improvement with Maalox and Protonix. Trivial pericardial effusion noted on echo; less likely to be pericarditis given exam findings and lack of pleuritic CP. Hx and exam findings point to MSK-related chest pain given reproducible chest pain (sometimes) and pain with palpation of the rib cage. He also has a hx of rotator cuff tendonitis of the left side and is planning to undergo surgery soon. He complains that CP is sometimes reproducible with movement of the left arm. Suspect ongoing pain related to  rotator cuff is also contributing to his overall pain level. We treated him for MSK related chest pain with Tylenol 1g TID, Voltaren gel, and lidocaine patch. After observing him the night, he reported improvement in chest pain with current tx. He reported that the pain improved from a 7 to a 4. Suspect that pain will continue to improve with current tx and rotator cuff surgery.   - Tylenol 1g TID  - Voltaren gel - Lidocaine patch   - Continued home Protonix 40 mg daily - Continued home terazosin 5 mg daily  - Continued home torsemide 20 mg daily - Continuous telemetry   He is discharged with tylenol, voltaren gel and lidocaine patches. Close follow up with PCP Dr Evette Doffing and Cardiology. Cardiology was consulted during this admission for sinus brady (see below), and he is to continue following with them in the outpatient setting  for possible stress test.    #Paroxysmal atrial fibrillation #Nonsustained V. Tach, bradycardia, PVCs On lopressor, Flecainide, and Eliquis. Has hx of sinus brady, and given HR's in the 50's this admission, we initially held his Flecainide and Lopressor and consulted cardiology for assistance. Cardiology saw him and decided to continue metoprolol and flecainide with same dosing. He is to follow up with cardiology and EP.  - Cardiology consulted this admission  - Continued home Eliquis 5 mg twice daily   #HTN Normotensive this admission  -Continued home losartan 50 mg daily -Continued home Lopressor and torsemide as above   #HLD LDL 36 (Nov 2021)  -Continued home Lipitor 20 mg   #History of gout No current flare -Continued allopurinol 300 mg daily   #Severe OSA on CPAP #Obesity related restriction -CPAP nightly -Albuterol prn    #Chronic pain 2/2 OA of multiple joints -Continued Oxycodone 10 mg  -Tylenol   #Type 2 diabetes, well controlled A1c 07/2021 6.5%.  On metformin 1000 mg twice daily.  Blood sugars appropriate this admission. -Continued on  metformin 1000 mg twice daily   #History of hypomagnesemia On chronic magnesium supplementation. Currently on maximum tolerated magnesium supplement. Repeat Mag 1.1 today. Improved to 1.6 with IV Mag 2g. IV Mag 4g given on day of discharge. Follow up with a repeat Mag level.    #CKD stage IIIb Chronic and stable -Avoided nephrotoxins    DISCHARGE INSTRUCTIONS:   Discharge Instructions     Call MD for:  difficulty breathing, headache or visual disturbances   Complete by: As directed    Call MD for:  extreme fatigue   Complete by: As directed    Call MD for:  hives   Complete by: As directed    Call MD for:  persistant dizziness or light-headedness   Complete by: As directed    Call MD for:  persistant nausea and vomiting   Complete by: As directed    Call MD for:  redness, tenderness, or signs of infection (pain, swelling, redness, odor or green/yellow discharge around incision site)   Complete by: As directed    Call MD for:  severe uncontrolled pain   Complete by: As directed    Call MD for:  temperature >100.4   Complete by: As directed    Diet - low sodium heart healthy   Complete by: As directed    Increase activity slowly   Complete by: As directed        SUBJECTIVE:  No acute overnight events. Patient was seen at bedside during rounds this morning. Pt reports feeling well this morning. He reports that his chest pain has improved compared to yesterday. It was a 7 when he came in, and is a 4 now. He is re-assured that this is not cardiac chest pain. He is instructed to continue current regimen, and to follow up with PCP and cardiology. He expresses understanding. He remains slightly anxious, but is re-assured. He has no questions or concerns at this time, and agrees to follow up at appropriate appointments.   All questions were addressed with patient prior to being discharged.   Discharge Vitals:   BP 136/70    Pulse 60    Temp 97.7 F (36.5 C)    Resp 16    Ht 6'  (1.829 Calderon)    Wt 135.3 kg    SpO2 96%    BMI 40.45 kg/Calderon   OBJECTIVE:  Constitutional: alert, well-appearing, in NAD HENT: normocephalic, atraumatic, mucous membranes  moist Eyes: conjunctiva non-erythematous, EOMI Cardiovascular: reproducible chest pain with palpation of left chest wall. CP does not appear to be pleuritic. He has some chest pain with movement of left arm. Non-edematous bilateral LE. Pulmonary/Chest: normal work of breathing on room air Neurological: A&O x 3, follows commands  Skin: warm and dry  Pertinent Labs, Studies, and Procedures:  CBC Latest Ref Rng & Units 10/17/2021 08/28/2020 02/23/2020  WBC 4.0 - 10.5 K/uL 7.9 8.2 7.3  Hemoglobin 13.0 - 17.0 g/dL 12.8(L) 11.9(L) 12.7(L)  Hematocrit 39.0 - 52.0 % 40.2 37.1(L) 40.2  Platelets 150 - 400 K/uL 121(L) 186 207    CMP Latest Ref Rng & Units 10/18/2021 10/17/2021 07/24/2021  Glucose 70 - 99 mg/dL 139(H) 156(H) 144(H)  BUN 8 - 23 mg/dL _0 Creatinine 0.61 - 1.24 mg/dL 1.67(H) 1.84(H) 1.73(H)  Sodium 135 - 145 mmol/L 137 138 139  Potassium 3.5 - 5.1 mmol/L 4.3 4.0 4.1  Chloride 98 - 111 mmol/L 101 102 98  CO2 22 - 32 mmol/L _1 Calcium 8.9 - 10.3 mg/dL 9.1 9.0 9.3  Total Protein 6.0 - 8.5 g/dL - - -  Total Bilirubin 0.0 - 1.2 mg/dL - - -  Alkaline Phos 44 - 121 IU/L - - -  AST 0 - 40 IU/L - - -  ALT 0 - 44 IU/L - - -    DG Chest 2 View  Result Date: 10/17/2021 CLINICAL DATA:  Cough, chest pain EXAM: CHEST - 2 VIEW COMPARISON:  08/28/2020 FINDINGS: Cardiomegaly. No confluent airspace opacities or effusions. No edema. No acute bony abnormality. IMPRESSION: Cardiomegaly.  No active disease. Electronically Signed   By: Rolm Baptise Calderon.D.   On: 10/17/2021 00:38   ECHOCARDIOGRAM COMPLETE  Result Date: 10/17/2021    ECHOCARDIOGRAM REPORT   Patient Name:   Juan Calderon Date of Exam: 10/17/2021 Medical Rec #:  [id number]      Height:       72.0 in Accession #:    [id number]     Weight:       305.8 lb Date of  Birth:  1956-07-21     BSA:          2.552 Calderon Patient Age:    1 years       BP:           131/72 mmHg Patient Gender: Calderon              HR:           57 bpm. Exam Location:  Inpatient Procedure: 2D Echo, Cardiac Doppler and Color Doppler Indications:    R07.9* Chest pain, unspecified  History:        Patient has prior history of Echocardiogram examinations, most                 recent 03/24/2019. CAD, COPD, Arrythmias:Bradycardia and Atrial                 Fibrillation; Risk Factors:ETOH, Hypertension, Dyslipidemia,                 Sleep Apnea and Diabetes.  Sonographer:    Bernadene Person RDCS Referring Phys: [place]  1. Left ventricular ejection fraction, by estimation, is 50 to 55%. The left ventricle has low normal function. Left ventricular endocardial border not optimally defined to evaluate regional wall motion. There is mild concentric left ventricular hypertrophy. Left ventricular diastolic parameters were normal.  2.  Right ventricular systolic function is normal. The right ventricular size is mildly enlarged. Tricuspid regurgitation signal is inadequate for assessing PA pressure.  3. The mitral valve is grossly normal. No evidence of mitral valve regurgitation. No evidence of mitral stenosis.  4. The aortic valve is tricuspid. Aortic valve regurgitation is not visualized. No aortic stenosis is present.  5. Aortic dilatation noted. There is moderate dilatation of the ascending aorta, measuring 42 mm. FINDINGS  Left Ventricle: Left ventricular ejection fraction, by estimation, is 50 to 55%. The left ventricle has low normal function. Left ventricular endocardial border not optimally defined to evaluate regional wall motion. The left ventricular internal cavity  size was normal in size. There is mild concentric left ventricular hypertrophy. Left ventricular diastolic parameters were normal. Right Ventricle: The right ventricular size is mildly enlarged. No increase in right  ventricular wall thickness. Right ventricular systolic function is normal. Tricuspid regurgitation signal is inadequate for assessing PA pressure. Left Atrium: Left atrial size was normal in size. Right Atrium: Right atrial size was normal in size. Pericardium: Trivial pericardial effusion is present. Presence of epicardial fat layer. Mitral Valve: The mitral valve is grossly normal. No evidence of mitral valve regurgitation. No evidence of mitral valve stenosis. Tricuspid Valve: The tricuspid valve is grossly normal. Tricuspid valve regurgitation is trivial. No evidence of tricuspid stenosis. Aortic Valve: The aortic valve is tricuspid. Aortic valve regurgitation is not visualized. No aortic stenosis is present. Pulmonic Valve: The pulmonic valve was grossly normal. Pulmonic valve regurgitation is not visualized. No evidence of pulmonic stenosis. Aorta: The aortic root is normal in size and structure and aortic dilatation noted. There is moderate dilatation of the ascending aorta, measuring 42 mm. IAS/Shunts: The atrial septum is grossly normal.  LEFT VENTRICLE PLAX 2D LVIDd:         5.30 cm      Diastology LVIDs:         2.90 cm      LV e' medial:    6.81 cm/s LV PW:         1.20 cm      LV E/e' medial:  8.9 LV IVS:        1.40 cm      LV e' lateral:   4.30 cm/s LVOT diam:     2.30 cm      LV E/e' lateral: 14.1 LV SV:         64 LV SV Index:   25 LVOT Area:     4.15 cm  LV Volumes (MOD) LV vol d, MOD A2C: 100.0 ml LV vol d, MOD A4C: 132.0 ml LV vol s, MOD A2C: 41.5 ml LV vol s, MOD A4C: 61.8 ml LV SV MOD A2C:     58.5 ml LV SV MOD A4C:     132.0 ml LV SV MOD BP:      65.2 ml RIGHT VENTRICLE RV S prime:     14.20 cm/s TAPSE (Calderon-mode): 2.2 cm LEFT ATRIUM             Index        RIGHT ATRIUM           Index LA diam:        4.20 cm 1.65 cm/Calderon   RA Area:     23.60 cm LA Vol (A2C):   74.0 ml 29.00 ml/Calderon  RA Volume:   72.20 ml  28.29 ml/Calderon LA Vol (A4C):   70.8 ml 27.74 ml/Calderon LA Biplane Vol:  76.4 ml 29.94 ml/Calderon  AORTIC  VALVE LVOT Vmax:   77.30 cm/s LVOT Vmean:  52.200 cm/s LVOT VTI:    0.153 Calderon  AORTA Ao Root diam: 3.80 cm Ao Asc diam:  4.20 cm MITRAL VALVE MV Area (PHT): 3.03 cm    SHUNTS MV Decel Time: 250 msec    Systemic VTI:  0.15 Calderon MV E velocity: 60.80 cm/s  Systemic Diam: 2.30 cm MV A velocity: 55.70 cm/s MV E/A ratio:  1.09 Eleonore Chiquito MD Electronically signed by Eleonore Chiquito MD Signature Date/Time: 10/17/2021/1:21:59 PM    Final       Lajean Manes, MD Internal Medicine Resident, PGY-1

## 2021-10-18 NOTE — Telephone Encounter (Signed)
Pt has appt on 11/14/21 with Coletta Memos for Pre-op assessment.  Will send to requesting office as FYI.Marland KitchenMarland Kitchen

## 2021-10-18 NOTE — Care Management Obs Status (Signed)
MEDICARE OBSERVATION STATUS NOTIFICATION   Patient Details  Name: Juan Calderon MRN: [id number] Date of Birth: 12-19-1955   Medicare Observation Status Notification Given:  Yes    Angelita Ingles, RN 10/18/2021, 11:23 AM

## 2021-10-18 NOTE — Discharge Instructions (Signed)
Juan Calderon you were admitted to [facility] because of chest pain. We did several labs and tests to rule out many life threatening things, as well as to determine the cause of your symptoms. This chest pain is NOT from a heart attack. This is not likely to be from your heart, and is more likely due to the muscles in your chest. This is  We treated you with tylenol, lidocaine patches, and Voltaren. You are improved significantly with this treatment. We also had the cardiologist come see you to rule out any heart related reason.   PLEASE continue to take your medications as prescribed by your doctor. But also, you can start taking tylenol 1000mg  every 8 hours and voltaren gel and lidocaine patch as needed until this chest pain resolves within the next 1-2 weeks. Please alternate between the voltaren gel and lidocaine patch. If the lidocaine patch is too expensive as a prescription, you may pick up over the counter version, which can be found under the name Solonpas or lidocaine patch.   PLEASE do not miss any doses of your medications as it is very important in ensuring you continue to feel better and remain stable.   You need to follow up with your primary care doctor Dr Evette Doffing on the date provided. Also follow up with Cardiology on appt day provided.   Call our clinic if you have similar or worsening symptoms, and do not feel like your normal self.

## 2021-10-18 NOTE — Plan of Care (Signed)
°  Problem: Education: Goal: Knowledge of General Education information will improve Description: Including pain rating scale, medication(s)/side effects and non-pharmacologic comfort measures 10/18/2021 1217 by Jae Dire, RN Outcome: Adequate for Discharge 10/18/2021 0745 by Jae Dire, RN Outcome: Progressing 10/18/2021 0745 by Jae Dire, RN Outcome: Progressing   Problem: Health Behavior/Discharge Planning: Goal: Ability to manage health-related needs will improve 10/18/2021 1217 by Jae Dire, RN Outcome: Adequate for Discharge 10/18/2021 0745 by Jae Dire, RN Outcome: Progressing   Problem: Clinical Measurements: Goal: Ability to maintain clinical measurements within normal limits will improve 10/18/2021 1217 by Jae Dire, RN Outcome: Adequate for Discharge 10/18/2021 0745 by Jae Dire, RN Outcome: Progressing Goal: Will remain free from infection Outcome: Adequate for Discharge Goal: Diagnostic test results will improve Outcome: Adequate for Discharge Goal: Respiratory complications will improve Outcome: Adequate for Discharge Goal: Cardiovascular complication will be avoided Outcome: Adequate for Discharge   Problem: Activity: Goal: Risk for activity intolerance will decrease Outcome: Adequate for Discharge   Problem: Nutrition: Goal: Adequate nutrition will be maintained Outcome: Adequate for Discharge   Problem: Coping: Goal: Level of anxiety will decrease Outcome: Adequate for Discharge   Problem: Elimination: Goal: Will not experience complications related to bowel motility Outcome: Adequate for Discharge Goal: Will not experience complications related to urinary retention Outcome: Adequate for Discharge   Problem: Pain Managment: Goal: General experience of comfort will improve Outcome: Adequate for Discharge   Problem: Safety: Goal: Ability to remain free from injury will improve Outcome: Adequate for  Discharge   Problem: Skin Integrity: Goal: Risk for impaired skin integrity will decrease Outcome: Adequate for Discharge

## 2021-10-18 NOTE — Progress Notes (Signed)
Placed patient on CPAP for the night via auto-mode.  

## 2021-10-18 NOTE — Hospital Course (Signed)
#  Atypical chest pain, likely MSK chest pain  #CAD #Chronic diastolic heart failure #GERD Presenting with dull 7/10 substernal chest pain that started suddenly when he rolled over in bed. CP radiated across his chest but did not go to his back or down either arm. ACS/cardiac chest pain ruled out, with flat trops 31 -> 33 -> 30, EKG without acute ST changes, and no improvement in CP with nitro. Echo without any acute changes, EF is 50-55% (EF 55-60% on 2020 echo) and no regional wall abnormalities. Less likely to be GERD given no improvement with Maalox and Protonix. Trivial pericardial effusion noted on echo; less likely to be pericarditis given exam findings and lack of pleuritic CP. Hx and exam findings point to MSK-related chest pain given reproducible chest pain (sometimes) and pain with palpation of the rib cage. He also has a hx of rotator cuff tendonitis of the left side and is planning to undergo surgery soon. He complains that CP is sometimes reproducible with movement of the left arm. Suspect ongoing pain related to rotator cuff is also contributing to his overall pain level. We treated him for MSK related chest pain with Tylenol 1g TID, Voltaren gel, and lidocaine patch. After observing him the night, he reported improvement in chest pain with current tx. He reported that the pain improved from a 7 to a 4. Suspect that pain will continue to improve with current tx and rotator cuff surgery.   - Tylenol 1g TID  - Voltaren gel - Lidocaine patch   - Continued home Protonix 40 mg daily - Continued home terazosin 5 mg daily  - Continued home torsemide 20 mg daily - Continuous telemetry   He is discharged with tylenol, voltaren gel and lidocaine patches. Close follow up with PCP Dr Evette Doffing and Cardiology. Cardiology was consulted during this admission for sinus brady (see below), and he is to continue following with them in the outpatient setting for possible stress test.    #Paroxysmal atrial  fibrillation #Nonsustained V. Tach, bradycardia, PVCs On lopressor, Flecainide, and Eliquis. Has hx of sinus brady, and given HR's in the 50's this admission, we initially held his Flecainide and Lopressor and consulted cardiology for assistance. Cardiology saw him and decided to continue metoprolol and flecainide with same dosing. He is to follow up with cardiology and EP.  - Cardiology consulted this admission  - Continued home Eliquis 5 mg twice daily   #HTN Normotensive this admission  -Continued home losartan 50 mg daily -Continued home Lopressor and torsemide as above   #HLD LDL 36 (Nov 2021)  -Continued home Lipitor 20 mg   #History of gout No current flare -Continued allopurinol 300 mg daily   #Severe OSA on CPAP #Obesity related restriction -CPAP nightly -Albuterol prn    #Chronic pain 2/2 OA of multiple joints -Continued Oxycodone 10 mg  -Tylenol   #Type 2 diabetes, well controlled A1c 07/2021 6.5%.  On metformin 1000 mg twice daily.  Blood sugars appropriate this admission. -Continued on metformin 1000 mg twice daily   #History of hypomagnesemia On chronic magnesium supplementation. Currently on maximum tolerated magnesium supplement. Repeat Mag 1.1 today. Improved to 1.6 with IV Mag 2g. IV Mag 4g given on day of discharge. Follow up with a repeat Mag level.    #CKD stage IIIb Chronic and stable -Avoided nephrotoxins

## 2021-10-18 NOTE — Progress Notes (Addendum)
Progress Note  Patient Name: Juan Calderon Date of Encounter: 10/18/2021  [facility] Cardiologist: Lauree Chandler, MD   Subjective   Feels well this AM  Inpatient Medications    Scheduled Meds:  acetaminophen  1,000 mg Oral TID   allopurinol  300 mg Oral Daily   apixaban  5 mg Oral BID   atorvastatin  20 mg Oral Daily   diclofenac Sodium  2 g Topical QID   flecainide  100 mg Oral Q12H   lidocaine  1 patch Transdermal Q24H   losartan  50 mg Oral Daily   metFORMIN  1,000 mg Oral BID WC   metoprolol tartrate  25 mg Oral BID   pantoprazole  40 mg Oral Daily   terazosin  5 mg Oral QHS   torsemide  20 mg Oral Daily   Continuous Infusions:  magnesium sulfate bolus IVPB     PRN Meds: albuterol, nitroGLYCERIN, oxyCODONE-acetaminophen   Vital Signs    Vitals:   10/17/21 2251 10/18/21 0111 10/18/21 0336 10/18/21 0755  BP:   123/69 135/70  Pulse:  81 (!) 53 (!) 55  Resp:  18 15 16   Temp:   97.7 F (36.5 C) 97.7 F (36.5 C)  TempSrc:   Oral   SpO2: 95% 96% 98% 96%  Weight:      Height:        Intake/Output Summary (Last 24 hours) at 10/18/2021 0851 Last data filed at 10/17/2021 1343 Gross per 24 hour  Intake 48.94 ml  Output --  Net 48.94 ml   Last 3 Weights 10/17/2021 10/17/2021 10/05/2021  Weight (lbs) 298 lb 4.5 oz 305 lb 305 lb 12.8 oz  Weight (kg) 135.3 kg 138.347 kg 138.71 kg      Telemetry    SB 50's - Personally Reviewed  ECG    No new ekgs - Personally Reviewed  Physical Exam   GEN: No acute distress.   Neck: No JVD Cardiac: RRR, no murmurs, rubs, or gallops.  Respiratory: CTA b/l. GI: Soft, nontender, non-distended  MS: No pitting edema. Neuro:  Nonfocal  Psych: Normal affect   Labs    High Sensitivity Troponin:   Recent Labs  Lab 10/17/21 0022 10/17/21 0221 10/17/21 0503  TROPONINIHS 31* 33* 30*     Chemistry Recent Labs  Lab 10/17/21 0022 10/17/21 0503 10/18/21 0046  NA 138  --   --   K 4.0  --   --   CL 102  --    --   CO2 26  --   --   GLUCOSE 156*  --   --   BUN 23  --   --   CREATININE 1.84*  --   --   CALCIUM 9.0  --   --   MG  --  1.1* 1.6*  GFRNONAA 40*  --   --   ANIONGAP 10  --   --     Lipids No results for input(s): CHOL, TRIG, HDL, LABVLDL, LDLCALC, CHOLHDL in the last 168 hours.  Hematology Recent Labs  Lab 10/17/21 0022  WBC 7.9  RBC 4.03*  HGB 12.8*  HCT 40.2  MCV 99.8  MCH 31.8  MCHC 31.8  RDW 14.3  PLT 121*   Thyroid No results for input(s): TSH, FREET4 in the last 168 hours.  BNPNo results for input(s): BNP, PROBNP in the last 168 hours.  DDimer No results for input(s): DDIMER in the last 168 hours.   Radiology  DG Chest 2 View Result Date: 10/17/2021 CLINICAL DATA:  Cough, chest pain EXAM: CHEST - 2 VIEW COMPARISON:  08/28/2020 FINDINGS: Cardiomegaly. No confluent airspace opacities or effusions. No edema. No acute bony abnormality. IMPRESSION: Cardiomegaly.  No active disease. Electronically Signed   By: Rolm Baptise M.D.   On: 10/17/2021 00:38    Cardiac Studies   10/17/21 TTE IMPRESSIONS   1. Left ventricular ejection fraction, by estimation, is 50 to 55%. The  left ventricle has low normal function. Left ventricular endocardial  border not optimally defined to evaluate regional wall motion. There is  mild concentric left ventricular  hypertrophy. Left ventricular diastolic parameters were normal.   2. Right ventricular systolic function is normal. The right ventricular  size is mildly enlarged. Tricuspid regurgitation signal is inadequate for  assessing PA pressure.   3. The mitral valve is grossly normal. No evidence of mitral valve  regurgitation. No evidence of mitral stenosis.   4. The aortic valve is tricuspid. Aortic valve regurgitation is not  visualized. No aortic stenosis is present.   5. Aortic dilatation noted. There is moderate dilatation of the ascending  aorta, measuring 42 mm.    03/24/2019: EPS/ablation (Dr. Lovena Le) CONCLUSIONS:   1. Isthmus-dependent right atrial flutter upon presentation.  2. Successful radiofrequency ablation of atrial flutter along the cavotricuspid isthmus with complete bidirectional isthmus block achieved.  3. No inducible arrhythmias following ablation.  4. No early apparent complications.    11/30/2014 lexiscan myoview Impression Exercise Capacity:  Lexiscan with no exercise. BP Response:  Normal blood pressure response. Clinical Symptoms:  There is dyspnea and chest pressure ECG Impression:  No significant ST segment change suggestive of ischemia. Comparison with Prior Nuclear Study: Compared to 05/10/12, no change.   Overall Impression:  Normal stress nuclear study.   LV Ejection Fraction:Study not gated. .  LV Wall Motion:  Study not gated due to atrial fibrillation; there appears to be significant LVE.  Patient Profile     66 y.o. male with a hx of chronic CHF (Diastolic), CAD (non-obstructive by cath in 2007, neg stress test 2016), persistent AFib, AFlutter (ablated 2020), COPD, HTN, HLD, gout, GERD, DM, chronic venous stasis, morbid obesity, CKD (III), OSA on CPAP admitted with CP.   Has has of recurrent osteomyelitis, 2017, 2019 (L femur) (MSSA) Chronic pain, chronic opioid use BMI 41.37   Afib Hx Diagnosed 2016 2020 developed AFlutter > ablated (CTI)   AAD  [place] remotely, poorly tolerated  Hx of digoxin stopped with rates 30's and BB was reduced 2016 BB was stopped 05/2016 ? Bradycardia >> resumed in 2017 after a CP episode Flecainide, 2016 (current)  Assessment & Plan    Paroxysmal Afib CHA2DS2Vasc is 4, on Eliquis, appropriately dosed Maintained for years on lopressor and flecainide Stable intervals Continue his metoprolol and flecainide, same dosing   CP HS Trop are Low flat, probably 2/2 CKD No ischemic EKG changes Neg myoview 2016 No obstructive disease cath 2007 By his description musculoskeletal, rasing up his L arm made the pain worse, turning from one  side to the other in bed/stretcher also provokes/made worse Palpation of the CW did not provoke, neither did ambulation Is resolved this AM Out patient follow up with out patient stress test recommended   Hypomagnesemia Home med list report mag ox 400mg  BID, he reports one of his PO doctors reduced his home dose, was on 800mg  he believes at one time BID Ongoing replacement and dosing for home as per IM  Dr. Lovena Le has seen the patient  this AM OK to discharger from our perspective Cardiology follow up is in place  For questions or updates, please contact [place] Please consult [email or web address] for contact info under     Signed, Baldwin [place], PA-C  10/18/2021, 8:51 AM    EP Attending  Patient seen and examined. Agree with the findings as noted above. The patient is better. Chest pain is resolved and enzymes are negative. Tele demonstrates Sinus brady. He can be discharged home with followup with Dr. Jearld Lesch. He will need his dose of magnesium replaced.  Carleene Overlie Gustavia Carie,MD

## 2021-10-19 NOTE — Telephone Encounter (Signed)
CBC shows Platelet count at 121.  (See previous note  Per office protocol, patient can hold Eliquis for 3 days prior to procedure.   Patient will not need bridging with Lovenox (enoxaparin) around procedure.  For orthopedic procedures please be sure to resume therapeutic (not prophylactic) dosing.

## 2021-10-19 NOTE — Telephone Encounter (Signed)
We have been asked for preop clearance for shoulder surgery. Pt has an appt with Coletta Memos on 3/14. Will defer clearance to him. I will remove from pool.

## 2021-10-23 ENCOUNTER — Other Ambulatory Visit: Payer: Self-pay | Admitting: Student in an Organized Health Care Education/Training Program

## 2021-10-23 DIAGNOSIS — Z79891 Long term (current) use of opiate analgesic: Secondary | ICD-10-CM

## 2021-10-23 MED ORDER — OXYCODONE-ACETAMINOPHEN 10-325 MG PO TABS: 1.0000 | ORAL_TABLET | Freq: Three times a day (TID) | ORAL | 0 refills | Status: DC | PRN

## 2021-10-23 NOTE — Telephone Encounter (Signed)
Refill Request   oxyCODONE-acetaminophen (PERCOCET) 10-325 MG tablet   [id number] [address], [place] - [place] (Ph: [phone])

## 2021-10-23 NOTE — Telephone Encounter (Signed)
Last [facility]ox[facility] 10/24/2020.  Next appt 10/30/2021.

## 2021-10-30 ENCOUNTER — Ambulatory Visit (INDEPENDENT_AMBULATORY_CARE_PROVIDER_SITE_OTHER): Payer: Medicare Other | Admitting: Student in an Organized Health Care Education/Training Program

## 2021-10-30 ENCOUNTER — Encounter: Payer: Self-pay | Admitting: Student in an Organized Health Care Education/Training Program

## 2021-10-30 VITALS — BP 144/68 | HR 58 | Temp 98.1°F | Ht 72.0 in | Wt 307.9 lb

## 2021-10-30 DIAGNOSIS — I10 Essential (primary) hypertension: Secondary | ICD-10-CM | POA: Diagnosis not present

## 2021-10-30 DIAGNOSIS — Z79891 Long term (current) use of opiate analgesic: Secondary | ICD-10-CM

## 2021-10-30 DIAGNOSIS — N521 Erectile dysfunction due to diseases classified elsewhere: Secondary | ICD-10-CM | POA: Diagnosis not present

## 2021-10-30 DIAGNOSIS — E1140 Type 2 diabetes mellitus with diabetic neuropathy, unspecified: Secondary | ICD-10-CM

## 2021-10-30 DIAGNOSIS — G4733 Obstructive sleep apnea (adult) (pediatric): Secondary | ICD-10-CM | POA: Diagnosis not present

## 2021-10-30 DIAGNOSIS — M7582 Other shoulder lesions, left shoulder: Secondary | ICD-10-CM | POA: Diagnosis not present

## 2021-10-30 DIAGNOSIS — M47812 Spondylosis without myelopathy or radiculopathy, cervical region: Secondary | ICD-10-CM | POA: Diagnosis not present

## 2021-10-30 LAB — POCT GLYCOSYLATED HEMOGLOBIN (HGB A1C): Hemoglobin A1C: 7.7 % — AB (ref 4.0–5.6)

## 2021-10-30 LAB — GLUCOSE, CAPILLARY: Glucose-Capillary: 230 mg/dL — ABNORMAL HIGH (ref 70–99)

## 2021-10-30 MED ORDER — OXYCODONE-ACETAMINOPHEN 10-325 MG PO TABS: 1.0000 | ORAL_TABLET | Freq: Three times a day (TID) | ORAL | 0 refills | Status: DC | PRN

## 2021-10-30 MED ORDER — LOSARTAN POTASSIUM 50 MG PO TABS: 50.0000 mg | ORAL_TABLET | Freq: Every day | ORAL | 3 refills | Status: DC

## 2021-10-30 NOTE — Assessment & Plan Note (Addendum)
Blood pressure well controlled today.  Patient noted at last admission to be on terazosin, this was started over 10 years ago to help with his blood pressure and some issues with sleep.  Now that the patient is over the age of 65 the risks of this medication probably outweigh the benefit.  Plan is to discontinue [place] and at this time.  Blood pressure is a little above goal today at 144/68.  I think he may have run out of losartan 50 mg sometime ago, no recent dispense in epic and I last prescribed this in 2021.  Will resend in a new refill for losartan 50 mg daily and follow-up his blood pressure in 3 months, can increase to 100 mg daily if needed.

## 2021-10-30 NOTE — Progress Notes (Signed)
Assessment and Plan:  See Encounters tab for problem-based medical decision making.   __________________________________________________________  HPI:   66 year old person living with hypertension, diabetes, severe obesity here for follow-up of recent hospitalization.  Patient was hospitalized for evaluation of chest pain earlier this month.  The pain resolved spontaneously without intervention.  Acute MI was ruled out with cardiac biomarkers and EKG.  He has the pain resolved and MI was able to be ruled out he was discharged without further ischemic evaluation.  He has done well since that time with no return of chest pain.  He is back to his usual exertional capacity which is limited due to obesity and deconditioning.  He is planning to go for a left rotator cuff surgical repair later this month.  He is also planning on having screening colonoscopy completed sometime in the next 1 to 2 months.  He has follow-up planned with his cardiologist on March 17 and anticipates they may do some kind of stress test.  He reports good adherence with his medications without adverse side effects.  Denies any falls.  Reports some issues with sleep, struggling to tolerate CPAP on a consistent basis.  He reports a flulike illness in late January and has had a persistent cough since that time which has been bothersome, no more nasal discharge or sinus pressure.  No fevers or chills.  __________________________________________________________  Problem List: Patient Active Problem List   Diagnosis Date Noted   Chronic use of opiate drug for therapeutic purpose 08/23/2017    Priority: High   Paroxysmal atrial fibrillation ([place]) 10/25/2015    Priority: High   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 ([place]) 04/25/2013    Priority: High   Severe obesity with body mass index (BMI) of 35.0 to 39.9 with comorbidity ([place]) 04/25/2013    Priority: High   Rotator cuff tendinitis, left  07/24/2021    Priority: Medium    Erectile dysfunction 01/23/2021    Priority: Medium    B12 deficiency 10/24/2020    Priority: Medium    Hypomagnesemia 12/28/2016    Priority: Medium    Obstructive sleep apnea 06/01/2013    Priority: Medium    Osteoarthritis cervical spine 04/25/2013    Priority: Medium    Hyperlipidemia 04/25/2013    Priority: Medium    Coronary artery disease involving native coronary artery with angina pectoris ([place]) 04/25/2013    Priority: Medium    COPD (chronic obstructive pulmonary disease) ([place]) 04/25/2013    Priority: Medium    Chronic diastolic heart failure ([employer]) 02/04/2012    Priority: Medium    Essential hypertension 09/20/2011    Priority: Medium    Tubular adenoma of colon 11/22/2017    Priority: Low   C6 radiculopathy 01/24/2016    Priority: Low   Constipation due to opioid therapy 12/24/2014    Priority: Low   Post-traumatic osteoarthritis of left knee 06/19/2013    Priority: Low   Gastroesophageal reflux disease without esophagitis 04/25/2013    Priority: Low   Open-angle glaucoma 04/25/2013    Priority: Low   Idiopathic chronic gout without tophus 04/25/2013    Priority: [place] maintenance 01/15/2013    Priority: Low    Medications: Reconciled today in Epic __________________________________________________________  Physical Exam:  Vital Signs: Vitals:   10/30/21 1038  BP: (!) 144/68  Pulse: (!) 58  Temp: 98.1 F (36.7 C)  TempSrc: Oral  SpO2: 97%  Weight: (!) 307 lb 14.4 oz (139.7 kg)  Height: 6' (1.829 m)    Gen: Well appearing, NAD ENT: OP clear without erythema or exudate, difficult to appreciate the posterior OP because of a large tongue CV: RRR, no murmurs Ext: Warm, no edema

## 2021-10-30 NOTE — Assessment & Plan Note (Signed)
Left rotator cuff tendinopathy being managed by Dr. Griffin Basil with orthopedics and planning for surgical rotator cuff repair in the coming months.  This is a intermediate risk surgery, patient has numerous comorbidities that are all fairly well controlled at this point.  RCRI score of 1 for history of congestive heart failure, however this is HFpEF and he has had no recent hospitalizations, appears euvolemic today.  He is limited on his exertional capacity due to issues with obesity and deconditioning.  Unable to accomplish 4 metabolic equivalents.  He has a follow-up with his cardiologist on 3/17.  Ischemic evaluation prior to the surgery would be optional, though I think he is fairly well optimized on his medication regimen.  We talked about holding apixaban 2 days prior to the surgery and resuming 1 day postoperative assuming no complications.

## 2021-10-30 NOTE — Assessment & Plan Note (Signed)
Patient with moderate obstructive sleep apnea due to obesity and struggling to tolerate CPAP consistently.  About me about the Inspire surgical device for treatment of sleep apnea.  I do not think he would qualify for this device because of his obesity.  We talked about some of the inclusion criteria for this.  I recommended continuing CPAP therapy for now, if he is able to improve his exertional capacity and have some weight loss and get his BMI under 40 may be more reasonable to refer to ENT at that time for a formal evaluation.

## 2021-10-30 NOTE — Assessment & Plan Note (Signed)
Globin A1c of 7.7% today which is worse than usual.  I think it is adequate given his degree of comorbidities.  We will continue with metformin for now.  Follow-up in 3 months.  If A1c continues to be over 7% on next recheck despite lifestyle interventions will probably add on an SGLT2 inhibitor.

## 2021-10-30 NOTE — Assessment & Plan Note (Signed)
Patient with cervical spine osteoarthritis in addition to low back pain and rotator cuff tendinitis.  He has difficulty with mobility due to these issues in addition to severe obesity and chronic osteoarthritis of his knees.  He is interested in using an upright walker, he has tried regular walkers in the past but has been unable to use them consistently because they forced him to slump over and put too much pressure on his left shoulder and neck.  I think an upright walker would be medically reasonable to help improve his mobility and would likely put less stress on his cervical arthritis as well as rotator cuff tendinitis.  We will order this today.

## 2021-11-07 ENCOUNTER — Encounter: Payer: Medicare Other | Admitting: Internal Medicine

## 2021-11-09 NOTE — Progress Notes (Unsigned)
Cardiology Office Note:    Date:  11/09/2021   ID:  Juan Calderon, DOB 12-30-1955, MRN [id number]  PCP:  Axel Filler, MD   [facility] Providers Cardiologist:  Lauree Chandler, MD Electrophysiologist:  [name], MD { Click to update primary MD,subspecialty MD or APP then REFRESH:1}    Referring MD: Axel Filler   Post hospital follow-up for chest pain  History of Present Illness:    Juan Calderon is a 66 y.o. male with a hx of atypical chest pain, chronic diastolic CHF, GERD, paroxysmal atrial fibrillation, NSVT, bradycardia, PVCs, hypertension, hyperlipidemia, GERD, OSA on CPAP, type 2 diabetes, CKD stage IIIb and chronic pain  He was admitted to the hospital on 10/17/2021 and discharged on 10/18/2021.  He presented with dull substernal chest pain.  He rated the pain a 7 out of 10.  He reported the pain that started suddenly when he rolled over in bed.  The pain radiated across his chest but did not go to his back or to either arm.  He was ruled out for ACS.  His troponins were flat 31-33-30.  His EKG showed no acute ST changes.  He was not noted to have improvement in his chest pain with nitroglycerin.  His echocardiogram showed EF of 50-55% and no regional wall motion abnormalities.  He was noted to have trivial pericardial effusion.  It was not felt to be pericarditis due to his lack of exam findings and lack of pleuritic chest pain.  He also did not note improvement with Maalox or Protonix.  His pain was felt to be related to musculoskeletal pain due to reproducibility during exam.  He also noted that he was planning to have rotator cuff surgery on his left arm.  He was noted to have reproducible pain with movement in his left arm.  It was felt that this was also contributing to his discomfort.  He received 1 g of Tylenol, Voltaren gel, and lidocaine patch.  He reported improvement with his chest discomfort.  He was asked to follow-up with his PCP and  cardiology as an outpatient.  He presents to the clinic today for follow-up evaluation states***  *** denies chest pain, shortness of breath, lower extremity edema, fatigue, palpitations, melena, hematuria, hemoptysis, diaphoresis, weakness, presyncope, syncope, orthopnea, and PND.   Past Medical History:  Diagnosis Date   Alcohol abuse     Bradycardia    C6 radiculopathy 01/24/2016   Right upper extremity, mild to moderate electrically by EMG on 01/24/2016   Cataract    Left eye   Chronic diastolic heart failure ([place])     with mild left ventricular hypertrophy on Echo 02/2010   Chronic obstructive pulmonary disease (HCC)     Chronic osteomyelitis of femur ([place]) 04/06/2016   Chronic osteomyelitis of left femur ([place]) 11/22/2017   Left femur s/p prior trauma   Chronic osteomyelitis of left femur ([place]) 11/22/2017   Brodie's abscess: left femur s/p prior trauma.  Underwent partial excision and curettage of left femoral osteomyelitis at [facility] 12/30/2017 with grossly purulent material encountered within the medullary canal of the left distal femur.  Cultures grew MSSA.  Post-operatively received 6 weeks of IV antibiotics through 02/10/2018.  CRP elevated at 60.3 at end of IV antibiotic course so continued on Keflex   Chronic pain syndrome     Left arm and leg s/p traumatic injury    Chronic renal insufficiency     Coronary artery disease  25% LAD stenosis on cath 2007.  Stable angina.   Diverticulosis     Diverticulosis 11/12/2013   Essential hypertension     Frequent PVCs    Gastroesophageal reflux disease     Gout     Hyperlipidemia LDL goal < 100     Internal hemorrhoids without complication 12/75/1700   Long-term current use of opiate analgesic 09/07/2016   Mild carpal tunnel syndrome of right wrist 01/24/2016   Mild degree electrically per EMG 01/24/2016    Mild carpal tunnel syndrome of right wrist 01/24/2016   Mild degree electrically per EMG 01/24/2016    Morbid obesity with BMI of  40.0-44.9, adult (HCC)     Normocytic anemia     NSVT (nonsustained ventricular tachycardia)    Obstructive sleep apnea     Moderate, AHI 29.8 per hour with moderately loud snoring and oxygen desaturation to a nadir of 79%. CPAP titration resulted in a prescription for 17 CWP.     Open-angle glaucoma     Osteoarthritis cervical spine     Osteoarthritis of left knee 06/19/2013   Tricompartmental disease.  Treated with double hinged upright knee brace, steroid/xylocaine knee injections, and NSAIDs    Osteoporosis 05/14/2017   s/p fracture of the right humerus from a fall at ground hight   Persistent atrial fibrillation (HCC)    Right rotator cuff tear     Large full-thickness tear of the supraspinatus with mild retraction but no atrophy    Right rotator cuff tear 04/25/2013   Large full-thickness tear of the supraspinatus with mild retraction but no atrophy     Secondary male hypogonadism 02/07/2017   Likely secondary to chronic opioid use   Secondary male hypogonadism 02/07/2017   Likely secondary to chronic opioid use   Subclinical hypothyroidism     Tubular adenoma of colon 11/22/2017   Specifics unknown.  Repeat colonoscopy 08/12/2018 with six 3-6 mm tubular adenomas removed endoscopically.   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 (HCC)     Vasomotor rhinitis 04/25/2013    Past Surgical History:  Procedure Laterality Date   A-FLUTTER ABLATION N/A 03/24/2019   Procedure: A-FLUTTER ABLATION;  Surgeon: [name], MD;  Location: [place] [facility];  Service: Cardiovascular;  Laterality: N/A;   CARDIOVERSION N/A 12/30/2014   Procedure: CARDIOVERSION;  Surgeon: [name], MD;  Location: [facility] ENDOSCOPY;  Service: Cardiovascular;  Laterality: N/A;   FRACTURE SURGERY Left 1980's   Elbow   Left arm surgery     Left leg surgery     SHOULDER SURGERY     Right    Current Medications: No outpatient medications have been marked as taking for the 11/14/21  encounter (Appointment) with Deberah Pelton, NP.     Allergies:   Ramipril, Tape, and Testosterone   Social History   Socioeconomic History   Marital status: Widowed    Spouse name: Not on file   Number of children: Not on file   Years of education: Not on file   Highest education level: Not on file  Occupational History   Occupation: Disabled  Tobacco Use   Smoking status: Never   Smokeless tobacco: Never  Vaping Use   Vaping Use: Never used  Substance and Sexual Activity   Alcohol use: Yes    Alcohol/week: 14.0 standard drinks    Types: 14 Cans of beer per week   Drug use: No   Sexual activity: Not Currently  Other Topics Concern  Not on file  Social History Narrative   Current Social History 12/14/2020      Patient lives with a friend in a home which is 1 story. There are 4 steps with handrails up to the entrance the patient uses.       Patient's method of transportation is personal car.      The highest level of education was high school diploma.      The patient currently disabled.      Identified important Relationships are "Family"       Pets : None       Interests / Fun: "Watch TV"       Current Stressors: "None"      Religious / Personal Beliefs: Forensic scientist, RN,BSN         Social Determinants of Health   Financial Resource Strain: Not on file  Food Insecurity: Not on file  Transportation Needs: Not on file  Physical Activity: Not on file  Stress: Not on file  Social Connections: Not on file     Family History: The patient's ***family history includes Alzheimer's disease in his father; Arthritis in his son; Asthma in his mother; Cancer in his brother, brother, and brother; Early death in his brother; Heart failure in his mother; Hypertension in his sister, sister, and sister; Prostate cancer in his brother. There is no history of Heart attack, Stroke, Colon cancer, Esophageal cancer, Pancreatic cancer, Stomach cancer, or Liver  disease.  ROS:   Please see the history of present illness.    *** All other systems reviewed and are negative.   Risk Assessment/Calculations:   {Does this patient have ATRIAL FIBRILLATION?[phone]}       Physical Exam:    VS:  There were no vitals taken for this visit.    Wt Readings from Last 3 Encounters:  10/30/21 (!) 307 lb 14.4 oz (139.7 kg)  10/17/21 298 lb 4.5 oz (135.3 kg)  10/05/21 (!) 305 lb 12.8 oz (138.7 kg)     GEN: *** Well nourished, well developed in no acute distress HEENT: Normal NECK: No JVD; No carotid bruits LYMPHATICS: No lymphadenopathy CARDIAC: ***RRR, no murmurs, rubs, gallops RESPIRATORY:  Clear to auscultation without rales, wheezing or rhonchi  ABDOMEN: Soft, non-tender, non-distended MUSCULOSKELETAL:  No edema; No deformity  SKIN: Warm and dry NEUROLOGIC:  Alert and oriented x 3 PSYCHIATRIC:  Normal affect    EKGs/Labs/Other Studies Reviewed:    The following studies were reviewed today:  Echocardiogram 10/17/2021  IMPRESSIONS     1. Left ventricular ejection fraction, by estimation, is 50 to 55%. The  left ventricle has low normal function. Left ventricular endocardial  border not optimally defined to evaluate regional wall motion. There is  mild concentric left ventricular  hypertrophy. Left ventricular diastolic parameters were normal.   2. Right ventricular systolic function is normal. The right ventricular  size is mildly enlarged. Tricuspid regurgitation signal is inadequate for  assessing PA pressure.   3. The mitral valve is grossly normal. No evidence of mitral valve  regurgitation. No evidence of mitral stenosis.   4. The aortic valve is tricuspid. Aortic valve regurgitation is not  visualized. No aortic stenosis is present.   5. Aortic dilatation noted. There is moderate dilatation of the ascending  aorta, measuring 42 mm.   EKG:  EKG is *** ordered today.  The ekg ordered today demonstrates ***  Recent  Labs: 10/17/2021: Hemoglobin 12.8; Platelets 121 10/18/2021: BUN  23; Creatinine, Ser 1.67; Magnesium 1.6; Potassium 4.3; Sodium 137  Recent Lipid Panel    Component Value Date/Time   CHOL 111 07/25/2020 1111   TRIG 142 07/25/2020 1111   HDL 51 07/25/2020 1111   CHOLHDL 2.2 07/25/2020 1111   CHOLHDL 2.0 03/23/2019 1030   VLDL 24 03/23/2019 1030   LDLCALC 36 07/25/2020 1111    ASSESSMENT & PLAN    Atypical chest pain/chest wall pain-seen and evaluated in the emergency department on 10/17/2021.  He was ruled out for ACS.  Troponins were flat.  Echocardiogram showed normal LVEF.  He was noted to have reproducible chest pain with deep palpation on exam.  His symptoms improved with Tylenol, Voltaren gel, and lidocaine.  He was also noted to have referred pain from his left shoulder and reported needing treatment for rotator cuff tendinitis. Reassured that his symptoms were not related to cardiac issues Continue Tylenol Rest area Proceed to treatment for rotator cuff  via orthopedics   Coronary artery disease-denies episodes of arm neck back and chest discomfort.  Underwent cardiac catheterization 2007 which showed nonobstructive CAD.  Had a negative stress test in 2016. Continue atorvastatin, losartan, metoprolol, nitroglycerin as needed Heart healthy low-sodium diet-salty 6 given Increase physical activity as tolerated  Paroxysmal atrial fibrillation-heart rate today***.  CHA2DS2-VASc score 4.  Reports compliance with apixaban and denies bleeding issues. Continue flecainide, metoprolol, apixaban Heart healthy low-sodium diet-salty 6 given Increase physical activity as tolerated Avoid triggers caffeine, chocolate, EtOH, dehydration etc.  Chronic diastolic CHF-weight today***.  Euvolemic.  No increased DOE or activity intolerance. Continue metoprolol, torsemide, losartan Heart healthy low-sodium diet Increase physical activity as tolerated Daily weights-contact office with a weight  increase of 2 pounds overnight or 5 pounds in 1 week  Hyperlipidemia-LDL*** Continue atorvastatin Heart healthy low-sodium high-fiber diet Increase physical activity as tolerated Follows with PCP  Disposition: Follow-up with Dr. Angelena Form or me in 4 to 6 months.  {Are you ordering a CV Procedure (e.g. stress test, cath, DCCV, TEE, etc)?   Press F2        :[phone]}    Medication Adjustments/Labs and Tests Ordered: Current medicines are reviewed at length with the patient today.  Concerns regarding medicines are outlined above.  No orders of the defined types were placed in this encounter.  No orders of the defined types were placed in this encounter.   There are no Patient Instructions on file for this visit.   Signed, Deberah Pelton, NP  11/09/2021 9:32 AM      Notice: This dictation was prepared with Dragon dictation along with smaller phrase technology. Any transcriptional errors that result from this process are unintentional and may not be corrected upon review.  I spent***minutes examining this patient, reviewing medications, and using patient centered shared decision making involving her cardiac care.  Prior to her visit I spent greater than 20 minutes reviewing her past medical history,  medications, and prior cardiac tests.

## 2021-11-14 ENCOUNTER — Other Ambulatory Visit: Payer: Self-pay

## 2021-11-14 ENCOUNTER — Encounter (HOSPITAL_BASED_OUTPATIENT_CLINIC_OR_DEPARTMENT_OTHER): Payer: Self-pay | Admitting: General Practice

## 2021-11-14 ENCOUNTER — Ambulatory Visit (INDEPENDENT_AMBULATORY_CARE_PROVIDER_SITE_OTHER): Payer: Medicare Other | Admitting: General Practice

## 2021-11-14 ENCOUNTER — Other Ambulatory Visit: Payer: Self-pay | Admitting: Internal Medicine

## 2021-11-14 VITALS — BP 118/82 | HR 59 | Ht 72.0 in | Wt 306.5 lb

## 2021-11-14 DIAGNOSIS — R0789 Other chest pain: Secondary | ICD-10-CM

## 2021-11-14 DIAGNOSIS — I2510 Atherosclerotic heart disease of native coronary artery without angina pectoris: Secondary | ICD-10-CM

## 2021-11-14 DIAGNOSIS — I5032 Chronic diastolic (congestive) heart failure: Secondary | ICD-10-CM

## 2021-11-14 DIAGNOSIS — I480 Paroxysmal atrial fibrillation: Secondary | ICD-10-CM

## 2021-11-14 NOTE — Patient Instructions (Signed)
Medication Instructions:  ?Your Physician recommend you continue on your current medication as directed.   ? ?*If you need a refill on your cardiac medications before your next appointment, please call your pharmacy* ? ?Follow-Up: ?At [facility], you and your health needs are our priority.  As part of our continuing mission to provide you with exceptional heart care, we have created designated Provider Care Teams.  These Care Teams include your primary Cardiologist (physician) and Advanced Practice Providers (APPs -  Physician Assistants and Nurse Practitioners) who all work together to provide you with the care you need, when you need it. ? ?We recommend signing up for the patient portal called "MyChart".  Sign up information is provided on this After Visit Summary.  MyChart is used to connect with patients for Virtual Visits (Telemedicine).  Patients are able to view lab/test results, encounter notes, upcoming appointments, etc.  Non-urgent messages can be sent to your provider as well.   ?To learn more about what you can do with MyChart, go to [email or web address].   ? ?Your next appointment:   ?Follow up as scheduled  ? ?Other Instructions ?Exercise recommendations: ?The American Heart Association recommends 150 minutes of moderate intensity exercise weekly. ?Try 30 minutes of moderate intensity exercise 4-5 times per week. ?This could include walking, jogging, or swimming. ? ?Recommend weighing daily and keeping a log. Please call our office if you have weight gain of 2 pounds overnight or 5 pounds in 1 week.  ? ?Date ? Time Weight  ? ?    ? ?    ? ?    ? ?    ? ?    ? ?    ? ?    ? ?    ? ? ? ? ? ?

## 2021-11-20 ENCOUNTER — Telehealth: Payer: Self-pay | Admitting: Student in an Organized Health Care Education/Training Program

## 2021-11-20 DIAGNOSIS — Z79891 Long term (current) use of opiate analgesic: Secondary | ICD-10-CM

## 2021-11-20 DIAGNOSIS — G4733 Obstructive sleep apnea (adult) (pediatric): Secondary | ICD-10-CM | POA: Diagnosis not present

## 2021-11-20 NOTE — Telephone Encounter (Signed)
Pt requesting a call back as to why his Pain medication was cut back. ? ? ?

## 2021-11-22 MED ORDER — OXYCODONE-ACETAMINOPHEN 10-325 MG PO TABS: 1.0000 | ORAL_TABLET | Freq: Four times a day (QID) | ORAL | 0 refills | Status: DC | PRN

## 2021-11-22 NOTE — Telephone Encounter (Signed)
Patient notified of new Rx. Since he already picked up the 90 tablets, insurance will not cover new Rx till next month. He is aware, and is appreciative of new Rx for q 6h. ? ?Patient is requesting a call back from [facility] [place]. ?

## 2021-11-22 NOTE — Telephone Encounter (Signed)
Returned call to patient. States pharmacist would only give him 90 tabs instead of the full 100 ordered. States it is due to Sig stating q8h PRN. States when "pain is cranked up" he takes 1 tab every 4-6 hours.  ?

## 2021-11-22 NOTE — Telephone Encounter (Signed)
Ok. I have sent new Rx with sig for q 6 hours prn. ?

## 2021-11-24 ENCOUNTER — Other Ambulatory Visit: Payer: Self-pay | Admitting: Student in an Organized Health Care Education/Training Program

## 2021-11-28 DIAGNOSIS — M47812 Spondylosis without myelopathy or radiculopathy, cervical region: Secondary | ICD-10-CM | POA: Diagnosis not present

## 2021-12-01 ENCOUNTER — Ambulatory Visit (AMBULATORY_SURGERY_CENTER): Payer: Medicare Other | Admitting: Internal Medicine

## 2021-12-01 ENCOUNTER — Encounter: Payer: Self-pay | Admitting: Internal Medicine

## 2021-12-01 VITALS — BP 138/78 | HR 54 | Temp 96.6°F | Resp 21 | Ht 72.0 in | Wt 305.0 lb

## 2021-12-01 DIAGNOSIS — Z86010 Personal history of colonic polyps: Secondary | ICD-10-CM

## 2021-12-01 MED ORDER — SODIUM CHLORIDE 0.9 % IV SOLN: 500.0000 mL | INTRAVENOUS | Status: DC
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
## 2021-12-01 NOTE — Op Note (Signed)
[place] ?Patient Name: Juan Calderon ?Procedure Date: 12/01/2021 10:09 AM ?MRN: [id number] ?Endoscopist: Docia Chuck. Henrene Pastor , MD ?Age: 66 ?Referring MD:  ?Date of Birth: 1955/12/10 ?Gender: Male ?Account #: [id number] ?Procedure:                Colonoscopy ?Indications:              High risk colon cancer surveillance: Personal  ?                          history of multiple (3 or more) adenomas, High risk  ?                          colon cancer surveillance: Personal history of  ?                          sessile serrated colon polyp (less than 10 mm in  ?                          size) with no dysplasia. Index examinations  ?                          elsewhere. Last examination here December 2019 with  ?                          6 polyps ?Medicines:                Monitored Anesthesia Care ?Procedure:                Pre-Anesthesia Assessment: ?                          - Prior to the procedure, a History and Physical  ?                          was performed, and patient medications and  ?                          allergies were reviewed. The patient's tolerance of  ?                          previous anesthesia was also reviewed. The risks  ?                          and benefits of the procedure and the sedation  ?                          options and risks were discussed with the patient.  ?                          All questions were answered, and informed consent  ?                          was obtained. Prior Anticoagulants: The patient has  ?  taken Eliquis (apixaban), last dose was 3 days  ?                          prior to procedure. ASA Grade Assessment: II - A  ?                          patient with mild systemic disease. After reviewing  ?                          the risks and benefits, the patient was deemed in  ?                          satisfactory condition to undergo the procedure. ?                          After obtaining informed consent, the colonoscope  ?                           was passed under direct vision. Throughout the  ?                          procedure, the patient's blood pressure, pulse, and  ?                          oxygen saturations were monitored continuously. The  ?                          Colonoscope was introduced through the anus and  ?                          advanced to the the cecum, identified by  ?                          appendiceal orifice and ileocecal valve. The  ?                          ileocecal valve, appendiceal orifice, and rectum  ?                          were photographed. The quality of the bowel  ?                          preparation was good. The colonoscopy was performed  ?                          without difficulty. The patient tolerated the  ?                          procedure well. The bowel preparation used was  ?                          SUPREP via split dose instruction. ?Scope In: 10:18:52 AM ?Scope Out: 10:38:14 AM ?Scope Withdrawal Time: 0 hours 13 minutes 50 seconds  ?  Total Procedure Duration: 0 hours 19 minutes 22 seconds  ?Findings:                 Multiple diverticula were found in the left colon  ?                          and right colon. ?                          Internal hemorrhoids were found during retroflexion. ?                          The exam was otherwise without abnormality on  ?                          direct and retroflexion views. ?Complications:            No immediate complications. Estimated blood loss:  ?                          None. ?Estimated Blood Loss:     Estimated blood loss: none. ?Impression:               - Diverticulosis in the left colon and in the right  ?                          colon. ?                          - Internal hemorrhoids. ?                          - The examination was otherwise normal on direct  ?                          and retroflexion views. ?                          - No specimens collected. ?Recommendation:           - Repeat colonoscopy in 5  years for surveillance. ?                          - Resume Eliquis (apixaban) today at prior dose. ?                          - Patient has a contact number available for  ?                          emergencies. The signs and symptoms of potential  ?                          delayed complications were discussed with the  ?                          patient. Return to normal activities tomorrow.  ?  Written discharge instructions were provided to the  ?                          patient. ?                          - Resume previous diet. ?                          - Continue present medications. ?Docia Chuck. Henrene Pastor, MD ?12/01/2021 10:45:00 AM ?This report has been signed electronically. ?

## 2021-12-01 NOTE — Progress Notes (Signed)
A and O x3. Report to RN. Tolerated MAC anesthesia well. 

## 2021-12-01 NOTE — Patient Instructions (Signed)
Handouts on hemorrhoids and diverticulosis. ?Repeat colonoscopy in 5 years for surveillance. ?Resume previous diet and continue present medications. ?Can resume Eliquis today at previous dose. ? ? ?YOU HAD AN ENDOSCOPIC PROCEDURE TODAY AT [facility]:   Refer to the procedure report that was given to you for any specific questions about what was found during the examination.  If the procedure report does not answer your questions, please call your gastroenterologist to clarify.  If you requested that your care partner not be given the details of your procedure findings, then the procedure report has been included in a sealed envelope for you to review at your convenience later. ? ?YOU SHOULD EXPECT: Some feelings of bloating in the abdomen. Passage of more gas than usual.  Walking can help get rid of the air that was put into your GI tract during the procedure and reduce the bloating. If you had a lower endoscopy (such as a colonoscopy or flexible sigmoidoscopy) you may notice spotting of blood in your stool or on the toilet paper. If you underwent a bowel prep for your procedure, you may not have a normal bowel movement for a few days. ? ?Please Note:  You might notice some irritation and congestion in your nose or some drainage.  This is from the oxygen used during your procedure.  There is no need for concern and it should clear up in a day or so. ? ?SYMPTOMS TO REPORT IMMEDIATELY: ? ?Following lower endoscopy (colonoscopy or flexible sigmoidoscopy): ? Excessive amounts of blood in the stool ? Significant tenderness or worsening of abdominal pains ? Swelling of the abdomen that is new, acute ? Fever of 100?F or higher ? ?For urgent or emergent issues, a gastroenterologist can be reached at any hour by calling [phone]. ?Do not use MyChart messaging for urgent concerns.  ? ? ?DIET:  We do recommend a small meal at first, but then you may proceed to your regular diet.  Drink plenty of fluids  but you should avoid alcoholic beverages for 24 hours. ? ?ACTIVITY:  You should plan to take it easy for the rest of today and you should NOT DRIVE or use heavy machinery until tomorrow (because of the sedation medicines used during the test).   ? ?FOLLOW UP: ?Our staff will call the number listed on your records 48-72 hours following your procedure to check on you and address any questions or concerns that you may have regarding the information given to you following your procedure. If we do not reach you, we will leave a message.  We will attempt to reach you two times.  During this call, we will ask if you have developed any symptoms of COVID 19. If you develop any symptoms (ie: fever, flu-like symptoms, shortness of breath, cough etc.) before then, please call [phone].  If you test positive for Covid 19 in the 2 weeks post procedure, please call and report this information to [place].   ? ?If any biopsies were taken you will be contacted by phone or by letter within the next 1-3 weeks.  Please call us at [phone] if you have not heard about the biopsies in 3 weeks.  ? ? ?SIGNATURES/CONFIDENTIALITY: ?You and/or your care partner have signed paperwork which will be entered into your electronic medical record.  These signatures attest to the fact that that the information above on your After Visit Summary has been reviewed and is understood.  Full responsibility of the confidentiality of this  discharge information lies with you and/or your care-partner.  ?

## 2021-12-05 ENCOUNTER — Telehealth: Payer: Self-pay | Admitting: *Deleted

## 2021-12-05 NOTE — Telephone Encounter (Signed)
?  Follow up Call- ? ? ?  12/01/2021  ?  9:14 AM 01/05/2020  ?  1:02 PM  ?Call back number  ?Post procedure Call Back phone  # [phone]  ?Permission to leave phone message Yes Yes  ?  ? ?Patient questions: ? ?Do you have a fever, pain , or abdominal swelling? No. ?Pain Score  0 * ? ?Have you tolerated food without any problems? Yes.   ? ?Have you been able to return to your normal activities? Yes.   ? ?Do you have any questions about your discharge instructions: ?Diet   No. ?Medications  No. ?Follow up visit  No. ? ?Do you have questions or concerns about your Care? No. ? ?Actions: ?* If pain score is 4 or above: ?No action needed, pain <4. ? ? ?

## 2022-01-02 ENCOUNTER — Ambulatory Visit (INDEPENDENT_AMBULATORY_CARE_PROVIDER_SITE_OTHER): Payer: Medicare Other | Admitting: Internal Medicine

## 2022-01-02 ENCOUNTER — Other Ambulatory Visit: Payer: Self-pay

## 2022-01-02 ENCOUNTER — Other Ambulatory Visit: Payer: Self-pay | Admitting: Internal Medicine

## 2022-01-02 ENCOUNTER — Encounter: Payer: Self-pay | Admitting: Internal Medicine

## 2022-01-02 DIAGNOSIS — I10 Essential (primary) hypertension: Secondary | ICD-10-CM

## 2022-01-02 DIAGNOSIS — W19XXXA Unspecified fall, initial encounter: Secondary | ICD-10-CM | POA: Insufficient documentation

## 2022-01-02 DIAGNOSIS — E7800 Pure hypercholesterolemia, unspecified: Secondary | ICD-10-CM

## 2022-01-02 DIAGNOSIS — N521 Erectile dysfunction due to diseases classified elsewhere: Secondary | ICD-10-CM

## 2022-01-02 MED ORDER — CYCLOBENZAPRINE HCL 5 MG PO TABS: 5.0000 mg | ORAL_TABLET | Freq: Every evening | ORAL | 0 refills | Status: DC | PRN

## 2022-01-02 NOTE — Patient Instructions (Signed)
Juan Calderon ? ?It was a pleasure seeing you in the clinic today.  ? ?We talked about your recent fall and the pain in your back. I will prescribe you a muscle relaxer which you can take nightly to help with the pain. If you pain worsens or you have further falls let us know and we would want to see you back in clinic.  ? ?Please call our clinic at [phone] if you have any questions or concerns. The best time to call is Monday-Friday from 9am-4pm, but there is someone available 24/7 at the same number. If you need medication refills, please notify your pharmacy one week in advance and they will send [place] a request. ?  ?Thank you for letting us take part in your care. We look forward to seeing you next time! ? ?

## 2022-01-02 NOTE — Progress Notes (Signed)
? ?CC: fall ? ?HPI: ? ?[name] is a 66 y.o. PMH noted below, who presents to the [facility] with complaints of fall. To see the management of his acute and chronic conditions, please refer to the A&P note under the encounters tab.  ? ?Past Medical History:  ?Diagnosis Date  ? A-fib ([place])   ? Alcohol abuse    ? Asthma   ? Bradycardia   ? C6 radiculopathy 01/24/2016  ? Right upper extremity, mild to moderate electrically by EMG on 01/24/2016  ? Cataract   ? Left eye  ? Chronic diastolic heart failure ([place])    ? with mild left ventricular hypertrophy on Echo 02/2010  ? Chronic obstructive pulmonary disease (HCC)    ? Chronic osteomyelitis of femur ([place]) 04/06/2016  ? Chronic osteomyelitis of left femur ([place]) 11/22/2017  ? Left femur s/p prior trauma  ? Chronic osteomyelitis of left femur ([place]) 11/22/2017  ? Brodie's abscess: left femur s/p prior trauma.  Underwent partial excision and curettage of left femoral osteomyelitis at [facility] 12/30/2017 with grossly purulent material encountered within the medullary canal of the left distal femur.  Cultures grew MSSA.  Post-operatively received 6 weeks of IV antibiotics through 02/10/2018.  CRP elevated at 60.3 at end of IV antibiotic course so continued on Keflex  ? Chronic pain syndrome    ? Left arm and leg s/p traumatic injury   ? Chronic renal insufficiency    ? Coronary artery disease    ? 25% LAD stenosis on cath 2007.  Stable angina.  ? Diverticulosis    ? Diverticulosis 11/12/2013  ? Essential hypertension    ? Frequent PVCs   ? Gastroesophageal reflux disease    ? Gout    ? Hyperlipidemia LDL goal < 100    ? Internal hemorrhoids without complication 50/27/7412  ? Long-term current use of opiate analgesic 09/07/2016  ? Mild carpal tunnel syndrome of right wrist 01/24/2016  ? Mild degree electrically per EMG 01/24/2016   ? Mild carpal tunnel syndrome of right wrist 01/24/2016  ? Mild degree electrically per EMG 01/24/2016   ? Morbid obesity with BMI of 40.0-44.9, adult ([place])     ? Normocytic anemia    ? NSVT (nonsustained ventricular tachycardia) ([place])   ? Obstructive sleep apnea    ? Moderate, AHI 29.8 per hour with moderately loud snoring and oxygen desaturation to a nadir of 79%. CPAP titration resulted in a prescription for 17 CWP.    ? Open-angle glaucoma    ? Osteoarthritis cervical spine    ? Osteoarthritis of left knee 06/19/2013  ? Tricompartmental disease.  Treated with double hinged upright knee brace, steroid/xylocaine knee injections, and NSAIDs   ? Osteoporosis 05/14/2017  ? s/p fracture of the right humerus from a fall at ground hight  ? Persistent atrial fibrillation ([place])   ? Right rotator cuff tear    ? Large full-thickness tear of the supraspinatus with mild retraction but no atrophy   ? Right rotator cuff tear 04/25/2013  ? Large full-thickness tear of the supraspinatus with mild retraction but no atrophy    ? Secondary male hypogonadism 02/07/2017  ? Likely secondary to chronic opioid use  ? Secondary male hypogonadism 02/07/2017  ? Likely secondary to chronic opioid use  ? Sleep apnea   ? Subclinical hypothyroidism    ? Tubular adenoma of colon 11/22/2017  ? Specifics unknown.  Repeat colonoscopy 08/12/2018 with six 3-6 mm tubular adenomas removed endoscopically.  ? 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 Vasomotor rhinitis 04/25/2013  ? ?Review of Systems:  positive for back pain, negative for shortness of breath, chest pain, dizziness, or lightheadedness, leg swelling ? ?Physical Exam: ?Gen: obese male uncomfortable but in NAD ?HEENT: normocephalic atraumatic, MMM ?CV: RRR, no m/r/g   ?Resp: CTAB, normal WOB  ?GI: soft, nontender ?MSK: normal strength in all extremities, deformity of the left lower leg, tenderness to palpation over the lateral thigh/hip, and right lumbar paraspinal tenderness ?Skin:warm and dry, no erythema, ecchymosis, skin change, or fluctuance over right leg ?Neuro:alert answering questions  appropriately ?Psych: normal affect ? ? ?Assessment & Plan:  ? ?See Encounters Tab for problem based charting. ? ?Patient discussed with Dr. Cain Sieve  ? ?

## 2022-01-02 NOTE — Assessment & Plan Note (Signed)
Patient is presenting after a fall on Saturday. He was putting something in his car trunk when he stepped backwards and caught his foot on the concrete and fell on his butt and then his back. Did not hit his head, did not lose consciousness, has been able to walk since then and denies any muscle weakness. He was not dizzy or lightheaded before the fall.  ? ?On exam there is no skin change, fluctuance, ecchymosis, or erythema. He has a normal straight leg raise test, normal strength, normal sensation, so signs or symptoms to suggest radiculopathy. He has tenderness of palpation over his lateral right hip and lumbar paraspinal muscles. Suspect he has some bruising and muscle strain of his lower back. Discussed with patient reassuring exam however he is worried given his history of other severe health issues and he does not want to miss something. Discussed starting with a muscle relaxer nightly and seeing if he improves. If his pain gets worse, he develops any weakness, or numbness that we would want to see him back in clinic. ? ?A/P ?-scheduled for f/u with PCP on 5/27 ?- 5 mg flexeril nightly ?- continue percocet ?

## 2022-01-02 NOTE — Assessment & Plan Note (Signed)
Patient would like to defer labs until he sees PCP/ ?- lipid panel at next visit.  ?

## 2022-01-02 NOTE — Assessment & Plan Note (Signed)
BP improved on recheck to 139/73, however patient in a moderate amount of pain today after recent fall which could be raising his BP. Will defer to PCP follow up in a couple of weeks to adjust medications if needed when not in acute pain. ? ?

## 2022-01-05 ENCOUNTER — Ambulatory Visit (INDEPENDENT_AMBULATORY_CARE_PROVIDER_SITE_OTHER): Payer: Medicare Other | Admitting: Cardiovascular Disease

## 2022-01-05 ENCOUNTER — Encounter: Payer: Self-pay | Admitting: Cardiovascular Disease

## 2022-01-05 VITALS — BP 110/62 | HR 65 | Ht 72.0 in | Wt 297.8 lb

## 2022-01-05 DIAGNOSIS — I2510 Atherosclerotic heart disease of native coronary artery without angina pectoris: Secondary | ICD-10-CM | POA: Diagnosis not present

## 2022-01-05 DIAGNOSIS — I10 Essential (primary) hypertension: Secondary | ICD-10-CM

## 2022-01-05 DIAGNOSIS — I480 Paroxysmal atrial fibrillation: Secondary | ICD-10-CM | POA: Diagnosis not present

## 2022-01-05 DIAGNOSIS — I5032 Chronic diastolic (congestive) heart failure: Secondary | ICD-10-CM | POA: Diagnosis not present

## 2022-01-05 NOTE — Progress Notes (Signed)
? ? ?  ? ? ?Date:  01/05/2022  ? ?ID:  Juan Calderon, DOB 03/10/1956, MRN [id number] ? ?PCP:  Axel Filler, MD  ?Cardiologist:  Lauree Chandler, MD Angelena Form ?Electrophysiologist:  [name], MD  ? ?Chief Complaint:   ?Chief Complaint  ?Patient presents with  ? Follow-up  ?  CAD  ? ? ?History of Present Illness:   ? ?66 yo male with history of chronic diastolic CHF, CAD, persistent atrial fibrillation, bradycardia, frequent PVCs, NSVT, COPD, HTN, HLD, gout, GERD, DM, chronic venous stasis, morbid obesity and chronic dyspnea who is here today for cardiac follow up. He had been followed by Dr. [name] At [facility] in [place], [place]. Echo June 2011 in [place], [place] with mild LVH, EF of 55%. mild LAE. Monitor April 2012 in [place], [place] showed NSR with episodes of sinus brady, rare PVCs, occasional PACs and brief episodes of atrial tachycardia. He had a stress myoview 2007 that showed reversible ischemia in the inferior wall. This led to a cardiac cath on 10/01/05 which showed 25% mid LAD stenosis per report but no other evidence of CAD.  Admitted October 2015 with volume overload and echo showed normal systolic function EF 49%. He diuresed down to his dry weight of 304 pounds. He was seen in March 2016 and had c/o dyspnea on exertion. Stress myoview 11/25/14 with no ischemia. 24 hour monitor showed atrial fib with bradycardia (rates as low as 40 bpm), frequent PVCs (9000 in 24 hours) and non-sustained VT. He has been followed by EP and was started on Flecainide. Atrial fib ablation in July 2020.  Echo July 2020 with LVEF=55-60%. No valve disease. He was admitted to [facility] 10/17/21 with chest pain. No evidence of ACS. Echo February 2023 with LVEF=50-55%. His pain was felt to be musculoskeletal. He was seen in our office for follow up on 11/14/21 and was doing well.  ? ?He is here today for follow up. The patient denies any chest pain, dyspnea, palpitations, lower extremity edema, orthopnea,  PND, dizziness, near syncope or syncope.  ?  ? ?Past Medical History:  ?Diagnosis Date  ? A-fib ([place])   ? Alcohol abuse    ? Asthma   ? Bradycardia   ? C6 radiculopathy 01/24/2016  ? Right upper extremity, mild to moderate electrically by EMG on 01/24/2016  ? Cataract   ? Left eye  ? Chronic diastolic heart failure ([employer])    ? with mild left ventricular hypertrophy on Echo 02/2010  ? Chronic obstructive pulmonary disease (HCC)    ? Chronic osteomyelitis of femur ([place]) 04/06/2016  ? Chronic osteomyelitis of left femur ([place]) 11/22/2017  ? Left femur s/p prior trauma  ? Chronic osteomyelitis of left femur ([place]) 11/22/2017  ? Brodie's abscess: left femur s/p prior trauma.  Underwent partial excision and curettage of left femoral osteomyelitis at [facility] 12/30/2017 with grossly purulent material encountered within the medullary canal of the left distal femur.  Cultures grew MSSA.  Post-operatively received 6 weeks of IV antibiotics through 02/10/2018.  CRP elevated at 60.3 at end of IV antibiotic course so continued on Keflex  ? Chronic pain syndrome    ? Left arm and leg s/p traumatic injury   ? Chronic renal insufficiency    ? Coronary artery disease    ? 25% LAD stenosis on cath 2007.  Stable angina.  ? Diverticulosis    ? Diverticulosis 11/12/2013  ? Essential hypertension    ? Frequent PVCs   ?  Gastroesophageal reflux disease    ? Gout    ? Hyperlipidemia LDL goal < 100    ? Internal hemorrhoids without complication 09/60/4540  ? Long-term current use of opiate analgesic 09/07/2016  ? Mild carpal tunnel syndrome of right wrist 01/24/2016  ? Mild degree electrically per EMG 01/24/2016   ? Mild carpal tunnel syndrome of right wrist 01/24/2016  ? Mild degree electrically per EMG 01/24/2016   ? Morbid obesity with BMI of 40.0-44.9, adult ([place])    ? Normocytic anemia    ? NSVT (nonsustained ventricular tachycardia) ([place])   ? Obstructive sleep apnea    ? Moderate, AHI 29.8 per hour with moderately loud snoring and oxygen  desaturation to a nadir of 79%. CPAP titration resulted in a prescription for 17 CWP.    ? Open-angle glaucoma    ? Osteoarthritis cervical spine    ? Osteoarthritis of left knee 06/19/2013  ? Tricompartmental disease.  Treated with double hinged upright knee brace, steroid/xylocaine knee injections, and NSAIDs   ? Osteoporosis 05/14/2017  ? s/p fracture of the right humerus from a fall at ground hight  ? Persistent atrial fibrillation ([place])   ? Right rotator cuff tear    ? Large full-thickness tear of the supraspinatus with mild retraction but no atrophy   ? Right rotator cuff tear 04/25/2013  ? Large full-thickness tear of the supraspinatus with mild retraction but no atrophy    ? Secondary male hypogonadism 02/07/2017  ? Likely secondary to chronic opioid use  ? Secondary male hypogonadism 02/07/2017  ? Likely secondary to chronic opioid use  ? Sleep apnea   ? Subclinical hypothyroidism    ? Tubular adenoma of colon 11/22/2017  ? Specifics unknown.  Repeat colonoscopy 08/12/2018 with six 3-6 mm tubular adenomas removed endoscopically.  ? 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 ([place])    ? Vasomotor rhinitis 04/25/2013  ? ?Past Surgical History:  ?Procedure Laterality Date  ? A-FLUTTER ABLATION N/A 03/24/2019  ? Procedure: A-FLUTTER ABLATION;  Surgeon: [name], MD;  Location: [facility];  Service: Cardiovascular;  Laterality: N/A;  ? CARDIOVERSION N/A 12/30/2014  ? Procedure: CARDIOVERSION;  Surgeon: [name], MD;  Location: [facility];  Service: Cardiovascular;  Laterality: N/A;  ? FRACTURE SURGERY Left 1980's  ? Elbow  ? Left arm surgery    ? Left leg surgery    ? SHOULDER SURGERY    ? Right  ?  ? ?Current Meds  ?Medication Sig  ? acetaminophen (TYLENOL) 500 MG tablet Take 2 tablets (1,000 mg total) by mouth 3 (three) times daily as needed.  ? albuterol (VENTOLIN HFA) 108 (90 Base) MCG/ACT inhaler INHALE 1-2 PUFFS INTO THE LUNGS EVERY 6 (SIX) HOURS AS NEEDED  FOR SHORTNESS OF BREATH.  ? allopurinol (ZYLOPRIM) 300 MG tablet TAKE 1 TABLET BY MOUTH EVERY DAY (Patient taking differently: Take 300 mg by mouth daily.)  ? atorvastatin (LIPITOR) 20 MG tablet TAKE 1 TABLET BY MOUTH EVERY DAY  ? Blood Glucose Monitoring Suppl (ONETOUCH VERIO) w/Device KIT 1 each by Does not apply route daily.  ? Calcium Citrate-Vitamin D 315-5 MG-MCG TABS Take 2 tablets by mouth daily.  ? [employer] VITAMIN B12 1000 MCG tablet TAKE 1 TABLET BY MOUTH EVERY DAY (Patient taking differently: Take 1,000 mcg by mouth daily.)  ? cyclobenzaprine (FLEXERIL) 5 MG tablet Take 1 tablet (5 mg total) by mouth at bedtime as needed for muscle spasms.  ? diclofenac Sodium (VOLTAREN) 1 %  GEL APPLY 2 GRAMS TO AFFECTED AREA 4 TIMES A DAY  ? ELIQUIS 5 MG TABS tablet TAKE 1 TABLET BY MOUTH TWICE A DAY (Patient taking differently: Take 5 mg by mouth 2 (two) times daily.)  ? flecainide (TAMBOCOR) 100 MG tablet TAKE 1 TABLET BY MOUTH EVERY 12 HOURS (Patient taking differently: Take 100 mg by mouth 2 (two) times daily.)  ? glucose blood (ONETOUCH VERIO) test strip Use as instructed  ? latanoprost (XALATAN) 0.005 % ophthalmic solution Place 1 drop into both eyes at bedtime.  ? lidocaine (LIDODERM) 5 % Place 1 patch onto the skin daily. Remove & Discard patch within 12 hours or as directed by MD  ? losartan (COZAAR) 50 MG tablet Take 1 tablet (50 mg total) by mouth daily.  ? magnesium oxide (MAG-OX) 400 (240 Mg) MG tablet TAKE 1 TABLET BY MOUTH TWICE A DAY  ? metFORMIN (GLUCOPHAGE) 1000 MG tablet TAKE 1 TABLET (1,000 MG TOTAL) BY MOUTH 2 (TWO) TIMES DAILY WITH A MEAL.  ? metoprolol tartrate (LOPRESSOR) 25 MG tablet TAKE 1 TABLET BY MOUTH TWICE A DAY (Patient taking differently: Take 25 mg by mouth 2 (two) times daily.)  ? nitroGLYCERIN (NITROSTAT) 0.4 MG SL tablet Place 1 tablet (0.4 mg total) under the tongue every 5 (five) minutes as needed for chest pain.  ? Olopatadine HCl 0.2 % SOLN Place 1 drop into both eyes daily.  ?  ONETOUCH DELICA LANCETS 03E MISC Use 1 strip daily  ? oxyCODONE-acetaminophen (PERCOCET) 10-325 MG tablet Take 1 tablet by mouth every 6 (six) hours as needed for pain.  ? pantoprazole (PROTONIX) 40 MG tablet Take 1 t

## 2022-01-05 NOTE — Patient Instructions (Signed)
Medication Instructions:  No changes *If you need a refill on your cardiac medications before your next appointment, please call your pharmacy*   Lab Work: none If you have labs (blood work) drawn today and your tests are completely normal, you will receive your results only by: MyChart Message (if you have MyChart) OR A paper copy in the mail If you have any lab test that is abnormal or we need to change your treatment, we will call you to review the results.   Testing/Procedures: none   Follow-Up: At [facility], you and your health needs are our priority.  As part of our continuing mission to provide you with exceptional heart care, we have created designated Provider Care Teams.  These Care Teams include your primary Cardiologist (physician) and Advanced Practice Providers (APPs -  Physician Assistants and Nurse Practitioners) who all work together to provide you with the care you need, when you need it.  We recommend signing up for the patient portal called "MyChart".  Sign up information is provided on this After Visit Summary.  MyChart is used to connect with patients for Virtual Visits (Telemedicine).  Patients are able to view lab/test results, encounter notes, upcoming appointments, etc.  Non-urgent messages can be sent to your provider as well.   To learn more about what you can do with MyChart, go to [email or web address].    Your next appointment:   12 month(s)  The format for your next appointment:   In Person  Provider:   Christopher McAlhany, MD     Other Instructions   Important Information About Sugar       

## 2022-01-08 ENCOUNTER — Emergency Department (HOSPITAL_COMMUNITY)
Admission: EM | Admit: 2022-01-08 | Discharge: 2022-01-08 | Disposition: A | Discharge status: Home / Self Care | Payer: Medicare Other | Attending: Emergency Medicine | Admitting: Emergency Medicine

## 2022-01-08 ENCOUNTER — Encounter (HOSPITAL_COMMUNITY): Payer: Self-pay | Admitting: Emergency Medicine

## 2022-01-08 ENCOUNTER — Emergency Department (HOSPITAL_COMMUNITY): Payer: Medicare Other

## 2022-01-08 DIAGNOSIS — Z7901 Long term (current) use of anticoagulants: Secondary | ICD-10-CM | POA: Insufficient documentation

## 2022-01-08 DIAGNOSIS — M1611 Unilateral primary osteoarthritis, right hip: Secondary | ICD-10-CM | POA: Diagnosis not present

## 2022-01-08 DIAGNOSIS — Y9301 Activity, walking, marching and hiking: Secondary | ICD-10-CM | POA: Insufficient documentation

## 2022-01-08 DIAGNOSIS — W010XXA Fall on same level from slipping, tripping and stumbling without subsequent striking against object, initial encounter: Secondary | ICD-10-CM | POA: Diagnosis not present

## 2022-01-08 DIAGNOSIS — M545 Low back pain, unspecified: Secondary | ICD-10-CM | POA: Insufficient documentation

## 2022-01-08 DIAGNOSIS — S79911A Unspecified injury of right hip, initial encounter: Secondary | ICD-10-CM | POA: Diagnosis not present

## 2022-01-08 DIAGNOSIS — M25551 Pain in right hip: Secondary | ICD-10-CM | POA: Insufficient documentation

## 2022-01-08 MED ORDER — DICLOFENAC SODIUM 1 % EX GEL: 4.0000 g | Freq: Four times a day (QID) | CUTANEOUS | 0 refills | Status: DC

## 2022-01-08 MED ORDER — OXYCODONE-ACETAMINOPHEN 5-325 MG PO TABS
1.0000 | ORAL_TABLET | Freq: Once | ORAL | Status: AC
Start: 2022-01-08 — End: 2022-01-08
  Administered 2022-01-08: 1 via ORAL
  Filled 2022-01-08: qty 1

## 2022-01-08 NOTE — Discharge Instructions (Signed)

## 2022-01-08 NOTE — ED Provider Notes (Signed)
?[facility] ?Provider Note ? ? ?CSN: [id number] ?Arrival date & time: 01/08/22  1250 ? ?  ? ?History ? ?Chief Complaint  ?Patient presents with  ? Fall  ? ? ?Juan Calderon is a 66 y.o. male. ? ? ?Fall ?Patient is a 66 year old male presented emergency room today with complaints of right-sided low back pain and right hip pain after suffering a fall 10 days ago he states that he tripped on an uneven piece of cement.  He initially told me that it was last Friday but after pulling up a calendar and showing him the dates he confirmed that it was 10 days ago that his fall occurred.  He states that he was walking backwards during the fall and states that when he tripped and he fell onto his buttocks.  He did not strike his head or lose consciousness.  Has had no nausea or vomiting.  Denies any middle low back pain and states it is more in his right side of his low back and also in his right hip. ? ?Patient does take Eliquis.  He confirms that over the past 10 days he has had no headaches nausea vomiting no other falls or injuries.  He confirms again that he has not struck his head. ? ? ?  ? ?Home Medications ?Prior to Admission medications   ?Medication Sig Start Date End Date Taking? Authorizing Provider  ?diclofenac Sodium (VOLTAREN) 1 % GEL Apply 4 g topically 4 (four) times daily. 01/08/22  Yes Pati Gallo S, PA  ?acetaminophen (TYLENOL) 500 MG tablet Take 2 tablets (1,000 mg total) by mouth 3 (three) times daily as needed. 10/18/21   Lajean Manes, MD  ?albuterol (VENTOLIN HFA) 108 (90 Base) MCG/ACT inhaler INHALE 1-2 PUFFS INTO THE LUNGS EVERY 6 (SIX) HOURS AS NEEDED FOR SHORTNESS OF BREATH. 09/28/21   Axel Filler, MD  ?allopurinol (ZYLOPRIM) 300 MG tablet TAKE 1 TABLET BY MOUTH EVERY DAY ?Patient taking differently: Take 300 mg by mouth daily. 03/23/21   Axel Filler, MD  ?atorvastatin (LIPITOR) 20 MG tablet TAKE 1 TABLET BY MOUTH EVERY DAY 05/03/21   Axel Filler, MD  ?Blood Glucose Monitoring Suppl (ONETOUCH VERIO) w/Device KIT 1 each by Does not apply route daily. 11/04/15   Loleta Chance, MD  ?Calcium Citrate-Vitamin D 315-5 MG-MCG TABS Take 2 tablets by mouth daily. 10/16/21   Axel Filler, MD  ?[facility] VITAMIN B12 1000 MCG tablet TAKE 1 TABLET BY MOUTH EVERY DAY ?Patient taking differently: Take 1,000 mcg by mouth daily. 06/16/21   Axel Filler, MD  ?cyclobenzaprine (FLEXERIL) 5 MG tablet Take 1 tablet (5 mg total) by mouth at bedtime as needed for muscle spasms. 01/02/22   Demaio, Alexa, MD  ?ELIQUIS 5 MG TABS tablet TAKE 1 TABLET BY MOUTH TWICE A DAY ?Patient taking differently: Take 5 mg by mouth 2 (two) times daily. 07/04/21   Axel Filler, MD  ?flecainide (TAMBOCOR) 100 MG tablet TAKE 1 TABLET BY MOUTH EVERY 12 HOURS ?Patient taking differently: Take 100 mg by mouth 2 (two) times daily. 06/05/21   Sid Falcon, MD  ?glucose blood (ONETOUCH VERIO) test strip Use as instructed 05/24/16   Oval Linsey, MD  ?latanoprost (XALATAN) 0.005 % ophthalmic solution Place 1 drop into both eyes at bedtime. 10/11/16   Oval Linsey, MD  ?lidocaine (LIDODERM) 5 % Place 1 patch onto the skin daily. Remove & Discard patch within 12 hours or as directed by  MD 10/18/21   Lajean Manes, MD  ?losartan (COZAAR) 50 MG tablet Take 1 tablet (50 mg total) by mouth daily. 10/30/21   Axel Filler, MD  ?magnesium oxide (MAG-OX) 400 (240 Mg) MG tablet TAKE 1 TABLET BY MOUTH TWICE A DAY 11/24/21   Axel Filler, MD  ?metFORMIN (GLUCOPHAGE) 1000 MG tablet TAKE 1 TABLET (1,000 MG TOTAL) BY MOUTH 2 (TWO) TIMES DAILY WITH A MEAL. 01/02/22   Axel Filler, MD  ?metoprolol tartrate (LOPRESSOR) 25 MG tablet TAKE 1 TABLET BY MOUTH TWICE A DAY ?Patient taking differently: Take 25 mg by mouth 2 (two) times daily. 03/30/21   Aldine Contes, MD  ?nitroGLYCERIN (NITROSTAT) 0.4 MG SL tablet Place 1 tablet (0.4 mg total) under the tongue every 5  (five) minutes as needed for chest pain. 07/18/18   Oval Linsey, MD  ?Olopatadine HCl 0.2 % SOLN Place 1 drop into both eyes daily.    [provider]  ?Jonetta Speak LANCETS 29N MISC Use 1 strip daily 05/24/16   Oval Linsey, MD  ?oxyCODONE-acetaminophen (PERCOCET) 10-325 MG tablet Take 1 tablet by mouth every 6 (six) hours as needed for pain. 11/22/21   Axel Filler, MD  ?pantoprazole (PROTONIX) 40 MG tablet Take 1 tablet (40 mg total) by mouth daily. 07/24/21   Axel Filler, MD  ?Sorbitol 70 % SOLN Take 15-60 mLs by mouth daily as needed (for constipation). 04/12/21   Axel Filler, MD  ?tadalafil (CIALIS) 10 MG tablet Take 1 tablet (10 mg total) by mouth as needed for erectile dysfunction. 01/23/21 01/23/22  Axel Filler, MD  ?torsemide (DEMADEX) 20 MG tablet TAKE 1 TABLET BY MOUTH EVERY DAY ?Patient taking differently: Take 20 mg by mouth daily. 01/27/21   Axel Filler, MD  ?Wound Dressings [facility] WOUND DRESSING) PADS Apply 10 each topically daily. 11/02/20   [name], MD  ?   ? ?Allergies    ?Ramipril, Tape, and Testosterone   ? ?Review of Systems   ?Review of Systems ? ?Physical Exam ?Updated Vital Signs ?BP 110/69   Pulse 62   Temp 97.7 ?F (36.5 ?C) (Oral)   Resp 15   Ht 6' (1.829 m)   Wt 135 kg   SpO2 97%   BMI 40.36 kg/m?  ?Physical Exam ?Vitals and nursing note reviewed.  ?Constitutional:   ?   General: He is not in acute distress. ?HENT:  ?   Head: Normocephalic and atraumatic.  ?   Comments: No evidence of trauma to head.  No scalp tenderness. ?   Nose: Nose normal.  ?Eyes:  ?   General: No scleral icterus. ?Cardiovascular:  ?   Rate and Rhythm: Normal rate and regular rhythm.  ?   Pulses: Normal pulses.  ?   Heart sounds: Normal heart sounds.  ?Pulmonary:  ?   Effort: Pulmonary effort is normal. No respiratory distress.  ?   Breath sounds: No wheezing.  ?Abdominal:  ?   Palpations: Abdomen is soft.  ?   Tenderness: There is no  abdominal tenderness.  ?Musculoskeletal:  ?   Cervical back: Normal range of motion.  ?   Right lower leg: No edema.  ?   Left lower leg: No edema.  ?   Comments: Right-sided paralumbar muscular tenderness.  No C-spine tenderness and no T-spine tenderness.  Right hip mildly tender with deep palpation.  Able to walk and seems to be experiencing no discomfort with ambulation.  No lower extremity tenderness.  ?  Skin: ?   General: Skin is warm and dry.  ?   Capillary Refill: Capillary refill takes less than 2 seconds.  ?Neurological:  ?   Mental Status: He is alert. Mental status is at baseline.  ?Psychiatric:     ?   Mood and Affect: Mood normal.     ?   Behavior: Behavior normal.  ? ? ?ED Results / Procedures / Treatments   ?Labs ?(all labs ordered are listed, but only abnormal results are displayed) ?Labs Reviewed - No data to display ? ?EKG ?None ? ?Radiology ?DG Lumbar Spine Complete ? ?Result Date: 01/08/2022 ?CLINICAL DATA:  Pain after fall last week right hip and back pain EXAM: LUMBAR SPINE - COMPLETE 4+ VIEW COMPARISON:  None Available. FINDINGS: There are 5 non-rib-bearing lumbar-type vertebral bodies. Vertebral body heights are preserved. There is no evidence of acute injury. There is mild levocurvature centered at L1-L2. There is no antero or retrolisthesis. There is no evidence of spondylolysis. There is mild disc space narrowing, degenerative endplate change, and facet arthropathy at L4-L5 and L5-S1. The SI joints are intact.  The soft tissues are unremarkable. IMPRESSION: 1. No evidence of acute injury in the lumbar spine. 2. Degenerative changes at L4-L5 and L5-S1 as above. Electronically Signed   By: Valetta Mole M.D.   On: 01/08/2022 13:32  ? ?DG Hip Unilat W or Wo Pelvis 2-3 Views Right ? ?Result Date: 01/08/2022 ?CLINICAL DATA:  Patient tripped and fell: Present with a pain at right hip and back EXAM: DG HIP (WITH OR WITHOUT PELVIS) 2-3V RIGHT COMPARISON:  None Available. FINDINGS: AP supine view of the  pelvis and two views of the right hip are obtained. No fracture or dislocation. Mild degenerative changes with mild loss of the bilateral hip joint space greater on the right. Prominent marginal osteophyte of the rig

## 2022-01-08 NOTE — ED Notes (Signed)
Pt verbalizes understanding of discharge instructions. Opportunity for questions and answers were provided. Pt discharged from the ED.   ?

## 2022-01-08 NOTE — ED Triage Notes (Signed)
Pt tripped and fell last week. Endorses pain to right hip and back.  ?

## 2022-01-08 NOTE — ED Provider Triage Note (Signed)
Emergency Medicine Provider Triage Evaluation Note ? ?Juan Calderon , a 66 y.o. male  was evaluated in triage.  Pt complains of right-sided lumbar back pain and right hip pain after suffering a fall.  Reports that he fell on Friday, 01/05/2022.  Patient reports that he was backing up when he lost his balance tripped and fell.  Patient reports landing on his buttocks.  Patient denies hitting his head or any loss of consciousness.  Patient has had continued pain to right hip and right lumbar back since then. ? ?Patient is on blood thinner Eliquis. ? ?Denies any numbness, weakness, saddle anesthesia, bowel/bladder dysfunction, facial asymmetry, dysarthria, syncope. ? ?Review of Systems  ?Positive: Right-sided lumbar back and hip pain ?Negative: See above ? ?Physical Exam  ?BP 108/67   Pulse 60   Temp 97.7 ?F (36.5 ?C) (Oral)   Resp 16   Ht 6' (1.829 m)   Wt 135 kg   SpO2 96%   BMI 40.36 kg/m?  ?Gen:   Awake, no distress   ?Resp:  Normal effort  ?MSK:   Moves extremities without difficulty  ?Other:  No midline tenderness or deformity to cervical, thoracic, or lumbar spine.  Tenderness to right lumbar back and diffuse tenderness to right hip. ? ?Medical Decision Making  ?Medically screening exam initiated at 1:00 PM.  Appropriate orders placed.  ODES LOLLI was informed that the remainder of the evaluation will be completed by another provider, this initial triage assessment does not replace that evaluation, and the importance of remaining in the ED until their evaluation is complete. ? ?Patient is adamant that he does not hit his head or have any loss of consciousness.  Will defer any CT imaging at this time.  Will obtain x-ray imaging of lumbar spine and right hip to look for acute osseous abnormality. ?  ?Loni Beckwith, PA-C ?01/08/22 1302 ? ?

## 2022-01-10 NOTE — Progress Notes (Signed)
Internal Medicine Clinic Attending ° °Case discussed with Dr. DeMaio  At the time of the visit.  We reviewed the resident’s history and exam and pertinent patient test results.  I agree with the assessment, diagnosis, and plan of care documented in the resident’s note. ° ° °

## 2022-01-15 ENCOUNTER — Other Ambulatory Visit: Payer: Self-pay | Admitting: Student in an Organized Health Care Education/Training Program

## 2022-01-15 DIAGNOSIS — Z79891 Long term (current) use of opiate analgesic: Secondary | ICD-10-CM

## 2022-01-15 MED ORDER — OXYCODONE-ACETAMINOPHEN 10-325 MG PO TABS: 1.0000 | ORAL_TABLET | Freq: Four times a day (QID) | ORAL | 0 refills | Status: DC | PRN

## 2022-01-15 NOTE — Telephone Encounter (Signed)
Refill Request ? ?oxyCODONE-acetaminophen (PERCOCET) 10-325 MG tablet ? ? ?[id number] [place] [place] [facility] [address]?

## 2022-01-22 ENCOUNTER — Encounter: Payer: Self-pay | Admitting: Student in an Organized Health Care Education/Training Program

## 2022-01-22 ENCOUNTER — Ambulatory Visit (INDEPENDENT_AMBULATORY_CARE_PROVIDER_SITE_OTHER): Payer: Medicare Other | Admitting: Student in an Organized Health Care Education/Training Program

## 2022-01-22 VITALS — BP 132/74 | HR 56 | Temp 98.1°F | Ht 72.0 in | Wt 305.9 lb

## 2022-01-22 DIAGNOSIS — N521 Erectile dysfunction due to diseases classified elsewhere: Secondary | ICD-10-CM | POA: Diagnosis not present

## 2022-01-22 DIAGNOSIS — E1140 Type 2 diabetes mellitus with diabetic neuropathy, unspecified: Secondary | ICD-10-CM

## 2022-01-22 DIAGNOSIS — E7800 Pure hypercholesterolemia, unspecified: Secondary | ICD-10-CM

## 2022-01-22 DIAGNOSIS — Z59819 Housing instability, housed unspecified: Secondary | ICD-10-CM | POA: Diagnosis not present

## 2022-01-22 DIAGNOSIS — G4733 Obstructive sleep apnea (adult) (pediatric): Secondary | ICD-10-CM | POA: Diagnosis not present

## 2022-01-22 DIAGNOSIS — I10 Essential (primary) hypertension: Secondary | ICD-10-CM

## 2022-01-22 DIAGNOSIS — Z79891 Long term (current) use of opiate analgesic: Secondary | ICD-10-CM | POA: Diagnosis not present

## 2022-01-22 LAB — POCT GLYCOSYLATED HEMOGLOBIN (HGB A1C): Hemoglobin A1C: 7 % — AB (ref 4.0–5.6)

## 2022-01-22 LAB — GLUCOSE, CAPILLARY: Glucose-Capillary: 121 mg/dL — ABNORMAL HIGH (ref 70–99)

## 2022-01-22 MED ORDER — OXYCODONE-ACETAMINOPHEN 10-325 MG PO TABS: 1.0000 | ORAL_TABLET | Freq: Four times a day (QID) | ORAL | 0 refills | Status: DC | PRN

## 2022-01-22 NOTE — Progress Notes (Signed)
Established Patient Office Visit  Subjective   Patient ID: Juan Calderon, male    DOB: 08/23/1956  Age: 66 y.o. MRN: [id number]   HPI  66 year old person here for follow up of hypertension and chronic pain. He is doing ok today. He had an ED visit a few weeks ago for low back pain after a fall, did not have an injury and was discharged. His back pain is back to normal now, normal functional status. He saw his cardiologist earlier in the month, no changes to medications. Big issue right now for him is that he lost his housing, his landlord sold his rental and he currently does not have a place of his own. He is staying with different family members, has been very stressful and disruptive, but he is working through it, thinks that he will have a place to stay by next month hopefully. No chest pain, or fevers. He has dyspnea with exertion, NYHA class II symptoms are stable. Reports good adherence with medicines, keeps them in a safe place with him all the time, no side effects.    Objective:     BP 132/74 (BP Location: Right Arm, Patient Position: Sitting, Cuff Size: Normal)   Pulse (!) 56   Temp 98.1 F (36.7 C) (Oral)   Ht 6' (1.829 m)   Wt (!) 305 lb 14.4 oz (138.8 kg)   SpO2 100%   BMI 41.49 kg/m    Physical Exam  Gen: Well appearing, no distress CV: regular rate and rhythm, no murmurs Abd: obese, soft, non tender Ext: 2+ chronic non pitting edema bilaterally Neuro: alert, conversational, full strength throughout    Assessment & Plan:   Problem List Items Addressed This Visit       High   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 (HCC) - Primary (Chronic)    Better control with A1c of 7.0%, at goal. Will plan to continue metformin '1000mg'$  bid which he is tolerating well. Continue with atorvastatin for secondary prevention of ASCVD. Continue with losartan for renal protection. Will check microalbumin today. Offered foot exam, patient deferred  to next visit.       Relevant Orders   POC Hbg A1C (Completed)   Lipid Profile   Microalbumin / Creatinine Urine Ratio   Chronic use of opiate drug for therapeutic purpose (Chronic)    Chronic pain generator of low back pain, one acute flare resulting in ED visit in early May. He is doing better now, back to usual functional status. He uses oxycodone '10mg'$  abour 4 times daily to improve function, which has been helping for many years. PDMP was appropriate. Will plan to check toxassure today, no issues in the past. I refilled oxycodone for 2 additional months.        Relevant Medications   oxyCODONE-acetaminophen (PERCOCET) 10-325 MG tablet   Other Relevant Orders   ToxAssure Select,+Antidepr,UR     Medium    Essential hypertension (Chronic)    Hypertension well controlled today. We stopped terazosin at last visit due to his advanced age, (66) and started losartan which he is tolerating well. BMP earlier in the month was normal.       Hyperlipidemia (Chronic)    Plan to recheck lipids today. Continue with atorvastatin '20mg'$  daily, this is secondary prevention of ASCVD.       Obstructive sleep apnea (Chronic)    Poor adherence to CPAP lately due to housing insecurity. Hopefully this will improve soon when he  finds more stable housing solution.         Unprioritized   Housing insecurity    Lost his rental apartment, currently staying with different family members on a rolling basis, which is disruptive for managing his chronic medical conditions. He reports keeping his medicines safe and organized. I reminded him that we do not refill opioids early. He is forgoeing CPAP for now. I have referred him to our case management and social work team to see if we can offer county resources for housing.       Relevant Orders   AMB Referral to [facility]    Return in about 3 months (around 04/24/2022).    Axel Filler, MD

## 2022-01-22 NOTE — Assessment & Plan Note (Signed)
Lost his rental apartment, currently staying with different family members on a rolling basis, which is disruptive for managing his chronic medical conditions. He reports keeping his medicines safe and organized. I reminded him that we do not refill opioids early. He is forgoeing CPAP for now. I have referred him to our case management and social work team to see if we can offer county resources for housing.

## 2022-01-22 NOTE — Assessment & Plan Note (Signed)
Poor adherence to CPAP lately due to housing insecurity. Hopefully this will improve soon when he finds more stable housing solution.

## 2022-01-22 NOTE — Assessment & Plan Note (Signed)
Plan to recheck lipids today. Continue with atorvastatin '20mg'$  daily, this is secondary prevention of ASCVD.

## 2022-01-22 NOTE — Assessment & Plan Note (Signed)
Hypertension well controlled today. We stopped terazosin at last visit due to his advanced age, and started losartan which he is tolerating well. BMP earlier in the month was normal.

## 2022-01-22 NOTE — Assessment & Plan Note (Signed)
Chronic pain generator of low back pain, one acute flare resulting in ED visit in early May. He is doing better now, back to usual functional status. He uses oxycodone '10mg'$  abour 4 times daily to improve function, which has been helping for many years. PDMP was appropriate. Will plan to check toxassure today, no issues in the past. I refilled oxycodone for 2 additional months.

## 2022-01-22 NOTE — Assessment & Plan Note (Signed)
Better control with A1c of 7.0%, at goal. Will plan to continue metformin '1000mg'$  bid which he is tolerating well. Continue with atorvastatin for secondary prevention of ASCVD. Continue with losartan for renal protection. Will check microalbumin today. Offered foot exam, patient deferred to next visit.

## 2022-01-23 ENCOUNTER — Telehealth: Payer: Self-pay

## 2022-01-23 LAB — LIPID PANEL
Chol/HDL Ratio: 2.2 ratio (ref 0.0–5.0)
Cholesterol, Total: 91 mg/dL — ABNORMAL LOW (ref 100–199)
HDL: 42 mg/dL (ref >39)
LDL Chol Calc (NIH): 22 mg/dL (ref 0–99)
Triglycerides: 164 mg/dL — ABNORMAL HIGH (ref 0–149)
VLDL Cholesterol Cal: 27 mg/dL (ref 5–40)

## 2022-01-23 LAB — MICROALBUMIN / CREATININE URINE RATIO
Creatinine, Urine: 189.1 mg/dL
Microalb/Creat Ratio: 11 mg/g creat (ref 0–29)
Microalbumin, Urine: 20.2 ug/mL

## 2022-01-23 NOTE — Telephone Encounter (Signed)
   Telephone encounter was:  Unsuccessful.  01/23/2022 Name: Juan Calderon MRN: [id number] DOB: 07/13/1956  Unsuccessful outbound call made today to assist with:   housing  Outreach Attempt:  1st Attempt  A HIPAA compliant voice message was left requesting a return call.  Instructed patient to call back at [phone].  Kila Godina, AAS Paralegal, [place] [employer]  [address], [place] [address] ??[email or web address]  ?? [phone]   [email or web address]

## 2022-01-25 ENCOUNTER — Other Ambulatory Visit: Payer: Self-pay | Admitting: Student in an Organized Health Care Education/Training Program

## 2022-01-25 LAB — TOXASSURE SELECT,+ANTIDEPR,UR

## 2022-01-25 MED ORDER — APIXABAN 5 MG PO TABS: 5.0000 mg | ORAL_TABLET | Freq: Two times a day (BID) | ORAL | 5 refills | Status: DC

## 2022-01-25 NOTE — Telephone Encounter (Signed)
Pt requesting a Pharmacy change for the following medication.  PT states he needs for the meds to go to   [id number] [place] [place] [facility] [address](Ph: [phone]  ELIQUIS 5 MG TABS tablet 60 tablet      Sig: TAKE 1 TABLET BY MOUTH TWICE A DAY   Patient taking differently: Take 5 mg by mouth 2 (two) times daily.

## 2022-01-26 ENCOUNTER — Telehealth: Payer: Self-pay

## 2022-01-26 NOTE — Telephone Encounter (Signed)
   Telephone encounter was:  Successful.  01/26/2022 Name: [name] MRN: [id number] DOB: April 11, 1956  [name] is a 66 y.o. year old male who is a primary care patient of Axel Filler, MD . The community resource team was consulted for assistance with  housing.  Care guide performed the following interventions: Spoke with patient he is staying with family and is ok and does not need emergency housing.  He asked that I mail housing resources to [address] [place], [place] [address]. Letter saved in Epic.          Follow Up Plan:  Care guide will follow up with patient by phone over the next 7 days.  Makiah Clauson, AAS Paralegal, [place] [employer]  [address], [place] [address] ??[email or web address]  ?? [phone]   [email or web address]

## 2022-01-28 ENCOUNTER — Other Ambulatory Visit: Payer: Self-pay | Admitting: Internal Medicine

## 2022-01-30 ENCOUNTER — Other Ambulatory Visit: Payer: Self-pay | Admitting: Student in an Organized Health Care Education/Training Program

## 2022-01-30 DIAGNOSIS — E1140 Type 2 diabetes mellitus with diabetic neuropathy, unspecified: Secondary | ICD-10-CM

## 2022-02-02 ENCOUNTER — Telehealth: Payer: Self-pay

## 2022-02-02 NOTE — Telephone Encounter (Signed)
   Telephone encounter was:  Unsuccessful.  02/02/2022 Name: Juan Calderon MRN: [id number] DOB: 1956-04-15  Unsuccessful outbound call made today to assist with:   housing.  Outreach Attempt:  2nd Attempt  A HIPAA compliant voice message was left requesting a return call.  Instructed patient to call back at [phone]. Left message on voicemail for patient to return my call regarding housing resources mailed.   Alicya Bena, AAS Paralegal, [place] [employer]  [address], [place] [address] ??[email or web address]  ?? [phone]   [email or web address]

## 2022-02-06 ENCOUNTER — Telehealth: Payer: Self-pay

## 2022-02-06 NOTE — Telephone Encounter (Signed)
   Telephone encounter was:  Successful.  02/06/2022 Name: Juan Calderon MRN: [id number] DOB: Jun 06, 1956  Juan Calderon is a 66 y.o. year old male who is a primary care patient of Axel Filler, MD . The community resource team was consulted for assistance with  housing.  Care guide performed the following interventions: Follow up call placed to the patient to discuss status of referral.  Follow Up Plan:  Spoke with patient, he has received the mailed resources for housing.  No further assistance is needed at this time.  Bradyn Vassey, AAS Paralegal, [place] [facility]  [address], [place] [address] ??[email or web address]  ?? [phone]   [email or web address]

## 2022-02-12 DIAGNOSIS — G4733 Obstructive sleep apnea (adult) (pediatric): Secondary | ICD-10-CM | POA: Diagnosis not present

## 2022-02-14 ENCOUNTER — Other Ambulatory Visit: Payer: Self-pay | Admitting: Student in an Organized Health Care Education/Training Program

## 2022-02-14 ENCOUNTER — Other Ambulatory Visit: Payer: Self-pay | Admitting: Internal Medicine

## 2022-02-14 DIAGNOSIS — M1A00X0 Idiopathic chronic gout, unspecified site, without tophus (tophi): Secondary | ICD-10-CM

## 2022-02-14 DIAGNOSIS — I480 Paroxysmal atrial fibrillation: Secondary | ICD-10-CM

## 2022-02-14 DIAGNOSIS — I5032 Chronic diastolic (congestive) heart failure: Secondary | ICD-10-CM

## 2022-02-14 NOTE — Telephone Encounter (Signed)
I would prefer for the refills of this medicine to be approved by cardiology, I believe he sees Dr. Angelena Form. Can we forward this to their office?

## 2022-02-27 ENCOUNTER — Telehealth: Payer: Self-pay | Admitting: Student in an Organized Health Care Education/Training Program

## 2022-02-27 NOTE — Telephone Encounter (Signed)
Pt call to inform his PCP (Dr. Oswaldo Done) that he will be bringing in a [employer].

## 2022-03-01 ENCOUNTER — Encounter: Payer: Self-pay | Admitting: Student in an Organized Health Care Education/Training Program

## 2022-03-15 ENCOUNTER — Other Ambulatory Visit: Payer: Self-pay | Admitting: Student in an Organized Health Care Education/Training Program

## 2022-03-15 DIAGNOSIS — G609 Hereditary and idiopathic neuropathy, unspecified: Secondary | ICD-10-CM

## 2022-03-17 ENCOUNTER — Other Ambulatory Visit: Payer: Self-pay | Admitting: Student in an Organized Health Care Education/Training Program

## 2022-03-17 DIAGNOSIS — N521 Erectile dysfunction due to diseases classified elsewhere: Secondary | ICD-10-CM

## 2022-03-19 ENCOUNTER — Other Ambulatory Visit: Payer: Self-pay

## 2022-03-19 DIAGNOSIS — Z79891 Long term (current) use of opiate analgesic: Secondary | ICD-10-CM

## 2022-03-19 MED ORDER — OXYCODONE-ACETAMINOPHEN 10-325 MG PO TABS: 1.0000 | ORAL_TABLET | Freq: Four times a day (QID) | ORAL | 0 refills | Status: DC | PRN

## 2022-03-19 NOTE — Telephone Encounter (Signed)
Last rx written 01/22/22. Last OV 01/22/22. Next OV 04/23/22. UDS 01/22/22.

## 2022-04-10 ENCOUNTER — Other Ambulatory Visit: Payer: Self-pay | Admitting: Student in an Organized Health Care Education/Training Program

## 2022-04-10 DIAGNOSIS — E1140 Type 2 diabetes mellitus with diabetic neuropathy, unspecified: Secondary | ICD-10-CM

## 2022-04-12 ENCOUNTER — Telehealth: Payer: Self-pay | Admitting: *Deleted

## 2022-04-12 NOTE — Telephone Encounter (Signed)
   Name: Juan Calderon  DOB: 01/04/1956  MRN: [id number]  Primary Cardiologist: Lauree Chandler, MD   Preoperative team, please contact this patient and set up a phone call appointment for further preoperative risk assessment. Please obtain consent and complete medication review. Thank you for your help.  Last OV 01/2022.  I confirm that guidance regarding antiplatelet and oral anticoagulation therapy has been completed and, if necessary, noted below.   Charlie Pitter, PA-C 04/12/2022, 5:18 PM [facility]

## 2022-04-12 NOTE — Telephone Encounter (Signed)
   Pre-operative Risk Assessment    Patient Name: Juan Calderon  DOB: 09-24-1955 MRN: [id number]      Request for Surgical Clearance    Procedure:   LEFT SHOULDER SCOPE, ROTATOR CUFF REPAIR  Date of Surgery:  Clearance TBD                                 Surgeon:  Ophelia Charter, MD Surgeon's Group or Practice Name:  [facility] Phone number:  [phone] Fax number:  [phone]   Type of Clearance Requested:   - Pharmacy:  Hold Apixaban (Eliquis) NOT INDICATED   Type of Anesthesia:  Not Indicated   Additional requests/questions:    [name]   04/12/2022, 1:47 PM

## 2022-04-12 NOTE — Telephone Encounter (Signed)
Will route to pharm for input then anticipate VV. Last OV 01/2022.

## 2022-04-12 NOTE — Telephone Encounter (Signed)
Patient with diagnosis of atrial fibrillation on Eliquis for anticoagulation.    Procedure: Left shoulder scope, rotator cuff repair  Date of procedure: TBD    CHA2DS2-VASc Score = 5  This indicates a 7.2% annual risk of stroke. The patient's score is based upon: CHF History: 1 HTN History: 1 Diabetes History: 1 Stroke History: 0 Vascular Disease History: 1 Age Score: 1 Gender Score: 0    CrCl 62 mL/min (Scr 1.67 10/18/21, using adjusted BW) Platelet count: 121K  Per office protocol, patient can hold Eliquis for 3 days prior to procedure.   Patient will not need bridging with Lovenox (enoxaparin) around procedure.  **This guidance is not considered finalized until pre-operative APP has relayed final recommendations.**

## 2022-04-13 ENCOUNTER — Telehealth: Payer: Self-pay | Admitting: *Deleted

## 2022-04-13 NOTE — Telephone Encounter (Signed)
Pt agreeable to plan of care for tele pre op appt 04/19/22 @ 2:20. Med rec and consent are done.

## 2022-04-13 NOTE — Telephone Encounter (Signed)
Pt agreeable to plan of care for tele pre op appt 04/19/22 @ 2:20. Med rec and consent are done.    Patient Consent for Virtual Visit        Juan Calderon has provided verbal consent on 04/13/2022 for a virtual visit (video or telephone).   CONSENT FOR VIRTUAL VISIT FOR:  Juan Calderon  By participating in this virtual visit I agree to the following:  I hereby voluntarily request, consent and authorize [facility] and its employed or contracted physicians, physician assistants, nurse practitioners or other licensed health care professionals (the Practitioner), to provide me with telemedicine health care services (the "Services") as deemed necessary by the treating Practitioner. I acknowledge and consent to receive the Services by the Practitioner via telemedicine. I understand that the telemedicine visit will involve communicating with the Practitioner through live audiovisual communication technology and the disclosure of certain medical information by electronic transmission. I acknowledge that I have been given the opportunity to request an in-person assessment or other available alternative prior to the telemedicine visit and am voluntarily participating in the telemedicine visit.  I understand that I have the right to withhold or withdraw my consent to the use of telemedicine in the course of my care at any time, without affecting my right to future care or treatment, and that the Practitioner or I may terminate the telemedicine visit at any time. I understand that I have the right to inspect all information obtained and/or recorded in the course of the telemedicine visit and may receive copies of available information for a reasonable fee.  I understand that some of the potential risks of receiving the Services via telemedicine include:  Delay or interruption in medical evaluation due to technological equipment failure or disruption; Information transmitted may not be sufficient (e.g. poor  resolution of images) to allow for appropriate medical decision making by the Practitioner; and/or  In rare instances, security protocols could fail, causing a breach of personal health information.  Furthermore, I acknowledge that it is my responsibility to provide information about my medical history, conditions and care that is complete and accurate to the best of my ability. I acknowledge that Practitioner's advice, recommendations, and/or decision may be based on factors not within their control, such as incomplete or inaccurate data provided by me or distortions of diagnostic images or specimens that may result from electronic transmissions. I understand that the practice of medicine is not an exact science and that Practitioner makes no warranties or guarantees regarding treatment outcomes. I acknowledge that a copy of this consent can be made available to me via my patient portal ([facility] [facility]), or I can request a printed copy by calling the office of [facility].    I understand that my insurance will be billed for this visit.   I have read or had this consent read to me. I understand the contents of this consent, which adequately explains the benefits and risks of the Services being provided via telemedicine.  I have been provided ample opportunity to ask questions regarding this consent and the Services and have had my questions answered to my satisfaction. I give my informed consent for the services to be provided through the use of telemedicine in my medical care

## 2022-04-19 ENCOUNTER — Telehealth: Payer: Self-pay | Admitting: *Deleted

## 2022-04-19 ENCOUNTER — Encounter: Payer: Self-pay | Admitting: Nurse Practitioner

## 2022-04-19 ENCOUNTER — Ambulatory Visit (INDEPENDENT_AMBULATORY_CARE_PROVIDER_SITE_OTHER): Payer: Medicare Other | Admitting: Nurse Practitioner

## 2022-04-19 DIAGNOSIS — I2510 Atherosclerotic heart disease of native coronary artery without angina pectoris: Secondary | ICD-10-CM

## 2022-04-19 DIAGNOSIS — R0609 Other forms of dyspnea: Secondary | ICD-10-CM

## 2022-04-19 DIAGNOSIS — Z01810 Encounter for preprocedural cardiovascular examination: Secondary | ICD-10-CM

## 2022-04-19 NOTE — Telephone Encounter (Signed)
Call placed to pt regarding surgical clearance and the need for a Lexiscan, per clearance phone note.  Pt has been made aware that the order has been placed and someone will call him to arrange.  Pt denied using mychart so verbally went over instructions below: Pt verbalized understanding.   You are scheduled for a Myocardial Perfusion Imaging Study   Please arrive 15 minutes prior to your appointment time for registration and insurance purposes.  The test will take approximately 3 to 4 hours to complete; you may bring reading material.  If someone comes with you to your appointment, they will need to remain in the main lobby due to limited space in the testing area. **If you are pregnant or breastfeeding, please notify the nuclear lab prior to your appointment**  How to prepare for your Myocardial Perfusion Test: Do not eat or drink 3 hours prior to your test, except you may have water. Do not consume products containing caffeine (regular or decaffeinated) 12 hours prior to your test. (ex: coffee, chocolate, sodas, tea). Do bring a list of your current medications with you.  If not listed below, you may take your medications as normal. Do wear comfortable clothes (no dresses or overalls) and walking shoes, tennis shoes preferred (No heels or open toe shoes are allowed). Do NOT wear cologne, perfume, aftershave, or lotions (deodorant is allowed). If these instructions are not followed, your test will have to be rescheduled.

## 2022-04-19 NOTE — Addendum Note (Signed)
Addended by: Gaetano Net on: 04/19/2022 03:07 PM   Modules accepted: Orders

## 2022-04-19 NOTE — Progress Notes (Signed)
Virtual Visit via Telephone Note   Because of Juan Calderon's co-morbid illnesses, he is at least at moderate risk for complications without adequate follow up.  This format is felt to be most appropriate for this patient at this time.  The patient did not have access to video technology/had technical difficulties with video requiring transitioning to audio format only (telephone).  All issues noted in this document were discussed and addressed.  No physical exam could be performed with this format.  Please refer to the patient's chart for his consent to telehealth for [facility].  Evaluation Performed:  Preoperative cardiovascular risk assessment _____________   Date:  04/19/2022   Patient ID:  Juan Calderon, DOB [birth year], MRN [id number] Patient Location:  Home Provider location:   Office  Primary Care Provider:  Axel Filler, MD Primary Cardiologist:  Lauree Chandler, MD  Chief Complaint / Patient Profile   66 y.o. y/o male with a h/o nonobstructive CAD, chronic diastolic heart failure, persistent atrial fibrillation, bradycardia, frequent PVCs, NSVT, hypertension, hyperlipidemia, type 2 diabetes, COPD, GERD, chronic venous stasis, gout, GERD, and obesity who is pending left shoulder scope/rotator cuff repair, date TBD, with Dr. Ophelia Charter of [facility] and presents today for telephonic preoperative cardiovascular risk assessment.  Past Medical History    Past Medical History:  Diagnosis Date   A-fib [facility])    Alcohol abuse     Asthma    Bradycardia    C6 radiculopathy 01/24/2016   Right upper extremity, mild to moderate electrically by EMG on 01/24/2016   Cataract    Left eye   Chronic diastolic heart failure ([place])     with mild left ventricular hypertrophy on Echo 02/2010   Chronic obstructive pulmonary disease (HCC)     Chronic osteomyelitis of femur ([place]) 04/06/2016   Chronic osteomyelitis of left femur ([place]) 11/22/2017   Left femur  s/p prior trauma   Chronic osteomyelitis of left femur ([place]) 11/22/2017   Brodie's abscess: left femur s/p prior trauma.  Underwent partial excision and curettage of left femoral osteomyelitis at [facility] 12/30/2017 with grossly purulent material encountered within the medullary canal of the left distal femur.  Cultures grew MSSA.  Post-operatively received 6 weeks of IV antibiotics through 02/10/2018.  CRP elevated at 60.3 at end of IV antibiotic course so continued on Keflex   Chronic pain syndrome     Left arm and leg s/p traumatic injury    Chronic renal insufficiency     Coronary artery disease     25% LAD stenosis on cath 2007.  Stable angina.   Diverticulosis     Diverticulosis 11/12/2013   Essential hypertension     Frequent PVCs    Gastroesophageal reflux disease     Gout     Hyperlipidemia LDL goal < 100     Internal hemorrhoids without complication 98/33/8250   Long-term current use of opiate analgesic 09/07/2016   Mild carpal tunnel syndrome of right wrist 01/24/2016   Mild degree electrically per EMG 01/24/2016    Mild carpal tunnel syndrome of right wrist 01/24/2016   Mild degree electrically per EMG 01/24/2016    Morbid obesity with BMI of 40.0-44.9, adult (HCC)     Normocytic anemia     NSVT (nonsustained ventricular tachycardia) (HCC)    Obstructive sleep apnea     Moderate, AHI 29.8 per hour with moderately loud snoring and oxygen desaturation to a nadir of 79%. CPAP titration resulted in a prescription for  17 CWP.     Open-angle glaucoma     Osteoarthritis cervical spine     Osteoarthritis of left knee 06/19/2013   Tricompartmental disease.  Treated with double hinged upright knee brace, steroid/xylocaine knee injections, and NSAIDs    Osteoporosis 05/14/2017   s/p fracture of the right humerus from a fall at [facility]   Persistent atrial fibrillation (HCC)    Right rotator cuff tear     Large full-thickness tear of the supraspinatus with mild retraction but no atrophy     Right rotator cuff tear 04/25/2013   Large full-thickness tear of the supraspinatus with mild retraction but no atrophy     Secondary male hypogonadism 02/07/2017   Likely secondary to chronic opioid use   Secondary male hypogonadism 02/07/2017   Likely secondary to chronic opioid use   Sleep apnea    Subclinical hypothyroidism     Tubular adenoma of colon 11/22/2017   Specifics unknown.  Repeat colonoscopy 08/12/2018 with six 3-6 mm tubular adenomas removed endoscopically.   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 (HCC)     Vasomotor rhinitis 04/25/2013   Past Surgical History:  Procedure Laterality Date   A-FLUTTER ABLATION N/A 03/24/2019   Procedure: A-FLUTTER ABLATION;  Surgeon: [name], MD;  Location: [facility];  Service: Cardiovascular;  Laterality: N/A;   CARDIOVERSION N/A 12/30/2014   Procedure: CARDIOVERSION;  Surgeon: [name], MD;  Location: [facility] ENDOSCOPY;  Service: Cardiovascular;  Laterality: N/A;   FRACTURE SURGERY Left 1980's   Elbow   Left arm surgery     Left leg surgery     SHOULDER SURGERY     Right    Allergies  Allergies  Allergen Reactions   Ramipril Swelling    Facial swelling   Tape Other (See Comments)    Irritates skin/ pls use paper tape   Testosterone Rash    History of Present Illness    Juan Calderon is a 66 y.o. male who presents via audio/video conferencing for a telehealth visit today.  Pt was last seen in cardiology clinic on 01/05/2022 by Dr. Angelena Form. At that time Juan Calderon was doing well. The patient is now pending procedure as outlined above. Since his last visit, he has been stable from a cardiac standpoint. He has chronic dyspnea on exertion, he is unable to complete >4 METS at baseline. He denies chest pain, palpitations, pnd, orthopnea, n, v, dizziness, syncope, edema, weight gain, or early satiety. All other systems reviewed and are otherwise negative except as noted  above.  Home Medications    Prior to Admission medications   Medication Sig Start Date End Date Taking? Authorizing Provider  acetaminophen (TYLENOL) 500 MG tablet Take 2 tablets (1,000 mg total) by mouth 3 (three) times daily as needed. 10/18/21   Lajean Manes, MD  albuterol (VENTOLIN HFA) 108 (90 Base) MCG/ACT inhaler INHALE 1-2 PUFFS INTO THE LUNGS EVERY 6 (SIX) HOURS AS NEEDED FOR SHORTNESS OF BREATH. 09/28/21   Axel Filler, MD  allopurinol (ZYLOPRIM) 300 MG tablet Take 1 tablet (300 mg total) by mouth daily. 02/14/22   Axel Filler, MD  apixaban (ELIQUIS) 5 MG TABS tablet Take 1 tablet (5 mg total) by mouth 2 (two) times daily. 01/25/22   Axel Filler, MD  atorvastatin (LIPITOR) 20 MG tablet TAKE 1 TABLET BY MOUTH EVERY DAY 05/03/21   Axel Filler, MD  Blood Glucose Monitoring Suppl [facility] VERIO) w/Device KIT 1  each by Does not apply route daily. 11/04/15   Loleta Chance, MD  Calcium Citrate-Vitamin D 315-5 MG-MCG TABS Take 2 tablets by mouth daily. 10/16/21   Axel Filler, MD  cyanocobalamin ([facility] VITAMIN B12) 1000 MCG tablet Take 1 tablet (1,000 mcg total) by mouth daily. 03/15/22   Axel Filler, MD  cyclobenzaprine (FLEXERIL) 5 MG tablet TAKE 1 TABLET BY MOUTH AT BEDTIME AS NEEDED FOR MUSCLE SPASMS. 01/30/22   Axel Filler, MD  diclofenac Sodium (VOLTAREN) 1 % GEL Apply 4 g topically 4 (four) times daily. 01/08/22   Tedd Sias, PA  flecainide (TAMBOCOR) 100 MG tablet Take 1 tablet (100 mg total) by mouth 2 (two) times daily. 02/19/22   Axel Filler, MD  glucose blood [facility] VERIO) test strip Use as instructed 05/24/16   Oval Linsey, MD  latanoprost (XALATAN) 0.005 % ophthalmic solution Place 1 drop into both eyes at bedtime. 10/11/16   Oval Linsey, MD  lidocaine (LIDODERM) 5 % Place 1 patch onto the skin daily. Remove & Discard patch within 12 hours or as directed by MD 10/18/21   Lajean Manes, MD  losartan  (COZAAR) 50 MG tablet Take 1 tablet (50 mg total) by mouth daily. 10/30/21   Axel Filler, MD  magnesium oxide (MAG-OX) 400 (240 Mg) MG tablet TAKE 1 TABLET BY MOUTH TWICE A DAY 11/24/21   Axel Filler, MD  metFORMIN (GLUCOPHAGE) 1000 MG tablet Take 1 tablet (1,000 mg total) by mouth 2 (two) times daily with a meal. 04/11/22   Axel Filler, MD  metoprolol tartrate (LOPRESSOR) 25 MG tablet TAKE 1 TABLET BY MOUTH TWICE A DAY Patient taking differently: Take 25 mg by mouth 2 (two) times daily. 03/30/21   Aldine Contes, MD  nitroGLYCERIN (NITROSTAT) 0.4 MG SL tablet Place 1 tablet (0.4 mg total) under the tongue every 5 (five) minutes as needed for chest pain. 07/18/18   Oval Linsey, MD  Olopatadine HCl 0.2 % SOLN Place 1 drop into both eyes daily.    [provider]  Jonetta Speak LANCETS 19T MISC Use 1 strip daily 05/24/16   Oval Linsey, MD  oxyCODONE-acetaminophen (PERCOCET) 10-325 MG tablet Take 1 tablet by mouth every 6 (six) hours as needed for pain. 03/19/22   Axel Filler, MD  pantoprazole (PROTONIX) 40 MG tablet Take 1 tablet (40 mg total) by mouth daily. 07/24/21   Axel Filler, MD  Sorbitol 70 % SOLN Take 15-60 mLs by mouth daily as needed (for constipation). 04/12/21   Axel Filler, MD  tadalafil (CIALIS) 10 MG tablet Take 1 tablet (10 mg total) by mouth as needed for erectile dysfunction. 01/23/21 01/23/22  Axel Filler, MD  torsemide (DEMADEX) 20 MG tablet Take 1 tablet (20 mg total) by mouth daily. 02/14/22   Axel Filler, MD  Wound Dressings [facility] WOUND DRESSING) PADS Apply 10 each topically daily. 11/02/20   [name], MD    Physical Exam    Vital Signs:  Benna Dunks does not have vital signs available for review today.  Given telephonic nature of communication, physical exam is limited. AAOx3. NAD. Normal affect.  Speech and respirations are unlabored.  Accessory Clinical Findings     None  Assessment & Plan    1.  Preoperative Cardiovascular Risk Assessment:  According to the Revised Cardiac Risk Index (RCRI), his Perioperative Risk of Major Cardiac Event is (%): 6.6. His Functional Capacity in METs is: 3.63 according to  the [facility] Activity Status Index (DASI).  Unable to complete > 4 METS at baseline.  [place] unavailable for consultation.  Discussed with Dr. Marlou Porch, DOD, who recommends nuclear stress test for ischemic evaluation prior to surgical clearance.  Discussed with recommendations with patient and he agrees to proceed.  However preop coverage team reach out to patient to schedule nuclear stress test.  If stress test negative for ischemia, patient can be cleared to proceed with surgery. Surgical clearance pending stress test results.  Shared Decision Making/Informed Consent The risks [chest pain, shortness of breath, cardiac arrhythmias, dizziness, blood pressure fluctuations, myocardial infarction, stroke/transient ischemic attack, nausea, vomiting, allergic reaction, radiation exposure, metallic taste sensation and life-threatening complications (estimated to be 1 in 10,000)], benefits (risk stratification, diagnosing coronary artery disease, treatment guidance) and alternatives of a nuclear stress test were discussed in detail with Mr. Rosinski and he agrees to proceed.  For future reference, if stress test negative and patient is cleared for surgery, per office protocol, patient can hold Eliquis for 3 days prior to procedure.  Patient will not need bridging with Lovenox (enoxaparin) around procedure.   A copy of this note will be routed to requesting surgeon.  Time:   Today, I have spent 11 minutes with the patient with telehealth technology discussing medical history, symptoms, and management plan.     Lenna Sciara, NP  04/19/2022, 2:51 PM

## 2022-04-20 ENCOUNTER — Other Ambulatory Visit: Payer: Self-pay | Admitting: Internal Medicine

## 2022-04-20 ENCOUNTER — Other Ambulatory Visit: Payer: Self-pay | Admitting: Student in an Organized Health Care Education/Training Program

## 2022-04-20 DIAGNOSIS — I25119 Atherosclerotic heart disease of native coronary artery with unspecified angina pectoris: Secondary | ICD-10-CM

## 2022-04-20 NOTE — Telephone Encounter (Signed)
Next appt scheduled 8/21 with PCP. 

## 2022-04-22 ENCOUNTER — Other Ambulatory Visit: Payer: Self-pay | Admitting: Student in an Organized Health Care Education/Training Program

## 2022-04-23 ENCOUNTER — Ambulatory Visit (INDEPENDENT_AMBULATORY_CARE_PROVIDER_SITE_OTHER): Payer: Medicare Other | Admitting: Student in an Organized Health Care Education/Training Program

## 2022-04-23 ENCOUNTER — Encounter: Payer: Self-pay | Admitting: Student in an Organized Health Care Education/Training Program

## 2022-04-23 ENCOUNTER — Other Ambulatory Visit: Payer: Self-pay

## 2022-04-23 VITALS — BP 139/64 | HR 50 | Temp 98.0°F | Ht 72.0 in | Wt 300.5 lb

## 2022-04-23 DIAGNOSIS — E1140 Type 2 diabetes mellitus with diabetic neuropathy, unspecified: Secondary | ICD-10-CM

## 2022-04-23 DIAGNOSIS — Z79891 Long term (current) use of opiate analgesic: Secondary | ICD-10-CM | POA: Diagnosis not present

## 2022-04-23 DIAGNOSIS — N521 Erectile dysfunction due to diseases classified elsewhere: Secondary | ICD-10-CM

## 2022-04-23 DIAGNOSIS — Z59819 Housing instability, housed unspecified: Secondary | ICD-10-CM | POA: Diagnosis not present

## 2022-04-23 DIAGNOSIS — N1831 Chronic kidney disease, stage 3a: Secondary | ICD-10-CM

## 2022-04-23 DIAGNOSIS — E1122 Type 2 diabetes mellitus with diabetic chronic kidney disease: Secondary | ICD-10-CM | POA: Diagnosis not present

## 2022-04-23 DIAGNOSIS — I10 Essential (primary) hypertension: Secondary | ICD-10-CM

## 2022-04-23 DIAGNOSIS — T402X5A Adverse effect of other opioids, initial encounter: Secondary | ICD-10-CM

## 2022-04-23 DIAGNOSIS — I129 Hypertensive chronic kidney disease with stage 1 through stage 4 chronic kidney disease, or unspecified chronic kidney disease: Secondary | ICD-10-CM | POA: Diagnosis not present

## 2022-04-23 DIAGNOSIS — Z0000 Encounter for general adult medical examination without abnormal findings: Secondary | ICD-10-CM

## 2022-04-23 LAB — GLUCOSE, CAPILLARY: Glucose-Capillary: 142 mg/dL — ABNORMAL HIGH (ref 70–99)

## 2022-04-23 LAB — POCT GLYCOSYLATED HEMOGLOBIN (HGB A1C): Hemoglobin A1C: 7 % — AB (ref 4.0–5.6)

## 2022-04-23 MED ORDER — OXYCODONE-ACETAMINOPHEN 10-325 MG PO TABS: 1.0000 | ORAL_TABLET | Freq: Four times a day (QID) | ORAL | 0 refills | Status: DC | PRN

## 2022-04-23 MED ORDER — SORBITOL 70 % RE SOLN
15.0000 mL | Freq: Every day | RECTAL | 5 refills | Status: DC | PRN
Start: 2022-04-23 — End: 2023-05-13

## 2022-04-23 NOTE — Assessment & Plan Note (Addendum)
Patient reports that he has not been able to secure permanent housing yet and is currently staying with someone. Patient tentatively plans to stay with his son starting in November but intends to continue care at [facility]. Patient has spoken with our case management team for housing resources but has not found it helpful for his situation. Patient is still unable to use CPAP for now. Patient reports that he is able fill his prescriptions and able to store his medications securely. We discussed that we cannot refill opioid medications if they are lost and patient expressed understanding.

## 2022-04-23 NOTE — Assessment & Plan Note (Addendum)
Creatinine in the last year 1.67-1.83. BMP today, recheck every 6 months.

## 2022-04-23 NOTE — Patient Instructions (Signed)
Thank you for seeing [place] today Juan Calderon! Today we discussed your diabetes and chronic pain. We did not make any changes to your medications today.   Labwork We will be checking your kidney function and electrolytes today, and we will call you with the results.   2. Diabetes Your Hba1C today was well-controlled at 7.0, which is the same as it was 3 months ago. We also did a foot exam today.    3. Medication Refills We have sent your medications to your pharmacy, please let us know if you have any issues.

## 2022-04-23 NOTE — Assessment & Plan Note (Signed)
Foot exam done today.

## 2022-04-23 NOTE — Assessment & Plan Note (Addendum)
Patient has chronic stable hypertension, managed with Losartan '50mg'$ . BP today was 155/67 and 139/64 on retake. BP well-controlled today, will continue to follow.

## 2022-04-23 NOTE — Progress Notes (Signed)
Attestation for Student Documentation:  I personally was present and performed or re-performed the history, physical exam and medical decision-making activities of this service and have verified that the service and findings are accurately documented in the student's note.  66 year old person here for follow up of hypertension, diabetes, and other comorbidities that are complicated by negative social determinants of health. His hypertension and diabetes are under excellent control with current medication regimen. He is scheduled for a stress test as part of preoperative risk assessment which seems reasonable given his low exertional capacity. We will continue his current medications, check BMP given CKD3a and magnesium given history of hypomag. I refilled three months of chronic opioids which he has been using for many years, he still reports they benefit his functional status on a daily basis, and he is having no negative side effects so far.   Axel Filler, MD 04/23/2022, 3:58 PM

## 2022-04-23 NOTE — Progress Notes (Signed)
Subjective:   Patient ID: Juan Calderon male   DOB: Jan 10, 1956 66 y.o.   MRN: [id number]  HPI: [name] is a 66 y.o. with a pmhx significant for T2DM, chronic pain, and CKD stage III who presents for follow-up of chronic conditions.   Patient reports that he is still having trouble securing permanent housing. Patient is currently staying with someone until November, and tentatively plans to stay with his son in the [place] area after. He reports that he is still able to access his medications and securely store them. Patient reports he is still unable to use his CPAP machine in his current housing.   Patient reports that he generally feels well and his chronic pain remains manageable on his current regimen. He reports occasional "itching" sensations in his feet which he attributes to diabetic neuropathy. He denies any recent illnesses.   Patient Active Problem List   Diagnosis Date Noted   Housing insecurity 01/22/2022   Chronic kidney disease (CKD), stage III (moderate) (HCC) 10/17/2021   Rotator cuff tendinitis, left 07/24/2021   Erectile dysfunction 01/23/2021   B12 deficiency 10/24/2020   Tubular adenoma of colon 11/22/2017   Chronic use of opiate drug for therapeutic purpose 08/23/2017   Hypomagnesemia 12/28/2016   C6 radiculopathy 01/24/2016   Paroxysmal atrial fibrillation ([place]) 10/25/2015   Constipation due to opioid therapy 12/24/2014   Post-traumatic osteoarthritis of left knee 06/19/2013   Obstructive sleep apnea 06/01/2013   Osteoarthritis cervical spine 04/25/2013   Gastroesophageal reflux disease without esophagitis 04/25/2013   Open-angle glaucoma 04/25/2013   Hyperlipidemia 04/25/2013   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 ([place]) 04/25/2013   Coronary artery disease involving native coronary artery with angina pectoris ([place]) 04/25/2013   COPD (chronic obstructive pulmonary disease) ([place]) 04/25/2013   Idiopathic  chronic gout without tophus 04/25/2013   Severe obesity with body mass index (BMI) of 35.0 to 39.9 with comorbidity ([place]) 04/25/2013   Healthcare maintenance 01/15/2013   Chronic diastolic heart failure ([place]) 02/04/2012   Essential hypertension 09/20/2011     Current Outpatient Medications  Medication Sig Dispense Refill   acetaminophen (TYLENOL) 500 MG tablet Take 2 tablets (1,000 mg total) by mouth 3 (three) times daily as needed. 30 tablet 0   albuterol (VENTOLIN HFA) 108 (90 Base) MCG/ACT inhaler INHALE 1-2 PUFFS INTO THE LUNGS EVERY 6 (SIX) HOURS AS NEEDED FOR SHORTNESS OF BREATH. 18 each 5   allopurinol (ZYLOPRIM) 300 MG tablet Take 1 tablet (300 mg total) by mouth daily. 90 tablet 3   apixaban (ELIQUIS) 5 MG TABS tablet Take 1 tablet (5 mg total) by mouth 2 (two) times daily. 60 tablet 5   atorvastatin (LIPITOR) 20 MG tablet TAKE 1 TABLET BY MOUTH EVERY DAY 90 tablet 3   Blood Glucose Monitoring Suppl (ONETOUCH VERIO) w/Device KIT 1 each by Does not apply route daily. 1 kit 0   Calcium Citrate-Vitamin D 315-5 MG-MCG TABS Take 2 tablets by mouth daily. 180 tablet 3   cyanocobalamin ([employer] VITAMIN B12) 1000 MCG tablet Take 1 tablet (1,000 mcg total) by mouth daily. 90 tablet 3   cyclobenzaprine (FLEXERIL) 5 MG tablet TAKE 1 TABLET BY MOUTH AT BEDTIME AS NEEDED FOR MUSCLE SPASMS. 30 tablet 0   diclofenac Sodium (VOLTAREN) 1 % GEL Apply 4 g topically 4 (four) times daily. 100 g 0   flecainide (TAMBOCOR) 100 MG tablet Take 1 tablet (100 mg total) by mouth 2 (two) times daily. [place]  tablet 2   glucose blood (ONETOUCH VERIO) test strip Use as instructed 100 each 12   latanoprost (XALATAN) 0.005 % ophthalmic solution Place 1 drop into both eyes at bedtime. 7.5 mL 2   lidocaine (LIDODERM) 5 % Place 1 patch onto the skin daily. Remove & Discard patch within 12 hours or as directed by MD 30 patch 0   losartan (COZAAR) 50 MG tablet Take 1 tablet (50 mg total) by mouth daily. 90 tablet 3   magnesium  oxide (MAG-OX) 400 (240 Mg) MG tablet TAKE 1 TABLET BY MOUTH TWICE A DAY 180 tablet 3   metFORMIN (GLUCOPHAGE) 1000 MG tablet Take 1 tablet (1,000 mg total) by mouth 2 (two) times daily with a meal. 180 tablet 3   metoprolol tartrate (LOPRESSOR) 25 MG tablet TAKE 1 TABLET BY MOUTH TWICE A DAY 180 tablet 3   nitroGLYCERIN (NITROSTAT) 0.4 MG SL tablet Place 1 tablet (0.4 mg total) under the tongue every 5 (five) minutes as needed for chest pain. 25 tablet 1   Olopatadine HCl 0.2 % SOLN Place 1 drop into both eyes daily.     ONETOUCH DELICA LANCETS 09M MISC Use 1 strip daily 100 each 5   oxyCODONE-acetaminophen (PERCOCET) 10-325 MG tablet Take 1 tablet by mouth every 6 (six) hours as needed for pain. 100 tablet 0   pantoprazole (PROTONIX) 40 MG tablet Take 1 tablet (40 mg total) by mouth daily. 90 tablet 3   Sorbitol 70 % SOLN Take 15-60 mLs by mouth daily as needed (for constipation). 473 mL 5   tadalafil (CIALIS) 10 MG tablet Take 1 tablet (10 mg total) by mouth as needed for erectile dysfunction. 30 tablet 1   torsemide (DEMADEX) 20 MG tablet Take 1 tablet (20 mg total) by mouth daily. 90 tablet 3   Wound Dressings (SORBSAN WOUND DRESSING) PADS Apply 10 each topically daily. 10 each 0   Current Facility-Administered Medications  Medication Dose Route Frequency Provider Last Rate Last Admin   0.9 %  sodium chloride infusion  500 mL Intravenous Continuous Irene Shipper, MD         Review of Systems: Pertinent items are noted in HPI.  Objective:   Physical Exam: Vitals:   04/23/22 1035 04/23/22 1146  BP: (!) 155/67 139/64  Pulse: (!) 53 (!) 50  Temp: 98 F (36.7 C)   TempSrc: Oral   SpO2: 97%   Weight: (!) 300 lb 8 oz (136.3 kg)   Height: 6' (1.829 m)    Gen: Alert and in no acute distress.  CV: RRR, no murmurs, rubs, or gallops.  Pulm: Normal respiratory effort. Lungs CTA bilaterally.  Abd: Normal bowel sounds. Abdomen is soft, non-tender, non-distended. No guarding.  Ext:  Distal pulses present in lower extremities.   Assessment & Plan:   Essential hypertension Patient has chronic stable hypertension, managed with Losartan 73m. BP today was 155/67 and 139/64 on retake. BP well-controlled today, will continue to follow.   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 (HCC) Patient has chronic stable diabetes managed with Metformin 10042mBID. Patient is also taking atorvastatin 2069mor secondary prevention of ASCVD. Hba1C today was 7.0, unchanged from three months ago. Last lipid panel and microalbumin/cr were reassuring. Foot exam done today. Will continue to monitor.    Housing insecurity Patient reports that he has not been able to secure permanent housing yet and is currently staying with someone. Patient tentatively plans to stay with his son starting in  November but intends to continue care at [facility]. Patient has spoken with our case management team for housing resources but has not found it helpful for his situation. Patient is still unable to use CPAP for now. Patient reports that he is able fill his prescriptions and able to store his medications securely. We discussed that we cannot refill opioid medications if they are lost and patient expressed understanding.   Chronic kidney disease (CKD), stage III (moderate) (HCC) Creatinine in the last year 1.67-1.83. BMP today, recheck every 6 months.   Hypomagnesemia Patient has chronic hypomagnesemia, taking magnesium oxide. Recheck magnesium today.   Chronic use of opiate drug for therapeutic purpose Patient has chronic pain due to osteoarthritis managed with oxycodone 47m 3-4 times daily. Patient endorses side effect of chronic constipation, which he manages with sorbitol. Patient reports that pain has remained stable and that oxycodone continues to improve functional status. Last Toxassure was appropriate. Refilled oxycodone for 3 months.  Healthcare maintenance Foot exam done  today.

## 2022-04-23 NOTE — Assessment & Plan Note (Signed)
Patient has chronic hypomagnesemia, taking magnesium oxide. Recheck magnesium today.   Addendum: Magnesium is low at 1.2 which has been a recurring issue for him. Currently asymptomatic, but needs to improve given his structural heart disease. Will plan to discontinue protonix which may be the culprit. Continue oral magnesium '400mg'$  bid for now. I called and discussed this with the patient.

## 2022-04-23 NOTE — Assessment & Plan Note (Signed)
Patient has chronic pain due to osteoarthritis managed with oxycodone '10mg'$  3-4 times daily. Patient endorses side effect of chronic constipation, which he manages with sorbitol. Patient reports that pain has remained stable and that oxycodone continues to improve functional status. Last Toxassure was appropriate. Refilled oxycodone for 3 months.

## 2022-04-23 NOTE — Assessment & Plan Note (Addendum)
Patient has chronic stable diabetes managed with Metformin '1000mg'$  BID. Patient is also taking atorvastatin '20mg'$  for secondary prevention of ASCVD. Hba1C today was 7.0, unchanged from three months ago. Last lipid panel and microalbumin/cr were reassuring. Foot exam done today. Will continue to monitor.

## 2022-04-24 LAB — BMP8+ANION GAP
Anion Gap: 17 mmol/L (ref 10.0–18.0)
BUN/Creatinine Ratio: 14 (ref 10–24)
BUN: 21 mg/dL (ref 8–27)
CO2: 22 mmol/L (ref 20–29)
Calcium: 9.1 mg/dL (ref 8.6–10.2)
Chloride: 100 mmol/L (ref 96–106)
Creatinine, Ser: 1.47 mg/dL — ABNORMAL HIGH (ref 0.76–1.27)
Glucose: 119 mg/dL — ABNORMAL HIGH (ref 70–99)
Potassium: 4.1 mmol/L (ref 3.5–5.2)
Sodium: 139 mmol/L (ref 134–144)
eGFR: 52 mL/min/{1.73_m2} — ABNORMAL LOW (ref >59)

## 2022-04-24 LAB — MAGNESIUM: Magnesium: 1.2 mg/dL — ABNORMAL LOW (ref 1.6–2.3)

## 2022-04-25 NOTE — Addendum Note (Signed)
Addended by: Lalla Brothers T on: 04/25/2022 09:21 AM   Modules accepted: Orders

## 2022-05-14 ENCOUNTER — Ambulatory Visit (HOSPITAL_COMMUNITY): Payer: Medicare Other

## 2022-05-14 ENCOUNTER — Telehealth: Payer: Self-pay | Admitting: Cardiovascular Disease

## 2022-05-14 NOTE — Telephone Encounter (Signed)
Spoke with pt who states he is waiting for scheduling of Lexiscan for surgical clearance and he has not been contacted yet regarding this.  Order has been placed and is awaiting scheduling.  Pt advised will have scheduler contact him with appointment.  Pt verbalizes understanding and agrees with current plan.

## 2022-05-14 NOTE — Telephone Encounter (Signed)
Pt calling stating he needs to talk to Dr. Angelena Form. Pt states it is about his upcoming shoulder surgery.

## 2022-05-15 ENCOUNTER — Ambulatory Visit (HOSPITAL_COMMUNITY): Payer: Medicare Other

## 2022-05-15 ENCOUNTER — Telehealth (HOSPITAL_COMMUNITY): Payer: Self-pay | Admitting: *Deleted

## 2022-05-15 NOTE — Telephone Encounter (Signed)
Patient given detailed instructions per Myocardial Perfusion Study Information Sheet for the test on 05/21/22 Patient notified to arrive 15 minutes early and that it is imperative to arrive on time for appointment to keep from having the test rescheduled.  If you need to cancel or reschedule your appointment, please call the office within 24 hours of your appointment. . Patient verbalized understanding. Kirstie Peri

## 2022-05-21 ENCOUNTER — Ambulatory Visit (HOSPITAL_COMMUNITY): Payer: Medicare Other | Attending: Nurse Practitioner

## 2022-05-21 DIAGNOSIS — R0609 Other forms of dyspnea: Secondary | ICD-10-CM

## 2022-05-21 DIAGNOSIS — Z01810 Encounter for preprocedural cardiovascular examination: Secondary | ICD-10-CM | POA: Diagnosis not present

## 2022-05-21 DIAGNOSIS — I2510 Atherosclerotic heart disease of native coronary artery without angina pectoris: Secondary | ICD-10-CM | POA: Diagnosis not present

## 2022-05-21 MED ORDER — TECHNETIUM TC 99M TETROFOSMIN IV KIT
32.8000 | PACK | Freq: Once | INTRAVENOUS | Status: AC | PRN
  Administered 2022-05-21: 32.8 via INTRAVENOUS

## 2022-05-21 MED ORDER — REGADENOSON 0.4 MG/5ML IV SOLN
0.4000 mg | Freq: Once | INTRAVENOUS | Status: AC
  Administered 2022-05-21: 0.4 mg via INTRAVENOUS

## 2022-05-22 ENCOUNTER — Ambulatory Visit (HOSPITAL_COMMUNITY): Payer: Medicare Other | Attending: Nurse Practitioner

## 2022-05-22 LAB — MYOCARDIAL PERFUSION IMAGING
LV dias vol: 137 mL (ref 62–150)
LV sys vol: 53 mL
Nuc Stress EF: 61 %
Rest Nuclear Isotope Dose: 30.5 mCi
SDS: 0
SRS: 0
SSS: 0
ST Depression (mm): 0 mm
Stress Nuclear Isotope Dose: 32.8 mCi
TID: 0.98

## 2022-05-22 MED ORDER — TECHNETIUM TC 99M TETROFOSMIN IV KIT
30.5000 | PACK | Freq: Once | INTRAVENOUS | Status: AC | PRN
  Administered 2022-05-22: 30.5 via INTRAVENOUS

## 2022-05-23 ENCOUNTER — Telehealth: Payer: Self-pay

## 2022-05-23 ENCOUNTER — Telehealth: Payer: Self-pay | Admitting: Cardiovascular Disease

## 2022-05-23 NOTE — Telephone Encounter (Signed)
Left a detailed message for pt sent to discuss stress test results and  Pt was advised to call back to our office with any questions or concerns.

## 2022-05-23 NOTE — Telephone Encounter (Signed)
No, I discussed pts results with him. Thanks.

## 2022-05-23 NOTE — Telephone Encounter (Signed)
Spoke with pt. Pt returned call. Pt was notified of stress test results and recommendations.

## 2022-05-23 NOTE — Telephone Encounter (Signed)
   Patient Name: Juan Calderon  DOB: Jan 24, 1956 MRN: [id number]  Primary Cardiologist: Lauree Chandler, MD  Recent stress test was negative for ischemia. Therefore, based on ACC/AHA guidelines, [name] would be at acceptable risk for the planned procedure without further cardiovascular testing.   Patient with diagnosis of atrial fibrillation on Eliquis for anticoagulation.     Procedure: Left shoulder scope, rotator cuff repair  Date of procedure: TBD      CHA2DS2-VASc Score = 5  This indicates a 7.2% annual risk of stroke. The patient's score is based upon: CHF History: 1 HTN History: 1 Diabetes History: 1 Stroke History: 0 Vascular Disease History: 1 Age Score: 1 Gender Score: 0     CrCl 62 mL/min (Scr 1.67 10/18/21, using adjusted BW) Platelet count: 121K   Per office protocol, patient can hold Eliquis for 3 days prior to procedure. Patient will not need bridging with Lovenox (enoxaparin) around procedure.  I will route this recommendation to the requesting party via Epic fax function and remove from pre-op pool.  Please call with questions.  Lenna Sciara, NP 05/23/2022, 8:27 AM

## 2022-05-23 NOTE — Telephone Encounter (Signed)
Pt is returning call in regards to results. Requesting call back.  

## 2022-05-27 ENCOUNTER — Other Ambulatory Visit: Payer: Self-pay | Admitting: Student in an Organized Health Care Education/Training Program

## 2022-05-27 ENCOUNTER — Other Ambulatory Visit: Payer: Self-pay | Admitting: Internal Medicine

## 2022-05-27 DIAGNOSIS — G609 Hereditary and idiopathic neuropathy, unspecified: Secondary | ICD-10-CM

## 2022-05-28 NOTE — Telephone Encounter (Signed)
Next appt scheduled 08/06/22 with PCP.[name]

## 2022-05-29 ENCOUNTER — Other Ambulatory Visit: Payer: Self-pay | Admitting: Student in an Organized Health Care Education/Training Program

## 2022-05-29 DIAGNOSIS — I480 Paroxysmal atrial fibrillation: Secondary | ICD-10-CM

## 2022-05-31 DIAGNOSIS — G4733 Obstructive sleep apnea (adult) (pediatric): Secondary | ICD-10-CM | POA: Diagnosis not present

## 2022-06-07 DIAGNOSIS — M170 Bilateral primary osteoarthritis of knee: Secondary | ICD-10-CM | POA: Diagnosis not present

## 2022-06-26 ENCOUNTER — Other Ambulatory Visit: Payer: Self-pay | Admitting: Student in an Organized Health Care Education/Training Program

## 2022-06-27 ENCOUNTER — Encounter (HOSPITAL_BASED_OUTPATIENT_CLINIC_OR_DEPARTMENT_OTHER): Payer: Self-pay | Admitting: Orthopaedic Surgery

## 2022-06-27 ENCOUNTER — Other Ambulatory Visit: Payer: Self-pay

## 2022-06-27 NOTE — Progress Notes (Signed)
   06/27/22 1154  PAT Phone Screen  Do You Have Diabetes? Yes  Do You Have Hypertension? Yes  Have You Ever Been to the ER for Asthma? No  Have You Taken Oral Steroids in the Past 3 Months? No  Do you Take Phenteramine or any Other Diet Drugs? No  Recent  Lab Work, EKG, CXR? Yes  Where was this test performed? EKG 11/14/2021  Do you have a history of heart problems? Yes;Cardiologist  Have You Ever Had Tests on Your Heart? Yes  Where? ECHO 10/17/21 E 50-55%; moderate aortic dilation; stress test 05/22/22  Any Recent Hospitalizations? No  Height 6' (1.829 m)  Weight (!) 136.1 kg  [facility] Appointment Scheduled Yes

## 2022-06-27 NOTE — Progress Notes (Signed)
Chart reviewed by Dr. Royce Macadamia. Ok to proceed with surgery as schedule at [facility].

## 2022-06-27 NOTE — Progress Notes (Deleted)
   06/27/22 1154  PAT Phone Screen  Do You Have Diabetes? Yes  Have You Ever Been to the ER for Asthma? No  Have You Taken Oral Steroids in the Past 3 Months? No  Do you Take Phenteramine or any Other Diet Drugs? No  Recent  Lab Work, EKG, CXR? Yes  Where was this test performed? EKG 11/14/2021  Do you have a history of heart problems? Yes;Cardiologist  Have You Ever Had Tests on Your Heart? Yes  Where? ECHO 10/17/21 E 50-55%; moderate aortic dilation; stress test 05/22/22  Any Recent Hospitalizations? No  Height 6' (1.829 m)  Weight (!) 136.1 kg  Juan Calderon Appointment Scheduled Yes

## 2022-07-03 ENCOUNTER — Encounter (HOSPITAL_BASED_OUTPATIENT_CLINIC_OR_DEPARTMENT_OTHER)
Admission: RE | Admit: 2022-07-03 | Discharge: 2022-07-03 | Disposition: A | Discharge status: Home / Self Care | Payer: Medicare Other | Source: Ambulatory Visit | Attending: Orthopaedic Surgery | Admitting: Orthopaedic Surgery

## 2022-07-03 DIAGNOSIS — Z01812 Encounter for preprocedural laboratory examination: Secondary | ICD-10-CM | POA: Insufficient documentation

## 2022-07-03 LAB — BASIC METABOLIC PANEL
Anion gap: 9 (ref 5–15)
BUN: 17 mg/dL (ref 8–23)
CO2: 25 mmol/L (ref 22–32)
Calcium: 9.1 mg/dL (ref 8.9–10.3)
Chloride: 108 mmol/L (ref 98–111)
Creatinine, Ser: 1.71 mg/dL — ABNORMAL HIGH (ref 0.61–1.24)
GFR, Estimated: 43 mL/min — ABNORMAL LOW (ref >60)
Glucose, Bld: 124 mg/dL — ABNORMAL HIGH (ref 70–99)
Potassium: 4.2 mmol/L (ref 3.5–5.1)
Sodium: 142 mmol/L (ref 135–145)

## 2022-07-03 NOTE — Progress Notes (Signed)
Surgical soap given with instructions, pt verbalized understanding.  Benzoyl peroxide gel given with written instructions, pt verbalized understanding.

## 2022-07-04 NOTE — H&P (Signed)
PREOPERATIVE H&P  Chief Complaint: right shoulder cartilage disorder, impingment syndrome,rotator cuff tear, bicep tendinitis.  HPI: Juan Calderon is a 66 y.o. male who is scheduled for, Procedure(s): SHOULDER ARTHROSCOPY WITH SUBACROMIAL DECOMPRESSION, ROTATOR CUFF REPAIR AND BICEP TENDON REPAIR SHOULDER ARTHROSCOPY WITH DISTAL CLAVICLE EXCISION.   Patient has a past medical history significant for atrial fibrillation, CHF, COPD, CKD, DM type 2.   Patient has had left shoulder pain for a quite some time.  It is getting worse.  It feels like his right shoulder did before his rotator cuff surgery.  He feels weak.  He has limited motion.  Pain radiates down into the elbow.  Symptoms are rated as moderate to severe, and have been worsening.  This is significantly impairing activities of daily living.    Please see clinic note for further details on this patient's care.    He has elected for surgical management.   Past Medical History:  Diagnosis Date   A-fib [facility])    Alcohol abuse     Asthma    Bradycardia    C6 radiculopathy 01/24/2016   Right upper extremity, mild to moderate electrically by EMG on 01/24/2016   Cataract    Left eye   Chronic diastolic heart failure ([place])     with mild left ventricular hypertrophy on Echo 02/2010   Chronic kidney disease 02/28/2015   Chronic obstructive pulmonary disease ([place])     Chronic osteomyelitis of femur ([place]) 04/06/2016   Chronic osteomyelitis of left femur ([place]) 11/22/2017   Left femur s/p prior trauma   Chronic osteomyelitis of left femur ([place]) 11/22/2017   Brodie's abscess: left femur s/p prior trauma.  Underwent partial excision and curettage of left femoral osteomyelitis at [facility] 12/30/2017 with grossly purulent material encountered within the medullary canal of the left distal femur.  Cultures grew MSSA.  Post-operatively received 6 weeks of IV antibiotics through 02/10/2018.  CRP elevated at 60.3 at end of IV antibiotic course so  continued on Keflex   Chronic pain syndrome     Left arm and leg s/p traumatic injury    Chronic renal insufficiency     Coronary artery disease     25% LAD stenosis on cath 2007.  Stable angina.   Diverticulosis     Diverticulosis 11/12/2013   Essential hypertension     Frequent PVCs    Gastroesophageal reflux disease     Gout     Hyperlipidemia LDL goal < 100     Internal hemorrhoids without complication 09/32/6712   Long-term current use of opiate analgesic 09/07/2016   Mild carpal tunnel syndrome of right wrist 01/24/2016   Mild degree electrically per EMG 01/24/2016    Mild carpal tunnel syndrome of right wrist 01/24/2016   Mild degree electrically per EMG 01/24/2016    Morbid obesity with BMI of 40.0-44.9, adult (HCC)     Normocytic anemia     NSVT (nonsustained ventricular tachycardia) (HCC)    Obstructive sleep apnea     Moderate, AHI 29.8 per hour with moderately loud snoring and oxygen desaturation to a nadir of 79%. CPAP titration resulted in a prescription for 17 CWP.     Open-angle glaucoma     Osteoarthritis cervical spine     Osteoarthritis of left knee 06/19/2013   Tricompartmental disease.  Treated with double hinged upright knee brace, steroid/xylocaine knee injections, and NSAIDs    Osteoporosis 05/14/2017   s/p fracture of the right humerus from a fall at ground  hight   Persistent atrial fibrillation (HCC)    Right rotator cuff tear     Large full-thickness tear of the supraspinatus with mild retraction but no atrophy    Right rotator cuff tear 04/25/2013   Large full-thickness tear of the supraspinatus with mild retraction but no atrophy     Secondary male hypogonadism 02/07/2017   Likely secondary to chronic opioid use   Secondary male hypogonadism 02/07/2017   Likely secondary to chronic opioid use   Sleep apnea    Subclinical hypothyroidism     Tubular adenoma of colon 11/22/2017   Specifics unknown.  Repeat colonoscopy 08/12/2018 with six 3-6 mm  tubular adenomas removed endoscopically.   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 (HCC)     Vasomotor rhinitis 04/25/2013   Past Surgical History:  Procedure Laterality Date   A-FLUTTER ABLATION N/A 03/24/2019   Procedure: A-FLUTTER ABLATION;  Surgeon: [name], MD;  Location: [place] [facility] LAB;  Service: Cardiovascular;  Laterality: N/A;   CARDIOVERSION N/A 12/30/2014   Procedure: CARDIOVERSION;  Surgeon: [name], MD;  Location: [facility] ENDOSCOPY;  Service: Cardiovascular;  Laterality: N/A;   FRACTURE SURGERY Left 1980's   Elbow   Left arm surgery     Left leg surgery     SHOULDER SURGERY     Right   Social History   Socioeconomic History   Marital status: Widowed    Spouse name: Not on file   Number of children: Not on file   Years of education: Not on file   Highest education level: Not on file  Occupational History   Occupation: Disabled  Tobacco Use   Smoking status: Never   Smokeless tobacco: Never  Vaping Use   Vaping Use: Never used  Substance and Sexual Activity   Alcohol use: Yes    Alcohol/week: 14.0 standard drinks of alcohol    Types: 14 Cans of beer per week   Drug use: No   Sexual activity: Not Currently  Other Topics Concern   Not on file  Social History Narrative   Current Social History 12/14/2020      Patient lives with a friend in a home which is 1 story. There are 4 steps with handrails up to the entrance the patient uses.       Patient's method of transportation is personal car.      The highest level of education was high school diploma.      The patient currently disabled.      Identified important Relationships are "Family"       Pets : None       Interests / Fun: "Watch TV"       Current Stressors: "None"      Religious / Personal Beliefs: Forensic scientist, RN,BSN         Social Determinants of Health   Financial Resource Strain: Not on file  Food Insecurity: Not on file   Transportation Needs: Not on file  Physical Activity: Not on file  Stress: Not on file  Social Connections: Not on file   Family History  Problem Relation Age of Onset   Heart failure Mother    Asthma Mother    Alzheimer's disease Father    Hypertension Sister    Hypertension Sister    Hypertension Sister    Cancer Brother    Cancer Brother        unknown   Early death  Brother        Gun Shot Wound   Cancer Brother        prostate   Prostate cancer Brother    Arthritis Son    Heart attack Neg Hx    Stroke Neg Hx    Colon cancer Neg Hx    Esophageal cancer Neg Hx    Pancreatic cancer Neg Hx    Stomach cancer Neg Hx    Liver disease Neg Hx    Rectal cancer Neg Hx    Allergies  Allergen Reactions   Ramipril Swelling    Facial swelling   Tape Other (See Comments)    Irritates skin/ pls use paper tape   Testosterone Rash   Prior to Admission medications   Medication Sig Start Date End Date Taking? Authorizing Provider  albuterol (VENTOLIN HFA) 108 (90 Base) MCG/ACT inhaler INHALE 1-2 PUFFS INTO THE LUNGS EVERY 6 (SIX) HOURS AS NEEDED FOR SHORTNESS OF BREATH. 09/28/21  Yes Axel Filler, MD  allopurinol (ZYLOPRIM) 300 MG tablet Take 1 tablet (300 mg total) by mouth daily. 02/14/22  Yes Axel Filler, MD  atorvastatin (LIPITOR) 20 MG tablet TAKE 1 TABLET BY MOUTH EVERY DAY 04/20/22  Yes Charise Killian, MD  Calcium Citrate-Vitamin D 315-5 MG-MCG TABS TAKE 2 TABLETS BY MOUTH DAILY 05/28/22  Yes Axel Filler, MD  cyanocobalamin ([facility] VITAMIN B12) 1000 MCG tablet Take 1 tablet (1,000 mcg total) by mouth daily. 03/15/22  Yes Axel Filler, MD  cyclobenzaprine (FLEXERIL) 5 MG tablet TAKE 1 TABLET BY MOUTH AT BEDTIME AS NEEDED FOR MUSCLE SPASMS. 05/28/22  Yes Axel Filler, MD  ELIQUIS 5 MG TABS tablet TAKE 1 TABLET BY MOUTH TWICE A DAY 06/26/22  Yes Axel Filler, MD  flecainide [facility]) 100 MG tablet TAKE 1 TABLET BY MOUTH TWICE A  DAY 05/31/22  Yes Axel Filler, MD  losartan (COZAAR) 50 MG tablet Take 1 tablet (50 mg total) by mouth daily. 10/30/21  Yes Axel Filler, MD  magnesium oxide (MAG-OX) 400 (240 Mg) MG tablet TAKE 1 TABLET BY MOUTH TWICE A DAY 11/24/21  Yes Axel Filler, MD  metFORMIN (GLUCOPHAGE) 1000 MG tablet Take 1 tablet (1,000 mg total) by mouth 2 (two) times daily with a meal. 04/11/22  Yes Axel Filler, MD  metoprolol tartrate (LOPRESSOR) 25 MG tablet TAKE 1 TABLET BY MOUTH TWICE A DAY 04/20/22  Yes Charise Killian, MD  oxyCODONE-acetaminophen (PERCOCET) 10-325 MG tablet Take 1 tablet by mouth every 6 (six) hours as needed for pain. 04/23/22  Yes Axel Filler, MD  Sorbitol 70 % SOLN Take 15-60 mLs by mouth daily as needed (for constipation). 04/23/22  Yes Axel Filler, MD  torsemide (DEMADEX) 20 MG tablet Take 1 tablet (20 mg total) by mouth daily. 02/14/22  Yes Axel Filler, MD  Blood Glucose Monitoring Suppl [facility] VERIO) w/Device KIT 1 each by Does not apply route daily. 11/04/15   Loleta Chance, MD  glucose blood [facility] VERIO) test strip Use as instructed 05/24/16   Oval Linsey, MD  latanoprost (XALATAN) 0.005 % ophthalmic solution Place 1 drop into both eyes at bedtime. 10/11/16   Oval Linsey, MD  nitroGLYCERIN (NITROSTAT) 0.4 MG SL tablet Place 1 tablet (0.4 mg total) under the tongue every 5 (five) minutes as needed for chest pain. 07/18/18   Oval Linsey, MD  [facility] DELICA LANCETS 69G MISC Use 1 strip daily 05/24/16   Oval Linsey, MD  tadalafil (CIALIS)  10 MG tablet Take 1 tablet (10 mg total) by mouth as needed for erectile dysfunction. 01/23/21 01/23/22  Axel Filler, MD  Wound Dressings [facility] WOUND DRESSING) PADS Apply 10 each topically daily. 11/02/20   [name], MD    ROS: All other systems have been reviewed and were otherwise negative with the exception of those mentioned in the HPI and as above.  Physical  Exam: General: Alert, no acute distress Cardiovascular: No pedal edema Respiratory: No cyanosis, no use of accessory musculature GI: No organomegaly, abdomen is soft and non-tender Skin: No lesions in the area of chief complaint Neurologic: Sensation intact distally Psychiatric: Patient is competent for consent with normal mood and affect Lymphatic: No axillary or cervical lymphadenopathy  MUSCULOSKELETAL:  Left shoulder: Active forward elevation to 120.  Passive to 150.  External rotation to 30.  Cuff strength is 4/5.    Imaging: MRI demonstrates gross AC arthrosis, type 2 acromion.  He has edema in the bone around the [facility] joint.  He has full thickness supraspinatus tear, fluid around the biceps.    Assessment: Left shoulder cartilage disorder, impingment syndrome,rotator cuff tear, bicep tendinitis.  Plan: Plan for Procedure(s): SHOULDER ARTHROSCOPY WITH SUBACROMIAL DECOMPRESSION, ROTATOR CUFF REPAIR AND BICEP TENDON REPAIR SHOULDER ARTHROSCOPY WITH DISTAL CLAVICLE EXCISION  The risks benefits and alternatives were discussed with the patient including but not limited to the risks of nonoperative treatment, versus surgical intervention including infection, bleeding, nerve injury,  blood clots, cardiopulmonary complications, morbidity, mortality, among others, and they were willing to proceed.   The patient acknowledged the explanation, agreed to proceed with the plan and consent was signed.   Patient received operative clearance from his cardiologist, Dr. Angelena Form, and his PCP, Dr. Evette Doffing.  Operative Plan: Left shoulder arthroscopy with SAD, DCE, BT, RCR Discharge Medications: Tylenol, Gabapentin, Robaxin, Phenergan (Tramadol for breakthrough x 3 days) DVT Prophylaxis: Resume Eliquis Physical Therapy: Outpatient PT Special Discharge needs: Sling. IceMan   Ethelda Chick, PA-C  07/04/2022 9:50 AM

## 2022-07-05 ENCOUNTER — Ambulatory Visit (HOSPITAL_BASED_OUTPATIENT_CLINIC_OR_DEPARTMENT_OTHER): Payer: Medicare Other | Admitting: Certified Registered"

## 2022-07-05 ENCOUNTER — Ambulatory Visit (HOSPITAL_BASED_OUTPATIENT_CLINIC_OR_DEPARTMENT_OTHER)
Admission: RE | Admit: 2022-07-05 | Discharge: 2022-07-05 | Disposition: A | Discharge status: Home / Self Care | Payer: Medicare Other | Attending: Orthopaedic Surgery | Admitting: Orthopaedic Surgery

## 2022-07-05 ENCOUNTER — Encounter (HOSPITAL_BASED_OUTPATIENT_CLINIC_OR_DEPARTMENT_OTHER)
Admission: RE | Disposition: A | Discharge status: Home / Self Care | Payer: Self-pay | Source: Home / Self Care | Attending: Orthopaedic Surgery

## 2022-07-05 ENCOUNTER — Encounter (HOSPITAL_BASED_OUTPATIENT_CLINIC_OR_DEPARTMENT_OTHER): Payer: Self-pay | Admitting: Orthopaedic Surgery

## 2022-07-05 ENCOUNTER — Other Ambulatory Visit: Payer: Self-pay

## 2022-07-05 DIAGNOSIS — M7542 Impingement syndrome of left shoulder: Secondary | ICD-10-CM | POA: Insufficient documentation

## 2022-07-05 DIAGNOSIS — M75102 Unspecified rotator cuff tear or rupture of left shoulder, not specified as traumatic: Secondary | ICD-10-CM

## 2022-07-05 DIAGNOSIS — I2510 Atherosclerotic heart disease of native coronary artery without angina pectoris: Secondary | ICD-10-CM | POA: Insufficient documentation

## 2022-07-05 DIAGNOSIS — E039 Hypothyroidism, unspecified: Secondary | ICD-10-CM | POA: Diagnosis not present

## 2022-07-05 DIAGNOSIS — S43432A Superior glenoid labrum lesion of left shoulder, initial encounter: Secondary | ICD-10-CM | POA: Diagnosis not present

## 2022-07-05 DIAGNOSIS — M7552 Bursitis of left shoulder: Secondary | ICD-10-CM | POA: Diagnosis not present

## 2022-07-05 DIAGNOSIS — S46012A Strain of muscle(s) and tendon(s) of the rotator cuff of left shoulder, initial encounter: Secondary | ICD-10-CM | POA: Diagnosis not present

## 2022-07-05 DIAGNOSIS — N189 Chronic kidney disease, unspecified: Secondary | ICD-10-CM | POA: Diagnosis not present

## 2022-07-05 DIAGNOSIS — G8918 Other acute postprocedural pain: Secondary | ICD-10-CM | POA: Diagnosis not present

## 2022-07-05 DIAGNOSIS — E1140 Type 2 diabetes mellitus with diabetic neuropathy, unspecified: Secondary | ICD-10-CM

## 2022-07-05 DIAGNOSIS — M19012 Primary osteoarthritis, left shoulder: Secondary | ICD-10-CM

## 2022-07-05 DIAGNOSIS — Z7984 Long term (current) use of oral hypoglycemic drugs: Secondary | ICD-10-CM | POA: Diagnosis not present

## 2022-07-05 DIAGNOSIS — X58XXXA Exposure to other specified factors, initial encounter: Secondary | ICD-10-CM | POA: Diagnosis not present

## 2022-07-05 DIAGNOSIS — I5032 Chronic diastolic (congestive) heart failure: Secondary | ICD-10-CM | POA: Insufficient documentation

## 2022-07-05 DIAGNOSIS — M7522 Bicipital tendinitis, left shoulder: Secondary | ICD-10-CM | POA: Diagnosis not present

## 2022-07-05 DIAGNOSIS — J449 Chronic obstructive pulmonary disease, unspecified: Secondary | ICD-10-CM | POA: Diagnosis not present

## 2022-07-05 DIAGNOSIS — I10 Essential (primary) hypertension: Secondary | ICD-10-CM

## 2022-07-05 DIAGNOSIS — K219 Gastro-esophageal reflux disease without esophagitis: Secondary | ICD-10-CM | POA: Insufficient documentation

## 2022-07-05 DIAGNOSIS — I130 Hypertensive heart and chronic kidney disease with heart failure and stage 1 through stage 4 chronic kidney disease, or unspecified chronic kidney disease: Secondary | ICD-10-CM | POA: Insufficient documentation

## 2022-07-05 DIAGNOSIS — E1122 Type 2 diabetes mellitus with diabetic chronic kidney disease: Secondary | ICD-10-CM | POA: Insufficient documentation

## 2022-07-05 DIAGNOSIS — G4730 Sleep apnea, unspecified: Secondary | ICD-10-CM | POA: Diagnosis not present

## 2022-07-05 DIAGNOSIS — M24112 Other articular cartilage disorders, left shoulder: Secondary | ICD-10-CM | POA: Diagnosis not present

## 2022-07-05 DIAGNOSIS — N521 Erectile dysfunction due to diseases classified elsewhere: Secondary | ICD-10-CM

## 2022-07-05 HISTORY — PX: SHOULDER ARTHROSCOPY WITH DISTAL CLAVICLE RESECTION: SHX5675

## 2022-07-05 HISTORY — PX: SHOULDER ARTHROSCOPY WITH SUBACROMIAL DECOMPRESSION, ROTATOR CUFF REPAIR AND BICEP TENDON REPAIR: SHX5687

## 2022-07-05 LAB — GLUCOSE, CAPILLARY
Glucose-Capillary: 131 mg/dL — ABNORMAL HIGH (ref 70–99)
Glucose-Capillary: 140 mg/dL — ABNORMAL HIGH (ref 70–99)

## 2022-07-05 SURGERY — SHOULDER ARTHROSCOPY WITH SUBACROMIAL DECOMPRESSION, ROTATOR CUFF REPAIR AND BICEP TENDON REPAIR
Anesthesia: General | Site: Shoulder | Laterality: Left

## 2022-07-05 MED ORDER — AMISULPRIDE (ANTIEMETIC) 5 MG/2ML IV SOLN: 10.0000 mg | Freq: Once | INTRAVENOUS | Status: DC | PRN

## 2022-07-05 MED ORDER — LACTATED RINGERS IV SOLN: INTRAVENOUS | Status: DC

## 2022-07-05 MED ORDER — PHENYLEPHRINE HCL-NACL 20-0.9 MG/250ML-% IV SOLN
INTRAVENOUS | Status: DC | PRN
  Administered 2022-07-05: 10 ug/min via INTRAVENOUS

## 2022-07-05 MED ORDER — CEFAZOLIN IN SODIUM CHLORIDE 3-0.9 GM/100ML-% IV SOLN
3.0000 g | INTRAVENOUS | Status: AC
  Administered 2022-07-05: 3 g via INTRAVENOUS

## 2022-07-05 MED ORDER — ONDANSETRON HCL 4 MG/2ML IJ SOLN
INTRAMUSCULAR | Status: DC | PRN
  Administered 2022-07-05: 4 mg via INTRAVENOUS

## 2022-07-05 MED ORDER — BUPIVACAINE HCL (PF) 0.5 % IJ SOLN
INTRAMUSCULAR | Status: DC | PRN
  Administered 2022-07-05: 12 mL via PERINEURAL

## 2022-07-05 MED ORDER — ROCURONIUM BROMIDE 100 MG/10ML IV SOLN
INTRAVENOUS | Status: DC | PRN
  Administered 2022-07-05: 60 mg via INTRAVENOUS

## 2022-07-05 MED ORDER — FENTANYL CITRATE (PF) 100 MCG/2ML IJ SOLN
INTRAMUSCULAR | Status: DC | PRN
  Administered 2022-07-05: 50 ug via INTRAVENOUS

## 2022-07-05 MED ORDER — FENTANYL CITRATE (PF) 100 MCG/2ML IJ SOLN
100.0000 ug | Freq: Once | INTRAMUSCULAR | Status: AC
  Administered 2022-07-05: 50 ug via INTRAVENOUS

## 2022-07-05 MED ORDER — ACETAMINOPHEN 10 MG/ML IV SOLN: 1000.0000 mg | Freq: Once | INTRAVENOUS | Status: DC | PRN

## 2022-07-05 MED ORDER — EPINEPHRINE PF 1 MG/ML IJ SOLN
INTRAMUSCULAR | Status: AC
  Filled 2022-07-05: qty 4

## 2022-07-05 MED ORDER — MIDAZOLAM HCL 2 MG/2ML IJ SOLN
INTRAMUSCULAR | Status: AC
  Filled 2022-07-05: qty 2

## 2022-07-05 MED ORDER — ACETAMINOPHEN 160 MG/5ML PO SOLN: 325.0000 mg | ORAL | Status: DC | PRN

## 2022-07-05 MED ORDER — DEXAMETHASONE SODIUM PHOSPHATE 10 MG/ML IJ SOLN
INTRAMUSCULAR | Status: AC
  Filled 2022-07-05: qty 1

## 2022-07-05 MED ORDER — LIDOCAINE 2% (20 MG/ML) 5 ML SYRINGE
INTRAMUSCULAR | Status: AC
  Filled 2022-07-05: qty 5

## 2022-07-05 MED ORDER — BUPIVACAINE HCL (PF) 0.25 % IJ SOLN
INTRAMUSCULAR | Status: AC
  Filled 2022-07-05: qty 30

## 2022-07-05 MED ORDER — PROPOFOL 10 MG/ML IV BOLUS
INTRAVENOUS | Status: DC | PRN
  Administered 2022-07-05: 20 mg via INTRAVENOUS
  Administered 2022-07-05: 140 mg via INTRAVENOUS

## 2022-07-05 MED ORDER — ROCURONIUM BROMIDE 10 MG/ML (PF) SYRINGE
PREFILLED_SYRINGE | INTRAVENOUS | Status: AC
  Filled 2022-07-05: qty 10

## 2022-07-05 MED ORDER — EPHEDRINE 5 MG/ML INJ
INTRAVENOUS | Status: AC
  Filled 2022-07-05: qty 5

## 2022-07-05 MED ORDER — PHENYLEPHRINE HCL (PRESSORS) 10 MG/ML IV SOLN
INTRAVENOUS | Status: AC
  Filled 2022-07-05: qty 1

## 2022-07-05 MED ORDER — CEFAZOLIN IN SODIUM CHLORIDE 3-0.9 GM/100ML-% IV SOLN
INTRAVENOUS | Status: AC
  Filled 2022-07-05: qty 100

## 2022-07-05 MED ORDER — ACETAMINOPHEN 325 MG PO TABS: 325.0000 mg | ORAL_TABLET | ORAL | Status: DC | PRN

## 2022-07-05 MED ORDER — METHOCARBAMOL 500 MG PO TABS: 500.0000 mg | ORAL_TABLET | Freq: Three times a day (TID) | ORAL | 0 refills | Status: DC | PRN

## 2022-07-05 MED ORDER — DEXAMETHASONE SODIUM PHOSPHATE 4 MG/ML IJ SOLN
INTRAMUSCULAR | Status: DC | PRN
  Administered 2022-07-05: 10 mg via INTRAVENOUS

## 2022-07-05 MED ORDER — PHENYLEPHRINE 80 MCG/ML (10ML) SYRINGE FOR IV PUSH (FOR BLOOD PRESSURE SUPPORT)
PREFILLED_SYRINGE | INTRAVENOUS | Status: AC
  Filled 2022-07-05: qty 10

## 2022-07-05 MED ORDER — OXYCODONE HCL 5 MG/5ML PO SOLN: 5.0000 mg | Freq: Once | ORAL | Status: DC | PRN

## 2022-07-05 MED ORDER — SUGAMMADEX SODIUM 500 MG/5ML IV SOLN
INTRAVENOUS | Status: AC
  Filled 2022-07-05: qty 5

## 2022-07-05 MED ORDER — ACETAMINOPHEN 500 MG PO TABS: 1000.0000 mg | ORAL_TABLET | Freq: Three times a day (TID) | ORAL | 0 refills | Status: AC

## 2022-07-05 MED ORDER — ONDANSETRON HCL 4 MG PO TABS: 4.0000 mg | ORAL_TABLET | Freq: Three times a day (TID) | ORAL | 0 refills | Status: AC | PRN

## 2022-07-05 MED ORDER — GABAPENTIN 100 MG PO CAPS: 100.0000 mg | ORAL_CAPSULE | Freq: Three times a day (TID) | ORAL | 0 refills | Status: DC

## 2022-07-05 MED ORDER — FENTANYL CITRATE (PF) 100 MCG/2ML IJ SOLN
INTRAMUSCULAR | Status: AC
  Filled 2022-07-05: qty 2

## 2022-07-05 MED ORDER — MIDAZOLAM HCL 2 MG/2ML IJ SOLN
2.0000 mg | Freq: Once | INTRAMUSCULAR | Status: AC
  Administered 2022-07-05: 1 mg via INTRAVENOUS

## 2022-07-05 MED ORDER — PROPOFOL 10 MG/ML IV BOLUS
INTRAVENOUS | Status: AC
  Filled 2022-07-05: qty 20

## 2022-07-05 MED ORDER — ONDANSETRON HCL 4 MG/2ML IJ SOLN
INTRAMUSCULAR | Status: AC
  Filled 2022-07-05: qty 2

## 2022-07-05 MED ORDER — LIDOCAINE HCL (CARDIAC) PF 100 MG/5ML IV SOSY
PREFILLED_SYRINGE | INTRAVENOUS | Status: DC | PRN
  Administered 2022-07-05: 60 mg via INTRAVENOUS

## 2022-07-05 MED ORDER — OXYCODONE HCL 5 MG PO TABS: 5.0000 mg | ORAL_TABLET | Freq: Once | ORAL | Status: DC | PRN

## 2022-07-05 MED ORDER — FENTANYL CITRATE (PF) 100 MCG/2ML IJ SOLN: 25.0000 ug | INTRAMUSCULAR | Status: DC | PRN

## 2022-07-05 MED ORDER — SODIUM CHLORIDE 0.9 % IR SOLN
Status: DC | PRN
  Administered 2022-07-05: 6000 mL

## 2022-07-05 MED ORDER — SUGAMMADEX SODIUM 500 MG/5ML IV SOLN
INTRAVENOUS | Status: DC | PRN
  Administered 2022-07-05: 500 mg via INTRAVENOUS

## 2022-07-05 MED ORDER — BUPIVACAINE LIPOSOME 1.3 % IJ SUSP
INTRAMUSCULAR | Status: DC | PRN
  Administered 2022-07-05: 10 mL via PERINEURAL

## 2022-07-05 SURGICAL SUPPLY — 61 items
AID PSTN UNV HD RSTRNT DISP (MISCELLANEOUS) ×2
ANCH SUT 2.6 FBRSTK 1.7 (Anchor) ×2 IMPLANT
ANCH SUT 2.6 FBRTK 1.7 (Anchor) ×2 IMPLANT
ANCH SUT SWLK 19.1X4.75 (Anchor) ×4 IMPLANT
ANCHOR FIBERTAK RC 2.6 (BLUE) (Anchor) IMPLANT
ANCHOR SUT BIO SW 4.75X19.1 (Anchor) IMPLANT
ANCHOR SUT FBRTK 2.6X1.7X2 (Anchor) IMPLANT
APL PRP STRL LF DISP 70% ISPRP (MISCELLANEOUS) ×2
BLADE EXCALIBUR 4.0X13 (MISCELLANEOUS) ×2 IMPLANT
BURR OVAL 8 FLU 4.0X13 (MISCELLANEOUS) ×2 IMPLANT
CANNULA 5.75X71 LONG (CANNULA) IMPLANT
CANNULA PASSPORT 5 (CANNULA) IMPLANT
CANNULA PASSPORT BUTTON 10-40 (CANNULA) IMPLANT
CANNULA TWIST IN 8.25X7CM (CANNULA) IMPLANT
CHLORAPREP W/TINT 26 (MISCELLANEOUS) ×2 IMPLANT
CLSR STERI-STRIP ANTIMIC 1/2X4 (GAUZE/BANDAGES/DRESSINGS) ×2 IMPLANT
COOLER ICEMAN CLASSIC (MISCELLANEOUS) ×2 IMPLANT
DRAPE IMP U-DRAPE 54X76 (DRAPES) ×2 IMPLANT
DRAPE INCISE IOBAN 66X45 STRL (DRAPES) IMPLANT
DRAPE SHOULDER BEACH CHAIR (DRAPES) ×2 IMPLANT
DW OUTFLOW CASSETTE/TUBE SET (MISCELLANEOUS) ×2 IMPLANT
GAUZE PAD ABD 8X10 STRL (GAUZE/BANDAGES/DRESSINGS) ×2 IMPLANT
GAUZE SPONGE 4X4 12PLY STRL (GAUZE/BANDAGES/DRESSINGS) ×2 IMPLANT
GLOVE BIO SURGEON STRL SZ 6.5 (GLOVE) ×2 IMPLANT
GLOVE BIOGEL PI IND STRL 6.5 (GLOVE) ×2 IMPLANT
GLOVE BIOGEL PI IND STRL 8 (GLOVE) ×2 IMPLANT
GLOVE ECLIPSE 8.0 STRL XLNG CF (GLOVE) ×2 IMPLANT
GOWN STRL REUS W/ TWL LRG LVL3 (GOWN DISPOSABLE) ×4 IMPLANT
GOWN STRL REUS W/TWL LRG LVL3 (GOWN DISPOSABLE) ×4
GOWN STRL REUS W/TWL XL LVL3 (GOWN DISPOSABLE) ×2 IMPLANT
KIT STABILIZATION SHOULDER (MISCELLANEOUS) ×2 IMPLANT
KIT STR SPEAR 1.8 FBRTK DISP (KITS) IMPLANT
LASSO CRESCENT QUICKPASS (SUTURE) IMPLANT
MANIFOLD NEPTUNE II (INSTRUMENTS) ×2 IMPLANT
NDL HD SCORPION MEGA LOADER (NEEDLE) IMPLANT
NDL SAFETY ECLIP 18X1.5 (MISCELLANEOUS) ×2 IMPLANT
NDL SCORPION MULTI FIRE (NEEDLE) IMPLANT
NEEDLE SCORPION MULTI FIRE (NEEDLE) IMPLANT
PACK ARTHROSCOPY DSU (CUSTOM PROCEDURE TRAY) ×2 IMPLANT
PACK BASIN DAY SURGERY FS (CUSTOM PROCEDURE TRAY) ×2 IMPLANT
PAD COLD SHLDR WRAP-ON (PAD) ×2 IMPLANT
PORT APPOLLO RF 90DEGREE MULTI (SURGICAL WAND) ×2 IMPLANT
RESTRAINT HEAD UNIVERSAL NS (MISCELLANEOUS) ×2 IMPLANT
SHEET MEDIUM DRAPE 40X70 STRL (DRAPES) ×2 IMPLANT
SLEEVE SCD COMPRESS KNEE MED (STOCKING) ×2 IMPLANT
SLING ARM FOAM STRAP LRG (SOFTGOODS) IMPLANT
SUT ETHILON 2 0 FS 18 (SUTURE) IMPLANT
SUT ETHILON 3 0 PS 1 (SUTURE) IMPLANT
SUT FIBERWIRE #2 38 T-5 BLUE (SUTURE)
SUT MNCRL AB 4-0 PS2 18 (SUTURE) ×2 IMPLANT
SUT PDS AB 1 CT  36 (SUTURE)
SUT PDS AB 1 CT 36 (SUTURE) IMPLANT
SUT TIGER TAPE 7 IN WHITE (SUTURE) IMPLANT
SUTURE FIBERWR #2 38 T-5 BLUE (SUTURE) IMPLANT
SUTURE TAPE TIGERLINK 1.3MM BL (SUTURE) IMPLANT
SUTURETAPE TIGERLINK 1.3MM BL (SUTURE) ×2
SYR 5ML LL (SYRINGE) ×2 IMPLANT
TAPE FIBER 2MM 7IN #2 BLUE (SUTURE) IMPLANT
TOWEL GREEN STERILE FF (TOWEL DISPOSABLE) ×4 IMPLANT
TUBE CONNECTING 20X1/4 (TUBING) ×2 IMPLANT
TUBING ARTHROSCOPY IRRIG 16FT (MISCELLANEOUS) ×2 IMPLANT

## 2022-07-05 NOTE — Anesthesia Preprocedure Evaluation (Signed)
Anesthesia Evaluation  Patient identified by MRN, date of birth, ID band Patient awake    Reviewed: Allergy & Precautions, NPO status , Patient's Chart, lab work & pertinent test results  Airway Mallampati: II  TM Distance: >3 FB Neck ROM: Full    Dental  (+) Missing, Poor Dentition, Dental Advisory Given   Pulmonary asthma , sleep apnea , COPD,  COPD inhaler   breath sounds clear to auscultation       Cardiovascular hypertension, Pt. on medications and Pt. on home beta blockers + CAD   Rhythm:Regular Rate:Normal  Gated Study:   The study is normal. The study is low risk.   No ST deviation was noted.   LV perfusion is normal. There is no evidence of ischemia. There is no evidence of infarction.   Left ventricular function is normal. Nuclear stress EF: 61 %. The left ventricular ejection fraction is normal (55-65%). End diastolic cavity size is mildly enlarged.   Prior study available for comparison to report. No changes compared to prior study.  Echo:  1. Left ventricular ejection fraction, by estimation, is 50 to 55%. The  left ventricle has low normal function. Left ventricular endocardial  border not optimally defined to evaluate regional wall motion. There is  mild concentric left ventricular  hypertrophy. Left ventricular diastolic parameters were normal.   2. Right ventricular systolic function is normal. The right ventricular  size is mildly enlarged. Tricuspid regurgitation signal is inadequate for  assessing PA pressure.   3. The mitral valve is grossly normal. No evidence of mitral valve  regurgitation. No evidence of mitral stenosis.   4. The aortic valve is tricuspid. Aortic valve regurgitation is not  visualized. No aortic stenosis is present.   5. Aortic dilatation noted. There is moderate dilatation of the ascending  aorta, measuring 42 mm.       Neuro/Psych  Neuromuscular disease  negative psych ROS    GI/Hepatic Neg liver ROS,GERD  ,,  Endo/Other  diabetes, Type 2, Oral Hypoglycemic AgentsHypothyroidism    Renal/GU Renal disease     Musculoskeletal  (+) Arthritis ,    Abdominal Normal abdominal exam  (+)   Peds  Hematology negative hematology ROS (+)   Anesthesia Other Findings   Reproductive/Obstetrics                              Anesthesia Physical Anesthesia Plan  ASA: 3  Anesthesia Plan: General   Post-op Pain Management: Regional block*   Induction: Intravenous  PONV Risk Score and Plan: 3 and Ondansetron, Dexamethasone and Midazolam  Airway Management Planned: Oral ETT  Additional Equipment: None  Intra-op Plan:   Post-operative Plan: Extubation in OR  Informed Consent: I have reviewed the patients History and Physical, chart, labs and discussed the procedure including the risks, benefits and alternatives for the proposed anesthesia with the patient or authorized representative who has indicated his/her understanding and acceptance.     Dental advisory given  Plan Discussed with: CRNA  Anesthesia Plan Comments:          Anesthesia Quick Evaluation

## 2022-07-05 NOTE — Anesthesia Procedure Notes (Signed)
Anesthesia Regional Block: Interscalene brachial plexus block   Pre-Anesthetic Checklist: , timeout performed,  Correct Patient, Correct Site, Correct Laterality,  Correct Procedure, Correct Position, site marked,  Risks and benefits discussed,  Surgical consent,  Pre-op evaluation,  At surgeon's request and post-op pain management  Laterality: Left  Prep: chloraprep       Needles:  Injection technique: Single-shot  Needle Type: Echogenic Stimulator Needle     Needle Length: 9cm  Needle Gauge: 21     Additional Needles:   Procedures:,,,, ultrasound used (permanent image in chart),,    Narrative:  Start time: 07/05/2022 8:40 AM End time: 07/05/2022 8:45 AM Injection made incrementally with aspirations every 5 mL.  Performed by: Personally  Anesthesiologist: Effie Berkshire, MD  Additional Notes: Discussed risks and benefits of the nerve block in detail, including but not limited vascular injury, permanent nerve damage and infection.   Patient tolerated the procedure well. Local anesthetic introduced in an incremental fashion under minimal resistance after negative aspirations. No paresthesias were elicited. After completion of the procedure, no acute issues were identified and patient continued to be monitored by RN.

## 2022-07-05 NOTE — Anesthesia Procedure Notes (Signed)
Procedure Name: Intubation Date/Time: 07/05/2022 10:15 AM  Performed by: Ezequiel Kayser, CRNAPre-anesthesia Checklist: Patient identified, Emergency Drugs available, Suction available and Patient being monitored Patient Re-evaluated:Patient Re-evaluated prior to induction Oxygen Delivery Method: Circle System Utilized Preoxygenation: Pre-oxygenation with 100% oxygen Induction Type: IV induction Ventilation: Mask ventilation without difficulty Laryngoscope Size: Mac and 4 Grade View: Grade I Tube type: Oral Tube size: 7.0 mm Number of attempts: 1 Airway Equipment and Method: Stylet and Oral airway Placement Confirmation: ETT inserted through vocal cords under direct vision, positive ETCO2 and breath sounds checked- equal and bilateral Secured at: 22 cm Tube secured with: Tape Dental Injury: Teeth and Oropharynx as per pre-operative assessment

## 2022-07-05 NOTE — Transfer of Care (Signed)
Immediate Anesthesia Transfer of Care Note  Patient: Juan Calderon  Procedure(s) Performed: SHOULDER ARTHROSCOPY WITH SUBACROMIAL DECOMPRESSION, ROTATOR CUFF REPAIR AND BICEP TENDON TENOTOMY (Left) SHOULDER ARTHROSCOPY WITH DISTAL CLAVICLE EXCISION (Left: Shoulder)  Patient Location: PACU  Anesthesia Type:General and Regional  Level of Consciousness: drowsy  Airway & Oxygen Therapy: Patient Spontanous Breathing and Patient connected to face mask oxygen  Post-op Assessment: Report given to RN and Post -op Vital signs reviewed and stable  Post vital signs: Reviewed and stable  Last Vitals:  Vitals Value Taken Time  BP 155/74 07/05/22 1128  Temp    Pulse 57 07/05/22 1130  Resp 21 07/05/22 1130  SpO2 94 % 07/05/22 1130  Vitals shown include unvalidated device data.  Last Pain:  Vitals:   07/05/22 0844  TempSrc: Oral  PainSc: 0-No pain      Patients Stated Pain Goal: 4 (41/58/30 9407)  Complications: No notable events documented.

## 2022-07-05 NOTE — Anesthesia Postprocedure Evaluation (Signed)
Anesthesia Post Note  Patient: Juan Calderon  Procedure(s) Performed: SHOULDER ARTHROSCOPY WITH SUBACROMIAL DECOMPRESSION, ROTATOR CUFF REPAIR AND BICEP TENDON TENOTOMY (Left) SHOULDER ARTHROSCOPY WITH DISTAL CLAVICLE EXCISION (Left: Shoulder)     Patient location during evaluation: PACU Anesthesia Type: General Level of consciousness: awake and alert Pain management: pain level controlled Vital Signs Assessment: post-procedure vital signs reviewed and stable Respiratory status: spontaneous breathing, nonlabored ventilation, respiratory function stable and patient connected to nasal cannula oxygen Cardiovascular status: blood pressure returned to baseline and stable Postop Assessment: no apparent nausea or vomiting Anesthetic complications: no   No notable events documented.  Last Vitals:  Vitals:   07/05/22 1203 07/05/22 1246  BP:  (!) 172/82  Pulse: (!) 58 (!) 57  Resp: (!) 21 (!) 22  Temp:  (!) 36.2 C  SpO2: 92% 93%    Last Pain:  Vitals:   07/05/22 1246  TempSrc: Oral  PainSc: 0-No pain                 Effie Berkshire

## 2022-07-05 NOTE — Interval H&P Note (Signed)
All questions answered, patient wants to proceed with procedure. ? ?

## 2022-07-05 NOTE — Discharge Instructions (Addendum)
Ophelia Charter MD, MPH Noemi Chapel, PA-C [place] [address] [phone] (tel)   [phone] (fax)   POST-OPERATIVE INSTRUCTIONS - SHOULDER ARTHROSCOPY  WOUND CARE You may remove the Operative Dressing on Post-Op Day #3 (72hrs after surgery).   Alternatively if you would like you can leave dressing on until follow-up if within 7-8 days but keep it dry. Leave steri-strips in place until they fall off on their own, usually 2 weeks postop. There may be a small amount of fluid/bleeding leaking at the surgical site.  This is normal; the shoulder is filled with fluid during the procedure and can leak for 24-48hrs after surgery.  You may change/reinforce the bandage as needed.  Use the Cryocuff or Ice as often as possible for the first 7 days, then as needed for pain relief. Always keep a towel, ACE wrap or other barrier between the cooling unit and your skin.  You may shower on Post-Op Day #3. Gently pat the area dry.  Do not soak the shoulder in water or submerge it.  Keep incisions as dry as possible. Do not go swimming in the pool or ocean until 4 weeks after surgery or when otherwise instructed.    EXERCISES Wear the sling at all times  You may remove the sling for showering, but keep the arm across the chest or in a secondary sling.     It is normal for your fingers/hand to become more swollen after surgery and discolored from bruising.   This will resolve over the first few weeks usually after surgery. Please continue to ambulate and do not stay sitting or lying for too long.  Perform foot and wrist pumps to assist in circulation.  PHYSICAL THERAPY - You will begin physical therapy soon after surgery (unless otherwise specified) - Please call to set up an appointment, if you do not already have one  - Let our office if there are any issues with scheduling your therapy   - A PT referral was sent to [facility] on [address]  hard copy of your prescription was provided to you today  REGIONAL ANESTHESIA (NERVE BLOCKS) The anesthesia team may have performed a nerve block for you this is a great tool used to minimize pain.   The block may start wearing off overnight (between 8-24 hours postop) When the block wears off, your pain may go from nearly zero to the pain you would have had postop without the block. This is an abrupt transition but nothing dangerous is happening.   This can be a challenging period but utilize your as needed pain medications to try and manage this period. We suggest you use the pain medication the first night prior to going to bed, to ease this transition.  You may take an extra dose of narcotic when this happens if needed  POST-OP MEDICATIONS- Multimodal approach to pain control In general your pain will be controlled with a combination of substances.  Prescriptions unless otherwise discussed are electronically sent to your pharmacy.  This is a carefully made plan we use to minimize narcotic use.     Acetaminophen - Non-narcotic pain medicine taken on a scheduled basis  Gabapentin - this is a medication to help with nerve based pain, take on a scheduled basis Robaxin - this is a muscle relaxer, take as needed for muscle spasms Zofran - take as needed for nausea   FOLLOW-UP If you develop a Fever (?101.5), Redness  or Drainage from the surgical incision site, please call our office to arrange for an evaluation. Please call the office to schedule a follow-up appointment for your first post-operative appointment, 7-10 days post-operatively.    HELPFUL INFORMATION   You may be more comfortable sleeping in a semi-seated position the first few nights following surgery.  Keep a pillow propped under the elbow and forearm for comfort.  If you have a recliner type of chair it might be beneficial.  If not that is fine too, but it would be helpful to sleep propped up with pillows behind your  operated shoulder as well under your elbow and forearm.  This will reduce pulling on the suture lines.  When dressing, put your operative arm in the sleeve first.  When getting undressed, take your operative arm out last.  Loose fitting, button-down shirts are recommended.  Often in the first days after surgery you may be more comfortable keeping your operative arm under your shirt and not through the sleeve.  You may return to work/school in the next couple of days when you feel up to it.  Desk work and typing in the sling is fine.  We suggest you use the pain medication the first night prior to going to bed, in order to ease any pain when the anesthesia wears off. You should avoid taking pain medications on an empty stomach as it will make you nauseous.  You should wean off your narcotic medicines as soon as you are able.  Most patients will be off or using minimal narcotics before their first postop appointment.   Do not drink alcoholic beverages or take illicit drugs when taking pain medications.  It is against the law to drive while taking narcotics.  In some states it is against the law to drive while your arm is in a sling.   Pain medication may make you constipated.  Below are a few solutions to try in this order: Decrease the amount of pain medication if you aren't having pain. Drink lots of decaffeinated fluids. Drink prune juice and/or eat dried prunes  If the first 3 don't work start with additional solutions Take Colace - an over-the-counter stool softener Take Senokot - an over-the-counter laxative Take Miralax - a stronger over-the-counter laxative  For more information including helpful videos and documents visit our website:   [email or web address]  Regional Anesthesia Blocks  1. Numbness or the inability to move the "blocked" extremity may last from 3-48 hours after placement. The length of time depends on the medication injected and your  individual response to the medication. If the numbness is not going away after 48 hours, call your surgeon.  2. The extremity that is blocked will need to be protected until the numbness is gone and the  Strength has returned. Because you cannot feel it, you will need to take extra care to avoid injury. Because it may be weak, you may have difficulty moving it or using it. You may not know what position it is in without looking at it while the block is in effect.  3. For blocks in the legs and feet, returning to weight bearing and walking needs to be done carefully. You will need to wait until the numbness is entirely gone and the strength has returned. You should be able to move your leg and foot normally before you try and bear weight or walk. You will need someone to be with you when you first try to ensure you do not  fall and possibly risk injury.  4. Bruising and tenderness at the needle site are common side effects and will resolve in a few days.  5. Persistent numbness or new problems with movement should be communicated to the surgeon or the [place] [phone] [place] [phone]). Post Anesthesia Home Care Instructions  Activity: Get plenty of rest for the remainder of the day. A responsible individual must stay with you for 24 hours following the procedure.  For the next 24 hours, DO NOT: -Drive a car -Paediatric nurse -Drink alcoholic beverages -Take any medication unless instructed by your physician -Make any legal decisions or sign important papers.  Meals: Start with liquid foods such as gelatin or soup. Progress to regular foods as tolerated. Avoid greasy, spicy, heavy foods. If nausea and/or vomiting occur, drink only clear liquids until the nausea and/or vomiting subsides. Call your physician if vomiting continues.  Special Instructions/Symptoms: Your throat may feel dry or sore from the anesthesia or the breathing tube placed in your  throat during surgery. If this causes discomfort, gargle with warm salt water. The discomfort should disappear within 24 hours.  If you had a scopolamine patch placed behind your ear for the management of post- operative nausea and/or vomiting:  1. The medication in the patch is effective for 72 hours, after which it should be removed.  Wrap patch in a tissue and discard in the trash. Wash hands thoroughly with soap and water. 2. You may remove the patch earlier than 72 hours if you experience unpleasant side effects which may include dry mouth, dizziness or visual disturbances. 3. Avoid touching the patch. Wash your hands with soap and water after contact with the patch.   Donjoy Ul[employer]ling III (Red ball):  Please contact your surgeon if you have questions or concerns about your sling.

## 2022-07-05 NOTE — Op Note (Signed)
Orthopaedic Surgery Operative Note (CSN: [id number])  Juan Calderon  1955-11-30 Date of Surgery: 07/05/2022   DIAGNOSES: Left shoulder, chronic rotator cuff tear, SLAP tear, biceps tendinitis, AC arthritis, and subacromial impingement.  POST-OPERATIVE DIAGNOSIS: same  PROCEDURE: Arthroscopic extensive debridement - 29823 Subdeltoid Bursa, Supraspinatus Tendon, Superior Labrum, Posterior Labrum, and glenoid bone, glenoid cartilage, humeral bone, humeral cartilage and biceps tenotomy Arthroscopic distal clavicle excision - 81829 Arthroscopic subacromial decompression - 93716 Arthroscopic rotator cuff repair - 96789   OPERATIVE FINDING: Exam under anesthesia: Normal Articular space:  Some redness in the anterior interval but the interval was released. Chondral surfaces: Normal Biceps:  Significant tearing of the tendon and a SLAP tear.  Tenotomy performed. Subscapularis: Normal Supraspinatus: Complete tear poor tissue quality and retracted mid humeral head tear.  2 x 2 anchor configuration completed. Infraspinatus: As above, complete tear   Tissue quality was quite poor but bone quality was good.  Patient would be a candidate for reverse total shoulder arthroplasty if he fails this.  We will start therapy 1 to 2 weeks after surgery or whenever he can get scheduled as I worry about his stiffness.  Post-operative plan: The patient will be non-weightbearing in a sling for 6 weeks with early therapy.  The patient will be discharged home.  DVT prophylaxis not indicated in ambulatory upper extremity patient without known risk factors.   Pain control with PRN pain medication preferring oral medicines.  Follow up plan will be scheduled in approximately 7 days for incision check and XR.  Surgeons:Primary: Hiram Gash, MD Assistants:Juan McBane PA-C Location: [place] OR ROOM 1 Anesthesia: General with Exparel interscalene block Antibiotics: Ancef 3 g Tourniquet time: None Estimated Blood  Loss: Minimal Complications: None Specimens: None Implants: * No implants in log *  Indications for Surgery:   Juan Calderon is a 66 y.o. male with continued shoulder pain refractory to nonoperative measures for extended period of time.    The risks and benefits were explained at length including but not limited to continued pain, cuff failure, biceps tenodesis failure, stiffness, need for further surgery and infection.   Procedure:   Patient was correctly identified in the preoperative holding area and operative site marked.  Patient brought to OR and positioned beachchair on an Juan Calderon table ensuring that all bony prominences were padded and the head was in an appropriate location.  Anesthesia was induced and the operative shoulder was prepped and draped in the usual sterile fashion.  Timeout was called preincision.  A standard posterior viewing portal was made after localizing the portal with a spinal needle.  An anterior accessory portal was also made.  After clearing the articular space the camera was positioned in the subacromial space.  Findings above.    Extensive debridement was performed of the anterior interval tissue, labral fraying and the bursa.  Glenoid bone, glenoid cartilage, humeral bone, humeral cartilage and biceps tenotomy all performed.  Subacromial decompression: We made a lateral portal with spinal needle guidance. We then proceeded to debride bursal tissue extensively with a shaver and arthrocare device. At that point we continued to identify the borders of the acromion and identify the spur. We then carefully preserved the deltoid fascia and used a burr to convert the acromion to a Type 1 flat acromion without issue.  Biceps tenodesis: We marked the tendon and then performed a tenotomy and debridement of the stump in the articular space. We then identified the biceps tendon in its groove suprapec with the arthroscope  in the lateral portal taking care to move from lateral  to medial to avoid injury to the subscapularis. At that point we unroofed the tendon itself and mobilized it. An accessory anterior portal was made in line with the tendon and we grasped it from the anterior superior portal and worked from the accessory anterior portal. Two Fibertak 1.64m knotless anchors were placed in the groove and the tendon was secured in a luggage loop style fashion with a pass of the limb of suture through the tendon using a scorpion device to avoid pull-through.  Repair was completed with good tension on the tendon.  Residual stump of the tendon was removed after being resected with a RF ablator.  Distal Clavicle resection:  The scope was placed in the subacromial space from the posterior portal.  A hemostat was placed through the anterior portal and we spread at the [facility].  A burr was then inserted and 10 mm of distal clavicle was resected taking care to avoid damage to the capsule around the joint and avoiding overhanging bone posteriorly.    Arthroscopic rotator cuff repair: Once the above is complete we used a shaver as well as a bur to prepare the tuberosity and clear soft tissue so that there was bony bleeding and appropriate bloody flecks for healing.  We then placed 2 Medial Row 2.6 mm fiber tack anchors with knotless mechanisms and tape.  We used a scorpion as well as a link suture to pass all of the sutures from each anchor through the cuff.  We then were able to use the knotless mechanism sutures to perform a double medial row tiedown compressing the medial row without overtensioning.  Once this was complete we cut the switch sutures and performed a speed bridge type repair pulling the tapes over to 2 lateral row 4.75 mm bio composite swivel locks.  This provided compression of the cuff to the tuberosity.   The incisions were closed with absorbable monocryl and steri strips.  A sterile dressing was placed along with a sling. The patient was awoken from general  anesthesia and taken to the PACU in stable condition without complication.   CNoemi Chapel PA-C, present and scrubbed throughout the case, critical for completion in a timely fashion, and for retraction, instrumentation, closure.

## 2022-07-05 NOTE — Progress Notes (Signed)
Assisted Dr. Smith Robert with left, interscalene , ultrasound guided block. Side rails up, monitors on throughout procedure. See vital signs in flow sheet. Tolerated Procedure well.

## 2022-07-06 ENCOUNTER — Other Ambulatory Visit: Payer: Self-pay | Admitting: Student in an Organized Health Care Education/Training Program

## 2022-07-06 ENCOUNTER — Encounter (HOSPITAL_BASED_OUTPATIENT_CLINIC_OR_DEPARTMENT_OTHER): Payer: Self-pay | Admitting: Orthopaedic Surgery

## 2022-07-06 ENCOUNTER — Telehealth: Payer: Self-pay | Admitting: Student in an Organized Health Care Education/Training Program

## 2022-07-06 NOTE — Telephone Encounter (Signed)
Called pt - stated he needs something for acid reflux - either another med or to be put back on the previous one. Also stated Dr Evette Doffing had taken him off his BP med with the sleep aid; now he cannot sleep; requesting something else. Thanks

## 2022-07-06 NOTE — Telephone Encounter (Signed)
Patient was calling to make Dr. Evette Doffing aware that he had surgery yesterday on his rotator cuff left arm.  Stating that two of his medications was discontinued and requesting a medication to help with acid reflux and also wants to start back on blood pressure medication with a sleep aid.  Forwarding message to triage.

## 2022-07-11 MED ORDER — FAMOTIDINE 40 MG PO TABS: 40.0000 mg | ORAL_TABLET | Freq: Every day | ORAL | 3 refills | Status: DC

## 2022-07-11 NOTE — Telephone Encounter (Signed)
Return call from pt - pt informed of Dr Vincent's response. Stated 12/4 appt is ok.

## 2022-07-11 NOTE — Telephone Encounter (Signed)
It is a tricky issue. Previous use of Protonix caused severe hypomagnesemia. I have sent in an Rx of Famotidine which we can try. May not be as effective as the PPI, but hopefully it will help a little. Sleep issue is also difficult, no easy sleep aid to prescribe now given interactions with his chronic opioids. I can discuss this more with him at our next visit on 12/4. He can try to move that visit up if we need to talk about it sooner.

## 2022-07-11 NOTE — Telephone Encounter (Signed)
Called pt - no answer. Left message on self-identified vm to call the office about his concerns.

## 2022-07-12 DIAGNOSIS — M19012 Primary osteoarthritis, left shoulder: Secondary | ICD-10-CM | POA: Diagnosis not present
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
## 2022-07-20 ENCOUNTER — Ambulatory Visit: Payer: Medicare Other | Attending: Orthopaedic Surgery

## 2022-07-20 ENCOUNTER — Other Ambulatory Visit: Payer: Self-pay

## 2022-07-20 DIAGNOSIS — M6281 Muscle weakness (generalized): Secondary | ICD-10-CM | POA: Diagnosis not present

## 2022-07-20 DIAGNOSIS — M25612 Stiffness of left shoulder, not elsewhere classified: Secondary | ICD-10-CM | POA: Diagnosis not present

## 2022-07-20 DIAGNOSIS — M25512 Pain in left shoulder: Secondary | ICD-10-CM | POA: Insufficient documentation
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
## 2022-07-31 ENCOUNTER — Encounter: Payer: Self-pay | Admitting: Physical Therapy

## 2022-07-31 ENCOUNTER — Ambulatory Visit: Payer: Medicare Other | Admitting: Physical Therapy

## 2022-07-31 DIAGNOSIS — M25512 Pain in left shoulder: Secondary | ICD-10-CM | POA: Diagnosis not present

## 2022-07-31 DIAGNOSIS — M25612 Stiffness of left shoulder, not elsewhere classified: Secondary | ICD-10-CM | POA: Diagnosis not present

## 2022-07-31 DIAGNOSIS — M6281 Muscle weakness (generalized): Secondary | ICD-10-CM

## 2022-08-02 ENCOUNTER — Encounter: Payer: Self-pay | Admitting: Physical Therapy

## 2022-08-02 ENCOUNTER — Ambulatory Visit: Payer: Medicare Other | Admitting: Physical Therapy

## 2022-08-02 DIAGNOSIS — M25512 Pain in left shoulder: Secondary | ICD-10-CM

## 2022-08-02 DIAGNOSIS — M6281 Muscle weakness (generalized): Secondary | ICD-10-CM | POA: Diagnosis not present

## 2022-08-02 DIAGNOSIS — M25612 Stiffness of left shoulder, not elsewhere classified: Secondary | ICD-10-CM
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
## 2022-08-06 ENCOUNTER — Encounter: Payer: Self-pay | Admitting: Student in an Organized Health Care Education/Training Program

## 2022-08-06 ENCOUNTER — Ambulatory Visit (INDEPENDENT_AMBULATORY_CARE_PROVIDER_SITE_OTHER): Payer: Medicare Other | Admitting: Student in an Organized Health Care Education/Training Program

## 2022-08-06 VITALS — BP 157/67 | HR 57 | Temp 97.9°F | Ht 72.0 in | Wt 296.2 lb

## 2022-08-06 DIAGNOSIS — Z23 Encounter for immunization: Secondary | ICD-10-CM

## 2022-08-06 DIAGNOSIS — Z59819 Housing instability, housed unspecified: Secondary | ICD-10-CM | POA: Diagnosis not present

## 2022-08-06 DIAGNOSIS — N521 Erectile dysfunction due to diseases classified elsewhere: Secondary | ICD-10-CM | POA: Diagnosis not present

## 2022-08-06 DIAGNOSIS — Z79891 Long term (current) use of opiate analgesic: Secondary | ICD-10-CM | POA: Diagnosis not present

## 2022-08-06 DIAGNOSIS — E1140 Type 2 diabetes mellitus with diabetic neuropathy, unspecified: Secondary | ICD-10-CM | POA: Diagnosis not present

## 2022-08-06 DIAGNOSIS — I10 Essential (primary) hypertension: Secondary | ICD-10-CM

## 2022-08-06 LAB — POCT GLYCOSYLATED HEMOGLOBIN (HGB A1C): Hemoglobin A1C: 7.3 % — AB (ref 4.0–5.6)

## 2022-08-06 LAB — GLUCOSE, CAPILLARY: Glucose-Capillary: 115 mg/dL — ABNORMAL HIGH (ref 70–99)

## 2022-08-06 MED ORDER — OXYCODONE-ACETAMINOPHEN 10-325 MG PO TABS: 1.0000 | ORAL_TABLET | Freq: Four times a day (QID) | ORAL | 0 refills | Status: DC | PRN

## 2022-08-06 MED ORDER — TERAZOSIN HCL 5 MG PO CAPS: 5.0000 mg | ORAL_CAPSULE | Freq: Every day | ORAL | 3 refills | Status: DC

## 2022-08-06 NOTE — Assessment & Plan Note (Signed)
Patient with multiple chronic pain generators including osteoarthritis of bilateral knees, and low back pain.  He has an acute pain generator now of left shoulder rotator cuff surgery about 1 month ago which is healing well.  No issues with use of chronic oxycodone.  Continues to report good benefits, helps his functional and quality of life.  Denies any adverse side effects, no falls.  I reviewed the database which was appropriate.  Will plan to continue with oxycodone 10 mg, patient uses 4 tablets daily.  I sent 60-monthsupply to his pharmacy, follow-up with me in 3 months.
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
## 2022-08-06 NOTE — Assessment & Plan Note (Signed)
Blood pressure elevated today.  Patient continue with losartan 50 mg daily given comorbid CKD stage IIIa.  In addition patient has had some issues since we discontinued Terazosin in February.  Patient was on this medication for about 10 years.  Since stopping the medicine he has had worsening of his sleep, and more elevated blood pressure readings.  Patient would like to return to terazosin.  We talked about the risks of this medication in the elderly.  Patient had no issues and previously was well controlling his blood pressure.  Will restart this medication now, I am hopeful that his issues with sleep will also improve in the coming months as his housing situation stabilizes and he finds more comfortable sleep apnea treatment.  We set a goal once those issues improve that we will again try to discontinue the terazosin in the future.

## 2022-08-06 NOTE — Assessment & Plan Note (Signed)
Chronic and stable.  Well-controlled with A1c of 7.3% today.  Plan to continue with metformin 1000 mg twice daily.  If A1c is above 7% at next visit we will talk about adding SGLT2 inhibitor.

## 2022-08-06 NOTE — Addendum Note (Signed)
Addended by: Lalla Brothers T on: 08/06/2022 11:13 AM   Modules accepted: Orders

## 2022-08-06 NOTE — Assessment & Plan Note (Signed)
Continues to have issues with housing security, still staying with a friend.  He is contemplating making a move to the [place] area to live with his son.  He is going to decide about this in the springtime.  He has income through disability.  Housing security has made it difficult for him to manage his medications and his sleep apnea treatment.

## 2022-08-06 NOTE — Progress Notes (Signed)
Annual Wellness Visit     Patient: Juan Calderon, Male    DOB: 1956/02/21, 66 y.o.   MRN: [id number]  Subjective   Juan Calderon is a 66 y.o. male who presents today for his Annual Wellness Visit. He reports consuming a general diet. Exercise is limited by orthopedic condition(s): left shoulder rotator cuff repair, going to outpatient PT. He generally feels fairly well. He reports sleeping poorly. He does not have additional problems to discuss today.   HPI  Vision:Within last year   Patient Active Problem List   Diagnosis Date Noted   Chronic use of opiate drug for therapeutic purpose 08/23/2017    Priority: High   Paroxysmal atrial fibrillation ([place]) 10/25/2015    Priority: High   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 ([place]) 04/25/2013    Priority: High   Severe obesity with body mass index (BMI) of 35.0 to 39.9 with comorbidity ([place]) 04/25/2013    Priority: High   Rotator cuff tendinitis, left 07/24/2021    Priority: Medium    Erectile dysfunction 01/23/2021    Priority: Medium    B12 deficiency 10/24/2020    Priority: Medium    Hypomagnesemia 12/28/2016    Priority: Medium    Obstructive sleep apnea 06/01/2013    Priority: Medium    Osteoarthritis cervical spine 04/25/2013    Priority: Medium    Hyperlipidemia 04/25/2013    Priority: Medium    Coronary artery disease involving native coronary artery with angina pectoris ([place]) 04/25/2013    Priority: Medium    COPD (chronic obstructive pulmonary disease) ([place]) 04/25/2013    Priority: Medium    Chronic diastolic heart failure ([place]) 02/04/2012    Priority: Medium    Essential hypertension 09/20/2011    Priority: Medium    Tubular adenoma of colon 11/22/2017    Priority: Low   C6 radiculopathy 01/24/2016    Priority: Low   Constipation due to opioid therapy 12/24/2014    Priority: Low   Post-traumatic osteoarthritis of left knee 06/19/2013    Priority: Low   Gastroesophageal  reflux disease without esophagitis 04/25/2013    Priority: Low   Open-angle glaucoma 04/25/2013    Priority: Low   Idiopathic chronic gout without tophus 04/25/2013    Priority: Startex maintenance 01/15/2013    Priority: [place] insecurity 01/22/2022   Chronic kidney disease (CKD), stage III (moderate) (HCC) 10/17/2021      Medications: Outpatient Medications Prior to Visit  Medication Sig   albuterol (VENTOLIN HFA) 108 (90 Base) MCG/ACT inhaler INHALE 1-2 PUFFS INTO THE LUNGS EVERY 6 (SIX) HOURS AS NEEDED FOR SHORTNESS OF BREATH.   allopurinol (ZYLOPRIM) 300 MG tablet Take 1 tablet (300 mg total) by mouth daily.   atorvastatin (LIPITOR) 20 MG tablet TAKE 1 TABLET BY MOUTH EVERY DAY   Blood Glucose Monitoring Suppl (ONETOUCH VERIO) w/Device KIT 1 each by Does not apply route daily.   Calcium Citrate-Vitamin D 315-5 MG-MCG TABS TAKE 2 TABLETS BY MOUTH DAILY   cyanocobalamin ([employer] VITAMIN B12) 1000 MCG tablet Take 1 tablet (1,000 mcg total) by mouth daily.   ELIQUIS 5 MG TABS tablet TAKE 1 TABLET BY MOUTH TWICE A DAY   famotidine (PEPCID) 40 MG tablet Take 1 tablet (40 mg total) by mouth daily.   flecainide (TAMBOCOR) 100 MG tablet TAKE 1 TABLET BY MOUTH TWICE A DAY   glucose blood (ONETOUCH VERIO) test strip Use as instructed   latanoprost (XALATAN)  0.005 % ophthalmic solution Place 1 drop into both eyes at bedtime.   losartan (COZAAR) 50 MG tablet Take 1 tablet (50 mg total) by mouth daily.   magnesium oxide (MAG-OX) 400 (240 Mg) MG tablet TAKE 1 TABLET BY MOUTH TWICE A DAY   metFORMIN (GLUCOPHAGE) 1000 MG tablet Take 1 tablet (1,000 mg total) by mouth 2 (two) times daily with a meal.   methocarbamol (ROBAXIN) 500 MG tablet Take 1 tablet (500 mg total) by mouth every 8 (eight) hours as needed for muscle spasms.   metoprolol tartrate (LOPRESSOR) 25 MG tablet TAKE 1 TABLET BY MOUTH TWICE A DAY   nitroGLYCERIN (NITROSTAT) 0.4 MG SL tablet Place 1 tablet (0.4 mg total)  under the tongue every 5 (five) minutes as needed for chest pain.   ONETOUCH DELICA LANCETS 73X MISC Use 1 strip daily   Sorbitol 70 % SOLN Take 15-60 mLs by mouth daily as needed (for constipation).   tadalafil (CIALIS) 10 MG tablet Take 1 tablet (10 mg total) by mouth as needed for erectile dysfunction.   torsemide (DEMADEX) 20 MG tablet Take 1 tablet (20 mg total) by mouth daily.   Wound Dressings (SORBSAN WOUND DRESSING) PADS Apply 10 each topically daily.   [DISCONTINUED] oxyCODONE-acetaminophen (PERCOCET) 10-325 MG tablet Take 1 tablet by mouth every 6 (six) hours as needed for pain.   Facility-Administered Medications Prior to Visit  Medication Dose Route Frequency Provider   0.9 %  sodium chloride infusion  500 mL Intravenous Continuous Irene Shipper, MD    Allergies  Allergen Reactions   Ramipril Swelling    Facial swelling   Tape Other (See Comments)    Irritates skin/ pls use paper tape   Testosterone Rash    Patient Care Team: Axel Filler, MD as PCP - General (Internal Medicine) Burnell Blanks, MD as PCP - Cardiology (Cardiology) Deboraha Sprang, MD as PCP - Electrophysiology (Cardiology) Clent Jacks, MD as Consulting Physician (Ophthalmology) Irene Shipper, MD as Consulting Physician (Gastroenterology) Ninetta Lights, MD (Inactive) as Consulting Physician (Orthopedic Surgery)  ROS      Objective  BP (!) 157/67 (Patient Position: Sitting, Cuff Size: Small)   Pulse (!) 57   Temp 97.9 F (36.6 C) (Oral)   Ht 6' (1.829 m)   Wt 296 lb 3.2 oz (134.4 kg)   BMI 40.17 kg/m  BP Readings from Last 3 Encounters:  08/06/22 (!) 157/67  07/05/22 (!) 172/82  04/23/22 139/64      Physical Exam  Most recent functional status assessment:    07/05/2022    8:42 AM  In your present state of health, do you have any difficulty performing the following activities:  Hearing? 0  Vision? 0  Difficulty concentrating or making decisions? 0  Walking or  climbing stairs? 0  Dressing or bathing? 0   Most recent fall risk assessment:    04/23/2022   12:02 PM  [place] in the past year? 0  Number falls in past yr: 0  Injury with Fall? 0  Follow up Falls evaluation completed    Most recent depression screenings:    04/23/2022   12:02 PM 01/22/2022   11:18 AM  PHQ 2/9 Scores  PHQ - 2 Score 0 0  PHQ- 9 Score 0    Most recent cognitive screening: Normal  Most recent Audit-C alcohol use screening    07/01/2019    3:33 PM  Alcohol Use Disorder Test (AUDIT)  1.  How often do you have a drink containing alcohol? 4  2. How many drinks containing alcohol do you have on a typical day when you are drinking? 0  3. How often do you have six or more drinks on one occasion? 1  AUDIT-C Score 5  4. How often during the last year have you found that you were not able to stop drinking once you had started? 0  5. How often during the last year have you failed to do what was normally expected from you because of drinking? 0  6. How often during the last year have you needed a first drink in the morning to get yourself going after a heavy drinking session? 0  7. How often during the last year have you had a feeling of guilt of remorse after drinking? 0  8. How often during the last year have you been unable to remember what happened the night before because you had been drinking? 0  9. Have you or someone else been injured as a result of your drinking? 0  10. Has a relative or friend or a doctor or another health worker been concerned about your drinking or suggested you cut down? 4  Alcohol Use Disorder Identification Test Final Score (AUDIT) 9   A score of 3 or more in women, and 4 or more in men indicates increased risk for alcohol abuse, EXCEPT if all of the points are from question 1   Vision/Hearing Screen:  Results for orders placed or performed in visit on 08/06/22  Glucose, capillary  Result Value Ref Range   Glucose-Capillary  115 (H) 70 - 99 mg/dL  POC Hbg A1C  Result Value Ref Range   Hemoglobin A1C 7.3 (A) 4.0 - 5.6 %   HbA1c POC (<> result, manual entry)     HbA1c, POC (prediabetic range)     HbA1c, POC (controlled diabetic range)        Assessment & Plan   Annual wellness visit done today including the all of the following: Reviewed patient's Family Medical History Reviewed and updated list of patient's medical providers Assessment of cognitive impairment was done Assessed patient's functional ability Established a written schedule for health screening services Health Risk Assessent Completed and Reviewed  Exercise Activities and Dietary recommendations  Goals      Blood Pressure < 130/80     Exercise 2-3x per week (5 min per time)     Seated and standing exercises with exercise band     HEMOGLOBIN A1C < 7.0     Weight < 294 lb (133.4 kg)     5% weight loss        Immunization History  Administered Date(s) Administered   Influenza,inj,Quad PF,6+ Mos 06/19/2013, 05/13/2014, 05/24/2016, 05/24/2017, 06/13/2018, 06/29/2019, 06/06/2020, 07/24/2021   PFIZER(Purple Top)SARS-COV-2 Vaccination 10/17/2019, 11/11/2019, 06/25/2020   Pneumococcal Conjugate-13 07/25/2020   Pneumococcal Polysaccharide-23 08/07/2013   Tdap 06/19/2013   Zoster Recombinat (Shingrix) 06/25/2020    Health Maintenance  Topic Date Due   Zoster Vaccines- Shingrix (2 of 2) 08/20/2020   Pneumonia Vaccine 12+ Years old (3 - PPSV23 or PCV20) 07/25/2021   INFLUENZA VACCINE  04/03/2022   COVID-19 Vaccine (4 - 2023-24 season) 05/04/2022   OPHTHALMOLOGY EXAM  08/16/2022   HEMOGLOBIN A1C  11/05/2022   Diabetic kidney evaluation - Urine ACR  01/23/2023   LIPID PANEL  01/23/2023   FOOT EXAM  04/24/2023   DTaP/Tdap/Td (2 - Td or Tdap) 06/20/2023   Diabetic kidney evaluation -  GFR measurement  07/04/2023   Medicare Annual Wellness (AWV)  08/07/2023   COLONOSCOPY (Pts 45-69yr Insurance coverage will need to be confirmed)   12/02/2026   Hepatitis C Screening  Completed   HPV VACCINES  Aged Out     Discussed health benefits of physical activity, and encouraged him to engage in regular exercise appropriate for his age and condition.    Problem List Items Addressed This Visit       High   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 ([facility]CC) - Primary (Chronic)    Chronic and stable.  Well-controlled with A1c of 7.3% today.  Plan to continue with metformin 1000 mg twice daily.  If A1c is above 7% at next visit we will talk about adding SGLT2 inhibitor.      Relevant Orders   POC Hbg A1C (Completed)   Chronic use of opiate drug for therapeutic purpose (Chronic)    Patient with multiple chronic pain generators including osteoarthritis of bilateral knees, and low back pain.  He has an acute pain generator now of left shoulder rotator cuff surgery about 1 month ago which is healing well.  No issues with use of chronic oxycodone.  Continues to report good benefits, helps his functional and quality of life.  Denies any adverse side effects, no falls.  I reviewed the database which was appropriate.  Will plan to continue with oxycodone 10 mg, patient uses 4 tablets daily.  I sent 324-monthupply to his pharmacy, follow-up with me in 3 months.      Relevant Medications   oxyCODONE-acetaminophen (PERCOCET) 10-325 MG tablet     Medium    Essential hypertension (Chronic)    Blood pressure elevated today.  Patient continue with losartan 50 mg daily given comorbid CKD stage IIIa.  In addition patient has had some issues since we discontinued Terazosin in February.  Patient was on this medication for about 10 years.  Since stopping the medicine he has had worsening of his sleep, and more elevated blood pressure readings.  Patient would like to return to terazosin.  We talked about the risks of this medication in the elderly.  Patient had no issues and previously was well controlling his blood pressure.   Will restart this medication now, I am hopeful that his issues with sleep will also improve in the coming months as his housing situation stabilizes and he finds more comfortable sleep apnea treatment.  We set a goal once those issues improve that we will again try to discontinue the terazosin in the future.      Relevant Medications   terazosin (HYTRIN) 5 MG capsule     Unprioritized   Housing insecurity    Continues to have issues with housing security, still staying with a friend.  [place]  He is going to decide about this in the springtime.  He has income through disability.  Housing security has made it difficult for him to manage his medications and his sleep apnea treatment.       Return in about 3 months (around 11/05/2022).     DuAxel FillerMD

## 2022-08-07 ENCOUNTER — Encounter: Payer: Self-pay | Admitting: Physical Therapy

## 2022-08-07 ENCOUNTER — Ambulatory Visit: Payer: Medicare Other | Attending: Orthopaedic Surgery | Admitting: Physical Therapy

## 2022-08-07 DIAGNOSIS — M6281 Muscle weakness (generalized): Secondary | ICD-10-CM | POA: Insufficient documentation

## 2022-08-07 DIAGNOSIS — M25612 Stiffness of left shoulder, not elsewhere classified: Secondary | ICD-10-CM | POA: Insufficient documentation

## 2022-08-07 DIAGNOSIS — M25512 Pain in left shoulder: Secondary | ICD-10-CM | POA: Diagnosis not present

## 2022-08-09 ENCOUNTER — Ambulatory Visit: Payer: Medicare Other | Admitting: Physical Therapy

## 2022-08-09 ENCOUNTER — Encounter: Payer: Self-pay | Admitting: Physical Therapy

## 2022-08-09 DIAGNOSIS — M6281 Muscle weakness (generalized): Secondary | ICD-10-CM | POA: Diagnosis not present

## 2022-08-09 DIAGNOSIS — M25612 Stiffness of left shoulder, not elsewhere classified: Secondary | ICD-10-CM | POA: Diagnosis not present

## 2022-08-09 DIAGNOSIS — M25512 Pain in left shoulder: Secondary | ICD-10-CM | POA: Diagnosis not present
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
## 2022-08-14 ENCOUNTER — Ambulatory Visit: Payer: Medicare Other | Admitting: Physical Therapy

## 2022-08-14 ENCOUNTER — Encounter: Payer: Self-pay | Admitting: Physical Therapy

## 2022-08-14 DIAGNOSIS — M25512 Pain in left shoulder: Secondary | ICD-10-CM | POA: Diagnosis not present

## 2022-08-14 DIAGNOSIS — M25612 Stiffness of left shoulder, not elsewhere classified: Secondary | ICD-10-CM

## 2022-08-14 DIAGNOSIS — M6281 Muscle weakness (generalized): Secondary | ICD-10-CM

## 2022-08-16 ENCOUNTER — Ambulatory Visit: Payer: Medicare Other

## 2022-08-16 DIAGNOSIS — M6281 Muscle weakness (generalized): Secondary | ICD-10-CM

## 2022-08-16 DIAGNOSIS — M19012 Primary osteoarthritis, left shoulder: Secondary | ICD-10-CM | POA: Diagnosis not present

## 2022-08-16 DIAGNOSIS — M25612 Stiffness of left shoulder, not elsewhere classified: Secondary | ICD-10-CM

## 2022-08-16 DIAGNOSIS — M25512 Pain in left shoulder: Secondary | ICD-10-CM
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
## 2022-08-20 ENCOUNTER — Telehealth: Payer: Self-pay | Admitting: Student in an Organized Health Care Education/Training Program

## 2022-08-20 NOTE — Telephone Encounter (Signed)
RTC to patient states has a cough with congestion..  Gets every year after getting Flu shot.  Coughing up whitish looking phlegm.  No fevers.  Has taken Nyquil, Cloriciden.   States unable to get on his breathing machine because he will have t change the tubing.  Gets supplies for his CPAP every 3 months.  States in the past has gotten a cough medication called to the Pharmacy.  Would like something called to the [facility] on [address] and [address].

## 2022-08-20 NOTE — Telephone Encounter (Signed)
Pt reporting since 08/06/2022 when he got his flu shot and has had a cold.  The Cold has now gotten worse. Pt reporting he is having terrible Cough, Congestion and a Stuffy Nose.  Pt requesting a medication to be called in if possible.
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
## 2022-08-21 ENCOUNTER — Encounter: Payer: Self-pay | Admitting: Physical Therapy

## 2022-08-21 ENCOUNTER — Ambulatory Visit: Payer: Medicare Other | Admitting: Physical Therapy

## 2022-08-21 DIAGNOSIS — M25512 Pain in left shoulder: Secondary | ICD-10-CM

## 2022-08-21 DIAGNOSIS — M6281 Muscle weakness (generalized): Secondary | ICD-10-CM

## 2022-08-21 DIAGNOSIS — M25612 Stiffness of left shoulder, not elsewhere classified: Secondary | ICD-10-CM

## 2022-08-21 DIAGNOSIS — G4733 Obstructive sleep apnea (adult) (pediatric): Secondary | ICD-10-CM | POA: Diagnosis not present

## 2022-08-21 MED ORDER — GUAIFENESIN-DM 100-10 MG/5ML PO SYRP: 10.0000 mL | ORAL_SOLUTION | ORAL | 0 refills | Status: DC | PRN

## 2022-08-21 NOTE — Telephone Encounter (Signed)
RTC to patient states has tried Robitussin DM in the past week with no results.  States will try again ad give [place] a call back if this does not work for him.

## 2022-08-21 NOTE — Telephone Encounter (Signed)
Ok. I have prescribed Robitussin DM which is the syrup he has used in the past. He should not use Coricidin if he starts this new medication.

## 2022-08-23 ENCOUNTER — Ambulatory Visit: Payer: Medicare Other | Admitting: Physical Therapy

## 2022-08-25 ENCOUNTER — Other Ambulatory Visit: Payer: Self-pay | Admitting: Student in an Organized Health Care Education/Training Program

## 2022-08-25 DIAGNOSIS — I480 Paroxysmal atrial fibrillation: Secondary | ICD-10-CM

## 2022-08-28 ENCOUNTER — Ambulatory Visit: Payer: Medicare Other | Admitting: Physical Therapy

## 2022-08-28 ENCOUNTER — Encounter: Payer: Self-pay | Admitting: Physical Therapy

## 2022-08-28 DIAGNOSIS — M6281 Muscle weakness (generalized): Secondary | ICD-10-CM

## 2022-08-28 DIAGNOSIS — M25512 Pain in left shoulder: Secondary | ICD-10-CM

## 2022-08-28 DIAGNOSIS — M25612 Stiffness of left shoulder, not elsewhere classified: Secondary | ICD-10-CM | POA: Diagnosis not present
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
## 2022-08-30 ENCOUNTER — Telehealth: Payer: Self-pay | Admitting: *Deleted

## 2022-08-30 ENCOUNTER — Ambulatory Visit: Payer: Medicare Other | Admitting: Physical Therapy

## 2022-08-30 NOTE — Telephone Encounter (Signed)
RTC to patient took the Covid Test was negative.  Is already taking the Robitussin.

## 2022-08-30 NOTE — Telephone Encounter (Signed)
Person with multiple comorbidities and some high risk features to his cough and dyspnea. I agree that it would be ideal for him to be evaluated in person. No easy solution to his cough, he already takes opioids so I dont think a codeine product would help. He can use over the counter robitussin. Would recommend again going to urgent care if dyspnea worsens. If he does have covid, we can consider Paxlovid but would have to adjust his anticoagulant first.

## 2022-08-30 NOTE — Telephone Encounter (Signed)
Call from patient states is continuing to have a cough.  Coughing up white mucous.  Complaints of Chest Pain as well comes and goes over past 3 days.  No fevers .  Dizzy is able to eat, smell and taste.  Has had some wheezing as well.  Always has Shortness of breath.  Has not taken a Covid Test as of yet.  Has some at home.  Patient also said that others in home have something as well.   Advised to take test.  Refuses to go to the ER or Urgent Care due to waits when advised to go.Will send message to PCP to see if something else can be ordered for patient.

## 2022-09-04 ENCOUNTER — Ambulatory Visit: Payer: 59 | Attending: Orthopaedic Surgery | Admitting: Physical Therapy

## 2022-09-04 DIAGNOSIS — M25612 Stiffness of left shoulder, not elsewhere classified: Secondary | ICD-10-CM | POA: Insufficient documentation

## 2022-09-04 DIAGNOSIS — M6281 Muscle weakness (generalized): Secondary | ICD-10-CM | POA: Diagnosis not present

## 2022-09-04 DIAGNOSIS — M25512 Pain in left shoulder: Secondary | ICD-10-CM | POA: Diagnosis not present
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
## 2022-09-06 ENCOUNTER — Ambulatory Visit: Payer: 59

## 2022-09-06 DIAGNOSIS — M25512 Pain in left shoulder: Secondary | ICD-10-CM | POA: Diagnosis not present

## 2022-09-06 DIAGNOSIS — M25612 Stiffness of left shoulder, not elsewhere classified: Secondary | ICD-10-CM | POA: Diagnosis not present

## 2022-09-06 DIAGNOSIS — M6281 Muscle weakness (generalized): Secondary | ICD-10-CM | POA: Diagnosis not present
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
## 2022-09-11 ENCOUNTER — Ambulatory Visit: Payer: 59 | Admitting: Physical Therapy
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
## 2022-09-13 ENCOUNTER — Ambulatory Visit: Payer: 59

## 2022-09-13 DIAGNOSIS — M25512 Pain in left shoulder: Secondary | ICD-10-CM | POA: Diagnosis not present

## 2022-09-13 DIAGNOSIS — M25612 Stiffness of left shoulder, not elsewhere classified: Secondary | ICD-10-CM

## 2022-09-13 DIAGNOSIS — M6281 Muscle weakness (generalized): Secondary | ICD-10-CM
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
## 2022-09-18 ENCOUNTER — Ambulatory Visit: Payer: 59

## 2022-09-18 DIAGNOSIS — M25612 Stiffness of left shoulder, not elsewhere classified: Secondary | ICD-10-CM

## 2022-09-18 DIAGNOSIS — M6281 Muscle weakness (generalized): Secondary | ICD-10-CM

## 2022-09-18 DIAGNOSIS — M25512 Pain in left shoulder: Secondary | ICD-10-CM | POA: Diagnosis not present
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
## 2022-09-20 ENCOUNTER — Ambulatory Visit: Payer: 59

## 2022-09-20 DIAGNOSIS — M6281 Muscle weakness (generalized): Secondary | ICD-10-CM

## 2022-09-20 DIAGNOSIS — M25512 Pain in left shoulder: Secondary | ICD-10-CM | POA: Diagnosis not present

## 2022-09-20 DIAGNOSIS — M25612 Stiffness of left shoulder, not elsewhere classified: Secondary | ICD-10-CM | POA: Diagnosis not present

## 2022-09-25 ENCOUNTER — Ambulatory Visit: Payer: 59 | Admitting: Physical Therapy

## 2022-09-27 ENCOUNTER — Ambulatory Visit: Payer: 59 | Admitting: Physical Therapy

## 2022-09-27 DIAGNOSIS — M19012 Primary osteoarthritis, left shoulder: Secondary | ICD-10-CM | POA: Diagnosis not present

## 2022-10-02 ENCOUNTER — Ambulatory Visit: Payer: 59 | Admitting: Physical Therapy

## 2022-10-02 ENCOUNTER — Encounter: Payer: Self-pay | Admitting: Physical Therapy

## 2022-10-02 DIAGNOSIS — M25612 Stiffness of left shoulder, not elsewhere classified: Secondary | ICD-10-CM | POA: Diagnosis not present

## 2022-10-02 DIAGNOSIS — M6281 Muscle weakness (generalized): Secondary | ICD-10-CM | POA: Diagnosis not present

## 2022-10-02 DIAGNOSIS — M25512 Pain in left shoulder: Secondary | ICD-10-CM | POA: Diagnosis not present
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
## 2022-10-04 ENCOUNTER — Ambulatory Visit: Payer: 59 | Attending: Orthopaedic Surgery | Admitting: Physical Therapy

## 2022-10-04 ENCOUNTER — Encounter: Payer: Self-pay | Admitting: Physical Therapy

## 2022-10-04 DIAGNOSIS — M25512 Pain in left shoulder: Secondary | ICD-10-CM | POA: Diagnosis not present

## 2022-10-04 DIAGNOSIS — M25612 Stiffness of left shoulder, not elsewhere classified: Secondary | ICD-10-CM | POA: Insufficient documentation

## 2022-10-04 DIAGNOSIS — M6281 Muscle weakness (generalized): Secondary | ICD-10-CM | POA: Diagnosis not present
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
## 2022-10-09 ENCOUNTER — Ambulatory Visit: Payer: 59 | Admitting: Physical Therapy

## 2022-10-09 ENCOUNTER — Encounter: Payer: Self-pay | Admitting: Physical Therapy

## 2022-10-09 DIAGNOSIS — M25612 Stiffness of left shoulder, not elsewhere classified: Secondary | ICD-10-CM | POA: Diagnosis not present

## 2022-10-09 DIAGNOSIS — M25512 Pain in left shoulder: Secondary | ICD-10-CM | POA: Diagnosis not present

## 2022-10-09 DIAGNOSIS — M6281 Muscle weakness (generalized): Secondary | ICD-10-CM
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
## 2022-10-11 ENCOUNTER — Encounter: Payer: Self-pay | Admitting: Physical Therapy

## 2022-10-11 ENCOUNTER — Ambulatory Visit: Payer: 59 | Admitting: Physical Therapy

## 2022-10-11 DIAGNOSIS — M6281 Muscle weakness (generalized): Secondary | ICD-10-CM | POA: Diagnosis not present

## 2022-10-11 DIAGNOSIS — M25512 Pain in left shoulder: Secondary | ICD-10-CM | POA: Diagnosis not present

## 2022-10-11 DIAGNOSIS — M25612 Stiffness of left shoulder, not elsewhere classified: Secondary | ICD-10-CM

## 2022-10-16 ENCOUNTER — Ambulatory Visit: Payer: 59 | Admitting: Physical Therapy

## 2022-10-16 DIAGNOSIS — S46012A Strain of muscle(s) and tendon(s) of the rotator cuff of left shoulder, initial encounter: Secondary | ICD-10-CM | POA: Diagnosis not present
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
## 2022-10-18 ENCOUNTER — Encounter: Payer: Self-pay | Admitting: Physical Therapy

## 2022-10-18 ENCOUNTER — Ambulatory Visit: Payer: 59 | Admitting: Physical Therapy

## 2022-10-18 DIAGNOSIS — M25612 Stiffness of left shoulder, not elsewhere classified: Secondary | ICD-10-CM

## 2022-10-18 DIAGNOSIS — M25512 Pain in left shoulder: Secondary | ICD-10-CM

## 2022-10-18 DIAGNOSIS — M6281 Muscle weakness (generalized): Secondary | ICD-10-CM | POA: Diagnosis not present

## 2022-10-21 ENCOUNTER — Other Ambulatory Visit: Payer: Self-pay | Admitting: Student in an Organized Health Care Education/Training Program

## 2022-10-21 DIAGNOSIS — I10 Essential (primary) hypertension: Secondary | ICD-10-CM
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
## 2022-10-27 ENCOUNTER — Ambulatory Visit: Payer: 59 | Admitting: Physical Therapy

## 2022-10-27 ENCOUNTER — Other Ambulatory Visit: Payer: Self-pay

## 2022-10-27 ENCOUNTER — Encounter: Payer: Self-pay | Admitting: Physical Therapy

## 2022-10-27 DIAGNOSIS — M6281 Muscle weakness (generalized): Secondary | ICD-10-CM | POA: Diagnosis not present

## 2022-10-27 DIAGNOSIS — M25612 Stiffness of left shoulder, not elsewhere classified: Secondary | ICD-10-CM | POA: Diagnosis not present

## 2022-10-27 DIAGNOSIS — M25512 Pain in left shoulder: Secondary | ICD-10-CM | POA: Diagnosis not present

## 2022-10-29 ENCOUNTER — Other Ambulatory Visit: Payer: Self-pay

## 2022-10-29 DIAGNOSIS — Z79891 Long term (current) use of opiate analgesic: Secondary | ICD-10-CM

## 2022-10-29 MED ORDER — OXYCODONE-ACETAMINOPHEN 10-325 MG PO TABS: 1.0000 | ORAL_TABLET | Freq: Four times a day (QID) | ORAL | 0 refills | Status: DC | PRN

## 2022-10-30 DIAGNOSIS — H04123 Dry eye syndrome of bilateral lacrimal glands: Secondary | ICD-10-CM | POA: Diagnosis not present

## 2022-10-30 DIAGNOSIS — Z961 Presence of intraocular lens: Secondary | ICD-10-CM | POA: Diagnosis not present

## 2022-10-30 DIAGNOSIS — H1045 Other chronic allergic conjunctivitis: Secondary | ICD-10-CM | POA: Diagnosis not present

## 2022-10-30 DIAGNOSIS — E119 Type 2 diabetes mellitus without complications: Secondary | ICD-10-CM | POA: Diagnosis not present

## 2022-10-30 DIAGNOSIS — G4730 Sleep apnea, unspecified: Secondary | ICD-10-CM | POA: Diagnosis not present

## 2022-10-30 DIAGNOSIS — H40013 Open angle with borderline findings, low risk, bilateral: Secondary | ICD-10-CM | POA: Diagnosis not present

## 2022-10-30 DIAGNOSIS — H26493 Other secondary cataract, bilateral: Secondary | ICD-10-CM | POA: Diagnosis not present

## 2022-10-30 LAB — HM DIABETES EYE EXAM

## 2022-11-05 ENCOUNTER — Ambulatory Visit (INDEPENDENT_AMBULATORY_CARE_PROVIDER_SITE_OTHER): Payer: 59 | Admitting: Student in an Organized Health Care Education/Training Program

## 2022-11-05 ENCOUNTER — Encounter: Payer: Self-pay | Admitting: Student in an Organized Health Care Education/Training Program

## 2022-11-05 VITALS — BP 131/71 | HR 53 | Temp 97.7°F | Ht 72.0 in | Wt 296.5 lb

## 2022-11-05 DIAGNOSIS — Z7984 Long term (current) use of oral hypoglycemic drugs: Secondary | ICD-10-CM

## 2022-11-05 DIAGNOSIS — E7800 Pure hypercholesterolemia, unspecified: Secondary | ICD-10-CM | POA: Diagnosis not present

## 2022-11-05 DIAGNOSIS — E1122 Type 2 diabetes mellitus with diabetic chronic kidney disease: Secondary | ICD-10-CM | POA: Diagnosis not present

## 2022-11-05 DIAGNOSIS — I129 Hypertensive chronic kidney disease with stage 1 through stage 4 chronic kidney disease, or unspecified chronic kidney disease: Secondary | ICD-10-CM

## 2022-11-05 DIAGNOSIS — E1140 Type 2 diabetes mellitus with diabetic neuropathy, unspecified: Secondary | ICD-10-CM | POA: Diagnosis not present

## 2022-11-05 DIAGNOSIS — N1831 Chronic kidney disease, stage 3a: Secondary | ICD-10-CM | POA: Diagnosis not present

## 2022-11-05 DIAGNOSIS — I10 Essential (primary) hypertension: Secondary | ICD-10-CM

## 2022-11-05 DIAGNOSIS — N521 Erectile dysfunction due to diseases classified elsewhere: Secondary | ICD-10-CM

## 2022-11-05 DIAGNOSIS — M7582 Other shoulder lesions, left shoulder: Secondary | ICD-10-CM

## 2022-11-05 DIAGNOSIS — Z79891 Long term (current) use of opiate analgesic: Secondary | ICD-10-CM

## 2022-11-05 LAB — POCT GLYCOSYLATED HEMOGLOBIN (HGB A1C): Hemoglobin A1C: 6.9 % — AB (ref 4.0–5.6)

## 2022-11-05 LAB — GLUCOSE, CAPILLARY: Glucose-Capillary: 141 mg/dL — ABNORMAL HIGH (ref 70–99)

## 2022-11-05 MED ORDER — OXYCODONE-ACETAMINOPHEN 10-325 MG PO TABS: 1.0000 | ORAL_TABLET | Freq: Four times a day (QID) | ORAL | 0 refills | Status: DC | PRN

## 2022-11-05 NOTE — Assessment & Plan Note (Signed)
Left shoulder recovery going well, patient is adherent to PT exercises.  Pretty good range of motion of the left shoulder today with only very mild impingement.  He will continue with physical therapy exercises.

## 2022-11-05 NOTE — Assessment & Plan Note (Signed)
Chronic use of opioid pain medication due to chronic arthritis in his lumbar spine and bilateral hips and knees.  He had left shoulder arthroscopic surgery a few months ago and has been tolerating physical therapy well.  Has a new acute pain generator on his right side which I think is muscle strain at the ribs.  No falls or complications of current dose of opioid.  Plan to check urine tox assure today.  Continue with oxycodone 10 mg, patient uses about 4 tablets daily, 100 tablets last 30 days.  I refilled for 52-monthsupply.

## 2022-11-05 NOTE — Progress Notes (Signed)
Established Patient Office Visit  Subjective   Patient ID: Juan Calderon, male    DOB: Jan 04, 1956  Age: 67 y.o. MRN: [id number]  Chief Complaint  Patient presents with   Back Pain   Diabetes    HPI  67 year old person here for follow-up of hypertension and diabetes.  Patient reports doing well with his current medications, good adherence, no issues with side effects.  No recent falls.  He has been engaging consistently with physical therapy after his left shoulder arthroscopic rotator cuff repair in November.  He reports good movement of the left shoulder, says that it is coming along.  Having some more soreness in the right shoulder.  He has an acute concern today of pain on his right mid back radiating to the right chest just below his breast tissue.  This is nonexertional, it is worsened with changes in the movements and certain positions.  Has been there about 1 month.  No trauma or inciting factor.    Objective:     BP 131/71 (BP Location: Left Arm, Patient Position: Sitting, Cuff Size: Normal)   Pulse (!) 53   Temp 97.7 F (36.5 C) (Oral)   Ht 6' (1.829 m)   Wt 296 lb 8 oz (134.5 kg)   SpO2 97%   BMI 40.21 kg/m    Physical Exam  Gen: Well-appearing man, no distress CV: Bradycardic, no murmurs Lungs: Unlabored, clear throughout, no wheezing or crackles, breath sounds heard all the way down to the right base Chest: Normal skin over the area that is tender on his right flank and chest.  No hematoma or bruising.  No point tenderness over any of the ribs.  No tenderness paraspinally.  Some tenderness at the scapula. Ext: Warm and well-perfused, no edema today    Assessment & Plan:   Problem List Items Addressed This Visit       High   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 (HCC) - Primary (Chronic)    Hemoglobin A1c under good control at 6.9%.  Plan to continue metformin 1000 mg twice daily.  Diabetes is complicated by CKD, continue  losartan for renal protection.  We considered starting SGLT2 inhibitor, but patient already struggles with polypharmacy.  Will check urine microalbumin today and renal function.      Relevant Orders   POC Hbg A1C (Completed)   Microalbumin / Creatinine Urine Ratio   Chronic use of opiate drug for therapeutic purpose (Chronic)    Chronic use of opioid pain medication due to chronic arthritis in his lumbar spine and bilateral hips and knees.  He had left shoulder arthroscopic surgery a few months ago and has been tolerating physical therapy well.  Has a new acute pain generator on his right side which I think is muscle strain at the ribs.  No falls or complications of current dose of opioid.  Plan to check urine tox assure today.  Continue with oxycodone 10 mg, patient uses about 4 tablets daily, 100 tablets last 30 days.  I refilled for 59-monthsupply.       Relevant Medications   oxyCODONE-acetaminophen (PERCOCET) 10-325 MG tablet   Other Relevant Orders   ToxAssure Select,+Antidepr,UR     Medium    Essential hypertension (Chronic)    Blood pressure well-controlled today.  Plan to continue losartan 50 mg daily.      Rotator cuff tendinitis, left (Chronic)    Left shoulder recovery going well, patient is adherent to PT exercises.  Pretty good range of motion of the left shoulder today with only very mild impingement.  He will continue with physical therapy exercises.      Chronic kidney disease (CKD), stage III (moderate) (HCC) (Chronic)    Chronic and stable, stage IIIb.  Will plan to check BMP today and continue with losartan.  Will also check urine microalbumin and can add SGLT2 inhibitor if he has significant proteinuria.      Relevant Orders   BMP8+Anion Gap   Hypomagnesemia (Chronic)   Relevant Orders   Magnesium   Hyperlipidemia (Chronic)   Relevant Orders   Lipid Profile    Return in about 3 months (around 02/05/2023).    Axel Filler, MD

## 2022-11-05 NOTE — Assessment & Plan Note (Addendum)
Chronic and stable, stage IIIb.  Will plan to check BMP today and continue with losartan.  Will also check urine microalbumin and can add SGLT2 inhibitor if he has significant proteinuria.

## 2022-11-05 NOTE — Assessment & Plan Note (Signed)
Blood pressure well-controlled today.  Plan to continue losartan 50 mg daily.

## 2022-11-05 NOTE — Assessment & Plan Note (Signed)
Hemoglobin A1c under good control at 6.9%.  Plan to continue metformin 1000 mg twice daily.  Diabetes is complicated by CKD, continue losartan for renal protection.  We considered starting SGLT2 inhibitor, but patient already struggles with polypharmacy.  Will check urine microalbumin today and renal function.

## 2022-11-06 ENCOUNTER — Ambulatory Visit: Payer: 59 | Attending: Orthopaedic Surgery | Admitting: Physical Therapy

## 2022-11-06 ENCOUNTER — Encounter: Payer: Self-pay | Admitting: Physical Therapy

## 2022-11-06 DIAGNOSIS — R252 Cramp and spasm: Secondary | ICD-10-CM | POA: Diagnosis not present

## 2022-11-06 DIAGNOSIS — M6281 Muscle weakness (generalized): Secondary | ICD-10-CM | POA: Insufficient documentation

## 2022-11-06 DIAGNOSIS — M25512 Pain in left shoulder: Secondary | ICD-10-CM | POA: Diagnosis not present

## 2022-11-06 DIAGNOSIS — M546 Pain in thoracic spine: Secondary | ICD-10-CM | POA: Diagnosis not present

## 2022-11-06 DIAGNOSIS — M25612 Stiffness of left shoulder, not elsewhere classified: Secondary | ICD-10-CM | POA: Insufficient documentation
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
## 2022-11-07 LAB — LIPID PANEL
Chol/HDL Ratio: 1.9 ratio (ref 0.0–5.0)
Cholesterol, Total: 112 mg/dL (ref 100–199)
HDL: 59 mg/dL (ref >39)
LDL Chol Calc (NIH): 36 mg/dL (ref 0–99)
Triglycerides: 85 mg/dL (ref 0–149)
VLDL Cholesterol Cal: 17 mg/dL (ref 5–40)

## 2022-11-07 LAB — BMP8+ANION GAP
Anion Gap: 18 mmol/L (ref 10.0–18.0)
BUN/Creatinine Ratio: 18 (ref 10–24)
BUN: 26 mg/dL (ref 8–27)
CO2: 21 mmol/L (ref 20–29)
Calcium: 9.9 mg/dL (ref 8.6–10.2)
Chloride: 104 mmol/L (ref 96–106)
Creatinine, Ser: 1.45 mg/dL — ABNORMAL HIGH (ref 0.76–1.27)
Glucose: 143 mg/dL — ABNORMAL HIGH (ref 70–99)
Potassium: 4.4 mmol/L (ref 3.5–5.2)
Sodium: 143 mmol/L (ref 134–144)
eGFR: 52 mL/min/{1.73_m2} — ABNORMAL LOW (ref >59)

## 2022-11-07 LAB — MAGNESIUM: Magnesium: 1.4 mg/dL — ABNORMAL LOW (ref 1.6–2.3)

## 2022-11-08 ENCOUNTER — Encounter: Payer: Self-pay | Admitting: Student in an Organized Health Care Education/Training Program

## 2022-11-08 ENCOUNTER — Encounter: Payer: 59 | Admitting: Physical Therapy

## 2022-11-08 LAB — MICROALBUMIN / CREATININE URINE RATIO
Creatinine, Urine: 141.1 mg/dL
Microalb/Creat Ratio: <2 mg/g creat (ref 0–29)
Microalbumin, Urine: <3 ug/mL

## 2022-11-08 LAB — TOXASSURE SELECT,+ANTIDEPR,UR
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
## 2022-11-09 ENCOUNTER — Encounter: Payer: Self-pay | Admitting: Physical Therapy

## 2022-11-09 ENCOUNTER — Ambulatory Visit: Payer: 59 | Admitting: Physical Therapy

## 2022-11-09 DIAGNOSIS — M6281 Muscle weakness (generalized): Secondary | ICD-10-CM

## 2022-11-09 DIAGNOSIS — M25612 Stiffness of left shoulder, not elsewhere classified: Secondary | ICD-10-CM

## 2022-11-09 DIAGNOSIS — M25512 Pain in left shoulder: Secondary | ICD-10-CM | POA: Diagnosis not present

## 2022-11-09 DIAGNOSIS — R252 Cramp and spasm: Secondary | ICD-10-CM | POA: Diagnosis not present

## 2022-11-09 DIAGNOSIS — M546 Pain in thoracic spine: Secondary | ICD-10-CM | POA: Diagnosis not present

## 2022-11-11 ENCOUNTER — Other Ambulatory Visit: Payer: Self-pay | Admitting: Student in an Organized Health Care Education/Training Program

## 2022-11-13 DIAGNOSIS — S46012A Strain of muscle(s) and tendon(s) of the rotator cuff of left shoulder, initial encounter: Secondary | ICD-10-CM | POA: Diagnosis not present

## 2022-11-14 DIAGNOSIS — G4733 Obstructive sleep apnea (adult) (pediatric): Secondary | ICD-10-CM | POA: Diagnosis not present

## 2022-11-19 ENCOUNTER — Encounter: Payer: Self-pay | Admitting: Physical Therapy

## 2022-11-19 ENCOUNTER — Encounter: Payer: Self-pay | Admitting: Dietician

## 2022-11-19 ENCOUNTER — Ambulatory Visit: Payer: 59 | Admitting: Physical Therapy

## 2022-11-19 DIAGNOSIS — M25512 Pain in left shoulder: Secondary | ICD-10-CM

## 2022-11-19 DIAGNOSIS — M546 Pain in thoracic spine: Secondary | ICD-10-CM | POA: Diagnosis not present

## 2022-11-19 DIAGNOSIS — R252 Cramp and spasm: Secondary | ICD-10-CM | POA: Diagnosis not present

## 2022-11-19 DIAGNOSIS — M6281 Muscle weakness (generalized): Secondary | ICD-10-CM

## 2022-11-19 DIAGNOSIS — M25612 Stiffness of left shoulder, not elsewhere classified: Secondary | ICD-10-CM | POA: Diagnosis not present
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
## 2022-11-21 ENCOUNTER — Ambulatory Visit: Payer: 59 | Admitting: Physical Therapy

## 2022-11-21 DIAGNOSIS — M25512 Pain in left shoulder: Secondary | ICD-10-CM | POA: Diagnosis not present

## 2022-11-23 DIAGNOSIS — M25512 Pain in left shoulder: Secondary | ICD-10-CM | POA: Diagnosis not present

## 2022-11-27 ENCOUNTER — Encounter: Payer: Self-pay | Admitting: Physical Therapy

## 2022-11-27 ENCOUNTER — Ambulatory Visit: Payer: 59 | Admitting: Physical Therapy

## 2022-11-27 DIAGNOSIS — M6281 Muscle weakness (generalized): Secondary | ICD-10-CM | POA: Diagnosis not present

## 2022-11-27 DIAGNOSIS — M25512 Pain in left shoulder: Secondary | ICD-10-CM

## 2022-11-27 DIAGNOSIS — M25612 Stiffness of left shoulder, not elsewhere classified: Secondary | ICD-10-CM

## 2022-11-27 DIAGNOSIS — M546 Pain in thoracic spine: Secondary | ICD-10-CM | POA: Diagnosis not present

## 2022-11-27 DIAGNOSIS — R252 Cramp and spasm: Secondary | ICD-10-CM
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
## 2022-12-04 ENCOUNTER — Ambulatory Visit: Payer: 59 | Attending: Orthopaedic Surgery

## 2022-12-04 DIAGNOSIS — M546 Pain in thoracic spine: Secondary | ICD-10-CM | POA: Diagnosis not present

## 2022-12-04 DIAGNOSIS — M6281 Muscle weakness (generalized): Secondary | ICD-10-CM | POA: Insufficient documentation

## 2022-12-04 DIAGNOSIS — M25512 Pain in left shoulder: Secondary | ICD-10-CM | POA: Diagnosis not present

## 2022-12-04 DIAGNOSIS — R252 Cramp and spasm: Secondary | ICD-10-CM

## 2022-12-04 DIAGNOSIS — M25612 Stiffness of left shoulder, not elsewhere classified: Secondary | ICD-10-CM | POA: Insufficient documentation
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
## 2022-12-05 ENCOUNTER — Other Ambulatory Visit: Payer: Self-pay

## 2022-12-05 ENCOUNTER — Other Ambulatory Visit: Payer: Self-pay | Admitting: Student in an Organized Health Care Education/Training Program

## 2022-12-05 DIAGNOSIS — I480 Paroxysmal atrial fibrillation: Secondary | ICD-10-CM

## 2022-12-05 MED ORDER — FLECAINIDE ACETATE 100 MG PO TABS: 100.0000 mg | ORAL_TABLET | Freq: Two times a day (BID) | ORAL | 0 refills | Status: DC

## 2022-12-06 NOTE — Telephone Encounter (Signed)
Next appt scheduled 6/3 with PCP. 
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
## 2022-12-12 ENCOUNTER — Ambulatory Visit: Payer: 59

## 2022-12-12 DIAGNOSIS — M25512 Pain in left shoulder: Secondary | ICD-10-CM | POA: Diagnosis not present

## 2022-12-12 DIAGNOSIS — M25612 Stiffness of left shoulder, not elsewhere classified: Secondary | ICD-10-CM

## 2022-12-12 DIAGNOSIS — M6281 Muscle weakness (generalized): Secondary | ICD-10-CM

## 2022-12-12 DIAGNOSIS — M546 Pain in thoracic spine: Secondary | ICD-10-CM

## 2022-12-12 DIAGNOSIS — R252 Cramp and spasm: Secondary | ICD-10-CM

## 2022-12-18 ENCOUNTER — Ambulatory Visit: Payer: 59 | Admitting: Physical Therapy

## 2022-12-24 ENCOUNTER — Other Ambulatory Visit: Payer: Self-pay

## 2022-12-24 DIAGNOSIS — Z79891 Long term (current) use of opiate analgesic: Secondary | ICD-10-CM

## 2022-12-24 MED ORDER — OXYCODONE-ACETAMINOPHEN 10-325 MG PO TABS: 1.0000 | ORAL_TABLET | Freq: Four times a day (QID) | ORAL | 0 refills | Status: DC | PRN
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
## 2022-12-25 ENCOUNTER — Other Ambulatory Visit: Payer: Self-pay

## 2022-12-25 ENCOUNTER — Encounter: Payer: Self-pay | Admitting: Physical Therapy

## 2022-12-25 ENCOUNTER — Ambulatory Visit: Payer: 59 | Admitting: Physical Therapy

## 2022-12-25 DIAGNOSIS — R252 Cramp and spasm: Secondary | ICD-10-CM

## 2022-12-25 DIAGNOSIS — M6281 Muscle weakness (generalized): Secondary | ICD-10-CM | POA: Diagnosis not present

## 2022-12-25 DIAGNOSIS — M25512 Pain in left shoulder: Secondary | ICD-10-CM | POA: Diagnosis not present

## 2022-12-25 DIAGNOSIS — M546 Pain in thoracic spine: Secondary | ICD-10-CM

## 2022-12-25 DIAGNOSIS — M25612 Stiffness of left shoulder, not elsewhere classified: Secondary | ICD-10-CM

## 2023-01-21 ENCOUNTER — Other Ambulatory Visit: Payer: Self-pay

## 2023-01-21 DIAGNOSIS — Z79891 Long term (current) use of opiate analgesic: Secondary | ICD-10-CM

## 2023-01-22 MED ORDER — OXYCODONE-ACETAMINOPHEN 10-325 MG PO TABS: 1.0000 | ORAL_TABLET | Freq: Four times a day (QID) | ORAL | 0 refills | Status: DC | PRN

## 2023-01-25 ENCOUNTER — Ambulatory Visit: Payer: 59 | Admitting: Nurse Practitioner

## 2023-01-28 ENCOUNTER — Other Ambulatory Visit: Payer: Self-pay | Admitting: Student in an Organized Health Care Education/Training Program

## 2023-01-28 DIAGNOSIS — I5032 Chronic diastolic (congestive) heart failure: Secondary | ICD-10-CM

## 2023-01-30 NOTE — Telephone Encounter (Signed)
Next appt scheduled 6/3 with PCP. 

## 2023-01-31 ENCOUNTER — Ambulatory Visit: Payer: 59 | Admitting: Physician Assistant

## 2023-02-04 ENCOUNTER — Encounter: Payer: Self-pay | Admitting: Physician Assistant

## 2023-02-04 ENCOUNTER — Ambulatory Visit: Payer: 59 | Attending: Nurse Practitioner | Admitting: Physician Assistant

## 2023-02-04 ENCOUNTER — Ambulatory Visit (HOSPITAL_COMMUNITY)
Admission: RE | Admit: 2023-02-04 | Discharge: 2023-02-04 | Disposition: A | Discharge status: Home / Self Care | Payer: 59 | Source: Ambulatory Visit | Attending: Internal Medicine | Admitting: Internal Medicine

## 2023-02-04 ENCOUNTER — Encounter: Payer: Self-pay | Admitting: Student in an Organized Health Care Education/Training Program

## 2023-02-04 ENCOUNTER — Ambulatory Visit (INDEPENDENT_AMBULATORY_CARE_PROVIDER_SITE_OTHER): Payer: 59 | Admitting: Student in an Organized Health Care Education/Training Program

## 2023-02-04 VITALS — BP 116/60 | HR 50 | Ht 72.0 in | Wt 303.0 lb

## 2023-02-04 VITALS — BP 131/65 | HR 52 | Temp 97.6°F | Ht 72.0 in | Wt 304.6 lb

## 2023-02-04 DIAGNOSIS — I25119 Atherosclerotic heart disease of native coronary artery with unspecified angina pectoris: Secondary | ICD-10-CM

## 2023-02-04 DIAGNOSIS — I10 Essential (primary) hypertension: Secondary | ICD-10-CM | POA: Diagnosis not present

## 2023-02-04 DIAGNOSIS — I77810 Thoracic aortic ectasia: Secondary | ICD-10-CM | POA: Insufficient documentation

## 2023-02-04 DIAGNOSIS — R42 Dizziness and giddiness: Secondary | ICD-10-CM

## 2023-02-04 DIAGNOSIS — E1140 Type 2 diabetes mellitus with diabetic neuropathy, unspecified: Secondary | ICD-10-CM | POA: Diagnosis not present

## 2023-02-04 DIAGNOSIS — Z59819 Housing instability, housed unspecified: Secondary | ICD-10-CM | POA: Diagnosis not present

## 2023-02-04 DIAGNOSIS — I480 Paroxysmal atrial fibrillation: Secondary | ICD-10-CM

## 2023-02-04 DIAGNOSIS — Z79891 Long term (current) use of opiate analgesic: Secondary | ICD-10-CM | POA: Diagnosis not present

## 2023-02-04 DIAGNOSIS — N521 Erectile dysfunction due to diseases classified elsewhere: Secondary | ICD-10-CM

## 2023-02-04 DIAGNOSIS — I5032 Chronic diastolic (congestive) heart failure: Secondary | ICD-10-CM | POA: Diagnosis not present

## 2023-02-04 DIAGNOSIS — R001 Bradycardia, unspecified: Secondary | ICD-10-CM | POA: Diagnosis not present

## 2023-02-04 DIAGNOSIS — E7800 Pure hypercholesterolemia, unspecified: Secondary | ICD-10-CM

## 2023-02-04 DIAGNOSIS — Z7984 Long term (current) use of oral hypoglycemic drugs: Secondary | ICD-10-CM

## 2023-02-04 HISTORY — DX: Thoracic aortic ectasia: I77.810

## 2023-02-04 LAB — GLUCOSE, CAPILLARY: Glucose-Capillary: 153 mg/dL — ABNORMAL HIGH (ref 70–99)

## 2023-02-04 LAB — POCT GLYCOSYLATED HEMOGLOBIN (HGB A1C): Hemoglobin A1C: 6.5 % — AB (ref 4.0–5.6)

## 2023-02-04 MED ORDER — OXYCODONE-ACETAMINOPHEN 10-325 MG PO TABS
1.0000 | ORAL_TABLET | Freq: Four times a day (QID) | ORAL | 0 refills | Status: DC | PRN
Start: 2023-02-04 — End: 2023-05-13

## 2023-02-04 MED ORDER — METOPROLOL TARTRATE 25 MG PO TABS
12.5000 mg | ORAL_TABLET | Freq: Two times a day (BID) | ORAL | 3 refills | Status: DC
Start: 2023-02-04 — End: 2023-04-08

## 2023-02-04 MED ORDER — OXYCODONE-ACETAMINOPHEN 10-325 MG PO TABS
1.0000 | ORAL_TABLET | Freq: Four times a day (QID) | ORAL | 0 refills | Status: DC | PRN
Start: 2023-02-04 — End: 2023-02-04

## 2023-02-04 NOTE — Assessment & Plan Note (Signed)
Continues to be a problem.  He reports having a safe living situation right now, but far from stable.  I wish we had more resources to offer him.

## 2023-02-04 NOTE — Assessment & Plan Note (Signed)
Chronic pain condition due to osteoarthritis in hips, knees, lower back.  He has been finding benefit from using oxycodone 10 mg 3-4 times daily for many years.  He reports that he continues to improve his functional status.  He denies any adverse side effects.  Says that he keeps it in a personal safe.  Last tox assure was appropriate.  Will continue this medication, prescribed 3 months and he will follow-up with me closely.

## 2023-02-04 NOTE — Assessment & Plan Note (Signed)
42 mm on echocardiogram in February 2023.  Obtain follow-up echocardiogram.

## 2023-02-04 NOTE — Assessment & Plan Note (Signed)
Chronic and well-controlled.  Hemoglobin A1c is at goal at 6.5% today.  Plan to continue treatment with metformin 1000 mg twice daily.  Renal protection with losartan, he does have CKD likely a complication of both diabetes, hypertension, and obesity.  Atorvastatin for primary ischemic disease prevention.

## 2023-02-04 NOTE — Assessment & Plan Note (Signed)
EF 50-55 by echocardiogram in February 2023.  NYHA IIb-III.  Volume status overall stable.  Continue torsemide 20 mg daily.  Consider addition of SGLT2 inhibitor if volume is difficult to control.  Follow-up 1 year.

## 2023-02-04 NOTE — Assessment & Plan Note (Signed)
Blood pressure well-controlled today.  Plan to continue with losartan.
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
## 2023-02-04 NOTE — Assessment & Plan Note (Signed)
Nonobstructive CAD by cardiac catheterization in 2007.  Low risk Myoview in September 2023.  He is not having chest pain to suggest angina.  He does not require antiplatelet therapy as he is on Eliquis.  Continue Lipitor 20 mg daily.

## 2023-02-04 NOTE — Patient Instructions (Signed)
Call me if the balance problem gets any worse.

## 2023-02-04 NOTE — Progress Notes (Signed)
Established Patient Office Visit  Subjective   Patient ID: Juan Calderon, male    DOB: 24-Apr-1956  Age: 67 y.o. MRN: [id number]  Chief Complaint  Patient presents with   Diabetes   Medication Refill   Follow-up    HPI  67 year old person here for follow-up of chronic pain disorder.  Doing pretty well since I last saw him.  Still having some issue with his housing, still living with friends which she says is safe for now.  Uses CPAP about 4 hours nightly.  Reports good adherence with his medications, able to keep them in a safe place.  He has 1 acute concern today of occasional feeling imbalanced while walking.  This notices that he starts to veer to the side, does not tell me if it is always to the right or left.  Does not feel dizzy or vertiginous.  No worsening orthostatic symptoms.  Has a chronically unstable gait due to osteoarthritis in his hips and knees.  Has not had any falls recently.  Denies any headaches, night sweats, or unintentional weight loss.  Reports good adherence with Eliquis on a daily basis.  No chest pain or dyspnea on exertion.  Has a pretty limited functional status due to his obesity and other comorbidities, can walk about 100 feet before he has to stop to rest.    Objective:     BP 131/65 (BP Location: Right Arm, Cuff Size: Small)   Pulse (!) 52   Temp 97.6 F (36.4 C) (Oral)   Ht 6' (1.829 m)   Wt (!) 304 lb 9.6 oz (138.2 kg)   SpO2 97%   BMI 41.31 kg/m    Physical Exam  General: Well-appearing man, no distress CV: Bradycardic, slightly irregular rhythm, 2 out of 6 early systolic murmur Lungs: Unlabored, clear throughout Extremities: Warm and well-perfused, chronic lower extremity edema is about 1+ nonpitting Neuro: Alert, conversational, full strength in the upper and lower extremities, normal finger-to-nose test, normal heel-to-shin test, slow get up and go with wide-based antalgic gait, no gait ataxia. Psych: Appropriate mood and affect, not  depressed or anxious appearing.    Assessment & Plan:   Problem List Items Addressed This Visit       High   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 (HCC) - Primary (Chronic)    Chronic and well-controlled.  Hemoglobin A1c is at goal at 6.5% today.  Plan to continue treatment with metformin 1000 mg twice daily.  Renal protection with losartan, he does have CKD likely a complication of both diabetes, hypertension, and obesity.  Atorvastatin for primary ischemic disease prevention.      Relevant Orders   POC Hbg A1C (Completed)   Paroxysmal atrial fibrillation (HCC) (Chronic)    Chronic and stable.  Comanaged with cardiology.  Tolerating anticoagulation with Eliquis twice daily for primary prevention of cerebral infarction.  Heart rate a little slow at 50 today.  EKG is sinus bradycardia with a first-degree heart block, prolonged QT, occasional PVC.  Cardiology is treating him with flecainide 100 mg twice daily.  Has had issues with chronic hypomagnesemia which we are supplementing with magnesium oxide 800 mg daily as he can tolerate.  Because of the bradycardia I am going to asked the patient to discontinue metoprolol.  He has follow-up planned with cardiology very soon as well.      Relevant Orders   EKG 12-Lead (Completed)   Chronic use of opiate drug for therapeutic purpose (Chronic)  Chronic pain condition due to osteoarthritis in hips, knees, lower back.  He has been finding benefit from using oxycodone 10 mg 3-4 times daily for many years.  He reports that he continues to improve his functional status.  He denies any adverse side effects.  Says that he keeps it in a personal safe.  Last tox assure was appropriate.  Will continue this medication, prescribed 3 months and he will follow-up with me closely.      Relevant Medications   oxyCODONE-acetaminophen (PERCOCET) 10-325 MG tablet     Medium    Essential hypertension (Chronic)    Blood pressure  well-controlled today.  Plan to continue with losartan.        Unprioritized   Housing insecurity    Continues to be a problem.  He reports having a safe living situation right now, but far from stable.  I wish we had more resources to offer him.      Dysequilibrium    New problem today of intermittent disequilibrium while walking.  Patient reports veering off to the side, unsure if it is right or left, happens a few times per week.  Neurologic exam today is normal and reassuring.  Cerebellar exam is with no signs of ataxia.  We ambulated in her hallway and could not recreate the ataxia.  He has an antalgic gait due to chronic osteoarthritis of hips and knees which I suspect is probably causing instability.  No history of cerebral infarction, he is on Eliquis for primary prophylaxis in the setting of A-fib.  Orthostatic vital signs today were normal.  EKG with sinus bradycardia, I doubt this is contributing to the imbalance.  Based on normal neurologic exam and normal gait today I think risk of CNS disease is low at this point.  If he has worsening disequilibrium, or becomes more function limiting, will order MRI brain to rule out cerebellar disease.       Return in about 3 months (around 05/07/2023).    Tyson Alias, MD

## 2023-02-04 NOTE — Assessment & Plan Note (Signed)
Blood pressure is well-controlled.  Continue losartan 50 mg daily, terazosin 5 mg daily.  Resume metoprolol tartrate as noted.

## 2023-02-04 NOTE — Assessment & Plan Note (Signed)
This has been documented in the past.  He is asymptomatic.  As noted, I will reduce his metoprolol to tartrate to 12.5 mg twice daily.  If he develops symptoms or worsening bradycardia in the future, consider 3-day Zio patch monitor to further assess.

## 2023-02-04 NOTE — Patient Instructions (Signed)
Medication Instructions:   START TAKING:  METOPROLOL 12.5 MG TWICE A DAY   *If you need a refill on your cardiac medications before your next appointment, please call your pharmacy*   Lab Work: LFT AND CBC TODAY    If you have labs (blood work) drawn today and your tests are completely normal, you will receive your results only by: MyChart Message (if you have MyChart) OR A paper copy in the mail If you have any lab test that is abnormal or we need to change your treatment, we will call you to review the results.   Testing/Procedures: Your physician has requested that you have an echocardiogram. Echocardiography is a painless test that uses sound waves to create images of your heart. It provides your doctor with information about the size and shape of your heart and how well your heart's chambers and valves are working. This procedure takes approximately one hour. There are no restrictions for this procedure. Please do NOT wear cologne, perfume, aftershave, or lotions (deodorant is allowed). Please arrive 15 minutes prior to your appointment time.      Follow-Up: At [facility], you and your health needs are our priority.  As part of our continuing mission to provide you with exceptional heart care, we have created designated Provider Care Teams.  These Care Teams include your primary Cardiologist (physician) and Advanced Practice Providers (APPs -  Physician Assistants and Nurse Practitioners) who all work together to provide you with the care you need, when you need it.  We recommend signing up for the patient portal called "MyChart".  Sign up information is provided on this After Visit Summary.  MyChart is used to connect with patients for Virtual Visits (Telemedicine).  Patients are able to view lab/test results, encounter notes, upcoming appointments, etc.  Non-urgent messages can be sent to your provider as well.   To learn more about what you can do with MyChart, go to  [email or web address].    Your next appointment:   1 year(s)  Provider:   Verne Carrow, MD     Other Instructions

## 2023-02-04 NOTE — Assessment & Plan Note (Addendum)
Chronic and stable.  Comanaged with cardiology.  Tolerating anticoagulation with Eliquis twice daily for primary prevention of cerebral infarction.  Heart rate a little slow at 50 today.  EKG is sinus bradycardia with a first-degree heart block, prolonged QT, occasional PVC.  Cardiology is treating him with flecainide 100 mg twice daily.  Has had issues with chronic hypomagnesemia which we are supplementing with magnesium oxide 800 mg daily as he can tolerate.  Because of the bradycardia I am going to asked the patient to discontinue metoprolol.  He has follow-up planned with cardiology very soon as well.

## 2023-02-04 NOTE — Assessment & Plan Note (Signed)
Maintaining sinus rhythm.  He was noted to have bradycardia on his electrocardiogram earlier today.  He does require some type of rate control therapy while on chronic flecainide therapy.  Instead of discontinuing his metoprolol, I will continue him on metoprolol tartrate 12.5 mg twice daily.  Continue flecainide 100 mg twice daily.  Based upon age, weight and creatinine, continue Eliquis 5 mg twice daily.  Obtain follow-up CBC today.

## 2023-02-04 NOTE — Assessment & Plan Note (Addendum)
New problem today of intermittent disequilibrium while walking.  Patient reports veering off to the side, unsure if it is right or left, happens a few times per week.  Neurologic exam today is normal and reassuring.  Cerebellar exam is with no signs of ataxia.  We ambulated in her hallway and could not recreate the ataxia.  He has an antalgic gait due to chronic osteoarthritis of hips and knees which I suspect is probably causing instability.  No history of cerebral infarction, he is on Eliquis for primary prophylaxis in the setting of A-fib.  Orthostatic vital signs today were normal.  EKG with sinus bradycardia, I doubt this is contributing to the imbalance.  Based on normal neurologic exam and normal gait today I think risk of CNS disease is low at this point.  If he has worsening disequilibrium, or becomes more function limiting, will order MRI brain to rule out cerebellar disease.

## 2023-02-04 NOTE — Assessment & Plan Note (Signed)
Labs from primary care reviewed.  LDL in March 2024 was at goal at 36.  Continue Lipitor 20 mg daily.  Obtain follow-up LFTs.

## 2023-02-05 LAB — HEPATIC FUNCTION PANEL
ALT: 12 IU/L (ref 0–44)
AST: 15 IU/L (ref 0–40)
Albumin: 4.2 g/dL (ref 3.9–4.9)
Alkaline Phosphatase: 78 IU/L (ref 44–121)
Bilirubin Total: 0.5 mg/dL (ref 0.0–1.2)
Bilirubin, Direct: 0.16 mg/dL (ref 0.00–0.40)
Total Protein: 6.4 g/dL (ref 6.0–8.5)

## 2023-02-05 LAB — CBC
Hematocrit: 35.6 % — ABNORMAL LOW (ref 37.5–51.0)
Hemoglobin: 11.8 g/dL — ABNORMAL LOW (ref 13.0–17.7)
MCH: 31.8 pg (ref 26.6–33.0)
MCHC: 33.1 g/dL (ref 31.5–35.7)
MCV: 96 fL (ref 79–97)
Platelets: 195 10*3/uL (ref 150–450)
RBC: 3.71 x10E6/uL — ABNORMAL LOW (ref 4.14–5.80)
RDW: 13.9 % (ref 11.6–15.4)
WBC: 7.6 10*3/uL (ref 3.4–10.8)

## 2023-02-12 DIAGNOSIS — G4733 Obstructive sleep apnea (adult) (pediatric): Secondary | ICD-10-CM | POA: Diagnosis not present

## 2023-02-22 ENCOUNTER — Other Ambulatory Visit: Payer: Self-pay | Admitting: Student in an Organized Health Care Education/Training Program

## 2023-02-22 DIAGNOSIS — I480 Paroxysmal atrial fibrillation: Secondary | ICD-10-CM

## 2023-02-26 NOTE — Telephone Encounter (Signed)
I am going to forward this request to the patient's cardiology/EP team. Thank you.

## 2023-02-28 NOTE — Telephone Encounter (Signed)
Pt recently seen in office for follow up. Flecainide continued. Will refill Rx.

## 2023-03-01 ENCOUNTER — Other Ambulatory Visit: Payer: Self-pay | Admitting: Student in an Organized Health Care Education/Training Program

## 2023-03-01 DIAGNOSIS — M1A00X0 Idiopathic chronic gout, unspecified site, without tophus (tophi): Secondary | ICD-10-CM

## 2023-03-03 ENCOUNTER — Other Ambulatory Visit: Payer: Self-pay | Admitting: Student in an Organized Health Care Education/Training Program

## 2023-03-03 DIAGNOSIS — G609 Hereditary and idiopathic neuropathy, unspecified: Secondary | ICD-10-CM

## 2023-03-06 ENCOUNTER — Ambulatory Visit (HOSPITAL_COMMUNITY): Payer: 59 | Attending: Internal Medicine

## 2023-03-06 ENCOUNTER — Encounter: Payer: Self-pay | Admitting: Physician Assistant

## 2023-03-06 DIAGNOSIS — I25119 Atherosclerotic heart disease of native coronary artery with unspecified angina pectoris: Secondary | ICD-10-CM | POA: Diagnosis not present

## 2023-03-06 DIAGNOSIS — I5032 Chronic diastolic (congestive) heart failure: Secondary | ICD-10-CM

## 2023-03-06 LAB — ECHOCARDIOGRAM COMPLETE
Area-P 1/2: 3.81 cm2
S' Lateral: 4.1 cm

## 2023-04-08 ENCOUNTER — Other Ambulatory Visit: Payer: Self-pay | Admitting: Internal Medicine

## 2023-04-08 DIAGNOSIS — I25119 Atherosclerotic heart disease of native coronary artery with unspecified angina pectoris: Secondary | ICD-10-CM

## 2023-04-08 NOTE — Telephone Encounter (Signed)
Next appt scheduled 05/13/23 with PCP.

## 2023-04-24 ENCOUNTER — Other Ambulatory Visit: Payer: Self-pay | Admitting: Student in an Organized Health Care Education/Training Program

## 2023-04-24 DIAGNOSIS — G609 Hereditary and idiopathic neuropathy, unspecified: Secondary | ICD-10-CM

## 2023-05-05 ENCOUNTER — Other Ambulatory Visit: Payer: Self-pay | Admitting: Student in an Organized Health Care Education/Training Program

## 2023-05-05 DIAGNOSIS — J4489 Other specified chronic obstructive pulmonary disease: Secondary | ICD-10-CM

## 2023-05-05 DIAGNOSIS — N521 Erectile dysfunction due to diseases classified elsewhere: Secondary | ICD-10-CM

## 2023-05-07 DIAGNOSIS — G4733 Obstructive sleep apnea (adult) (pediatric): Secondary | ICD-10-CM | POA: Diagnosis not present

## 2023-05-13 ENCOUNTER — Encounter: Payer: Self-pay | Admitting: Student in an Organized Health Care Education/Training Program

## 2023-05-13 ENCOUNTER — Ambulatory Visit (INDEPENDENT_AMBULATORY_CARE_PROVIDER_SITE_OTHER): Payer: 59 | Admitting: Student in an Organized Health Care Education/Training Program

## 2023-05-13 VITALS — BP 144/58 | HR 52 | Temp 97.5°F | Ht 73.0 in | Wt 305.4 lb

## 2023-05-13 DIAGNOSIS — I10 Essential (primary) hypertension: Secondary | ICD-10-CM

## 2023-05-13 DIAGNOSIS — K529 Noninfective gastroenteritis and colitis, unspecified: Secondary | ICD-10-CM | POA: Insufficient documentation

## 2023-05-13 DIAGNOSIS — Z79891 Long term (current) use of opiate analgesic: Secondary | ICD-10-CM

## 2023-05-13 DIAGNOSIS — Z23 Encounter for immunization: Secondary | ICD-10-CM

## 2023-05-13 DIAGNOSIS — G4733 Obstructive sleep apnea (adult) (pediatric): Secondary | ICD-10-CM

## 2023-05-13 DIAGNOSIS — M21372 Foot drop, left foot: Secondary | ICD-10-CM | POA: Insufficient documentation

## 2023-05-13 DIAGNOSIS — E1140 Type 2 diabetes mellitus with diabetic neuropathy, unspecified: Secondary | ICD-10-CM

## 2023-05-13 DIAGNOSIS — I480 Paroxysmal atrial fibrillation: Secondary | ICD-10-CM | POA: Diagnosis not present

## 2023-05-13 DIAGNOSIS — N521 Erectile dysfunction due to diseases classified elsewhere: Secondary | ICD-10-CM | POA: Diagnosis not present

## 2023-05-13 LAB — POCT GLYCOSYLATED HEMOGLOBIN (HGB A1C): Hemoglobin A1C: 6.9 % — AB (ref 4.0–5.6)

## 2023-05-13 LAB — GLUCOSE, CAPILLARY: Glucose-Capillary: 199 mg/dL — ABNORMAL HIGH (ref 70–99)

## 2023-05-13 MED ORDER — OXYCODONE-ACETAMINOPHEN 10-325 MG PO TABS: 1.0000 | ORAL_TABLET | Freq: Four times a day (QID) | ORAL | 0 refills | Status: DC | PRN

## 2023-05-13 MED ORDER — MAGNESIUM OXIDE -MG SUPPLEMENT 400 (240 MG) MG PO TABS: 1.0000 | ORAL_TABLET | Freq: Every day | ORAL | Status: DC

## 2023-05-13 NOTE — Assessment & Plan Note (Signed)
Diabetes well-controlled, A1c 6.9%.  Plan to continue with metformin.

## 2023-05-13 NOTE — Patient Instructions (Signed)
Thank you, Juan Calderon for allowing [place] to provide your care today. Today we discussed the following.   Diarrhea - You can try Imodium (1 tablet 2-3 times a day) and reduce magnesium to 400 mg once a day. If symptoms get worse, please give [place] a call.  Leg pain - We have referred you to [facility].  Labs: We are checking labs for electrolytes, glucose, thyroids and gluten sensitivity.  You received your flu vaccine today.  Follow up in 3 months.   I have ordered the following labs for you:  Lab Orders         Glucose, capillary         CMP14 + Anion Gap         TSH         Tissue Transglutaminase Abs,IgG,IgA         POC Hbg A1C      Referrals ordered today:   Referral Orders         Ambulatory referral to Orthopedic Surgery       I have ordered the following medication/changed the following medications:   Stop the following medications: Medications Discontinued During This Encounter  Medication Reason   Sorbitol 70 % SOLN    methocarbamol (ROBAXIN) 500 MG tablet    magnesium oxide (MAG-OX) 400 (240 Mg) MG tablet      Start the following medications: Meds ordered this encounter  Medications   magnesium oxide (MAG-OX) 400 (240 Mg) MG tablet    Sig: Take 1 tablet (400 mg total) by mouth daily.     Follow up: 3 months   Remember:  We look forward to seeing you next time. Please call our clinic at [phone] if you have any questions or concerns. The best time to call is Monday-Friday from 9am-4pm, but there is someone available 24/7. If after hours or the weekend, call the main hospital number and ask for the Internal Medicine Resident On-Call. If you need medication refills, please notify your pharmacy one week in advance and they will send [place] a request.   Thank you for trusting [place] with your care. Wishing you the best! [facility]
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
## 2023-05-13 NOTE — Assessment & Plan Note (Signed)
Patient doing well with oxycodone 10 mg 4 times daily for chronic pain associated with traumatic osteoarthritis.  I reviewed the database which was appropriate.  He is having some increased falls, but I think this is related to a left foot drop rather than a CNS effect of this medication.  Going to refer him to orthopedics to help with bracing which I hope will lower his risk of falls.  Will continue with oxycodone for now, but may need to reduce the dose in the future if the falls do not improve.  I sent a 51-month supply to his pharmacy.

## 2023-05-13 NOTE — Assessment & Plan Note (Signed)
Severe obstructive sleep apnea, chronic issue, not well-controlled due to intolerance of sleep apnea masks.  He continues to try his best to use the facemask, has worked with the DME companies to try the different mask products.  Starting to have some headaches in the back of his head which almost certainly related to untreated sleep apnea.
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
## 2023-05-13 NOTE — Assessment & Plan Note (Signed)
New problem.  Patient with 3 months of 4-6 loose stools on a daily basis.  Mostly in the mornings.  I am reassured that this is not associated with abdominal pain, fevers, or hematochezia.  I wonder if it is related to medications, perhaps magnesium oxide or metformin.  Will reduce magnesium oxide to 400 mg once daily.  He has severe hypomagnesemia so I would like to keep some amount of supplemental magnesium, especially given his history of heart disease on flecainide.  I recommended that he avoid lactose in his diet.  Will check an anti-tTG.  He had a colonoscopy 1 year ago with healthy appearing colon.  Low suspicion for inflammatory bowel disease or infectious colitis based on exam today.  Recommended supportive care with Imodium.  Counseled caution given he is on chronic opiates.

## 2023-05-14 LAB — CMP14 + ANION GAP
ALT: 15 IU/L (ref 0–44)
AST: 13 IU/L (ref 0–40)
Albumin: 4.2 g/dL (ref 3.9–4.9)
Alkaline Phosphatase: 88 IU/L (ref 44–121)
Anion Gap: 19 mmol/L — ABNORMAL HIGH (ref 10.0–18.0)
BUN/Creatinine Ratio: 21 (ref 10–24)
BUN: 48 mg/dL — ABNORMAL HIGH (ref 8–27)
Bilirubin Total: 0.4 mg/dL (ref 0.0–1.2)
CO2: 19 mmol/L — ABNORMAL LOW (ref 20–29)
Calcium: 9.6 mg/dL (ref 8.6–10.2)
Chloride: 101 mmol/L (ref 96–106)
Creatinine, Ser: 2.31 mg/dL — ABNORMAL HIGH (ref 0.76–1.27)
Globulin, Total: 2.5 g/dL (ref 1.5–4.5)
Glucose: 182 mg/dL — ABNORMAL HIGH (ref 70–99)
Potassium: 4.8 mmol/L (ref 3.5–5.2)
Sodium: 139 mmol/L (ref 134–144)
Total Protein: 6.7 g/dL (ref 6.0–8.5)
eGFR: 30 mL/min/{1.73_m2} — ABNORMAL LOW (ref >59)

## 2023-05-14 LAB — TSH: TSH: 2.69 u[IU]/mL (ref 0.450–4.500)

## 2023-05-14 LAB — TISSUE TRANSGLUTAMINASE ABS,IGG,IGA
Tissue Transglut Ab: 5 U/mL (ref 0–5)
Transglutaminase IgA: <2 U/mL (ref 0–3)

## 2023-05-15 ENCOUNTER — Encounter: Payer: Self-pay | Admitting: Student in an Organized Health Care Education/Training Program

## 2023-05-31 DIAGNOSIS — M1732 Unilateral post-traumatic osteoarthritis, left knee: Secondary | ICD-10-CM | POA: Diagnosis not present

## 2023-05-31 DIAGNOSIS — M1731 Unilateral post-traumatic osteoarthritis, right knee: Secondary | ICD-10-CM | POA: Diagnosis not present

## 2023-05-31 DIAGNOSIS — M21372 Foot drop, left foot: Secondary | ICD-10-CM | POA: Diagnosis not present

## 2023-06-14 DIAGNOSIS — M25572 Pain in left ankle and joints of left foot: Secondary | ICD-10-CM | POA: Diagnosis not present

## 2023-06-19 ENCOUNTER — Ambulatory Visit: Payer: 59 | Admitting: Physical Therapy

## 2023-06-24 ENCOUNTER — Ambulatory Visit (INDEPENDENT_AMBULATORY_CARE_PROVIDER_SITE_OTHER): Payer: 59 | Admitting: Student in an Organized Health Care Education/Training Program

## 2023-06-24 ENCOUNTER — Encounter: Payer: Self-pay | Admitting: Student in an Organized Health Care Education/Training Program

## 2023-06-24 ENCOUNTER — Other Ambulatory Visit: Payer: Self-pay

## 2023-06-24 VITALS — BP 142/68 | HR 56 | Temp 97.8°F | Ht 73.0 in | Wt 305.9 lb

## 2023-06-24 DIAGNOSIS — M21372 Foot drop, left foot: Secondary | ICD-10-CM

## 2023-06-24 DIAGNOSIS — R109 Unspecified abdominal pain: Secondary | ICD-10-CM

## 2023-06-24 DIAGNOSIS — Z79891 Long term (current) use of opiate analgesic: Secondary | ICD-10-CM

## 2023-06-24 DIAGNOSIS — N1831 Chronic kidney disease, stage 3a: Secondary | ICD-10-CM

## 2023-06-24 MED ORDER — SENNA 8.6 MG PO TABS: 2.0000 | ORAL_TABLET | Freq: Every day | ORAL | 2 refills | Status: AC

## 2023-06-24 MED ORDER — OXYCODONE-ACETAMINOPHEN 10-325 MG PO TABS: 1.0000 | ORAL_TABLET | Freq: Four times a day (QID) | ORAL | 0 refills | Status: DC | PRN

## 2023-06-24 NOTE — Assessment & Plan Note (Signed)
Mild to moderate left lower quadrant abdominal discomfort has been an intermittent issue over the last 1 month.  He describes it as a crampy sensation.  Not impacting his function.  Exam is reassuring.  I suspect this may be related to chronic constipation from longstanding opioid use.  He has also had issues with occasional diarrhea.  He has been using extra laxatives including sorbitol and milk of magnesia which we discussed can contribute to abdominal cramps.  I recommended discontinuing those agents and starting senna which hopefully will have less cramping and still allow for 1 soft bowel movement daily.  He had a normal colonoscopy 1 year ago, no other symptoms to suggest inflammatory bowel disease.  He has got no systemic symptoms to suggest diverticulitis right now.  I think if the discomfort became worse, became function limiting, or started to be associated with fevers, would pursue a CT abdomen to rule out diverticulitis or other intra-abdominal process.

## 2023-06-24 NOTE — Progress Notes (Signed)
Acute Office Visit  Subjective:     Patient ID: Juan Calderon, male    DOB: 1956-06-29, 67 y.o.   MRN: [id number]  Chief Complaint  Patient presents with   Abdominal Pain    HPI  Patient is in today for left lower quadrant abdominal pain.  Patient reports a crampy discomfort in his left lower abdomen over the last 1 month.  Happens intermittently, usually lasts the entire day and then will resolve spontaneously.  Not associated with nausea or vomiting.  Good appetite, no food aversion.  No fevers or chills.  No diarrhea, continues to have problems with chronic constipation.  Denies hematochezia or melena.  Denies any pain in the hip.  Ambulating normally.  No other changes in his medications.  Denies any chest discomfort or shortness of breath.  At our last visit 6 weeks ago he was experiencing diarrhea for several weeks.  We did evaluation at that time including a CMP, TSH, and anti-tTG antibodies which were all normal.  He did appear more dehydrated at last visit with a slight increase in his creatinine up to 2.3.  We treated that supportively and the diarrhea improved with Imodium.  Few weeks later he started to develop constipation again which has been a chronic issue for him on oxycodone.  He took several doses of sorbitol from expired bottles that he had at home which improved to the constipation.  He also used a bottle of milk of magnesia that was also expired.  Started having the crampy type pain shortly after using these agents.     Objective:    BP (!) 142/68   Pulse (!) 56   Temp 97.8 F (36.6 C) (Oral)   Ht 6\' 1"  (1.854 m)   Wt (!) 305 lb 14.4 oz (138.8 kg)   SpO2 99%   BMI 40.36 kg/m    Physical Exam  Gen: Well-appearing man, no distress Neck: Normal thyroid, no lymphadenopathy CV: Regular rate, no murmurs, distant heart sounds Lungs: Normal work of breathing, normal lung sounds at the bases Abdomen: Obese, soft, nontender to palpation, some discomfort with deep  palpation in the left lower quadrant Extremities: No lower extremity edema, normal range of motion of the left hip      Assessment & Plan:   Problem List Items Addressed This Visit       Medium    Chronic kidney disease (CKD), stage III (moderate) (HCC) (Chronic)    History of chronic kidney disease stage IIIa, GFR was lower at last visit in the setting of acute diarrhea.  Will plan to recheck BMP today and hopefully will see the GFR back to its baseline.      Relevant Orders   BMP8+Anion Gap     Unprioritized   Left foot drop - Primary    Left foot drop is being managed with [facility].  He has a cam boot in place right now which has improved the foot drop and seems to lower his risk of falls.      Abdominal discomfort    Mild to moderate left lower quadrant abdominal discomfort has been an intermittent issue over the last 1 month.  He describes it as a crampy sensation.  Not impacting his function.  Exam is reassuring.  I suspect this may be related to chronic constipation from longstanding opioid use.  He has also had issues with occasional diarrhea.  He has been using extra laxatives including sorbitol and milk of magnesia  which we discussed can contribute to abdominal cramps.  I recommended discontinuing those agents and starting senna which hopefully will have less cramping and still allow for 1 soft bowel movement daily.  He had a normal colonoscopy 1 year ago, no other symptoms to suggest inflammatory bowel disease.  He has got no systemic symptoms to suggest diverticulitis right now.  I think if the discomfort became worse, became function limiting, or started to be associated with fevers, would pursue a CT abdomen to rule out diverticulitis or other intra-abdominal process.       Meds ordered this encounter  Medications   senna (SENOKOT) 8.6 MG TABS tablet    Sig: Take 2 tablets (17.2 mg total) by mouth daily.    Dispense:  120 tablet    Refill:  2    No  follow-ups on file.  Tyson Alias, MD

## 2023-06-24 NOTE — Assessment & Plan Note (Signed)
History of chronic kidney disease stage IIIa, GFR was lower at last visit in the setting of acute diarrhea.  Will plan to recheck BMP today and hopefully will see the GFR back to its baseline.

## 2023-06-24 NOTE — Assessment & Plan Note (Signed)
Left foot drop is being managed with [facility].  He has a cam boot in place right now which has improved the foot drop and seems to lower his risk of falls.

## 2023-06-25 LAB — BMP8+ANION GAP
Anion Gap: 19 mmol/L — ABNORMAL HIGH (ref 10.0–18.0)
BUN/Creatinine Ratio: 20 (ref 10–24)
BUN: 32 mg/dL — ABNORMAL HIGH (ref 8–27)
CO2: 21 mmol/L (ref 20–29)
Calcium: 9.5 mg/dL (ref 8.6–10.2)
Chloride: 99 mmol/L (ref 96–106)
Creatinine, Ser: 1.63 mg/dL — ABNORMAL HIGH (ref 0.76–1.27)
Glucose: 207 mg/dL — ABNORMAL HIGH (ref 70–99)
Potassium: 4.3 mmol/L (ref 3.5–5.2)
Sodium: 139 mmol/L (ref 134–144)
eGFR: 46 mL/min/{1.73_m2} — ABNORMAL LOW (ref >59)

## 2023-06-26 ENCOUNTER — Other Ambulatory Visit: Payer: Self-pay | Admitting: Student in an Organized Health Care Education/Training Program

## 2023-06-27 DIAGNOSIS — G4733 Obstructive sleep apnea (adult) (pediatric): Secondary | ICD-10-CM | POA: Diagnosis not present

## 2023-07-01 ENCOUNTER — Encounter: Payer: Self-pay | Admitting: Student in an Organized Health Care Education/Training Program

## 2023-07-04 ENCOUNTER — Telehealth: Payer: Self-pay | Admitting: *Deleted

## 2023-07-04 MED ORDER — MAGNESIUM OXIDE -MG SUPPLEMENT 400 (240 MG) MG PO TABS: 2.0000 | ORAL_TABLET | Freq: Every day | ORAL | 3 refills | Status: AC

## 2023-07-04 NOTE — Telephone Encounter (Signed)
RTC to patient advised him to take 2 tablets a day if he has diarrhea than do 1 tablet daily.  Patient voiced understanding of and informed that refill had been sent to the Pharmacy.

## 2023-07-04 NOTE — Telephone Encounter (Signed)
Two tablets of magnesium daily is good, unless he has diarrhea, then would drop back to just one tablet daily.

## 2023-07-18 ENCOUNTER — Telehealth: Payer: Self-pay | Admitting: Student in an Organized Health Care Education/Training Program

## 2023-07-18 NOTE — Telephone Encounter (Signed)
RTC to patient said that his abdominal pain has returned.  No Nausea or vomiting.  No gas as well. Patient was offered an appointment for tomorrow at 8:45 AM refused.  Informed patient that if we have another opening that it would not be with Dr. Oswaldo Done.  Patient stated that would be ok to see another doctor.

## 2023-07-18 NOTE — Telephone Encounter (Signed)
Pt states he was last seen for pain in his abdomen on 06/24/2023. Pt states it went away and now it is back again. Pt states he is having sharp pains when he moves, when he lays down, or when he is walking.   Pt states the pain has gotten worse and he would like call back.

## 2023-07-19 ENCOUNTER — Ambulatory Visit (INDEPENDENT_AMBULATORY_CARE_PROVIDER_SITE_OTHER): Payer: 59 | Admitting: Student

## 2023-07-19 VITALS — BP 138/62 | HR 78 | Temp 98.0°F | Ht 73.0 in | Wt 304.0 lb

## 2023-07-19 DIAGNOSIS — R109 Unspecified abdominal pain: Secondary | ICD-10-CM

## 2023-07-19 DIAGNOSIS — R1032 Left lower quadrant pain: Secondary | ICD-10-CM

## 2023-07-19 NOTE — Patient Instructions (Signed)
Thank you so much for coming to the clinic today!   For your abdominal pain, we are getting a scan of your abdomen, as well as some blood work to see if there's any infectious process going on. I will give you a phone call with the results when they are available.   If you have any questions please feel free to the call the clinic at anytime at [phone]. It was a pleasure seeing you!  Best, Dr. Thomasene Ripple

## 2023-07-19 NOTE — Assessment & Plan Note (Addendum)
Patient evaluated on October 21 for mild to moderate left lower quadrant abdominal discomfort he has been having.  He was given senna, and his abdominal pain went away until last week where he started noticing his left lower quadrant pain again.  He states it mostly hurts when he is walking or sitting down.  He has been having regular bowel movements with the senna, albeit loose.  He denies any fevers, chills, new falls.  He denies any new food trucks, or any restaurants.  He denies any nausea or vomiting.  His last bowel movement was this morning, and before that was yesterday.  On my exam, he has mild tenderness to palpation in the left lower quadrant, normal active bowel sounds, and abdomen is not distended.  Overall, given return of symptoms, will further evaluate for diverticulitis with CT abdomen and CBC. If WBC >20 will initiate antibiotics, however reassuring that pt has not had systemic symptoms.

## 2023-07-19 NOTE — Progress Notes (Signed)
CC: Abdominal Pain   HPI:  Juan Calderon is a 67 y.o. male living with a history stated below and presents today for abdominal pain. Please see problem based assessment and plan for additional details.  Past Medical History:  Diagnosis Date   A-fib [facility])    Alcohol abuse     Ascending aorta dilation (HCC) 02/04/2023   TTE 10/2021: 42 mm  TTE 03/06/2023: EF 60-65, no RWMA, moderate LVH, normal RVSF, small pericardial effusion, RAP 3, normal aorta size (root 37 mm, ascending aorta 39 mm)   Asthma    Bradycardia    C6 radiculopathy 01/24/2016   Right upper extremity, mild to moderate electrically by EMG on 01/24/2016   Cataract    Left eye   Chronic diastolic heart failure (HCC)     with mild left ventricular hypertrophy on Echo 02/2010   Chronic kidney disease 02/28/2015   Chronic obstructive pulmonary disease (HCC)     Chronic osteomyelitis of femur (HCC) 04/06/2016   Chronic osteomyelitis of left femur (HCC) 11/22/2017   Left femur s/p prior trauma   Chronic osteomyelitis of left femur (HCC) 11/22/2017   Brodie's abscess: left femur s/p prior trauma.  Underwent partial excision and curettage of left femoral osteomyelitis at [facility] 12/30/2017 with grossly purulent material encountered within the medullary canal of the left distal femur.  Cultures grew MSSA.  Post-operatively received 6 weeks of IV antibiotics through 02/10/2018.  CRP elevated at 60.3 at end of IV antibiotic course so continued on Keflex   Chronic pain syndrome     Left arm and leg s/p traumatic injury    Chronic renal insufficiency     Coronary artery disease     25% LAD stenosis on cath 2007.  Stable angina.   Diverticulosis     Diverticulosis 11/12/2013   Essential hypertension     Frequent PVCs    Gastroesophageal reflux disease     Gout     Hyperlipidemia LDL goal < 100     Internal hemorrhoids without complication 08/12/2018   Long-term current use of opiate analgesic 09/07/2016   Mild carpal tunnel  syndrome of right wrist 01/24/2016   Mild degree electrically per EMG 01/24/2016    Mild carpal tunnel syndrome of right wrist 01/24/2016   Mild degree electrically per EMG 01/24/2016    Morbid obesity with BMI of 40.0-44.9, adult (HCC)     Normocytic anemia     NSVT (nonsustained ventricular tachycardia) (HCC)    Obstructive sleep apnea     Moderate, AHI 29.8 per hour with moderately loud snoring and oxygen desaturation to a nadir of 79%. CPAP titration resulted in a prescription for 17 CWP.     Open-angle glaucoma     Osteoarthritis cervical spine     Osteoarthritis of left knee 06/19/2013   Tricompartmental disease.  Treated with double hinged upright knee brace, steroid/xylocaine knee injections, and NSAIDs    Osteoporosis 05/14/2017   s/p fracture of the right humerus from a fall at ground hight   Persistent atrial fibrillation (HCC)    Right rotator cuff tear     Large full-thickness tear of the supraspinatus with mild retraction but no atrophy    Right rotator cuff tear 04/25/2013   Large full-thickness tear of the supraspinatus with mild retraction but no atrophy     Secondary male hypogonadism 02/07/2017   Likely secondary to chronic opioid use   Secondary male hypogonadism 02/07/2017   Likely secondary to chronic opioid use  Sleep apnea    Subclinical hypothyroidism     Tubular adenoma of colon 11/22/2017   Specifics unknown.  Repeat colonoscopy 08/12/2018 with six 3-6 mm tubular adenomas removed endoscopically.   Type II diabetes mellitus with neuropathy causing erectile dysfunction (HCC)     Vasomotor rhinitis 04/25/2013    Current Outpatient Medications on File Prior to Visit  Medication Sig Dispense Refill   albuterol (VENTOLIN HFA) 108 (90 Base) MCG/ACT inhaler INHALE 1-2 PUFFS INTO THE LUNGS EVERY 6 (SIX) HOURS AS NEEDED FOR SHORTNESS OF BREATH. 8.5 each 5   allopurinol (ZYLOPRIM) 300 MG tablet TAKE 1 TABLET BY MOUTH EVERY DAY 90 tablet 3   atorvastatin (LIPITOR) 20  MG tablet TAKE 1 TABLET BY MOUTH EVERY DAY 90 tablet 3   Blood Glucose Monitoring Suppl (ONETOUCH VERIO) w/Device KIT 1 each by Does not apply route daily. 1 kit 0   Calcium Citrate-Vitamin D 315-5 MG-MCG TABS TAKE 2 TABLETS BY MOUTH EVERY DAY 180 tablet 3   [employer] VITAMIN B12 1000 MCG tablet TAKE 1 TABLET BY MOUTH EVERY DAY 90 tablet 3   ELIQUIS 5 MG TABS tablet TAKE 1 TABLET BY MOUTH TWICE A DAY 60 tablet 5   famotidine (PEPCID) 40 MG tablet TAKE 1 TABLET BY MOUTH EVERY DAY 90 tablet 3   flecainide (TAMBOCOR) 100 MG tablet TAKE 1 TABLET BY MOUTH TWICE A DAY 180 tablet 3   glucose blood (ONETOUCH VERIO) test strip Use as instructed 100 each 12   latanoprost (XALATAN) 0.005 % ophthalmic solution Place 1 drop into both eyes at bedtime. 7.5 mL 2   losartan (COZAAR) 50 MG tablet TAKE 1 TABLET BY MOUTH EVERY DAY 90 tablet 3   magnesium oxide (MAG-OX) 400 (240 Mg) MG tablet Take 2 tablets (800 mg total) by mouth daily. 180 tablet 3   metFORMIN (GLUCOPHAGE) 1000 MG tablet TAKE 1 TABLET (1,000 MG TOTAL) BY MOUTH TWICE A DAY WITH FOOD 180 tablet 3   metoprolol tartrate (LOPRESSOR) 25 MG tablet TAKE 1 TABLET BY MOUTH TWICE A DAY 180 tablet 3   nitroGLYCERIN (NITROSTAT) 0.4 MG SL tablet Place 1 tablet (0.4 mg total) under the tongue every 5 (five) minutes as needed for chest pain. 25 tablet 1   ONETOUCH DELICA LANCETS 33G MISC Use 1 strip daily 100 each 5   oxyCODONE-acetaminophen (PERCOCET) 10-325 MG tablet Take 1 tablet by mouth every 6 (six) hours as needed for pain. 100 tablet 0   senna (SENOKOT) 8.6 MG TABS tablet Take 2 tablets (17.2 mg total) by mouth daily. 120 tablet 2   tadalafil (CIALIS) 10 MG tablet Take 1 tablet (10 mg total) by mouth as needed for erectile dysfunction. 30 tablet 1   terazosin (HYTRIN) 5 MG capsule Take 1 capsule (5 mg total) by mouth at bedtime. 90 capsule 3   torsemide (DEMADEX) 20 MG tablet TAKE 1 TABLET BY MOUTH EVERY DAY 90 tablet 3   Wound Dressings (SORBSAN WOUND DRESSING)  PADS Apply 10 each topically daily. 10 each 0   No current facility-administered medications on file prior to visit.    Family History  Problem Relation Age of Onset   Heart failure Mother    Asthma Mother    Alzheimer's disease Father    Hypertension Sister    Hypertension Sister    Hypertension Sister    Cancer Brother    Cancer Brother        unknown   Early death Brother  Gun Shot Wound   Cancer Brother        prostate   Prostate cancer Brother    Arthritis Son    Heart attack Neg Hx    Stroke Neg Hx    Colon cancer Neg Hx    Esophageal cancer Neg Hx    Pancreatic cancer Neg Hx    Stomach cancer Neg Hx    Liver disease Neg Hx    Rectal cancer Neg Hx     Social History   Socioeconomic History   Marital status: Widowed    Spouse name: Not on file   Number of children: Not on file   Years of education: Not on file   Highest education level: Not on file  Occupational History   Occupation: Disabled  Tobacco Use   Smoking status: Never   Smokeless tobacco: Never  Vaping Use   Vaping status: Never Used  Substance and Sexual Activity   Alcohol use: Yes    Alcohol/week: 14.0 standard drinks of alcohol    Types: 14 Cans of beer per week   Drug use: No   Sexual activity: Not Currently  Other Topics Concern   Not on file  Social History Narrative   Current Social History 12/14/2020      Patient lives with a friend in a home which is 1 story. There are 4 steps with handrails up to the entrance the patient uses.       Patient's method of transportation is personal car.      The highest level of education was high school diploma.      The patient currently disabled.      Identified important Relationships are "Family"       Pets : None       Interests / Fun: "Watch TV"       Current Stressors: "None"      Religious / Personal Beliefs: Runner, broadcasting/film/video, RN,BSN         Social Determinants of Health   Financial Resource Strain: Not on  file  Food Insecurity: Not on file  Transportation Needs: Not on file  Physical Activity: Not on file  Stress: Not on file  Social Connections: Not on file  Intimate Partner Violence: Unknown (12/30/2017)   Received from [facility]   Humiliation, Afraid, Rape, and Kick questionnaire    Fear of Current or Ex-Partner: Patient declined    Emotionally Abused: Patient declined    Physically Abused: Patient declined    Sexually Abused: Patient declined    Review of Systems: ROS negative except for what is noted on the assessment and plan.  Vitals:   07/19/23 0815  BP: 138/62  Pulse: 78  Temp: 98 F (36.7 C)  TempSrc: Oral  SpO2: 94%  Weight: (!) 304 lb (137.9 kg)  Height: 6\' 1"  (1.854 m)    Physical Exam: Constitutional: well-appearing male  in no acute distress Cardiovascular: regular rate and rhythm, no m/r/g Pulmonary/Chest: normal work of breathing on room air, lungs clear to auscultation bilaterally Abdominal: soft, non-tender, non-distended, normoactive bowel sounds   Assessment & Plan:   Abdominal discomfort Patient evaluated on October 21 for mild to moderate left lower quadrant abdominal discomfort he has been having.  He was given senna, and his abdominal pain went away until last week where he started noticing his left lower quadrant pain again.  He states it mostly hurts when he is walking or  sitting down.  He has been having regular bowel movements with the senna, albeit loose.  He denies any fevers, chills, new falls.  He denies any new food trucks, or any restaurants.  He denies any nausea or vomiting.  His last bowel movement was this morning, and before that was yesterday.  On my exam, he has mild tenderness to palpation in the left lower quadrant, normal active bowel sounds, and abdomen is not distended.  Overall, given return of symptoms, will further evaluate for diverticulitis with CT abdomen and CBC. If WBC >20 will initiate  antibiotics, however reassuring that pt has not had systemic symptoms.   Patient discussed with Dr. Rockey Situ, M.D. [facility] Internal Medicine, PGY-2 Pager: [phone] Date 07/19/2023 Time 2:08 PM

## 2023-07-20 LAB — CBC
Hematocrit: 36.8 % — ABNORMAL LOW (ref 37.5–51.0)
Hemoglobin: 11.9 g/dL — ABNORMAL LOW (ref 13.0–17.7)
MCH: 31.4 pg (ref 26.6–33.0)
MCHC: 32.3 g/dL (ref 31.5–35.7)
MCV: 97 fL (ref 79–97)
Platelets: 182 10*3/uL (ref 150–450)
RBC: 3.79 x10E6/uL — ABNORMAL LOW (ref 4.14–5.80)
RDW: 13.8 % (ref 11.6–15.4)
WBC: 7.7 10*3/uL (ref 3.4–10.8)

## 2023-07-22 NOTE — Progress Notes (Signed)
Internal Medicine Clinic Attending  Case discussed with the resident at the time of the visit.  We reviewed the resident's history and exam and pertinent patient test results.  I agree with the assessment, diagnosis, and plan of care documented in the resident's note.  

## 2023-07-25 ENCOUNTER — Ambulatory Visit (HOSPITAL_COMMUNITY)
Admission: RE | Admit: 2023-07-25 | Discharge: 2023-07-25 | Disposition: A | Discharge status: Home / Self Care | Payer: 59 | Source: Ambulatory Visit | Attending: Internal Medicine | Admitting: Internal Medicine

## 2023-07-25 ENCOUNTER — Other Ambulatory Visit: Payer: Self-pay | Admitting: Student in an Organized Health Care Education/Training Program

## 2023-07-25 DIAGNOSIS — K5730 Diverticulosis of large intestine without perforation or abscess without bleeding: Secondary | ICD-10-CM | POA: Diagnosis not present

## 2023-07-25 DIAGNOSIS — R1032 Left lower quadrant pain: Secondary | ICD-10-CM | POA: Diagnosis not present

## 2023-07-25 DIAGNOSIS — R109 Unspecified abdominal pain: Secondary | ICD-10-CM | POA: Insufficient documentation

## 2023-07-25 DIAGNOSIS — N281 Cyst of kidney, acquired: Secondary | ICD-10-CM | POA: Diagnosis not present

## 2023-07-25 DIAGNOSIS — I10 Essential (primary) hypertension: Secondary | ICD-10-CM

## 2023-07-25 DIAGNOSIS — Z79891 Long term (current) use of opiate analgesic: Secondary | ICD-10-CM

## 2023-07-25 MED ORDER — OXYCODONE-ACETAMINOPHEN 10-325 MG PO TABS
1.0000 | ORAL_TABLET | Freq: Four times a day (QID) | ORAL | 0 refills | Status: DC | PRN
Start: 2023-07-25 — End: 2023-08-12

## 2023-07-25 MED ORDER — IOHEXOL 300 MG/ML  SOLN
90.0000 mL | Freq: Once | INTRAMUSCULAR | Status: AC | PRN
  Administered 2023-07-25: 90 mL via INTRAVENOUS

## 2023-07-25 MED ORDER — IOHEXOL 300 MG/ML  SOLN
30.0000 mL | Freq: Once | INTRAMUSCULAR | Status: AC | PRN
  Administered 2023-07-25: 30 mL via ORAL

## 2023-07-31 ENCOUNTER — Other Ambulatory Visit: Payer: Self-pay | Admitting: Student in an Organized Health Care Education/Training Program

## 2023-08-09 DIAGNOSIS — M21372 Foot drop, left foot: Secondary | ICD-10-CM | POA: Diagnosis not present

## 2023-08-12 ENCOUNTER — Encounter: Payer: Self-pay | Admitting: *Deleted

## 2023-08-12 ENCOUNTER — Ambulatory Visit: Payer: 59 | Admitting: Student in an Organized Health Care Education/Training Program

## 2023-08-12 ENCOUNTER — Encounter: Payer: Self-pay | Admitting: Student in an Organized Health Care Education/Training Program

## 2023-08-12 ENCOUNTER — Ambulatory Visit: Payer: 59 | Admitting: *Deleted

## 2023-08-12 VITALS — BP 127/68 | HR 53 | Temp 97.7°F | Ht 73.0 in | Wt 307.8 lb

## 2023-08-12 DIAGNOSIS — I5032 Chronic diastolic (congestive) heart failure: Secondary | ICD-10-CM

## 2023-08-12 DIAGNOSIS — E1140 Type 2 diabetes mellitus with diabetic neuropathy, unspecified: Secondary | ICD-10-CM | POA: Diagnosis not present

## 2023-08-12 DIAGNOSIS — I10 Essential (primary) hypertension: Secondary | ICD-10-CM | POA: Diagnosis not present

## 2023-08-12 DIAGNOSIS — M21372 Foot drop, left foot: Secondary | ICD-10-CM | POA: Diagnosis not present

## 2023-08-12 DIAGNOSIS — Z79891 Long term (current) use of opiate analgesic: Secondary | ICD-10-CM | POA: Diagnosis not present

## 2023-08-12 DIAGNOSIS — Z7984 Long term (current) use of oral hypoglycemic drugs: Secondary | ICD-10-CM | POA: Diagnosis not present

## 2023-08-12 DIAGNOSIS — Z0000 Encounter for general adult medical examination without abnormal findings: Secondary | ICD-10-CM

## 2023-08-12 DIAGNOSIS — N521 Erectile dysfunction due to diseases classified elsewhere: Secondary | ICD-10-CM

## 2023-08-12 LAB — POCT GLYCOSYLATED HEMOGLOBIN (HGB A1C): Hemoglobin A1C: 7 % — AB (ref 4.0–5.6)

## 2023-08-12 LAB — GLUCOSE, CAPILLARY: Glucose-Capillary: 148 mg/dL — ABNORMAL HIGH (ref 70–99)

## 2023-08-12 MED ORDER — OXYCODONE-ACETAMINOPHEN 10-325 MG PO TABS: 1.0000 | ORAL_TABLET | Freq: Four times a day (QID) | ORAL | 0 refills | Status: DC | PRN

## 2023-08-12 NOTE — Progress Notes (Signed)
Subjective:   Juan Calderon is a 66 y.o. male who presents for Medicare Annual/Subsequent preventive examination.  Visit Complete: In person  Patient Medicare AWV questionnaire was completed by the patient on 08/12/2023; I have confirmed that all information answered by patient is correct and no changes since this date.        Objective:    Today's Vitals   08/12/23 1035 08/12/23 1107  BP: (!) 148/76 127/68  Pulse: (!) 54 (!) 53  Temp: 97.7 F (36.5 C)   TempSrc: Oral   SpO2: 97%   Weight: (!) 307 lb 12.8 oz (139.6 kg)   Height: 6\' 1"  (1.854 m)    Body mass index is 40.61 kg/m.     06/24/2023    8:50 AM 05/13/2023   10:56 AM 02/04/2023   11:42 AM 11/05/2022   10:34 AM 08/06/2022   11:42 AM 07/20/2022    1:18 PM 07/05/2022    8:46 AM  Advanced Directives  Does Patient Have a Medical Advance Directive? No No No No No No   Would patient like information on creating a medical advance directive? No - Patient declined No - Patient declined No - Patient declined No - Patient declined No - Patient declined No - Patient declined No - Patient declined    Current Medications (verified) Outpatient Encounter Medications as of 08/12/2023  Medication Sig   albuterol (VENTOLIN HFA) 108 (90 Base) MCG/ACT inhaler INHALE 1-2 PUFFS INTO THE LUNGS EVERY 6 (SIX) HOURS AS NEEDED FOR SHORTNESS OF BREATH.   allopurinol (ZYLOPRIM) 300 MG tablet TAKE 1 TABLET BY MOUTH EVERY DAY   atorvastatin (LIPITOR) 20 MG tablet TAKE 1 TABLET BY MOUTH EVERY DAY   Blood Glucose Monitoring Suppl (ONETOUCH VERIO) w/Device KIT 1 each by Does not apply route daily.   Calcium Citrate-Vitamin D 315-5 MG-MCG TABS TAKE 2 TABLETS BY MOUTH EVERY DAY   [employer] VITAMIN B12 1000 MCG tablet TAKE 1 TABLET BY MOUTH EVERY DAY   ELIQUIS 5 MG TABS tablet TAKE 1 TABLET BY MOUTH TWICE A DAY   famotidine (PEPCID) 40 MG tablet TAKE 1 TABLET BY MOUTH EVERY DAY   flecainide (TAMBOCOR) 100 MG tablet TAKE 1 TABLET BY MOUTH TWICE A DAY    glucose blood (ONETOUCH VERIO) test strip Use as instructed   latanoprost (XALATAN) 0.005 % ophthalmic solution Place 1 drop into both eyes at bedtime.   losartan (COZAAR) 50 MG tablet TAKE 1 TABLET BY MOUTH EVERY DAY   magnesium oxide (MAG-OX) 400 (240 Mg) MG tablet Take 2 tablets (800 mg total) by mouth daily.   metFORMIN (GLUCOPHAGE) 1000 MG tablet TAKE 1 TABLET (1,000 MG TOTAL) BY MOUTH TWICE A DAY WITH FOOD   metoprolol tartrate (LOPRESSOR) 25 MG tablet TAKE 1 TABLET BY MOUTH TWICE A DAY   nitroGLYCERIN (NITROSTAT) 0.4 MG SL tablet Place 1 tablet (0.4 mg total) under the tongue every 5 (five) minutes as needed for chest pain.   ONETOUCH DELICA LANCETS 33G MISC Use 1 strip daily   oxyCODONE-acetaminophen (PERCOCET) 10-325 MG tablet Take 1 tablet by mouth every 6 (six) hours as needed for pain.   senna (SENOKOT) 8.6 MG TABS tablet Take 2 tablets (17.2 mg total) by mouth daily.   tadalafil (CIALIS) 10 MG tablet Take 1 tablet (10 mg total) by mouth as needed for erectile dysfunction.   terazosin (HYTRIN) 5 MG capsule TAKE 1 CAPSULE BY MOUTH AT BEDTIME.   torsemide (DEMADEX) 20 MG tablet TAKE 1 TABLET BY MOUTH  EVERY DAY   Wound Dressings (SORBSAN WOUND DRESSING) PADS Apply 10 each topically daily.   No facility-administered encounter medications on file as of 08/12/2023.    Allergies (verified) Ramipril, Tape, and Testosterone   History: Past Medical History:  Diagnosis Date   A-fib (HCC)    Alcohol abuse     Ascending aorta dilation (HCC) 02/04/2023   TTE 10/2021: 42 mm  TTE 03/06/2023: EF 60-65, no RWMA, moderate LVH, normal RVSF, small pericardial effusion, RAP 3, normal aorta size (root 37 mm, ascending aorta 39 mm)   Asthma    Bradycardia    C6 radiculopathy 01/24/2016   Right upper extremity, mild to moderate electrically by EMG on 01/24/2016   Cataract    Left eye   Chronic diastolic heart failure (HCC)     with mild left ventricular hypertrophy on Echo 02/2010   Chronic kidney  disease 02/28/2015   Chronic obstructive pulmonary disease (HCC)     Chronic osteomyelitis of femur (HCC) 04/06/2016   Chronic osteomyelitis of left femur (HCC) 11/22/2017   Left femur s/p prior trauma   Chronic osteomyelitis of left femur (HCC) 11/22/2017   Brodie's abscess: left femur s/p prior trauma.  Underwent partial excision and curettage of left femoral osteomyelitis at [facility] 12/30/2017 with grossly purulent material encountered within the medullary canal of the left distal femur.  Cultures grew MSSA.  Post-operatively received 6 weeks of IV antibiotics through 02/10/2018.  CRP elevated at 60.3 at end of IV antibiotic course so continued on Keflex   Chronic pain syndrome     Left arm and leg s/p traumatic injury    Chronic renal insufficiency     Coronary artery disease     25% LAD stenosis on cath 2007.  Stable angina.   Diverticulosis     Diverticulosis 11/12/2013   Essential hypertension     Frequent PVCs    Gastroesophageal reflux disease     Gout     Hyperlipidemia LDL goal < 100     Internal hemorrhoids without complication 08/12/2018   Long-term current use of opiate analgesic 09/07/2016   Mild carpal tunnel syndrome of right wrist 01/24/2016   Mild degree electrically per EMG 01/24/2016    Mild carpal tunnel syndrome of right wrist 01/24/2016   Mild degree electrically per EMG 01/24/2016    Morbid obesity with BMI of 40.0-44.9, adult (HCC)     Normocytic anemia     NSVT (nonsustained ventricular tachycardia) (HCC)    Obstructive sleep apnea     Moderate, AHI 29.8 per hour with moderately loud snoring and oxygen desaturation to a nadir of 79%. CPAP titration resulted in a prescription for 17 CWP.     Open-angle glaucoma     Osteoarthritis cervical spine     Osteoarthritis of left knee 06/19/2013   Tricompartmental disease.  Treated with double hinged upright knee brace, steroid/xylocaine knee injections, and NSAIDs    Osteoporosis 05/14/2017   s/p fracture of the right  humerus from a fall at ground hight   Persistent atrial fibrillation (HCC)    Right rotator cuff tear     Large full-thickness tear of the supraspinatus with mild retraction but no atrophy    Right rotator cuff tear 04/25/2013   Large full-thickness tear of the supraspinatus with mild retraction but no atrophy     Secondary male hypogonadism 02/07/2017   Likely secondary to chronic opioid use   Secondary male hypogonadism 02/07/2017   Likely secondary to chronic opioid use  Sleep apnea    Subclinical hypothyroidism     Tubular adenoma of colon 11/22/2017   Specifics unknown.  Repeat colonoscopy 08/12/2018 with six 3-6 mm tubular adenomas removed endoscopically.   Type II diabetes mellitus with neuropathy causing erectile dysfunction (HCC)     Vasomotor rhinitis 04/25/2013   Past Surgical History:  Procedure Laterality Date   A-FLUTTER ABLATION N/A 03/24/2019   Procedure: A-FLUTTER ABLATION;  Surgeon: Marinus Maw, MD;  Location: [facility]IVE [facility];  Service: Cardiovascular;  Laterality: N/A;   CARDIOVERSION N/A 12/30/2014   Procedure: CARDIOVERSION;  Surgeon: Chrystie Nose, MD;  Location: [facility] ENDOSCOPY;  Service: Cardiovascular;  Laterality: N/A;   FRACTURE SURGERY Left 1980's   Elbow   Left arm surgery     Left leg surgery     SHOULDER ARTHROSCOPY WITH DISTAL CLAVICLE RESECTION Left 07/05/2022   Procedure: SHOULDER ARTHROSCOPY WITH DISTAL CLAVICLE EXCISION;  Surgeon: Bjorn Pippin, MD;  Location: [facility];  Service: Orthopedics;  Laterality: Left;   SHOULDER ARTHROSCOPY WITH SUBACROMIAL DECOMPRESSION, ROTATOR CUFF REPAIR AND BICEP TENDON REPAIR Left 07/05/2022   Procedure: SHOULDER ARTHROSCOPY WITH SUBACROMIAL DECOMPRESSION, ROTATOR CUFF REPAIR AND BICEP TENDON TENOTOMY;  Surgeon: Bjorn Pippin, MD;  Location: [facility];  Service: Orthopedics;  Laterality: Left;   SHOULDER SURGERY     Right   Family History  Problem Relation Age of Onset   Heart  failure Mother    Asthma Mother    Alzheimer's disease Father    Hypertension Sister    Hypertension Sister    Hypertension Sister    Cancer Brother    Cancer Brother        unknown   Early death Brother        Gun Shot Wound   Cancer Brother        prostate   Prostate cancer Brother    Arthritis Son    Heart attack Neg Hx    Stroke Neg Hx    Colon cancer Neg Hx    Esophageal cancer Neg Hx    Pancreatic cancer Neg Hx    Stomach cancer Neg Hx    Liver disease Neg Hx    Rectal cancer Neg Hx    Social History   Socioeconomic History   Marital status: Widowed    Spouse name: Not on file   Number of children: Not on file   Years of education: Not on file   Highest education level: Not on file  Occupational History   Occupation: Disabled  Tobacco Use   Smoking status: Never   Smokeless tobacco: Never  Vaping Use   Vaping status: Never Used  Substance and Sexual Activity   Alcohol use: Yes    Alcohol/week: 14.0 standard drinks of alcohol    Types: 14 Cans of beer per week   Drug use: No   Sexual activity: Not Currently  Other Topics Concern   Not on file  Social History Narrative   Current Social History 12/14/2020      Patient lives with a friend in a home which is 1 story. There are 4 steps with handrails up to the entrance the patient uses.       Patient's method of transportation is personal car.      The highest level of education was high school diploma.      The patient currently disabled.      Identified important Relationships are "Family"  Pets : None       Interests / Fun: "Watch TV"       Current Stressors: "None"      Religious / Personal Beliefs: Vilma Meckel, RN,BSN         Social Determinants of Health   Financial Resource Strain: Medium Risk (08/12/2023)   Overall Financial Resource Strain (CARDIA)    Difficulty of Paying Living Expenses: Somewhat hard  Food Insecurity: Food Insecurity Present (08/12/2023)   Hunger  Vital Sign    Worried About Running Out of Food in the Last Year: Never true    Ran Out of Food in the Last Year: Sometimes true  Transportation Needs: No Transportation Needs (08/12/2023)   PRAPARE - Administrator, Civil Service (Medical): No    Lack of Transportation (Non-Medical): No  Physical Activity: Not on file  Stress: Not on file  Social Connections: Socially Isolated (08/12/2023)   Social Connection and Isolation Panel [NHANES]    Frequency of Communication with Friends and Family: Once a week    Frequency of Social Gatherings with Friends and Family: Once a week    Attends Religious Services: Never    Database administrator or Organizations: No    Attends Banker Meetings: Never    Marital Status: Widowed    Tobacco Counseling Counseling given: Not Answered   Clinical Intake:  Pre-visit preparation completed: Yes  Pain : Faces Faces Pain Scale: No hurt  Faces Pain Scale: No hurt  Diabetes: Yes            Activities of Daily Living    06/24/2023    8:50 AM 05/13/2023   10:56 AM  In your present state of health, do you have any difficulty performing the following activities:  Hearing? 0 0  Vision? 0 0  Difficulty concentrating or making decisions? 0 0  Walking or climbing stairs? 0 0  Dressing or bathing? 0 0  Doing errands, shopping? 0 0    Patient Care Team: Tyson Alias, MD as PCP - General (Internal Medicine) Kathleene Hazel, MD as PCP - Cardiology (Cardiology) Duke Salvia, MD as PCP - Electrophysiology (Cardiology) Ernesto Rutherford, MD as Consulting Physician (Ophthalmology) Hilarie Fredrickson, MD as Consulting Physician (Gastroenterology) Loreta Ave, MD (Inactive) as Consulting Physician (Orthopedic Surgery)  Indicate any recent Medical Services you may have received from other than [facility] providers in the past year (date may be approximate).     Assessment:   This is a routine wellness  examination for [facility].  Hearing/Vision screen No results found.   Goals Addressed   None   Depression Screen    08/12/2023    4:32 PM 06/24/2023    9:26 AM 02/04/2023   11:39 AM 11/05/2022   10:35 AM 08/06/2022   11:41 AM 04/23/2022   12:02 PM 01/22/2022   11:18 AM  PHQ 2/9 Scores  PHQ - 2 Score 0 0 0 0 0 0 0  PHQ- 9 Score      0     Fall Risk    06/24/2023    8:49 AM 05/13/2023   10:55 AM 02/04/2023   11:39 AM 11/05/2022   10:35 AM 08/06/2022   11:40 AM  Fall Risk   Falls in the past year? 0 0 0 0 0  Number falls in past yr: 0 0 0 0 0  Injury with Fall? 0 0 0 0 0  Follow  up Falls evaluation completed Falls evaluation completed Falls evaluation completed Falls evaluation completed Falls evaluation completed    MEDICARE RISK AT HOME: Medicare Risk at Home Any stairs in or around the home?: No If so, are there any without handrails?: No Home free of loose throw rugs in walkways, pet beds, electrical cords, etc?: No Adequate lighting in your home to reduce risk of falls?: No Life alert?: No Use of a cane, walker or w/c?: Yes Grab bars in the bathroom?: No Shower chair or bench in shower?: Yes Elevated toilet seat or a handicapped toilet?: Yes  TIMED UP AND GO:  Was the test performed?  No    Cognitive Function:        08/12/2023    4:22 PM  6CIT Screen  What Year? 0 points  What month? 0 points  What time? 0 points  Count back from 20 0 points  Months in reverse 0 points  Repeat phrase 0 points  Total Score 0 points    Immunizations Immunization History  Administered Date(s) Administered   Fluad Quad(high Dose 65+) 08/06/2022   Fluad Trivalent(High Dose 65+) 05/13/2023   Influenza,inj,Quad PF,6+ Mos 06/19/2013, 05/13/2014, 05/24/2016, 05/24/2017, 06/13/2018, 06/29/2019, 06/06/2020, 07/24/2021   PFIZER(Purple Top)SARS-COV-2 Vaccination 10/17/2019, 11/11/2019, 06/25/2020   PNEUMOCOCCAL CONJUGATE-20 08/06/2022   Pfizer(Comirnaty)Fall Seasonal Vaccine 12 years  and older 06/23/2022   Pneumococcal Conjugate-13 07/25/2020   Pneumococcal Polysaccharide-23 08/07/2013   Tdap 06/19/2013   Zoster Recombinant(Shingrix) 06/25/2020    TDAP status: Due, Education has been provided regarding the importance of this vaccine. Advised may receive this vaccine at local pharmacy or Health Dept. Aware to provide a copy of the vaccination record if obtained from local pharmacy or Health Dept. Verbalized acceptance and understanding.  Flu Vaccine status: Up to date  Pneumococcal vaccine status: Declined,  Education has been provided regarding the importance of this vaccine but patient still declined. Advised may receive this vaccine at local pharmacy or Health Dept. Aware to provide a copy of the vaccination record if obtained from local pharmacy or Health Dept. Verbalized acceptance and understanding.   Covid-19 vaccine status: Information provided on how to obtain vaccines.   Qualifies for Shingles Vaccine? Yes   Zostavax completed Yes   06/25/2020 Shingrix Completed?: No.    Education has been provided regarding the importance of this vaccine. Patient has been advised to call insurance company to determine out of pocket expense if they have not yet received this vaccine. Advised may also receive vaccine at local pharmacy or Health Dept. Verbalized acceptance and understanding.  Screening Tests Health Maintenance  Topic Date Due   Zoster Vaccines- Shingrix (2 of 2) 08/20/2020   COVID-19 Vaccine (5 - 2023-24 season) 05/05/2023   DTaP/Tdap/Td (2 - Td or Tdap) 06/20/2023   OPHTHALMOLOGY EXAM  10/31/2023   Diabetic kidney evaluation - Urine ACR  11/05/2023   LIPID PANEL  11/05/2023   HEMOGLOBIN A1C  11/10/2023   Diabetic kidney evaluation - eGFR measurement  06/23/2024   FOOT EXAM  08/11/2024   Medicare Annual Wellness (AWV)  08/11/2024   Colonoscopy  12/02/2026   Pneumonia Vaccine 58+ Years old  Completed   INFLUENZA VACCINE  Completed   Hepatitis C Screening   Completed   HPV VACCINES  Aged Out    Health Maintenance  Health Maintenance Due  Topic Date Due   Zoster Vaccines- Shingrix (2 of 2) 08/20/2020   COVID-19 Vaccine (5 - 2023-24 season) 05/05/2023   DTaP/Tdap/Td (2 - Td or Tdap)  06/20/2023    Colorectal cancer screening: Type of screening: Colonoscopy. Completed 12/01/2021. Repeat every 5 years  Lung Cancer Screening: (Low Dose CT Chest recommended if Age 65-80 years, 20 pack-year currently smoking OR have quit w/in 15years.) does not qualify.   Lung Cancer Screening Referral: n/a  Additional Screening:  Hepatitis C Screening: does not qualify; Completed 03/09/2015  Vision Screening: Recommended annual ophthalmology exams for early detection of glaucoma and other disorders of the eye. Is the patient up to date with their annual eye exam?  Yes  Who is the provider or what is the name of the office in which the patient attends annual eye exams? [facility] If pt is not established with a provider, would they like to be referred to a provider to establish care?  N/a .   Dental Screening: Recommended annual dental exams for proper oral hygiene  Diabetic Foot Exam: Diabetic Foot Exam: Completed 08/12/2023  Community Resource Referral / Chronic Care Management: CRR required this visit?  No   CCM required this visit?  No     Plan:     I have personally reviewed and noted the following in the patient's chart:   Medical and social history Use of alcohol, tobacco or illicit drugs  Current medications and supplements including opioid prescriptions. Patient is currently taking opioid prescriptions. Information provided to patient regarding non-opioid alternatives. Patient advised to discuss non-opioid treatment plan with their provider. Functional ability and status Nutritional status Physical activity Advanced directives List of other physicians Hospitalizations, surgeries, and ER visits in previous 12 months Vitals Screenings  to include cognitive, depression, and falls Referrals and appointments  In addition, I have reviewed and discussed with patient certain preventive protocols, quality metrics, and best practice recommendations. A written personalized care plan for preventive services as well as general preventive health recommendations were provided to patient.     Joslyn Devon, [place]   08/12/2023   After Visit Summary: (In Person-Printed) AVS printed and given to the patient  Nurse Notes: face to face  Mr. Touch , Thank you for taking time to come for your Medicare Wellness Visit. I appreciate your ongoing commitment to your health goals. Please review the following plan we discussed and let me know if I can assist you in the future.   These are the goals we discussed:  Goals      Blood Pressure < 130/80     Exercise 2-3x per week (5 min per time)     Seated and standing exercises with exercise band     HEMOGLOBIN A1C < 7.0     Weight < 294 lb (133.4 kg)     5% weight loss        This is a list of the screening recommended for you and due dates:  Health Maintenance  Topic Date Due   Zoster (Shingles) Vaccine (2 of 2) 08/20/2020   COVID-19 Vaccine (5 - 2023-24 season) 05/05/2023   DTaP/Tdap/Td vaccine (2 - Td or Tdap) 06/20/2023   Eye exam for diabetics  10/31/2023   Yearly kidney health urinalysis for diabetes  11/05/2023   Lipid (cholesterol) test  11/05/2023   Hemoglobin A1C  11/10/2023   Yearly kidney function blood test for diabetes  06/23/2024   Complete foot exam   08/11/2024   Medicare Annual Wellness Visit  08/11/2024   Colon Cancer Screening  12/02/2026   Pneumonia Vaccine  Completed   Flu Shot  Completed   Hepatitis C Screening  Completed  HPV Vaccine  Aged Out

## 2023-08-12 NOTE — Assessment & Plan Note (Signed)
Doing well with chronic use of oxycodone 10 mg 3-4 times daily.  He uses #100 for 1 month supply.  Reports good adherence, reports good benefit from the medication.  Denies any side effects.  No constipation.  No recent falls.  Does have some issues with disequilibrium, likely related to foot drop which is being managed with a brace.  I reviewed the database which was appropriate.  Last tox assure in March was appropriate.  Will continue with this medication, I sent 3 months of refills to his pharmacy.

## 2023-08-12 NOTE — Assessment & Plan Note (Signed)
Well-controlled, A1c 7.0% today.  Foot exam normal today.  Will plan to continue metformin 1000 mg twice daily.  No complications of diabetes noted.  Continue losartan for renal protection and atorvastatin for primary prevention of ischemic vascular disease.

## 2023-08-12 NOTE — Assessment & Plan Note (Signed)
Managed by orthopedics at [facility].  Has a brace now which is doing a good job keeping his left foot in a more safe position while he is walking.

## 2023-08-12 NOTE — Progress Notes (Signed)
Established Patient Office Visit  Subjective   Patient ID: Juan Calderon, male    DOB: 1955-09-18  Age: 67 y.o. MRN: [id number]  Chief Complaint  Patient presents with   Diabetes   Medication Refill    HPI  67 year old person here for management of diabetes and hypertension.  Doing very well since I last saw him.  Reports safely being environment.  Good adherence with medications, denies any side effects.  No recent falls.  Still has some feeling of disequilibrium.  Independent in activities of daily living.  Wears CPAP at night most nights, says that he needs to go get it fixed right now, has been working for a few nights.  No chest pain or dyspnea with exertion.  Reports good adherence with pain medication, no side effects, reports good benefit in improving his functional abilities.    Objective:     BP 127/68 (BP Location: Left Arm, Patient Position: Sitting, Cuff Size: Small)   Pulse (!) 53   Temp 97.7 F (36.5 C) (Oral)   Ht 6\' 1"  (1.854 m)   Wt (!) 307 lb 12.8 oz (139.6 kg)   SpO2 97%   BMI 40.61 kg/m    Physical Exam  General: Well-appearing man, no distress CV: Regular rate and rhythm, no murmurs Lungs: Unlabored, clear to auscultation bilaterally Extremities: Warm well-perfused, 1+ nonpitting edema in both legs which is chronic Neuro: Alert, conversational, full strength upper and lower extremities, delayed get up and go test, wide-based gait with some instability Psych: Appropriate mood and affect, not depressed or anxious appearing    Assessment & Plan:   Problem List Items Addressed This Visit       High   Type II diabetes mellitus with neuropathy causing erectile dysfunction (HCC) - Primary (Chronic)    Well-controlled, A1c 7.0% today.  Foot exam normal today.  Will plan to continue metformin 1000 mg twice daily.  No complications of diabetes noted.  Continue losartan for renal protection and atorvastatin for primary prevention of ischemic vascular  disease.      Relevant Orders   POC Hbg A1C (Completed)   Chronic use of opiate drug for therapeutic purpose (Chronic)    Doing well with chronic use of oxycodone 10 mg 3-4 times daily.  He uses #100 for 1 month supply.  Reports good adherence, reports good benefit from the medication.  Denies any side effects.  No constipation.  No recent falls.  Does have some issues with disequilibrium, likely related to foot drop which is being managed with a brace.  I reviewed the database which was appropriate.  Last tox assure in March was appropriate.  Will continue with this medication, I sent 3 months of refills to his pharmacy.      Relevant Medications   oxyCODONE-acetaminophen (PERCOCET) 10-325 MG tablet     Medium    Essential hypertension (Chronic)    Blood pressure well-controlled today.  Plan to continue with losartan 50 mg daily.  BMP was normal and October.  Follow-up for this in 3-6 months.      (HFpEF) heart failure with preserved ejection fraction (HCC) (Chronic)    Comanaged with cardiology.  Chronic HFpEF well compensated, appears euvolemic on exam today.  No recent exacerbations or hospitalizations.  He has NYHA class II-III symptoms, but likely a large component if this is due to obesity.  Will continue treatment with losartan 50 mg daily and metoprolol 25 mg twice daily.  Has some asymptomatic bradycardia, need to  continue the beta-blocker because he is also using flecainide.        Unprioritized   Left foot drop    Managed by orthopedics at [facility].  Has a brace now which is doing a good job keeping his left foot in a more safe position while he is walking.       No follow-ups on file.    Tyson Alias, MD

## 2023-08-12 NOTE — Patient Instructions (Signed)
Thank you, [name] for allowing [place] to provide your care today.  It sounds like you're doing very well at the moment. No medication changes today and keep up the good work!    Follow up: 3 months for an A1c check    Thank you for trusting me with your care. Wishing you the best!   Thea Alken, MS3

## 2023-08-12 NOTE — Assessment & Plan Note (Signed)
Blood pressure well-controlled today.  Plan to continue with losartan 50 mg daily.  BMP was normal and October.  Follow-up for this in 3-6 months.

## 2023-08-12 NOTE — Patient Instructions (Signed)
Health Maintenance, Male Adopting a healthy lifestyle and getting preventive care are important in promoting health and wellness. Ask your health care provider about: The right schedule for you to have regular tests and exams. Things you can do on your own to prevent diseases and keep yourself healthy. What should I know about diet, weight, and exercise? Eat a healthy diet  Eat a diet that includes plenty of vegetables, fruits, low-fat dairy products, and lean protein. Do not eat a lot of foods that are high in solid fats, added sugars, or sodium. Maintain a healthy weight Body mass index (BMI) is a measurement that can be used to identify possible weight problems. It estimates body fat based on height and weight. Your health care provider can help determine your BMI and help you achieve or maintain a healthy weight. Get regular exercise Get regular exercise. This is one of the most important things you can do for your health. Most adults should: Exercise for at least 150 minutes each week. The exercise should increase your heart rate and make you sweat (moderate-intensity exercise). Do strengthening exercises at least twice a week. This is in addition to the moderate-intensity exercise. Spend less time sitting. Even light physical activity can be beneficial. Watch cholesterol and blood lipids Have your blood tested for lipids and cholesterol at 69 years of age, then have this test every 5 years. You may need to have your cholesterol levels checked more often if: Your lipid or cholesterol levels are high. You are older than 69 years of age. You are at high risk for heart disease. What should I know about cancer screening? Many types of cancers can be detected early and may often be prevented. Depending on your health history and family history, you may need to have cancer screening at various ages. This may include screening for: Colorectal cancer. Prostate cancer. Skin cancer. Lung  cancer. What should I know about heart disease, diabetes, and high blood pressure? Blood pressure and heart disease High blood pressure causes heart disease and increases the risk of stroke. This is more likely to develop in people who have high blood pressure readings or are overweight. Talk with your health care provider about your target blood pressure readings. Have your blood pressure checked: Every 3-5 years if you are 18-39 years of age. Every year if you are 40 years old or older. If you are between the ages of 65 and 75 and are a current or former smoker, ask your health care provider if you should have a one-time screening for abdominal aortic aneurysm (AAA). Diabetes Have regular diabetes screenings. This checks your fasting blood sugar level. Have the screening done: Once every three years after age 45 if you are at a normal weight and have a low risk for diabetes. More often and at a younger age if you are overweight or have a high risk for diabetes. What should I know about preventing infection? Hepatitis B If you have a higher risk for hepatitis B, you should be screened for this virus. Talk with your health care provider to find out if you are at risk for hepatitis B infection. Hepatitis C Blood testing is recommended for: Everyone born from 1945 through 1965. Anyone with known risk factors for hepatitis C. Sexually transmitted infections (STIs) You should be screened each year for STIs, including gonorrhea and chlamydia, if: You are sexually active and are younger than 69 years of age. You are older than 69 years of age and your   health care provider tells you that you are at risk for this type of infection. Your sexual activity has changed since you were last screened, and you are at increased risk for chlamydia or gonorrhea. Ask your health care provider if you are at risk. Ask your health care provider about whether you are at high risk for HIV. Your health care provider  may recommend a prescription medicine to help prevent HIV infection. If you choose to take medicine to prevent HIV, you should first get tested for HIV. You should then be tested every 3 months for as long as you are taking the medicine. Follow these instructions at home: Alcohol use Do not drink alcohol if your health care provider tells you not to drink. If you drink alcohol: Limit how much you have to 0-2 drinks a day. Know how much alcohol is in your drink. In the [place] one drink equals one 12 oz bottle of beer (355 mL), one 5 oz glass of wine (148 mL), or one 1 oz glass of hard liquor (44 mL). Lifestyle Do not use any products that contain nicotine or tobacco. These products include cigarettes, chewing tobacco, and vaping devices, such as e-cigarettes. If you need help quitting, ask your health care provider. Do not use street drugs. Do not share needles. Ask your health care provider for help if you need support or information about quitting drugs. General instructions Schedule regular health, dental, and eye exams. Stay current with your vaccines. Tell your health care provider if: You often feel depressed. You have ever been abused or do not feel safe at home. Summary Adopting a healthy lifestyle and getting preventive care are important in promoting health and wellness. Follow your health care provider's instructions about healthy diet, exercising, and getting tested or screened for diseases. Follow your health care provider's instructions on monitoring your cholesterol and blood pressure. This information is not intended to replace advice given to you by your health care provider. Make sure you discuss any questions you have with your health care provider. Document Revised: 01/09/2021 Document Reviewed: 01/09/2021 Elsevier Patient Education  2024 Elsevier Inc.  

## 2023-08-12 NOTE — Assessment & Plan Note (Signed)
Comanaged with cardiology.  Chronic HFpEF well compensated, appears euvolemic on exam today.  No recent exacerbations or hospitalizations.  He has NYHA class II-III symptoms, but likely a large component if this is due to obesity.  Will continue treatment with losartan 50 mg daily and metoprolol 25 mg twice daily.  Has some asymptomatic bradycardia, need to continue the beta-blocker because he is also using flecainide.

## 2023-09-24 DIAGNOSIS — G4733 Obstructive sleep apnea (adult) (pediatric): Secondary | ICD-10-CM | POA: Diagnosis not present

## 2023-09-30 ENCOUNTER — Other Ambulatory Visit: Payer: Self-pay

## 2023-09-30 DIAGNOSIS — Z79891 Long term (current) use of opiate analgesic: Secondary | ICD-10-CM

## 2023-10-02 ENCOUNTER — Telehealth: Payer: Self-pay | Admitting: Cardiovascular Disease

## 2023-10-02 MED ORDER — OXYCODONE-ACETAMINOPHEN 10-325 MG PO TABS: 1.0000 | ORAL_TABLET | Freq: Four times a day (QID) | ORAL | 0 refills | Status: DC | PRN

## 2023-10-02 NOTE — Telephone Encounter (Signed)
Pt advised of Scott's recommendation. He will take extra Torsemide tomorrow. Aware office will call to get him scheduled per Scott's recommendation.

## 2023-10-02 NOTE — Telephone Encounter (Addendum)
Pt reports pedal edema, left greater than right.  He is dizzy when he stands up, for about the past month. "Little CP, like a dull tightness, not actual pain".  7 out of 10, for past several weeks.  Constant pain.  Denies any strenuous activity/muscle strain. Says he is SOB. Denies wt gain or afib. Aware forwarding to provider for guidance..... Lorin Picket how soon do you think pt needs to be seen vs have them see PCP first?? (He says he is getting a new PCP and can't see someone until March) Pt aware will let him know once provider advises, pt agreeable to plan.

## 2023-10-02 NOTE — Telephone Encounter (Signed)
Would get him in this week with anyone that can see him. Have him take extra Torsemide 20 mg today. If symptoms worsen, he should go to the ED. Tereso Newcomer, PA-C    10/02/2023 2:39 PM

## 2023-10-02 NOTE — Telephone Encounter (Signed)
Pt c/o swelling/edema: STAT if pt has developed SOB within 24 hours  If swelling, where is the swelling located? Mostly Left foot but right is slightly swollen  How much weight have you gained and in what time span? no  Have you gained 2 pounds in a day or 5 pounds in a week? Doesn't know  Do you have a log of your daily weights (if so, list)? No   Are you currently taking a fluid pill? yes  Are you currently SOB? Yes  Have you traveled recently in a car or plane for an extended period of time? no
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
## 2023-10-04 ENCOUNTER — Ambulatory Visit (INDEPENDENT_AMBULATORY_CARE_PROVIDER_SITE_OTHER): Payer: 59

## 2023-10-04 ENCOUNTER — Encounter: Payer: Self-pay | Admitting: Physician Assistant

## 2023-10-04 ENCOUNTER — Ambulatory Visit: Payer: 59 | Attending: Physician Assistant | Admitting: Physician Assistant

## 2023-10-04 VITALS — BP 98/72 | HR 56 | Ht 73.0 in | Wt 303.8 lb

## 2023-10-04 DIAGNOSIS — R55 Syncope and collapse: Secondary | ICD-10-CM

## 2023-10-04 DIAGNOSIS — I77810 Thoracic aortic ectasia: Secondary | ICD-10-CM | POA: Diagnosis not present

## 2023-10-04 DIAGNOSIS — I480 Paroxysmal atrial fibrillation: Secondary | ICD-10-CM | POA: Diagnosis not present

## 2023-10-04 DIAGNOSIS — I2510 Atherosclerotic heart disease of native coronary artery without angina pectoris: Secondary | ICD-10-CM

## 2023-10-04 DIAGNOSIS — R0609 Other forms of dyspnea: Secondary | ICD-10-CM

## 2023-10-04 DIAGNOSIS — I5032 Chronic diastolic (congestive) heart failure: Secondary | ICD-10-CM

## 2023-10-04 DIAGNOSIS — I25119 Atherosclerotic heart disease of native coronary artery with unspecified angina pectoris: Secondary | ICD-10-CM

## 2023-10-04 DIAGNOSIS — I10 Essential (primary) hypertension: Secondary | ICD-10-CM

## 2023-10-04 DIAGNOSIS — R001 Bradycardia, unspecified: Secondary | ICD-10-CM | POA: Diagnosis not present

## 2023-10-04 MED ORDER — LOSARTAN POTASSIUM 25 MG PO TABS: 25.0000 mg | ORAL_TABLET | Freq: Every day | ORAL | 3 refills | Status: DC

## 2023-10-04 MED ORDER — NITROGLYCERIN 0.4 MG SL SUBL: 0.4000 mg | SUBLINGUAL_TABLET | SUBLINGUAL | 1 refills | Status: AC | PRN

## 2023-10-04 NOTE — Patient Instructions (Signed)
Medication Instructions:   DISCONTINUE Metoprolol.   DECREASE Losartan one (1) tablet by mouth ( 25 mg) daily. You can cut your ( 50 mg) tablets in half and use them up.   *If you need a refill on your cardiac medications before your next appointment, please call your pharmacy*   Lab Work:  None ordered.  If you have labs (blood work) drawn today and your tests are completely normal, you will receive your results only by: MyChart Message (if you have MyChart) OR A paper copy in the mail If you have any lab test that is abnormal or we need to change your treatment, we will call you to review the results.   Testing/Procedures:  Christena Deem- Long Term Monitor Instructions  Your physician has requested you wear a ZIO patch monitor for 14 days.  This is a single patch monitor. [employer] supplies one patch monitor per enrollment. Additional stickers are not available. Please do not apply patch if you will be having a Nuclear Stress Test,  Echocardiogram, Cardiac CT, MRI, or Chest Xray during the period you would be wearing the  monitor. The patch cannot be worn during these tests. You cannot remove and re-apply the  ZIO XT patch monitor.  Your ZIO patch monitor will be mailed 3 day USPS to your address on file. It may take 3-5 days  to receive your monitor after you have been enrolled.  Once you have received your monitor, please review the enclosed instructions. Your monitor  has already been registered assigning a specific monitor serial # to you.  Billing and Patient Assistance Program Information  We have supplied [employer] with any of your insurance information on file for billing purposes. [employer] offers a sliding scale Patient Assistance Program for patients that do not have  insurance, or whose insurance does not completely cover the cost of the ZIO monitor.  You must apply for the Patient Assistance Program to qualify for this discounted rate.  To apply, please call [employer] at  [phone], select option 4, select option 2, ask to apply for  Patient Assistance Program. Meredeth Ide will ask your household income, and how many people  are in your household. They will quote your out-of-pocket cost based on that information.  [employer] will also be able to set up a 79-month, interest-free payment plan if needed.  Applying the monitor   Shave hair from upper left chest.  Hold abrader disc by orange tab. Rub abrader in 40 strokes over the upper left chest as  indicated in your monitor instructions.  Clean area with 4 enclosed alcohol pads. Let dry.  Apply patch as indicated in monitor instructions. Patch will be placed under collarbone on left  side of chest with arrow pointing upward.  Rub patch adhesive wings for 2 minutes. Remove white label marked "1". Remove the white  label marked "2". Rub patch adhesive wings for 2 additional minutes.  While looking in a mirror, press and release button in center of patch. A small green light will  flash 3-4 times. This will be your only indicator that the monitor has been turned on.  Do not shower for the first 24 hours. You may shower after the first 24 hours.  Press the button if you feel a symptom. You will hear a small click. Record Date, Time and  Symptom in the Patient Logbook.  When you are ready to remove the patch, follow instructions on the last 2 pages of Patient  Logbook. Stick patch monitor  onto the last page of Patient Logbook.  Place Patient Logbook in the blue and white box. Use locking tab on box and tape box closed  securely. The blue and white box has prepaid postage on it. Please place it in the mailbox as  soon as possible. Your physician should have your test results approximately 7 days after the  monitor has been mailed back to [facility].  Call [facility] Customer Care at [phone] if you have questions regarding  your ZIO XT patch monitor. Call them immediately if you see an orange light  blinking on your  monitor.  If your monitor falls off in less than 4 days, contact our Monitor department at [phone].  If your monitor becomes loose or falls off after 4 days call [employer] at [phone] for  suggestions on securing your monitor    Follow-Up: At [facility], you and your health needs are our priority.  As part of our continuing mission to provide you with exceptional heart care, we have created designated Provider Care Teams.  These Care Teams include your primary Cardiologist (physician) and Advanced Practice Providers (APPs -  Physician Assistants and Nurse Practitioners) who all work together to provide you with the care you need, when you need it.  We recommend signing up for the patient portal called "MyChart".  Sign up information is provided on this After Visit Summary.  MyChart is used to connect with patients for Virtual Visits (Telemedicine).  Patients are able to view lab/test results, encounter notes, upcoming appointments, etc.  Non-urgent messages can be sent to your provider as well.   To learn more about what you can do with MyChart, go to [email or web address].    Your next appointment:   4 week(s)  Provider:   Jari Favre, PA-C         Other Instructions         1st Floor: - Lobby - Registration  - Pharmacy  - Lab - Cafe  2nd Floor: - PV Lab - Diagnostic Testing (echo, CT, nuclear med)  3rd Floor: - Vacant  4th Floor: - TCTS (cardiothoracic surgery) - AFib Clinic - Structural Heart Clinic - Vascular Surgery  - Vascular Ultrasound  5th Floor: - HeartCare Cardiology (general and EP) - Clinical Pharmacy for coumadin, hypertension, lipid, weight-loss medications, and med management appointments    Valet parking services will be available as well.

## 2023-10-04 NOTE — Progress Notes (Unsigned)
Applied a 7 day Zio XT monitor to patient in the office  [place] to read

## 2023-10-05 ENCOUNTER — Other Ambulatory Visit: Payer: Self-pay | Admitting: Student in an Organized Health Care Education/Training Program

## 2023-10-05 DIAGNOSIS — I10 Essential (primary) hypertension: Secondary | ICD-10-CM

## 2023-10-15 ENCOUNTER — Ambulatory Visit: Payer: 59 | Admitting: Student

## 2023-10-15 VITALS — BP 152/79 | HR 76 | Ht 73.0 in | Wt 308.4 lb

## 2023-10-15 DIAGNOSIS — I872 Venous insufficiency (chronic) (peripheral): Secondary | ICD-10-CM
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
## 2023-10-15 NOTE — Patient Instructions (Addendum)
  Thank you, Juan Calderon, for allowing [place] to provide your care today.  For your lower extremity swelling I think getting good compression socks and walking is much as you are able to.  We will also increase your torsemide to 40 mg for the next 3 days which will help remove some of the extra fluid.  We will see you again next week if it is not getting any worse but if it is getting worse please not hesitate to call the office and come in on Friday to be reevaluated.  Follow up:  about 1 week     Remember:     Should you have any questions or concerns please call the internal medicine clinic at [phone].     Rocky Morel, DO [facility]

## 2023-10-16 ENCOUNTER — Telehealth: Payer: Self-pay | Admitting: Cardiovascular Disease

## 2023-10-16 DIAGNOSIS — I872 Venous insufficiency (chronic) (peripheral): Secondary | ICD-10-CM | POA: Insufficient documentation

## 2023-10-16 DIAGNOSIS — I480 Paroxysmal atrial fibrillation: Secondary | ICD-10-CM | POA: Diagnosis not present

## 2023-10-16 DIAGNOSIS — R001 Bradycardia, unspecified: Secondary | ICD-10-CM | POA: Diagnosis not present

## 2023-10-16 NOTE — Telephone Encounter (Signed)
Abnormal results
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
## 2023-10-16 NOTE — Telephone Encounter (Signed)
   Cardiac Monitor Alert  Date of alert:  10/16/2023   Patient Name: Juan Calderon  DOB: Mar 20, 1956  MRN: [id number]   [facility] Cardiologist: Verne Carrow, MD  [facility] EP:  Sherryl Manges, MD    Monitor Information: Long Term Monitor [ZioXT]  Reason:  Slow afib - 30 BPM lasting for greater than 60 seconds 10/05/23 at 12:01 PM,  Page 11-12, strip 2 Ordering provider:  Jari Favre, PA-C   Alert Atrial Fibrillation/Flutter This is the 1st alert for this rhythm.  The patient has a hx of Atrial Fibrillation/Flutter.  The patient is currently on anticoagulation.  Anticoagulation medication as of 10/16/2023           ELIQUIS 5 MG TABS tablet TAKE 1 TABLET BY MOUTH TWICE A DAY       Next Cardiology Appointment   Date:  10/28/23  Provider:  Jari Favre, PA-C  The patient was contacted today.  He is asymptomatic. Dr. Jacinto Halim (DOD 10/16/23) recommended to continue to monitor/ observe and discuss at next office visit.     Cherylann Banas, [place]  10/16/2023 12:26 PM

## 2023-10-17 NOTE — Progress Notes (Signed)
Internal Medicine Clinic Attending  I was physically present during the key portions of the resident provided service and participated in the medical decision making of patient's management care. I reviewed pertinent patient test results.  The assessment, diagnosis, and plan were formulated together and I agree with the documentation in the resident's note.  Erlinda Hong, MD FACP

## 2023-10-18 ENCOUNTER — Telehealth: Payer: Self-pay

## 2023-10-18 DIAGNOSIS — I480 Paroxysmal atrial fibrillation: Secondary | ICD-10-CM

## 2023-10-18 NOTE — Telephone Encounter (Signed)
-----   Message from Sharlene Dory sent at 10/18/2023  1:13 PM EST ----- Can we get this patient in ASAP for EP eval, possible PPM?  Please let the patient know the below comments regarding his monitor  Thanks! Sharlene Dory, PA-C ----- Message ----- From: Kathleene Hazel, MD Sent: 10/18/2023  11:59 AM EST To: Sharlene Dory, [place], He has slow atrial fib at times and some pauses, longest 3.5 seconds. We should get him into EP to discuss a pacemaker. Thayer Ohm

## 2023-10-18 NOTE — Telephone Encounter (Signed)
Attempted to speak with pt but LVM per DPR on file. Explained the heart monitor result and Jari Favre, PA-C recommendations of referral to EP for possible PPM. Order for EP referral has been placed and a message has been sent to scheduling. Pt told to contact our office with questions.

## 2023-10-19 ENCOUNTER — Other Ambulatory Visit: Payer: Self-pay | Admitting: Student in an Organized Health Care Education/Training Program

## 2023-10-19 DIAGNOSIS — I10 Essential (primary) hypertension: Secondary | ICD-10-CM

## 2023-10-20 NOTE — Progress Notes (Unsigned)
Electrophysiology Office Note:   Date:  10/21/2023  ID:  Juan Calderon, DOB 06/26/1956, MRN [id number]  Primary Cardiologist: Juan Carrow, MD Primary Heart Failure: None Electrophysiologist: Juan Manges, MD      History of Present Illness:   Juan Calderon is a 68 y.o. male with h/o nonobstructive coronary artery disease, persistent atrial fibrillation/flutter, PVCs, COPD, hypertension, diabetes, CKD, obesity seen today for routine electrophysiology followup.   He is currently on flecainide for his atrial fibrillation.  He is presented to the hospital multiple times with chest pain and was ruled out for ischemia.  He does have chronic shortness of breath.  He has also had episodes where he is felt near syncopal.  He wore a recent cardiac monitor that showed a 100% atrial fibrillation burden.  He was unaware of his atrial fibrillation during that time.  He additionally states that since seeing cardiology, he has had no near syncopal episodes.  His metoprolol was stopped.  He did have some episodic bradycardia on his monitor, but nothing that was prolonged.  .  he denies chest pain, palpitations, dyspnea, PND, orthopnea, nausea, vomiting, dizziness, syncope, edema, weight gain, or early satiety.   Review of systems complete and found to be negative unless listed in HPI.   EP Information / Studies Reviewed:    EKG is ordered today. Personal review as below.    Sinus rhythm   Risk Assessment/Calculations:    CHA2DS2-VASc Score = 5   This indicates a 7.2% annual risk of stroke. The patient's score is based upon: CHF History: 1 HTN History: 1 Diabetes History: 1 Stroke History: 0 Vascular Disease History: 1 Age Score: 1 Gender Score: 0             Physical Exam:   VS:  BP 138/74 (BP Location: Left Arm, Patient Position: Sitting, Cuff Size: Large)   Pulse 78   Ht 6\' 1"  (1.854 m)   Wt (!) 301 lb (136.5 kg)   SpO2 92%   BMI 39.71 kg/m    Wt Readings from Last 3  Encounters:  10/21/23 (!) 301 lb (136.5 kg)  10/15/23 (!) 308 lb 6.4 oz (139.9 kg)  10/04/23 (!) 303 lb 12.8 oz (137.8 kg)     GEN: Well nourished, well developed in no acute distress NECK: No JVD; No carotid bruits CARDIAC: Regular rate and rhythm, no murmurs, rubs, gallops RESPIRATORY:  Clear to auscultation without rales, wheezing or rhonchi  ABDOMEN: Soft, non-tender, non-distended EXTREMITIES:  No edema; No deformity   ASSESSMENT AND PLAN:    1.  Persistent atrial fibrillation: Currently on flecainide 100 mg twice daily.  He wore a cardiac monitor with a 100% atrial fibrillation burden.  Is unaware of his atrial fibrillation.  He was also having significant bradycardia.  His metoprolol has been stopped.  He has not had any further bradycardic symptoms.  His bradycardic symptoms return, he would benefit from pacemaker implant for sick sinus syndrome.  For now, we Juan Calderon continue with current management.  If he does not have any tachycardia while in atrial fibrillation, as diagnosed on his monitor, would continue flecainide without beta-blocker.  2.  Chronic diastolic heart failure: No obvious volume overload.  Plan per primary cardiology.  3.  Coronary artery disease: No current chest pain.  Plan per primary team.  4.  Secondary hypercoagulable state: Currently on Eliquis for atrial fibrillation  5.  Sick sinus syndrome: Juan Calderon hold off on pacemaker implant for now.  If he does develop  symptoms of a bradycardia off beta-blockers, pacemaker implant would be reasonable.  Follow up with Dr. Elberta Calderon  as needed   Signed, Juan Calderon Juan Loa, MD

## 2023-10-21 ENCOUNTER — Encounter: Payer: Self-pay | Admitting: Cardiology

## 2023-10-21 ENCOUNTER — Ambulatory Visit: Payer: 59 | Attending: Cardiology | Admitting: Cardiology

## 2023-10-21 VITALS — BP 138/74 | HR 78 | Ht 73.0 in | Wt 301.0 lb

## 2023-10-21 DIAGNOSIS — I2510 Atherosclerotic heart disease of native coronary artery without angina pectoris: Secondary | ICD-10-CM

## 2023-10-21 DIAGNOSIS — D6869 Other thrombophilia: Secondary | ICD-10-CM

## 2023-10-21 DIAGNOSIS — I5032 Chronic diastolic (congestive) heart failure: Secondary | ICD-10-CM

## 2023-10-21 DIAGNOSIS — I480 Paroxysmal atrial fibrillation: Secondary | ICD-10-CM | POA: Diagnosis not present

## 2023-10-21 DIAGNOSIS — R001 Bradycardia, unspecified: Secondary | ICD-10-CM

## 2023-10-21 NOTE — Telephone Encounter (Signed)
Next appt scheduled tomorrow 10/22/23.

## 2023-10-22 ENCOUNTER — Ambulatory Visit: Payer: 59 | Admitting: Student

## 2023-10-22 VITALS — BP 173/78 | HR 63 | Temp 97.5°F | Ht 73.0 in | Wt 308.3 lb

## 2023-10-22 DIAGNOSIS — I872 Venous insufficiency (chronic) (peripheral): Secondary | ICD-10-CM | POA: Diagnosis not present

## 2023-10-22 DIAGNOSIS — Z79891 Long term (current) use of opiate analgesic: Secondary | ICD-10-CM

## 2023-10-22 DIAGNOSIS — I10 Essential (primary) hypertension: Secondary | ICD-10-CM | POA: Diagnosis not present

## 2023-10-22 MED ORDER — OXYCODONE-ACETAMINOPHEN 10-325 MG PO TABS
1.0000 | ORAL_TABLET | Freq: Four times a day (QID) | ORAL | 0 refills | Status: DC | PRN
Start: 2023-10-22 — End: 2023-11-11

## 2023-10-22 NOTE — Progress Notes (Unsigned)
   CC: Routine Follow Up for lower extremity edema after last office visit 10/15/2023  HPI:  Juan Calderon is a 68 y.o. male with pertinent PMH of HTN, HFpEF, T2DM, COPD, PAF, CKD stage IIIa, and venous insufficiency who presents as above. Please see assessment and plan below for further details.  Review of Systems:   Pertinent items noted in HPI and/or A&P.  Physical Exam:  Vitals:   10/22/23 1555  BP: (!) 173/78  Pulse: 63  Temp: (!) 97.5 F (36.4 C)  TempSrc: Oral  SpO2: 97%  Weight: (!) 308 lb 4.8 oz (139.8 kg)  Height: 6\' 1"  (1.854 m)    Constitutional: Well-appearing adult male. In no acute distress. Pulm: Normal work of breathing on room air. MSK: Overall improved lower extremity exam with trace pitting edema in the right lower extremity and trace-1+ pitting edema in the left lower extremity with significant scar tissue primarily between the knee and ankle, small blisters on the lateral posterior left lower extremity without weeping. Skin:Warm and dry. Neuro:Alert and oriented x3. No focal deficit noted. Psych:Pleasant mood and affect.   Assessment & Plan:   Venous insufficiency Presents for follow-up after being seen last week due to worsening of his left lower extremity edema with concerns for wound development.  He has had a couple of blisters pop up but overall his exam is improved after 4 to 5 days of increased torsemide dosing and compression stockings.  Unfortunately the compression stockings were being pushed down his leg when he would walk around so he is going to try a larger size.  In the meantime we will do 5 more days and increase torsemide and check a BMP today.  BMP today with stable electrolytes and renal function. - Increase torsemide to 40 mg daily for 5 days, continue with compression therapy and weightbearing exercise as tolerated - Return in 2 weeks for reevaluation    Patient discussed with Dr. Julieanne Cotton, DO Internal Medicine  Center Internal Medicine Resident PGY-2 Clinic Phone: [phone] Pager: [phone]

## 2023-10-22 NOTE — Patient Instructions (Addendum)
  Thank you, [name], for allowing [place] to provide your care today.  For the next 5 days I would like you to take 2 torsemide pills.  If you feel like the swelling is back to normal after this you can go back to 1 pill a day but if it is still not getting better and you have gotten your new compression socks let us know and we might continue to give you more of the fluid pill.  We are checking your kidney function and electrolytes today.  I will call you if there are any abnormalities on this test.   Follow up: 2 Weeks    Remember:     Should you have any questions or concerns please call the internal medicine clinic at [phone].     Rocky Morel, DO [facility]

## 2023-10-23 LAB — BMP8+ANION GAP
Anion Gap: 18 mmol/L (ref 10.0–18.0)
BUN/Creatinine Ratio: 11 (ref 10–24)
BUN: 17 mg/dL (ref 8–27)
CO2: 27 mmol/L (ref 20–29)
Calcium: 9.4 mg/dL (ref 8.6–10.2)
Chloride: 97 mmol/L (ref 96–106)
Creatinine, Ser: 1.48 mg/dL — ABNORMAL HIGH (ref 0.76–1.27)
Glucose: 155 mg/dL — ABNORMAL HIGH (ref 70–99)
Potassium: 4.4 mmol/L (ref 3.5–5.2)
Sodium: 142 mmol/L (ref 134–144)
eGFR: 51 mL/min/{1.73_m2} — ABNORMAL LOW (ref >59)

## 2023-10-23 NOTE — Assessment & Plan Note (Addendum)
Presents for follow-up after being seen last week due to worsening of his left lower extremity edema with concerns for wound development.  He has had a couple of blisters pop up but overall his exam is improved after 4 to 5 days of increased torsemide dosing and compression stockings.  Unfortunately the compression stockings were being pushed down his leg when he would walk around so he is going to try a larger size.  In the meantime we will do 5 more days and increase torsemide and check a BMP today.  BMP today with stable electrolytes and renal function. - Increase torsemide to 40 mg daily for 5 days, continue with compression therapy and weightbearing exercise as tolerated - Return in 2 weeks for reevaluation

## 2023-10-23 NOTE — Progress Notes (Signed)
 Internal Medicine Clinic Attending  Case discussed with the resident physician at the time of the visit.  We reviewed the patient's history, exam, and pertinent patient test results.  I agree with the assessment, diagnosis, and plan of care documented in the resident's note.

## 2023-10-28 ENCOUNTER — Ambulatory Visit: Payer: 59 | Attending: Physician Assistant | Admitting: Physician Assistant
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
## 2023-11-05 DIAGNOSIS — H04123 Dry eye syndrome of bilateral lacrimal glands: Secondary | ICD-10-CM | POA: Diagnosis not present

## 2023-11-05 DIAGNOSIS — G4730 Sleep apnea, unspecified: Secondary | ICD-10-CM | POA: Diagnosis not present

## 2023-11-05 DIAGNOSIS — E119 Type 2 diabetes mellitus without complications: Secondary | ICD-10-CM | POA: Diagnosis not present

## 2023-11-05 DIAGNOSIS — H1045 Other chronic allergic conjunctivitis: Secondary | ICD-10-CM | POA: Diagnosis not present

## 2023-11-05 DIAGNOSIS — H40013 Open angle with borderline findings, low risk, bilateral: Secondary | ICD-10-CM | POA: Diagnosis not present

## 2023-11-05 DIAGNOSIS — H26493 Other secondary cataract, bilateral: Secondary | ICD-10-CM | POA: Diagnosis not present

## 2023-11-05 LAB — HM DIABETES EYE EXAM

## 2023-11-11 ENCOUNTER — Ambulatory Visit (INDEPENDENT_AMBULATORY_CARE_PROVIDER_SITE_OTHER): Payer: 59 | Admitting: Internal Medicine

## 2023-11-11 VITALS — BP 136/71 | HR 72 | Temp 97.8°F | Ht 73.0 in | Wt 305.6 lb

## 2023-11-11 DIAGNOSIS — K5903 Drug induced constipation: Secondary | ICD-10-CM

## 2023-11-11 DIAGNOSIS — E785 Hyperlipidemia, unspecified: Secondary | ICD-10-CM

## 2023-11-11 DIAGNOSIS — K219 Gastro-esophageal reflux disease without esophagitis: Secondary | ICD-10-CM

## 2023-11-11 DIAGNOSIS — I5032 Chronic diastolic (congestive) heart failure: Secondary | ICD-10-CM | POA: Diagnosis not present

## 2023-11-11 DIAGNOSIS — N521 Erectile dysfunction due to diseases classified elsewhere: Secondary | ICD-10-CM | POA: Diagnosis not present

## 2023-11-11 DIAGNOSIS — T402X5A Adverse effect of other opioids, initial encounter: Secondary | ICD-10-CM

## 2023-11-11 DIAGNOSIS — Z79891 Long term (current) use of opiate analgesic: Secondary | ICD-10-CM

## 2023-11-11 DIAGNOSIS — E1140 Type 2 diabetes mellitus with diabetic neuropathy, unspecified: Secondary | ICD-10-CM

## 2023-11-11 DIAGNOSIS — E7800 Pure hypercholesterolemia, unspecified: Secondary | ICD-10-CM

## 2023-11-11 LAB — POCT GLYCOSYLATED HEMOGLOBIN (HGB A1C): Hemoglobin A1C: 6.8 % — AB (ref 4.0–5.6)

## 2023-11-11 LAB — GLUCOSE, CAPILLARY: Glucose-Capillary: 144 mg/dL — ABNORMAL HIGH (ref 70–99)

## 2023-11-11 MED ORDER — OXYCODONE-ACETAMINOPHEN 10-325 MG PO TABS: 1.0000 | ORAL_TABLET | Freq: Four times a day (QID) | ORAL | 0 refills | Status: DC | PRN

## 2023-11-11 MED ORDER — SORBITOL 70 % PO SOLN: 30.0000 mL | Freq: Every day | ORAL | 0 refills | Status: AC | PRN

## 2023-11-11 MED ORDER — PANTOPRAZOLE SODIUM 40 MG PO TBEC: 40.0000 mg | DELAYED_RELEASE_TABLET | Freq: Every day | ORAL | 11 refills | Status: DC

## 2023-11-11 NOTE — Assessment & Plan Note (Signed)
 A1c remains well-controlled.  Medications include metformin at 1000 mg twice daily. -A1c well-controlled -Urine microalbumin to creatinine ratio, would consider adding SGLT2 if elevated.  PCP was hesitant to do this in the past in setting of polypharmacy

## 2023-11-11 NOTE — Assessment & Plan Note (Signed)
 He has been taking famotidine for some time for acid reflux.  He has burning in his esophagus that is worse at night when he lays down.  He denies weight loss or difficulty swallowing.  Will switch from H2 blocker to PPI. -Stop famotidine -Start pantoprazole 40 mg daily -Follow-up in 1 month if no improvement
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
## 2023-11-11 NOTE — Patient Instructions (Addendum)
 Thank you, [name] for allowing [place] to provide your care today.   Diabetes- I am rechecking your bloodwork and urine for your diabetes. Continue taking your medications as written. I do think you would benefit from a once weekly injectable medication that would help with heart failure, diabetes and weight loss.  Leg swelling I am checking your labs today and will plan to continue torsemide at same dose if those look ok.  Pain in left leg and back I am refilling you pain medications for next 3 months. You got the once yearly drug test today.  I sent in a refill of sorbitol to help with constipation. You can also use miralax or senna which are over the counter.  Acid reflux I am switching your medication from famotidine to pantoprazole to help with symptoms.   I have ordered the following labs for you:   Lab Orders         Glucose, capillary         Microalbumin / Creatinine Urine Ratio         Lipid Profile         POC Hbg A1C      I have ordered the following medication/changed the following medications:   Stop the following medications: There are no discontinued medications.   Start the following medications: No orders of the defined types were placed in this encounter.    Follow up:  1 month to make sure acid reflux is improved  We look forward to seeing you next time. Please call our clinic at [phone] if you have any questions or concerns. The best time to call is Monday-Friday from 9am-4pm, but there is someone available 24/7. If after hours or the weekend, call the main hospital number and ask for the Internal Medicine Resident On-Call. If you need medication refills, please notify your pharmacy one week in advance and they will send [place] a request.   Thank you for trusting me with your care. Wishing you the best!   Rudene Christians, DO [facility]

## 2023-11-11 NOTE — Assessment & Plan Note (Signed)
 Last lipid panel done in March 2024.  LDL at 36 at that time.  He reports adherence with atorvastatin 20 mg -repeat lipid panel -Continue atorvastatin 20 mg

## 2023-11-11 NOTE — Assessment & Plan Note (Signed)
 He follows with cardiology for HFpEF and atrial fibrillation.  For HFpEF he is taking torsemide 40 mg for the last month.  Prior to this he was prescribed torsemide 20 mg daily.  His weight is stable from last visit and he remains NYHA class II-III.  He had low blood pressures at cardiology visit in January and losartan was decreased from 50 mg to 25 mg.  Other medications to control his blood pressure include terazosin 5 mg.  Initially his blood pressure was elevated to 146/70.  On recheck improved to 136/71.  P: Recheck BMP with higher dose of torsemide for the last month.  Renal function is stable we will plan to continue torsemide 40 mg daily
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
## 2023-11-12 DIAGNOSIS — H26491 Other secondary cataract, right eye: Secondary | ICD-10-CM | POA: Diagnosis not present

## 2023-11-12 LAB — BMP8+ANION GAP
Anion Gap: 18 mmol/L (ref 10.0–18.0)
BUN/Creatinine Ratio: 15 (ref 10–24)
BUN: 28 mg/dL — ABNORMAL HIGH (ref 8–27)
CO2: 25 mmol/L (ref 20–29)
Calcium: 9.7 mg/dL (ref 8.6–10.2)
Chloride: 98 mmol/L (ref 96–106)
Creatinine, Ser: 1.85 mg/dL — ABNORMAL HIGH (ref 0.76–1.27)
Glucose: 142 mg/dL — ABNORMAL HIGH (ref 70–99)
Potassium: 4 mmol/L (ref 3.5–5.2)
Sodium: 141 mmol/L (ref 134–144)
eGFR: 39 mL/min/{1.73_m2} — ABNORMAL LOW (ref >59)

## 2023-11-12 LAB — LIPID PANEL
Chol/HDL Ratio: 1.9 ratio (ref 0.0–5.0)
Cholesterol, Total: 100 mg/dL (ref 100–199)
HDL: 54 mg/dL (ref >39)
LDL Chol Calc (NIH): 21 mg/dL (ref 0–99)
Triglycerides: 148 mg/dL (ref 0–149)
VLDL Cholesterol Cal: 25 mg/dL (ref 5–40)

## 2023-11-12 LAB — MICROALBUMIN / CREATININE URINE RATIO
Creatinine, Urine: 37 mg/dL
Microalb/Creat Ratio: <8 mg/g{creat} (ref 0–29)
Microalbumin, Urine: <3 ug/mL

## 2023-11-12 NOTE — Progress Notes (Signed)
 Internal Medicine Clinic Attending  Case discussed with the resident at the time of the visit.  We reviewed the resident's history and exam and pertinent patient test results.  I agree with the assessment, diagnosis, and plan of care documented in the resident's note.

## 2023-11-13 ENCOUNTER — Telehealth: Payer: Self-pay | Admitting: *Deleted

## 2023-11-13 NOTE — Telephone Encounter (Signed)
 Copied from CRM [id number]. Topic: Clinical - Lab/Test Results >> Nov 13, 2023  3:46 PM Alcus Dad H wrote: Reason for CRM: Results have been relayed, patient is aware to decrease torsemide to 1 daily and call clinic if he noticing 5 pound weight gain in 1-2 days. Patient has no further questions.

## 2023-11-14 LAB — TOXASSURE SELECT,+ANTIDEPR,UR

## 2023-11-15 ENCOUNTER — Telehealth: Payer: Self-pay | Admitting: *Deleted

## 2023-11-15 NOTE — Telephone Encounter (Signed)
 Copied from CRM [id number]. Topic: General - Call Back - No Documentation >> Nov 15, 2023 12:04 PM Prudencio Pair wrote: Reason for CRM: Patient states he received a call today & missed the call. Do not see anywhere that someone reached out to patient other than on 11/13/23 which patient states he spoke to nurse on that date. Please give patient a call back. CB #: [phone].

## 2023-11-15 NOTE — Telephone Encounter (Signed)
 Will forward to PCP

## 2023-11-15 NOTE — Telephone Encounter (Signed)
 I did not call.

## 2023-12-12 ENCOUNTER — Encounter: Payer: Self-pay | Admitting: Internal Medicine

## 2023-12-12 ENCOUNTER — Ambulatory Visit: Admitting: Internal Medicine

## 2023-12-12 ENCOUNTER — Encounter: Payer: Self-pay | Admitting: Cardiovascular Disease

## 2023-12-12 VITALS — BP 136/69 | HR 66 | Temp 98.2°F | Ht 73.0 in | Wt 309.2 lb

## 2023-12-12 DIAGNOSIS — K219 Gastro-esophageal reflux disease without esophagitis: Secondary | ICD-10-CM | POA: Diagnosis not present

## 2023-12-12 DIAGNOSIS — I5032 Chronic diastolic (congestive) heart failure: Secondary | ICD-10-CM | POA: Diagnosis not present

## 2023-12-12 DIAGNOSIS — I872 Venous insufficiency (chronic) (peripheral): Secondary | ICD-10-CM | POA: Diagnosis not present

## 2023-12-12 DIAGNOSIS — Z79891 Long term (current) use of opiate analgesic: Secondary | ICD-10-CM

## 2023-12-12 NOTE — Assessment & Plan Note (Signed)
 Patient began taking pantoprazole at last visit and relays that this has resolved his sxs of GERD. Has no burning or acid reflux.   Plan: Patient may continue to take pantoprazole as prescribed and follow up in 3 months.
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
## 2023-12-12 NOTE — Assessment & Plan Note (Signed)
 Patient has experienced no blisters of lower left extremity since his last visit. He is wearing diabetic socks, and has not yet acquired compression socks. He feels his feet look about the same as last time. On physical exam, 1+ dorsalis pedis and posterior tibial pulses appreciated bilaterally with venous stasis dermatitis appreciated on LLE. He endorses more pain in his left leg than his right leg with occasional left leg swelling. He feels that his percocet gives him adequate pain relief .  Plan: Patient may continue to take percocet for chronic pain, currently has refills. Follow up in 3 months

## 2023-12-12 NOTE — Assessment & Plan Note (Addendum)
 He has a follow up with cardiology tomorrow. His weight is up 4 lb from his last visit. He is taking a new dose of torsemide. Today bp is stable 136/69. He endorses SOB at baseline and denies dyspnea on exertion, chest pain, presyncope, dizziness. He notes that he has consistent swelling of the left lower extremity.   Plan on continuing toresemide, pending cardiology visit tomorrow.
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
## 2023-12-13 ENCOUNTER — Ambulatory Visit: Payer: 59 | Admitting: Physician Assistant

## 2023-12-19 DIAGNOSIS — G4733 Obstructive sleep apnea (adult) (pediatric): Secondary | ICD-10-CM | POA: Diagnosis not present

## 2023-12-24 ENCOUNTER — Other Ambulatory Visit: Payer: Self-pay | Admitting: Student in an Organized Health Care Education/Training Program

## 2023-12-24 DIAGNOSIS — I5032 Chronic diastolic (congestive) heart failure: Secondary | ICD-10-CM

## 2024-01-01 NOTE — Progress Notes (Unsigned)
 Cardiology Clinic Note   Patient Name: Juan Calderon Date of Encounter: 01/07/2024  Primary Care Provider:  Priscella Brooms, DO Primary Cardiologist:  Antoinette Batman, MD  Patient Profile     Juan Calderon 68 year old male presents to the clinic today for follow-up evaluation of his HFpEF and paroxysmal atrial fibrillation.  Past Medical History    Past Medical History:  Diagnosis Date   A-fib [facility])    Alcohol abuse     Ascending aorta dilation (HCC) 02/04/2023   TTE 10/2021: 42 mm  TTE 03/06/2023: EF 60-65, no RWMA, moderate LVH, normal RVSF, small pericardial effusion, RAP 3, normal aorta size (root 37 mm, ascending aorta 39 mm)   Asthma    Bradycardia    C6 radiculopathy 01/24/2016   Right upper extremity, mild to moderate electrically by EMG on 01/24/2016   Cataract    Left eye   Chronic diastolic heart failure (HCC)     with mild left ventricular hypertrophy on Echo 02/2010   Chronic kidney disease 02/28/2015   Chronic obstructive pulmonary disease (HCC)     Chronic osteomyelitis of femur (HCC) 04/06/2016   Chronic osteomyelitis of left femur (HCC) 11/22/2017   Left femur s/p prior trauma   Chronic osteomyelitis of left femur (HCC) 11/22/2017   Brodie's abscess: left femur s/p prior trauma.  Underwent partial excision and curettage of left femoral osteomyelitis at [facility] 12/30/2017 with grossly purulent material encountered within the medullary canal of the left distal femur.  Cultures grew MSSA.  Post-operatively received 6 weeks of IV antibiotics through 02/10/2018.  CRP elevated at 60.3 at end of IV antibiotic course so continued on Keflex   Chronic pain syndrome     Left arm and leg s/p traumatic injury    Chronic renal insufficiency     Coronary artery disease     25% LAD stenosis on cath 2007.  Stable angina.   Diverticulosis     Diverticulosis 11/12/2013   Essential hypertension     Frequent PVCs    Gastroesophageal reflux disease     Gout     Hyperlipidemia  LDL goal < 100     Internal hemorrhoids without complication 08/12/2018   Long-term current use of opiate analgesic 09/07/2016   Mild carpal tunnel syndrome of right wrist 01/24/2016   Mild degree electrically per EMG 01/24/2016    Mild carpal tunnel syndrome of right wrist 01/24/2016   Mild degree electrically per EMG 01/24/2016    Morbid obesity with BMI of 40.0-44.9, adult (HCC)     Normocytic anemia     NSVT (nonsustained ventricular tachycardia) (HCC)    Obstructive sleep apnea     Moderate, AHI 29.8 per hour with moderately loud snoring and oxygen  desaturation to a nadir of 79%. CPAP titration resulted in a prescription for 17 CWP.     Open-angle glaucoma     Osteoarthritis cervical spine     Osteoarthritis of left knee 06/19/2013   Tricompartmental disease.  Treated with double hinged upright knee brace, steroid/xylocaine  knee injections, and NSAIDs    Osteoporosis 05/14/2017   s/p fracture of the right humerus from a fall at ground hight   Persistent atrial fibrillation (HCC)    Right rotator cuff tear     Large full-thickness tear of the supraspinatus with mild retraction but no atrophy    Right rotator cuff tear 04/25/2013   Large full-thickness tear of the supraspinatus with mild retraction but no atrophy     Secondary  male hypogonadism 02/07/2017   Likely secondary to chronic opioid use   Secondary male hypogonadism 02/07/2017   Likely secondary to chronic opioid use   Sleep apnea    Subclinical hypothyroidism     Tubular adenoma of colon 11/22/2017   Specifics unknown.  Repeat colonoscopy 08/12/2018 with six 3-6 mm tubular adenomas removed endoscopically.   Type II diabetes mellitus with neuropathy causing erectile dysfunction (HCC)     Vasomotor rhinitis 04/25/2013   Past Surgical History:  Procedure Laterality Date   A-FLUTTER ABLATION N/A 03/24/2019   Procedure: A-FLUTTER ABLATION;  Surgeon: Tammie Fall, MD;  Location: [facility];  Service:  Cardiovascular;  Laterality: N/A;   CARDIOVERSION N/A 12/30/2014   Procedure: CARDIOVERSION;  Surgeon: Hazle Lites, MD;  Location: [facility];  Service: Cardiovascular;  Laterality: N/A;   FRACTURE SURGERY Left 1980's   Elbow   Left arm surgery     Left leg surgery     SHOULDER ARTHROSCOPY WITH DISTAL CLAVICLE RESECTION Left 07/05/2022   Procedure: SHOULDER ARTHROSCOPY WITH DISTAL CLAVICLE EXCISION;  Surgeon: Micheline Ahr, MD;  Location: [facility];  Service: Orthopedics;  Laterality: Left;   SHOULDER ARTHROSCOPY WITH SUBACROMIAL DECOMPRESSION, ROTATOR CUFF REPAIR AND BICEP TENDON REPAIR Left 07/05/2022   Procedure: SHOULDER ARTHROSCOPY WITH SUBACROMIAL DECOMPRESSION, ROTATOR CUFF REPAIR AND BICEP TENDON TENOTOMY;  Surgeon: Micheline Ahr, MD;  Location:  SURGERY CENTER;  Service: Orthopedics;  Laterality: Left;   SHOULDER SURGERY     Right    Allergies  Allergies  Allergen Reactions   Ramipril Swelling    Facial swelling   Tape Other (See Comments)    Irritates skin/ pls use paper tape   Testosterone  Rash    History of Present Illness     AAYAAN AL has a PMH of nonobstructive coronary artery disease, paroxysmal/persistent atrial fibrillation/flutter, PVCs, COPD, HTN, CKD, diabetes, and obesity.  CHA2DS2-VASc score 5.  He was recently seen in follow-up by Dr. Lawana Pray on 10/21/2023.  He was maintained on flecainide .  He had been seen multiple times in the hospital with chest pain.  He was ruled out for ischemia.  He noted chronic shortness of breath.  He also had reported an episode of near syncope.  He wore a cardiac event monitor which showed 100% A-fib burden.  He was cardiac unaware.  He reported following up with cardiology.  His metoprolol  was discontinued.  Of note he did have some short episodes of bradycardia that were not prolonged/sustained on monitor.  He denied chest pain, palpitations, PND, weight gain, and early satiety.  His blood pressure  was noted to be 138/74.  His pulse was 78.  He was noted to be in sinus rhythm.  He presents to the clinic today for f/u and states he has noticed some swelling in his lower extremities.  His weight today is 308 pounds.  This is down from 309 pounds.  He reports compliance with his torsemide .  We reviewed the importance of salt restriction and fluid restriction.  I recommended lower extremity support stockings.  He denies increased work of breathing.  We reviewed his previous echocardiogram.  His EKG today is reassuring.  I will plan follow-up in 6 months.  Today he denies chest pain, shortness of breath, lower extremity edema, fatigue, palpitations, melena, hematuria, hemoptysis, diaphoresis, weakness, presyncope, syncope, orthopnea, and PND.   Home Medications    Prior to Admission medications   Medication Sig Start Date End Date Taking?  Authorizing Provider  albuterol  (VENTOLIN  HFA) 108 (90 Base) MCG/ACT inhaler INHALE 1-2 PUFFS INTO THE LUNGS EVERY 6 (SIX) HOURS AS NEEDED FOR SHORTNESS OF BREATH. 05/08/23   Ether Hercules, MD  allopurinol  (ZYLOPRIM ) 300 MG tablet TAKE 1 TABLET BY MOUTH EVERY DAY 03/04/23   Ether Hercules, MD  atorvastatin  (LIPITOR) 20 MG tablet TAKE 1 TABLET BY MOUTH EVERY DAY 04/08/23   Lasandra Points, MD  Blood Glucose Monitoring Suppl (ONETOUCH VERIO) w/Device KIT 1 each by Does not apply route daily. 11/04/15   Maurice Sousa, MD  Calcium  Citrate-Vitamin D  315-5 MG-MCG TABS TAKE 2 TABLETS BY MOUTH EVERY DAY 04/25/23   Vincent, Duncan Thomas, MD  [facility] VITAMIN B12 1000 MCG tablet TAKE 1 TABLET BY MOUTH EVERY DAY 03/06/23   Ether Hercules, MD  ELIQUIS  5 MG TABS tablet TAKE 1 TABLET BY MOUTH TWICE A DAY 08/05/23   Ether Hercules, MD  flecainide  (TAMBOCOR ) 100 MG tablet TAKE 1 TABLET BY MOUTH TWICE A DAY 02/28/23   Marlyse Single T, PA-C  glucose blood (ONETOUCH VERIO) test strip Use as instructed 05/24/16   Francia Ip, MD  latanoprost  (XALATAN ) 0.005 %  ophthalmic solution Place 1 drop into both eyes at bedtime. 10/11/16   Francia Ip, MD  losartan  (COZAAR ) 25 MG tablet Take 1 tablet (25 mg total) by mouth daily. 10/04/23   Von Grumbling, PA-C  magnesium  oxide (MAG-OX) 400 (240 Mg) MG tablet Take 2 tablets (800 mg total) by mouth daily. 07/04/23   Ether Hercules, MD  metFORMIN  (GLUCOPHAGE ) 1000 MG tablet TAKE 1 TABLET (1,000 MG TOTAL) BY MOUTH TWICE A DAY WITH FOOD 05/08/23   Ether Hercules, MD  nitroGLYCERIN  (NITROSTAT ) 0.4 MG SL tablet Place 1 tablet (0.4 mg total) under the tongue every 5 (five) minutes as needed for chest pain. 10/04/23   Von Grumbling, PA-C  ONETOUCH DELICA LANCETS 33G MISC Use 1 strip daily 05/24/16   Francia Ip, MD  oxyCODONE -acetaminophen  (PERCOCET) 10-325 MG tablet Take 1 tablet by mouth every 6 (six) hours as needed for pain. 11/15/23   Masters, Katie, DO  oxyCODONE -acetaminophen  (PERCOCET) 10-325 MG tablet Take 1 tablet by mouth every 6 (six) hours as needed for pain. 12/13/23   Masters, Alston Jerry, DO  oxyCODONE -acetaminophen  (PERCOCET) 10-325 MG tablet Take 1 tablet by mouth every 6 (six) hours as needed for pain. 01/10/24   Masters, Katie, DO  pantoprazole  (PROTONIX ) 40 MG tablet Take 1 tablet (40 mg total) by mouth daily. 11/11/23   Masters, Alston Jerry, DO  senna (SENOKOT) 8.6 MG TABS tablet Take 2 tablets (17.2 mg total) by mouth daily. 06/24/23   Ether Hercules, MD  sorbitol  70 % solution Take 30 mLs by mouth daily as needed. 11/11/23   Masters, Katie, DO  tadalafil  (CIALIS ) 10 MG tablet Take 1 tablet (10 mg total) by mouth as needed for erectile dysfunction. 01/23/21 01/23/22  Ether Hercules, MD  terazosin  (HYTRIN ) 5 MG capsule TAKE 1 CAPSULE BY MOUTH EVERYDAY AT BEDTIME 10/21/23   Ether Hercules, MD  torsemide  (DEMADEX ) 20 MG tablet TAKE 1 TABLET BY MOUTH EVERY DAY 12/24/23   Priscella Brooms, DO  Wound Dressings (SORBSAN WOUND DRESSING) PADS Apply 10 each topically daily. 11/02/20   Karlyne Oxford, MD    Family History    Family History  Problem Relation Age of Onset   Heart failure Mother    Asthma Mother    Alzheimer's disease Father    Hypertension  Sister    Hypertension Sister    Hypertension Sister    Cancer Brother    Cancer Brother        unknown   Early death Brother        Gun Shot Wound   Cancer Brother        prostate   Prostate cancer Brother    Arthritis Son    Heart attack Neg Hx    Stroke Neg Hx    Colon cancer Neg Hx    Esophageal cancer Neg Hx    Pancreatic cancer Neg Hx    Stomach cancer Neg Hx    Liver disease Neg Hx    Rectal cancer Neg Hx    He indicated that his mother is deceased. He indicated that his father is deceased. He indicated that all of his five sisters are alive. He indicated that three of his nine brothers are alive. He indicated that his maternal grandmother is deceased. He indicated that his maternal grandfather is deceased. He indicated that his paternal grandmother is deceased. He indicated that his paternal grandfather is deceased. He indicated that his son is alive. He indicated that the status of his neg hx is unknown.  Social History    Social History   Socioeconomic History   Marital status: Widowed    Spouse name: Not on file   Number of children: Not on file   Years of education: Not on file   Highest education level: Not on file  Occupational History   Occupation: Disabled  Tobacco Use   Smoking status: Never   Smokeless tobacco: Never  Vaping Use   Vaping status: Never Used  Substance and Sexual Activity   Alcohol use: Not Currently    Alcohol/week: 14.0 standard drinks of alcohol    Types: 14 Cans of beer per week   Drug use: No   Sexual activity: Not Currently  Other Topics Concern   Not on file  Social History Narrative   Current Social History 12/14/2020      Patient lives with a friend in a home which is 1 story. There are 4 steps with handrails up to the entrance the patient uses.        Patient's method of transportation is personal car.      The highest level of education was high school diploma.      The patient currently disabled.      Identified important Relationships are "Family"       Pets : None       Interests / Fun: "Watch TV"       Current Stressors: "None"      Religious / Personal Beliefs: Blas Buoy, RN,BSN         Social Drivers of Health   Financial Resource Strain: Medium Risk (08/12/2023)   Overall Financial Resource Strain (CARDIA)    Difficulty of Paying Living Expenses: Somewhat hard  Food Insecurity: Food Insecurity Present (08/12/2023)   Hunger Vital Sign    Worried About Running Out of Food in the Last Year: Never true    Ran Out of Food in the Last Year: Sometimes true  Transportation Needs: No Transportation Needs (08/12/2023)   PRAPARE - Administrator, Civil Service (Medical): No    Lack of Transportation (Non-Medical): No  Physical Activity: Not on file  Stress: Not on file  Social Connections: Socially Isolated (08/12/2023)   Social Connection and Isolation Panel [NHANES]  Frequency of Communication with Friends and Family: Once a week    Frequency of Social Gatherings with Friends and Family: Once a week    Attends Religious Services: Never    Database administrator or Organizations: No    Attends Banker Meetings: Never    Marital Status: Widowed  Intimate Partner Violence: Not At Risk (08/12/2023)   Humiliation, Afraid, Rape, and Kick questionnaire    Fear of Current or Ex-Partner: No    Emotionally Abused: No    Physically Abused: No    Sexually Abused: No     Review of Systems    General:  No chills, fever, night sweats or weight changes.  Cardiovascular:  No chest pain, dyspnea on exertion, edema, orthopnea, palpitations, paroxysmal nocturnal dyspnea. Dermatological: No rash, lesions/masses Respiratory: No cough, dyspnea Urologic: No hematuria, dysuria Abdominal:   No  nausea, vomiting, diarrhea, bright red blood per rectum, melena, or hematemesis Neurologic:  No visual changes, wkns, changes in mental status. All other systems reviewed and are otherwise negative except as noted above.  Physical Exam    VS:  BP 130/60 (BP Location: Left Arm, Patient Position: Sitting, Cuff Size: Large)   Pulse 77   Ht 6' (1.829 m)   Wt (!) 308 lb (139.7 kg)   SpO2 94%   BMI 41.77 kg/m  , BMI Body mass index is 41.77 kg/m. GEN: Well nourished, well developed, in no acute distress. HEENT: normal. Neck: Supple, no JVD, carotid bruits, or masses. Cardiac: RRR, no murmurs, rubs, or gallops. No clubbing, cyanosis, generalized bilateral lower extremity Edema.  Radials/DP/PT 2+ and equal bilaterally.  Respiratory:  Respirations regular and unlabored, clear to auscultation bilaterally. GI: Soft, nontender, nondistended, BS + x 4. MS: no deformity or atrophy. Skin: warm and dry, no rash. Neuro:  Strength and sensation are intact. Psych: Normal affect.  Accessory Clinical Findings    Recent Labs: 05/13/2023: ALT 15; TSH 2.690 07/19/2023: Hemoglobin 11.9; Platelets 182 11/11/2023: BUN 28; Creatinine, Ser 1.85; Potassium 4.0; Sodium 141   Recent Lipid Panel    Component Value Date/Time   CHOL 100 11/11/2023 1429   TRIG 148 11/11/2023 1429   HDL 54 11/11/2023 1429   CHOLHDL 1.9 11/11/2023 1429   CHOLHDL 2.0 03/23/2019 1030   VLDL 24 03/23/2019 1030   LDLCALC 21 11/11/2023 1429         ECG personally reviewed by me today- EKG Interpretation Date/Time:  Tuesday Jan 07 2024 14:14:50 EDT Ventricular Rate:  74 PR Interval:  124 QRS Duration:  122 QT Interval:  422 QTC Calculation: 468 R Axis:   3  Text Interpretation: Normal sinus rhythm Non-specific intra-ventricular conduction delay T wave abnormality, consider lateral ischemia When compared with ECG of 07-Jan-2024 13:59, QT has shortened Confirmed by Lawana Pray [phone]) on 01/07/2024 2:16:18 PM    Echocardiogram 03/06/2023  IMPRESSIONS     1. Left ventricular ejection fraction, by estimation, is 60 to 65%. The  left ventricle has normal function. The left ventricle has no regional  wall motion abnormalities. There is moderate concentric left ventricular  hypertrophy. Left ventricular  diastolic parameters are indeterminate.   2. Right ventricular systolic function is normal. The right ventricular  size is normal.   3. A small pericardial effusion is present.   4. The mitral valve is normal in structure. No evidence of mitral valve  regurgitation.   5. The aortic valve is normal in structure. Aortic valve regurgitation is  not visualized.  6. The inferior vena cava is normal in size with greater than 50%  respiratory variability, suggesting right atrial pressure of 3 mmHg.   FINDINGS   Left Ventricle: Left ventricular ejection fraction, by estimation, is 60  to 65%. The left ventricle has normal function. The left ventricle has no  regional wall motion abnormalities. The left ventricular internal cavity  size was normal in size. There is   moderate concentric left ventricular hypertrophy. Left ventricular  diastolic parameters are indeterminate.   Right Ventricle: The right ventricular size is normal. Right ventricular  systolic function is normal.   Left Atrium: Left atrial size was normal in size.   Right Atrium: Right atrial size was normal in size.   Pericardium: A small pericardial effusion is present.   Mitral Valve: The mitral valve is normal in structure. No evidence of  mitral valve regurgitation.   Tricuspid Valve: The tricuspid valve is normal in structure.   Aortic Valve: The aortic valve is normal in structure. Aortic valve  regurgitation is not visualized.   Pulmonic Valve: The pulmonic valve was normal in structure. Pulmonic valve  regurgitation is not visualized.   Aorta: The aortic root and ascending aorta are structurally normal, with  no  evidence of dilitation.   Venous: The inferior vena cava is normal in size with greater than 50%  respiratory variability, suggesting right atrial pressure of 3 mmHg.   IAS/Shunts: No atrial level shunt detected by color flow Doppler.        Assessment & Plan   1.  Chronic diastolic CHF-weight today 308 lbs. generalized lower extremity nonpitting edema Daily weights Heart healthy low-sodium diet-salty 6 diet sheet Increase physical activity as tolerated Continue losartan , torsemide  Lower extremity support stockings  Fluid restriction  Coronary artery disease-no chest pain today.  Denies anginal type symptoms. Heart healthy low-sodium diet Continue losartan , atorvastatin , nitroglycerin  as needed  Atrial fibrillation-heart rate today 77.  Reports compliance with Eliquis .  Denies bleeding issues. Continue apixaban   Sick sinus syndrome-following with EP.  Monitoring at this time for need of PPM.  Disposition: Follow-up with Dr. Abel Hoe or me in 6-9 months.   Chet Cota. Carmine Youngberg NP-C     01/07/2024, 2:18 PM [facility] [address] Office [phone] Fax [phone]    I spent 14 minutes examining this patient, reviewing medications, and using patient centered shared decision making involving their cardiac care.   I spent  20 minutes reviewing past medical history,  medications, and prior cardiac tests.

## 2024-01-06 ENCOUNTER — Encounter (HOSPITAL_BASED_OUTPATIENT_CLINIC_OR_DEPARTMENT_OTHER): Payer: Self-pay

## 2024-01-07 ENCOUNTER — Encounter: Payer: Self-pay | Admitting: General Practice

## 2024-01-07 ENCOUNTER — Ambulatory Visit: Attending: General Practice | Admitting: General Practice

## 2024-01-07 VITALS — BP 130/60 | HR 77 | Ht 72.0 in | Wt 308.0 lb

## 2024-01-07 DIAGNOSIS — I25119 Atherosclerotic heart disease of native coronary artery with unspecified angina pectoris: Secondary | ICD-10-CM

## 2024-01-07 DIAGNOSIS — I10 Essential (primary) hypertension: Secondary | ICD-10-CM

## 2024-01-07 DIAGNOSIS — I5032 Chronic diastolic (congestive) heart failure: Secondary | ICD-10-CM | POA: Diagnosis not present

## 2024-01-07 DIAGNOSIS — I480 Paroxysmal atrial fibrillation: Secondary | ICD-10-CM

## 2024-01-07 NOTE — Patient Instructions (Addendum)
 Medication Instructions:  No medication changes were made during today's visit.   Wear compression stockings in the morning (information below on where to purchase). You may take them off in the evening time. Make sure you elevate your legs.  Limit your fluid intake .   *If you need a refill on your cardiac medications before your next appointment, please call your pharmacy*   Lab Work: No labs were ordered during today's visit.  If you have labs (blood work) drawn today and your tests are completely normal, you will receive your results only by: MyChart Message (if you have MyChart) OR A paper copy in the mail If you have any lab test that is abnormal or we need to change your treatment, we will call you to review the results.   Testing/Procedures: No procedures were ordered during today's visit.    Follow-Up: At [facility], you and your health needs are our priority.  As part of our continuing mission to provide you with exceptional heart care, we have created designated Provider Care Teams.  These Care Teams include your primary Cardiologist (physician) and Advanced Practice Providers (APPs -  Physician Assistants and Nurse Practitioners) who all work together to provide you with the care you need, when you need it.  We recommend signing up for the patient portal called "MyChart".  Sign up information is provided on this After Visit Summary.  MyChart is used to connect with patients for Virtual Visits (Telemedicine).  Patients are able to view lab/test results, encounter notes, upcoming appointments, etc.  Non-urgent messages can be sent to your provider as well.   To learn more about what you can do with MyChart, go to [email or web address].    Your next appointment:   6   Provider:   Lawana Pray, NP          Other Instructions Lawana Pray recommends you purchase some compression socks/hose from [facility] in [place] ,[place].   You do not need an  prescription to purchase the items.  Address  [address] [place], [place] [address]  Phone  [phone]   Compression   strength    8-15 mmHg 15-20 mmHg                           20-30 mmHg  30-40 mmHg.  You may also try a medical supply store, department store (i.e.- [employer], [employer], [employer], specialty shoe stores ( shoe market), [employer] and [employer]) or  [employer] uniform store.

## 2024-01-13 ENCOUNTER — Telehealth: Payer: Self-pay | Admitting: Cardiovascular Disease

## 2024-01-13 NOTE — Telephone Encounter (Signed)
 Pt c/o medication issue:  1. Name of Medication:  losartan  (COZAAR ) 25 MG tablet  2. How are you currently taking this medication (dosage and times per day)?   3. Are you having a reaction (difficulty breathing--STAT)?   4. What is your medication issue?  Patient would like to confirm he should be taking Losartan  25 MG once daily.

## 2024-01-13 NOTE — Telephone Encounter (Signed)
 Spoke with pt regarding his prescription. Per Aleene Amor, NP note on 5/6 the pt is supposed to continue taking. Pt was notified. Pt verbalized understanding. All questions if any were answered.

## 2024-01-14 ENCOUNTER — Telehealth: Payer: Self-pay | Admitting: Internal Medicine

## 2024-01-14 DIAGNOSIS — Z79891 Long term (current) use of opiate analgesic: Secondary | ICD-10-CM

## 2024-01-14 NOTE — Telephone Encounter (Signed)
 Copied from CRM [id number]. Topic: Clinical - Medication Refill >> Jan 14, 2024  2:59 PM Carrielelia G wrote: Medication: oxyCODONE -acetaminophen  (PERCOCET) 10-325 MG tablet (Due 01/23/24)  Has the patient contacted their pharmacy? No (Agent: If no, request that the patient contact the pharmacy for the refill. If patient does not wish to contact the pharmacy document the reason why and proceed with request.) (Agent: If yes, when and what did the pharmacy advise?)  [id number] Jonette Nestle, [place] - [address] AT [facility] [address] [place] [address] Phone: [phone] Fax: [phone]  Is this the correct pharmacy for this prescription? Yes If no, delete pharmacy and type the correct one.   Has the prescription been filled recently? Yes  Is the patient out of the medication? No  Has the patient been seen for an appointment in the last year OR does the patient have an upcoming appointment? Yes  Can we respond through MyChart? no  Agent: Please be advised that Rx refills may take up to 3 business days. We ask that you follow-up with your pharmacy.

## 2024-01-14 NOTE — Telephone Encounter (Signed)
 Last rx written - 11/15/23. Last OV - 12/12/23. Next OV - 02/27/24. TOX - 11/11/23.

## 2024-01-19 ENCOUNTER — Other Ambulatory Visit: Payer: Self-pay | Admitting: Student in an Organized Health Care Education/Training Program

## 2024-01-19 DIAGNOSIS — I10 Essential (primary) hypertension: Secondary | ICD-10-CM

## 2024-01-20 NOTE — Telephone Encounter (Signed)
 Medication sent to pharmacy

## 2024-01-20 NOTE — Telephone Encounter (Signed)
 Pt stated he was able to get his medication. Informed pt he has rx at [facility]. Stated [facility] wants [facility] - done.

## 2024-01-22 ENCOUNTER — Other Ambulatory Visit: Payer: Self-pay | Admitting: Student in an Organized Health Care Education/Training Program

## 2024-02-03 NOTE — Telephone Encounter (Signed)
 Patient states that he is leaving to go out of town on Thursday 02/06/2024 and is wanting to know if he could get another refill. Patient does not want to run out of medication while he is out of town and will not be returning until it is time for his appointment on 01/27/2024.  Patient states that he did pick up his prescription but does not want to run out while he is out of town. Patient would like for the medication to be sent to:  [facility] #[id number] [place], [place] - [address] AT [facility] [address] Phone: [phone]  Fax: [phone]      Please call the patient back to discuss.  [phone]

## 2024-02-04 ENCOUNTER — Other Ambulatory Visit: Payer: Self-pay | Admitting: Internal Medicine

## 2024-02-04 DIAGNOSIS — E119 Type 2 diabetes mellitus without complications: Secondary | ICD-10-CM | POA: Diagnosis not present

## 2024-02-04 DIAGNOSIS — H43391 Other vitreous opacities, right eye: Secondary | ICD-10-CM | POA: Diagnosis not present

## 2024-02-04 DIAGNOSIS — H33011 Retinal detachment with single break, right eye: Secondary | ICD-10-CM | POA: Diagnosis not present

## 2024-02-04 DIAGNOSIS — H43813 Vitreous degeneration, bilateral: Secondary | ICD-10-CM | POA: Diagnosis not present

## 2024-02-04 NOTE — Telephone Encounter (Signed)
 Called twice and left vms this afternoon on his home/cell#. Will try again in the morning.

## 2024-02-05 ENCOUNTER — Telehealth: Payer: Self-pay | Admitting: *Deleted

## 2024-02-05 ENCOUNTER — Other Ambulatory Visit: Payer: Self-pay | Admitting: Internal Medicine

## 2024-02-05 MED ORDER — OXYCODONE-ACETAMINOPHEN 10-325 MG PO TABS: 1.0000 | ORAL_TABLET | Freq: Four times a day (QID) | ORAL | 0 refills | Status: DC | PRN

## 2024-02-05 NOTE — Telephone Encounter (Signed)
 Spoke to [facility] who stated that the Pharmacy did not receive the prescription for the Oxycodone  .  Patient said that he never pick up the prescription for that medication at that location either

## 2024-02-05 NOTE — Telephone Encounter (Signed)
 Call from patient requesting a refill for his Oxycodone -Acetaminophen .  Patient [facility] up [facility] [address] would like to pick up next refill on 6/14 as he will be leaving town.   Would like to get refill sent to the [facility] on [address].  Call to [facility] to ask about prescriptions sent ton 01/10/2024.  Never received the prescriptions.

## 2024-02-06 DIAGNOSIS — H33021 Retinal detachment with multiple breaks, right eye: Secondary | ICD-10-CM | POA: Diagnosis not present

## 2024-02-12 ENCOUNTER — Other Ambulatory Visit: Payer: Self-pay | Admitting: Physician Assistant

## 2024-02-12 DIAGNOSIS — I480 Paroxysmal atrial fibrillation: Secondary | ICD-10-CM

## 2024-02-14 DIAGNOSIS — H33011 Retinal detachment with single break, right eye: Secondary | ICD-10-CM | POA: Diagnosis not present

## 2024-02-20 ENCOUNTER — Other Ambulatory Visit: Payer: Self-pay | Admitting: Student in an Organized Health Care Education/Training Program

## 2024-02-20 DIAGNOSIS — G609 Hereditary and idiopathic neuropathy, unspecified: Secondary | ICD-10-CM

## 2024-02-23 ENCOUNTER — Other Ambulatory Visit: Payer: Self-pay | Admitting: Student in an Organized Health Care Education/Training Program

## 2024-02-23 DIAGNOSIS — G609 Hereditary and idiopathic neuropathy, unspecified: Secondary | ICD-10-CM

## 2024-02-27 ENCOUNTER — Encounter: Payer: Self-pay | Admitting: Internal Medicine

## 2024-02-27 ENCOUNTER — Ambulatory Visit: Payer: Self-pay | Admitting: Internal Medicine

## 2024-02-27 VITALS — BP 146/82 | HR 53 | Temp 98.0°F | Ht 73.0 in | Wt 303.0 lb

## 2024-02-27 DIAGNOSIS — I1310 Hypertensive heart and chronic kidney disease without heart failure, with stage 1 through stage 4 chronic kidney disease, or unspecified chronic kidney disease: Secondary | ICD-10-CM

## 2024-02-27 DIAGNOSIS — N183 Chronic kidney disease, stage 3 unspecified: Secondary | ICD-10-CM | POA: Diagnosis not present

## 2024-02-27 DIAGNOSIS — E1140 Type 2 diabetes mellitus with diabetic neuropathy, unspecified: Secondary | ICD-10-CM | POA: Diagnosis not present

## 2024-02-27 DIAGNOSIS — I5032 Chronic diastolic (congestive) heart failure: Secondary | ICD-10-CM

## 2024-02-27 DIAGNOSIS — Z7984 Long term (current) use of oral hypoglycemic drugs: Secondary | ICD-10-CM | POA: Diagnosis not present

## 2024-02-27 DIAGNOSIS — J4489 Other specified chronic obstructive pulmonary disease: Secondary | ICD-10-CM | POA: Diagnosis not present

## 2024-02-27 DIAGNOSIS — Z79891 Long term (current) use of opiate analgesic: Secondary | ICD-10-CM | POA: Diagnosis not present

## 2024-02-27 DIAGNOSIS — E1122 Type 2 diabetes mellitus with diabetic chronic kidney disease: Secondary | ICD-10-CM

## 2024-02-27 DIAGNOSIS — N521 Erectile dysfunction due to diseases classified elsewhere: Secondary | ICD-10-CM

## 2024-02-27 DIAGNOSIS — N1831 Chronic kidney disease, stage 3a: Secondary | ICD-10-CM

## 2024-02-27 DIAGNOSIS — I10 Essential (primary) hypertension: Secondary | ICD-10-CM

## 2024-02-27 DIAGNOSIS — Z6839 Body mass index (BMI) 39.0-39.9, adult: Secondary | ICD-10-CM

## 2024-02-27 MED ORDER — ALBUTEROL SULFATE HFA 108 (90 BASE) MCG/ACT IN AERS: 1.0000 | INHALATION_SPRAY | Freq: Four times a day (QID) | RESPIRATORY_TRACT | 5 refills | Status: AC | PRN

## 2024-02-27 MED ORDER — LOSARTAN POTASSIUM 50 MG PO TABS: 50.0000 mg | ORAL_TABLET | Freq: Every day | ORAL | 3 refills | Status: AC

## 2024-02-27 MED ORDER — OXYCODONE-ACETAMINOPHEN 10-325 MG PO TABS: 1.0000 | ORAL_TABLET | Freq: Four times a day (QID) | ORAL | 0 refills | Status: AC | PRN

## 2024-02-27 MED ORDER — OXYCODONE-ACETAMINOPHEN 10-325 MG PO TABS: 1.0000 | ORAL_TABLET | Freq: Four times a day (QID) | ORAL | 0 refills | Status: DC | PRN

## 2024-02-27 MED ORDER — TERAZOSIN HCL 5 MG PO CAPS: 5.0000 mg | ORAL_CAPSULE | Freq: Every day | ORAL | 3 refills | Status: AC

## 2024-02-27 NOTE — Assessment & Plan Note (Signed)
Well controlled continue metformin 

## 2024-02-27 NOTE — Assessment & Plan Note (Signed)
 WIll increase Losartan  back to 50mg  today.  Continue terazosin .

## 2024-02-27 NOTE — Assessment & Plan Note (Signed)
 SCr stable at last check.

## 2024-02-27 NOTE — Progress Notes (Signed)
 Established Patient Office Visit  Subjective   Patient ID: Juan Calderon, male    DOB: August 16, 1956  Age: 68 y.o. MRN: [id number]  Chief Complaint  Patient presents with   Follow-up  Here for follow up for DM, HTN, chronic pain Saw Dr Loras on 3/4 was referred to Dr Jarold at [facility] and seen on 3/5 for surgery for torn retina. Reports gas bubble still there and has appointment for follow up on July 11th.  For his diabetes, doing well with metformin , no issues, last A1c 6.8 on metformin .  For HTN, bp elevated today, a few months ago he was reduced from 50mg  losartan  to 25mg .  No orthostatic symptoms.      Objective:     BP (!) 146/82 (BP Location: Right Arm, Patient Position: Sitting, Cuff Size: Large)   Pulse (!) 53   Temp 98 F (36.7 C) (Oral)   Ht 6' 1 (1.854 m)   Wt (!) 303 lb (137.4 kg)   SpO2 98% Comment: RA  BMI 39.98 kg/m  BP Readings from Last 3 Encounters:  02/27/24 (!) 146/82  01/07/24 130/60  12/12/23 136/69   Wt Readings from Last 3 Encounters:  02/27/24 (!) 303 lb (137.4 kg)  01/07/24 (!) 308 lb (139.7 kg)  12/12/23 (!) 309 lb 3.2 oz (140.3 kg)      Physical Exam  Cardiovascular:     Rate and Rhythm: Normal rate and regular rhythm.  Pulmonary:     Effort: Pulmonary effort is normal.     Breath sounds: Normal breath sounds.   Musculoskeletal:     Right lower leg: Edema present.     Left lower leg: Edema present.      No results found for any visits on 02/27/24.  Last metabolic panel Lab Results  Component Value Date   GLUCOSE 142 (H) 11/11/2023   NA 141 11/11/2023   K 4.0 11/11/2023   CL 98 11/11/2023   CO2 25 11/11/2023   BUN 28 (H) 11/11/2023   CREATININE 1.85 (H) 11/11/2023   EGFR 39 (L) 11/11/2023   CALCIUM  9.7 11/11/2023   PHOS 3.5 03/01/2015   PROT 6.7 05/13/2023   ALBUMIN 4.2 05/13/2023   LABGLOB 2.5 05/13/2023   AGRATIO 1.5 04/06/2016   BILITOT 0.4 05/13/2023   ALKPHOS 88 05/13/2023   AST 13 05/13/2023    ALT 15 05/13/2023   ANIONGAP 9 07/03/2022      The ASCVD Risk score (Arnett DK, et al., 2019) failed to calculate for the following reasons:   The valid total cholesterol range is 130 to 320 mg/dL    Assessment & Plan:   Problem List Items Addressed This Visit       Cardiovascular and Mediastinum   Essential hypertension (Chronic)   WIll increase Losartan  back to 50mg  today.  Continue terazosin .      Relevant Medications   terazosin  (HYTRIN ) 5 MG capsule   losartan  (COZAAR ) 50 MG tablet   (HFpEF) heart failure with preserved ejection fraction (HCC) - Primary (Chronic)   Has some chronic LE edema but suspect that is more due to venous insufficency otherwise appears euvolemic.  Continue torsemide  20mg  daily      Relevant Medications   terazosin  (HYTRIN ) 5 MG capsule   losartan  (COZAAR ) 50 MG tablet     Endocrine   Type II diabetes mellitus with neuropathy causing erectile dysfunction (HCC) (Chronic)   Well controlled continue metformin .      Relevant Medications   losartan  (COZAAR )  50 MG tablet     Genitourinary   Chronic kidney disease (CKD), stage III (moderate) (HCC) (Chronic)   SCr stable at last check.        Other   Severe obesity with body mass index (BMI) of 35.0 to 39.9 with comorbidity (HCC) (Chronic)   BMI is 39.98 with HTN, DM, HLD, OSA, GERD and OA.  Advised weight loss.      Chronic use of opiate drug for therapeutic purpose (Chronic)   Reviewed PDMP appropriate. Last U tox 11/2023 appropriate.  Reports continue to have relief of chronic left leg pain as well as neck and knee pain with oxycodone .  Refilled..       Other Visit Diagnoses       Chronic asthmatic bronchitis (HCC)  (Chronic)      Relevant Medications   albuterol  (VENTOLIN  HFA) 108 (90 Base) MCG/ACT inhaler     Morbid obesity (HCC)   (Chronic)         Return in about 3 months (around 05/29/2024).    Dayton JAYSON Eastern, DO

## 2024-02-27 NOTE — Assessment & Plan Note (Signed)
 Has some chronic LE edema but suspect that is more due to venous insufficency otherwise appears euvolemic.  Continue torsemide  20mg  daily

## 2024-02-27 NOTE — Assessment & Plan Note (Signed)
 BMI is 39.98 with HTN, DM, HLD, OSA, GERD and OA.  Advised weight loss.

## 2024-02-27 NOTE — Assessment & Plan Note (Signed)
 Reviewed PDMP appropriate. Last U tox 11/2023 appropriate.  Reports continue to have relief of chronic left leg pain as well as neck and knee pain with oxycodone .  Refilled.SABRA

## 2024-03-03 ENCOUNTER — Telehealth: Payer: Self-pay | Admitting: *Deleted

## 2024-03-03 DIAGNOSIS — M1A00X0 Idiopathic chronic gout, unspecified site, without tophus (tophi): Secondary | ICD-10-CM

## 2024-03-03 MED ORDER — ALLOPURINOL 300 MG PO TABS: 300.0000 mg | ORAL_TABLET | Freq: Every day | ORAL | 3 refills | Status: AC

## 2024-03-03 NOTE — Telephone Encounter (Signed)
 Called, sounds like he just needed a refill.

## 2024-03-03 NOTE — Telephone Encounter (Signed)
 Copied from CRM [id number]. Topic: Clinical - Prescription Issue >> Mar 03, 2024  2:16 PM Carrielelia G wrote: Patient calling because the pharmacy is having an issue with filling the the RX: allopurinol  (ZYLOPRIM ) 300 MG tablet.   Please advise.

## 2024-03-24 ENCOUNTER — Other Ambulatory Visit: Payer: Self-pay

## 2024-03-24 DIAGNOSIS — I25119 Atherosclerotic heart disease of native coronary artery with unspecified angina pectoris: Secondary | ICD-10-CM

## 2024-03-24 MED ORDER — ATORVASTATIN CALCIUM 20 MG PO TABS
20.0000 mg | ORAL_TABLET | Freq: Every day | ORAL | 3 refills | Status: AC
Start: 2024-03-24 — End: ?

## 2024-03-24 NOTE — Telephone Encounter (Signed)
 Unable to sch pt's September appt with PCP Dr. Rosan until the the sch is posted.  Copied from CRM #[id number]. Topic: Appointments - Scheduling Inquiry for Clinic >> Mar 24, 2024  3:27 PM Adrianna P wrote: Reason for CRM: Patient called to schedule for End of September but schedule was not open yet for that far out   ----------------------------------------------------------------------- From previous Reason for Contact - Scheduling: Patient/patient representative is calling to schedule an appointment. Refer to attachments for appointment information.

## 2024-04-17 ENCOUNTER — Other Ambulatory Visit: Payer: Self-pay | Admitting: Internal Medicine

## 2024-04-17 NOTE — Telephone Encounter (Unsigned)
 Copied from CRM #[id number]. Topic: Clinical - Medication Refill >> Apr 17, 2024 11:01 AM Miquel SAILOR wrote: Medication: oxyCODONE-acetaminophen (PERCOCET) 10-325 MG tablet (Starting on 05/15/2024)   Has the patient contacted their pharmacy? Yes (Agent: If no, request that the patient contact the pharmacy for the refill. If patient does not wish to contact the pharmacy document the reason why and proceed with request.) (Agent: If yes, when and what did the pharmacy advise?)  This is the patient's preferred pharmacy:  [facility] #[id number] [place], [place] - [address] AT [facility] [address] [place] [address] Phone: [phone] Fax: [phone]   Is this the correct pharmacy for this prescription? Yes If no, delete pharmacy and type the correct one.   Has the prescription been filled recently? Yes  Is the patient out of the medication? No 2 pills left  Has the patient been seen for an appointment in the last year OR does the patient have an upcoming appointment? Yes  Can we respond through MyChart? Yes  Agent: Please be advised that Rx refills may take up to 3 business days. We ask that you follow-up with your pharmacy.

## 2024-04-21 NOTE — Telephone Encounter (Signed)
 As noted already filled

## 2024-04-22 ENCOUNTER — Other Ambulatory Visit: Payer: Self-pay | Admitting: Physician Assistant

## 2024-04-22 NOTE — Telephone Encounter (Signed)
 Error

## 2024-05-05 ENCOUNTER — Other Ambulatory Visit: Payer: Self-pay | Admitting: Internal Medicine

## 2024-05-05 ENCOUNTER — Ambulatory Visit: Payer: Self-pay

## 2024-05-05 DIAGNOSIS — E1140 Type 2 diabetes mellitus with diabetic neuropathy, unspecified: Secondary | ICD-10-CM

## 2024-05-05 DIAGNOSIS — I5032 Chronic diastolic (congestive) heart failure: Secondary | ICD-10-CM

## 2024-05-05 NOTE — Telephone Encounter (Unsigned)
 Copied from CRM #[id number]. Topic: Clinical - Medication Refill >> May 05, 2024  8:54 AM Laurier C wrote: Medication:  metFORMIN  (GLUCOPHAGE ) 1000 MG tablet torsemide  (DEMADEX ) 20 MG tablet   Has the patient contacted their pharmacy? Yes   This is the patient's preferred pharmacy:   [id number] [place], [place] - [address] AT [facility] [address] [place] [address] Phone: [phone] Fax: [phone]  Is this the correct pharmacy for this prescription? Yes If no, delete pharmacy and type the correct one.   Has the prescription been filled recently? No  Is the patient out of the medication? Yes  Has the patient been seen for an appointment in the last year OR does the patient have an upcoming appointment? Yes  Can we respond through MyChart? Yes  Agent: Please be advised that Rx refills may take up to 3 business days. We ask that you follow-up with your pharmacy.

## 2024-05-05 NOTE — Telephone Encounter (Signed)
 FYI Only or Action Required?: Action required by provider: request for appointment.  Patient was last seen in primary care on 02/27/2024 by Rosan Dayton BROCKS, DO.  Called Nurse Triage reporting Cough.  Symptoms began a week ago.  Interventions attempted: Rest, hydration, or home remedies.  Symptoms are: unchanged.  Triage Disposition: See PCP When Office is Open (Within 3 Days)  Patient/caregiver understands and will follow disposition?: Yes, will follow disposition  Summary: cough   Patient states he has had a persistent cough for a few weeks now and has not gotten better. Patient states he has taken over the counter meds and wants to know if something else could be sent to the pharmacy.         Reason for Disposition  [1] Nasal discharge AND [2] present > 10 days  Answer Assessment - Initial Assessment Questions 1. ONSET: When did the cough begin?      About 2 weeks 2. SEVERITY: How bad is the cough today?      mild 3. SPUTUM: Describe the color of your sputum (e.g., none, dry cough; clear, white, yellow, green)     white 4. HEMOPTYSIS: Are you coughing up any blood? If Yes, ask: How much? (e.g., flecks, streaks, tablespoons, etc.)     denies 5. DIFFICULTY BREATHING: Are you having difficulty breathing? If Yes, ask: How bad is it? (e.g., mild, moderate, severe)      denies 6. FEVER: Do you have a fever? If Yes, ask: What is your temperature, how was it measured, and when did it start?     denies 7. CARDIAC HISTORY: Do you have any history of heart disease? (e.g., heart attack, congestive heart failure)      afib 8. LUNG HISTORY: Do you have any history of lung disease?  (e.g., pulmonary embolus, asthma, emphysema)     asthma 10. OTHER SYMPTOMS: Do you have any other symptoms? (e.g., runny nose, wheezing, chest pain)       Runny nose, wheezing at noc 12. TRAVEL: Have you traveled out of the country in the last month? (e.g., travel history,  exposures)       Denies  Routing to clinic for scheduling.  Protocols used: Cough - Acute Productive-A-AH

## 2024-05-05 NOTE — Telephone Encounter (Signed)
 Called pt to schedule an appt - no answer; left message of office's return call and to call the office.

## 2024-05-06 MED ORDER — METFORMIN HCL 1000 MG PO TABS: 1000.0000 mg | ORAL_TABLET | Freq: Two times a day (BID) | ORAL | 3 refills | Status: AC

## 2024-05-06 MED ORDER — TORSEMIDE 20 MG PO TABS: 20.0000 mg | ORAL_TABLET | Freq: Every day | ORAL | 3 refills | Status: AC

## 2024-05-06 NOTE — Telephone Encounter (Signed)
 Pt has an appt on Tuesday 05/12/24.

## 2024-05-11 ENCOUNTER — Encounter: Admitting: Student

## 2024-05-12 ENCOUNTER — Ambulatory Visit (INDEPENDENT_AMBULATORY_CARE_PROVIDER_SITE_OTHER): Admitting: Student

## 2024-05-12 ENCOUNTER — Other Ambulatory Visit: Payer: Self-pay

## 2024-05-12 VITALS — BP 121/76 | HR 47 | Temp 98.3°F | Ht 73.0 in | Wt 298.4 lb

## 2024-05-12 DIAGNOSIS — E1140 Type 2 diabetes mellitus with diabetic neuropathy, unspecified: Secondary | ICD-10-CM

## 2024-05-12 DIAGNOSIS — J069 Acute upper respiratory infection, unspecified: Secondary | ICD-10-CM | POA: Diagnosis not present

## 2024-05-12 DIAGNOSIS — N521 Erectile dysfunction due to diseases classified elsewhere: Secondary | ICD-10-CM | POA: Diagnosis not present

## 2024-05-12 LAB — POCT GLYCOSYLATED HEMOGLOBIN (HGB A1C): HbA1c, POC (controlled diabetic range): 6.8 % (ref 0.0–7.0)

## 2024-05-12 LAB — GLUCOSE, CAPILLARY: Glucose-Capillary: 112 mg/dL — ABNORMAL HIGH (ref 70–99)

## 2024-05-12 MED ORDER — BENZONATATE 100 MG PO CAPS: 100.0000 mg | ORAL_CAPSULE | Freq: Four times a day (QID) | ORAL | 0 refills | Status: AC | PRN

## 2024-05-12 MED ORDER — FLUTICASONE PROPIONATE 50 MCG/ACT NA SUSP: 1.0000 | Freq: Every day | NASAL | 3 refills | Status: DC

## 2024-05-12 NOTE — Patient Instructions (Signed)
 Thank you, [name] for allowing us  to provide your care today. Today we discussed you upper respiratory infection.    Please try to use tessalon  pearls, flonase  (for the nasal congestion), and purchase corcidin HBP cough medicine over the counter.  I have ordered the following labs for you:   Lab Orders         Glucose, capillary         POC Hbg A1C        I have ordered the following medication/changed the following medications:   Stop the following medications: There are no discontinued medications.   Start the following medications: Meds ordered this encounter  Medications   fluticasone  (FLONASE ) 50 MCG/ACT nasal spray    Sig: Place 1 spray into both nostrils daily.    Dispense:  9.9 mL    Refill:  3   benzonatate  (TESSALON  PERLES) 100 MG capsule    Sig: Take 1 capsule (100 mg total) by mouth every 6 (six) hours as needed for cough.    Dispense:  30 capsule    Refill:  0     Follow up: Please return for your visit in the near future     Remember: Call if you have worsening shortness of breath or if you are unable to catch your breath at rest.   Should you have any questions or concerns please call the internal medicine clinic at [phone].     Please note that our late policy has changed.  If you are more than 15 minutes late to your appointment, you may be asked to reschedule your appointment.  Dr. Kandis, D.O. [facility]

## 2024-05-12 NOTE — Progress Notes (Unsigned)
 Established Patient Office Visit  Subjective   Patient ID: Juan Calderon, male    DOB: 04/26/1956  Age: 68 y.o. MRN: [id number]  Chief Complaint  Patient presents with  . Follow-up    Patient states been having this cold for about 3 weeks / productive cough-clear in color / medication refill    Juan Calderon is a 68 y.o. who presents to the clinic for a cough. Please see problem based assessment and plan for additional details.   Cough, acute  -when: 3 weeks prior  -SOB: No change from baseline  -Wheezing:Yes -productive? Y  -congestion?Y, draining  -F/C?None  -sinus issues? None  -Has allergies, not normal sx -No sick contacts  -Using albuterol  BID 2 puffs, not using prior: for the wheezing  O2- 93% rest Need to ambulate him:  Meds: Tried cold meds over the counter   Exam:  Lungs CLTAB No wheezes No sore throat  Volume**** fine   Plan: -Corcidin  -Tessalon  pearls  -  Per pulm: Chronic dyspnea on exertion for several years, likely multifactoial.  He has obesity related restriction.  He also has chronic HFpEF contributing as well as untreated OSA.  Based on dilated PA on CT scan, I suspect he has PH.  He does not have ILD or COPD   Needs pain meds refilled   Patient Active Problem List   Diagnosis Date Noted  . Venous insufficiency 10/16/2023  . Left foot drop 05/13/2023  . Dysequilibrium 02/04/2023  . Ascending aorta dilation (HCC) 02/04/2023  . Housing insecurity 01/22/2022  . Chronic kidney disease (CKD), stage III (moderate) (HCC) 10/17/2021  . Rotator cuff tendinitis, left 07/24/2021  . Erectile dysfunction 01/23/2021  . B12 deficiency 10/24/2020  . Tubular adenoma of colon 11/22/2017  . Chronic use of opiate drug for therapeutic purpose 08/23/2017  . Hypomagnesemia 12/28/2016  . C6 radiculopathy 01/24/2016  . Paroxysmal atrial fibrillation (HCC) 10/25/2015  . Constipation due to opioid therapy 12/24/2014  . Post-traumatic osteoarthritis of left  knee 06/19/2013  . Obstructive sleep apnea 06/01/2013  . Osteoarthritis cervical spine 04/25/2013  . Gastroesophageal reflux disease without esophagitis 04/25/2013  . Open-angle glaucoma 04/25/2013  . Hyperlipidemia 04/25/2013  . Type II diabetes mellitus with neuropathy causing erectile dysfunction (HCC) 04/25/2013  . Coronary artery disease involving native coronary artery with angina pectoris (HCC) 04/25/2013  . COPD (chronic obstructive pulmonary disease) (HCC) 04/25/2013  . Idiopathic chronic gout without tophus 04/25/2013  . Severe obesity with body mass index (BMI) of 35.0 to 39.9 with comorbidity (HCC) 04/25/2013  . Healthcare maintenance 01/15/2013  . (HFpEF) heart failure with preserved ejection fraction (HCC) 02/04/2012  . Essential hypertension 09/20/2011      Objective:     BP (!) 175/90 (BP Location: Left Arm, Patient Position: Sitting, Cuff Size: Normal)   Pulse (!) 55   Temp 98.3 F (36.8 C) (Oral)   Ht 6' 1 (1.854 m)   Wt 298 lb 6.4 oz (135.4 kg)   SpO2 93%   BMI 39.37 kg/m  BP Readings from Last 3 Encounters:  05/12/24 (!) 175/90  02/27/24 (!) 146/82  01/07/24 130/60   Wt Readings from Last 3 Encounters:  05/12/24 298 lb 6.4 oz (135.4 kg)  02/27/24 (!) 303 lb (137.4 kg)  01/07/24 (!) 308 lb (139.7 kg)      Physical Exam   Results for orders placed or performed in visit on 05/12/24  Glucose, capillary  Result Value Ref Range   Glucose-Capillary 112 (H) 70 -  99 mg/dL  POC Hbg J8R  Result Value Ref Range   Hemoglobin A1C     HbA1c POC (<> result, manual entry)     HbA1c, POC (prediabetic range)     HbA1c, POC (controlled diabetic range) 6.8 0.0 - 7.0 %    Last hemoglobin A1c Lab Results  Component Value Date   HGBA1C 6.8 05/12/2024      The ASCVD Risk score (Arnett DK, et al., 2019) failed to calculate for the following reasons:   The valid total cholesterol range is 130 to 320 mg/dL    Assessment & Plan:   Problem List Items  Addressed This Visit       Endocrine   Type II diabetes mellitus with neuropathy causing erectile dysfunction (HCC) - Primary (Chronic)   Relevant Orders   POC Hbg A1C (Completed)    No follow-ups on file.    Damien Lease, DO

## 2024-05-13 ENCOUNTER — Ambulatory Visit: Payer: Self-pay

## 2024-05-13 ENCOUNTER — Other Ambulatory Visit: Payer: Self-pay | Admitting: Internal Medicine

## 2024-05-13 NOTE — Telephone Encounter (Signed)
 FYI Only or Action Required?: Action required by provider: clinical question for provider.  Patient was last seen in primary care on 05/12/2024 by Kandis Perkins, DO.   Summary: taking two meds at the same time question   Please call patient he would like to know if he can  take the medications: benzonatate  (TESSALON  PERLES) 100 MG capsul and Smedication at the same time.       Nurse will route this encounter to PCP office for review and follow up with patient.

## 2024-05-13 NOTE — Telephone Encounter (Signed)
 Not sure what the s medication is, will forward over to dr bender since she just saw him

## 2024-05-13 NOTE — Telephone Encounter (Signed)
 FYI Only or Action Required?: Action required by provider: medication refill request.  Patient was last seen in primary care on 05/12/2024 by Kandis Perkins, DO.  Called Nurse Triage reporting No chief complaint on file..  Symptoms began today.  Interventions attempted: Nothing.  Symptoms are: stable.  Triage Disposition: No disposition on file.  Patient/caregiver understands and will follow disposition?:

## 2024-05-13 NOTE — Telephone Encounter (Signed)
 Looks like I sent in a 3rd script for fill on 9/12 back when I last saw him, he should probably call the pharmacy first to see if they have that script.

## 2024-05-13 NOTE — Assessment & Plan Note (Signed)
 Patient presents to the clinic to discuss a productive cough has persisted for 3 weeks.  He stated that about 3 weeks ago he woke up with a cough and nasal congestion.  His baseline shortness of breath with exertion has not changed, he denies shortness of breath at rest.  He does have wheezing.  He denies fevers, chills, sinus pain, ear fullness, recent sick contacts.  He does have allergies; however, his allergy do not present this way.  Since he developed this cough, he has been using his albuterol  twice a day for wheezing.  He has tried numerous cold medications over-the-counter but does not recall which ones.  On exam, his oxygen  was 93% at rest, oxygen  remained above 92% with ambulation.  His lungs were clear to auscultate bilaterally, I did not appreciate expiratory or inspiratory wheezes.,  There is no erythema of the posterior oropharynx, he does not have rales or peripheral edema suggesting volume overload at this time.  I was unable to assess JVD due to body habitus. Overall, I suspect that this patient has an upper respiratory virus.  Do not suspect that patient has pneumonia at this time, as he is not hypoxic or afebrile.  Patient does not have underlying COPD per pulmonology; therefore low suspicion for obstructive lung disease exacerbation at this time. Plan: Will continue to treat him supportively.  I did prescribe Tessalon  Perles, Flonase  and recommended that he purchases Coricidin HBP over-the-counter. Patient has a follow-up with his primary care physician in a few weeks, will assess if cough is resolved by then.

## 2024-05-13 NOTE — Progress Notes (Signed)
 Internal Medicine Clinic Attending  Case discussed with the resident at the time of the visit.  We reviewed the resident's history and exam and pertinent patient test results.  I agree with the assessment, diagnosis, and plan of care documented in the resident's note.

## 2024-05-13 NOTE — Telephone Encounter (Unsigned)
 Copied from CRM [id number]. Topic: Clinical - Medication Refill >> May 13, 2024  2:01 PM Carrielelia G wrote: Medication: oxyCODONE -acetaminophen  (PERCOCET) 10-325 MG tablet  Has the patient contacted their pharmacy? No (Agent: If no, request that the patient contact the pharmacy for the refill. If patient does not wish to contact the pharmacy document the reason why and proceed with request.) (Agent: If yes, when and what did the pharmacy advise?)  This is the patient's preferred pharmacy:  [facility] #[id number] [place] [place], [place] - [address] AT [facility] [address] [place] [address] Phone: [phone] Fax: [phone]  CIs this the correct pharmacy for this prescription? Yes If no, delete pharmacy and type the correct one.     Is the patient out of the medication? No  Has the patient been seen for an appointment in the last year OR does the patient have an upcoming appointment? Yes  Can we respond through MyChart? no  Agent: Please be advised that Rx refills may take up to 3 business days. We ask that you follow-up with your pharmacy.

## 2024-05-18 ENCOUNTER — Telehealth: Payer: Self-pay | Admitting: *Deleted

## 2024-05-18 NOTE — Telephone Encounter (Signed)
 Forwarded message to [employer]. Per request below:  Copied from CRM [id number]. Topic: General - Other >> May 18, 2024 12:19 PM Alfonso ORN wrote: Reason for CRM: patient returning [place] call please contact pt. Back

## 2024-05-18 NOTE — Telephone Encounter (Signed)
 Reviewed notes, Cardiology discontinued on 10/04/23 due to bradycardia.  Would decline refill.

## 2024-05-18 NOTE — Telephone Encounter (Addendum)
 Received refill request for metoprolol  tartrate 25 mg tabs take one tablet by mouth twice daily  Last filled 02/28/2024 #[id number]  ordering prescriber Dr Forest  Medication no longer on medication list CMA attempted to contact patient for further clarification  No answer  Message left on recorder for return call .Trenna Kiely Cassady9/15/202511:38 AM  Will send to pcp for further review

## 2024-05-19 NOTE — Telephone Encounter (Signed)
 Contacted pt to inform him that metoprolol  refill has been denied at this time as it was discontinued by cardiologist by in January 2025.  Pt to check with cardiologist as he thought he was a reduced dose.Juan Dess Cassady9/16/20254:02 PM

## 2024-05-25 ENCOUNTER — Ambulatory Visit (INDEPENDENT_AMBULATORY_CARE_PROVIDER_SITE_OTHER): Payer: Self-pay | Admitting: Internal Medicine

## 2024-05-25 ENCOUNTER — Encounter: Payer: Self-pay | Admitting: Internal Medicine

## 2024-05-25 VITALS — BP 113/71 | HR 78 | Temp 98.2°F | Ht 73.0 in | Wt 300.6 lb

## 2024-05-25 DIAGNOSIS — R052 Subacute cough: Secondary | ICD-10-CM

## 2024-05-25 DIAGNOSIS — I110 Hypertensive heart disease with heart failure: Secondary | ICD-10-CM

## 2024-05-25 DIAGNOSIS — I5032 Chronic diastolic (congestive) heart failure: Secondary | ICD-10-CM | POA: Diagnosis not present

## 2024-05-25 DIAGNOSIS — Z79891 Long term (current) use of opiate analgesic: Secondary | ICD-10-CM

## 2024-05-25 DIAGNOSIS — N521 Erectile dysfunction due to diseases classified elsewhere: Secondary | ICD-10-CM

## 2024-05-25 DIAGNOSIS — I480 Paroxysmal atrial fibrillation: Secondary | ICD-10-CM

## 2024-05-25 DIAGNOSIS — E1140 Type 2 diabetes mellitus with diabetic neuropathy, unspecified: Secondary | ICD-10-CM | POA: Diagnosis not present

## 2024-05-25 DIAGNOSIS — Z7984 Long term (current) use of oral hypoglycemic drugs: Secondary | ICD-10-CM

## 2024-05-25 DIAGNOSIS — I10 Essential (primary) hypertension: Secondary | ICD-10-CM

## 2024-05-25 MED ORDER — GUAIFENESIN-CODEINE 100-10 MG/5ML PO SOLN: 5.0000 mL | Freq: Four times a day (QID) | ORAL | 1 refills | Status: AC | PRN

## 2024-05-25 MED ORDER — OXYCODONE-ACETAMINOPHEN 10-325 MG PO TABS: 1.0000 | ORAL_TABLET | Freq: Four times a day (QID) | ORAL | 0 refills | Status: AC | PRN

## 2024-05-25 MED ORDER — GABAPENTIN 300 MG PO CAPS: 300.0000 mg | ORAL_CAPSULE | Freq: Three times a day (TID) | ORAL | 2 refills | Status: DC

## 2024-05-25 MED ORDER — OXYCODONE-ACETAMINOPHEN 10-325 MG PO TABS: 1.0000 | ORAL_TABLET | Freq: Four times a day (QID) | ORAL | 0 refills | Status: DC | PRN

## 2024-05-25 NOTE — Assessment & Plan Note (Signed)
 Suspect post viral. Will give short course of Robitussin AC.

## 2024-05-25 NOTE — Assessment & Plan Note (Signed)
 Remains of eliquis ,  Antiarythemics managed by cardiology (flecainide )

## 2024-05-25 NOTE — Assessment & Plan Note (Signed)
 A1c well controlled. Having some painful neuropahty, gabapentin  previously helped. Will restart at 300mg  TID,  Continue metformin , if A1c becomes elevated consider adding jardiance given HFpEF

## 2024-05-25 NOTE — Progress Notes (Signed)
 Subjective:  HPI: Chief Complaint  Patient presents with   Follow-up    Continue cough - intermittent white phlegm; wheezing at night.    Discussed the use of AI scribe software for clinical note transcription with the patient, who gave verbal consent to proceed.  History of Present Illness Juan Calderon is a 68 year old male who presents with a persistent cough and wheezing.  He has been experiencing a persistent cough for approximately three weeks, initially noted during a visit two weeks ago. The cough is associated with wheezing, particularly at night. There is some improvement in symptoms, but the cough and wheezing persist. He uses Flonase  nasal spray, which helps keep his nasal passages open, although nasal drainage continues. No fever or chills.  He experiences sharp headaches that come and go, lasting less than 30 minutes, occurring on both sides of his head. These headaches are described as sharp pains and are associated with his cough.  He has a history of neuropathy, for which he was prescribed medication, but it is not effective. The neuropathy symptoms affect either foot, are worse at night, and occur intermittently.  He uses an albuterol  inhaler with a red top and has previously used a codeine -containing cough syrup, which was effective in the past. However, the current cough suppressant medication, described as 'little yellow balls,' is not helpful. He has tried various treatments over the past month and a half, with limited success.  He reports a slight increase in weight, currently at 300 pounds, up from 298 pounds. He typically eats one meal a day and snacks on cookies and milk, which may contribute to his weight maintenance.     Please see Assessment and Plan below for the status of his chronic medical problems.  Objective:  Physical Exam: Vitals:   05/25/24 1102 05/25/24 1137  BP: (!) 141/80 113/71  Pulse: 75 78  Temp: 98.2 F (36.8 C)   TempSrc: Oral    SpO2: 98%   Weight: (!) 300 lb 9.6 oz (136.4 kg)   Height: 6' 1 (1.854 m)    Body mass index is 39.66 kg/m. Physical Exam Constitutional:      Appearance: Normal appearance. He is obese.  HENT:     Nose: No congestion.     Comments: Mild erythema of nasal turbinates    Mouth/Throat:     Mouth: Mucous membranes are moist.     Pharynx: Oropharynx is clear.  Cardiovascular:     Rate and Rhythm: Normal rate and regular rhythm.  Pulmonary:     Effort: Pulmonary effort is normal.     Breath sounds: Normal breath sounds.  Neurological:     Mental Status: He is alert.    Results  No results found for any visits on 05/25/24.  The ASCVD Risk score (Arnett DK, et al., 2019) failed to calculate for the following reasons:   The valid total cholesterol range is 130 to 320 mg/dL  Assessment & Plan:  See Encounters Tab for problem based charting. Problem List Items Addressed This Visit       Cardiovascular and Mediastinum   Paroxysmal atrial fibrillation (HCC) (Chronic)   Remains of eliquis ,  Antiarythemics managed by cardiology (flecainide )      Essential hypertension - Primary (Chronic)   Well controlled, at goal      (HFpEF) heart failure with preserved ejection fraction (HCC) (Chronic)   Appears well compensated, continue losartan  50 daily, torsemide  20 daily.        Endocrine  Type II diabetes mellitus with neuropathy causing erectile dysfunction (HCC) (Chronic)   A1c well controlled. Having some painful neuropahty, gabapentin  previously helped. Will restart at 300mg  TID,  Continue metformin , if A1c becomes elevated consider adding jardiance given HFpEF        Other   Chronic use of opiate drug for therapeutic purpose (Chronic)   Reviewed PDMP, requesting refill on chronic pain meds for leg and neck pain.  No red flag, still reporting reflief.  Will refill Oxycodone -apap 10-325 #100 per month.      Subacute cough   Suspect post viral. Will give short course of  Robitussin AC.        Medications Ordered Meds ordered this encounter  Medications   guaiFENesin -codeine  100-10 MG/5ML syrup    Sig: Take 5 mLs by mouth every 6 (six) hours as needed for cough.    Dispense:  120 mL    Refill:  1   oxyCODONE -acetaminophen  (PERCOCET) 10-325 MG tablet    Sig: Take 1 tablet by mouth every 6 (six) hours as needed for pain.    Dispense:  100 tablet    Refill:  0    Rx 3/3   oxyCODONE -acetaminophen  (PERCOCET) 10-325 MG tablet    Sig: Take 1 tablet by mouth every 6 (six) hours as needed for pain.    Dispense:  100 tablet    Refill:  0    Rx 2/3   oxyCODONE -acetaminophen  (PERCOCET) 10-325 MG tablet    Sig: Take 1 tablet by mouth every 6 (six) hours as needed for pain.    Dispense:  100 tablet    Refill:  0    Rx 1/3   gabapentin  (NEURONTIN ) 300 MG capsule    Sig: Take 1 capsule (300 mg total) by mouth 3 (three) times daily.    Dispense:  90 capsule    Refill:  2   Other Orders No orders of the defined types were placed in this encounter.  Follow Up: Return in about 3 months (around 08/24/2024) for HTN, DM.

## 2024-05-25 NOTE — Assessment & Plan Note (Signed)
 Appears well compensated, continue losartan  50 daily, torsemide  20 daily.

## 2024-05-25 NOTE — Assessment & Plan Note (Signed)
 Reviewed PDMP, requesting refill on chronic pain meds for leg and neck pain.  No red flag, still reporting reflief.  Will refill Oxycodone -apap 10-325 #100 per month.

## 2024-05-25 NOTE — Patient Instructions (Signed)
 VISIT SUMMARY:  During your visit, we discussed your persistent cough and wheezing, headaches, nasal congestion, diabetes management, blood pressure, and weight. We reviewed your symptoms and made adjustments to your treatment plan to help improve your overall health.  YOUR PLAN:  -CHRONIC ASTHMATIC BRONCHITIS WITH PERSISTENT COUGH AND NOCTURNAL WHEEZING: Chronic asthmatic bronchitis is a long-term inflammation of the airways that causes coughing and wheezing, especially at night. We have prescribed Robitussin AC with codeine , 5 mL every 6 hours as needed for your cough. This should help manage your symptoms more effectively. A coupon has been provided to help with the cost.  -ACUTE OR SUBACUTE RHINITIS AND PHARYNGITIS: Rhinitis and pharyngitis are inflammations of the nasal passages and throat, respectively. Your symptoms are improving slowly. Continue using Flonase  to manage nasal congestion.  -RECURRENT HEADACHE, UNSPECIFIED TYPE: Your headaches are sharp, short-lived, and associated with your coughing episodes. We will monitor these headaches and address them as needed.  -TYPE 2 DIABETES MELLITUS WITH DIABETIC POLYNEUROPATHY AND ERECTILE DYSFUNCTION: Type 2 diabetes is a condition where your body does not use insulin  properly, leading to high blood sugar levels. Your diabetes management is effective, but your neuropathy symptoms are not well-controlled. We will continue to monitor and adjust your treatment as needed.  -ESSENTIAL HYPERTENSION: Essential hypertension is high blood pressure with no identifiable cause. Your blood pressure was slightly elevated today. Managing your weight can help control your blood pressure. We rechecked your blood pressure during the visit.  -OBESITY WITH BMI 35-35.9 AND COMORBIDITY: Obesity is a condition where excess body fat negatively affects your health. Your current weight is 300 pounds. We recommend weight loss to improve your overall health, including blood  pressure and joint pain. Try substituting high-calorie snacks with healthier options, such as steamed vegetables.  INSTRUCTIONS:  Please follow the prescribed medication regimen for your cough and continue using Flonase  for nasal congestion. Monitor your headaches and report any changes. Maintain your diabetes management plan and let us  know if your neuropathy symptoms worsen. Focus on weight management by making healthier food choices. We will recheck your blood pressure at your next visit.

## 2024-05-25 NOTE — Assessment & Plan Note (Signed)
 Well controlled, at goal

## 2024-05-27 ENCOUNTER — Ambulatory Visit (INDEPENDENT_AMBULATORY_CARE_PROVIDER_SITE_OTHER)

## 2024-05-27 VITALS — Ht 73.0 in | Wt 300.0 lb

## 2024-05-27 DIAGNOSIS — Z0000 Encounter for general adult medical examination without abnormal findings: Secondary | ICD-10-CM | POA: Diagnosis not present

## 2024-05-27 NOTE — Progress Notes (Signed)
 Because this visit was a virtual/telehealth visit,  certain criteria was not obtained, such a blood pressure, CBG if applicable, and timed get up and go. Any medications not marked as taking were not mentioned during the medication reconciliation part of the visit. Any vitals not documented were not able to be obtained due to this being a telehealth visit or patient was unable to self-report a recent blood pressure reading due to a lack of equipment at home via telehealth. Vitals that have been documented are verbally provided by the patient.   Subjective:   Juan Calderon is a 68 y.o. who presents for a Medicare Wellness preventive visit.  As a reminder, Annual Wellness Visits don't include a physical exam, and some assessments may be limited, especially if this visit is performed virtually. We may recommend an in-person follow-up visit with your provider if needed.  Visit Complete: Virtual I connected with  Juan Calderon on 05/27/24 by a audio enabled telemedicine application and verified that I am speaking with the correct person using two identifiers.  Patient Location: Home  Provider Location: Office/Clinic  I discussed the limitations of evaluation and management by telemedicine. The patient expressed understanding and agreed to proceed.  Vital Signs: Because this visit was a virtual/telehealth visit, some criteria may be missing or patient reported. Any vitals not documented were not able to be obtained and vitals that have been documented are patient reported.  VideoDeclined- This patient declined Librarian, academic. Therefore the visit was completed with audio only.  Persons Participating in Visit: Patient.  AWV Questionnaire: No: Patient Medicare AWV questionnaire was not completed prior to this visit.  Cardiac Risk Factors include: advanced age (>33men, >79 women);sedentary lifestyle;diabetes mellitus;dyslipidemia;hypertension;family history of  premature cardiovascular disease;male gender;obesity (BMI >30kg/m2)     Objective:    Today's Vitals   05/27/24 1504  Weight: 300 lb (136.1 kg)  Height: 6' 1 (1.854 m)  PainSc: 6   PainLoc: Generalized   Body mass index is 39.58 kg/m.     05/27/2024    3:06 PM 05/25/2024   11:07 AM 02/27/2024   10:42 AM 12/12/2023    9:45 AM 06/24/2023    8:50 AM 05/13/2023   10:56 AM 02/04/2023   11:42 AM  Advanced Directives  Does Patient Have a Medical Advance Directive? No No No No No No No  Would patient like information on creating a medical advance directive? No - Patient declined No - Patient declined No - Patient declined No - Patient declined No - Patient declined No - Patient declined No - Patient declined    Current Medications (verified) Outpatient Encounter Medications as of 05/27/2024  Medication Sig   albuterol  (VENTOLIN  HFA) 108 (90 Base) MCG/ACT inhaler Inhale 1-2 puffs into the lungs every 6 (six) hours as needed for shortness of breath.   allopurinol  (ZYLOPRIM ) 300 MG tablet Take 1 tablet (300 mg total) by mouth daily.   atorvastatin  (LIPITOR) 20 MG tablet Take 1 tablet (20 mg total) by mouth daily.   benzonatate  (TESSALON  PERLES) 100 MG capsule Take 1 capsule (100 mg total) by mouth every 6 (six) hours as needed for cough.   Blood Glucose Monitoring Suppl (ONETOUCH VERIO) w/Device KIT 1 each by Does not apply route daily.   Calcium  Citrate-Vitamin D  315-5 MG-MCG TABS TAKE 2 TABLETS BY MOUTH EVERY DAY   [employer] VITAMIN B12 1000 MCG tablet TAKE 1 TABLET BY MOUTH EVERY DAY   ELIQUIS  5 MG TABS tablet TAKE 1 TABLET  BY MOUTH TWICE A DAY   flecainide  (TAMBOCOR ) 100 MG tablet TAKE 1 TABLET BY MOUTH TWICE A DAY   fluticasone  (FLONASE ) 50 MCG/ACT nasal spray Place 1 spray into both nostrils daily.   gabapentin  (NEURONTIN ) 300 MG capsule Take 1 capsule (300 mg total) by mouth 3 (three) times daily.   glucose blood (ONETOUCH VERIO) test strip Use as instructed   guaiFENesin -codeine  100-10  MG/5ML syrup Take 5 mLs by mouth every 6 (six) hours as needed for cough.   latanoprost  (XALATAN ) 0.005 % ophthalmic solution Place 1 drop into both eyes at bedtime.   losartan  (COZAAR ) 50 MG tablet Take 1 tablet (50 mg total) by mouth daily.   magnesium  oxide (MAG-OX) 400 (240 Mg) MG tablet Take 2 tablets (800 mg total) by mouth daily.   metFORMIN  (GLUCOPHAGE ) 1000 MG tablet Take 1 tablet (1,000 mg total) by mouth 2 (two) times daily with a meal.   nitroGLYCERIN  (NITROSTAT ) 0.4 MG SL tablet Place 1 tablet (0.4 mg total) under the tongue every 5 (five) minutes as needed for chest pain.   ONETOUCH DELICA LANCETS 33G MISC Use 1 strip daily   [START ON 08/13/2024] oxyCODONE -acetaminophen  (PERCOCET) 10-325 MG tablet Take 1 tablet by mouth every 6 (six) hours as needed for pain.   [START ON 07/14/2024] oxyCODONE -acetaminophen  (PERCOCET) 10-325 MG tablet Take 1 tablet by mouth every 6 (six) hours as needed for pain.   [START ON 06/14/2024] oxyCODONE -acetaminophen  (PERCOCET) 10-325 MG tablet Take 1 tablet by mouth every 6 (six) hours as needed for pain.   pantoprazole  (PROTONIX ) 40 MG tablet Take 1 tablet (40 mg total) by mouth daily.   senna (SENOKOT) 8.6 MG TABS tablet Take 2 tablets (17.2 mg total) by mouth daily.   sorbitol  70 % solution Take 30 mLs by mouth daily as needed.   tadalafil  (CIALIS ) 10 MG tablet Take 1 tablet (10 mg total) by mouth as needed for erectile dysfunction.   terazosin  (HYTRIN ) 5 MG capsule Take 1 capsule (5 mg total) by mouth at bedtime.   torsemide  (DEMADEX ) 20 MG tablet Take 1 tablet (20 mg total) by mouth daily.   Wound Dressings (SORBSAN WOUND DRESSING) PADS Apply 10 each topically daily. (Patient not taking: Reported on 01/07/2024)   No facility-administered encounter medications on file as of 05/27/2024.    Allergies (verified) Ramipril, Tape, and Testosterone    History: Past Medical History:  Diagnosis Date   A-fib (HCC)    Alcohol abuse     Ascending aorta  dilation 02/04/2023   TTE 10/2021: 42 mm  TTE 03/06/2023: EF 60-65, no RWMA, moderate LVH, normal RVSF, small pericardial effusion, RAP 3, normal aorta size (root 37 mm, ascending aorta 39 mm)   Asthma    Bradycardia    C6 radiculopathy 01/24/2016   Right upper extremity, mild to moderate electrically by EMG on 01/24/2016   Cataract    Left eye   Chronic diastolic heart failure (HCC)     with mild left ventricular hypertrophy on Echo 02/2010   Chronic kidney disease 02/28/2015   Chronic obstructive pulmonary disease (HCC)     Chronic osteomyelitis of femur (HCC) 04/06/2016   Chronic osteomyelitis of left femur (HCC) 11/22/2017   Left femur s/p prior trauma   Chronic osteomyelitis of left femur (HCC) 11/22/2017   Brodie's abscess: left femur s/p prior trauma.  Underwent partial excision and curettage of left femoral osteomyelitis at [facility] 12/30/2017 with grossly purulent material encountered within the medullary canal of the left distal femur.  Cultures grew MSSA.  Post-operatively received 6 weeks of IV antibiotics through 02/10/2018.  CRP elevated at 60.3 at end of IV antibiotic course so continued on Keflex   Chronic pain syndrome     Left arm and leg s/p traumatic injury    Chronic renal insufficiency     Coronary artery disease     25% LAD stenosis on cath 2007.  Stable angina.   Diverticulosis     Diverticulosis 11/12/2013   Essential hypertension     Frequent PVCs    Gastroesophageal reflux disease     Gout     Hyperlipidemia LDL goal < 100     Internal hemorrhoids without complication 08/12/2018   Long-term current use of opiate analgesic 09/07/2016   Mild carpal tunnel syndrome of right wrist 01/24/2016   Mild degree electrically per EMG 01/24/2016    Mild carpal tunnel syndrome of right wrist 01/24/2016   Mild degree electrically per EMG 01/24/2016    Morbid obesity with BMI of 40.0-44.9, adult (HCC)     Normocytic anemia     NSVT (nonsustained ventricular tachycardia) (HCC)     Obstructive sleep apnea     Moderate, AHI 29.8 per hour with moderately loud snoring and oxygen  desaturation to a nadir of 79%. CPAP titration resulted in a prescription for 17 CWP.     Open-angle glaucoma     Osteoarthritis cervical spine     Osteoarthritis of left knee 06/19/2013   Tricompartmental disease.  Treated with double hinged upright knee brace, steroid/xylocaine  knee injections, and NSAIDs    Osteoporosis 05/14/2017   s/p fracture of the right humerus from a fall at ground hight   Persistent atrial fibrillation (HCC)    Right rotator cuff tear     Large full-thickness tear of the supraspinatus with mild retraction but no atrophy    Right rotator cuff tear 04/25/2013   Large full-thickness tear of the supraspinatus with mild retraction but no atrophy     Secondary male hypogonadism 02/07/2017   Likely secondary to chronic opioid use   Secondary male hypogonadism 02/07/2017   Likely secondary to chronic opioid use   Sleep apnea    Subclinical hypothyroidism     Tubular adenoma of colon 11/22/2017   Specifics unknown.  Repeat colonoscopy 08/12/2018 with six 3-6 mm tubular adenomas removed endoscopically.   Type II diabetes mellitus with neuropathy causing erectile dysfunction (HCC)     Vasomotor rhinitis 04/25/2013   Past Surgical History:  Procedure Laterality Date   A-FLUTTER ABLATION N/A 03/24/2019   Procedure: A-FLUTTER ABLATION;  Surgeon: Waddell Danelle ORN, MD;  Location: [facility] INVASIVE CV LAB;  Service: Cardiovascular;  Laterality: N/A;   CARDIOVERSION N/A 12/30/2014   Procedure: CARDIOVERSION;  Surgeon: Vinie JAYSON Maxcy, MD;  Location: [facility] ENDOSCOPY;  Service: Cardiovascular;  Laterality: N/A;   FRACTURE SURGERY Left 1980's   Elbow   Left arm surgery     Left leg surgery     SHOULDER ARTHROSCOPY WITH DISTAL CLAVICLE RESECTION Left 07/05/2022   Procedure: SHOULDER ARTHROSCOPY WITH DISTAL CLAVICLE EXCISION;  Surgeon: Cristy Bonner DASEN, MD;  Location: [facility];   Service: Orthopedics;  Laterality: Left;   SHOULDER ARTHROSCOPY WITH SUBACROMIAL DECOMPRESSION, ROTATOR CUFF REPAIR AND BICEP TENDON REPAIR Left 07/05/2022   Procedure: SHOULDER ARTHROSCOPY WITH SUBACROMIAL DECOMPRESSION, ROTATOR CUFF REPAIR AND BICEP TENDON TENOTOMY;  Surgeon: Cristy Bonner DASEN, MD;  Location: [facility];  Service: Orthopedics;  Laterality: Left;   SHOULDER SURGERY  Right   Family History  Problem Relation Age of Onset   Heart failure Mother    Asthma Mother    Alzheimer's disease Father    Hypertension Sister    Hypertension Sister    Hypertension Sister    Cancer Brother    Cancer Brother        unknown   Early death Brother        Gun Shot Wound   Cancer Brother        prostate   Prostate cancer Brother    Arthritis Son    Heart attack Neg Hx    Stroke Neg Hx    Colon cancer Neg Hx    Esophageal cancer Neg Hx    Pancreatic cancer Neg Hx    Stomach cancer Neg Hx    Liver disease Neg Hx    Rectal cancer Neg Hx    Social History   Socioeconomic History   Marital status: Widowed    Spouse name: Not on file   Number of children: Not on file   Years of education: Not on file   Highest education level: Not on file  Occupational History   Occupation: Disabled  Tobacco Use   Smoking status: Never   Smokeless tobacco: Never  Vaping Use   Vaping status: Never Used  Substance and Sexual Activity   Alcohol use: Not Currently    Alcohol/week: 14.0 standard drinks of alcohol    Types: 14 Cans of beer per week   Drug use: No   Sexual activity: Not Currently  Other Topics Concern   Not on file  Social History Narrative   Current Social History 12/14/2020      Patient lives with a friend in a home which is 1 story. There are 4 steps with handrails up to the entrance the patient uses.       Patient's method of transportation is personal car.      The highest level of education was high school diploma.      The patient currently disabled.       Identified important Relationships are Family       Pets : None       Interests / Fun: Watch TV       Current Stressors: None      Religious / Personal Beliefs: Runner, broadcasting/film/video, RN,BSN         Social Drivers of Health   Financial Resource Strain: Low Risk  (05/27/2024)   Overall Financial Resource Strain (CARDIA)    Difficulty of Paying Living Expenses: Not hard at all  Food Insecurity: No Food Insecurity (05/27/2024)   Hunger Vital Sign    Worried About Running Out of Food in the Last Year: Never true    Ran Out of Food in the Last Year: Never true  Transportation Needs: No Transportation Needs (05/27/2024)   PRAPARE - Administrator, Civil Service (Medical): No    Lack of Transportation (Non-Medical): No  Physical Activity: Patient Declined (05/27/2024)   Exercise Vital Sign    Days of Exercise per Week: Patient declined    Minutes of Exercise per Session: Patient declined  Stress: No Stress Concern Present (05/27/2024)   [employer] of Occupational Health - Occupational Stress Questionnaire    Feeling of Stress: Not at all  Social Connections: Socially Isolated (05/27/2024)   Social Connection and Isolation Panel    Frequency of Communication with Friends and Family:  Once a week    Frequency of Social Gatherings with Friends and Family: Once a week    Attends Religious Services: Never    Database administrator or Organizations: No    Attends Banker Meetings: Never    Marital Status: Widowed    Tobacco Counseling Counseling given: Not Answered    Clinical Intake:  Pre-visit preparation completed: Yes  Pain : 0-10 Pain Score: 6  Pain Type: Chronic pain Pain Location: Generalized Pain Descriptors / Indicators: Aching Pain Onset: More than a month ago Pain Frequency: Constant Pain Relieving Factors: OXYCODONE   Pain Relieving Factors: OXYCODONE   BMI - recorded: 39.58 Nutritional Status: BMI > 30   Obese Nutritional Risks: None Diabetes: Yes CBG done?: No Did pt. bring in CBG monitor from home?: No  Lab Results  Component Value Date   HGBA1C 6.8 05/12/2024   HGBA1C 6.8 (A) 11/11/2023   HGBA1C 7.0 (A) 08/12/2023     How often do you need to have someone help you when you read instructions, pamphlets, or other written materials from your doctor or pharmacy?: 1 - Never What is the last grade level you completed in school?: HSG  Interpreter Needed?: No  Information entered by :: Roz Fuller, LPN.   Activities of Daily Living     05/27/2024    3:08 PM 06/24/2023    8:50 AM  In your present state of health, do you have any difficulty performing the following activities:  Hearing? 0 0  Vision? 0 0  Difficulty concentrating or making decisions? 0 0  Walking or climbing stairs? 0 0  Dressing or bathing? 0 0  Doing errands, shopping? 0 0  Preparing Food and eating ? N   Using the Toilet? N   In the past six months, have you accidently leaked urine? N   Do you have problems with loss of bowel control? N   Managing your Medications? N   Managing your Finances? N   Housekeeping or managing your Housekeeping? N     Patient Care Team: Rosan Dayton BROCKS, DO as PCP - General (Internal Medicine) Verlin Lonni BIRCH, MD as PCP - Cardiology (Cardiology) Fernande Elspeth BROCKS, MD as PCP - Electrophysiology (Cardiology) Octavia Charleston, MD as Consulting Physician (Ophthalmology) Abran Norleen SAILOR, MD as Consulting Physician (Gastroenterology) Beverley Toribio FALCON, MD (Inactive) as Consulting Physician (Orthopedic Surgery)  I have updated your Care Teams any recent Medical Services you may have received from other providers in the past year.     Assessment:   This is a routine wellness examination for [facility].  Hearing/Vision screen Hearing Screening - Comments:: Denies hearing difficulties.  Vision Screening - Comments:: Wears rx glasses - up to date with routine eye exams with [facility]    Goals Addressed               This Visit's Progress     05/27/2024 (pt-stated)        To maintain my health and stay healthy.      Blood Pressure < 130/80         Depression Screen     05/27/2024    3:09 PM 05/25/2024   11:07 AM 02/27/2024   10:42 AM 12/12/2023    9:45 AM 08/12/2023    4:32 PM 06/24/2023    9:26 AM 02/04/2023   11:39 AM  PHQ 2/9 Scores  PHQ - 2 Score 0 0 0 0 0 0 0  PHQ- 9 Score 6  Fall Risk     05/27/2024    3:06 PM 05/25/2024   11:07 AM 02/27/2024   10:41 AM 12/12/2023    9:44 AM 06/24/2023    8:49 AM  Fall Risk   Falls in the past year? 1 1 1  0 0  Number falls in past yr: 1 1 0 0 0  Injury with Fall? 1 1 1  0 0  Risk for fall due to : History of fall(s);Impaired balance/gait;Orthopedic patient Impaired balance/gait;Impaired mobility Impaired vision Impaired balance/gait;Impaired mobility   Risk for fall due to: Comment  Foot drop.     Follow up Falls evaluation completed;Education provided  Falls prevention discussed Falls prevention discussed Falls evaluation completed    MEDICARE RISK AT HOME:  Medicare Risk at Home Any stairs in or around the home?: No If so, are there any without handrails?: No Home free of loose throw rugs in walkways, pet beds, electrical cords, etc?: Yes Adequate lighting in your home to reduce risk of falls?: Yes Life alert?: No Use of a cane, walker or w/c?: No Grab bars in the bathroom?: Yes Shower chair or bench in shower?: Yes Elevated toilet seat or a handicapped toilet?: Yes (19)  TIMED UP AND GO:  Was the test performed?  No  Cognitive Function: 6CIT completed    05/27/2024    3:08 PM  MMSE - Mini Mental State Exam  Not completed: Unable to complete        05/27/2024    3:08 PM 08/12/2023    4:22 PM  6CIT Screen  What Year? 0 points 0 points  What month? 0 points 0 points  What time? 0 points 0 points  Count back from 20 0 points 0 points  Months in reverse 0 points 0 points   Repeat phrase 0 points 0 points  Total Score 0 points 0 points    Immunizations Immunization History  Administered Date(s) Administered    sv, Bivalent, Protein Subunit Rsvpref,pf (Abrysvo) 06/12/2023   Fluad Quad(high Dose 65+) 08/06/2022   Fluad Trivalent(High Dose 65+) 05/13/2023   Influenza,inj,Quad PF,6+ Mos 06/19/2013, 05/13/2014, 05/24/2016, 05/24/2017, 06/13/2018, 06/29/2019, 06/06/2020, 07/24/2021   PFIZER(Purple Top)SARS-COV-2 Vaccination 10/17/2019, 11/11/2019, 06/25/2020, 03/03/2021, 06/05/2021, 01/02/2022   PNEUMOCOCCAL CONJUGATE-20 08/06/2022   Pfizer(Comirnaty)Fall Seasonal Vaccine 12 years and older 06/23/2022, 05/16/2023, 03/06/2024   Pneumococcal Conjugate-13 07/25/2020   Pneumococcal Polysaccharide-23 08/07/2013   Tdap 06/19/2013   Zoster Recombinant(Shingrix) 06/25/2020    Screening Tests Health Maintenance  Topic Date Due   Zoster Vaccines- Shingrix (2 of 2) 08/20/2020   DTaP/Tdap/Td (2 - Td or Tdap) 06/20/2023   Influenza Vaccine  04/03/2024   HEMOGLOBIN A1C  08/11/2024   OPHTHALMOLOGY EXAM  11/04/2024   Diabetic kidney evaluation - eGFR measurement  11/10/2024   Diabetic kidney evaluation - Urine ACR  11/10/2024   LIPID PANEL  11/10/2024   FOOT EXAM  12/11/2024   Medicare Annual Wellness (AWV)  05/27/2025   Colonoscopy  12/02/2026   Pneumococcal Vaccine: 50+ Years  Completed   COVID-19 Vaccine  Completed   Hepatitis C Screening  Completed   HPV VACCINES  Aged Out   Meningococcal B Vaccine  Aged Out    Health Maintenance Items Addressed: Yes Patient aware of current care gaps.  Immunization record was verified by [facility] and updated in patient's chart. Patient is due for Flu, Dtap and Shingrix.  Additional Screening:  Vision Screening: Recommended annual ophthalmology exams for early detection of glaucoma and other disorders of the eye. Is the patient  up to date with their annual eye exam?  Yes  Who is the provider or what is the name of the office  in which the patient attends annual eye exams? [facility]  Dental Screening: Recommended annual dental exams for proper oral hygiene  Community Resource Referral / Chronic Care Management: CRR required this visit?  No   CCM required this visit?  No   Plan:    I have personally reviewed and noted the following in the patient's chart:   Medical and social history Use of alcohol, tobacco or illicit drugs  Current medications and supplements including opioid prescriptions. Patient is currently taking opioid prescriptions. Information provided to patient regarding non-opioid alternatives. Patient advised to discuss non-opioid treatment plan with their provider. Functional ability and status Nutritional status Physical activity Advanced directives List of other physicians Hospitalizations, surgeries, and ER visits in previous 12 months Vitals Screenings to include cognitive, depression, and falls Referrals and appointments  In addition, I have reviewed and discussed with patient certain preventive protocols, quality metrics, and best practice recommendations. A written personalized care plan for preventive services as well as general preventive health recommendations were provided to patient.   Roz LOISE Fuller, LPN   [id number]   After Visit Summary: (MyChart) Due to this being a telephonic visit, the after visit summary with patients personalized plan was offered to patient via MyChart   Notes: Nothing significant to report at this time.

## 2024-05-27 NOTE — Patient Instructions (Signed)
 [name],  Thank you for taking the time for your Medicare Wellness Visit. I appreciate your continued commitment to your health goals. Please review the care plan we discussed, and feel free to reach out if I can assist you further.  Medicare recommends these wellness visits once per year to help you and your care team stay ahead of potential health issues. These visits are designed to focus on prevention, allowing your provider to concentrate on managing your acute and chronic conditions during your regular appointments.  Please note that Annual Wellness Visits do not include a physical exam. Some assessments may be limited, especially if the visit was conducted virtually. If needed, we may recommend a separate in-person follow-up with your provider.  Ongoing Care Seeing your primary care provider every 3 to 6 months helps us  monitor your health and provide consistent, personalized care.   Referrals If a referral was made during today's visit and you haven't received any updates within two weeks, please contact the referred provider directly to check on the status.  Recommended Screenings:  Health Maintenance  Topic Date Due   Zoster (Shingles) Vaccine (2 of 2) 08/20/2020   DTaP/Tdap/Td vaccine (2 - Td or Tdap) 06/20/2023   Flu Shot  04/03/2024   Hemoglobin A1C  08/11/2024   Eye exam for diabetics  11/04/2024   Yearly kidney function blood test for diabetes  11/10/2024   Yearly kidney health urinalysis for diabetes  11/10/2024   Lipid (cholesterol) test  11/10/2024   Complete foot exam   12/11/2024   Medicare Annual Wellness Visit  05/27/2025   Colon Cancer Screening  12/02/2026   Pneumococcal Vaccine for age over 52  Completed   COVID-19 Vaccine  Completed   Hepatitis C Screening  Completed   HPV Vaccine  Aged Out   Meningitis B Vaccine  Aged Out       05/27/2024    3:06 PM  Advanced Directives  Does Patient Have a Medical Advance Directive? No  Would patient like  information on creating a medical advance directive? No - Patient declined   Advance Care Planning is important because it: Ensures you receive medical care that aligns with your values, goals, and preferences. Provides guidance to your family and loved ones, reducing the emotional burden of decision-making during critical moments.  Vision: Annual vision screenings are recommended for early detection of glaucoma, cataracts, and diabetic retinopathy. These exams can also reveal signs of chronic conditions such as diabetes and high blood pressure.  Dental: Annual dental screenings help detect early signs of oral cancer, gum disease, and other conditions linked to overall health, including heart disease and diabetes.  Please see the attached documents for additional preventive care recommendations.

## 2024-06-10 ENCOUNTER — Other Ambulatory Visit: Payer: Self-pay | Admitting: Student in an Organized Health Care Education/Training Program

## 2024-06-12 ENCOUNTER — Other Ambulatory Visit: Payer: Self-pay | Admitting: Physician Assistant

## 2024-06-12 ENCOUNTER — Ambulatory Visit (INDEPENDENT_AMBULATORY_CARE_PROVIDER_SITE_OTHER): Admitting: Family

## 2024-06-12 DIAGNOSIS — M21372 Foot drop, left foot: Secondary | ICD-10-CM

## 2024-06-12 DIAGNOSIS — M75101 Unspecified rotator cuff tear or rupture of right shoulder, not specified as traumatic: Secondary | ICD-10-CM

## 2024-06-12 DIAGNOSIS — I872 Venous insufficiency (chronic) (peripheral): Secondary | ICD-10-CM | POA: Diagnosis not present

## 2024-06-12 DIAGNOSIS — M12811 Other specific arthropathies, not elsewhere classified, right shoulder: Secondary | ICD-10-CM

## 2024-06-14 ENCOUNTER — Encounter: Payer: Self-pay | Admitting: Family

## 2024-06-14 NOTE — Progress Notes (Signed)
 Office Visit Note   Patient: Juan Calderon           Date of Birth: [birth year]           MRN: [id number] Visit Date: 06/12/2024              Requested by: Rosan Dayton BROCKS, DO [address], [address] [place],  [place] [address] PCP: Rosan Dayton BROCKS, DO  Chief Complaint  Patient presents with   Left Leg - Open Wound      HPI: The patient is a 68 year old gentleman seen in referral from Dr. Cristy with [facility] for evaluation of edema and ulceration to the left lower extremity.  The patient has a history of a foot drop on the left.  He reports that over the last several months his edema has worsened he feels that he is no longer able to wear his compression stockings he has had some waxing and waning blistering over the lateral aspect of the leg  He is currently in a double upright brace and he has had some difficulty from pressure from the brace as well there has been several modifications at [facility] despite modification this is still painful  Assessment & Plan: Visit Diagnoses: No diagnosis found.  Plan: Placed in a Dynaflex compression wrap left lower extremity discussed elevation.  Hope we can get his edema under control ulcer is healed so he can proceed with right shoulder surgery with Dr. Cristy  Follow-Up Instructions: Return in about 1 week (around 06/19/2024).   Ortho Exam  Patient is alert, oriented, no adenopathy, well-dressed, normal affect, normal respiratory effort. On examination left lower extremity there is 2+ pitting edema present up to the tibial tubercle there are scattered healed ulcerations today there is only 1 open ulcer which is 1 cm in diameter laterally filled in with 100% granulation tissue there is no surrounding erythema warmth or drainage no odor he does have several significant healed scars    Imaging: No results found. No images are attached to the encounter.  Labs: Lab Results  Component Value Date   HGBA1C 6.8 05/12/2024    HGBA1C 6.8 (A) 11/11/2023   HGBA1C 7.0 (A) 08/12/2023   ESRSEDRATE 52 (H) 11/12/2017   ESRSEDRATE 37 (H) 04/06/2016   CRP 26.1 (H) 04/06/2016   LABURIC 5.0 07/24/2021   LABURIC 4.0 12/07/2019   LABURIC 5.6 03/23/2019     Lab Results  Component Value Date   ALBUMIN 4.2 05/13/2023   ALBUMIN 4.2 02/04/2023   ALBUMIN 4.1 08/30/2020    Lab Results  Component Value Date   MG 1.4 (L) 11/05/2022   MG 1.2 (L) 04/23/2022   MG 1.6 (L) 10/18/2021   Lab Results  Component Value Date   VD25OH 30.8 10/24/2020    No results found for: PREALBUMIN    Latest Ref Rng & Units 07/19/2023    8:54 AM 02/04/2023    3:14 PM 10/17/2021   12:22 AM  CBC EXTENDED  WBC 3.4 - 10.8 x10E3/uL 7.7  7.6  7.9   RBC 4.14 - 5.80 x10E6/uL 3.79  3.71  4.03   Hemoglobin 13.0 - 17.7 g/dL 88.0  88.1  87.1   HCT 37.5 - 51.0 % 36.8  35.6  40.2   Platelets 150 - 450 x10E3/uL 182  195  121      There is no height or weight on file to calculate BMI.  Orders:  No orders of the defined types were placed in this  encounter.  No orders of the defined types were placed in this encounter.    Procedures: No procedures performed  Clinical Data: No additional findings.  ROS:  All other systems negative, except as noted in the HPI. Review of Systems  Objective: Vital Signs: There were no vitals taken for this visit.  Specialty Comments:  No specialty comments available.  PMFS History: Patient Active Problem List   Diagnosis Date Noted   Subacute cough 05/25/2024   Venous insufficiency 10/16/2023   Left foot drop 05/13/2023   Dysequilibrium 02/04/2023   Ascending aorta dilation 02/04/2023   Housing insecurity 01/22/2022   Chronic kidney disease (CKD), stage III (moderate) (HCC) 10/17/2021   Rotator cuff tendinitis, left 07/24/2021   Erectile dysfunction 01/23/2021   B12 deficiency 10/24/2020   Tubular adenoma of colon 11/22/2017   Chronic use of opiate drug for therapeutic purpose 08/23/2017    Hypomagnesemia 12/28/2016   URI (upper respiratory infection) 10/18/2016   C6 radiculopathy 01/24/2016   Paroxysmal atrial fibrillation (HCC) 10/25/2015   Constipation due to opioid therapy 12/24/2014   Post-traumatic osteoarthritis of left knee 06/19/2013   Obstructive sleep apnea 06/01/2013   Osteoarthritis cervical spine 04/25/2013   Gastroesophageal reflux disease without esophagitis 04/25/2013   Open-angle glaucoma 04/25/2013   Hyperlipidemia 04/25/2013   Type II diabetes mellitus with neuropathy causing erectile dysfunction (HCC) 04/25/2013   Coronary artery disease involving native coronary artery with angina pectoris 04/25/2013   COPD (chronic obstructive pulmonary disease) (HCC) 04/25/2013   Idiopathic chronic gout without tophus 04/25/2013   Severe obesity with body mass index (BMI) of 35.0 to 39.9 with comorbidity (HCC) 04/25/2013   Healthcare maintenance 01/15/2013   (HFpEF) heart failure with preserved ejection fraction (HCC) 02/04/2012   Essential hypertension 09/20/2011   Past Medical History:  Diagnosis Date   A-fib (HCC)    Alcohol abuse     Ascending aorta dilation 02/04/2023   TTE 10/2021: 42 mm  TTE 03/06/2023: EF 60-65, no RWMA, moderate LVH, normal RVSF, small pericardial effusion, RAP 3, normal aorta size (root 37 mm, ascending aorta 39 mm)   Asthma    Bradycardia    C6 radiculopathy 01/24/2016   Right upper extremity, mild to moderate electrically by [facility] on 01/24/2016   Cataract    Left eye   Chronic diastolic heart failure (HCC)     with mild left ventricular hypertrophy on Echo 02/2010   Chronic kidney disease 02/28/2015   Chronic obstructive pulmonary disease (HCC)     Chronic osteomyelitis of femur (HCC) 04/06/2016   Chronic osteomyelitis of left femur (HCC) 11/22/2017   Left femur s/p prior trauma   Chronic osteomyelitis of left femur (HCC) 11/22/2017   Brodie's abscess: left femur s/p prior trauma.  Underwent partial excision and curettage of left  femoral osteomyelitis at [facility] 12/30/2017 with grossly purulent material encountered within the medullary canal of the left distal femur.  Cultures grew MSSA.  Post-operatively received 6 weeks of IV antibiotics through 02/10/2018.  CRP elevated at 60.3 at end of IV antibiotic course so continued on Keflex   Chronic pain syndrome     Left arm and leg s/p traumatic injury    Chronic renal insufficiency     Coronary artery disease     25% LAD stenosis on cath 2007.  Stable angina.   Diverticulosis     Diverticulosis 11/12/2013   Essential hypertension     Frequent PVCs    Gastroesophageal reflux disease     Gout  Hyperlipidemia LDL goal < 100     Internal hemorrhoids without complication 08/12/2018   Long-term current use of opiate analgesic 09/07/2016   Mild carpal tunnel syndrome of right wrist 01/24/2016   Mild degree electrically per [facility] 01/24/2016    Mild carpal tunnel syndrome of right wrist 01/24/2016   Mild degree electrically per EM[facility] 01/24/2016    Morbid obesity with BMI of 40.0-44.9, adult (HCC)     Normocytic anemia     NSVT (nonsustained ventricular tachycardia) (HCC)    Obstructive sleep apnea     Moderate, AHI 29.8 per hour with moderately loud snoring and oxygen  desaturation to a nadir of 79%. CPAP titration resulted in a prescription for 17 CWP.     Open-angle glaucoma     Osteoarthritis cervical spine     Osteoarthritis of left knee 06/19/2013   Tricompartmental disease.  Treated with double hinged upright knee brace, steroid/xylocaine  knee injections, and NSAIDs    Osteoporosis 05/14/2017   s/p fracture of the right humerus from a fall at ground hight   Persistent atrial fibrillation (HCC)    Right rotator cuff tear     Large full-thickness tear of the supraspinatus with mild retraction but no atrophy    Right rotator cuff tear 04/25/2013   Large full-thickness tear of the supraspinatus with mild retraction but no atrophy     Secondary male hypogonadism 02/07/2017    Likely secondary to chronic opioid use   Secondary male hypogonadism 02/07/2017   Likely secondary to chronic opioid use   Sleep apnea    Subclinical hypothyroidism     Tubular adenoma of colon 11/22/2017   Specifics unknown.  Repeat colonoscopy 08/12/2018 with six 3-6 mm tubular adenomas removed endoscopically.   Type II diabetes mellitus with neuropathy causing erectile dysfunction (HCC)     Vasomotor rhinitis 04/25/2013    Family History  Problem Relation Age of Onset   Heart failure Mother    Asthma Mother    Alzheimer's disease Father    Hypertension Sister    Hypertension Sister    Hypertension Sister    Cancer Brother    Cancer Brother        unknown   Early death Brother        Gun Shot Wound   Cancer Brother        prostate   Prostate cancer Brother    Arthritis Son    Heart attack Neg Hx    Stroke Neg Hx    Colon cancer Neg Hx    Esophageal cancer Neg Hx    Pancreatic cancer Neg Hx    Stomach cancer Neg Hx    Liver disease Neg Hx    Rectal cancer Neg Hx     Past Surgical History:  Procedure Laterality Date   A-FLUTTER ABLATION N/A 03/24/2019   Procedure: A-FLUTTER ABLATION;  Surgeon: Waddell Danelle ORN, MD;  Location: [facility];  Service: Cardiovascular;  Laterality: N/A;   CARDIOVERSION N/A 12/30/2014   Procedure: CARDIOVERSION;  Surgeon: Vinie JAYSON Maxcy, MD;  Location: [facility] ENDOSCOPY;  Service: Cardiovascular;  Laterality: N/A;   FRACTURE SURGERY Left 1980's   Elbow   Left arm surgery     Left leg surgery     SHOULDER ARTHROSCOPY WITH DISTAL CLAVICLE RESECTION Left 07/05/2022   Procedure: SHOULDER ARTHROSCOPY WITH DISTAL CLAVICLE EXCISION;  Surgeon: Cristy Bonner DASEN, MD;  Location: [facility];  Service: Orthopedics;  Laterality: Left;   SHOULDER ARTHROSCOPY WITH SUBACROMIAL DECOMPRESSION, ROTATOR  CUFF REPAIR AND BICEP TENDON REPAIR Left 07/05/2022   Procedure: SHOULDER ARTHROSCOPY WITH SUBACROMIAL DECOMPRESSION, ROTATOR CUFF REPAIR AND BICEP  TENDON TENOTOMY;  Surgeon: Cristy Bonner DASEN, MD;  Location: [facility];  Service: Orthopedics;  Laterality: Left;   SHOULDER SURGERY     Right   Social History   Occupational History   Occupation: Disabled  Tobacco Use   Smoking status: Never   Smokeless tobacco: Never  Vaping Use   Vaping status: Never Used  Substance and Sexual Activity   Alcohol use: Not Currently    Alcohol/week: 14.0 standard drinks of alcohol    Types: 14 Cans of beer per week   Drug use: No   Sexual activity: Not Currently

## 2024-06-16 ENCOUNTER — Ambulatory Visit (INDEPENDENT_AMBULATORY_CARE_PROVIDER_SITE_OTHER): Admitting: Family

## 2024-06-16 ENCOUNTER — Encounter: Payer: Self-pay | Admitting: Family

## 2024-06-16 DIAGNOSIS — M21372 Foot drop, left foot: Secondary | ICD-10-CM | POA: Diagnosis not present

## 2024-06-16 DIAGNOSIS — I872 Venous insufficiency (chronic) (peripheral): Secondary | ICD-10-CM | POA: Diagnosis not present

## 2024-06-16 NOTE — Progress Notes (Signed)
 Office Visit Note   Patient: Juan Calderon           Date of Birth: 1956/08/23           MRN: [id number] Visit Date: 06/16/2024              Requested by: Rosan Dayton BROCKS, DO [address] [place],  [place] [address] PCP: Rosan Dayton BROCKS, DO  Chief Complaint  Patient presents with   Left Leg - Wound Check      HPI: The patient is a 68 year old gentleman seen in referral from Dr. Cristy with [facility] for evaluation of edema and ulceration to the left lower extremity.  The patient has a history of a foot drop on the left.  He reports that over the last several months his edema has worsened he feels that he is no longer able to wear his compression stockings he has had some waxing and waning blistering over the lateral aspect of the leg  He is currently in a double upright brace and he has had some difficulty from pressure from the brace as well there has been several modifications at [facility] despite modification this is still painful  Assessment & Plan: Visit Diagnoses: No diagnosis found.  Plan: Placed in a Dynaflex compression wrap left lower extremity discussed elevation.  Hope we can get his edema under control ulcer is healed so he can proceed with right shoulder surgery with Dr. Cristy  Follow-Up Instructions: Return in about 1 week (around 06/23/2024).   Ortho Exam  Patient is alert, oriented, no adenopathy, well-dressed, normal affect, normal respiratory effort. On examination left lower extremity there is 2+ pitting edema present up to the tibial tubercle there are scattered healed ulcerations today there is only 1 open ulcer which is 1 cm in diameter laterally filled in with 100% granulation tissue there is no surrounding erythema warmth or drainage no odor he does have several significant healed scars    Imaging: No results found. No images are attached to the encounter.  Labs: Lab Results  Component Value Date   HGBA1C 6.8 05/12/2024    HGBA1C 6.8 (A) 11/11/2023   HGBA1C 7.0 (A) 08/12/2023   ESRSEDRATE 52 (H) 11/12/2017   ESRSEDRATE 37 (H) 04/06/2016   CRP 26.1 (H) 04/06/2016   LABURIC 5.0 07/24/2021   LABURIC 4.0 12/07/2019   LABURIC 5.6 03/23/2019     Lab Results  Component Value Date   ALBUMIN 4.2 05/13/2023   ALBUMIN 4.2 02/04/2023   ALBUMIN 4.1 08/30/2020    Lab Results  Component Value Date   MG 1.4 (L) 11/05/2022   MG 1.2 (L) 04/23/2022   MG 1.6 (L) 10/18/2021   Lab Results  Component Value Date   VD25OH 30.8 10/24/2020    No results found for: PREALBUMIN    Latest Ref Rng & Units 07/19/2023    8:54 AM 02/04/2023    3:14 PM 10/17/2021   12:22 AM  CBC EXTENDED  WBC 3.4 - 10.8 x10E3/uL 7.7  7.6  7.9   RBC 4.14 - 5.80 x10E6/uL 3.79  3.71  4.03   Hemoglobin 13.0 - 17.7 g/dL 88.0  88.1  87.1   HCT 37.5 - 51.0 % 36.8  35.6  40.2   Platelets 150 - 450 x10E3/uL 182  195  121      There is no height or weight on file to calculate BMI.  Orders:  No orders of the defined types were placed in this  encounter.  No orders of the defined types were placed in this encounter.    Procedures: No procedures performed  Clinical Data: No additional findings.  ROS:  All other systems negative, except as noted in the HPI. Review of Systems  Objective: Vital Signs: There were no vitals taken for this visit.  Specialty Comments:  No specialty comments available.  PMFS History: Patient Active Problem List   Diagnosis Date Noted   Subacute cough 05/25/2024   Venous insufficiency 10/16/2023   Left foot drop 05/13/2023   Dysequilibrium 02/04/2023   Ascending aorta dilation 02/04/2023   Housing insecurity 01/22/2022   Chronic kidney disease (CKD), stage III (moderate) (HCC) 10/17/2021   Rotator cuff tendinitis, left 07/24/2021   Erectile dysfunction 01/23/2021   B12 deficiency 10/24/2020   Tubular adenoma of colon 11/22/2017   Chronic use of opiate drug for therapeutic purpose 08/23/2017    Hypomagnesemia 12/28/2016   URI (upper respiratory infection) 10/18/2016   C6 radiculopathy 01/24/2016   Paroxysmal atrial fibrillation (HCC) 10/25/2015   Constipation due to opioid therapy 12/24/2014   Post-traumatic osteoarthritis of left knee 06/19/2013   Obstructive sleep apnea 06/01/2013   Osteoarthritis cervical spine 04/25/2013   Gastroesophageal reflux disease without esophagitis 04/25/2013   Open-angle glaucoma 04/25/2013   Hyperlipidemia 04/25/2013   Type II diabetes mellitus with neuropathy causing erectile dysfunction (HCC) 04/25/2013   Coronary artery disease involving native coronary artery with angina pectoris 04/25/2013   COPD (chronic obstructive pulmonary disease) (HCC) 04/25/2013   Idiopathic chronic gout without tophus 04/25/2013   Severe obesity with body mass index (BMI) of 35.0 to 39.9 with comorbidity (HCC) 04/25/2013   Healthcare maintenance 01/15/2013   (HFpEF) heart failure with preserved ejection fraction (HCC) 02/04/2012   Essential hypertension 09/20/2011   Past Medical History:  Diagnosis Date   A-fib (HCC)    Alcohol abuse     Ascending aorta dilation 02/04/2023   TTE 10/2021: 42 mm  TTE 03/06/2023: EF 60-65, no RWMA, moderate LVH, normal RVSF, small pericardial effusion, RAP 3, normal aorta size (root 37 mm, ascending aorta 39 mm)   Asthma    Bradycardia    C6 radiculopathy 01/24/2016   Right upper extremity, mild to moderate electrically by EM[facility] on 01/24/2016   Cataract    Left eye   Chronic diastolic heart failure (HCC)     with mild left ventricular hypertrophy on Echo 02/2010   Chronic kidney disease 02/28/2015   Chronic obstructive pulmonary disease (HCC)     Chronic osteomyelitis of femur (HCC) 04/06/2016   Chronic osteomyelitis of left femur (HCC) 11/22/2017   Left femur s/p prior trauma   Chronic osteomyelitis of left femur (HCC) 11/22/2017   Brodie's abscess: left femur s/p prior trauma.  Underwent partial excision and curettage of left  femoral osteomyelitis at [facility] 12/30/2017 with grossly purulent material encountered within the medullary canal of the left distal femur.  Cultures grew MSSA.  Post-operatively received 6 weeks of IV antibiotics through 02/10/2018.  CRP elevated at 60.3 at end of IV antibiotic course so continued on Keflex   Chronic pain syndrome     Left arm and leg s/p traumatic injury    Chronic renal insufficiency     Coronary artery disease     25% LAD stenosis on cath 2007.  Stable angina.   Diverticulosis     Diverticulosis 11/12/2013   Essential hypertension     Frequent PVCs    Gastroesophageal reflux disease     Gout  Hyperlipidemia LDL goal < 100     Internal hemorrhoids without complication 08/12/2018   Long-term current use of opiate analgesic 09/07/2016   Mild carpal tunnel syndrome of right wrist 01/24/2016   Mild degree electrically per EM[facility] 01/24/2016    Mild carpal tunnel syndrome of right wrist 01/24/2016   Mild degree electrically per EMG 01/24/2016    Morbid obesity with BMI of 40.0-44.9, adult (HCC)     Normocytic anemia     NSVT (nonsustained ventricular tachycardia) (HCC)    Obstructive sleep apnea     Moderate, AHI 29.8 per hour with moderately loud snoring and oxygen  desaturation to a nadir of 79%. CPAP titration resulted in a prescription for 17 CWP.     Open-angle glaucoma     Osteoarthritis cervical spine     Osteoarthritis of left knee 06/19/2013   Tricompartmental disease.  Treated with double hinged upright knee brace, steroid/xylocaine  knee injections, and NSAIDs    Osteoporosis 05/14/2017   s/p fracture of the right humerus from a fall at ground [place]   Persistent atrial fibrillation (HCC)    Right rotator cuff tear     Large full-thickness tear of the supraspinatus with mild retraction but no atrophy    Right rotator cuff tear 04/25/2013   Large full-thickness tear of the supraspinatus with mild retraction but no atrophy     Secondary male hypogonadism 02/07/2017    Likely secondary to chronic opioid use   Secondary male hypogonadism 02/07/2017   Likely secondary to chronic opioid use   Sleep apnea    Subclinical hypothyroidism     Tubular adenoma of colon 11/22/2017   Specifics unknown.  Repeat colonoscopy 08/12/2018 with six 3-6 mm tubular adenomas removed endoscopically.   Type II diabetes mellitus with neuropathy causing erectile dysfunction (HCC)     Vasomotor rhinitis 04/25/2013    Family History  Problem Relation Age of Onset   Heart failure Mother    Asthma Mother    Alzheimer's disease Father    Hypertension Sister    Hypertension Sister    Hypertension Sister    Cancer Brother    Cancer Brother        unknown   Early death Brother        Gun Shot Wound   Cancer Brother        prostate   Prostate cancer Brother    Arthritis Son    Heart attack Neg Hx    Stroke Neg Hx    Colon cancer Neg Hx    Esophageal cancer Neg Hx    Pancreatic cancer Neg Hx    Stomach cancer Neg Hx    Liver disease Neg Hx    Rectal cancer Neg Hx     Past Surgical History:  Procedure Laterality Date   A-FLUTTER ABLATION N/A 03/24/2019   Procedure: A-FLUTTER ABLATION;  Surgeon: Waddell Danelle ORN, MD;  Location: [facility];  Service: Cardiovascular;  Laterality: N/A;   CARDIOVERSION N/A 12/30/2014   Procedure: CARDIOVERSION;  Surgeon: Vinie JAYSON Maxcy, MD;  Location: [facility] ENDOSCOPY;  Service: Cardiovascular;  Laterality: N/A;   FRACTURE SURGERY Left 1980's   Elbow   Left arm surgery     Left leg surgery     SHOULDER ARTHROSCOPY WITH DISTAL CLAVICLE RESECTION Left 07/05/2022   Procedure: SHOULDER ARTHROSCOPY WITH DISTAL CLAVICLE EXCISION;  Surgeon: Cristy Bonner DASEN, MD;  Location: [facility];  Service: Orthopedics;  Laterality: Left;   SHOULDER ARTHROSCOPY WITH SUBACROMIAL DECOMPRESSION, ROTATOR  CUFF REPAIR AND BICEP TENDON REPAIR Left 07/05/2022   Procedure: SHOULDER ARTHROSCOPY WITH SUBACROMIAL DECOMPRESSION, ROTATOR CUFF REPAIR AND BICEP  TENDON TENOTOMY;  Surgeon: Cristy Bonner DASEN, MD;  Location: [facility];  Service: Orthopedics;  Laterality: Left;   SHOULDER SURGERY     Right   Social History   Occupational History   Occupation: Disabled  Tobacco Use   Smoking status: Never   Smokeless tobacco: Never  Vaping Use   Vaping status: Never Used  Substance and Sexual Activity   Alcohol use: Not Currently    Alcohol/week: 14.0 standard drinks of alcohol    Types: 14 Cans of beer per week   Drug use: No   Sexual activity: Not Currently

## 2024-06-19 ENCOUNTER — Encounter: Payer: Self-pay | Admitting: Family

## 2024-06-19 ENCOUNTER — Ambulatory Visit (INDEPENDENT_AMBULATORY_CARE_PROVIDER_SITE_OTHER): Admitting: Family

## 2024-06-19 DIAGNOSIS — M21372 Foot drop, left foot: Secondary | ICD-10-CM | POA: Diagnosis not present

## 2024-06-19 DIAGNOSIS — I872 Venous insufficiency (chronic) (peripheral): Secondary | ICD-10-CM

## 2024-06-19 NOTE — Progress Notes (Signed)
 Office Visit Note   Patient: Juan Calderon           Date of Birth: 03-23-1956           MRN: [id number] Visit Date: 06/19/2024              Requested by: Rosan Dayton BROCKS, DO [address] [place],  [place] [address] PCP: Rosan Dayton BROCKS, DO  Chief Complaint  Patient presents with   Left Leg - Follow-up      HPI: The patient is a 68 year old gentleman seen in referral from Dr. Cristy with [facility] for evaluation of edema and ulceration to the left lower extremity.   10/17: Seen today in follow-up has been in a Dynaflex compression wrap with some silver cell to the 2 ulcerative areas.  There is incremental improvement in the ulcers  10/10: patient has a history of a foot drop on the left.  He reports that over the last several months his edema has worsened he feels that he is no longer able to wear his compression stockings he has had some waxing and waning blistering over the lateral aspect of the leg  He is currently in a double upright brace and he has had some difficulty from pressure from the brace as well there has been several modifications at [facility] despite modification this is still painful.  Have reached out to [facility] with [facility] to see if he could transition to a different AFO that may be more comfortable  Assessment & Plan: Visit Diagnoses: No diagnosis found.  Plan: Placed in a Unna and Dynaflex compression wrap left lower extremity discussed elevation.    Hope we can get his edema under control ulcer is healed so he can proceed with right shoulder surgery with Dr. Cristy  Follow-Up Instructions: No follow-ups on file.   Ortho Exam  Patient is alert, oriented, no adenopathy, well-dressed, normal affect, normal respiratory effort. On examination left lower extremity there reduced edema without pitting today.  There are 2 lateral ulcerations which are 8 mm in diameter today with bleeding granulation there is no sign of  infection    Imaging: No results found. No images are attached to the encounter.  Labs: Lab Results  Component Value Date   HGBA1C 6.8 05/12/2024   HGBA1C 6.8 (A) 11/11/2023   HGBA1C 7.0 (A) 08/12/2023   ESRSEDRATE 52 (H) 11/12/2017   ESRSEDRATE 37 (H) 04/06/2016   CRP 26.1 (H) 04/06/2016   LABURIC 5.0 07/24/2021   LABURIC 4.0 12/07/2019   LABURIC 5.6 03/23/2019     Lab Results  Component Value Date   ALBUMIN 4.2 05/13/2023   ALBUMIN 4.2 02/04/2023   ALBUMIN 4.1 08/30/2020    Lab Results  Component Value Date   MG 1.4 (L) 11/05/2022   MG 1.2 (L) 04/23/2022   MG 1.6 (L) 10/18/2021   Lab Results  Component Value Date   VD25OH 30.8 10/24/2020    No results found for: PREALBUMIN    Latest Ref Rng & Units 07/19/2023    8:54 AM 02/04/2023    3:14 PM 10/17/2021   12:22 AM  CBC EXTENDED  WBC 3.4 - 10.8 x10E3/uL 7.7  7.6  7.9   RBC 4.14 - 5.80 x10E6/uL 3.79  3.71  4.03   Hemoglobin 13.0 - 17.7 g/dL 88.0  88.1  87.1   HCT 37.5 - 51.0 % 36.8  35.6  40.2   Platelets 150 - 450 x10E3/uL 182  195  121      There is no height or weight on file to calculate BMI.  Orders:  No orders of the defined types were placed in this encounter.  No orders of the defined types were placed in this encounter.    Procedures: No procedures performed  Clinical Data: No additional findings.  ROS:  All other systems negative, except as noted in the HPI. Review of Systems  Objective: Vital Signs: There were no vitals taken for this visit.  Specialty Comments:  No specialty comments available.  PMFS History: Patient Active Problem List   Diagnosis Date Noted   Subacute cough 05/25/2024   Venous insufficiency 10/16/2023   Left foot drop 05/13/2023   Dysequilibrium 02/04/2023   Ascending aorta dilation 02/04/2023   Housing insecurity 01/22/2022   Chronic kidney disease (CKD), stage III (moderate) (HCC) 10/17/2021   Rotator cuff tendinitis, left 07/24/2021   Erectile  dysfunction 01/23/2021   B12 deficiency 10/24/2020   Tubular adenoma of colon 11/22/2017   Chronic use of opiate drug for therapeutic purpose 08/23/2017   Hypomagnesemia 12/28/2016   URI (upper respiratory infection) 10/18/2016   C6 radiculopathy 01/24/2016   Paroxysmal atrial fibrillation (HCC) 10/25/2015   Constipation due to opioid therapy 12/24/2014   Post-traumatic osteoarthritis of left knee 06/19/2013   Obstructive sleep apnea 06/01/2013   Osteoarthritis cervical spine 04/25/2013   Gastroesophageal reflux disease without esophagitis 04/25/2013   Open-angle glaucoma 04/25/2013   Hyperlipidemia 04/25/2013   Type II diabetes mellitus with neuropathy causing erectile dysfunction (HCC) 04/25/2013   Coronary artery disease involving native coronary artery with angina pectoris 04/25/2013   COPD (chronic obstructive pulmonary disease) (HCC) 04/25/2013   Idiopathic chronic gout without tophus 04/25/2013   Severe obesity with body mass index (BMI) of 35.0 to 39.9 with comorbidity (HCC) 04/25/2013   Healthcare maintenance 01/15/2013   (HFpEF) heart failure with preserved ejection fraction (HCC) 02/04/2012   Essential hypertension 09/20/2011   Past Medical History:  Diagnosis Date   A-fib (HCC)    Alcohol abuse     Ascending aorta dilation 02/04/2023   TTE 10/2021: 42 mm  TTE 03/06/2023: EF 60-65, no RWMA, moderate LVH, normal RVSF, small pericardial effusion, RAP 3, normal aorta size (root 37 mm, ascending aorta 39 mm)   Asthma    Bradycardia    C6 radiculopathy 01/24/2016   Right upper extremity, mild to moderate electrically by EMG on 01/24/2016   Cataract    Left eye   Chronic diastolic heart failure (HCC)     with mild left ventricular hypertrophy on Echo 02/2010   Chronic kidney disease 02/28/2015   Chronic obstructive pulmonary disease (HCC)     Chronic osteomyelitis of femur (HCC) 04/06/2016   Chronic osteomyelitis of left femur (HCC) 11/22/2017   Left femur s/p prior trauma    Chronic osteomyelitis of left femur (HCC) 11/22/2017   Brodie's abscess: left femur s/p prior trauma.  Underwent partial excision and curettage of left femoral osteomyelitis at [facility] 12/30/2017 with grossly purulent material encountered within the medullary canal of the left distal femur.  Cultures grew MSSA.  Post-operatively received 6 weeks of IV antibiotics through 02/10/2018.  CRP elevated at 60.3 at end of IV antibiotic course so continued on Keflex   Chronic pain syndrome     Left arm and leg s/p traumatic injury    Chronic renal insufficiency     Coronary artery disease     25% LAD stenosis on cath 2007.  Stable angina.   Diverticulosis  Diverticulosis 11/12/2013   Essential hypertension     Frequent PVCs    Gastroesophageal reflux disease     Gout     Hyperlipidemia LDL goal < 100     Internal hemorrhoids without complication 08/12/2018   Long-term current use of opiate analgesic 09/07/2016   Mild carpal tunnel syndrome of right wrist 01/24/2016   Mild degree electrically per EMG 01/24/2016    Mild carpal tunnel syndrome of right wrist 01/24/2016   Mild degree electrically per EMG 01/24/2016    Morbid obesity with BMI of 40.0-44.9, adult (HCC)     Normocytic anemia     NSVT (nonsustained ventricular tachycardia) (HCC)    Obstructive sleep apnea     Moderate, AHI 29.8 per hour with moderately loud snoring and oxygen  desaturation to a nadir of 79%. CPAP titration resulted in a prescription for 17 CWP.     Open-angle glaucoma     Osteoarthritis cervical spine     Osteoarthritis of left knee 06/19/2013   Tricompartmental disease.  Treated with double hinged upright knee brace, steroid/xylocaine  knee injections, and NSAIDs    Osteoporosis 05/14/2017   s/p fracture of the right humerus from a fall at ground hight   Persistent atrial fibrillation (HCC)    Right rotator cuff tear     Large full-thickness tear of the supraspinatus with mild retraction but no atrophy    Right rotator  cuff tear 04/25/2013   Large full-thickness tear of the supraspinatus with mild retraction but no atrophy     Secondary male hypogonadism 02/07/2017   Likely secondary to chronic opioid use   Secondary male hypogonadism 02/07/2017   Likely secondary to chronic opioid use   Sleep apnea    Subclinical hypothyroidism     Tubular adenoma of colon 11/22/2017   Specifics unknown.  Repeat colonoscopy 08/12/2018 with six 3-6 mm tubular adenomas removed endoscopically.   Type II diabetes mellitus with neuropathy causing erectile dysfunction (HCC)     Vasomotor rhinitis 04/25/2013    Family History  Problem Relation Age of Onset   Heart failure Mother    Asthma Mother    Alzheimer's disease Father    Hypertension Sister    Hypertension Sister    Hypertension Sister    Cancer Brother    Cancer Brother        unknown   Early death Brother        Gun Shot Wound   Cancer Brother        prostate   Prostate cancer Brother    Arthritis Son    Heart attack Neg Hx    Stroke Neg Hx    Colon cancer Neg Hx    Esophageal cancer Neg Hx    Pancreatic cancer Neg Hx    Stomach cancer Neg Hx    Liver disease Neg Hx    Rectal cancer Neg Hx     Past Surgical History:  Procedure Laterality Date   A-FLUTTER ABLATION N/A 03/24/2019   Procedure: A-FLUTTER ABLATION;  Surgeon: Waddell Danelle ORN, MD;  Location: [facility];  Service: Cardiovascular;  Laterality: N/A;   CARDIOVERSION N/A 12/30/2014   Procedure: CARDIOVERSION;  Surgeon: Vinie JAYSON Maxcy, MD;  Location: [facility] ENDOSCOPY;  Service: Cardiovascular;  Laterality: N/A;   FRACTURE SURGERY Left 1980's   Elbow   Left arm surgery     Left leg surgery     SHOULDER ARTHROSCOPY WITH DISTAL CLAVICLE RESECTION Left 07/05/2022   Procedure: SHOULDER ARTHROSCOPY WITH DISTAL CLAVICLE  EXCISION;  Surgeon: Cristy Bonner DASEN, MD;  Location: [facility];  Service: Orthopedics;  Laterality: Left;   SHOULDER ARTHROSCOPY WITH SUBACROMIAL DECOMPRESSION,  ROTATOR CUFF REPAIR AND BICEP TENDON REPAIR Left 07/05/2022   Procedure: SHOULDER ARTHROSCOPY WITH SUBACROMIAL DECOMPRESSION, ROTATOR CUFF REPAIR AND BICEP TENDON TENOTOMY;  Surgeon: Cristy Bonner DASEN, MD;  Location: [facility];  Service: Orthopedics;  Laterality: Left;   SHOULDER SURGERY     Right   Social History   Occupational History   Occupation: Disabled  Tobacco Use   Smoking status: Never   Smokeless tobacco: Never  Vaping Use   Vaping status: Never Used  Substance and Sexual Activity   Alcohol use: Not Currently    Alcohol/week: 14.0 standard drinks of alcohol    Types: 14 Cans of beer per week   Drug use: No   Sexual activity: Not Currently

## 2024-06-23 ENCOUNTER — Ambulatory Visit: Admitting: Family

## 2024-06-23 ENCOUNTER — Other Ambulatory Visit: Payer: Self-pay | Admitting: Student in an Organized Health Care Education/Training Program

## 2024-06-23 DIAGNOSIS — I872 Venous insufficiency (chronic) (peripheral): Secondary | ICD-10-CM | POA: Diagnosis not present

## 2024-06-23 DIAGNOSIS — M21372 Foot drop, left foot: Secondary | ICD-10-CM

## 2024-06-26 ENCOUNTER — Encounter: Payer: Self-pay | Admitting: Family

## 2024-06-26 ENCOUNTER — Ambulatory Visit: Admitting: Family

## 2024-06-26 ENCOUNTER — Ambulatory Visit
Admission: RE | Admit: 2024-06-26 | Discharge: 2024-06-26 | Disposition: A | Discharge status: Home / Self Care | Source: Ambulatory Visit | Attending: Physician Assistant | Admitting: Physician Assistant

## 2024-06-26 DIAGNOSIS — M12811 Other specific arthropathies, not elsewhere classified, right shoulder: Secondary | ICD-10-CM

## 2024-06-26 DIAGNOSIS — M75101 Unspecified rotator cuff tear or rupture of right shoulder, not specified as traumatic: Secondary | ICD-10-CM

## 2024-06-26 NOTE — Progress Notes (Signed)
 Office Visit Note   Patient: Juan Calderon           Date of Birth: August 12, 1956           MRN: [id number] Visit Date: 06/23/2024              Requested by: Rosan Dayton BROCKS, DO [address] [place], [address] [place],  [place] [address] PCP: Rosan Dayton BROCKS, DO  Chief Complaint  Patient presents with   Left Leg - Follow-up      HPI: The patient is a 68 year old gentleman seen in follow-up for edema with ulceration to the left lower extremity has been in and Dynaflex compression with silver cell for the last 2 visits  Assessment & Plan: Visit Diagnoses: No diagnosis found.  Plan: On examination left lower extremity the edema is well-controlled recommended he resume his compression garments he will follow-up with [facility] for an appointment for modification to his double upright brace/AFO to the left lower extremity.  He will follow-up with Dr. Cristy to discuss scheduling his surgery  Follow-Up Instructions: No follow-ups on file.   Ortho Exam  Patient is alert, oriented, no adenopathy, well-dressed, normal affect, normal respiratory effort. On examination left lower extremity there are scattered well-healed scars his edema is stable there are no open ulcers today no erythema no warmth no sign of infection    Imaging: No results found. No images are attached to the encounter.  Labs: Lab Results  Component Value Date   HGBA1C 6.8 05/12/2024   HGBA1C 6.8 (A) 11/11/2023   HGBA1C 7.0 (A) 08/12/2023   ESRSEDRATE 52 (H) 11/12/2017   ESRSEDRATE 37 (H) 04/06/2016   CRP 26.1 (H) 04/06/2016   LABURIC 5.0 07/24/2021   LABURIC 4.0 12/07/2019   LABURIC 5.6 03/23/2019     Lab Results  Component Value Date   ALBUMIN 4.2 05/13/2023   ALBUMIN 4.2 02/04/2023   ALBUMIN 4.1 08/30/2020    Lab Results  Component Value Date   MG 1.4 (L) 11/05/2022   MG 1.2 (L) 04/23/2022   MG 1.6 (L) 10/18/2021   Lab Results  Component Value Date   VD25OH 30.8 10/24/2020    No results  found for: PREALBUMIN    Latest Ref Rng & Units 07/19/2023    8:54 AM 02/04/2023    3:14 PM 10/17/2021   12:22 AM  CBC EXTENDED  WBC 3.4 - 10.8 x10E3/uL 7.7  7.6  7.9   RBC 4.14 - 5.80 x10E6/uL 3.79  3.71  4.03   Hemoglobin 13.0 - 17.7 g/dL 88.0  88.1  87.1   HCT 37.5 - 51.0 % 36.8  35.6  40.2   Platelets 150 - 450 x10E3/uL 182  195  121      There is no height or weight on file to calculate BMI.  Orders:  No orders of the defined types were placed in this encounter.  No orders of the defined types were placed in this encounter.    Procedures: No procedures performed  Clinical Data: No additional findings.  ROS:  All other systems negative, except as noted in the HPI. Review of Systems  Objective: Vital Signs: There were no vitals taken for this visit.  Specialty Comments:  No specialty comments available.  PMFS History: Patient Active Problem List   Diagnosis Date Noted   Subacute cough 05/25/2024   Venous insufficiency 10/16/2023   Left foot drop 05/13/2023   Dysequilibrium 02/04/2023   Ascending aorta dilation 02/04/2023   Housing  insecurity 01/22/2022   Chronic kidney disease (CKD), stage III (moderate) (HCC) 10/17/2021   Rotator cuff tendinitis, left 07/24/2021   Erectile dysfunction 01/23/2021   B12 deficiency 10/24/2020   Tubular adenoma of colon 11/22/2017   Chronic use of opiate drug for therapeutic purpose 08/23/2017   Hypomagnesemia 12/28/2016   URI (upper respiratory infection) 10/18/2016   C6 radiculopathy 01/24/2016   Paroxysmal atrial fibrillation (HCC) 10/25/2015   Constipation due to opioid therapy 12/24/2014   Post-traumatic osteoarthritis of left knee 06/19/2013   Obstructive sleep apnea 06/01/2013   Osteoarthritis cervical spine 04/25/2013   Gastroesophageal reflux disease without esophagitis 04/25/2013   Open-angle glaucoma 04/25/2013   Hyperlipidemia 04/25/2013   Type II diabetes mellitus with neuropathy causing erectile  dysfunction (HCC) 04/25/2013   Coronary artery disease involving native coronary artery with angina pectoris 04/25/2013   COPD (chronic obstructive pulmonary disease) (HCC) 04/25/2013   Idiopathic chronic gout without tophus 04/25/2013   Severe obesity with body mass index (BMI) of 35.0 to 39.9 with comorbidity (HCC) 04/25/2013   Healthcare maintenance 01/15/2013   (HFpEF) heart failure with preserved ejection fraction (HCC) 02/04/2012   Essential hypertension 09/20/2011   Past Medical History:  Diagnosis Date   A-fib [facility])    Alcohol abuse     Ascending aorta dilation 02/04/2023   TTE 10/2021: 42 mm  TTE 03/06/2023: EF 60-65, no RWMA, moderate LVH, normal RVSF, small pericardial effusion, RAP 3, normal aorta size (root 37 mm, ascending aorta 39 mm)   Asthma    Bradycardia    C6 radiculopathy 01/24/2016   Right upper extremity, mild to moderate electrically by EMG on 01/24/2016   Cataract    Left eye   Chronic diastolic heart failure (HCC)     with mild left ventricular hypertrophy on Echo 02/2010   Chronic kidney disease 02/28/2015   Chronic obstructive pulmonary disease (HCC)     Chronic osteomyelitis of femur (HCC) 04/06/2016   Chronic osteomyelitis of left femur (HCC) 11/22/2017   Left femur s/p prior trauma   Chronic osteomyelitis of left femur (HCC) 11/22/2017   Brodie's abscess: left femur s/p prior trauma.  Underwent partial excision and curettage of left femoral osteomyelitis at [facility] 12/30/2017 with grossly purulent material encountered within the medullary canal of the left distal femur.  Cultures grew MSSA.  Post-operatively received 6 weeks of IV antibiotics through 02/10/2018.  CRP elevated at 60.3 at end of IV antibiotic course so continued on Keflex   Chronic pain syndrome     Left arm and leg s/p traumatic injury    Chronic renal insufficiency     Coronary artery disease     25% LAD stenosis on cath 2007.  Stable angina.   Diverticulosis     Diverticulosis 11/12/2013    Essential hypertension     Frequent PVCs    Gastroesophageal reflux disease     Gout     Hyperlipidemia LDL goal < 100     Internal hemorrhoids without complication 08/12/2018   Long-term current use of opiate analgesic 09/07/2016   Mild carpal tunnel syndrome of right wrist 01/24/2016   Mild degree electrically per EMG 01/24/2016    Mild carpal tunnel syndrome of right wrist 01/24/2016   Mild degree electrically per EMG 01/24/2016    Morbid obesity with BMI of 40.0-44.9, adult (HCC)     Normocytic anemia     NSVT (nonsustained ventricular tachycardia) (HCC)    Obstructive sleep apnea     Moderate, AHI 29.8 per hour with  moderately loud snoring and oxygen  desaturation to a nadir of 79%. CPAP titration resulted in a prescription for 17 CWP.     Open-angle glaucoma     Osteoarthritis cervical spine     Osteoarthritis of left knee 06/19/2013   Tricompartmental disease.  Treated with double hinged upright knee brace, steroid/xylocaine  knee injections, and NSAIDs    Osteoporosis 05/14/2017   s/p fracture of the right humerus from a fall at ground hight   Persistent atrial fibrillation (HCC)    Right rotator cuff tear     Large full-thickness tear of the supraspinatus with mild retraction but no atrophy    Right rotator cuff tear 04/25/2013   Large full-thickness tear of the supraspinatus with mild retraction but no atrophy     Secondary male hypogonadism 02/07/2017   Likely secondary to chronic opioid use   Secondary male hypogonadism 02/07/2017   Likely secondary to chronic opioid use   Sleep apnea    Subclinical hypothyroidism     Tubular adenoma of colon 11/22/2017   Specifics unknown.  Repeat colonoscopy 08/12/2018 with six 3-6 mm tubular adenomas removed endoscopically.   Type II diabetes mellitus with neuropathy causing erectile dysfunction (HCC)     Vasomotor rhinitis 04/25/2013    Family History  Problem Relation Age of Onset   Heart failure Mother    Asthma Mother     Alzheimer's disease Father    Hypertension Sister    Hypertension Sister    Hypertension Sister    Cancer Brother    Cancer Brother        unknown   Early death Brother        Gun Shot Wound   Cancer Brother        prostate   Prostate cancer Brother    Arthritis Son    Heart attack Neg Hx    Stroke Neg Hx    Colon cancer Neg Hx    Esophageal cancer Neg Hx    Pancreatic cancer Neg Hx    Stomach cancer Neg Hx    Liver disease Neg Hx    Rectal cancer Neg Hx     Past Surgical History:  Procedure Laterality Date   A-FLUTTER ABLATION N/A 03/24/2019   Procedure: A-FLUTTER ABLATION;  Surgeon: Waddell Danelle ORN, MD;  Location: [facility]AS[facility] LAB;  Service: Cardiovascular;  Laterality: N/A;   CARDIOVERSION N/A 12/30/2014   Procedure: CARDIOVERSION;  Surgeon: Vinie JAYSON Maxcy, MD;  Location: [facility] ENDOSCOPY;  Service: Cardiovascular;  Laterality: N/A;   FRACTURE SURGERY Left 1980's   Elbow   Left arm surgery     Left leg surgery     SHOULDER ARTHROSCOPY WITH DISTAL CLAVICLE RESECTION Left 07/05/2022   Procedure: SHOULDER ARTHROSCOPY WITH DISTAL CLAVICLE EXCISION;  Surgeon: Cristy Bonner DASEN, MD;  Location: [facility];  Service: Orthopedics;  Laterality: Left;   SHOULDER ARTHROSCOPY WITH SUBACROMIAL DECOMPRESSION, ROTATOR CUFF REPAIR AND BICEP TENDON REPAIR Left 07/05/2022   Procedure: SHOULDER ARTHROSCOPY WITH SUBACROMIAL DECOMPRESSION, ROTATOR CUFF REPAIR AND BICEP TENDON TENOTOMY;  Surgeon: Cristy Bonner DASEN, MD;  Location: [facility];  Service: Orthopedics;  Laterality: Left;   SHOULDER SURGERY     Right   Social History   Occupational History   Occupation: Disabled  Tobacco Use   Smoking status: Never   Smokeless tobacco: Never  Vaping Use   Vaping status: Never Used  Substance and Sexual Activity   Alcohol use: Not Currently    Alcohol/week: 14.0 standard drinks  of alcohol    Types: 14 Cans of beer per week   Drug use: No   Sexual activity: Not Currently

## 2024-07-06 ENCOUNTER — Other Ambulatory Visit: Payer: Self-pay | Admitting: Student in an Organized Health Care Education/Training Program

## 2024-07-06 ENCOUNTER — Encounter: Payer: Self-pay | Admitting: Radiology

## 2024-07-14 ENCOUNTER — Other Ambulatory Visit: Payer: Self-pay

## 2024-07-14 MED ORDER — APIXABAN 5 MG PO TABS: 5.0000 mg | ORAL_TABLET | Freq: Two times a day (BID) | ORAL | 5 refills | Status: AC

## 2024-07-14 NOTE — Telephone Encounter (Signed)
 Medication sent to pharmacy

## 2024-07-23 ENCOUNTER — Other Ambulatory Visit: Payer: Self-pay | Admitting: Physician Assistant

## 2024-07-23 DIAGNOSIS — I10 Essential (primary) hypertension: Secondary | ICD-10-CM

## 2024-08-07 LAB — OPHTHALMOLOGY REPORT-SCANNED

## 2024-08-13 ENCOUNTER — Other Ambulatory Visit: Payer: Self-pay

## 2024-08-13 DIAGNOSIS — K219 Gastro-esophageal reflux disease without esophagitis: Secondary | ICD-10-CM

## 2024-08-13 DIAGNOSIS — J069 Acute upper respiratory infection, unspecified: Secondary | ICD-10-CM

## 2024-08-13 MED ORDER — PANTOPRAZOLE SODIUM 40 MG PO TBEC: 40.0000 mg | DELAYED_RELEASE_TABLET | Freq: Every day | ORAL | 11 refills | Status: AC

## 2024-08-13 MED ORDER — FLUTICASONE PROPIONATE 50 MCG/ACT NA SUSP: 1.0000 | Freq: Every day | NASAL | 3 refills | Status: AC

## 2024-08-13 NOTE — Telephone Encounter (Signed)
 Medication sent to pharmacy

## 2024-08-13 NOTE — Telephone Encounter (Signed)
 Pharmacy is requesting refills to place on file  Medication sent to pharmacy.

## 2024-08-17 ENCOUNTER — Ambulatory Visit: Payer: Self-pay | Admitting: Internal Medicine

## 2024-08-17 VITALS — BP 154/73 | HR 79 | Temp 97.7°F | Wt 302.0 lb

## 2024-08-17 DIAGNOSIS — Z79891 Long term (current) use of opiate analgesic: Secondary | ICD-10-CM | POA: Diagnosis not present

## 2024-08-17 DIAGNOSIS — G4733 Obstructive sleep apnea (adult) (pediatric): Secondary | ICD-10-CM | POA: Diagnosis not present

## 2024-08-17 DIAGNOSIS — I480 Paroxysmal atrial fibrillation: Secondary | ICD-10-CM

## 2024-08-17 DIAGNOSIS — Z9181 History of falling: Secondary | ICD-10-CM | POA: Diagnosis not present

## 2024-08-17 DIAGNOSIS — I110 Hypertensive heart disease with heart failure: Secondary | ICD-10-CM | POA: Diagnosis not present

## 2024-08-17 DIAGNOSIS — Z79899 Other long term (current) drug therapy: Secondary | ICD-10-CM

## 2024-08-17 DIAGNOSIS — Z7901 Long term (current) use of anticoagulants: Secondary | ICD-10-CM | POA: Diagnosis not present

## 2024-08-17 DIAGNOSIS — Z8249 Family history of ischemic heart disease and other diseases of the circulatory system: Secondary | ICD-10-CM

## 2024-08-17 DIAGNOSIS — Z6839 Body mass index (BMI) 39.0-39.9, adult: Secondary | ICD-10-CM

## 2024-08-17 DIAGNOSIS — N521 Erectile dysfunction due to diseases classified elsewhere: Secondary | ICD-10-CM

## 2024-08-17 DIAGNOSIS — E1140 Type 2 diabetes mellitus with diabetic neuropathy, unspecified: Secondary | ICD-10-CM | POA: Diagnosis not present

## 2024-08-17 DIAGNOSIS — I5032 Chronic diastolic (congestive) heart failure: Secondary | ICD-10-CM

## 2024-08-17 DIAGNOSIS — I10 Essential (primary) hypertension: Secondary | ICD-10-CM

## 2024-08-17 MED ORDER — OXYCODONE-ACETAMINOPHEN 10-325 MG PO TABS: 1.0000 | ORAL_TABLET | Freq: Four times a day (QID) | ORAL | 0 refills | Status: AC | PRN

## 2024-08-17 MED ORDER — GABAPENTIN 300 MG PO CAPS: 300.0000 mg | ORAL_CAPSULE | Freq: Three times a day (TID) | ORAL | 5 refills | Status: AC

## 2024-08-17 NOTE — Assessment & Plan Note (Signed)
 Reviewed PDMP, requesting refill on chronic pain meds for leg and neck pain. No red flag, still reporting reflief. Will refill Oxycodone -apap 10-325 per month.

## 2024-08-17 NOTE — Assessment & Plan Note (Signed)
 Currently on Eliquis  5 mg BID, will continue.

## 2024-08-17 NOTE — Assessment & Plan Note (Signed)
 Stable. Last A1c 6.8 in 05/2024. No symptoms of hypoglycemia (lightheadedness, dizziness, LOC). Currently on metformin  1000 mg BID, will continue. POC A1c unavailable during this visit. Since A1c has been recently stable, will check A1c at follow up visit in 3 months. If A1c is elevated, will consider adding Jardiance given HFpEF and CKD stage IIIb with GFR of 39. Currently on Gabapentin  300 mg TID for neuropathy. Neuropathy well-controlled on this dose.

## 2024-08-17 NOTE — Assessment & Plan Note (Signed)
 Endorses being consistently out of breath with movement, but SOB with exertion has not gotten worse. Denies cough or increase in lower extremity swelling. Patient has not noticed increase in weight. No crackles auscultated on physical exam. No JVD. Minimal lower extremity edema. Currently on torsemide  20 mg and losartan  50 mg, will continue.

## 2024-08-17 NOTE — Assessment & Plan Note (Signed)
 Patient not able to increase physical activity due to pain and SOB at this time. Encouraged patient to think about starting Mounjaro for weight loss, diabetes management, and OSA. Patient will consider this, will follow up at next visit. Consider assessing dietary changes that could be made to help with weight loss.

## 2024-08-17 NOTE — Assessment & Plan Note (Signed)
 Poorly controlled. Initial BP 144/75. Repeat BP 154/73. Currently on losartan  50 mg and terazosin  5 mg daily. Describes minor headaches, but denies vision changes and chest pain. Patient does not check BP at home. Encouraged patient to start checking BP at home and logging them. Normotensive in clinic at previous visits. Will continue BP meds and follow up in 3 months for HTN management. At follow up visit, will check BP log and will consider increasing losartan  dose or adding another BP medication.   Plan: -continue with losartan  and terazosin   -follow up in 3 months for HTN management

## 2024-08-17 NOTE — Progress Notes (Unsigned)
 This is a Psychologist, Occupational Note.  The care of the patient was discussed with Dr. Rosan and the assessment and plan was formulated with their assistance.  Please see their note for official documentation of the patient encounter.   Subjective:   Patient ID: Juan Calderon male   DOB: 22-Mar-1956 68 y.o.   MRN: [id number]  HPI: Juan Calderon is a 68 y.o. male with past medical history of T2DM, HTN, HFpEF, paroxysmal a fib on Eliquis , OSA, CKD stage IIIB who presents to the clinic today for 3 month follow-up for diabetes and HTN management. Patient has no acute concerns today. Did endorse that he had a fall in October due to foot drop. Did not lose consciousness or hit head with fall.   Endorses being consistently out of breath with movement, but SOB with exertion has not gotten worse. Denies cough or increase in lower extremity swelling. Patient has not noticed increase in weight.   Patient endorses constantly getting headaches, but reports that these headaches are not severe and do not last long. Denies vision changes or chest pain.   Reports sleep has improved. Patient endorses improvement with snoring, and is not waking up throughout the night.     Past Medical History:  Diagnosis Date   A-fib [facility])    Alcohol abuse     Ascending aorta dilation 02/04/2023   TTE 10/2021: 42 mm  TTE 03/06/2023: EF 60-65, no RWMA, moderate LVH, normal RVSF, small pericardial effusion, RAP 3, normal aorta size (root 37 mm, ascending aorta 39 mm)   Asthma    Bradycardia    C6 radiculopathy 01/24/2016   Right upper extremity, mild to moderate electrically by EMG on 01/24/2016   Cataract    Left eye   Chronic diastolic heart failure (HCC)     with mild left ventricular hypertrophy on Echo 02/2010   Chronic kidney disease 02/28/2015   Chronic obstructive pulmonary disease (HCC)     Chronic osteomyelitis of femur (HCC) 04/06/2016   Chronic osteomyelitis of left femur (HCC) 11/22/2017   Left femur s/p  prior trauma   Chronic osteomyelitis of left femur (HCC) 11/22/2017   Brodie's abscess: left femur s/p prior trauma.  Underwent partial excision and curettage of left femoral osteomyelitis at [facility] 12/30/2017 with grossly purulent material encountered within the medullary canal of the left distal femur.  Cultures grew MSSA.  Post-operatively received 6 weeks of IV antibiotics through 02/10/2018.  CRP elevated at 60.3 at end of IV antibiotic course so continued on Keflex   Chronic pain syndrome     Left arm and leg s/p traumatic injury    Chronic renal insufficiency     Coronary artery disease     25% LAD stenosis on cath 2007.  Stable angina.   Diverticulosis     Diverticulosis 11/12/2013   Essential hypertension     Frequent PVCs    Gastroesophageal reflux disease     Gout     Hyperlipidemia LDL goal < 100     Internal hemorrhoids without complication 08/12/2018   Long-term current use of opiate analgesic 09/07/2016   Mild carpal tunnel syndrome of right wrist 01/24/2016   Mild degree electrically per EMG 01/24/2016    Mild carpal tunnel syndrome of right wrist 01/24/2016   Mild degree electrically per EMG 01/24/2016    Morbid obesity with BMI of 40.0-44.9, adult (HCC)     Normocytic anemia     NSVT (nonsustained ventricular tachycardia) (HCC)  Obstructive sleep apnea     Moderate, AHI 29.8 per hour with moderately loud snoring and oxygen  desaturation to a nadir of 79%. CPAP titration resulted in a prescription for 17 CWP.     Open-angle glaucoma     Osteoarthritis cervical spine     Osteoarthritis of left knee 06/19/2013   Tricompartmental disease.  Treated with double hinged upright knee brace, steroid/xylocaine  knee injections, and NSAIDs    Osteoporosis 05/14/2017   s/p fracture of the right humerus from a fall at ground hight   Persistent atrial fibrillation (HCC)    Right rotator cuff tear     Large full-thickness tear of the supraspinatus with mild retraction but no atrophy     Right rotator cuff tear 04/25/2013   Large full-thickness tear of the supraspinatus with mild retraction but no atrophy     Secondary male hypogonadism 02/07/2017   Likely secondary to chronic opioid use   Secondary male hypogonadism 02/07/2017   Likely secondary to chronic opioid use   Sleep apnea    Subclinical hypothyroidism     Tubular adenoma of colon 11/22/2017   Specifics unknown.  Repeat colonoscopy 08/12/2018 with six 3-6 mm tubular adenomas removed endoscopically.   Type II diabetes mellitus with neuropathy causing erectile dysfunction (HCC)     Vasomotor rhinitis 04/25/2013   Current Outpatient Medications  Medication Sig Dispense Refill   albuterol  (VENTOLIN  HFA) 108 (90 Base) MCG/ACT inhaler Inhale 1-2 puffs into the lungs every 6 (six) hours as needed for shortness of breath. 8.5 each 5   allopurinol  (ZYLOPRIM ) 300 MG tablet Take 1 tablet (300 mg total) by mouth daily. 90 tablet 3   apixaban  (ELIQUIS ) 5 MG TABS tablet Take 1 tablet (5 mg total) by mouth 2 (two) times daily. 60 tablet 5   atorvastatin  (LIPITOR) 20 MG tablet Take 1 tablet (20 mg total) by mouth daily. 90 tablet 3   benzonatate  (TESSALON  PERLES) 100 MG capsule Take 1 capsule (100 mg total) by mouth every 6 (six) hours as needed for cough. 30 capsule 0   Blood Glucose Monitoring Suppl (ONETOUCH VERIO) w/Device KIT 1 each by Does not apply route daily. 1 kit 0   Calcium  Citrate-Vitamin D  315-5 MG-MCG TABS TAKE 2 TABLETS BY MOUTH EVERY DAY 200 tablet 3   [employer] VITAMIN B12 1000 MCG tablet TAKE 1 TABLET BY MOUTH EVERY DAY 90 tablet 3   flecainide  (TAMBOCOR ) 100 MG tablet TAKE 1 TABLET BY MOUTH TWICE A DAY 180 tablet 3   fluticasone  (FLONASE ) 50 MCG/ACT nasal spray Place 1 spray into both nostrils daily. 16 mL 3   gabapentin  (NEURONTIN ) 300 MG capsule Take 1 capsule (300 mg total) by mouth 3 (three) times daily. 90 capsule 5   glucose blood (ONETOUCH VERIO) test strip Use as instructed 100 each 12   guaiFENesin -codeine   100-10 MG/5ML syrup Take 5 mLs by mouth every 6 (six) hours as needed for cough. 120 mL 1   latanoprost  (XALATAN ) 0.005 % ophthalmic solution Place 1 drop into both eyes at bedtime. 7.5 mL 2   losartan  (COZAAR ) 50 MG tablet Take 1 tablet (50 mg total) by mouth daily. 90 tablet 3   magnesium  oxide (MAG-OX) 400 (240 Mg) MG tablet Take 2 tablets (800 mg total) by mouth daily. 180 tablet 3   metFORMIN  (GLUCOPHAGE ) 1000 MG tablet Take 1 tablet (1,000 mg total) by mouth 2 (two) times daily with a meal. 180 tablet 3   nitroGLYCERIN  (NITROSTAT ) 0.4 MG SL tablet Place 1  tablet (0.4 mg total) under the tongue every 5 (five) minutes as needed for chest pain. 25 tablet 1   ONETOUCH DELICA LANCETS 33G MISC Use 1 strip daily 100 each 5   oxyCODONE -acetaminophen  (PERCOCET) 10-325 MG tablet Take 1 tablet by mouth every 6 (six) hours as needed for pain. 100 tablet 0   [START ON 09/16/2024] oxyCODONE -acetaminophen  (PERCOCET) 10-325 MG tablet Take 1 tablet by mouth every 6 (six) hours as needed for pain. 100 tablet 0   [START ON 10/16/2024] oxyCODONE -acetaminophen  (PERCOCET) 10-325 MG tablet Take 1 tablet by mouth every 6 (six) hours as needed for pain. 100 tablet 0   pantoprazole  (PROTONIX ) 40 MG tablet Take 1 tablet (40 mg total) by mouth daily. 30 tablet 11   senna (SENOKOT) 8.6 MG TABS tablet Take 2 tablets (17.2 mg total) by mouth daily. 120 tablet 2   sorbitol  70 % solution Take 30 mLs by mouth daily as needed. 480 mL 0   tadalafil  (CIALIS ) 10 MG tablet Take 1 tablet (10 mg total) by mouth as needed for erectile dysfunction. 30 tablet 1   terazosin  (HYTRIN ) 5 MG capsule Take 1 capsule (5 mg total) by mouth at bedtime. 90 capsule 3   torsemide  (DEMADEX ) 20 MG tablet Take 1 tablet (20 mg total) by mouth daily. 90 tablet 3   Wound Dressings (SORBSAN WOUND DRESSING) PADS Apply 10 each topically daily. (Patient not taking: Reported on 01/07/2024) 10 each 0   No current facility-administered medications for this visit.    Family History  Problem Relation Age of Onset   Heart failure Mother    Asthma Mother    Alzheimer's disease Father    Hypertension Sister    Hypertension Sister    Hypertension Sister    Cancer Brother    Cancer Brother        unknown   Early death Brother        Gun Shot Wound   Cancer Brother        prostate   Prostate cancer Brother    Arthritis Son    Heart attack Neg Hx    Stroke Neg Hx    Colon cancer Neg Hx    Esophageal cancer Neg Hx    Pancreatic cancer Neg Hx    Stomach cancer Neg Hx    Liver disease Neg Hx    Rectal cancer Neg Hx    Social History   Socioeconomic History   Marital status: Widowed    Spouse name: Not on file   Number of children: Not on file   Years of education: Not on file   Highest education level: Not on file  Occupational History   Occupation: Disabled  Tobacco Use   Smoking status: Never   Smokeless tobacco: Never  Vaping Use   Vaping status: Never Used  Substance and Sexual Activity   Alcohol use: Not Currently    Alcohol/week: 14.0 standard drinks of alcohol    Types: 14 Cans of beer per week   Drug use: No   Sexual activity: Not Currently  Other Topics Concern   Not on file  Social History Narrative   Current Social History 12/14/2020      Patient lives with a friend in a home which is 1 story. There are 4 steps with handrails up to the entrance the patient uses.       Patient's method of transportation is personal car.      The highest level of education was high school  diploma.      The patient currently disabled.      Identified important Relationships are Family       Pets : None       Interests / Fun: Watch TV       Current Stressors: None      Religious / Personal Beliefs: Runner, Broadcasting/film/video, RN,BSN         Social Drivers of Health   Tobacco Use: Low Risk (06/26/2024)   Patient History    Smoking Tobacco Use: Never    Smokeless Tobacco Use: Never    Passive Exposure: Not on file   Financial Resource Strain: Low Risk (05/27/2024)   Overall Financial Resource Strain (CARDIA)    Difficulty of Paying Living Expenses: Not hard at all  Food Insecurity: No Food Insecurity (05/27/2024)   Epic    Worried About Programme Researcher, Broadcasting/film/video in the Last Year: Never true    Ran Out of Food in the Last Year: Never true  Transportation Needs: No Transportation Needs (05/27/2024)   Epic    Lack of Transportation (Medical): No    Lack of Transportation (Non-Medical): No  Physical Activity: Patient Declined (05/27/2024)   Exercise Vital Sign    Days of Exercise per Week: Patient declined    Minutes of Exercise per Session: Patient declined  Stress: No Stress Concern Present (05/27/2024)   [employer] of Occupational Health - Occupational Stress Questionnaire    Feeling of Stress: Not at all  Social Connections: Socially Isolated (05/27/2024)   Social Connection and Isolation Panel    Frequency of Communication with Friends and Family: Once a week    Frequency of Social Gatherings with Friends and Family: Once a week    Attends Religious Services: Never    Database Administrator or Organizations: No    Attends Banker Meetings: Never    Marital Status: Widowed  Depression (PHQ2-9): Low Risk (08/17/2024)   Depression (PHQ2-9)    PHQ-2 Score: 0  Recent Concern: Depression (PHQ2-9) - Medium Risk (05/27/2024)   Depression (PHQ2-9)    PHQ-2 Score: 6  Alcohol Screen: Low Risk (05/27/2024)   Alcohol Screen    Last Alcohol Screening Score (AUDIT): 0  Housing: Low Risk (05/27/2024)   Epic    Unable to Pay for Housing in the Last Year: No    Number of Times Moved in the Last Year: 0    Homeless in the Last Year: No  Utilities: Not At Risk (05/27/2024)   Epic    Threatened with loss of utilities: No  Health Literacy: Adequate Health Literacy (05/27/2024)   B1300 Health Literacy    Frequency of need for help with medical instructions: Never   Review of Systems: Pertinent  items are noted in HPI. Objective:  Physical Exam: Vitals:   08/17/24 1121 08/17/24 1151  BP: (!) 144/75 (!) 154/73  Pulse: 85 79  Temp: 97.7 F (36.5 C)   TempSrc: Oral   SpO2: 96%   Weight: (!) 302 lb (137 kg)    BP (!) 154/73 (BP Location: Right Arm, Patient Position: Sitting, Cuff Size: Large)   Pulse 79   Temp 97.7 F (36.5 C) (Oral)   Wt (!) 302 lb (137 kg)   SpO2 96% Comment: RA  BMI 39.84 kg/m   General Appearance:    Alert, cooperative, no distress, appears stated age  Head:    Normocephalic, without obvious abnormality, atraumatic  Neck:  No JVD present   Lungs:     Clear to auscultation bilaterally, respirations unlabored. No crackles or wheezes   Heart:    Regular rate and rhythm, S1 and S2 normal, no murmur, rub   or gallop  Extremities:   1+ pitting edema on bilateral extremities. Scattered healed ulceration on left lower extremity, no surrounding erythema, warmth, drainage, or odor  Skin:   Decreased skin turgor   Assessment & Plan:   Assessment & Plan Essential hypertension Poorly controlled. Initial BP 144/75. Repeat BP 154/73. Currently on losartan  50 mg and terazosin  5 mg daily. Describes minor headaches, but denies vision changes and chest pain. Patient does not check BP at home. Encouraged patient to start checking BP at home and logging them. Normotensive in clinic at previous visits. Will continue BP meds and follow up in 3 months for HTN management. At follow up visit, will check BP log and will consider increasing losartan  dose or adding another BP medication.   Plan: -continue with losartan  and terazosin   -follow up in 3 months for HTN management    Chronic heart failure with preserved ejection fraction (HFpEF) (HCC) Endorses being consistently out of breath with movement, but SOB with exertion has not gotten worse. Denies cough or increase in lower extremity swelling. Patient has not noticed increase in weight. No crackles auscultated on physical  exam. No JVD. Minimal lower extremity edema. Currently on torsemide  20 mg and losartan  50 mg, will continue.     Type II diabetes mellitus with neuropathy causing erectile dysfunction (HCC) Stable. Last A1c 6.8 in 05/2024. No symptoms of hypoglycemia (lightheadedness, dizziness, LOC). Currently on metformin  1000 mg BID, will continue. POC A1c unavailable during this visit. Since A1c has been recently stable, will check A1c at follow up visit in 3 months. If A1c is elevated, will consider adding Jardiance given HFpEF and CKD stage IIIb with GFR of 39. Currently on Gabapentin  300 mg TID for neuropathy. Neuropathy well-controlled on this dose. Severe obesity with body mass index (BMI) of 35.0 to 39.9 with comorbidity (HCC) Patient not able to increase physical activity due to pain and SOB at this time. Encouraged patient to think about starting Mounjaro for weight loss, diabetes management, and OSA. Patient will consider this, will follow up at next visit. Consider assessing dietary changes that could be made to help with weight loss.     Paroxysmal atrial fibrillation (HCC) Currently on Eliquis  5 mg BID, will continue.     Obstructive sleep apnea Well controlled. Currently on CPAP. Reports sleep has improved. Patient endorses improvement with snoring, and is not waking up throughout the night. Encouraged patient to think about starting Mounjaro for weight loss, diabetes management, and OSA. Patient will consider this, will follow up at next visit.    History of recent fall Fall in October due to foot drop. Did not lose consciousness or hit head with fall. Patient following with ortho for foot drop management.      Chronic use of opiate drug for therapeutic purpose Reviewed PDMP, requesting refill on chronic pain meds for leg and neck pain. No red flag, still reporting reflief. Will refill Oxycodone -apap 10-325 per month.      Discussed and evaluated with Dr. Rosan and discussed plan with  Dr. Rosan.    Cozetta Pereyra, MS3

## 2024-08-17 NOTE — Assessment & Plan Note (Signed)
 Well controlled. Currently on CPAP. Reports sleep has improved. Patient endorses improvement with snoring, and is not waking up throughout the night. Encouraged patient to think about starting Mounjaro for weight loss, diabetes management, and OSA. Patient will consider this, will follow up at next visit.

## 2024-09-16 ENCOUNTER — Telehealth: Payer: Self-pay | Admitting: *Deleted

## 2024-09-16 ENCOUNTER — Telehealth: Payer: Self-pay

## 2024-09-16 NOTE — Telephone Encounter (Signed)
 Prior authorization has been submitted and approved. I called and spoke to the pharmacists she stated she ran the pa but she was not able to get a price. Patient is aware I advised the patient to give the pharmacy a call for a update on his rx.

## 2024-09-16 NOTE — Telephone Encounter (Signed)
 Prior Authorization for patient (oxyCODONE -Acetaminophen  10-325MG  tablets) came through on cover my meds was submitted with last office notes awaiting approval or denial.  [id number]

## 2024-09-16 NOTE — Telephone Encounter (Signed)
 Copied from CRM [id number]. Topic: Clinical - Prescription Issue >> Sep 16, 2024  2:07 PM Marda MATSU wrote: Patient  is calling because pharmacy told him the provider need to do something on his end to authorize or release the medication:   OxyCODONE -acetaminophen  (PERCOCET) 10-325 MG tablet [id number] [address], [place] - [address] AT [facility] [address] [place] [address] Phone: [phone] Fax: [phone] Hours: Not open 24 hours   Please advise.

## 2024-09-16 NOTE — Telephone Encounter (Signed)
 Juan Calderon (Key: [facility]) PA Case ID #: [id number] Need Help? Call us  at [phone] Outcome Approved today by [facility] NCPDP 2017 PA Case: [id number], Status: Approved, Coverage Starts on: 09/03/2024 12:00:00 AM, Coverage Ends on: 09/02/2025 12:00:00 AM. Questions? Contact [phone]. 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

## 2024-09-16 NOTE — Telephone Encounter (Signed)
 Thanks [employer]

## 2024-09-16 NOTE — Telephone Encounter (Signed)
 Jada can you check if pt needs a PA on Oxycodone . Thanks

## 2024-10-07 ENCOUNTER — Telehealth: Payer: Self-pay | Admitting: *Deleted

## 2024-10-07 NOTE — Telephone Encounter (Signed)
 Copied from CRM #[id number]. Topic: Clinical - Prescription Issue >> Oct 07, 2024 10:14 AM Susanna ORN wrote: Reason for CRM: Patient wants to get a message to Dr. Rosan. States he will be out of his pain medications by March 15th. He wanted to schedule appt with Dr. Rosan but he's not available at that time, therefore, he scheduled with Dr. Dorinda for March 6th.

## 2024-11-06 ENCOUNTER — Ambulatory Visit: Payer: Self-pay | Admitting: Student

## 2025-06-02 ENCOUNTER — Ambulatory Visit
